# Patient Record
Sex: Male | Born: 1945
Health system: Southern US, Community
[De-identification: ages and names within clinical notes are randomized; demographics above are authoritative.]

## PROBLEM LIST (undated history)

## (undated) DIAGNOSIS — N138 Other obstructive and reflux uropathy: Secondary | ICD-10-CM

## (undated) DIAGNOSIS — J302 Other seasonal allergic rhinitis: Secondary | ICD-10-CM

## (undated) DIAGNOSIS — M1990 Unspecified osteoarthritis, unspecified site: Secondary | ICD-10-CM

## (undated) DIAGNOSIS — I493 Ventricular premature depolarization: Secondary | ICD-10-CM

## (undated) DIAGNOSIS — J449 Chronic obstructive pulmonary disease, unspecified: Secondary | ICD-10-CM

## (undated) DIAGNOSIS — I480 Paroxysmal atrial fibrillation: Secondary | ICD-10-CM

## (undated) DIAGNOSIS — Z974 Presence of external hearing-aid: Secondary | ICD-10-CM

## (undated) DIAGNOSIS — E669 Obesity, unspecified: Secondary | ICD-10-CM

## (undated) DIAGNOSIS — Z86010 Personal history of colonic polyps: Secondary | ICD-10-CM

## (undated) DIAGNOSIS — E538 Deficiency of other specified B group vitamins: Secondary | ICD-10-CM

## (undated) DIAGNOSIS — E785 Hyperlipidemia, unspecified: Secondary | ICD-10-CM

## (undated) DIAGNOSIS — H409 Unspecified glaucoma: Secondary | ICD-10-CM

## (undated) DIAGNOSIS — Z87891 Personal history of nicotine dependence: Secondary | ICD-10-CM

## (undated) DIAGNOSIS — I350 Nonrheumatic aortic (valve) stenosis: Secondary | ICD-10-CM

## (undated) DIAGNOSIS — I509 Heart failure, unspecified: Secondary | ICD-10-CM

## (undated) DIAGNOSIS — R319 Hematuria, unspecified: Secondary | ICD-10-CM

## (undated) DIAGNOSIS — N401 Enlarged prostate with lower urinary tract symptoms: Secondary | ICD-10-CM

## (undated) DIAGNOSIS — R001 Bradycardia, unspecified: Secondary | ICD-10-CM

## (undated) DIAGNOSIS — Z8669 Personal history of other diseases of the nervous system and sense organs: Secondary | ICD-10-CM

## (undated) DIAGNOSIS — I2510 Atherosclerotic heart disease of native coronary artery without angina pectoris: Secondary | ICD-10-CM

## (undated) DIAGNOSIS — E559 Vitamin D deficiency, unspecified: Secondary | ICD-10-CM

## (undated) DIAGNOSIS — G459 Transient cerebral ischemic attack, unspecified: Secondary | ICD-10-CM

## (undated) DIAGNOSIS — C4492 Squamous cell carcinoma of skin, unspecified: Secondary | ICD-10-CM

## (undated) DIAGNOSIS — R9431 Abnormal electrocardiogram [ECG] [EKG]: Secondary | ICD-10-CM

## (undated) DIAGNOSIS — Z85828 Personal history of other malignant neoplasm of skin: Secondary | ICD-10-CM

## (undated) DIAGNOSIS — G4733 Obstructive sleep apnea (adult) (pediatric): Secondary | ICD-10-CM

## (undated) DIAGNOSIS — I10 Essential (primary) hypertension: Secondary | ICD-10-CM

## (undated) DIAGNOSIS — R972 Elevated prostate specific antigen [PSA]: Secondary | ICD-10-CM

## (undated) DIAGNOSIS — Z9989 Dependence on other enabling machines and devices: Secondary | ICD-10-CM

## (undated) DIAGNOSIS — N400 Enlarged prostate without lower urinary tract symptoms: Secondary | ICD-10-CM

## (undated) DIAGNOSIS — I219 Acute myocardial infarction, unspecified: Secondary | ICD-10-CM

## (undated) DIAGNOSIS — J45909 Unspecified asthma, uncomplicated: Secondary | ICD-10-CM

## (undated) HISTORY — DX: Hematuria, unspecified: R31.9

## (undated) HISTORY — DX: Personal history of nicotine dependence: Z87.891

## (undated) HISTORY — DX: Abnormal electrocardiogram (ECG) (EKG): R94.31

## (undated) HISTORY — DX: Hyperlipidemia, unspecified: E78.5

## (undated) HISTORY — DX: Benign prostatic hyperplasia without lower urinary tract symptoms: N40.0

## (undated) HISTORY — DX: Vitamin D deficiency, unspecified: E55.9

## (undated) HISTORY — DX: Elevated prostate specific antigen (PSA): R97.20

## (undated) HISTORY — DX: Dependence on other enabling machines and devices: Z99.89

## (undated) HISTORY — DX: Obstructive sleep apnea (adult) (pediatric): G47.33

## (undated) HISTORY — DX: Obesity, unspecified: E66.9

## (undated) HISTORY — PX: COLONOSCOPY: SHX174

## (undated) HISTORY — DX: Essential (primary) hypertension: I10

## (undated) HISTORY — PX: CARDIAC CATHETERIZATION: SHX172

## (undated) HISTORY — PX: CATARACT EXTRACTION W/ INTRAOCULAR LENS IMPLANT: SHX1309

## (undated) HISTORY — DX: Personal history of colonic polyps: Z86.010

---
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
## 1998-08-30 ENCOUNTER — Inpatient Hospital Stay (HOSPITAL_COMMUNITY): Admission: EM | Admit: 1998-08-30 | Discharge: 1998-08-31 | Payer: Self-pay | Admitting: Emergency Medicine

## 1998-08-30 ENCOUNTER — Encounter: Payer: Self-pay | Admitting: Emergency Medicine

## 1998-08-31 ENCOUNTER — Encounter: Payer: Self-pay | Admitting: Pulmonary Disease

## 1999-08-19 HISTORY — PX: CHOLECYSTECTOMY: SHX55

## 1999-08-31 ENCOUNTER — Encounter: Payer: Self-pay | Admitting: Emergency Medicine

## 1999-08-31 ENCOUNTER — Encounter: Payer: Self-pay | Admitting: Rheumatology

## 1999-08-31 ENCOUNTER — Inpatient Hospital Stay (HOSPITAL_COMMUNITY): Admission: EM | Admit: 1999-08-31 | Discharge: 1999-09-05 | Payer: Self-pay | Admitting: Emergency Medicine

## 1999-12-14 ENCOUNTER — Encounter: Payer: Self-pay | Admitting: Emergency Medicine

## 1999-12-14 ENCOUNTER — Inpatient Hospital Stay (HOSPITAL_COMMUNITY): Admission: EM | Admit: 1999-12-14 | Discharge: 1999-12-16 | Payer: Self-pay | Admitting: Emergency Medicine

## 1999-12-15 ENCOUNTER — Encounter: Payer: Self-pay | Admitting: Rheumatology

## 2000-02-24 ENCOUNTER — Encounter: Payer: Self-pay | Admitting: Surgery

## 2000-02-26 ENCOUNTER — Encounter: Payer: Self-pay | Admitting: Surgery

## 2000-02-26 ENCOUNTER — Ambulatory Visit (HOSPITAL_COMMUNITY): Admission: RE | Admit: 2000-02-26 | Discharge: 2000-02-27 | Payer: Self-pay | Admitting: Surgery

## 2000-02-26 ENCOUNTER — Encounter (INDEPENDENT_AMBULATORY_CARE_PROVIDER_SITE_OTHER): Payer: Self-pay | Admitting: Specialist

## 2000-02-26 HISTORY — PX: LAPAROSCOPIC CHOLECYSTECTOMY: SUR755

## 2000-02-29 ENCOUNTER — Encounter: Payer: Self-pay | Admitting: General Surgery

## 2000-02-29 ENCOUNTER — Inpatient Hospital Stay (HOSPITAL_COMMUNITY): Admission: EM | Admit: 2000-02-29 | Discharge: 2000-03-04 | Payer: Self-pay | Admitting: *Deleted

## 2000-02-29 ENCOUNTER — Encounter: Payer: Self-pay | Admitting: *Deleted

## 2000-03-01 ENCOUNTER — Encounter: Payer: Self-pay | Admitting: General Surgery

## 2000-03-04 ENCOUNTER — Encounter: Payer: Self-pay | Admitting: Surgery

## 2003-03-08 ENCOUNTER — Encounter: Payer: Self-pay | Admitting: Internal Medicine

## 2003-03-08 ENCOUNTER — Encounter: Admission: RE | Admit: 2003-03-08 | Discharge: 2003-03-08 | Payer: Self-pay | Admitting: Internal Medicine

## 2006-07-27 ENCOUNTER — Emergency Department (HOSPITAL_COMMUNITY): Admission: EM | Admit: 2006-07-27 | Discharge: 2006-07-27 | Payer: Self-pay | Admitting: Emergency Medicine

## 2008-12-14 ENCOUNTER — Encounter: Payer: Self-pay | Admitting: Cardiology

## 2008-12-21 ENCOUNTER — Ambulatory Visit: Payer: Self-pay | Admitting: Cardiology

## 2008-12-21 DIAGNOSIS — E669 Obesity, unspecified: Secondary | ICD-10-CM | POA: Insufficient documentation

## 2008-12-21 DIAGNOSIS — E785 Hyperlipidemia, unspecified: Secondary | ICD-10-CM

## 2008-12-21 DIAGNOSIS — R9431 Abnormal electrocardiogram [ECG] [EKG]: Secondary | ICD-10-CM | POA: Insufficient documentation

## 2008-12-21 DIAGNOSIS — I10 Essential (primary) hypertension: Secondary | ICD-10-CM | POA: Insufficient documentation

## 2008-12-21 HISTORY — DX: Essential (primary) hypertension: I10

## 2008-12-21 HISTORY — DX: Hyperlipidemia, unspecified: E78.5

## 2008-12-28 ENCOUNTER — Ambulatory Visit: Payer: Self-pay

## 2008-12-28 ENCOUNTER — Ambulatory Visit: Payer: Self-pay | Admitting: Cardiology

## 2010-02-25 HISTORY — PX: LAPAROSCOPIC CHOLECYSTECTOMY: SUR755

## 2010-09-17 NOTE — Assessment & Plan Note (Signed)
Summary: np6/abnormal ekg/jml   Primary Provider:  Dr. Duanne Guess  CC:  Abnormal EKG.  History of Present Illness: The patient presents for evaluation of an abnormal EKG. He has no prior cardiac history he reports being hospitalized about 10 years ago for some atypical chest pain. He was told he had a large heart but does not describe his heart failure. He says he did not have a heart attack and he doesn't recall exactly but he says he thinks he had a stress test. He has had no problems since that time.  Recently he was on a cruise. He developed some nausea, was slightly overheated, had some abdominal discomfort and apparently had a frank syncopal episode. He said everything went gray and he went down. He did not have any palpitations or chest discomfort. Evaluated by the ship doctor.  He went to see Dr.Dewey recently and described this episode. An EKG did demonstrate rare PVCs and a possible old inferior infarct. There were no acute ST-T wave changes and there were no old EKGs for comparison.  The patient says that he feels relatively well. He can work in his yard and do aggressive activities such as cutting down trees without any chest pressure, neck or arm discomfort. He does not describe palpitations, presyncope or syncope. He has no PND or orthopnea. He has no excessive exercise and diaphoresis or nausea or vomiting.  Current Medications (verified): 1)  None  Allergies (verified): No Known Drug Allergies  Past History:  Past Medical History:    HTN x 2 years    Boderline Cholesterol  Past Surgical History:    Cholecystectomy  Family History:    No early heart disease. His 71 year old mother has hypertension and a 84 year old sister has hyperlipidemia.  Social History:    He is retired. He is married with 2 children. He smoked 2 packs per day for 60 years but quit in the 70s.  Review of Systems       As stated in the HPI and negative for all other systems.   Vital Signs:   Patient profile:   65 year old male Height:      70 inches Weight:      248 pounds BMI:     35.71 Pulse rate:   72 / minute Resp:     18 per minute BP sitting:   149 / 90  (right arm)  Nutrition Counseling: Patient's BMI is greater than 25 and therefore counseled on weight management options.  Physical Exam  General:  Well developed, well nourished, in no acute distress. Head:  normocephalic and atraumatic Eyes:  PERRLA/EOM intact; conjunctiva and lids normal. Mouth:  Teeth, gums and palate normal. Oral mucosa normal. Neck:  Neck supple, no JVD. No masses, thyromegaly or abnormal cervical nodes. Chest Wall:  no deformities or breast masses noted Lungs:  Clear bilaterally to auscultation and percussion. Abdomen:  Bowel sounds positive; abdomen soft and non-tender without masses, organomegaly, or hernias noted. No hepatosplenomegaly, obese Msk:  Back normal, normal gait. Muscle strength and tone normal. Pulses:  pulses normal in all 4 extremities Extremities:  No clubbing or cyanosis. Neurologic:  Alert and oriented x 3. Skin:  Intact without lesions or rashes. Cervical Nodes:  no significant adenopathy Axillary Nodes:  no significant adenopathy Inguinal Nodes:  no significant adenopathy Psych:  Normal affect.   Detailed Cardiovascular Exam  Neck    Carotids: Carotids full and equal bilaterally without bruits.      Neck Veins: Normal, no  JVD.    Heart    Inspection: no deformities or lifts noted.      Palpation: normal PMI with no thrills palpable.      Auscultation: regular rate and rhythm, S1, S2 without murmurs, rubs, gallops, or clicks.    Vascular    Abdominal Aorta: no palpable masses, pulsations, or audible bruits.      Femoral Pulses: normal femoral pulses bilaterally.      Pedal Pulses: normal pedal pulses bilaterally.      Radial Pulses: normal radial pulses bilaterally.      Peripheral Circulation: no clubbing, cyanosis, or edema noted with normal capillary  refill.     Impression & Recommendations:  Problem # 1:  ELECTROCARDIOGRAM, ABNORMAL (ICD-794.31) The patient does have an EKG which is nonspecific but could suggest an old inferior infarct. I think the pretest probability of coronary disease lobe. However, he does not have a particularly healthy lifestyle. He has cardiovascular risk factors. Screening with an exercise treadmill is prudent. This will allow me to exclude obstructive coronary disease, risk stratify and most importantly give him a prescription for exercise or prevented measures. Orders: Treadmill (Treadmill)  Problem # 2:  OBESITY, UNSPECIFIED (ICD-278.00) We discussed this and I will give him specific recommendations at the time of the treadmill.  Problem # 3:  ESSENTIAL HYPERTENSION, BENIGN (ICD-401.1) His blood pressure is slightly elevated. However, he was given a prescription for Micardis and he will start this.  Problem # 4:  HYPERLIPIDEMIA (ICD-272.4) Per Dr. Duanne Guess  Patient Instructions: 1)  Your physician has requested that you have an exercise tolerance test.  For further information please visit [email or web address]  Please also follow instruction sheet, as given. 2)  Your physician recommends that you continue on your current medications as directed. Please refer to the Current Medication list given to you today.

## 2010-09-17 NOTE — Letter (Signed)
Summary: [facility]  [facility]   Imported By: Sherian Rein 01/30/2009 08:06:41  _____________________________________________________________________  External Attachment:    Type:   Image     Comment:   External Document

## 2010-09-17 NOTE — Assessment & Plan Note (Signed)
Summary: [facility] W/Yasheka Fossett   Exercise Tolerance Test Cardiovascular Risk History:      Positive major cardiovascular risk factors include male age over 66 years old, hyperlipidemia, and hypertension.  Negative major cardiovascular risk factors include no history of diabetes, negative family history for ischemic heart disease, and non-tobacco-user status.        Further assessment for target organ damage reveals no history of ASHD, cardiac end-organ damage (CHF/LVH), stroke/TIA, peripheral vascular disease, renal insufficiency, or hypertensive retinopathy.    Baseline EKG:    Rhythm:     l sinus    Rate:       70    PR:       .15    QRS:       .09    QT:       .39    QTc:       .42  Exercise Tolerance Test Results:    Ordering MD:        Dr. Angelina Sheriff    Interpreting MD:     Dr. Angelina Sheriff    Indication for ETT:     abnormal ekg    Contraindication to ETT:   no    Stress Modality:     exercise-treadmill    Cardiac Imaging Performed:   none    Protocol:       Standard Bruce-maximal    Maximum BP:        211 / 83    MPHR (bpm):        157    85% MPHR (bpm):     133    MHR obtained (bpm):        148    Reached 85% MPHR       (min:sec):       5:40    Total Exercise Time       (min:sec):       7:30    Workload in METS:     9.3    Borg Scale:       14    ST Segment analysis:       At Rest:       normal ST segments-no evidence of significant ST depression       With Exercise:     no evidence of significant ST depression       Max. ST segment deviation          (during exercise or rest):   1 mm          Description:       upsloping          Leads:       inferior (II-III & AVF)    Arrhythmia:             yes    Arrhythmia description:   PVCs at peak exercise    Angina during ETT:     absent (0)  Cardiovascular Risk Assessment/Plan:      The patient's hypertensive risk group is category B: At least one risk factor (excluding diabetes) with no target organ damage.  Today's blood  pressure is 145/80.  His blood pressure goal is < 140/90.  Exercise Tolerance Test Assessment:    Quality of ETT:   diagnostic    ETT Interpretation:   normal-no evidence of ischemia by ST analysis    Comments:     The patient had a moderate exercise tolerance.  He had dyspnea but no chest pain.  There  few PVCs and couplets at peak exercise and during early recovery. There were no ischemic ST T wave changes.  He had an accelerated BP response.  He had a normal heart rate recovery.    Recommendations:   Negative adequate ETT.  No further cardiovascular testing is indicated.  He needs aggressive primary risk reduction.  Based on this test I gave him a presrciption for exercise.  He needs continued follow up of his BP per Dr. Duanne Guess.

## 2010-11-20 DIAGNOSIS — J309 Allergic rhinitis, unspecified: Secondary | ICD-10-CM | POA: Insufficient documentation

## 2010-11-20 DIAGNOSIS — E785 Hyperlipidemia, unspecified: Secondary | ICD-10-CM | POA: Insufficient documentation

## 2010-11-20 DIAGNOSIS — H9190 Unspecified hearing loss, unspecified ear: Secondary | ICD-10-CM | POA: Insufficient documentation

## 2010-11-26 DIAGNOSIS — E559 Vitamin D deficiency, unspecified: Secondary | ICD-10-CM

## 2010-11-26 HISTORY — DX: Vitamin D deficiency, unspecified: E55.9

## 2011-01-03 NOTE — H&P (Signed)
[place]. [facility]  Patient:    Eric Stokes                    MRN: [id number] Adm. Date:  [id number] Attending:  Genene Churn CC:         Darius Bump, M.D.                         History and Physical  HISTORY OF PRESENT ILLNESS:  The patient is a 65 year old male with a chief complaint of "severe stomach cramps," who is admitted with acute pancreatitis ?etiology for further evaluation and treatment.  PROBLEM: #1 - ACUTE PANCREATITIS ?ETIOLOGY:  On August 31, 1999, about 10:30 to 11 a.m., onset of abdominal cramps, constant upper mid abdomen, right and left upper abdomen.  Pain ranged between 5-9/10.  There was nausea and he vomited 15-20 times, initially food and Pepto-Bismol that he took and then clear material, no blood. No diarrhea.  Solid bowel movement at 0900 this morning that was brown.  He had belching.  He also took an Advil, no help.  No chest pain, shortness of breath, not dizzy.  Nonsmoker.  The patient called and I directed him to the [facility] ER initially to be seen by ER physician.  In the [facility] ER, amylase was 2142.  History of drinking alcohol socially, -2 beers every other day (had three beers on January 12 in the evening).  No history of gallstone or injury to the abdomen.  History of hyperlipidemia.  Patient was  given Demerol and Compazine in the ER and abdominal pain and nausea are improved with the pain now 5/10.  I elected to admit for further evaluation and treatment. Ultrasound of abdomen is to be done this evening.  No history of DTs.  No previous history of pancreatitis.  PAST MEDICAL HISTORY:  ALLERGIES:  None.  MEDICINES:  None.  OPERATIONS:  Status post tonsillectomy.  INJURIES:  Fractured toes and fingers with sports.  Has bruised left forehead from a crowbar (works [employer]), August 30, 1999.  MEDICAL: 1. History of hyperlipidemia. 2. History of hypertension under stress  and no treatment.  FAMILY/SOCIAL HISTORY:  Married, two children.  Wife is in emergency room.  One  child is an EMT in the ER, another is a Engineer, civil (consulting) in the ER.  Patient does not smoke, drinks beer socially.  Works Program[employer], [employer].  Family history of emphysema.  REVIEW OF SYSTEMS:  History of headache two days ago that resolved.  All other systems negative.  PHYSICAL EXAMINATION:  GENERAL:  Alert, no acute distress.  VITAL SIGNS:  I did O2 saturation, 97 on room air.  Temperature 97.3, initial blood pressure 162/82, pulse 82, respiratory rate 24.  SKIN:  Tattoo, right and left forearm, upper back, and left lower leg.  Skin was not sweaty and there was no rash.  HEENT:  Eyes:  Conjunctivae pink, sclerae white, pupils were very small.  They probably reacted to light, but could not see fundi well.  ENT remarkable for dry mucous membranes.  NECK:  Supple, thyroid not enlarged.  No bruit, no adenopathy.  LUNGS:  Clear to percussion and auscultation.  There was no CVA tenderness.  HEART:  Regular rhythm without murmur, gallop, or rub.  ABDOMEN:  Moderate tenderness, upper mid abdomen, right and left upper abdomen o palpation, but no rebound.  Bowel sounds normal active.  No gross organomegaly, was difficult to  tell because of tenderness in upper abdomen.  GENITOURINARY:  Normal male genitalia.  RECTAL:  Prostate not enlarged.  There was no stool in the vault.  Smudge on glove negative for blood.  EXTREMITIES:  No edema.  Pulses intact.  Joints:  No synovitis.  NEUROLOGICAL:  No focal neurological findings.  DATABASE:  Ultrasound of the abdomen, EKG, lipase, and PTT are pending.  White count 8500, hemoglobin 16.0, platelet count 195,000.  C-MET remarkable for glucose 126, SGOT 191, SGPT 138, total bilirubin 1.2.  Amylase 2142.  PT/INR 1.1.  IMPRESSION:  Acute pancreatitis ?etiology, rule out alcohol, rule out gallstone, rule out hyperlipidemia  ?other.  PLAN: 1. Admit, discuss with patient, wife, and two daughters. 2. N.p.o. and IV fluids. 3. Ultrasound of gallbladder, lipase, EKG, and PTT pending. 4. Demerol for pain and Phenergan for nausea.  Discussed may need NG tube if    has protracted vomiting. 5. On September 01, 1999, will get CBC, C-MET, amylase, lipase, and add magnesium,    cholesterol, and triglyceride.  See order sheet for details.  DD:  08/31/99 TD:  08/31/99 Job: [id number]

## 2011-01-03 NOTE — H&P (Signed)
[place]. [facility]  Patient:    Eric Stokes                    MRN: [id number] Adm. Date:  [id number] Attending:  Genene Churn                         History and Physical  ADDENDUM THREE-WAY ABDOMEN:  Bilateral atelectasis.  Distended small bowel on right, no air-fluid level, ? ileus or early obstruction. DD:  08/31/99 TD:  08/31/99 Job: [id number]

## 2011-01-03 NOTE — Discharge Summary (Signed)
[place]. [facility]  Patient:    Eric Stokes, Eric Stokes                   MRN: [id number] Adm. Date:  [id number] Disc. Date: [id number] Attending:  Glenna Fellows [place]                           Discharge Summary  ACCOUNT NUMBER [id number]  FINAL DIAGNOSES: 1. Bilious post laparoscopic cholecystectomy. 2. Cellulitis of right flank, superficial, questionable etiology. 3. Cellulitis of both upper extremities secondary to IVs.  CLINICAL HISTORY:  [name] is a 65 year old gentleman who had a prior history of pancreatitis thought to be biliary in origin.  He had done well and was admitted electively for laparoscopic cholecystectomy which was accomplished three days prior to this admission.  He did well and was discharged the next day.  He has returned and was admitted with abdominal distention, bloating, mild diffuse abdominal pain and cramping, some nausea and vomiting, and after admission, some diarrhea.  On exam, he had moderately distended abdomen.  His temperature was 100.  he had some diffuse tenderness with some guarding.  There was no evidence of any wound infections.  White count was 12,500.  Amylase and lipase were normal.  The patient was admitted with an initial diagnosis of ileus, rule out bile leak.  He began having frequent stools after he got in and passing gas, feeling better, and his HIDA scan was negative for leak.  The next day, he was feeling somewhat better, denying pain but still having multiple loose stools.  His white count had come down to 9200.  Abdominal films were consistent with an ileus.  Because of the diarrhea, C. difficile toxins were checked but were negative on final reports.  On July 16, he was feeling some better but continued to have a lot of liquid stools.  His abdomen was soft and nontender.  His wounds looked okay.  We elected, because he continued not to be well, to check a CT scan.  We gradually advanced  his diet on July 16 to full liquids.  The next day, his diarrhea was slowing down but still having frequent stools. His C. difficile had been negative x 3.  His potassium was 3.3.  It was felt that he had some form of an ileus resolving.  We tried him on a little cholestyramine which he could not tolerate because of the taste.  On July 17, he noticed some right upper extremity pain and redness and had an area where an IV had been placed a few days earlier.  Heat pack was applied. On July 17, he was feeling better, but at that point on exam, he had an area of erythema and induration on the right flank lateral to his more lateral trocar site.  CBC was negative and at that point got the CT scan which showed some mild inflammatory changes in the abdominal wall, uncertain etiology. There was no evidence of any intra-abdominal abscess or problem.  That evening, he felt okay and wanted to go home.  He was afebrile.  He was also developing an area on the left arm above an IV site that appeared to be developing some cellulitis/phlebitis.  The right flank had not gotten any worse, and it was decided at this point to discharge him on Augmentin 875 mg p.o. b.i.d., solid diet, activities as tolerated, and to follow up in my office in  a couple of days with monitoring at home of his fever and the areas of cellulitis.  His daughter was a Engineer, civil (consulting) and was well able to follow these, and they felt comfortable being discharged for outpatient management. DD:  03/19/00 TD:  03/21/00 Job: [id number]

## 2011-01-03 NOTE — H&P (Signed)
[place]. [facility]  Patient:    Eric Stokes                    MRN: [id number] Adm. Date:  [id number] Disc. Date: [id number] Attending:  Genene Churn CC:         Darius Bump, M.D.                         History and Physical  The patient is a 65 year old right-handed white male patient of Dr. Darius Bump.  CHIEF COMPLAINT:  "My upper stomach/pancreas."  Admitted with acute pancreatitis ?etiology for further evaluation and treatment.  PROBLEM: Acute pancreatitis ?etiology.  January 2001 patient admitted with acute pancreatitis.  Using alcohol socially at that time.  Ultrasound consistent with pancreatitis.  Abnormal liver tests. He has had no alcohol since that admission.  History of hyperlipidemia. Triglyceride 96 during that admission. Patient had mild hyperglycemia that subsequently returned to normal with a hemoglobin A1C later checked at 5.1.  On December 14, 1999 about 11 a.m. after eating breakfast he had a discomfort in the entire abdomen and the abdomen entire was bloated.  Took some Tums and there was less bloating.  Gas pain in his lower mid abdomen.  He had belching. He had a solid bowel movement earlier that morning that was brown.  He then developed pain in the upper mid abdomen.  Initially treated 4/10, then went to 6/10.  He had nausea and vomiting of beige material.  Patient called me after vomiting about four times and suggested coming to the emergency room but he declined.  He wanted to try Tums again which he did and vomited and subsequently came to the emergency room.  He vomited, he stated, about six times, the last about 5:30 p.m. which is about four hours ago.  In the emergency room temperature 97.8.  Blood pressure 107/61.  Pulse 84. Respiratory rate 28.  He was given morphine sulfate 4 mg with Phenergan 25 mg and subsequently 5 mg of morphine sulfate and has less pain now.  Still has some nausea, but feels  sluggish.  Amylase 107.3, lipase 1456, bilirubin 2.1, SGOT2 302, SGPT 211.  Patient was seen by Dr. Colon Branch and I was called and elected to admit for further evaluation of acute pancreatitis.  No injury to the abdomen.  No history of kidney stone.  No chest pain or shortness of breath.  Patient had normal calcium during January 2001 admission.  ALLERGIES:  None.  MEDICATIONS:  Allegra and Flonase.  PAST SURGICAL HISTORY:  Status post tonsillectomy.  INJURIES:  Status post fracture to toes and fingers various times, sports injuries.  Bruised forehead January 2001-crowbar.  PAST MEDICAL HISTORY: 1. Hyperlipidemia-diet. 2. Hypertension under stress, no treatment. 3. Status post acute pancreatitis January 2001.  See HPI. 4. History of allergies on Allegra and Flonase.  SOCIAL HISTORY:  Married.  Two children.  Does not smoke.  Does not drink alcohol.  Works in [employer].  FAMILY HISTORY:  Emphysema.  REVIEW OF SYSTEMS:  All other systems negative.  PHYSICAL EXAMINATION:  GENERAL:  Alert, but tired looking.  Had to receive morphine sulfate.  VITAL SIGNS:  Temperature 97.8.  Blood pressure 107/61.  Pulse 84. Respiratory rate 28.  Subsequent O2 saturation 97 per nurse.  SKIN:  Tattoos right and left arm and left lower extremity.  Not sweating.  No rash.  HEENT:  Eyes:  Conjunctiva:  Pink.  Sclerae:  White.  Pupils:  Small.  Left fundus:  Grossly normal.  I could not see right fundus well.  Ears: Otoscopic:  Normal.  Nose:  Nasal mucosa:  Normal.  Mouth and throat:  Dry mucous membranes.  LUNGS:  Clear to percussion and auscultation.  HEART:  Regular rhythm without murmur, gallop, or rub.  NECK:  Supple.  Thyroid not enlarged.  No bruit.  No CVA tenderness.  ABDOMEN:  Moderate tenderness mid epigastric greater in right and left upper quadrant.  Bowel sounds normoactive.  No gross organomegaly.  GENITOURINARY:  Normal male genitalia.  RECTAL:  Patient declines.   Recognizes I can not test for blood in stool (wanted to do this because of his abdominal pain, although he does not have a history of melena).  Will ask nurse to check for stool for blood is available.  EXTREMITIES:  No edema.  Pulses intact.  NEUROLOGIC:  No gross focal neurological findings.  JOINT:  No synovitis.  DATABASE:  One view abdomen:  Distended small bowel, right lower quadrant similar to prior study-probable ileus.  White count 12,800, hemoglobin 15.3.  PT and PTT normal.  CMET remarkable for glucose 120, SGOT 302, SGPT 211, total bilirubin 2.1, amylase 1073, lipase 1456.  IMPRESSION:  Acute pancreatitis ?etiology.  PLAN: 1. Admit.  Discussed with patient, wife, daughter, nurse. 2. Acute pancreatitis.  IV fluids, Demerol, n.p.o., Phenergan for nausea.    Ultrasound of gallbladder and pancreas.  Rule out gallbladder disease. 3. See order sheet for details. 4. EKG and urinalysis pending.  Ultrasound of abdomen pending. DD:  12/14/99 TD:  12/14/99 Job: [id number]

## 2011-01-03 NOTE — Op Note (Signed)
[place]  Patient:    PAXON, PROPES                   MRN: [id number] Proc. Date: 02/26/00 Adm. Date:  [id number] Attending:  Charlton Haws CC:         Darius Bump, M.D.             Roosvelt Harps, M.D.                           Operative Report  CCS#: [id number]  PREOPERATIVE DIAGNOSIS:  Chronic calculus, cholecystitis with history of recurring episodes of pancreatitis.  POSTOPERATIVE DIAGNOSIS:  Chronic calculus, cholecystitis with history of recurring episodes of pancreatitis.  OPERATION:  Laparoscopic cholecystectomy with operative cholangiogram.  SURGEON:  Currie Paris, M.D.  ASSISTANT:  Gita Kudo, M.D.  ANESTHESIA:  General endotracheal.  CLINICAL HISTORY:  This patient is a 65 year old man, who has had recurring episodes of pancreatitis and who has sludge in his gallbladder on ultrasound.  DESCRIPTION OF PROCEDURE:  Patient brought to the operating room and after satisfactory general endotracheal anesthesia had been obtained, the abdomen was prepped and draped.  Marcaine 0.25% was used at each incision.  An umbilical incision was made and the fascia picked up and the peritoneal cavity entered direct vision.  Hasson was introduced and the abdomen insufflated to 15.  The patient was placed in reverse Trendelenburg and three additional cannulae were placed in the usual positions.  There was a fair amount of fatty tissue over the gallbladder was dissected off until I could identify the cystic duct and the arch junction with the gallbladder.  I followed it down. There was a lot of fatty tissue overlying the common duct and was I was sure I was at the cystic duct/gallbladder junction I did not trace this any further. I opened up the peritoneum on either side to expose the Triangle of Calot a little bit better.  I could see one branch of the cystic artery sitting just behind the cystic duct and could see  that clearly no other structures in this area.  Cystic duct was clipped near the gallbladder and opened.  A 14 angiocath was used to placed a Redic catheter through the abdominal wall and an intraoperative cholangiography done.  There was prompt filling of the duodenum, good filling of the common duct and common hepatic duct and intrahepatic ducts and no evidence of any filling defects.  The pancreatic duct did fill a little bit.  The cystic duct catheter was removed and two clips were placed on the stay side and three on the first branch of the artery that we identified and cystic duct was divided and then the artery divided, leaving two clips on the stay side and one on the go side.  I little further dissection revealed a second artery, which was clipped and divided. Gallbladder was removed from below to above.  It was fairly thin-walled and distended and we did have a little bit of bile leakage out as we were working. Once the gallbladder was just about disconnected, we irrigated and made sure everything was dry along the bed of the gallbladder.  The gallbladder was then disconnected and put in a bag and brought out the umbilical port.  I had to decompress the gallbladder to get it out the umbilicus, but it came out readily.  The abdomen was reinsufflated and as the  umbilicus incision was digitally occluded, we irrigated with about a liter of saline to make sure all of the bile spillage had been diluted out and that there had been no bleeding.  The lateral ports were removed and there was no bleeding from those sites. The umbilical port was closed with a purse-string that had been placed at the beginning of the case.  The abdomen was deflated through the epigastric port. Skin incisions were closed with a 4-0 Monocryl subcuticular, plus Steri-Strips.  The patient tolerated the procedure well.  There were no operative complications.  All counts were correct. DD:  02/26/99 TD:   02/26/00 Job: [id number]

## 2011-01-03 NOTE — Discharge Summary (Signed)
[place]  Patient:    Eric Stokes, Eric Stokes                   MRN: [id number] Adm. Date:  [id number] Disc. Date: 12/16/99 Attending:  Genene Churn                           Discharge Summary  DATE OF BIRTH:  October 12, 1945  ADMITTING DIAGNOSES:  Acute pancreatitis.  DISCHARGE DIAGNOSES: 1. Acute pancreatitis. 2. Seasonal allergies.  HISTORY OF PRESENT ILLNESS AND HOSPITAL COURSE:  Patient is a 65 year old Caucasian male patient of Dr. Madison Hickman who was admitted December 14, 1999, following a history of developing abdominal pain and bloating late in the morning on admission.  Patient had taken some antacids with mild improvement in the bloating but, then followed with nausea and vomiting x 4 and increased abdominal pain for which she eventually presented to the emergency room. Laboratory data in the emergency room showed an amylase of 1073, lipase of 1456, bilirubin 2.1, AST 302, ALT 211.  Alkaline phosphatase was in the normal range.  Pro time was normal.  His white count was 12.8 with 89% neutrophils, 5% lymphs, 4% monos, hemoglobin 15.3, hematocrit 42.9, with a platelet count of 211.  A calcium was in the normal range at 8.9.  With regards to history, the patient had been admitted in January, 2001, with acute pancreatitis, with the concern for alcohol as the etiology.  He had, however, completely discontinued his alcohol use following that hospitalization and had gone on a low cholesterol diet for weight loss.  He states he has lost 20 pounds since his last admission.  He does have a history of hyperlipidemia.  However, triglyceride in January was 96.  PHYSICAL EXAMINATION:  VITAL SIGNS:  Stable.  Patient was afebrile.  ABDOMEN:  Exam showed moderate tenderness in the mid epigastric area, right more so than the left.  Bowel sounds were normoactive.  Abdominal films showed changes consistent with an adynamic ileus.  HOSPITAL  COURSE:  The patient was admitted, placed on IV fluids, received IV pain medication and antiemetics, made n.p.o., and hydrated.  With regards to his pancreatitis, he has improved rapidly over the ensuing two days.  He states, today, he is with minimal discomfort.  He has a significant appetite. He has tolerated clear liquids for the past 24 hours and is ready to go home. He has had no vomiting or nausea in the last 48 hours from what I can gather from his chart.  Amylase and lipase have also improved significantly.  Today, levels are at 417 and 400 respectively.  AST is now down to 40 and ALT has decreased to 115.  In evaluation of his pancreatitis, triglycerides returned at 59.  An ultrasound of the abdomen showed changes consistent with pancreatitis with an enlarged pancreas, also hepatic changes consistent with probable fatty liver disease, and no ductal dilatation.  Gallbladder appeared normal.  The patients diet has been advanced to regular low-cholesterol diet today and he has tolerated breakfast well, and is currently eating a good lunch without increased abdominal discomfort or nausea.  The patient states that he is not taking any other medications at home save for Allegra for his seasonal allergies.  The patient is thus discharged today in stable condition. He is to continue his Allegra 60 mg p.o. b.i.d. and Flonase two sprays each nostril once daily as needed for  allergies.  Otherwise, he is to call Dr. Ray Church office for follow-up in one to two weeks or sooner if he redevelops abdominal discomfort.  Again, it was stressed to him that he should continue to avoid alcohol and continue with his low-cholesterol diet and weight loss so as not to precipitate more frequent episodes.  At this time, his pancreatitis is deemed idiopathic. DD:  12/16/99 TD:  12/16/99 Job: [id number]

## 2011-09-16 DIAGNOSIS — R319 Hematuria, unspecified: Secondary | ICD-10-CM

## 2011-09-16 HISTORY — DX: Hematuria, unspecified: R31.9

## 2012-01-21 ENCOUNTER — Other Ambulatory Visit: Payer: Self-pay | Admitting: Dermatology

## 2012-05-31 ENCOUNTER — Other Ambulatory Visit: Payer: Self-pay | Admitting: Dermatology

## 2012-09-03 ENCOUNTER — Encounter: Payer: Self-pay | Admitting: Internal Medicine

## 2012-09-03 ENCOUNTER — Ambulatory Visit (INDEPENDENT_AMBULATORY_CARE_PROVIDER_SITE_OTHER): Payer: Medicare Other | Admitting: Internal Medicine

## 2012-09-03 VITALS — BP 142/76 | HR 69 | Temp 98.4°F | Resp 18 | Ht 69.0 in | Wt 244.0 lb

## 2012-09-03 DIAGNOSIS — N400 Enlarged prostate without lower urinary tract symptoms: Secondary | ICD-10-CM

## 2012-09-03 DIAGNOSIS — E785 Hyperlipidemia, unspecified: Secondary | ICD-10-CM

## 2012-09-03 DIAGNOSIS — Z860100 Personal history of colon polyps, unspecified: Secondary | ICD-10-CM

## 2012-09-03 DIAGNOSIS — Z0000 Encounter for general adult medical examination without abnormal findings: Secondary | ICD-10-CM

## 2012-09-03 DIAGNOSIS — N401 Enlarged prostate with lower urinary tract symptoms: Secondary | ICD-10-CM | POA: Insufficient documentation

## 2012-09-03 DIAGNOSIS — I10 Essential (primary) hypertension: Secondary | ICD-10-CM

## 2012-09-03 DIAGNOSIS — R972 Elevated prostate specific antigen [PSA]: Secondary | ICD-10-CM

## 2012-09-03 DIAGNOSIS — E669 Obesity, unspecified: Secondary | ICD-10-CM

## 2012-09-03 HISTORY — DX: Personal history of colon polyps, unspecified: Z86.0100

## 2012-09-03 HISTORY — DX: Personal history of colonic polyps: Z86.010

## 2012-09-03 HISTORY — DX: Benign prostatic hyperplasia without lower urinary tract symptoms: N40.0

## 2012-09-03 LAB — LIPID PANEL
Cholesterol: 225 mg/dL — ABNORMAL HIGH (ref 0–200)
HDL: 42.5 mg/dL (ref >39.00)
Total CHOL/HDL Ratio: 5
Triglycerides: 173 mg/dL — ABNORMAL HIGH (ref 0.0–149.0)
VLDL: 34.6 mg/dL (ref 0.0–40.0)

## 2012-09-03 LAB — CBC WITH DIFFERENTIAL/PLATELET
Basophils Absolute: 0.1 10*3/uL (ref 0.0–0.1)
Basophils Relative: 0.9 % (ref 0.0–3.0)
Eosinophils Absolute: 0.1 10*3/uL (ref 0.0–0.7)
Eosinophils Relative: 1.9 % (ref 0.0–5.0)
HCT: 42.8 % (ref 39.0–52.0)
Hemoglobin: 14.5 g/dL (ref 13.0–17.0)
Lymphocytes Relative: 29.4 % (ref 12.0–46.0)
Lymphs Abs: 1.7 10*3/uL (ref 0.7–4.0)
MCHC: 33.8 g/dL (ref 30.0–36.0)
MCV: 87 fl (ref 78.0–100.0)
Monocytes Absolute: 0.5 10*3/uL (ref 0.1–1.0)
Monocytes Relative: 8.3 % (ref 3.0–12.0)
Neutro Abs: 3.4 10*3/uL (ref 1.4–7.7)
Neutrophils Relative %: 59.5 % (ref 43.0–77.0)
Platelets: 205 10*3/uL (ref 150.0–400.0)
RBC: 4.92 Mil/uL (ref 4.22–5.81)
RDW: 13.5 % (ref 11.5–14.6)
WBC: 5.8 10*3/uL (ref 4.5–10.5)

## 2012-09-03 LAB — PSA: PSA: 2.64 ng/mL (ref 0.10–4.00)

## 2012-09-03 LAB — COMPREHENSIVE METABOLIC PANEL
ALT: 23 U/L (ref 0–53)
AST: 22 U/L (ref 0–37)
Albumin: 4.1 g/dL (ref 3.5–5.2)
Alkaline Phosphatase: 47 U/L (ref 39–117)
BUN: 20 mg/dL (ref 6–23)
CO2: 29 mEq/L (ref 19–32)
Calcium: 9.4 mg/dL (ref 8.4–10.5)
Chloride: 103 mEq/L (ref 96–112)
Creatinine, Ser: 0.9 mg/dL (ref 0.4–1.5)
GFR: 88.26 mL/min (ref >60.00)
Glucose, Bld: 98 mg/dL (ref 70–99)
Potassium: 4.3 mEq/L (ref 3.5–5.1)
Sodium: 138 mEq/L (ref 135–145)
Total Bilirubin: 1.2 mg/dL (ref 0.3–1.2)
Total Protein: 7.3 g/dL (ref 6.0–8.3)

## 2012-09-03 LAB — LDL CHOLESTEROL, DIRECT: Direct LDL: 146.7 mg/dL

## 2012-09-03 LAB — TSH: TSH: 1.3 u[IU]/mL (ref 0.35–5.50)

## 2012-09-03 MED ORDER — FLUTICASONE PROPIONATE 50 MCG/ACT NA SUSP: 2.0000 | Freq: Every day | NASAL | Status: DC | PRN

## 2012-09-03 NOTE — Patient Instructions (Signed)
Limit your sodium (Salt) intake    It is important that you exercise regularly, at least 20 minutes 3 to 4 times per week.  If you develop chest pain or shortness of breath seek  medical attention.  You need to lose weight.  Consider a lower calorie diet and regular exercise.  Return in one year for follow-up 

## 2012-09-03 NOTE — Progress Notes (Signed)
Subjective:    Patient ID: Eric Stokes, male    DOB: 1945/09/10, 67 y.o.   MRN: [id number]  HPI  67 year old patient who is seen today to establish with our practice. He has enjoyed excellent health in the past she has been treated briefly with lisinopril and atorvastatin for hypertension and hypercholesterolemia respectively. Presently he is off all medications. He has also been followed by urology due to 2 mild BPH and elevated PSA. This has normalized. He is scheduled for followup urology appointment this spring. He has a history of exogenous obesity He also has a history of an abnormal EKG suspicious for a prior inferior MI. He has had a nuclear medicine stress test in the past revealed no evidence of coronary artery disease and  No past medical history on file.  History   Social History  . Marital Status: Married    Spouse Name: N/A    Number of Children: N/A  . Years of Education: N/A   Occupational History  . Not on file.   Social History Main Topics  . Smoking status: Former Smoker    Types: Cigarettes    Quit date: 08/18/1965  . Smokeless tobacco: Never Used  . Alcohol Use: 1.2 - 3.6 oz/week    2-6 Cans of beer per week  . Drug Use: No  . Sexually Active: Not on file   Other Topics Concern  . Not on file   Social History Narrative  . No narrative on file    No past surgical history on file.  No family history on file.  Allergies  Allergen Reactions  . Lipitor (Atorvastatin) Other (See Comments)    Leg Cramps  . Lisinopril Cough    Current Outpatient Prescriptions on File Prior to Visit  Medication Sig Dispense Refill  . cetirizine (ZYRTEC) 10 MG tablet Take 10 mg by mouth daily as needed.      . fluticasone (FLONASE) 50 MCG/ACT nasal spray Place 2 sprays into the nose daily as needed.  16 g  6    BP 142/76  Pulse 69  Temp 98.4 F (36.9 C) (Oral)  Resp 18  Ht 5\' 9"  (1.753 m)  Wt 244 lb (110.678 kg)  BMI 36.03 kg/m2  SpO2 98%  Family  history father died at age 56 mother died at 49 father died of complications of COPD. One sister is in health Social history married 2 children and 3 grandchildren no history tobacco use since age 74 retired  1. Risk factors, based on past  M,S,F history cardiovascular as factors include a history of mild hypertension and dyslipidemia area now diet controlled.  2.  Physical activities:  Remains quite active with the aspects of daily living. He has been a member of the health club in the past but not since October of last year  3.  Depression/mood: No history depression or mood disorder  4.  Hearing: No deficits  5.  ADL's: Independent in all aspects of daily living  6.  Fall risk: Low  7.  Home safety: No problems identified  8.  Height weight, and visual acuity; height and weight stable no change in visual acuity  9.  Counseling: Modest weight loss and better eating habits encouraged. Low-salt diet recommended  10. Lab orders based on risk factors: Laboratory profile including lipid panel and PSA will be reviewed  11. Referral : Followup urology  12. Care plan: Followup colonoscopy in 2017  13. Cognitive assessment: Alert and oriented with normal affect.  No cognitive dysfunction        Review of Systems  Constitutional: Negative for fever, chills, appetite change and fatigue.  HENT: Negative for hearing loss, ear pain, congestion, sore throat, trouble swallowing, neck stiffness, dental problem, voice change and tinnitus.   Eyes: Negative for pain, discharge and visual disturbance.  Respiratory: Negative for cough, chest tightness, wheezing and stridor.   Cardiovascular: Negative for chest pain, palpitations and leg swelling.  Gastrointestinal: Negative for nausea, vomiting, abdominal pain, diarrhea, constipation, blood in stool and abdominal distention.  Genitourinary: Negative for urgency, hematuria, flank pain, discharge, difficulty urinating and genital sores.    Musculoskeletal: Negative for myalgias, back pain, joint swelling, arthralgias and gait problem.  Skin: Negative for rash.  Neurological: Negative for dizziness, syncope, speech difficulty, weakness, numbness and headaches.  Hematological: Negative for adenopathy. Does not bruise/bleed easily.  Psychiatric/Behavioral: Negative for behavioral problems and dysphoric mood. The patient is not nervous/anxious.        Objective:   Physical Exam  Constitutional: He appears well-developed and well-nourished.  HENT:  Head: Normocephalic and atraumatic.  Right Ear: External ear normal.  Left Ear: External ear normal.  Nose: Nose normal.  Mouth/Throat: Oropharynx is clear and moist.  Eyes: Conjunctivae normal and EOM are normal. Pupils are equal, round, and reactive to light. No scleral icterus.  Neck: Normal range of motion. Neck supple. No JVD present. No thyromegaly present.  Cardiovascular: Regular rhythm, normal heart sounds and intact distal pulses.  Exam reveals no gallop and no friction rub.   No murmur heard. Pulmonary/Chest: Effort normal and breath sounds normal. He exhibits no tenderness.  Abdominal: Soft. Bowel sounds are normal. He exhibits no distension and no mass. There is no tenderness.  Genitourinary: Penis normal.  Musculoskeletal: Normal range of motion. He exhibits no edema and no tenderness.  Lymphadenopathy:    He has no cervical adenopathy.  Neurological: He is alert. He has normal reflexes. No cranial nerve deficit. Coordination normal.  Skin: Skin is warm and dry. No rash noted.  Psychiatric: He has a normal mood and affect. His behavior is normal.          Assessment & Plan:   Preventive health exam History of hypertension. Now monitored off medication. Blood pressure today 140/76 History of mild dyslipidemia. Better diet weight loss exercise all encouraged. Will review a lipid profile   Followup urology Recheck 1 year

## 2012-09-24 ENCOUNTER — Telehealth: Payer: Self-pay | Admitting: *Deleted

## 2012-09-24 NOTE — Telephone Encounter (Signed)
Pt called for lab results from 09/03/2012. Told pt labs were normal except Chol and Triglycerides were elevated. Pt verbalized understanding. Told pt to continue low cholesterol diet, exercise. Pt verbalized understanding.

## 2013-01-24 ENCOUNTER — Other Ambulatory Visit: Payer: Self-pay | Admitting: Dermatology

## 2013-02-14 ENCOUNTER — Ambulatory Visit (INDEPENDENT_AMBULATORY_CARE_PROVIDER_SITE_OTHER): Payer: Medicare Other | Admitting: Internal Medicine

## 2013-02-14 ENCOUNTER — Encounter: Payer: Self-pay | Admitting: Internal Medicine

## 2013-02-14 VITALS — BP 150/80 | HR 76 | Temp 98.1°F | Resp 20 | Wt 238.0 lb

## 2013-02-14 DIAGNOSIS — E785 Hyperlipidemia, unspecified: Secondary | ICD-10-CM

## 2013-02-14 DIAGNOSIS — E669 Obesity, unspecified: Secondary | ICD-10-CM

## 2013-02-14 DIAGNOSIS — I10 Essential (primary) hypertension: Secondary | ICD-10-CM

## 2013-02-14 NOTE — Patient Instructions (Signed)
Limit your sodium (Salt) intake    It is important that you exercise regularly, at least 20 minutes 3 to 4 times per week.  If you develop chest pain or shortness of breath seek  medical attention.  Please check your blood pressure on a regular basis.  If it is consistently greater than 150/90, please make an office appointment.  Return in 6 months for follow-up   

## 2013-02-14 NOTE — Progress Notes (Signed)
Subjective:    Patient ID: Eric Stokes, male    DOB: 1945/11/06, 67 y.o.   MRN: [id number]  HPI 67 year old patient who is seen today for his second visit. He establish here about 6 months ago. He has a history of mild dyslipidemia and hypertension both of which are now controlled off medications. He continues to monitor home blood pressure readings with good results. He is followed by urology. He presents today with  2 pages of type II complaints concerning house related issues musculoskeletal concerns concerns about weight loss poor appetite and loss of muscle mass as well as concerns of ADD.  History reviewed. No pertinent past medical history.  History   Social History  . Marital Status: Married    Spouse Name: N/A    Number of Children: N/A  . Years of Education: N/A   Occupational History  . Not on file.   Social History Main Topics  . Smoking status: Former Smoker    Types: Cigarettes    Quit date: 08/18/1965  . Smokeless tobacco: Never Used  . Alcohol Use: 1.2 - 3.6 oz/week    2-6 Cans of beer per week  . Drug Use: No  . Sexually Active: Not on file   Other Topics Concern  . Not on file   Social History Narrative  . No narrative on file    History reviewed. No pertinent past surgical history.  No family history on file.  Allergies  Allergen Reactions  . Lipitor (Atorvastatin) Other (See Comments)    Leg Cramps  . Lisinopril Cough    Current Outpatient Prescriptions on File Prior to Visit  Medication Sig Dispense Refill  . cetirizine (ZYRTEC) 10 MG tablet Take 10 mg by mouth daily as needed.       No current facility-administered medications on file prior to visit.    BP 150/80  Pulse 76  Temp(Src) 98.1 F (36.7 C) (Oral)  Resp 20  Wt 238 lb (107.956 kg)  BMI 35.13 kg/m2  SpO2 97%       Review of Systems  Constitutional: Positive for appetite change. Negative for fever, chills and fatigue.  HENT: Positive for congestion and  postnasal drip. Negative for hearing loss, ear pain, sore throat, trouble swallowing, neck stiffness, dental problem, voice change and tinnitus.   Eyes: Negative for pain, discharge and visual disturbance.  Respiratory: Negative for cough, chest tightness, wheezing and stridor.   Cardiovascular: Negative for chest pain, palpitations and leg swelling.  Gastrointestinal: Positive for abdominal pain. Negative for nausea, vomiting, diarrhea, constipation, blood in stool and abdominal distention.  Genitourinary: Negative for urgency, hematuria, flank pain, discharge, difficulty urinating and genital sores.  Musculoskeletal: Positive for arthralgias. Negative for myalgias, back pain, joint swelling and gait problem.  Skin: Negative for rash.  Neurological: Negative for dizziness, syncope, speech difficulty, weakness, numbness and headaches.  Hematological: Negative for adenopathy. Does not bruise/bleed easily.  Psychiatric/Behavioral: Positive for decreased concentration. Negative for behavioral problems and dysphoric mood. The patient is not nervous/anxious.        Objective:   Physical Exam  Constitutional: He is oriented to person, place, and time. He appears well-developed.  Obese No distress Blood pressure 148/80  HENT:  Head: Normocephalic.  Right Ear: External ear normal.  Left Ear: External ear normal.  Eyes: Conjunctivae and EOM are normal.  Neck: Normal range of motion.  Cardiovascular: Normal rate and normal heart sounds.   Pulmonary/Chest: Breath sounds normal.  Abdominal: Bowel sounds are normal.  Musculoskeletal: Normal range of motion. He exhibits no edema and no tenderness.  Neurological: He is alert and oriented to person, place, and time.  Psychiatric: He has a normal mood and affect. His behavior is normal.          Assessment & Plan:   History of hypertension. We'll continue close home blood pressure monitoring. Exercise weight loss low salt diet all  recommended Dyslipidemia. We'll continue off statin therapy. Weight loss heart healthy diet encouraged Multiple somatic complaints  Followup urology Continue Zyrtec  CPX 6 months

## 2013-02-14 NOTE — Progress Notes (Signed)
  Subjective:    Patient ID: Eric Stokes, male    DOB: [birth year], 67 y.o.   MRN: [id number]  HPI  Wt Readings from Last 3 Encounters:  02/14/13 238 lb (107.956 kg)  09/03/12 244 lb (110.678 kg)  12/21/08 248 lb (112.492 kg)    Review of Systems     Objective:   Physical Exam        Assessment & Plan:

## 2013-06-27 ENCOUNTER — Emergency Department (HOSPITAL_BASED_OUTPATIENT_CLINIC_OR_DEPARTMENT_OTHER)
Admission: EM | Admit: 2013-06-27 | Discharge: 2013-06-27 | Disposition: A | Discharge status: Home / Self Care | Payer: Medicare Other | Attending: Emergency Medicine | Admitting: Emergency Medicine

## 2013-06-27 ENCOUNTER — Encounter (HOSPITAL_BASED_OUTPATIENT_CLINIC_OR_DEPARTMENT_OTHER): Payer: Self-pay | Admitting: Emergency Medicine

## 2013-06-27 DIAGNOSIS — H40052 Ocular hypertension, left eye: Secondary | ICD-10-CM

## 2013-06-27 DIAGNOSIS — H40819 Glaucoma with increased episcleral venous pressure, unspecified eye: Secondary | ICD-10-CM | POA: Insufficient documentation

## 2013-06-27 DIAGNOSIS — R51 Headache: Secondary | ICD-10-CM | POA: Insufficient documentation

## 2013-06-27 DIAGNOSIS — Z87891 Personal history of nicotine dependence: Secondary | ICD-10-CM | POA: Insufficient documentation

## 2013-06-27 MED ORDER — FLUORESCEIN SODIUM 1 MG OP STRP
1.0000 | ORAL_STRIP | Freq: Once | OPHTHALMIC | Status: AC
  Administered 2013-06-27: 1 via OPHTHALMIC

## 2013-06-27 MED ORDER — TETRACAINE HCL 0.5 % OP SOLN
1.0000 [drp] | Freq: Once | OPHTHALMIC | Status: AC
  Administered 2013-06-27: 2 [drp] via OPHTHALMIC

## 2013-06-27 NOTE — ED Notes (Addendum)
Possible exposure to chemical to left eye - reports fuzzy vision at present time. Pt reports increased headaches over past few weeks. Per pt, flushed left eye with water and visine s/p possible exposure. Neuro grossly intact

## 2013-06-27 NOTE — ED Provider Notes (Signed)
CSN: [id number]     Arrival date & time 06/27/13  9528 History   First MD Initiated Contact with Patient 06/27/13 1033     Chief Complaint  Patient presents with  . Eye Injury    left   (Consider location/radiation/quality/duration/timing/severity/associated sxs/prior Treatment) Patient is a 67 y.o. male presenting with eye injury. The history is provided by the patient.  Eye Injury This is a new problem. The current episode started today (started after working on floor using a cheimcal bonding compound. Does not recal chemical enering eye or rubbing eye.). The problem occurs constantly. The problem has been gradually improving. Associated symptoms include headaches and a visual change. Pertinent negatives include no abdominal pain, anorexia, arthralgias, change in bowel habit, chest pain, chills, congestion, coughing, diaphoresis, fever, nausea, numbness or vomiting. Nothing aggravates the symptoms. Treatments tried: irrigated and visine. The treatment provided mild relief.     History reviewed. No pertinent past medical history. History reviewed. No pertinent past surgical history. History reviewed. No pertinent family history. History  Substance Use Topics  . Smoking status: Former Smoker    Types: Cigarettes    Quit date: 08/18/1965  . Smokeless tobacco: Never Used  . Alcohol Use: 1.2 - 3.6 oz/week    2-6 Cans of beer per week    Review of Systems  Constitutional: Negative for fever, chills and diaphoresis.  HENT: Negative for congestion.   Respiratory: Negative for cough.   Cardiovascular: Negative for chest pain.  Gastrointestinal: Negative for nausea, vomiting, abdominal pain, anorexia and change in bowel habit.  Musculoskeletal: Negative for arthralgias.  Neurological: Positive for headaches. Negative for numbness.    Allergies  Lipitor and Lisinopril  Home Medications   Current Outpatient Rx  Name  Route  Sig  Dispense  Refill  . cetirizine (ZYRTEC) 10 MG  tablet   Oral   Take 10 mg by mouth daily as needed.         Marland Kitchen ibuprofen (ADVIL,MOTRIN) 200 MG tablet   Oral   Take 400 mg by mouth at bedtime.          BP 158/88  Pulse 69  Temp(Src) 98 F (36.7 C) (Oral)  Resp 20  SpO2 98% Physical Exam  Constitutional: He appears well-developed and well-nourished. No distress.  HENT:  Head: Normocephalic.  Mouth/Throat: Oropharynx is clear and moist. No oropharyngeal exudate.  Eyes: EOM and lids are normal. Right eye exhibits no chemosis, no discharge, no exudate and no hordeolum. No foreign body present in the right eye. Left eye exhibits no chemosis, no discharge, no exudate and no hordeolum. No foreign body present in the left eye. Right conjunctiva is not injected. Right conjunctiva has no hemorrhage. Left conjunctiva is injected. Left conjunctiva has no hemorrhage. Right eye exhibits normal extraocular motion and no nystagmus. Left eye exhibits normal extraocular motion and no nystagmus. Right pupil is round and reactive. Left pupil is round and reactive. Pupils are unequal.  Slit lamp exam:      The right eye shows no corneal abrasion, no corneal flare, no corneal ulcer, no foreign body, no hyphema, no hypopyon and no anterior chamber bulge.       The left eye shows corneal abrasion and corneal flare. The left eye shows no corneal ulcer, no foreign body, no hyphema, no hypopyon and no anterior chamber bulge.     Cornea has mild hazy appearance in left eye. Epithelial defect in left eye. IOP 60 OS, 23 OD. Pterygium on medial aspect of  left eye.  Skin: He is not diaphoretic.    ED Course  Procedures (including critical care time) Labs Review Labs Reviewed - No data to display Imaging Review No results found.  EKG Interpretation   None       MDM   1. Elevated IOP, left    The patient has IOP left eye of 60, decreased visual acuity and a mildly dilated left pupil.. This is likely due to chemical exposure given history. However,  other etiologies including angle closure glaucoma are unable to be ruled out at this time. I contacted the on call ophthalmologist's office. They agreed to see the patient immediately. I instructed the patient to go to Dr. Huel Coventry office immediately for further workup and therapy.    Pleas Koch, MD 06/27/13 [phone]

## 2013-06-28 NOTE — ED Provider Notes (Signed)
I saw and evaluated the patient, reviewed the resident's note and I agree with the findings and plan.   .Face to face Exam:  General:  Awake HEENT:  Atraumatic Resp:  Normal effort Abd:  Nondistended Neuro:No focal weakness  Nelia Shi, MD 06/28/13 2313

## 2013-08-29 ENCOUNTER — Ambulatory Visit: Payer: Medicare Other | Admitting: Internal Medicine

## 2013-09-06 ENCOUNTER — Encounter: Payer: Self-pay | Admitting: Internal Medicine

## 2013-09-06 ENCOUNTER — Ambulatory Visit (INDEPENDENT_AMBULATORY_CARE_PROVIDER_SITE_OTHER): Payer: Medicare Other | Admitting: Internal Medicine

## 2013-09-06 VITALS — BP 140/80

## 2013-09-06 DIAGNOSIS — Z23 Encounter for immunization: Secondary | ICD-10-CM

## 2013-09-06 DIAGNOSIS — Z86010 Personal history of colonic polyps: Secondary | ICD-10-CM

## 2013-09-06 DIAGNOSIS — I10 Essential (primary) hypertension: Secondary | ICD-10-CM

## 2013-09-06 DIAGNOSIS — E669 Obesity, unspecified: Secondary | ICD-10-CM

## 2013-09-06 DIAGNOSIS — Z0000 Encounter for general adult medical examination without abnormal findings: Secondary | ICD-10-CM

## 2013-09-06 DIAGNOSIS — E785 Hyperlipidemia, unspecified: Secondary | ICD-10-CM

## 2013-09-06 DIAGNOSIS — E559 Vitamin D deficiency, unspecified: Secondary | ICD-10-CM

## 2013-09-06 LAB — CBC WITH DIFFERENTIAL/PLATELET
Basophils Absolute: 0 10*3/uL (ref 0.0–0.1)
Basophils Relative: 0.5 % (ref 0.0–3.0)
Eosinophils Absolute: 0.1 10*3/uL (ref 0.0–0.7)
Eosinophils Relative: 2 % (ref 0.0–5.0)
HCT: 44.4 % (ref 39.0–52.0)
Hemoglobin: 15.1 g/dL (ref 13.0–17.0)
Lymphocytes Relative: 29.3 % (ref 12.0–46.0)
Lymphs Abs: 1.5 10*3/uL (ref 0.7–4.0)
MCHC: 33.9 g/dL (ref 30.0–36.0)
MCV: 87.9 fl (ref 78.0–100.0)
Monocytes Absolute: 0.4 10*3/uL (ref 0.1–1.0)
Monocytes Relative: 7.6 % (ref 3.0–12.0)
Neutro Abs: 3 10*3/uL (ref 1.4–7.7)
Neutrophils Relative %: 60.6 % (ref 43.0–77.0)
Platelets: 209 10*3/uL (ref 150.0–400.0)
RBC: 5.05 Mil/uL (ref 4.22–5.81)
RDW: 13.6 % (ref 11.5–14.6)
WBC: 5 10*3/uL (ref 4.5–10.5)

## 2013-09-06 LAB — LIPID PANEL
Cholesterol: 225 mg/dL — ABNORMAL HIGH (ref 0–200)
HDL: 44.5 mg/dL (ref >39.00)
Total CHOL/HDL Ratio: 5
Triglycerides: 134 mg/dL (ref 0.0–149.0)
VLDL: 26.8 mg/dL (ref 0.0–40.0)

## 2013-09-06 LAB — COMPREHENSIVE METABOLIC PANEL
ALT: 17 U/L (ref 0–53)
AST: 17 U/L (ref 0–37)
Albumin: 4.2 g/dL (ref 3.5–5.2)
Alkaline Phosphatase: 41 U/L (ref 39–117)
BUN: 23 mg/dL (ref 6–23)
CO2: 27 mEq/L (ref 19–32)
Calcium: 9.1 mg/dL (ref 8.4–10.5)
Chloride: 105 mEq/L (ref 96–112)
Creatinine, Ser: 0.9 mg/dL (ref 0.4–1.5)
GFR: 88 mL/min (ref >60.00)
Glucose, Bld: 94 mg/dL (ref 70–99)
Potassium: 4.4 mEq/L (ref 3.5–5.1)
Sodium: 139 mEq/L (ref 135–145)
Total Bilirubin: 1 mg/dL (ref 0.3–1.2)
Total Protein: 7.5 g/dL (ref 6.0–8.3)

## 2013-09-06 LAB — TSH: TSH: 1.83 u[IU]/mL (ref 0.35–5.50)

## 2013-09-06 LAB — LDL CHOLESTEROL, DIRECT: Direct LDL: 160.8 mg/dL

## 2013-09-06 MED ORDER — FLUTICASONE PROPIONATE 50 MCG/ACT NA SUSP: 2.0000 | Freq: Every day | NASAL | Status: DC | PRN

## 2013-09-06 NOTE — Progress Notes (Signed)
Subjective:    Patient ID: Eric Stokes, male    DOB: Mar 03, 1946, 68 y.o.   MRN: [id number]  HPI 68 year-old patient who is seen today for an annual exam. He has enjoyed excellent health;  in the past he has been treated briefly with lisinopril and atorvastatin for hypertension and hypercholesterolemia respectively. Presently he is off all medications. He has also been followed by urology due to  mild BPH and elevated PSA. This has normalized. He is scheduled for followup urology appointment this spring. He has a history of exogenous obesity He also has a history of an abnormal EKG suspicious for a prior inferior MI. He has had a nuclear medicine stress test in the past revealed no evidence of coronary artery disease. More recently he presented to the emergency department due to visual loss on the left. He is now being treated for glaucoma and has done well. He is followed closely by urology His last colonoscopy was about 2 years ago by Dr. Damaris Schooner Readings from Last 3 Encounters:  02/14/13 238 lb (107.956 kg)  09/03/12 244 lb (110.678 kg)  12/21/08 248 lb (112.492 kg)    History reviewed. No pertinent past medical history.  History   Social History  . Marital Status: Married    Spouse Name: N/A    Number of Children: N/A  . Years of Education: N/A   Occupational History  . Not on file.   Social History Main Topics  . Smoking status: Former Smoker    Types: Cigarettes    Quit date: 08/18/1965  . Smokeless tobacco: Never Used  . Alcohol Use: 1.2 - 3.6 oz/week    2-6 Cans of beer per week  . Drug Use: No  . Sexual Activity: Not on file   Other Topics Concern  . Not on file   Social History Narrative  . No narrative on file    History reviewed. No pertinent past surgical history.  No family history on file.  Allergies  Allergen Reactions  . Lipitor [Atorvastatin] Other (See Comments)    Leg Cramps  . Lisinopril Cough    Current Outpatient Prescriptions on  File Prior to Visit  Medication Sig Dispense Refill  . cetirizine (ZYRTEC) 10 MG tablet Take 10 mg by mouth daily as needed.      Marland Kitchen ibuprofen (ADVIL,MOTRIN) 200 MG tablet Take 400 mg by mouth at bedtime.       No current facility-administered medications on file prior to visit.    BP 140/80  Family history father died at age 47 mother died at 36 father died of complications of COPD. One sister is in health Social history married 2 children and 3 grandchildren no history tobacco use since age 68 retired  58. Risk factors, based on past  M,S,F history cardiovascular as factors include a history of mild hypertension and dyslipidemia area now diet controlled.  2.  Physical activities:  Remains quite active with the aspects of daily living. He has been a member of the health club in the past but not since October of last year  3.  Depression/mood: No history depression or mood disorder  4.  Hearing: Now uses hearing aids  5.  ADL's: Independent in all aspects of daily living  6.  Fall risk: Low  7.  Home safety: No problems identified  8.  Height weight, and visual acuity; height and weight stable no change in visual acuity  9.  Counseling: Modest weight loss and better  eating habits encouraged. Low-salt diet recommended  10. Lab orders based on risk factors: Laboratory profile including lipid panel and PSA will be reviewed  11. Referral : Followup urology  12. Care plan: Followup colonoscopy in 2017  13. Cognitive assessment: Alert and oriented with normal affect. No cognitive dysfunction        Review of Systems  Constitutional: Negative for fever, chills, appetite change and fatigue.  HENT: Negative for congestion, dental problem, ear pain, hearing loss, sore throat, tinnitus, trouble swallowing and voice change.   Eyes: Negative for pain, discharge and visual disturbance.  Respiratory: Negative for cough, chest tightness, wheezing and stridor.   Cardiovascular:  Negative for chest pain, palpitations and leg swelling.  Gastrointestinal: Negative for nausea, vomiting, abdominal pain, diarrhea, constipation, blood in stool and abdominal distention.  Genitourinary: Negative for urgency, hematuria, flank pain, discharge, difficulty urinating and genital sores.  Musculoskeletal: Negative for arthralgias, back pain, gait problem, joint swelling, myalgias and neck stiffness.  Skin: Negative for rash.  Neurological: Negative for dizziness, syncope, speech difficulty, weakness, numbness and headaches.  Hematological: Negative for adenopathy. Does not bruise/bleed easily.  Psychiatric/Behavioral: Negative for behavioral problems and dysphoric mood. The patient is not nervous/anxious.        Objective:   Physical Exam  Constitutional: He appears well-developed and well-nourished.  HENT:  Head: Normocephalic and atraumatic.  Right Ear: External ear normal.  Left Ear: External ear normal.  Nose: Nose normal.  Mouth/Throat: Oropharynx is clear and moist.  Eyes: Conjunctivae and EOM are normal. Pupils are equal, round, and reactive to light. No scleral icterus.  Neck: Normal range of motion. Neck supple. No JVD present. No thyromegaly present.  Cardiovascular: Regular rhythm, normal heart sounds and intact distal pulses.  Exam reveals no gallop and no friction rub.   No murmur heard. Pulmonary/Chest: Effort normal and breath sounds normal. He exhibits no tenderness.  Abdominal: Soft. Bowel sounds are normal. He exhibits no distension and no mass. There is no tenderness.  Genitourinary: Penis normal.  Musculoskeletal: Normal range of motion. He exhibits no edema and no tenderness.  Lymphadenopathy:    He has no cervical adenopathy.  Neurological: He is alert. He has normal reflexes. No cranial nerve deficit. Coordination normal.  Skin: Skin is warm and dry. No rash noted.  Psychiatric: He has a normal mood and affect. His behavior is normal.           Assessment & Plan:   Preventive health exam History of hypertension. Now monitored off medication. Blood pressure today 140/76 History of mild dyslipidemia. Better diet weight loss exercise all encouraged. Will review a lipid profile   Followup urology Recheck 1 year

## 2013-09-06 NOTE — Patient Instructions (Signed)
Limit your sodium (Salt) intake  Please check your blood pressure on a regular basis.  If it is consistently greater than 150/90, please make an office appointment.    It is important that you exercise regularly, at least 20 minutes 3 to 4 times per week.  If you develop chest pain or shortness of breath seek  medical attention.  You need to lose weight.  Consider a lower calorie diet and regular exercise.  Followup urology and ophthalmology  Return in one year for follow-up

## 2013-09-07 LAB — VITAMIN D 25 HYDROXY (VIT D DEFICIENCY, FRACTURES): Vit D, 25-Hydroxy: 33 ng/mL (ref 30–89)

## 2013-09-21 ENCOUNTER — Telehealth: Payer: Self-pay | Admitting: Internal Medicine

## 2013-09-21 NOTE — Telephone Encounter (Signed)
Relevant patient education mailed to patient.  

## 2014-01-04 ENCOUNTER — Telehealth: Payer: Self-pay | Admitting: Internal Medicine

## 2014-01-04 NOTE — Telephone Encounter (Signed)
Patient Information:  Caller Name: [name]  Phone: [phone]  Patient: Eric Stokes, Eric Stokes  Gender: Male  DOB: 12-Oct-1945  Age: 68 Years  PCP: Bluford Kaufmann (Family Practice > 80yrs old)  Office Follow Up:  Does the office need to follow up with this patient?: Yes  Instructions For The Office: Patient is currently on an [place] and will be returning 01/04/14 "evening". Daughter states she would like to arrange for patient to have a carotid doppler screening and then schedule an appt. with Dr. Burnice Logan. Daughter states she feels that patient will be more receptive if testing is scheduled followed by appt. Please return call to Daughter/Cathy at [phone] with recommendation per Dr. Burnice Logan.  RN Note:  Daughter states she is a Engineer, mining. States patient developed exertional Dyspnea, onset X 2 weeks. States exertional dyspnea was accompanied by diaphoresis and nausea. States Dyspnea resolves with rest. No complaints of chest pain. Daughter states patient has been out of the country since 12/21/13 and will be returning from trip 01/04/14 "evening". Daughter states that when she lisented to patient's chest she could hear a loud bounding heartbeat and see a pulsatile right anterior chest wall. Daughter also states she could hear a loud right carotid bruit. Daughter states patient declined to be evaluated prior to leaving on his trip. Denies extremity swelling. Care advice given per guidelines. Call back parameters reviewed. Daughter verbalizes understanding.  Daughter states she would like to arrange for patient to have a carotid doppler screening and then schedule an appt. with Dr. Burnice Logan. Daughter states she feels that patient will be more receptive if testing is scheduled followed by appt. Please return call to Daughter/Cathy at [phone] with recommendation per Dr. Burnice Logan.  Symptoms  Reason For Call & Symptoms: Shortness of Breath  Reviewed Health History  In EMR: Yes  Reviewed Medications In EMR: Yes  Reviewed Allergies In EMR: Yes  Reviewed Surgeries / Procedures: Yes  Date of Onset of Symptoms: 12/21/2013  Guideline(s) Used:  Breathing Difficulty  Disposition Per Guideline:   Go to Office Now  Reason For Disposition Reached:   Mild difficulty breathing (e.g., minimal/no SOB at rest, SOB with walking, pulse < 100) of new onset or worse than normal  Advice Given:  Call Back If:  Severe difficulty breathing occurs  You become worse.  Patient Refused Recommendation:  Patient Refused Appt, Patient Requests Appt At Later Date  Patient is currently on an [place] and will be returning 01/04/14 "evening". Daughter states she would like to arrange for patient to have a carotid doppler screening and then schedule an appt. with Dr. Burnice Logan. Daughter states she feels that patient will be more receptive if testing is scheduled followed by appt. Please return call to Daughter/Cathy at [phone] with recommendation per Dr. Burnice Logan.

## 2014-02-13 ENCOUNTER — Encounter: Payer: Self-pay | Admitting: Internal Medicine

## 2014-02-13 ENCOUNTER — Ambulatory Visit (INDEPENDENT_AMBULATORY_CARE_PROVIDER_SITE_OTHER): Payer: Medicare Other | Admitting: Internal Medicine

## 2014-02-13 VITALS — BP 156/80 | HR 74 | Temp 98.1°F | Resp 20 | Ht 69.0 in | Wt 242.0 lb

## 2014-02-13 DIAGNOSIS — E669 Obesity, unspecified: Secondary | ICD-10-CM

## 2014-02-13 DIAGNOSIS — I10 Essential (primary) hypertension: Secondary | ICD-10-CM

## 2014-02-13 DIAGNOSIS — E785 Hyperlipidemia, unspecified: Secondary | ICD-10-CM

## 2014-02-13 DIAGNOSIS — R0989 Other specified symptoms and signs involving the circulatory and respiratory systems: Secondary | ICD-10-CM

## 2014-02-13 NOTE — Patient Instructions (Signed)
Limit your sodium (Salt) intake    It is important that you exercise regularly, at least 20 minutes 3 to 4 times per week.  If you develop chest pain or shortness of breath seek  medical attention.  You need to lose weight.  Consider a lower calorie diet and regular exercise.    DASH Eating Plan DASH stands for "Dietary Approaches to Stop Hypertension." The DASH eating plan is a healthy eating plan that has been shown to reduce high blood pressure (hypertension). Additional health benefits may include reducing the risk of type 2 diabetes mellitus, heart disease, and stroke. The DASH eating plan may also help with weight loss. WHAT DO I NEED TO KNOW ABOUT THE DASH EATING PLAN? For the DASH eating plan, you will follow these general guidelines:  Choose foods with a percent daily value for sodium of less than 5% (as listed on the food label).  Use salt-free seasonings or herbs instead of table salt or sea salt.  Check with your health care provider or pharmacist before using salt substitutes.  Eat lower-sodium products, often labeled as "lower sodium" or "no salt added."  Eat fresh foods.  Eat more vegetables, fruits, and low-fat dairy products.  Choose whole grains. Look for the word "whole" as the first word in the ingredient list.  Choose fish and skinless chicken or [place] more often than red meat. Limit fish, poultry, and meat to 6 oz (170 g) each day.  Limit sweets, desserts, sugars, and sugary drinks.  Choose heart-healthy fats.  Limit cheese to 1 oz (28 g) per day.  Eat more home-cooked food and less restaurant, buffet, and fast food.  Limit fried foods.  Cook foods using methods other than frying.  Limit canned vegetables. If you do use them, rinse them well to decrease the sodium.  When eating at a restaurant, ask that your food be prepared with less salt, or no salt if possible. WHAT FOODS CAN I EAT? Seek help from a dietitian for individual calorie  needs. Grains Whole grain or whole wheat bread. Brown rice. Whole grain or whole wheat pasta. Quinoa, bulgur, and whole grain cereals. Low-sodium cereals. Corn or whole wheat flour tortillas. Whole grain cornbread. Whole grain crackers. Low-sodium crackers. Vegetables Fresh or frozen vegetables (raw, steamed, roasted, or grilled). Low-sodium or reduced-sodium tomato and vegetable juices. Low-sodium or reduced-sodium tomato sauce and paste. Low-sodium or reduced-sodium canned vegetables.  Fruits All fresh, canned (in natural juice), or frozen fruits. Meat and Other Protein Products Ground beef (85% or leaner), grass-fed beef, or beef trimmed of fat. Skinless chicken or [place]. Ground chicken or [place]. Pork trimmed of fat. All fish and seafood. Eggs. Dried beans, peas, or lentils. Unsalted nuts and seeds. Unsalted canned beans. Dairy Low-fat dairy products, such as skim or 1% milk, 2% or reduced-fat cheeses, low-fat ricotta or cottage cheese, or plain low-fat yogurt. Low-sodium or reduced-sodium cheeses. Fats and Oils Tub margarines without trans fats. Light or reduced-fat mayonnaise and salad dressings (reduced sodium). Avocado. Safflower, olive, or canola oils. Natural peanut or almond butter. Other Unsalted popcorn and pretzels. The items listed above may not be a complete list of recommended foods or beverages. Contact your dietitian for more options. WHAT FOODS ARE NOT RECOMMENDED? Grains White bread. White pasta. White rice. Refined cornbread. Bagels and croissants. Crackers that contain trans fat. Vegetables Creamed or fried vegetables. Vegetables in a cheese sauce. Regular canned vegetables. Regular canned tomato sauce and paste. Regular tomato and vegetable juices. Fruits Dried fruits.  Canned fruit in light or heavy syrup. Fruit juice. Meat and Other Protein Products Fatty cuts of meat. Ribs, chicken wings, bacon, sausage, bologna, salami, chitterlings, fatback, hot dogs, bratwurst,  and packaged luncheon meats. Salted nuts and seeds. Canned beans with salt. Dairy Whole or 2% milk, cream, half-and-half, and cream cheese. Whole-fat or sweetened yogurt. Full-fat cheeses or blue cheese. Nondairy creamers and whipped toppings. Processed cheese, cheese spreads, or cheese curds. Condiments Onion and garlic salt, seasoned salt, table salt, and sea salt. Canned and packaged gravies. Worcestershire sauce. Tartar sauce. Barbecue sauce. Teriyaki sauce. Soy sauce, including reduced sodium. Steak sauce. Fish sauce. Oyster sauce. Cocktail sauce. Horseradish. Ketchup and mustard. Meat flavorings and tenderizers. Bouillon cubes. Hot sauce. Tabasco sauce. Marinades. Taco seasonings. Relishes. Fats and Oils Butter, stick margarine, lard, shortening, ghee, and bacon fat. Coconut, palm kernel, or palm oils. Regular salad dressings. Other Pickles and olives. Salted popcorn and pretzels. The items listed above may not be a complete list of foods and beverages to avoid. Contact your dietitian for more information. WHERE CAN I FIND MORE INFORMATION? National Heart, Lung, and Blood Institute: travelstabloid.com Document Released: 07/24/2011 Document Revised: 08/09/2013 Document Reviewed: 06/08/2013 [facility] Patient Information 2015 [place], [place]. This information is not intended to replace advice given to you by your health care provider. Make sure you discuss any questions you have with your health care provider.

## 2014-02-13 NOTE — Progress Notes (Signed)
Subjective:    Patient ID: Eric Stokes, male    DOB: 07-Dec-1945, 68 y.o.   MRN: [id number]  HPI  68 year old patient who has a prior history of essential hypertension.  He has been to his lisinopril but developed a cough and more recently, losartan.  This was tapered and finally discontinued due to some low blood pressure readings.  He basically has been watched off medications.  He is seen here accompanied by his daughter, who is an Therapist, sports.  She was concerned about a carotid bruit and also symptoms of fatigue.  He does monitor home blood pressure readings closely and they're quite labile and often in the low normal range, but occasionally has a slightly elevated systolic readings.  Diastolic readings are always less than 90. Intermittent complaints include fatigue, and headaches.  Daughter was concerned about these being related to poorly controlled hypertension.  He also has a history of dyslipidemia and has been on atorvastatin in the past.  Apparently, he developed some leg cramps, and this was discontinued.  His present lipid profile and CHD, the risk of 60%.  Discussed.  He wishes to resume statin therapy  History reviewed. No pertinent past medical history.  History   Social History  . Marital Status: Married    Spouse Name: N/A    Number of Children: N/A  . Years of Education: N/A   Occupational History  . Not on file.   Social History Main Topics  . Smoking status: Former Smoker    Types: Cigarettes    Quit date: 08/18/1965  . Smokeless tobacco: Never Used  . Alcohol Use: 1.2 - 3.6 oz/week    2-6 Cans of beer per week  . Drug Use: No  . Sexual Activity: Not on file   Other Topics Concern  . Not on file   Social History Narrative  . No narrative on file    History reviewed. No pertinent past surgical history.  No family history on file.  Allergies  Allergen Reactions  . Lipitor [Atorvastatin] Other (See Comments)    Leg Cramps  . Lisinopril Cough     Current Outpatient Prescriptions on File Prior to Visit  Medication Sig Dispense Refill  . cetirizine (ZYRTEC) 10 MG tablet Take 10 mg by mouth daily as needed.      Marland Kitchen ibuprofen (ADVIL,MOTRIN) 200 MG tablet Take 400 mg by mouth at bedtime.      . fluticasone (FLONASE) 50 MCG/ACT nasal spray Place 2 sprays into both nostrils daily as needed.  16 g  6   No current facility-administered medications on file prior to visit.    BP 156/80  Pulse 74  Temp(Src) 98.1 F (36.7 C) (Oral)  Resp 20  Ht 5\' 9"  (1.753 m)  Wt 242 lb (109.77 kg)  BMI 35.72 kg/m2  SpO2 96%       Review of Systems  Constitutional: Positive for fatigue. Negative for fever, chills and appetite change.  HENT: Negative for congestion, dental problem, ear pain, hearing loss, sore throat, tinnitus, trouble swallowing and voice change.   Eyes: Negative for pain, discharge and visual disturbance.  Respiratory: Negative for cough, chest tightness, wheezing and stridor.   Cardiovascular: Negative for chest pain, palpitations and leg swelling.  Gastrointestinal: Negative for nausea, vomiting, abdominal pain, diarrhea, constipation, blood in stool and abdominal distention.  Genitourinary: Negative for urgency, hematuria, flank pain, discharge, difficulty urinating and genital sores.  Musculoskeletal: Negative for arthralgias, back pain, gait problem, joint swelling, myalgias and  neck stiffness.  Skin: Negative for rash.  Neurological: Positive for headaches. Negative for dizziness, syncope, speech difficulty, weakness and numbness.  Hematological: Negative for adenopathy. Does not bruise/bleed easily.  Psychiatric/Behavioral: Negative for behavioral problems and dysphoric mood. The patient is not nervous/anxious.        Objective:   Physical Exam  Constitutional: He is oriented to person, place, and time. He appears well-developed.  HENT:  Head: Normocephalic.  Right Ear: External ear normal.  Left Ear: External  ear normal.  Eyes: Conjunctivae and EOM are normal.  Neck: Normal range of motion.  No bruit.  Normal carotid upstroke  Cardiovascular: Normal rate, normal heart sounds and intact distal pulses.   Pulmonary/Chest: Breath sounds normal.  Abdominal: Bowel sounds are normal.  Musculoskeletal: Normal range of motion. He exhibits no edema and no tenderness.  Neurological: He is alert and oriented to person, place, and time.  Psychiatric: He has a normal mood and affect. His behavior is normal.          Assessment & Plan:   History of hypertension.  Episodic, mild systolic elevations.  Will observe off therapy and work on lifestyle changes with weight loss, low salt diet and DASH diet Mild dyslipidemia with elevated cardiac heart disease risk profile.  Risk and benefits of therapy discussed.  Patient will resume statin therapy with pravastatin Fatigue Headaches  Recheck 3 months

## 2014-02-13 NOTE — Progress Notes (Signed)
Pre-visit discussion using our clinic review tool. No additional management support is needed unless otherwise documented below in the visit note.  

## 2014-02-14 ENCOUNTER — Telehealth: Payer: Self-pay | Admitting: Internal Medicine

## 2014-02-14 NOTE — Telephone Encounter (Signed)
Relevant patient education assigned to patient using Emmi. ° °

## 2014-02-16 ENCOUNTER — Telehealth: Payer: Self-pay | Admitting: Internal Medicine

## 2014-02-16 ENCOUNTER — Ambulatory Visit (HOSPITAL_COMMUNITY): Payer: Medicare Other | Attending: Cardiology | Admitting: Cardiology

## 2014-02-16 DIAGNOSIS — E785 Hyperlipidemia, unspecified: Secondary | ICD-10-CM | POA: Insufficient documentation

## 2014-02-16 DIAGNOSIS — R0989 Other specified symptoms and signs involving the circulatory and respiratory systems: Secondary | ICD-10-CM

## 2014-02-16 DIAGNOSIS — I6529 Occlusion and stenosis of unspecified carotid artery: Secondary | ICD-10-CM

## 2014-02-16 DIAGNOSIS — I10 Essential (primary) hypertension: Secondary | ICD-10-CM | POA: Insufficient documentation

## 2014-02-16 DIAGNOSIS — Z87891 Personal history of nicotine dependence: Secondary | ICD-10-CM | POA: Insufficient documentation

## 2014-02-16 DIAGNOSIS — R51 Headache: Secondary | ICD-10-CM | POA: Insufficient documentation

## 2014-02-16 MED ORDER — PRAVASTATIN SODIUM 20 MG PO TABS: 20.0000 mg | ORAL_TABLET | Freq: Every day | ORAL | Status: DC

## 2014-02-16 NOTE — Telephone Encounter (Signed)
Spoke to pt told him Dr. Raliegh Ip said okay to resume statin Therapy. Asked pt what he was on? Pt said he has not been on anything in a long time, they discussed Pravastatin. Told him okay will check with Dr. Raliegh Ip what dosage and I will send to pharmacy for him. Pt verbalized understanding.

## 2014-02-16 NOTE — Telephone Encounter (Signed)
Yes, OK to resume statin therapy

## 2014-02-16 NOTE — Telephone Encounter (Signed)
Please advise 

## 2014-02-16 NOTE — Telephone Encounter (Signed)
Pt was seen on 02-13-14. Pt would like to know if md is going to put him back on chole med. [facility] [address] Please call pt

## 2014-02-16 NOTE — Telephone Encounter (Signed)
Discussed with Dr. Raliegh Ip, he said Pravastatin 20 mg one tablet daily. Rx sent.

## 2014-02-16 NOTE — Progress Notes (Signed)
Carotid duplex performed 

## 2014-02-22 ENCOUNTER — Telehealth: Payer: Self-pay | Admitting: Internal Medicine

## 2014-02-22 NOTE — Telephone Encounter (Signed)
Pt would like carotid doppler results

## 2014-02-23 NOTE — Telephone Encounter (Signed)
Please call/notify patient that lab/test/procedure is normal 

## 2014-02-24 NOTE — Telephone Encounter (Signed)
Spoke to pt's wife Eric Stokes, told her Carotid Doppler was normal per Dr.K. Eric Stokes verbalized understanding and will let pt know.

## 2014-05-04 LAB — LIPID PANEL
Cholesterol: 178 mg/dL (ref 0–200)
HDL: 47 mg/dL (ref 35–70)
LDL Cholesterol: 123 mg/dL
Triglycerides: 170 mg/dL — AB (ref 40–160)

## 2014-05-04 LAB — BASIC METABOLIC PANEL
BUN: 22 mg/dL — AB (ref 4–21)
Creatinine: 0.9 mg/dL (ref 0.6–1.3)
Glucose: 128 mg/dL
Potassium: 4.2 mmol/L (ref 3.4–5.3)
Sodium: 140 mmol/L (ref 137–147)

## 2014-05-04 LAB — HEPATIC FUNCTION PANEL
Alkaline Phosphatase: 59 U/L (ref 25–125)
Bilirubin, Total: 0.6 mg/dL

## 2014-05-04 LAB — CBC AND DIFFERENTIAL
HCT: 42 % (ref 41–53)
Hemoglobin: 14.7 g/dL (ref 13.5–17.5)
Neutrophils Absolute: 4 /uL
Platelets: 190 10*3/uL (ref 150–399)

## 2014-08-18 ENCOUNTER — Telehealth: Payer: Self-pay | Admitting: Internal Medicine

## 2014-08-22 MED ORDER — PRAVASTATIN SODIUM 20 MG PO TABS: 20.0000 mg | ORAL_TABLET | Freq: Every day | ORAL | Status: DC

## 2014-08-22 NOTE — Telephone Encounter (Signed)
Rx resent.

## 2014-08-22 NOTE — Telephone Encounter (Signed)
Patient spoke to [facility] and was told they didn't receive the below re-fill sent.  Can you please re-send.

## 2014-08-22 NOTE — Addendum Note (Signed)
Addended by: Marian Sorrow on: 08/22/2014 10:28 AM   Modules accepted: Orders

## 2014-09-11 ENCOUNTER — Ambulatory Visit: Payer: Self-pay | Admitting: Internal Medicine

## 2014-09-20 ENCOUNTER — Encounter: Payer: Self-pay | Admitting: Internal Medicine

## 2014-09-20 ENCOUNTER — Ambulatory Visit (INDEPENDENT_AMBULATORY_CARE_PROVIDER_SITE_OTHER): Payer: Medicare Other | Admitting: Internal Medicine

## 2014-09-20 VITALS — BP 150/84 | HR 78 | Temp 98.0°F | Resp 20 | Ht 69.0 in | Wt 247.0 lb

## 2014-09-20 DIAGNOSIS — E785 Hyperlipidemia, unspecified: Secondary | ICD-10-CM | POA: Diagnosis not present

## 2014-09-20 DIAGNOSIS — I10 Essential (primary) hypertension: Secondary | ICD-10-CM

## 2014-09-20 DIAGNOSIS — Z86010 Personal history of colonic polyps: Secondary | ICD-10-CM

## 2014-09-20 MED ORDER — PRAVASTATIN SODIUM 20 MG PO TABS: 20.0000 mg | ORAL_TABLET | Freq: Every day | ORAL | Status: DC

## 2014-09-20 MED ORDER — LOSARTAN POTASSIUM 50 MG PO TABS: 25.0000 mg | ORAL_TABLET | Freq: Every day | ORAL | Status: DC

## 2014-09-20 NOTE — Progress Notes (Signed)
Pre visit review using our clinic review tool, if applicable. No additional management support is needed unless otherwise documented below in the visit note. 

## 2014-09-20 NOTE — Patient Instructions (Signed)
Limit your sodium (Salt) intake    It is important that you exercise regularly, at least 20 minutes 3 to 4 times per week.  If you develop chest pain or shortness of breath seek  medical attention.  You need to lose weight.  Consider a lower calorie diet and regular exercise.  Please check your blood pressure on a regular basis.  If it is consistently greater than 150/90, please make an office appointment.

## 2014-09-20 NOTE — Progress Notes (Signed)
Subjective:    Patient ID: Eric Stokes, male    DOB: 1945-11-02, 69 y.o.   MRN: [id number]  HPI  69 year old patient who is seen today for follow-up.  He has a history of hypertension.  He is followed at the [facility] and now is on losartan 25 mg daily.  He has dyslipidemia and remains on pravastatin. He is had a recent ETT and screening for abdominal aortic aneurysm.  He has had recent lab.  Doing quite well.  EKG has revealed benign PVCs  No past medical history on file.  History   Social History  . Marital Status: Married    Spouse Name: N/A    Number of Children: N/A  . Years of Education: N/A   Occupational History  . Not on file.   Social History Main Topics  . Smoking status: Former Smoker    Types: Cigarettes    Quit date: 08/18/1965  . Smokeless tobacco: Never Used  . Alcohol Use: 1.2 - 3.6 oz/week    2-6 Cans of beer per week  . Drug Use: No  . Sexual Activity: Not on file   Other Topics Concern  . Not on file   Social History Narrative    No past surgical history on file.  No family history on file.  Allergies  Allergen Reactions  . Lipitor [Atorvastatin] Other (See Comments)    Leg Cramps  . Lisinopril Cough    Current Outpatient Prescriptions on File Prior to Visit  Medication Sig Dispense Refill  . cetirizine (ZYRTEC) 10 MG tablet Take 10 mg by mouth daily as needed.    Marland Kitchen ibuprofen (ADVIL,MOTRIN) 200 MG tablet Take 400 mg by mouth at bedtime.    . pravastatin (PRAVACHOL) 20 MG tablet Take 1 tablet (20 mg total) by mouth daily. 90 tablet 1   No current facility-administered medications on file prior to visit.    BP 150/84 mmHg  Pulse 78  Temp(Src) 98 F (36.7 C) (Oral)  Resp 20  Ht 5\' 9"  (1.753 m)  Wt 247 lb (112.038 kg)  BMI 36.46 kg/m2  SpO2 96%     Review of Systems  Constitutional: Negative for fever, chills, appetite change and fatigue.  HENT: Negative for congestion, dental problem, ear pain, hearing loss, sore  throat, tinnitus, trouble swallowing and voice change.   Eyes: Negative for pain, discharge and visual disturbance.  Respiratory: Negative for cough, chest tightness, wheezing and stridor.   Cardiovascular: Negative for chest pain, palpitations and leg swelling.  Gastrointestinal: Negative for nausea, vomiting, abdominal pain, diarrhea, constipation, blood in stool and abdominal distention.  Genitourinary: Negative for urgency, hematuria, flank pain, discharge, difficulty urinating and genital sores.  Musculoskeletal: Negative for myalgias, back pain, joint swelling, arthralgias, gait problem and neck stiffness.  Skin: Negative for rash.  Neurological: Negative for dizziness, syncope, speech difficulty, weakness, numbness and headaches.  Hematological: Negative for adenopathy. Does not bruise/bleed easily.  Psychiatric/Behavioral: Negative for behavioral problems and dysphoric mood. The patient is not nervous/anxious.        Objective:   Physical Exam  Constitutional: He is oriented to person, place, and time. He appears well-developed.  HENT:  Head: Normocephalic.  Right Ear: External ear normal.  Left Ear: External ear normal.  Wears hearing aids  Eyes: Conjunctivae and EOM are normal.  Neck: Normal range of motion.  Cardiovascular: Normal rate and normal heart sounds.   Pulmonary/Chest: Breath sounds normal.  Abdominal: Bowel sounds are normal.  Musculoskeletal: Normal range  of motion. He exhibits no edema or tenderness.  Neurological: He is alert and oriented to person, place, and time.  Psychiatric: He has a normal mood and affect. His behavior is normal.          Assessment & Plan:   Hypertension, fair control Dyslipidemia BPH.  Scheduled to see urology next month History colonic polyps Exogenous obesity.  Weight loss encouraged  CPX 6 months

## 2014-11-06 DIAGNOSIS — N401 Enlarged prostate with lower urinary tract symptoms: Secondary | ICD-10-CM | POA: Diagnosis not present

## 2014-11-09 DIAGNOSIS — N401 Enlarged prostate with lower urinary tract symptoms: Secondary | ICD-10-CM | POA: Diagnosis not present

## 2014-11-09 DIAGNOSIS — R351 Nocturia: Secondary | ICD-10-CM | POA: Diagnosis not present

## 2014-11-09 DIAGNOSIS — N390 Urinary tract infection, site not specified: Secondary | ICD-10-CM | POA: Diagnosis not present

## 2015-02-05 DIAGNOSIS — D225 Melanocytic nevi of trunk: Secondary | ICD-10-CM | POA: Diagnosis not present

## 2015-02-05 DIAGNOSIS — L570 Actinic keratosis: Secondary | ICD-10-CM | POA: Diagnosis not present

## 2015-02-05 DIAGNOSIS — L820 Inflamed seborrheic keratosis: Secondary | ICD-10-CM | POA: Diagnosis not present

## 2015-02-05 DIAGNOSIS — X32XXXD Exposure to sunlight, subsequent encounter: Secondary | ICD-10-CM | POA: Diagnosis not present

## 2015-02-12 ENCOUNTER — Other Ambulatory Visit: Payer: Self-pay

## 2015-03-19 DIAGNOSIS — H1859 Other hereditary corneal dystrophies: Secondary | ICD-10-CM | POA: Diagnosis not present

## 2015-03-19 DIAGNOSIS — H25013 Cortical age-related cataract, bilateral: Secondary | ICD-10-CM | POA: Diagnosis not present

## 2015-03-19 DIAGNOSIS — H2513 Age-related nuclear cataract, bilateral: Secondary | ICD-10-CM | POA: Diagnosis not present

## 2015-03-19 DIAGNOSIS — H524 Presbyopia: Secondary | ICD-10-CM | POA: Diagnosis not present

## 2015-03-25 DIAGNOSIS — M7542 Impingement syndrome of left shoulder: Secondary | ICD-10-CM | POA: Diagnosis not present

## 2015-04-17 DIAGNOSIS — M7542 Impingement syndrome of left shoulder: Secondary | ICD-10-CM | POA: Diagnosis not present

## 2015-07-30 DIAGNOSIS — L570 Actinic keratosis: Secondary | ICD-10-CM | POA: Diagnosis not present

## 2015-07-30 DIAGNOSIS — D225 Melanocytic nevi of trunk: Secondary | ICD-10-CM | POA: Diagnosis not present

## 2015-07-30 DIAGNOSIS — X32XXXD Exposure to sunlight, subsequent encounter: Secondary | ICD-10-CM | POA: Diagnosis not present

## 2015-09-11 DIAGNOSIS — R194 Change in bowel habit: Secondary | ICD-10-CM | POA: Diagnosis not present

## 2015-09-11 DIAGNOSIS — Z1211 Encounter for screening for malignant neoplasm of colon: Secondary | ICD-10-CM | POA: Diagnosis not present

## 2015-09-11 DIAGNOSIS — K625 Hemorrhage of anus and rectum: Secondary | ICD-10-CM | POA: Diagnosis not present

## 2015-09-11 DIAGNOSIS — K645 Perianal venous thrombosis: Secondary | ICD-10-CM | POA: Diagnosis not present

## 2015-09-17 ENCOUNTER — Telehealth: Payer: Self-pay | Admitting: Internal Medicine

## 2015-09-17 ENCOUNTER — Other Ambulatory Visit: Payer: Self-pay | Admitting: Internal Medicine

## 2015-09-17 NOTE — Telephone Encounter (Signed)
Pt would like to have PSA added to his cpe labs in the morning.  Pt states having some issues. Is that ok?

## 2015-09-17 NOTE — Telephone Encounter (Signed)
PSA should already be included with his physical labs because he is over 50.

## 2015-09-18 ENCOUNTER — Other Ambulatory Visit (INDEPENDENT_AMBULATORY_CARE_PROVIDER_SITE_OTHER): Payer: Medicare Other

## 2015-09-18 DIAGNOSIS — Z0000 Encounter for general adult medical examination without abnormal findings: Secondary | ICD-10-CM

## 2015-09-18 LAB — BASIC METABOLIC PANEL
BUN: 22 mg/dL (ref 6–23)
CO2: 28 mEq/L (ref 19–32)
Calcium: 9.3 mg/dL (ref 8.4–10.5)
Chloride: 103 mEq/L (ref 96–112)
Creatinine, Ser: 0.92 mg/dL (ref 0.40–1.50)
GFR: 86.38 mL/min (ref >60.00)
Glucose, Bld: 107 mg/dL — ABNORMAL HIGH (ref 70–99)
Potassium: 4.6 mEq/L (ref 3.5–5.1)
Sodium: 139 mEq/L (ref 135–145)

## 2015-09-18 LAB — HEPATIC FUNCTION PANEL
ALT: 14 U/L (ref 0–53)
AST: 13 U/L (ref 0–37)
Albumin: 4.3 g/dL (ref 3.5–5.2)
Alkaline Phosphatase: 43 U/L (ref 39–117)
Bilirubin, Direct: 0.1 mg/dL (ref 0.0–0.3)
Total Bilirubin: 0.7 mg/dL (ref 0.2–1.2)
Total Protein: 7.1 g/dL (ref 6.0–8.3)

## 2015-09-18 LAB — POC URINALSYSI DIPSTICK (AUTOMATED)
Bilirubin, UA: NEGATIVE
Glucose, UA: NEGATIVE
Ketones, UA: NEGATIVE
Leukocytes, UA: NEGATIVE
Nitrite, UA: NEGATIVE
Protein, UA: NEGATIVE
Spec Grav, UA: 1.015
Urobilinogen, UA: 0.2
pH, UA: 6.5

## 2015-09-18 LAB — CBC WITH DIFFERENTIAL/PLATELET
Basophils Absolute: 0 10*3/uL (ref 0.0–0.1)
Basophils Relative: 0.4 % (ref 0.0–3.0)
Eosinophils Absolute: 0.1 10*3/uL (ref 0.0–0.7)
Eosinophils Relative: 2.2 % (ref 0.0–5.0)
HCT: 43.8 % (ref 39.0–52.0)
Hemoglobin: 14.6 g/dL (ref 13.0–17.0)
Lymphocytes Relative: 30.1 % (ref 12.0–46.0)
Lymphs Abs: 1.5 10*3/uL (ref 0.7–4.0)
MCHC: 33.2 g/dL (ref 30.0–36.0)
MCV: 87.9 fl (ref 78.0–100.0)
Monocytes Absolute: 0.4 10*3/uL (ref 0.1–1.0)
Monocytes Relative: 7.6 % (ref 3.0–12.0)
Neutro Abs: 3 10*3/uL (ref 1.4–7.7)
Neutrophils Relative %: 59.7 % (ref 43.0–77.0)
Platelets: 208 10*3/uL (ref 150.0–400.0)
RBC: 4.99 Mil/uL (ref 4.22–5.81)
RDW: 13.3 % (ref 11.5–15.5)
WBC: 5.1 10*3/uL (ref 4.0–10.5)

## 2015-09-18 LAB — LIPID PANEL
Cholesterol: 161 mg/dL (ref 0–200)
HDL: 43 mg/dL (ref >39.00)
LDL Cholesterol: 98 mg/dL (ref 0–99)
NonHDL: 117.87
Total CHOL/HDL Ratio: 4
Triglycerides: 97 mg/dL (ref 0.0–149.0)
VLDL: 19.4 mg/dL (ref 0.0–40.0)

## 2015-09-18 LAB — TSH: TSH: 1.79 u[IU]/mL (ref 0.35–4.50)

## 2015-09-18 LAB — PSA: PSA: 3 ng/mL (ref 0.10–4.00)

## 2015-09-25 ENCOUNTER — Ambulatory Visit (INDEPENDENT_AMBULATORY_CARE_PROVIDER_SITE_OTHER): Payer: Medicare Other | Admitting: Internal Medicine

## 2015-09-25 ENCOUNTER — Encounter: Payer: Self-pay | Admitting: Internal Medicine

## 2015-09-25 VITALS — BP 130/60 | HR 67 | Temp 98.1°F | Resp 20 | Ht 69.0 in | Wt 236.0 lb

## 2015-09-25 DIAGNOSIS — R972 Elevated prostate specific antigen [PSA]: Secondary | ICD-10-CM

## 2015-09-25 DIAGNOSIS — Z9989 Dependence on other enabling machines and devices: Secondary | ICD-10-CM

## 2015-09-25 DIAGNOSIS — G4733 Obstructive sleep apnea (adult) (pediatric): Secondary | ICD-10-CM | POA: Insufficient documentation

## 2015-09-25 DIAGNOSIS — Z86010 Personal history of colonic polyps: Secondary | ICD-10-CM

## 2015-09-25 DIAGNOSIS — E785 Hyperlipidemia, unspecified: Secondary | ICD-10-CM

## 2015-09-25 DIAGNOSIS — I10 Essential (primary) hypertension: Secondary | ICD-10-CM | POA: Diagnosis not present

## 2015-09-25 DIAGNOSIS — Z0000 Encounter for general adult medical examination without abnormal findings: Secondary | ICD-10-CM

## 2015-09-25 HISTORY — DX: Obstructive sleep apnea (adult) (pediatric): G47.33

## 2015-09-25 HISTORY — DX: Dependence on other enabling machines and devices: Z99.89

## 2015-09-25 NOTE — Patient Instructions (Signed)
Limit your sodium (Salt) intake    It is important that you exercise regularly, at least 20 minutes 3 to 4 times per week.  If you develop chest pain or shortness of breath seek  medical attention.  You need to lose weight.  Consider a lower calorie diet and regular exercise.  Schedule your colonoscopy to help detect colon cancer.  Return in one year for follow-up

## 2015-09-25 NOTE — Progress Notes (Signed)
Subjective:    Patient ID: Eric Stokes, male    DOB: [birth year], 70 y.o.   MRN: [id number]  HPI 70  year-old patient who is seen today for an annual exam.  He has enjoyed excellent health;  he has been followed the [place] [facility] and last year did have a ETT as well as screening for AAA. He has also been followed by urology due to  mild BPH and elevated PSA. This has normalized.  He has a history of exogenous obesity He also has a history of an abnormal EKG suspicious for a prior inferior MI. He has had a nuclear medicine stress test in the past revealed no evidence of coronary artery disease. He is now being treated for glaucoma and has done well. He is followed closely by urology His last colonoscopy was about 4  years ago by Dr. Damaris Schooner Readings from Last 3 Encounters:  09/25/15 236 lb (107.049 kg)  09/20/14 247 lb (112.038 kg)  02/13/14 242 lb (109.77 kg)      Social History   Social History  . Marital Status: Married    Spouse Name: N/A  . Number of Children: N/A  . Years of Education: N/A   Occupational History  . Not on file.   Social History Main Topics  . Smoking status: Former Smoker    Types: Cigarettes    Quit date: 08/18/1965  . Smokeless tobacco: Never Used  . Alcohol Use: 1.2 - 3.6 oz/week    2-6 Cans of beer per week  . Drug Use: No  . Sexual Activity: Not on file   Other Topics Concern  . Not on file   Social History Narrative    No past surgical history on file.  No family history on file.  Allergies  Allergen Reactions  . Lipitor [Atorvastatin] Other (See Comments)    Leg Cramps  . Lisinopril Cough    Current Outpatient Prescriptions on File Prior to Visit  Medication Sig Dispense Refill  . aspirin 81 MG EC tablet Take 81 mg by mouth daily.     . cetirizine (ZYRTEC) 10 MG tablet Take 10 mg by mouth daily as needed.    . Cholecalciferol (VITAMIN D3) 2000 UNITS TABS Take 2,000 Int'l Units by mouth daily.     Marland Kitchen ibuprofen  (ADVIL,MOTRIN) 200 MG tablet Take 400 mg by mouth at bedtime.    Marland Kitchen losartan (COZAAR) 50 MG tablet Take 0.5 tablets (25 mg total) by mouth daily. 90 tablet 3  . pravastatin (PRAVACHOL) 20 MG tablet TAKE ONE TABLET BY MOUTH ONE TIME DAILY 90 tablet 1   No current facility-administered medications on file prior to visit.    BP 130/60 mmHg  Pulse 67  Temp(Src) 98.1 F (36.7 C) (Oral)  Resp 20  Ht 5\' 9"  (1.753 m)  Wt 236 lb (107.049 kg)  BMI 34.84 kg/m2  SpO2 98%  Family history father died at age 51 mother died at 82 father died of complications of COPD. One sister is in health Social history married 2 children and 3 grandchildren no history tobacco use since age 36 retired  16. Risk factors, based on past  M,S,F history cardiovascular as factors include a history of mild hypertension and dyslipidemia.  History of negative ETT as well as prior nuclear stress test  2.  Physical activities:  Remains quite active with the aspects of daily living. He has been a member of the health club but no formal exercise regimen.  Quite active around the house with gardening and repair  3.  Depression/mood: No history depression or mood disorder  4.  Hearing: Now uses hearing aids  5.  ADL's: Independent in all aspects of daily living  6.  Fall risk: Low  7.  Home safety: No problems identified  8.  Height weight, and visual acuity; height and weight stable no change in visual acuity.  Has been told that he has some early cataracts  9.  Counseling: Modest weight loss and better eating habits encouraged. Low-salt diet recommended  10. Lab orders based on risk factors: Laboratory profile including lipid panel and PSA will be reviewed  11. Referral : Followup urology  12. Care plan: Followup colonoscopy in 2017  13. Cognitive assessment: Alert and oriented with normal affect. No cognitive dysfunction  14.  Preventive services will include annual clinical examinations with screening lab.  He is  on 5 year intervals for screening colonoscopy receives annual eye exam and urology visits  15.  Provider list includes primary care ophthalmology, urology and GI       Review of Systems  Constitutional: Negative for fever, chills, appetite change and fatigue.  HENT: Negative for congestion, dental problem, ear pain, hearing loss, sore throat, tinnitus, trouble swallowing and voice change.   Eyes: Negative for pain, discharge and visual disturbance.  Respiratory: Negative for cough, chest tightness, wheezing and stridor.   Cardiovascular: Negative for chest pain, palpitations and leg swelling.  Gastrointestinal: Negative for nausea, vomiting, abdominal pain, diarrhea, constipation, blood in stool and abdominal distention.  Genitourinary: Negative for urgency, hematuria, flank pain, discharge, difficulty urinating and genital sores.  Musculoskeletal: Negative for myalgias, back pain, joint swelling, arthralgias, gait problem and neck stiffness.  Skin: Negative for rash.  Neurological: Negative for dizziness, syncope, speech difficulty, weakness, numbness and headaches.  Hematological: Negative for adenopathy. Does not bruise/bleed easily.  Psychiatric/Behavioral: Negative for behavioral problems and dysphoric mood. The patient is not nervous/anxious.        Objective:   Physical Exam  Constitutional: He appears well-developed and well-nourished.  HENT:  Head: Normocephalic and atraumatic.  Right Ear: External ear normal.  Left Ear: External ear normal.  Nose: Nose normal.  Mouth/Throat: Oropharynx is clear and moist.  Eyes: Conjunctivae and EOM are normal. Pupils are equal, round, and reactive to light. No scleral icterus.  Neck: Normal range of motion. Neck supple. No JVD present. No thyromegaly present.  Cardiovascular: Normal heart sounds and intact distal pulses.  Exam reveals no gallop and no friction rub.   No murmur heard. Grade [id number] systolic murmur Occasional ectopics   Pulmonary/Chest: Effort normal and breath sounds normal. He exhibits no tenderness.  Abdominal: Soft. Bowel sounds are normal. He exhibits no distension and no mass. There is no tenderness.  Genitourinary: Penis normal.  Musculoskeletal: Normal range of motion. He exhibits no edema or tenderness.  Lymphadenopathy:    He has no cervical adenopathy.  Neurological: He is alert. He has normal reflexes. No cranial nerve deficit. Coordination normal.  Skin: Skin is warm and dry. No rash noted.  Psychiatric: He has a normal mood and affect. His behavior is normal.          Assessment & Plan:   Preventive health exam History of hypertension.  Controlled Blood pressure today 1:30 over 60 History of mild dyslipidemia. Better diet weight loss exercise all encouraged.  LDL cholesterol at goal History of colonic polyps.  Follow-up colonoscopy scheduled June 2017   Followup urology-scheduled March  2017 Recheck 1 year

## 2015-09-25 NOTE — Progress Notes (Signed)
Pre visit review using our clinic review tool, if applicable. No additional management support is needed unless otherwise documented below in the visit note. 

## 2015-10-18 ENCOUNTER — Other Ambulatory Visit: Payer: Self-pay | Admitting: Internal Medicine

## 2015-12-04 DIAGNOSIS — N401 Enlarged prostate with lower urinary tract symptoms: Secondary | ICD-10-CM | POA: Diagnosis not present

## 2015-12-04 DIAGNOSIS — Z0000 Encounter for general adult medical examination without abnormal findings: Secondary | ICD-10-CM | POA: Diagnosis not present

## 2016-01-28 DIAGNOSIS — X32XXXD Exposure to sunlight, subsequent encounter: Secondary | ICD-10-CM | POA: Diagnosis not present

## 2016-01-28 DIAGNOSIS — L570 Actinic keratosis: Secondary | ICD-10-CM | POA: Diagnosis not present

## 2016-01-28 DIAGNOSIS — Z1283 Encounter for screening for malignant neoplasm of skin: Secondary | ICD-10-CM | POA: Diagnosis not present

## 2016-02-11 ENCOUNTER — Other Ambulatory Visit: Payer: Medicare Other

## 2016-03-21 DIAGNOSIS — Z1211 Encounter for screening for malignant neoplasm of colon: Secondary | ICD-10-CM | POA: Diagnosis not present

## 2016-03-21 DIAGNOSIS — Z86010 Personal history of colonic polyps: Secondary | ICD-10-CM | POA: Diagnosis not present

## 2016-03-21 DIAGNOSIS — K625 Hemorrhage of anus and rectum: Secondary | ICD-10-CM | POA: Diagnosis not present

## 2016-03-21 DIAGNOSIS — R194 Change in bowel habit: Secondary | ICD-10-CM | POA: Diagnosis not present

## 2016-04-30 ENCOUNTER — Other Ambulatory Visit: Payer: Self-pay | Admitting: Internal Medicine

## 2016-05-20 DIAGNOSIS — H25013 Cortical age-related cataract, bilateral: Secondary | ICD-10-CM | POA: Diagnosis not present

## 2016-05-20 DIAGNOSIS — H2513 Age-related nuclear cataract, bilateral: Secondary | ICD-10-CM | POA: Diagnosis not present

## 2016-05-20 DIAGNOSIS — H524 Presbyopia: Secondary | ICD-10-CM | POA: Diagnosis not present

## 2016-05-29 ENCOUNTER — Other Ambulatory Visit: Payer: Self-pay | Admitting: Internal Medicine

## 2016-09-19 ENCOUNTER — Other Ambulatory Visit: Payer: Medicare Other

## 2016-09-26 ENCOUNTER — Encounter: Payer: Medicare Other | Admitting: Internal Medicine

## 2016-10-06 DIAGNOSIS — L821 Other seborrheic keratosis: Secondary | ICD-10-CM | POA: Diagnosis not present

## 2016-10-06 DIAGNOSIS — D225 Melanocytic nevi of trunk: Secondary | ICD-10-CM | POA: Diagnosis not present

## 2016-10-06 DIAGNOSIS — X32XXXD Exposure to sunlight, subsequent encounter: Secondary | ICD-10-CM | POA: Diagnosis not present

## 2016-10-06 DIAGNOSIS — D1801 Hemangioma of skin and subcutaneous tissue: Secondary | ICD-10-CM | POA: Diagnosis not present

## 2016-10-06 DIAGNOSIS — L570 Actinic keratosis: Secondary | ICD-10-CM | POA: Diagnosis not present

## 2016-10-21 ENCOUNTER — Other Ambulatory Visit: Payer: Self-pay | Admitting: Internal Medicine

## 2016-12-16 ENCOUNTER — Ambulatory Visit (INDEPENDENT_AMBULATORY_CARE_PROVIDER_SITE_OTHER): Payer: Medicare Other | Admitting: Internal Medicine

## 2016-12-16 ENCOUNTER — Encounter: Payer: Self-pay | Admitting: Internal Medicine

## 2016-12-16 VITALS — BP 154/82 | HR 64 | Temp 98.3°F | Ht 69.0 in | Wt 243.4 lb

## 2016-12-16 DIAGNOSIS — Z86010 Personal history of colonic polyps: Secondary | ICD-10-CM | POA: Diagnosis not present

## 2016-12-16 DIAGNOSIS — Z0000 Encounter for general adult medical examination without abnormal findings: Secondary | ICD-10-CM | POA: Diagnosis not present

## 2016-12-16 DIAGNOSIS — E669 Obesity, unspecified: Secondary | ICD-10-CM

## 2016-12-16 DIAGNOSIS — E785 Hyperlipidemia, unspecified: Secondary | ICD-10-CM

## 2016-12-16 DIAGNOSIS — R972 Elevated prostate specific antigen [PSA]: Secondary | ICD-10-CM | POA: Diagnosis not present

## 2016-12-16 DIAGNOSIS — I10 Essential (primary) hypertension: Secondary | ICD-10-CM | POA: Diagnosis not present

## 2016-12-16 DIAGNOSIS — IMO0001 Reserved for inherently not codable concepts without codable children: Secondary | ICD-10-CM

## 2016-12-16 LAB — LIPID PANEL
Cholesterol: 176 mg/dL (ref 0–200)
HDL: 45.8 mg/dL (ref >39.00)
LDL Cholesterol: 111 mg/dL — ABNORMAL HIGH (ref 0–99)
NonHDL: 129.97
Total CHOL/HDL Ratio: 4
Triglycerides: 94 mg/dL (ref 0.0–149.0)
VLDL: 18.8 mg/dL (ref 0.0–40.0)

## 2016-12-16 LAB — CBC WITH DIFFERENTIAL/PLATELET
Basophils Absolute: 0 10*3/uL (ref 0.0–0.1)
Basophils Relative: 0.7 % (ref 0.0–3.0)
Eosinophils Absolute: 0.1 10*3/uL (ref 0.0–0.7)
Eosinophils Relative: 2.5 % (ref 0.0–5.0)
HCT: 44.8 % (ref 39.0–52.0)
Hemoglobin: 15.1 g/dL (ref 13.0–17.0)
Lymphocytes Relative: 29.5 % (ref 12.0–46.0)
Lymphs Abs: 1.5 10*3/uL (ref 0.7–4.0)
MCHC: 33.7 g/dL (ref 30.0–36.0)
MCV: 87.9 fl (ref 78.0–100.0)
Monocytes Absolute: 0.4 10*3/uL (ref 0.1–1.0)
Monocytes Relative: 7.9 % (ref 3.0–12.0)
Neutro Abs: 3 10*3/uL (ref 1.4–7.7)
Neutrophils Relative %: 59.4 % (ref 43.0–77.0)
Platelets: 207 10*3/uL (ref 150.0–400.0)
RBC: 5.1 Mil/uL (ref 4.22–5.81)
RDW: 14.4 % (ref 11.5–15.5)
WBC: 5.1 10*3/uL (ref 4.0–10.5)

## 2016-12-16 LAB — COMPREHENSIVE METABOLIC PANEL
ALT: 19 U/L (ref 0–53)
AST: 17 U/L (ref 0–37)
Albumin: 4.4 g/dL (ref 3.5–5.2)
Alkaline Phosphatase: 48 U/L (ref 39–117)
BUN: 21 mg/dL (ref 6–23)
CO2: 29 mEq/L (ref 19–32)
Calcium: 9.8 mg/dL (ref 8.4–10.5)
Chloride: 104 mEq/L (ref 96–112)
Creatinine, Ser: 0.88 mg/dL (ref 0.40–1.50)
GFR: 90.6 mL/min (ref >60.00)
Glucose, Bld: 100 mg/dL — ABNORMAL HIGH (ref 70–99)
Potassium: 4.1 mEq/L (ref 3.5–5.1)
Sodium: 139 mEq/L (ref 135–145)
Total Bilirubin: 0.9 mg/dL (ref 0.2–1.2)
Total Protein: 7.3 g/dL (ref 6.0–8.3)

## 2016-12-16 LAB — TSH: TSH: 2.28 u[IU]/mL (ref 0.35–4.50)

## 2016-12-16 MED ORDER — AMLODIPINE BESYLATE 5 MG PO TABS: 5.0000 mg | ORAL_TABLET | Freq: Every day | ORAL | 3 refills | Status: DC

## 2016-12-16 NOTE — Progress Notes (Signed)
Pre visit review using our clinic review tool, if applicable. No additional management support is needed unless otherwise documented below in the visit note. 

## 2016-12-16 NOTE — Progress Notes (Signed)
Subjective:    Patient ID: Eric Stokes, male    DOB: 09-Mar-1946, 71 y.o.   MRN: [id number]  HPI 71 year old patient who is seen today for a preventive health examination and Medicare wellness visit Medical problems include a history of hypertension.  He has a history of allergic rhinitis and mild asthma. He has had a recent eye examination and is also followed by dermatology. He has a history colonic polyps and had follow-up colonoscopy in June 2017. He has a history of BPH and elevated PSA and is scheduled to see urology next month.  He has a history of OSA and remains on CPAP.  No past medical history on file.   Social History   Social History  . Marital status: Married    Spouse name: N/A  . Number of children: N/A  . Years of education: N/A   Occupational History  . Not on file.   Social History Main Topics  . Smoking status: Former Smoker    Types: Cigarettes    Quit date: 08/18/1965  . Smokeless tobacco: Never Used  . Alcohol use 1.2 - 3.6 oz/week    2 - 6 Cans of beer per week  . Drug use: No  . Sexual activity: Not on file   Other Topics Concern  . Not on file   Social History Narrative  . No narrative on file    No past surgical history on file.  No family history on file.  Allergies  Allergen Reactions  . Lipitor [Atorvastatin] Other (See Comments)    Leg Cramps  . Lisinopril Cough    Current Outpatient Prescriptions on File Prior to Visit  Medication Sig Dispense Refill  . albuterol (PROVENTIL HFA;VENTOLIN HFA) 108 (90 Base) MCG/ACT inhaler Inhale 2 puffs into the lungs every 6 (six) hours as needed for wheezing or shortness of breath.    Marland Kitchen amLODipine (NORVASC) 5 MG tablet TAKE 1/2 TABLET DAILY FOR BLOOD PRESSURE.  3  . aspirin 81 MG EC tablet Take 81 mg by mouth daily.     . cetirizine (ZYRTEC) 10 MG tablet Take 10 mg by mouth daily as needed.    . Cholecalciferol (VITAMIN D3) 2000 UNITS TABS Take 2,000 Int'l Units by mouth daily.     .  fluticasone (FLONASE) 50 MCG/ACT nasal spray USE TWO SPRAYS IN EACH NOSTRIL DAILY AS NEEDED. 16 g 5  . ibuprofen (ADVIL,MOTRIN) 200 MG tablet Take 400 mg by mouth at bedtime.    Marland Kitchen losartan (COZAAR) 50 MG tablet TAKE ONE-HALF TABLET BY MOUTH DAILY 90 tablet 0  . pravastatin (PRAVACHOL) 20 MG tablet TAKE ONE TABLET BY MOUTH ONE TIME DAILY 90 tablet 1   No current facility-administered medications on file prior to visit.     BP (!) 154/82 (BP Location: Left Arm, Patient Position: Sitting, Cuff Size: Normal)   Pulse 64   Temp 98.3 F (36.8 C) (Oral)   Ht 5\' 9"  (1.753 m)   Wt 243 lb 6.4 oz (110.4 kg)   SpO2 98%   BMI 35.94 kg/m   Medicare wellness visit  1. Risk factors, based on past  M,S,F history.  Cardiovascular risk factors include a history of hypertension  2.  Physical activities:fairly active.  Does walk 3 times per week, but limited due to knee pain  3.  Depression/mood:no history of major depression or mood disorder  4.  Hearing:no deficits  5.  ADL's:independent  6.  Fall risk:low  7.  Home safety:no problems identified  8.  Height weight, and visual acuity;height and weight stable no change in visual acuity does have cataracts.  Is followed by ophthalmology twice annually.  Does have some borderline elevated intraocular pressure  9.  Counseling:efforts at weight loss and heart healthy diet.  Encouraged  10. Lab orders based on risk factors:laboratory profile be reviewed  11. Referral :follow-up urology  12. Care plan:continue efforts at aggressive risk factor modification.  Patient did have negative ETT in December 2015  13. Cognitive assessment: alert in order with normal affect no cognitive dysfunction  14. Screening: Patient provided with a written and personalized 5-10 year screening schedule in the AVS.    15. Provider List Update: primary care dermatology GI ophthalmology and urology     Review of Systems  Constitutional: Negative for appetite  change, chills, fatigue and fever.  HENT: Negative for congestion, dental problem, ear pain, hearing loss, sore throat, tinnitus, trouble swallowing and voice change.   Eyes: Negative for pain, discharge and visual disturbance.  Respiratory: Negative for cough, chest tightness, wheezing and stridor.   Cardiovascular: Negative for chest pain, palpitations and leg swelling.  Gastrointestinal: Negative for abdominal distention, abdominal pain, blood in stool, constipation, diarrhea, nausea and vomiting.  Genitourinary: Positive for frequency. Negative for difficulty urinating, discharge, flank pain, genital sores, hematuria and urgency.  Musculoskeletal: Positive for arthralgias and gait problem. Negative for back pain, joint swelling, myalgias and neck stiffness.       Right knee pain  Skin: Negative for rash.  Neurological: Negative for dizziness, syncope, speech difficulty, weakness, numbness and headaches.  Hematological: Negative for adenopathy. Does not bruise/bleed easily.  Psychiatric/Behavioral: Negative for behavioral problems and dysphoric mood. The patient is not nervous/anxious.        Objective:   Physical Exam  Constitutional: He is oriented to person, place, and time. He appears well-developed.  Weight 243 Blood pressure 142/80  HENT:  Head: Normocephalic.  Right Ear: External ear normal.  Left Ear: External ear normal.  Pharyngeal crowding  Eyes: Conjunctivae and EOM are normal.  Neck: Normal range of motion.  Cardiovascular: Normal rate and normal heart sounds.   Slightly irregular  Pulmonary/Chest: Breath sounds normal.  Abdominal: Bowel sounds are normal.  Musculoskeletal: Normal range of motion. He exhibits no edema or tenderness.  Mild right effusion and warm to touch  Neurological: He is alert and oriented to person, place, and time.  Psychiatric: He has a normal mood and affect. His behavior is normal.          Assessment & Plan:   Preventive health  examination Subsequent Medicare wellness visit Essential hypertension, stable Obesity.  Weight loss encouraged BPH.  Follow-up urology History colonic polyps.  Continue five-year colonoscopies OSA.  Weight loss encouraged.  Continue CPAP History mild dyslipidemia.  We'll review a lipid profile Allergic rhinitis  Follow-up 6 months  Ledarrius Beauchaine Pilar Plate

## 2016-12-16 NOTE — Patient Instructions (Signed)

## 2016-12-29 ENCOUNTER — Encounter: Payer: Self-pay | Admitting: Family Medicine

## 2016-12-29 ENCOUNTER — Ambulatory Visit: Payer: Medicare Other | Admitting: Internal Medicine

## 2016-12-29 ENCOUNTER — Ambulatory Visit (INDEPENDENT_AMBULATORY_CARE_PROVIDER_SITE_OTHER): Payer: Medicare Other | Admitting: Family Medicine

## 2016-12-29 VITALS — BP 148/82 | HR 66 | Temp 97.8°F | Wt 244.2 lb

## 2016-12-29 DIAGNOSIS — I10 Essential (primary) hypertension: Secondary | ICD-10-CM | POA: Diagnosis not present

## 2016-12-29 DIAGNOSIS — E785 Hyperlipidemia, unspecified: Secondary | ICD-10-CM

## 2016-12-29 DIAGNOSIS — R001 Bradycardia, unspecified: Secondary | ICD-10-CM | POA: Diagnosis not present

## 2016-12-29 NOTE — Progress Notes (Signed)
Subjective:    Patient ID: Eric Stokes, male    DOB: 27-Mar-1946, 71 y.o.   MRN: [id number]  HPI  Eric Stokes is a 71 year old male who presents today for fatigue intermittently that has been present over the past 5 years but has become more noticeable over the past 2 days. He reports fatigue and lower heart rate and his daughter who is a NP stated that after visiting a grocery store he was noticeably more tired which is unusual for him. She reports a mild SOB that did not persist and is not present today.  History of stress test and US carotids 3 years ago that was unremarkable.    History of HTN with amlodipine 2.5 mg day and losartan taken daily. He monitors his BP sporadically with systolic averages of 979 and diastolic averages in the low 80s. He was advised to increase his amlodipine to 5 mg daily by his provider and he states that he has not started this at this time but plans to do so. He denies chest pain, palpitations, SOB, N/V, diaphoresis, jaw pain, arm pain, headaches, numbness, tingling, edema, or weakness. He is maintained on Pravastatin every other day without adverse effects. He provided a copy of his stress test that was completed 07/19/2014 which was unremarkable other than an occasional PVC pre-test but during test no significant rhythm disturbance.  Review of Systems  Constitutional: Positive for fatigue. Negative for chills and fever.  Respiratory: Negative for cough, shortness of breath and wheezing.   Cardiovascular: Negative for chest pain, palpitations and leg swelling.  Gastrointestinal: Negative for abdominal pain, diarrhea, nausea and vomiting.  Musculoskeletal: Negative for arthralgias and myalgias.  Skin: Negative for rash.  Neurological: Negative for dizziness, weakness, light-headedness and headaches.   No past medical history on file.   Social History   Social History  . Marital status: Married    Spouse name: N/A  . Number of children: N/A  .  Years of education: N/A   Occupational History  . Not on file.   Social History Main Topics  . Smoking status: Former Smoker    Types: Cigarettes    Quit date: 08/18/1965  . Smokeless tobacco: Never Used  . Alcohol use 1.2 - 3.6 oz/week    2 - 6 Cans of beer per week  . Drug use: No  . Sexual activity: Not on file   Other Topics Concern  . Not on file   Social History Narrative  . No narrative on file    No past surgical history on file.  No family history on file.  Allergies  Allergen Reactions  . Lipitor [Atorvastatin] Other (See Comments)    Leg Cramps  . Lisinopril Cough    Current Outpatient Prescriptions on File Prior to Visit  Medication Sig Dispense Refill  . albuterol (PROVENTIL HFA;VENTOLIN HFA) 108 (90 Base) MCG/ACT inhaler Inhale 2 puffs into the lungs every 6 (six) hours as needed for wheezing or shortness of breath.    Marland Kitchen amLODipine (NORVASC) 5 MG tablet Take 1 tablet (5 mg total) by mouth daily. 90 tablet 3  . aspirin 81 MG EC tablet Take 81 mg by mouth daily.     . cetirizine (ZYRTEC) 10 MG tablet Take 10 mg by mouth daily as needed.    . Cholecalciferol (VITAMIN D3) 2000 UNITS TABS Take 2,000 Int'l Units by mouth daily.     . fluticasone (FLONASE) 50 MCG/ACT nasal spray USE TWO SPRAYS IN EACH NOSTRIL  DAILY AS NEEDED. 16 g 5  . ibuprofen (ADVIL,MOTRIN) 200 MG tablet Take 400 mg by mouth at bedtime.    Marland Kitchen losartan (COZAAR) 50 MG tablet TAKE ONE-HALF TABLET BY MOUTH DAILY 90 tablet 0  . pravastatin (PRAVACHOL) 20 MG tablet TAKE ONE TABLET BY MOUTH ONE TIME DAILY 90 tablet 1   No current facility-administered medications on file prior to visit.     BP (!) 148/82 (BP Location: Left Arm, Patient Position: Sitting, Cuff Size: Normal)   Pulse 66   Temp 97.8 F (36.6 C) (Oral)   Wt 244 lb 3.2 oz (110.8 kg)   SpO2 98%   BMI 36.06 kg/m        Objective:   Physical Exam  Constitutional: He is oriented to person, place, and time. He appears well-developed  and well-nourished.  Eyes: Pupils are equal, round, and reactive to light. No scleral icterus.  Neck: Neck supple.  Cardiovascular: Normal rate, regular rhythm and intact distal pulses.   Pulmonary/Chest: Effort normal and breath sounds normal. He has no wheezes. He has no rales.  Abdominal: Soft. Bowel sounds are normal. There is no tenderness.  Musculoskeletal: He exhibits no edema.  Lymphadenopathy:    He has no cervical adenopathy.  Neurological: He is alert and oriented to person, place, and time.  Skin: Skin is warm and dry. No rash noted.  Psychiatric: He has a normal mood and affect. His behavior is normal. Judgment and thought content normal.       Assessment & Plan:  1. Bradycardia Exam is reassuring today. He is not taking a medication that may be contributing to symptoms. As patient is reporting fatigue and he and daughter are concerned regarding recent episode of bradycardia; further evaluation with cardiology to determine cause will be initiated. Referral placed and advised seeking immediate medical attention if he develops chest pain, palpitations, SOB, N/V, jaw pain, or arm pain. Daughter will monitor BP and verbalized understanding of plan. EKG Interpretation Date/Time: 12/29/16   Time:  11/24 Ventricular Rate: 54 PR Interval: 196 msec QRS Duration: 104 msec QT Interval: 436 msec Text Interpretation: Sinus Bradycardia  - EKG 12-Lead - Ambulatory referral to Cardiology  2. Essential hypertension, benign Advised regimen that was prescribed by provider. Amlodipine 5 mg and losartan 50 mg daily. We reviewed the importance of monitoring BP, documenting readings, and follow up with provider as recommended. Advised if BP is greater than 150/90, make an appointment for further evaluation and treatment.  3. Dyslipidemia Continue pravastatin as prescribed by PCP.  Delano Metz, FNP-C

## 2016-12-29 NOTE — Patient Instructions (Signed)
You will be contact about your referral.  Please let us know if you have not heard back within 1 week about your referral.   Please seek medical attention if you develop any symptoms of chest pain, palpitations, shortness or breath, nausea, vomiting, or increasing fatigue.   Bradycardia, Adult Bradycardia is a slower-than-normal heartbeat. A normal resting heart rate for an adult ranges from 60 to 100 beats per minute. With bradycardia, the resting heart rate is less than 60 beats per minute. Bradycardia can prevent enough oxygen from reaching certain areas of your body when you are active. It can be serious if it keeps enough oxygen from reaching your brain and other parts of your body. Bradycardia is not a problem for everyone. For some healthy adults, a slow resting heart rate is normal. What are the causes? This condition may be caused by:  A problem with the heart, including:  A problem with the heart's electrical system, such as a heart block.  A problem with the heart's natural pacemaker (sinus node).  Heart disease.  A heart attack.  Heart damage.  A heart infection.  A heart condition that is present at birth (congenital heart defect).  Certain medicines that treat heart conditions.  Certain conditions, such as hypothyroidism and obstructive sleep apnea.  Problems with the balance of chemicals and other substances, like potassium, in the blood. What increases the risk? This condition is more likely to develop in adults who:  Are age 69 or older.  Have high blood pressure (hypertension), high cholesterol (hyperlipidemia), or diabetes.  Drink heavily, use tobacco or nicotine products, or use drugs.  Are stressed. What are the signs or symptoms? Symptoms of this condition include:  Light-headedness.  Feeling faint or fainting.  Fatigue and weakness.  Shortness of breath.  Chest pain (angina).  Drowsiness.  Confusion.  Dizziness. How is this  diagnosed? This condition may be diagnosed based on:  Your symptoms.  Your medical history.  A physical exam. During the exam, your health care provider will listen to your heartbeat and check your pulse. To confirm the diagnosis, your health care provider may order tests, such as:  Blood tests.  An electrocardiogram (ECG). This test records the heart's electrical activity. The test can show how fast your heart is beating and whether the heartbeat is steady.  A test in which you wear a portable device (event recorder or Holter monitor) to record your heart's electrical activity while you go about your day.  Anexercise test. How is this treated? Treatment for this condition depends on the cause of the condition and how severe your symptoms are. Treatment may involve:  Treatment of the underlying condition.  Changing your medicines or how much medicine you take.  Having a small, battery-operated device called a pacemaker implanted under the skin. When bradycardia occurs, this device can be used to increase your heart rate and help your heart to beat in a regular rhythm. Follow these instructions at home: Lifestyle    Manage any health conditions that contribute to bradycardia as told by your health care provider.  Follow a heart-healthy diet. A nutrition specialist (dietitian) can help to educate you about healthy food options and changes.  Follow an exercise program that is approved by your health care provider.  Maintain a healthy weight.  Try to reduce or manage your stress, such as with yoga or meditation. If you need help reducing stress, ask your health care provider.  Do not use use any products that  contain nicotine or tobacco, such as cigarettes and e-cigarettes. If you need help quitting, ask your health care provider.  Do not use illegal drugs.  Limit alcohol intake to no more than 1 drink per day for nonpregnant women and 2 drinks per day for men. One drink  equals 12 oz of beer, 5 oz of wine, or 1 oz of hard liquor. General instructions   Take over-the-counter and prescription medicines only as told by your health care provider.  Keep all follow-up visits as directed by your health care provider. This is important. How is this prevented? In some cases, bradycardia may be prevented by:  Treating underlying medical problems.  Stopping behaviors or medicines that can trigger the condition. Contact a health care provider if:  You feel light-headed or dizzy.  You almost faint.  You feel weak or are easily fatigued during physical activity.  You experience confusion or have memory problems. Get help right away if:  You faint.  You have an irregular heartbeat (palpitations).  You have chest pain.  You have trouble breathing. This information is not intended to replace advice given to you by your health care provider. Make sure you discuss any questions you have with your health care provider. Document Released: 04/26/2002 Document Revised: 04/01/2016 Document Reviewed: 01/24/2016 Elsevier Interactive Patient Education  2017 [employer].

## 2017-01-23 ENCOUNTER — Ambulatory Visit: Payer: Medicare Other | Admitting: Cardiology

## 2017-01-23 ENCOUNTER — Encounter: Payer: Self-pay | Admitting: Cardiology

## 2017-01-23 ENCOUNTER — Ambulatory Visit (INDEPENDENT_AMBULATORY_CARE_PROVIDER_SITE_OTHER): Payer: Medicare Other | Admitting: Cardiology

## 2017-01-23 VITALS — BP 158/84 | HR 62 | Ht 68.5 in | Wt 247.4 lb

## 2017-01-23 DIAGNOSIS — R5383 Other fatigue: Secondary | ICD-10-CM | POA: Diagnosis not present

## 2017-01-23 DIAGNOSIS — R001 Bradycardia, unspecified: Secondary | ICD-10-CM | POA: Diagnosis not present

## 2017-01-23 DIAGNOSIS — I493 Ventricular premature depolarization: Secondary | ICD-10-CM | POA: Diagnosis not present

## 2017-01-23 NOTE — Progress Notes (Signed)
01/23/2017 Eric Stokes   1945-12-10  [id number]  Primary Physician Marletta Lor, MD Primary Cardiologist: New (Dr. Burt Knack, [place])   Reason for Visit/CC: New Patient Evaluation for Bradycardia  Referring Provider: Delano Metz, FNP  HPI:  Eric Stokes is a 71 y.o. male who is being seen today, as a new patient, for the evaluation of bradycardia at the request of Delano Metz, [place].   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 His PMH is notable for treated HTN. He is on amlodipien and losartan. He is not on a BB. He has no documented h/o CAD. He had an ETT at the [place] in [place] in 2015 that was negative for ischemia. He has HLD. Lipid panel 12/2015 showed elevated LDL at 111 mg/dL. He is on Pravastatin 20 mg. 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    He was recently seen by his PCP for evaluation given recent fatigue and slower HRs. It is not uncommon for his HR to drop in the 50s, however he is not on any rate slowing medications. He denies any recent exertional CP but sometimes gets "winded" while doing yard work. He notes occasional dizziness but nothing severe. No syncope/ near syncope. Recent EKG at PCP office showed sinus bradycardia with HR of 54 bpm. Laboratory work was normal. TSH 12/2016 was WNL. K also normal at 4.1. CBC negative for anemia.   Vital signs today reveal a BP of 158/84. Pulse rate is 62. EKG shows sinus bradycardia, 56 bpm with PVCs.      Current Meds  Medication Sig  . albuterol (PROVENTIL HFA;VENTOLIN HFA) 108 (90 Base) MCG/ACT inhaler Inhale 2 puffs into the lungs every 6 (six) hours as needed for wheezing or shortness of breath.  Marland Kitchen amLODipine (NORVASC) 5 MG tablet Take 1 tablet (5 mg total) by mouth daily.  Marland Kitchen aspirin 81 MG EC tablet Take 81 mg by mouth daily.   . cetirizine (ZYRTEC) 10 MG tablet Take 10 mg by mouth daily as needed.  . Cholecalciferol (VITAMIN D3) 2000 UNITS TABS Take 2,000 Int'l Units by mouth daily.    . fluticasone (FLONASE) 50 MCG/ACT nasal spray USE TWO SPRAYS IN EACH NOSTRIL DAILY AS NEEDED.  Marland Kitchen ibuprofen (ADVIL,MOTRIN) 200 MG tablet Take 400 mg by mouth at bedtime.  Marland Kitchen losartan (COZAAR) 50 MG tablet TAKE ONE-HALF TABLET BY MOUTH DAILY  . montelukast (SINGULAIR) 10 MG tablet Take 10 mg by mouth daily.  . pravastatin (PRAVACHOL) 20 MG tablet TAKE ONE TABLET BY MOUTH ONE TIME DAILY   Allergies  Allergen Reactions  . Lipitor [Atorvastatin] Other (See Comments)    Leg Cramps  . Lisinopril Cough   Past Medical History:  Diagnosis Date  . BPH (benign prostatic hyperplasia) 09/03/2012  . Dyslipidemia 12/21/2008   Qualifier: Diagnosis of  By: Percival Spanish, MD, Farrel Gordon    . ELECTROCARDIOGRAM, ABNORMAL 12/21/2008   Qualifier: Diagnosis of  By: Percival Spanish, MD, Farrel Gordon    . Elevated PSA 09/03/2012  . Essential hypertension, benign 12/21/2008   Qualifier: Diagnosis of  By: Percival Spanish, MD, Farrel Gordon    . Hx of colonic polyps 09/03/2012   Last colonoscopy March 2012 by Dr. Collene Mares   . Obesity, unspecified 12/21/2008   Qualifier: Diagnosis of  By: Percival Spanish, MD, Farrel Gordon    . OSA on CPAP 09/25/2015   Diagnosed at the [facility] 2016    No family history on file. No past surgical  history on file. Social History   Social History  . Marital status: Married    Spouse name: N/A  . Number of children: N/A  . Years of education: N/A   Occupational History  . Not on file.   Social History Main Topics  . Smoking status: Former Smoker    Types: Cigarettes    Quit date: 08/18/1965  . Smokeless tobacco: Never Used  . Alcohol use 1.2 - 3.6 oz/week    2 - 6 Cans of beer per week  . Drug use: No  . Sexual activity: Not on file   Other Topics Concern  . Not on file   Social History Narrative  . No narrative on file     Review of Systems: General: negative for chills, fever, night sweats or weight changes.  Cardiovascular: negative for chest pain, dyspnea on exertion, edema, orthopnea,  palpitations, paroxysmal nocturnal dyspnea or shortness of breath Dermatological: negative for rash Respiratory: negative for cough or wheezing Urologic: negative for hematuria Abdominal: negative for nausea, vomiting, diarrhea, bright red blood per rectum, melena, or hematemesis Neurologic: negative for visual changes, syncope, or dizziness All other systems reviewed and are otherwise negative except as noted above.   Physical Exam:  Blood pressure (!) 158/84, pulse 62, height 5' 8.5" (1.74 m), weight 247 lb 6.4 oz (112.2 kg).  General appearance: alert, cooperative, no distress and moderately obese Neck: no carotid bruit and no JVD Lungs: clear to auscultation bilaterally Heart: bradycardia with PVCs no murmurs Extremities: extremities normal, atraumatic, no cyanosis or edema Pulses: 2+ and symmetric Skin: Skin color, texture, turgor normal. No rashes or lesions Neurologic: Grossly normal  EKG sinus bradycardia 56 bpm with PVCs -- personally reviewed   ASSESSMENT AND PLAN:   1. Bradycardia: mild. HR in the 50s. EKG w/o ST abnormalities but he has PVCs. He is not on any rate control agents. Recent TSH, CMP and CBC all unremarkable. No chest pain. No syncope. He reports full nightly compliance with his CPAP. I've discuss case with Dr. Burt Knack, DOD. We will plan for a 24 hr heart monitor to further assess his HR to r/o significant bradycardia and to assess PVC burden. We will also order an ETT to ensure that his HR responds appropriately with exercise.   2. Fatigue: recent TSH was WNL. CBC negative for anemia. He is fully compliant with CPAP for OSA. He has mild bradycardia and PVCs and we  will conduct w/u as outlined above. If negative, I have advised that he f/u with his PCP to check Vitamin D and Testosterone.   Follow-Up with in me in 1-2 weeks after w/u.   Tatiyanna Lashley Ladoris Gene, MHS [facility] 01/23/2017 11:14 AM

## 2017-01-23 NOTE — Patient Instructions (Addendum)
Medication Instructions:  Your physician recommends that you continue on your current medications as directed. Please refer to the Current Medication list given to you today.  Labwork: NONE  Testing/Procedures: Your physician has recommended that you wear a holter monitor. Holter monitors are medical devices that record the heart's electrical activity. Doctors most often use these monitors to diagnose arrhythmias. Arrhythmias are problems with the speed or rhythm of the heartbeat. The monitor is a small, portable device. You can wear one while you do your normal daily activities. This is usually used to diagnose what is causing palpitations/syncope (passing out).  Your physician has requested that you have an exercise tolerance test. For further information please visit [email or web address]. Please also follow instruction sheet, as given.  Follow-Up: Your physician wants you to follow-up in: 2 weeks with Mare Loan PA after test are complete.    If you need a refill on your cardiac medications before your next appointment, please call your pharmacy.

## 2017-01-29 ENCOUNTER — Ambulatory Visit (INDEPENDENT_AMBULATORY_CARE_PROVIDER_SITE_OTHER): Payer: Medicare Other

## 2017-01-29 DIAGNOSIS — R5383 Other fatigue: Secondary | ICD-10-CM

## 2017-01-29 DIAGNOSIS — R001 Bradycardia, unspecified: Secondary | ICD-10-CM

## 2017-01-29 DIAGNOSIS — I493 Ventricular premature depolarization: Secondary | ICD-10-CM | POA: Diagnosis not present

## 2017-01-29 LAB — EXERCISE TOLERANCE TEST
Estimated workload: 7.8 METS
Exercise duration (min): 6 min
Exercise duration (sec): 32 s
MPHR: 149 {beats}/min
Peak HR: 129 {beats}/min
Percent HR: 86 %
Percent of predicted max HR: 86 %
RPE: 17
Rest HR: 66 {beats}/min
Stage 1 DBP: 79 mmHg
Stage 1 Grade: 0 %
Stage 1 HR: 75 {beats}/min
Stage 1 SBP: 149 mmHg
Stage 1 Speed: 0 mph
Stage 2 Grade: 0 %
Stage 2 HR: 75 {beats}/min
Stage 2 Speed: 1 mph
Stage 3 Grade: 0.1 %
Stage 3 HR: 75 {beats}/min
Stage 3 Speed: 1 mph
Stage 4 DBP: 64 mmHg
Stage 4 Grade: 10 %
Stage 4 HR: 99 {beats}/min
Stage 4 SBP: 167 mmHg
Stage 4 Speed: 1.7 mph
Stage 5 DBP: 60 mmHg
Stage 5 Grade: 12 %
Stage 5 HR: 122 {beats}/min
Stage 5 SBP: 203 mmHg
Stage 5 Speed: 2.5 mph
Stage 6 Grade: 14 %
Stage 6 HR: 129 {beats}/min
Stage 6 Speed: 3.4 mph
Stage 7 DBP: 55 mmHg
Stage 7 Grade: 0 %
Stage 7 HR: 113 {beats}/min
Stage 7 SBP: 141 mmHg
Stage 7 Speed: 0 mph
Stage 8 DBP: 72 mmHg
Stage 8 Grade: 0 %
Stage 8 HR: 76 {beats}/min
Stage 8 SBP: 134 mmHg
Stage 8 Speed: 0 mph

## 2017-01-30 ENCOUNTER — Telehealth: Payer: Self-pay | Admitting: Cardiology

## 2017-01-30 NOTE — Telephone Encounter (Signed)
Returned call to wife (ok per DPR)-aware of GXT results.    Verbalized understanding.

## 2017-01-30 NOTE — Telephone Encounter (Signed)
New message ° ° ° ° °Returning a call to the nurse °

## 2017-02-04 ENCOUNTER — Ambulatory Visit (INDEPENDENT_AMBULATORY_CARE_PROVIDER_SITE_OTHER): Payer: Medicare Other | Admitting: Cardiology

## 2017-02-04 ENCOUNTER — Encounter: Payer: Self-pay | Admitting: Cardiology

## 2017-02-04 VITALS — BP 132/72 | HR 59 | Ht 68.0 in | Wt 250.0 lb

## 2017-02-04 DIAGNOSIS — R001 Bradycardia, unspecified: Secondary | ICD-10-CM

## 2017-02-04 NOTE — Patient Instructions (Signed)
Medication Instructions:  None  Labwork: None  Testing/Procedures: None  Follow-Up: Follow up will be based on the outcome of your monitor results.  For now we will say follow up as needed.   Any Other Special Instructions Will Be Listed Below (If Applicable).     If you need a refill on your cardiac medications before your next appointment, please call your pharmacy.

## 2017-02-04 NOTE — Progress Notes (Signed)
02/04/2017 Eric Stokes   Mar 21, 1946  [id number]  Primary Physician Marletta Lor, MD Primary Cardiologist: Dr. Burt Knack, [place]   Reason for Visit/CC: F/u for Fatigue and Bradycardia   HPI:  Eric Stokes is a 71 y.o. male who presents back to clinic today for f/u for fatigue and mild bradycardia. He was initially referred by his PCP.   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 His PMH is notable for treated HTN. He is on amlodipien and losartan. He is not on a BB. He has no documented h/o CAD. He had an ETT at the [place] in [place] in 2015 that was negative for ischemia. He has HLD. Lipid panel 12/2015 showed elevated LDL at 111 mg/dL. He is on Pravastatin 20 mg. 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    He was recently seen by his PCP for evaluation given recent fatigue and slower HRs. It is not uncommon for his HR to drop in the 50s, however he is not on any rate slowing medications. He denies any recent exertional CP but sometimes gets "winded" while doing yard work. He notes occasional dizziness but nothing severe. No syncope/ near syncope. Recent EKG at PCP office showed sinus bradycardia with HR of 54 bpm. Laboratory work was normal. TSH 12/2016 was WNL. K also normal at 4.1. CBC negative for anemia.   I evaluated him on 01/23/17, along with Dr. Burt Knack, who was DOD. At that visit, BP was 158/84. Pulse rate  62. EKG showed mild sinus bradycardia, 56 bpm with PVCs. Dr. Burt Knack recommended an ETT to assess HR response to exercise as well as a 24 hr holter monitor to assess the degree of his bradycardia.   Unfortunately, his monitor results are still in process. His ETT 01/29/17 was negative for ischemia. He had an appropriate HR response to exercise. His resting HR was 66 bpm. His Max HR was 129 bpm. His resting HR today is 59 bpm.    Current Meds  Medication Sig  . albuterol (PROVENTIL HFA;VENTOLIN HFA) 108 (90 Base) MCG/ACT inhaler Inhale 2 puffs into the lungs  every 6 (six) hours as needed for wheezing or shortness of breath.  Marland Kitchen amLODipine (NORVASC) 5 MG tablet Take 1 tablet (5 mg total) by mouth daily.  Marland Kitchen aspirin 81 MG EC tablet Take 81 mg by mouth daily.   . cetirizine (ZYRTEC) 10 MG tablet Take 10 mg by mouth daily.   . Cholecalciferol (VITAMIN D3) 2000 UNITS TABS Take 2,000 Int'l Units by mouth daily.   . fluticasone (FLONASE) 50 MCG/ACT nasal spray USE TWO SPRAYS IN EACH NOSTRIL DAILY AS NEEDED.  Marland Kitchen ibuprofen (ADVIL,MOTRIN) 200 MG tablet Take 400 mg by mouth at bedtime.  Marland Kitchen losartan (COZAAR) 50 MG tablet TAKE ONE-HALF TABLET BY MOUTH DAILY  . montelukast (SINGULAIR) 10 MG tablet Take 10 mg by mouth daily.  . pravastatin (PRAVACHOL) 20 MG tablet TAKE ONE TABLET BY MOUTH ONE TIME DAILY (Patient taking differently: TAKE ONE TABLET BY MOUTH EVERY OTHER DAY)   Allergies  Allergen Reactions  . Lipitor [Atorvastatin] Other (See Comments)    Leg Cramps  . Lisinopril Cough   Past Medical History:  Diagnosis Date  . BPH (benign prostatic hyperplasia) 09/03/2012  . Dyslipidemia 12/21/2008   Qualifier: Diagnosis of  By: Percival Spanish, MD, Farrel Gordon    . ELECTROCARDIOGRAM, ABNORMAL 12/21/2008   Qualifier: Diagnosis of  By: Percival Spanish, MD, Farrel Gordon    . Elevated  PSA 09/03/2012  . Essential hypertension, benign 12/21/2008   Qualifier: Diagnosis of  By: Percival Spanish, MD, Farrel Gordon    . Hx of colonic polyps 09/03/2012   Last colonoscopy March 2012 by Dr. Collene Mares   . Obesity, unspecified 12/21/2008   Qualifier: Diagnosis of  By: Percival Spanish, MD, Farrel Gordon    . OSA on CPAP 09/25/2015   Diagnosed at the [facility] 2016    No family history on file. No past surgical history on file. Social History   Social History  . Marital status: Married    Spouse name: N/A  . Number of children: N/A  . Years of education: N/A   Occupational History  . Not on file.   Social History Main Topics  . Smoking status: Former Smoker    Types: Cigarettes    Quit date:  08/18/1965  . Smokeless tobacco: Never Used  . Alcohol use 1.2 - 3.6 oz/week    2 - 6 Cans of beer per week  . Drug use: No  . Sexual activity: Not on file   Other Topics Concern  . Not on file   Social History Narrative  . No narrative on file     Review of Systems: General: negative for chills, fever, night sweats or weight changes.  Cardiovascular: negative for chest pain, dyspnea on exertion, edema, orthopnea, palpitations, paroxysmal nocturnal dyspnea or shortness of breath Dermatological: negative for rash Respiratory: negative for cough or wheezing Urologic: negative for hematuria Abdominal: negative for nausea, vomiting, diarrhea, bright red blood per rectum, melena, or hematemesis Neurologic: negative for visual changes, syncope, or dizziness All other systems reviewed and are otherwise negative except as noted above.   Physical Exam:  Blood pressure 132/72, pulse (!) 59, height 5\' 8"  (1.727 m), weight 250 lb (113.4 kg).  General appearance: alert, cooperative and no distress Neck: no carotid bruit and no JVD Lungs: clear to auscultation bilaterally Heart: regular rate and rhythm, S1, S2 normal, no murmur, click, rub or gallop Extremities: extremities normal, atraumatic, no cyanosis or edema Pulses: 2+ and symmetric Skin: Skin color, texture, turgor normal. No rashes or lesions Neurologic: Grossly normal  EKG not performed  -- personally reviewed   ASSESSMENT AND PLAN:    1. Bradycardia: mild. HR currently in the upper 50s. EKG during initial new patient appt showed NSR with PVC but no ischemia. He is not on any rate control agents. Recent TSH, CMP and CBC all unremarkable. No chest pain. No syncope. He reports full nightly compliance with his CPAP. Recent ETT showed normal HR response with exercise. His baseline HR was 66 and he was able to get HR up to 129 bpm. There were no ischemic EKG abnormalties noted.  We are still awaiting results of 24 hr holter monitor, to  assess degree of bradcyardia and to r/o significant pauses. Results are in process, awaiting MD interpretation.   2. Fatigue:  recent TSH was WNL. CBC negative for anemia. He is fully compliant with CPAP for OSA. He has mild bradycardia and PVCs. Awaiting monitor results. If unremarkable,  I have advised that he f/u with his PCP to check Vitamin D and Testosterone. We will notify patient of results once returned and will given further recommendations.    Follow-Up: if unremarkable monitor results, then no further cardiac w/u will be conducted and he may f/u as needed.   Modine Oppenheimer Ladoris Gene, MHS [facility] 02/04/2017 10:10 AM

## 2017-02-20 ENCOUNTER — Telehealth: Payer: Self-pay | Admitting: Internal Medicine

## 2017-02-20 NOTE — Telephone Encounter (Signed)
Pt needs new rx singulair 10 mg #90 W/[facility] [address] Pt is having trouble getting med from [place]

## 2017-02-23 MED ORDER — MONTELUKAST SODIUM 10 MG PO TABS: 10.0000 mg | ORAL_TABLET | Freq: Every day | ORAL | 3 refills | Status: DC

## 2017-02-23 NOTE — Telephone Encounter (Signed)
Medication was refilled.

## 2017-03-06 DIAGNOSIS — R972 Elevated prostate specific antigen [PSA]: Secondary | ICD-10-CM | POA: Diagnosis not present

## 2017-03-06 LAB — PSA: PSA: 2.66

## 2017-03-13 DIAGNOSIS — R350 Frequency of micturition: Secondary | ICD-10-CM | POA: Diagnosis not present

## 2017-03-13 DIAGNOSIS — R972 Elevated prostate specific antigen [PSA]: Secondary | ICD-10-CM | POA: Diagnosis not present

## 2017-03-13 DIAGNOSIS — N401 Enlarged prostate with lower urinary tract symptoms: Secondary | ICD-10-CM | POA: Diagnosis not present

## 2017-04-29 ENCOUNTER — Other Ambulatory Visit: Payer: Self-pay | Admitting: Internal Medicine

## 2017-05-04 DIAGNOSIS — L570 Actinic keratosis: Secondary | ICD-10-CM | POA: Diagnosis not present

## 2017-05-04 DIAGNOSIS — X32XXXD Exposure to sunlight, subsequent encounter: Secondary | ICD-10-CM | POA: Diagnosis not present

## 2017-05-04 DIAGNOSIS — D225 Melanocytic nevi of trunk: Secondary | ICD-10-CM | POA: Diagnosis not present

## 2017-05-04 DIAGNOSIS — L820 Inflamed seborrheic keratosis: Secondary | ICD-10-CM | POA: Diagnosis not present

## 2017-05-07 ENCOUNTER — Encounter: Payer: Self-pay | Admitting: Internal Medicine

## 2017-06-01 DIAGNOSIS — C44629 Squamous cell carcinoma of skin of left upper limb, including shoulder: Secondary | ICD-10-CM | POA: Diagnosis not present

## 2017-07-22 ENCOUNTER — Other Ambulatory Visit: Payer: Self-pay | Admitting: Internal Medicine

## 2017-07-27 ENCOUNTER — Other Ambulatory Visit: Payer: Self-pay | Admitting: Internal Medicine

## 2017-08-28 ENCOUNTER — Other Ambulatory Visit: Payer: Self-pay | Admitting: Internal Medicine

## 2017-08-28 MED ORDER — FLUTICASONE PROPIONATE 50 MCG/ACT NA SUSP: NASAL | 5 refills | Status: DC

## 2017-10-19 LAB — BASIC METABOLIC PANEL
Creatinine: 1.1 (ref 0.6–1.3)
Glucose: 110
Potassium: 4.3 (ref 3.4–5.3)
Sodium: 138 (ref 137–147)

## 2017-10-19 LAB — LIPID PANEL
Cholesterol: 178 (ref 0–200)
HDL: 49 (ref 35–70)
LDL Cholesterol: 109
Triglycerides: 101 (ref 40–160)

## 2017-10-19 LAB — HEPATIC FUNCTION PANEL
ALT: 26 (ref 10–40)
AST: 21 (ref 14–40)
Bilirubin, Direct: 0.1 (ref 0.01–0.4)

## 2017-10-19 LAB — CBC AND DIFFERENTIAL
Hemoglobin: 14.9 (ref 13.5–17.5)
Platelets: 213 (ref 150–399)
WBC: 5.3

## 2017-10-28 ENCOUNTER — Other Ambulatory Visit: Payer: Self-pay | Admitting: Internal Medicine

## 2017-10-28 NOTE — Telephone Encounter (Signed)
Called patient and left message to return call to schedule follow-up appt.

## 2018-01-06 DIAGNOSIS — X32XXXD Exposure to sunlight, subsequent encounter: Secondary | ICD-10-CM | POA: Diagnosis not present

## 2018-01-06 DIAGNOSIS — Z85828 Personal history of other malignant neoplasm of skin: Secondary | ICD-10-CM | POA: Diagnosis not present

## 2018-01-06 DIAGNOSIS — D225 Melanocytic nevi of trunk: Secondary | ICD-10-CM | POA: Diagnosis not present

## 2018-01-06 DIAGNOSIS — Z08 Encounter for follow-up examination after completed treatment for malignant neoplasm: Secondary | ICD-10-CM | POA: Diagnosis not present

## 2018-01-06 DIAGNOSIS — L570 Actinic keratosis: Secondary | ICD-10-CM | POA: Diagnosis not present

## 2018-01-14 ENCOUNTER — Other Ambulatory Visit: Payer: Self-pay | Admitting: Internal Medicine

## 2018-01-14 NOTE — Telephone Encounter (Signed)
Patient needs to make an apt for more refills.

## 2018-02-09 ENCOUNTER — Other Ambulatory Visit: Payer: Self-pay | Admitting: Internal Medicine

## 2018-02-10 ENCOUNTER — Other Ambulatory Visit: Payer: Self-pay | Admitting: Internal Medicine

## 2018-02-10 NOTE — Telephone Encounter (Signed)
Sent to the pharmacy by e-scribe for 90 day supply. Has a new pt appt with Briscoe Deutscher on 03/15/18.

## 2018-02-13 ENCOUNTER — Other Ambulatory Visit: Payer: Self-pay | Admitting: Internal Medicine

## 2018-02-15 NOTE — Telephone Encounter (Signed)
Patient need to schedule an ov for more refills. 

## 2018-03-11 DIAGNOSIS — R972 Elevated prostate specific antigen [PSA]: Secondary | ICD-10-CM | POA: Diagnosis not present

## 2018-03-11 LAB — PSA
PSA: 3.23
PSA: 3.23

## 2018-03-14 NOTE — Progress Notes (Signed)
Eric Stokes is a 72 y.o. male is here to [facility].   Patient Care Team: Briscoe Deutscher, DO as PCP - General (Family Medicine) Rod Can, MD as Consulting Physician (Orthopedic Surgery) Georgena Spurling as Physician Assistant (Cardiology) Juanita Craver, MD as Consulting Physician (Gastroenterology) Levy Sjogren, MD as Referring Physician (Dermatology) Festus Aloe, MD as Consulting Physician (Urology)   History of Present Illness:   HPI:   1. Benign essential hypertension.   Review: taking medications as instructed, no TIAs, no chest pain on exertion, no swelling of ankles.   Smoker: No.  Wt Readings from Last 3 Encounters:  03/15/18 246 lb (111.6 kg)  02/04/17 250 lb (113.4 kg)  01/23/17 247 lb 6.4 oz (112.2 kg)   BP Readings from Last 3 Encounters:  03/15/18 140/60  02/04/17 132/72  01/23/17 (!) 158/84   Lab Results  Component Value Date   CREATININE 0.91 03/15/2018    2. Dyslipidemia, on Pravastatin  Is the patient taking medications without problems? [x]   YES  []   NO Does the patient complain of muscle aches?   []   YES  [x]    NO Trying to exercise on a regular basis? [x]   YES  []   NO Diet Compliance: compliant all of the time.  Cardiovascular ROS: no chest pain or dyspnea on exertion.   Lipids:    Component Value Date/Time   CHOL 185 03/15/2018 0926   TRIG 124.0 03/15/2018 0926   HDL 45.50 03/15/2018 0926   LDLDIRECT 160.8 09/06/2013 0958   VLDL 24.8 03/15/2018 0926   CHOLHDL 4 03/15/2018 0926    3. Malaise and fatigue. Followed by Cardiology. Hx of bradycardia. No CP.     Health Maintenance Due  Topic Date Due  . COLONOSCOPY  06/12/1995   Depression screen PHQ 2/9 03/15/2018  Decreased Interest 0  Down, Depressed, Hopeless 0  PHQ - 2 Score 0  Altered sleeping 3  Tired, decreased energy 3  Change in appetite 1  Feeling bad or failure about yourself  0  Trouble concentrating 0  Moving slowly or  fidgety/restless 0  Suicidal thoughts 0  PHQ-9 Score 7  Difficult doing work/chores Not difficult at all    PMHx, SurgHx, SocialHx, Medications, and Allergies were reviewed in the Visit Navigator and updated as appropriate.   Past Medical History:  Diagnosis Date  . Abnormal EKG   . BPH (benign prostatic hyperplasia) 09/03/2012  . Dyslipidemia 12/21/2008   Qualifier: Diagnosis of  By: Percival Spanish, MD, Farrel Gordon    . Elevated PSA 09/03/2012  . Essential hypertension, benign 12/21/2008   Qualifier: Diagnosis of  By: Percival Spanish, MD, Farrel Gordon    . Hx of colonic polyps 09/03/2012   Last colonoscopy March 2012 by Dr. Collene Mares   . Obesity, unspecified 12/21/2008   Qualifier: Diagnosis of  By: Percival Spanish, MD, Farrel Gordon    . OSA on CPAP 09/25/2015   Diagnosed at the [facility] 2016     Past Surgical History:  Procedure Laterality Date  . CHOLECYSTECTOMY  2001  . TONSILLECTOMY AND ADENOIDECTOMY  1952   Family History  Problem Relation Age of Onset  . Diabetes Mother   . COPD Father   . Stroke Paternal Grandfather    Social History   Tobacco Use  . Smoking status: Former Smoker    Types: Cigarettes    Last attempt to quit: 08/18/1965    Years since quitting: 52.6  . Smokeless tobacco: Never Used  Substance  Use Topics  . Alcohol use: Yes    Alcohol/week: 1.2 - 3.6 oz    Types: 2 - 6 Cans of beer per week  . Drug use: No   Current Medications and Allergies:   .  albuterol (PROVENTIL HFA;VENTOLIN HFA) 108 (90 Base) MCG/ACT inhaler, Inhale 2 puffs into the lungs every 6 (six) hours as needed for wheezing or shortness of breath., Disp: , Rfl:  .  amLODipine (NORVASC) 5 MG tablet, TAKE 1 TABLET BY MOUTH EVERY DAY, Disp: 30 tablet, Rfl: 0 .  ARTIFICIAL TEAR OP, Apply to eye., Disp: , Rfl:  .  aspirin 81 MG EC tablet, Take 81 mg by mouth daily. , Disp: , Rfl:  .  cetirizine (ZYRTEC) 10 MG tablet, Take 10 mg by mouth daily. , Disp: , Rfl:  .  Cholecalciferol (VITAMIN D3) 2000 UNITS TABS,  Take 2,000 Int'l Units by mouth daily. , Disp: , Rfl:  .  fluticasone (FLONASE) 50 MCG/ACT nasal spray, USE TWO SPRAYS IN EACH NOSTRIL DAILY AS NEEDED., Disp: 16 g, Rfl: 5 .  ibuprofen (ADVIL,MOTRIN) 200 MG tablet, Take 400 mg by mouth at bedtime., Disp: , Rfl:  .  latanoprost (XALATAN) 0.005 % ophthalmic solution, Place 1 drop into both eyes at bedtime., Disp: , Rfl:  .  losartan (COZAAR) 50 MG tablet, TAKE ONE-HALF TABLET BY MOUTH DAILY, Disp: 45 tablet, Rfl: 0 .  montelukast (SINGULAIR) 10 MG tablet, TAKE 1 TABLET BY MOUTH EVERY DAY, Disp: 30 tablet, Rfl: 0 .  pravastatin (PRAVACHOL) 20 MG tablet, TAKE ONE TABLET BY MOUTH ONE TIME DAILY (Patient taking differently: take one po three time week), Disp: 90 tablet, Rfl: 1   Allergies  Allergen Reactions  . Lipitor [Atorvastatin] Other (See Comments)    Leg Cramps  . Lisinopril Cough   Review of Systems:   Pertinent items are noted in the HPI. Otherwise, ROS is negative.  Vitals:   Vitals:   03/15/18 0845  BP: 140/60  Pulse: 60  Temp: 97.8 F (36.6 C)  TempSrc: Oral  SpO2: 97%  Weight: 246 lb (111.6 kg)  Height: 5\' 8"  (1.727 m)     Body mass index is 37.4 kg/m.  Physical Exam:   Physical Exam  Constitutional: He is oriented to person, place, and time. He appears well-developed and well-nourished. No distress.  HENT:  Head: Normocephalic and atraumatic.  Right Ear: External ear normal.  Left Ear: External ear normal.  Nose: Nose normal.  Mouth/Throat: Oropharynx is clear and moist.  Eyes: Pupils are equal, round, and reactive to light. Conjunctivae and EOM are normal.  Neck: Normal range of motion. Neck supple.  Cardiovascular: Normal rate, regular rhythm, normal heart sounds and intact distal pulses.  Pulmonary/Chest: Effort normal and breath sounds normal.  Abdominal: Soft. Bowel sounds are normal.  Musculoskeletal: Normal range of motion.  Neurological: He is alert and oriented to person, place, and time.  Skin:  Skin is warm and dry.  Psychiatric: He has a normal mood and affect. His behavior is normal. Judgment and thought content normal.  Nursing note and vitals reviewed.   Assessment and Plan:   Osiris was seen today for establish care.  Diagnoses and all orders for this visit:  OSA on CPAP  Benign essential hypertension -     amLODipine (NORVASC) 5 MG tablet; Take 1 tablet (5 mg total) by mouth daily. -     losartan (COZAAR) 50 MG tablet; Take 0.5 tablets (25 mg total) by mouth daily. -  CBC with Differential/Platelet -     Comprehensive metabolic panel  Seasonal allergic rhinitis due to pollen -     fluticasone (FLONASE) 50 MCG/ACT nasal spray; USE TWO SPRAYS IN EACH NOSTRIL DAILY AS NEEDED.  Hearing loss, unspecified hearing loss type, unspecified laterality  Dyslipidemia -     pravastatin (PRAVACHOL) 20 MG tablet; Take 1 tablet (20 mg total) by mouth daily. -     Lipid panel  Vitamin D deficiency  Abnormal EKG  Former smoker, stopped smoking in distant past  Mild intermittent asthma without complication -     albuterol (PROVENTIL HFA;VENTOLIN HFA) 108 (90 Base) MCG/ACT inhaler; Inhale 2 puffs into the lungs every 6 (six) hours as needed for wheezing or shortness of breath.  Malaise and fatigue -     Vitamin B12 -     TSH    . Reviewed expectations re: course of current medical issues. . Discussed self-management of symptoms. . Outlined signs and symptoms indicating need for more acute intervention. . Patient verbalized understanding and all questions were answered. Marland Kitchen Health Maintenance issues including appropriate healthy diet, exercise, and smoking avoidance were discussed with patient. . See orders for this visit as documented in the electronic medical record. . Patient received an After Visit Summary.  Briscoe Deutscher, DO [place] 03/17/2018

## 2018-03-15 ENCOUNTER — Ambulatory Visit (INDEPENDENT_AMBULATORY_CARE_PROVIDER_SITE_OTHER): Payer: Medicare Other | Admitting: Family Medicine

## 2018-03-15 ENCOUNTER — Other Ambulatory Visit: Payer: Self-pay

## 2018-03-15 ENCOUNTER — Encounter: Payer: Self-pay | Admitting: Family Medicine

## 2018-03-15 ENCOUNTER — Telehealth: Payer: Self-pay | Admitting: Family Medicine

## 2018-03-15 VITALS — BP 140/60 | HR 60 | Temp 97.8°F | Ht 68.0 in | Wt 246.0 lb

## 2018-03-15 DIAGNOSIS — R972 Elevated prostate specific antigen [PSA]: Secondary | ICD-10-CM

## 2018-03-15 DIAGNOSIS — J301 Allergic rhinitis due to pollen: Secondary | ICD-10-CM

## 2018-03-15 DIAGNOSIS — R5383 Other fatigue: Secondary | ICD-10-CM | POA: Diagnosis not present

## 2018-03-15 DIAGNOSIS — Z87891 Personal history of nicotine dependence: Secondary | ICD-10-CM | POA: Insufficient documentation

## 2018-03-15 DIAGNOSIS — J4520 Mild intermittent asthma, uncomplicated: Secondary | ICD-10-CM

## 2018-03-15 DIAGNOSIS — R9431 Abnormal electrocardiogram [ECG] [EKG]: Secondary | ICD-10-CM

## 2018-03-15 DIAGNOSIS — H9190 Unspecified hearing loss, unspecified ear: Secondary | ICD-10-CM | POA: Diagnosis not present

## 2018-03-15 DIAGNOSIS — R5381 Other malaise: Secondary | ICD-10-CM

## 2018-03-15 DIAGNOSIS — E785 Hyperlipidemia, unspecified: Secondary | ICD-10-CM | POA: Diagnosis not present

## 2018-03-15 DIAGNOSIS — I10 Essential (primary) hypertension: Secondary | ICD-10-CM

## 2018-03-15 DIAGNOSIS — G4733 Obstructive sleep apnea (adult) (pediatric): Secondary | ICD-10-CM

## 2018-03-15 DIAGNOSIS — Z9989 Dependence on other enabling machines and devices: Secondary | ICD-10-CM

## 2018-03-15 DIAGNOSIS — E559 Vitamin D deficiency, unspecified: Secondary | ICD-10-CM

## 2018-03-15 HISTORY — DX: Personal history of nicotine dependence: Z87.891

## 2018-03-15 LAB — COMPREHENSIVE METABOLIC PANEL
ALT: 13 U/L (ref 0–53)
AST: 12 U/L (ref 0–37)
Albumin: 4.2 g/dL (ref 3.5–5.2)
Alkaline Phosphatase: 44 U/L (ref 39–117)
BUN: 21 mg/dL (ref 6–23)
CO2: 27 mEq/L (ref 19–32)
Calcium: 9.2 mg/dL (ref 8.4–10.5)
Chloride: 106 mEq/L (ref 96–112)
Creatinine, Ser: 0.91 mg/dL (ref 0.40–1.50)
GFR: 86.86 mL/min (ref >60.00)
Glucose, Bld: 140 mg/dL — ABNORMAL HIGH (ref 70–99)
Potassium: 3.8 mEq/L (ref 3.5–5.1)
Sodium: 141 mEq/L (ref 135–145)
Total Bilirubin: 0.8 mg/dL (ref 0.2–1.2)
Total Protein: 7 g/dL (ref 6.0–8.3)

## 2018-03-15 LAB — CBC WITH DIFFERENTIAL/PLATELET
Basophils Absolute: 0 10*3/uL (ref 0.0–0.1)
Basophils Relative: 0.7 % (ref 0.0–3.0)
Eosinophils Absolute: 0.1 10*3/uL (ref 0.0–0.7)
Eosinophils Relative: 2 % (ref 0.0–5.0)
HCT: 41.8 % (ref 39.0–52.0)
Hemoglobin: 14.5 g/dL (ref 13.0–17.0)
Lymphocytes Relative: 30.4 % (ref 12.0–46.0)
Lymphs Abs: 1.5 10*3/uL (ref 0.7–4.0)
MCHC: 34.6 g/dL (ref 30.0–36.0)
MCV: 88.4 fl (ref 78.0–100.0)
Monocytes Absolute: 0.4 10*3/uL (ref 0.1–1.0)
Monocytes Relative: 7.7 % (ref 3.0–12.0)
Neutro Abs: 2.9 10*3/uL (ref 1.4–7.7)
Neutrophils Relative %: 59.2 % (ref 43.0–77.0)
Platelets: 189 10*3/uL (ref 150.0–400.0)
RBC: 4.73 Mil/uL (ref 4.22–5.81)
RDW: 14.1 % (ref 11.5–15.5)
WBC: 4.9 10*3/uL (ref 4.0–10.5)

## 2018-03-15 LAB — LIPID PANEL
Cholesterol: 185 mg/dL (ref 0–200)
HDL: 45.5 mg/dL (ref >39.00)
LDL Cholesterol: 114 mg/dL — ABNORMAL HIGH (ref 0–99)
NonHDL: 139.08
Total CHOL/HDL Ratio: 4
Triglycerides: 124 mg/dL (ref 0.0–149.0)
VLDL: 24.8 mg/dL (ref 0.0–40.0)

## 2018-03-15 LAB — TSH: TSH: 1.97 u[IU]/mL (ref 0.35–4.50)

## 2018-03-15 LAB — VITAMIN B12: Vitamin B-12: 159 pg/mL — ABNORMAL LOW (ref 211–911)

## 2018-03-15 MED ORDER — MONTELUKAST SODIUM 10 MG PO TABS: 10.0000 mg | ORAL_TABLET | Freq: Every day | ORAL | 3 refills | Status: DC

## 2018-03-15 MED ORDER — ALBUTEROL SULFATE HFA 108 (90 BASE) MCG/ACT IN AERS: 2.0000 | INHALATION_SPRAY | Freq: Four times a day (QID) | RESPIRATORY_TRACT | 3 refills | Status: DC | PRN

## 2018-03-15 MED ORDER — AMLODIPINE BESYLATE 5 MG PO TABS: 5.0000 mg | ORAL_TABLET | Freq: Every day | ORAL | 1 refills | Status: DC

## 2018-03-15 MED ORDER — LOSARTAN POTASSIUM 50 MG PO TABS: 25.0000 mg | ORAL_TABLET | Freq: Every day | ORAL | 0 refills | Status: DC

## 2018-03-15 MED ORDER — FLUTICASONE PROPIONATE 50 MCG/ACT NA SUSP: NASAL | 1 refills | Status: DC

## 2018-03-15 MED ORDER — PRAVASTATIN SODIUM 20 MG PO TABS: 20.0000 mg | ORAL_TABLET | Freq: Every day | ORAL | 1 refills | Status: DC

## 2018-03-15 NOTE — Telephone Encounter (Signed)
Singulair refill.  Last Refill:02/17/18 #30. Last filled by a different provider. Last OV: 03/15/18 PCP: Dr. Juleen China Pharmacy: [facility] [address] [place]

## 2018-03-15 NOTE — Telephone Encounter (Signed)
Copied from [place] [id number]. Topic: Quick Communication - Rx Refill/Question >> Mar 15, 2018 10:14 AM Synthia Innocent wrote: Medication: montelukast (SINGULAIR) 10 MG tablet   Has the patient contacted their pharmacy? Yes.   (Agent: If no, request that the patient contact the pharmacy for the refill.) (Agent: If yes, when and what did the pharmacy advise?)  Preferred Pharmacy (with phone number or street name): [facility]  Agent: Please be advised that RX refills may take up to 3 business days. We ask that you follow-up with your pharmacy.

## 2018-03-15 NOTE — Telephone Encounter (Signed)
Refill sent in

## 2018-03-15 NOTE — Telephone Encounter (Signed)
See note

## 2018-03-17 DIAGNOSIS — R350 Frequency of micturition: Secondary | ICD-10-CM | POA: Diagnosis not present

## 2018-03-18 DIAGNOSIS — R350 Frequency of micturition: Secondary | ICD-10-CM | POA: Insufficient documentation

## 2018-03-19 DIAGNOSIS — J45909 Unspecified asthma, uncomplicated: Secondary | ICD-10-CM | POA: Insufficient documentation

## 2018-03-26 ENCOUNTER — Ambulatory Visit (INDEPENDENT_AMBULATORY_CARE_PROVIDER_SITE_OTHER): Payer: Medicare Other

## 2018-03-26 DIAGNOSIS — E538 Deficiency of other specified B group vitamins: Secondary | ICD-10-CM | POA: Diagnosis not present

## 2018-03-26 MED ORDER — CYANOCOBALAMIN 1000 MCG/ML IJ SOLN
1000.0000 ug | Freq: Once | INTRAMUSCULAR | Status: AC
  Administered 2018-03-26: 1000 ug via INTRAMUSCULAR

## 2018-03-26 NOTE — Progress Notes (Signed)
Per orders of Dr. Juleen China, injection of vitamin B12 1000 mcg given in left deltoid by Gertie Exon, CMA. Patient tolerated injection well.  Will schedule another injection for 1 week from now.

## 2018-04-06 ENCOUNTER — Encounter: Payer: Self-pay | Admitting: Surgical

## 2018-04-06 ENCOUNTER — Ambulatory Visit (INDEPENDENT_AMBULATORY_CARE_PROVIDER_SITE_OTHER): Payer: Medicare Other | Admitting: Surgical

## 2018-04-06 DIAGNOSIS — E538 Deficiency of other specified B group vitamins: Secondary | ICD-10-CM | POA: Diagnosis not present

## 2018-04-06 MED ORDER — CYANOCOBALAMIN 1000 MCG/ML IJ SOLN
1000.0000 ug | Freq: Once | INTRAMUSCULAR | Status: AC
  Administered 2018-04-06: 1000 ug via INTRAMUSCULAR

## 2018-04-06 NOTE — Progress Notes (Signed)
Patient came in today for his second B 12 injection. B 12 given in his right deltoid. Patient tolerated well. He is schedule for next injection for one week out.

## 2018-04-12 DIAGNOSIS — C44622 Squamous cell carcinoma of skin of right upper limb, including shoulder: Secondary | ICD-10-CM | POA: Diagnosis not present

## 2018-04-12 DIAGNOSIS — X32XXXD Exposure to sunlight, subsequent encounter: Secondary | ICD-10-CM | POA: Diagnosis not present

## 2018-04-12 DIAGNOSIS — M1711 Unilateral primary osteoarthritis, right knee: Secondary | ICD-10-CM | POA: Insufficient documentation

## 2018-04-12 DIAGNOSIS — L821 Other seborrheic keratosis: Secondary | ICD-10-CM | POA: Diagnosis not present

## 2018-04-12 DIAGNOSIS — L570 Actinic keratosis: Secondary | ICD-10-CM | POA: Diagnosis not present

## 2018-04-14 ENCOUNTER — Ambulatory Visit (INDEPENDENT_AMBULATORY_CARE_PROVIDER_SITE_OTHER): Payer: Medicare Other

## 2018-04-14 DIAGNOSIS — E538 Deficiency of other specified B group vitamins: Secondary | ICD-10-CM

## 2018-04-14 MED ORDER — CYANOCOBALAMIN 1000 MCG/ML IJ SOLN
1000.0000 ug | Freq: Once | INTRAMUSCULAR | Status: AC
  Administered 2018-04-14: 1000 ug via INTRAMUSCULAR

## 2018-04-14 NOTE — Progress Notes (Signed)
Per orders of Dr. Juleen China , injection of b-12 given by Francella Solian. Patient tolerated injection well. Given in left deltoid. Patient will make next appointment the way out.

## 2018-04-22 ENCOUNTER — Ambulatory Visit: Payer: Medicare Other | Admitting: Cardiology

## 2018-04-22 ENCOUNTER — Encounter: Payer: Self-pay | Admitting: Cardiology

## 2018-04-22 VITALS — BP 130/66 | HR 63 | Ht 68.5 in | Wt 249.8 lb

## 2018-04-22 DIAGNOSIS — R9431 Abnormal electrocardiogram [ECG] [EKG]: Secondary | ICD-10-CM | POA: Diagnosis not present

## 2018-04-22 DIAGNOSIS — I493 Ventricular premature depolarization: Secondary | ICD-10-CM

## 2018-04-22 NOTE — Patient Instructions (Signed)
Medication Instructions:  Your physician recommends that you continue on your current medications as directed. Please refer to the Current Medication list given to you today.   Labwork: -None  Testing/Procedures: -None  Follow-Up: As needed  Any Other Special Instructions Will Be Listed Below (If Applicable).     If you need a refill on your cardiac medications before your next appointment, please call your pharmacy.

## 2018-04-22 NOTE — Progress Notes (Signed)
04/22/2018 Eric Stokes   05-Oct-1945  [id number]  Primary Physician Briscoe Deutscher, DO Primary Cardiologist: Dr. Burt Knack   Reason for Visit/CC: f/u given h/o bradycardia and PVCs  HPI:  Eric Stokes is a 72 y.o. male who presents to clinic for f/u given history of bradycardia and irregular heart beat. He was initially referred by his PCP in 2018.   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 His PMH is notable for treated HTN. He is on amlodipien and losartan. He is not on a BB. He has no documented h/o CAD. He had an ETT at the [place] in [place] in 2015 that was negative for ischemia. He has HLD and this is followed by his PCP. He is on Pravastatin 20 mg. His has OSA and compliant w/ CPAP. 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   He established care with our practice for the first time in 2018 after his PCP referred him for evaluation given fatigue, bradycardia and an irregular HR.  He denied any associated chest pain and no syncope/ near syncope.  At the time, patient was having resting heart rates in the low 50s without being on any AV nodal blocking agents. His PCP was concerned that his bradycardia and PVCs were potential causes of his fatigue.  His PCP had obtain basic labs including electrolytes, CBC and TSH, all of which were within normal limits.   I evaluated him on 01/23/17, along with Dr. Burt Knack, who was DOD. At that visit, BP was 158/84. EKG showed mild sinus bradycardia, 56 bpm with PVCs. Dr. Burt Knack recommended an ETT to assess HR response to exercise as well as a 24 hr holter monitor to assess the degree of his bradycardia.   His ETT was normal without any ischemia. He had an appropriate increase in HR with exercise.  Cardiac monitor was also fairly benign showing sinus rhythm with periods of bradycardia but no pathologic pauses.  There were premature ventricular single beats and rare couplets as well as premature supraventricular beats and short runs of 3-4  beats.  There was no evidence of atrial fibrillation or flutter.  No further cardiac work-up was advised at that time.  It was recommended that the patient avoid being treated with AV nodal blocking agents in the future.  He now presents back to clinic for one-year follow-up.  He reports that he has done well. He still has some fatigue, but no chest pain, dyspnea, syncope/ near syncope or palpations.  He is very active around his house and does all of his yard work, which requires a moderate amount of exertion.  He denies any symptoms or limitations with this.  EKG today shows SR with sinus arrhthymias w/ occasional PVCs, 63bpm. BP is controlled at 130/66. He reports full compliance with CPAP.   Cardiac Studies  Exercise tolerance test 07-Mar-2017 Study Highlights     Blood pressure demonstrated a hypertensive response to exercise.  There was no ST segment deviation noted during stress.   HTN response to exercise Only achieved 86% of PMHR No ischemia      Holter Monitor Mar 07, 2017  Study Highlights   The basic rhythm is sinus There are periods of bradycardia but no pathologic pauses Premature ventricular single beats and rare couplets Premature supraventricular beats and short runs of 3-4 beats No evidence of atrial fibrillation or flutter     Current Meds  Medication Sig  . albuterol (PROVENTIL HFA;VENTOLIN HFA)  108 (90 Base) MCG/ACT inhaler Inhale 2 puffs into the lungs every 6 (six) hours as needed for wheezing or shortness of breath.  Marland Kitchen amLODipine (NORVASC) 5 MG tablet Take 1 tablet (5 mg total) by mouth daily.  Marland Kitchen aspirin 81 MG EC tablet Take 81 mg by mouth daily.   . cetirizine (ZYRTEC) 10 MG tablet Take 10 mg by mouth daily.   . Cholecalciferol (VITAMIN D3) 2000 UNITS TABS Take 2,000 Int'l Units by mouth daily.   . fluticasone (FLONASE) 50 MCG/ACT nasal spray USE TWO SPRAYS IN EACH NOSTRIL DAILY AS NEEDED.  Marland Kitchen ibuprofen (ADVIL,MOTRIN) 200 MG tablet Take 400 mg by mouth at  bedtime.  Marland Kitchen latanoprost (XALATAN) 0.005 % ophthalmic solution Place 1 drop into both eyes at bedtime.  Marland Kitchen losartan (COZAAR) 50 MG tablet Take 0.5 tablets (25 mg total) by mouth daily.  . montelukast (SINGULAIR) 10 MG tablet Take 1 tablet (10 mg total) by mouth daily.  . pravastatin (PRAVACHOL) 20 MG tablet Take 1 tablet (20 mg total) by mouth daily.   Allergies  Allergen Reactions  . Lipitor [Atorvastatin] Other (See Comments)    Leg Cramps  . Lisinopril Cough   Past Medical History:  Diagnosis Date  . Abnormal EKG   . Benign essential hypertension, on Norvasc and Amlodipine 12/21/2008   Followed by [facility]   . BPH (benign prostatic hyperplasia) 09/03/2012  . Dyslipidemia 12/21/2008   Qualifier: Diagnosis of  By: Percival Spanish, MD, Farrel Gordon    . Elevated PSA 09/03/2012  . Essential hypertension, benign 12/21/2008   Qualifier: Diagnosis of  By: Percival Spanish, MD, Farrel Gordon    . Former smoker, stopped smoking in distant past 03/15/2018  . Hematuria 09/16/2011  . Hx of colonic polyps 09/03/2012   Last colonoscopy March 2012 by Dr. Collene Mares   . Obesity, unspecified 12/21/2008   Qualifier: Diagnosis of  By: Percival Spanish, MD, Farrel Gordon    . OSA on CPAP 09/25/2015   Diagnosed at the [facility] 2016   . Vitamin D deficiency 11/26/2010   Family History  Problem Relation Age of Onset  . Diabetes Mother   . COPD Father   . Stroke Paternal Grandfather    Past Surgical History:  Procedure Laterality Date  . CHOLECYSTECTOMY  2001  . TONSILLECTOMY AND ADENOIDECTOMY  1952   Social History   Socioeconomic History  . Marital status: Married    Spouse name: Not on file  . Number of children: Not on file  . Years of education: Not on file  . Highest education level: Not on file  Occupational History  . Not on file  Social Needs  . Financial resource strain: Not on file  . Food insecurity:    Worry: Not on file    Inability: Not on file  . Transportation needs:    Medical: Not on file     Non-medical: Not on file  Tobacco Use  . Smoking status: Former Smoker    Types: Cigarettes    Last attempt to quit: 08/18/1965    Years since quitting: 52.7  . Smokeless tobacco: Never Used  Substance and Sexual Activity  . Alcohol use: Yes    Alcohol/week: 2.0 - 6.0 standard drinks    Types: 2 - 6 Cans of beer per week  . Drug use: No  . Sexual activity: Not on file  Lifestyle  . Physical activity:    Days per week: Not on file    Minutes per session: Not on file  .  Stress: Not on file  Relationships  . Social connections:    Talks on phone: Not on file    Gets together: Not on file    Attends religious service: Not on file    Active member of club or organization: Not on file    Attends meetings of clubs or organizations: Not on file    Relationship status: Not on file  . Intimate partner violence:    Fear of current or ex partner: Not on file    Emotionally abused: Not on file    Physically abused: Not on file    Forced sexual activity: Not on file  Other Topics Concern  . Not on file  Social History Narrative  . Not on file     Review of Systems: General: negative for chills, fever, night sweats or weight changes.  Cardiovascular: negative for chest pain, dyspnea on exertion, edema, orthopnea, palpitations, paroxysmal nocturnal dyspnea or shortness of breath Dermatological: negative for rash Respiratory: negative for cough or wheezing Urologic: negative for hematuria Abdominal: negative for nausea, vomiting, diarrhea, bright red blood per rectum, melena, or hematemesis Neurologic: negative for visual changes, syncope, or dizziness All other systems reviewed and are otherwise negative except as noted above.   Physical Exam:  Blood pressure 130/66, pulse 63, height 5' 8.5" (1.74 m), weight 249 lb 12.8 oz (113.3 kg).  General appearance: alert, cooperative, no distress and moderately obese Neck: no carotid bruit and no JVD Lungs: clear to auscultation  bilaterally Heart: regular rate and rhythm, S1, S2 normal, no murmur, click, rub or gallop Extremities: extremities normal, atraumatic, no cyanosis or edema Pulses: 2+ and symmetric Skin: Skin color, texture, turgor normal. No rashes or lesions Neurologic: Grossly normal  EKG SR with sinus arrhthymias w/ occasional PVCs, 63bpm -- personally reviewed   ASSESSMENT AND PLAN:   1. Sinus Bradycardia: Holter monitor in 2018 showed only mild bradycardia with no pauses.  ETT negative for ischemia. EKG today SR with sinus arrhthymias w/ occasional PVCs. HR 63 bpm. Avoid AV nodal blocking agents  2. H/o PVCs: Monitor in 2018 showed only rare PVCs.  Low burden.  We are avoiding AV nodal blocking agents given history of bradycardia. EKG today is benign.   3. HTN: controlled on current regimen. 130/66 today.   4. HLD: lipids followed by PCP. He is on statin therapy with Pravastatin.   5. OSA: compliant w/ CPAP therapy.   No further cardiac work-up indicated at this time.  Continue routine follow-up and preventative care through PCP.  Follow-Up: Follow-up as needed  Brittainy Ladoris Gene, MHS [facility] 04/22/2018 9:26 AM

## 2018-05-18 ENCOUNTER — Ambulatory Visit (INDEPENDENT_AMBULATORY_CARE_PROVIDER_SITE_OTHER): Payer: Medicare Other

## 2018-05-18 DIAGNOSIS — E538 Deficiency of other specified B group vitamins: Secondary | ICD-10-CM

## 2018-05-18 MED ORDER — CYANOCOBALAMIN 1000 MCG/ML IJ SOLN
1000.0000 ug | Freq: Once | INTRAMUSCULAR | Status: AC
  Administered 2018-05-18: 1000 ug via INTRAMUSCULAR

## 2018-05-18 NOTE — Progress Notes (Signed)
Per orders of Dr. Juleen China, injection of B-12 given by Francella Solian. Patient tolerated injection well. Injection given in right deltoid. Will make next appointment at check out.

## 2018-05-19 ENCOUNTER — Ambulatory Visit (INDEPENDENT_AMBULATORY_CARE_PROVIDER_SITE_OTHER): Payer: Medicare Other

## 2018-05-19 ENCOUNTER — Encounter: Payer: Self-pay | Admitting: Family Medicine

## 2018-05-19 DIAGNOSIS — Z23 Encounter for immunization: Secondary | ICD-10-CM | POA: Diagnosis not present

## 2018-06-14 NOTE — Progress Notes (Addendum)
Eric Stokes is a 72 y.o. male is here for follow up.  History of Present Illness:   HPI: See Assessment and Plan section for Problem Based Charting of issues discussed today.   Health Maintenance Due  Topic Date Due  . COLONOSCOPY  06/12/1995   Depression screen [facility] 2/9 03/15/2018 12/16/2016 09/25/2015  Decreased Interest 0 0 0  Down, Depressed, Hopeless 0 0 1  PHQ - 2 Score 0 0 1  Altered sleeping 3 - -  Tired, decreased energy 3 - -  Change in appetite 1 - -  Feeling bad or failure about yourself  0 - -  Trouble concentrating 0 - -  Moving slowly or fidgety/restless 0 - -  Suicidal thoughts 0 - -  PHQ-9 Score 7 - -  Difficult doing work/chores Not difficult at all - -   PMHx, SurgHx, SocialHx, FamHx, Medications, and Allergies were reviewed in the Visit Navigator and updated as appropriate.   Patient Active Problem List   Diagnosis Date Noted  . At risk for bradycardia 06/15/2018  . B12 deficiency, with monthly injections 06/15/2018  . Carotid bruit present 06/15/2018  . Primary osteoarthritis of right knee, followed by Orthopedics 04/12/2018  . Asthma, allergic, prn albuterol, triggered by perfumes 03/19/2018  . Former smoker, stopped smoking in distant past 03/15/2018  . OSA on CPAP 09/25/2015  . Elevated PSA 09/03/2012  . BPH (benign prostatic hyperplasia) 09/03/2012  . Hx of colonic polyps 09/03/2012  . Hematuria 09/16/2011  . Vitamin D deficiency 11/26/2010  . Allergic rhinitis, using Flonase 11/20/2010  . Hearing loss 11/20/2010  . Dyslipidemia, on Pravastatin 12/21/2008  . Benign essential hypertension 12/21/2008   Social History   Tobacco Use  . Smoking status: Former Smoker    Types: Cigarettes    Last attempt to quit: 08/18/1965    Years since quitting: 52.8  . Smokeless tobacco: Never Used  Substance Use Topics  . Alcohol use: Yes    Alcohol/week: 2.0 - 6.0 standard drinks    Types: 2 - 6 Cans of beer per week  . Drug use: No   Current  Medications and Allergies:   .  albuterol (PROVENTIL HFA;VENTOLIN HFA) 108 (90 Base) MCG/ACT inhaler, Inhale 2 puffs into the lungs every 6 (six) hours as needed for wheezing or shortness of breath., Disp: 3 Inhaler, Rfl: 3 TAKES RARELY .  amLODipine (NORVASC) 5 MG tablet, Take 1 tablet (5 mg total) by mouth daily., Disp: 90 tablet, Rfl: 1 .  aspirin 81 MG EC tablet, Take 81 mg by mouth daily. , Disp: , Rfl:  .  cetirizine (ZYRTEC) 10 MG tablet, Take 10 mg by mouth daily. , Disp: , Rfl:  .  Cholecalciferol (VITAMIN D3) 2000 UNITS TABS, Take 2,000 Int'l Units by mouth daily. , Disp: , Rfl:  .  fluticasone (FLONASE) 50 MCG/ACT nasal spray, USE TWO SPRAYS IN EACH NOSTRIL DAILY AS NEEDED., Disp: 48 g, Rfl: 1 .  ibuprofen (ADVIL,MOTRIN) 200 MG tablet, Take 400 mg by mouth at bedtime., Disp: , Rfl:  .  latanoprost (XALATAN) 0.005 % ophthalmic solution, Place 1 drop into both eyes at bedtime., Disp: , Rfl:  .  losartan (COZAAR) 50 MG tablet, Take 0.5 tablets (25 mg total) by mouth daily., Disp: 90 tablet, Rfl: 0 .  montelukast (SINGULAIR) 10 MG tablet, Take 1 tablet (10 mg total) by mouth daily., Disp: 30 tablet, Rfl: 3 .  pravastatin (PRAVACHOL) 20 MG tablet, Take 1 tablet (20 mg total) by  mouth daily., Disp: 90 tablet, Rfl: 1 TAKES THREE TIMES PER WEEK   Allergies  Allergen Reactions  . Lipitor [Atorvastatin] Other (See Comments)    Leg Cramps  . Lisinopril Cough   Review of Systems   Pertinent items are noted in the HPI. Otherwise, ROS is negative.  Vitals:   Vitals:   06/15/18 0721  BP: (!) 142/64  Pulse: (!) 49  Temp: 98.1 F (36.7 C)  TempSrc: Oral  SpO2: 99%  Weight: 248 lb 12.8 oz (112.9 kg)  Height: 5' 8.5" (1.74 m)     Body mass index is 37.28 kg/m.  Physical Exam:   Physical Exam  Constitutional: He is oriented to person, place, and time. He appears well-developed and well-nourished. No distress.  HENT:  Head: Normocephalic and atraumatic.  Right Ear: External ear  normal.  Left Ear: External ear normal.  Nose: Nose normal.  Mouth/Throat: Oropharynx is clear and moist.  Eyes: Pupils are equal, round, and reactive to light. Conjunctivae and EOM are normal.  Neck: Normal range of motion. Neck supple.  Cardiovascular: Regular rhythm, normal heart sounds and intact distal pulses.  Occasional extrasystoles are present. Bradycardia present.  Pulmonary/Chest: Effort normal and breath sounds normal.  Abdominal: Soft. Bowel sounds are normal.  Musculoskeletal: Normal range of motion.  Neurological: He is alert and oriented to person, place, and time.  Skin: Skin is warm and dry.  Psychiatric: He has a normal mood and affect. His behavior is normal. Judgment and thought content normal.  Nursing note and vitals reviewed.  Assessment and Plan:   Benign essential hypertension He continues to have bradycardia, fatigue. Will stop Norvasc. He will monitor BP and HR. Recheck vitals in one month.   Review: taking medications as instructed, no TIAs, no chest pain on exertion, no dyspnea on exertion, no swelling of ankles. Smoker: No.  Wt Readings from Last 3 Encounters:  04/22/18 249 lb 12.8 oz (113.3 kg)  03/15/18 246 lb (111.6 kg)  02/04/17 250 lb (113.4 kg)   BP Readings from Last 3 Encounters:  04/22/18 130/66  03/15/18 140/60  02/04/17 132/72   Lab Results  Component Value Date   CREATININE 0.91 03/15/2018    OSA on CPAP Compliant and without problems.  Primary osteoarthritis of right knee, followed by Orthopedics In the middle of Synvisc injections. Prefers no surgery as he will not be as mobile as he wants. Pain 30% improved with injections.   At risk for bradycardia Note reviewed from [facility]. Studies reassuring. Will stop Norvasc as above.   B12 deficiency, with monthly injections No symptomatic improvement. Will recheck lab today.  Elevated PSA Normalized.  Dyslipidemia, on Pravastatin Takes medication three times per week to  reduce myalgias. Will recheck labs today.   Orders Placed This Encounter  Procedures  . CBC with Differential/Platelet  . Comprehensive metabolic panel  . Vitamin B12   Meds ordered this encounter  Medications  . cyanocobalamin ((VITAMIN B-12)) injection 1,000 mcg   . Reviewed expectations re: course of current medical issues. . Discussed self-management of symptoms. . Outlined signs and symptoms indicating need for more acute intervention. . Patient verbalized understanding and all questions were answered. Marland Kitchen Health Maintenance issues including appropriate healthy diet, exercise, and smoking avoidance were discussed with patient. . See orders for this visit as documented in the electronic medical record. . Patient received an After Visit Summary.  CMA served as Education administrator during this visit. History, Physical, and Plan performed by medical provider. The above documentation  has been reviewed and is accurate and complete. Briscoe Deutscher, D.O.  Briscoe Deutscher, DO [place], [facility] 06/15/2018

## 2018-06-15 ENCOUNTER — Ambulatory Visit (INDEPENDENT_AMBULATORY_CARE_PROVIDER_SITE_OTHER): Payer: Medicare Other | Admitting: Family Medicine

## 2018-06-15 ENCOUNTER — Encounter: Payer: Self-pay | Admitting: Family Medicine

## 2018-06-15 VITALS — BP 142/64 | HR 49 | Temp 98.1°F | Ht 68.5 in | Wt 248.8 lb

## 2018-06-15 DIAGNOSIS — J4520 Mild intermittent asthma, uncomplicated: Secondary | ICD-10-CM

## 2018-06-15 DIAGNOSIS — G4733 Obstructive sleep apnea (adult) (pediatric): Secondary | ICD-10-CM

## 2018-06-15 DIAGNOSIS — E538 Deficiency of other specified B group vitamins: Secondary | ICD-10-CM | POA: Diagnosis not present

## 2018-06-15 DIAGNOSIS — E785 Hyperlipidemia, unspecified: Secondary | ICD-10-CM

## 2018-06-15 DIAGNOSIS — R0989 Other specified symptoms and signs involving the circulatory and respiratory systems: Secondary | ICD-10-CM

## 2018-06-15 DIAGNOSIS — Z9989 Dependence on other enabling machines and devices: Secondary | ICD-10-CM

## 2018-06-15 DIAGNOSIS — Z9189 Other specified personal risk factors, not elsewhere classified: Secondary | ICD-10-CM | POA: Diagnosis not present

## 2018-06-15 DIAGNOSIS — M1711 Unilateral primary osteoarthritis, right knee: Secondary | ICD-10-CM

## 2018-06-15 DIAGNOSIS — Z860100 Personal history of colon polyps, unspecified: Secondary | ICD-10-CM

## 2018-06-15 DIAGNOSIS — I10 Essential (primary) hypertension: Secondary | ICD-10-CM

## 2018-06-15 DIAGNOSIS — E559 Vitamin D deficiency, unspecified: Secondary | ICD-10-CM

## 2018-06-15 DIAGNOSIS — R972 Elevated prostate specific antigen [PSA]: Secondary | ICD-10-CM

## 2018-06-15 HISTORY — DX: Other specified symptoms and signs involving the circulatory and respiratory systems: R09.89

## 2018-06-15 LAB — CBC WITH DIFFERENTIAL/PLATELET
Basophils Absolute: 0 10*3/uL (ref 0.0–0.1)
Basophils Relative: 0.8 % (ref 0.0–3.0)
Eosinophils Absolute: 0.1 10*3/uL (ref 0.0–0.7)
Eosinophils Relative: 2.8 % (ref 0.0–5.0)
HCT: 42.7 % (ref 39.0–52.0)
Hemoglobin: 14.6 g/dL (ref 13.0–17.0)
Lymphocytes Relative: 31.2 % (ref 12.0–46.0)
Lymphs Abs: 1.5 10*3/uL (ref 0.7–4.0)
MCHC: 34.1 g/dL (ref 30.0–36.0)
MCV: 88.6 fl (ref 78.0–100.0)
Monocytes Absolute: 0.3 10*3/uL (ref 0.1–1.0)
Monocytes Relative: 7 % (ref 3.0–12.0)
Neutro Abs: 2.8 10*3/uL (ref 1.4–7.7)
Neutrophils Relative %: 58.2 % (ref 43.0–77.0)
Platelets: 201 10*3/uL (ref 150.0–400.0)
RBC: 4.82 Mil/uL (ref 4.22–5.81)
RDW: 14 % (ref 11.5–15.5)
WBC: 4.7 10*3/uL (ref 4.0–10.5)

## 2018-06-15 LAB — COMPREHENSIVE METABOLIC PANEL
ALT: 17 U/L (ref 0–53)
AST: 15 U/L (ref 0–37)
Albumin: 4.3 g/dL (ref 3.5–5.2)
Alkaline Phosphatase: 44 U/L (ref 39–117)
BUN: 22 mg/dL (ref 6–23)
CO2: 28 mEq/L (ref 19–32)
Calcium: 9.2 mg/dL (ref 8.4–10.5)
Chloride: 105 mEq/L (ref 96–112)
Creatinine, Ser: 0.98 mg/dL (ref 0.40–1.50)
GFR: 79.68 mL/min (ref >60.00)
Glucose, Bld: 125 mg/dL — ABNORMAL HIGH (ref 70–99)
Potassium: 3.8 mEq/L (ref 3.5–5.1)
Sodium: 140 mEq/L (ref 135–145)
Total Bilirubin: 0.6 mg/dL (ref 0.2–1.2)
Total Protein: 7.1 g/dL (ref 6.0–8.3)

## 2018-06-15 LAB — VITAMIN B12: Vitamin B-12: >1525 pg/mL — ABNORMAL HIGH (ref 211–911)

## 2018-06-15 MED ORDER — CYANOCOBALAMIN 1000 MCG/ML IJ SOLN
1000.0000 ug | Freq: Once | INTRAMUSCULAR | Status: AC
  Administered 2018-06-15: 1000 ug via INTRAMUSCULAR

## 2018-06-15 NOTE — Assessment & Plan Note (Addendum)
He continues to have bradycardia, fatigue. Will stop Norvasc. He will monitor BP and HR. Recheck vitals in one month.   Review: taking medications as instructed, no TIAs, no chest pain on exertion, no dyspnea on exertion, no swelling of ankles. Smoker: No.  Wt Readings from Last 3 Encounters:  04/22/18 249 lb 12.8 oz (113.3 kg)  03/15/18 246 lb (111.6 kg)  02/04/17 250 lb (113.4 kg)   BP Readings from Last 3 Encounters:  04/22/18 130/66  03/15/18 140/60  02/04/17 132/72   Lab Results  Component Value Date   CREATININE 0.91 03/15/2018

## 2018-06-15 NOTE — Patient Instructions (Signed)
HOLD THE AMLODIPINE AND RECORDS YOUR BLOOD PRESSURES AND HEART RATE.  OKAY TO TRIAL OFF ZYRTEC.

## 2018-06-15 NOTE — Assessment & Plan Note (Signed)
No symptomatic improvement. Will recheck lab today.

## 2018-06-15 NOTE — Assessment & Plan Note (Signed)
Compliant and without problems.

## 2018-06-15 NOTE — Assessment & Plan Note (Signed)
Takes medication three times per week to reduce myalgias. Will recheck labs today.

## 2018-06-15 NOTE — Assessment & Plan Note (Signed)
In the middle of Synvisc injections. Prefers no surgery as he will not be as mobile as he wants. Pain 30% improved with injections.

## 2018-06-15 NOTE — Assessment & Plan Note (Signed)
Normalized

## 2018-06-15 NOTE — Assessment & Plan Note (Signed)
Note reviewed from [place]. Studies reassuring. Will stop Norvasc as above.

## 2018-06-22 ENCOUNTER — Telehealth: Payer: Self-pay

## 2018-06-22 MED ORDER — LOSARTAN POTASSIUM 25 MG PO TABS: 25.0000 mg | ORAL_TABLET | Freq: Every day | ORAL | 0 refills | Status: DC

## 2018-06-22 NOTE — Telephone Encounter (Signed)
Fax received from pharmacy to change from 50mg  to the 25mg . 50mg  is on backorder and cant get in stock. I have called in new to pharmacy per script directions at 25mg  daily.

## 2018-06-29 ENCOUNTER — Other Ambulatory Visit: Payer: Self-pay | Admitting: Family Medicine

## 2018-07-19 ENCOUNTER — Ambulatory Visit: Payer: Medicare Other | Admitting: Family Medicine

## 2018-07-28 ENCOUNTER — Encounter: Payer: Self-pay | Admitting: Family Medicine

## 2018-07-28 ENCOUNTER — Ambulatory Visit (INDEPENDENT_AMBULATORY_CARE_PROVIDER_SITE_OTHER): Payer: Medicare Other | Admitting: Family Medicine

## 2018-07-28 VITALS — BP 136/68 | HR 69 | Temp 97.9°F | Ht 68.5 in | Wt 251.2 lb

## 2018-07-28 DIAGNOSIS — Z9189 Other specified personal risk factors, not elsewhere classified: Secondary | ICD-10-CM

## 2018-07-28 DIAGNOSIS — J301 Allergic rhinitis due to pollen: Secondary | ICD-10-CM | POA: Diagnosis not present

## 2018-07-28 DIAGNOSIS — E785 Hyperlipidemia, unspecified: Secondary | ICD-10-CM | POA: Diagnosis not present

## 2018-07-28 DIAGNOSIS — I10 Essential (primary) hypertension: Secondary | ICD-10-CM

## 2018-07-28 DIAGNOSIS — E538 Deficiency of other specified B group vitamins: Secondary | ICD-10-CM

## 2018-07-28 DIAGNOSIS — J4520 Mild intermittent asthma, uncomplicated: Secondary | ICD-10-CM | POA: Diagnosis not present

## 2018-07-28 DIAGNOSIS — I493 Ventricular premature depolarization: Secondary | ICD-10-CM

## 2018-07-28 DIAGNOSIS — R739 Hyperglycemia, unspecified: Secondary | ICD-10-CM

## 2018-07-28 LAB — COMPREHENSIVE METABOLIC PANEL
ALT: 11 U/L (ref 0–53)
AST: 13 U/L (ref 0–37)
Albumin: 4.2 g/dL (ref 3.5–5.2)
Alkaline Phosphatase: 43 U/L (ref 39–117)
BUN: 18 mg/dL (ref 6–23)
CO2: 26 mEq/L (ref 19–32)
Calcium: 9.1 mg/dL (ref 8.4–10.5)
Chloride: 106 mEq/L (ref 96–112)
Creatinine, Ser: 0.88 mg/dL (ref 0.40–1.50)
GFR: 90.19 mL/min (ref >60.00)
Glucose, Bld: 110 mg/dL — ABNORMAL HIGH (ref 70–99)
Potassium: 4 mEq/L (ref 3.5–5.1)
Sodium: 139 mEq/L (ref 135–145)
Total Bilirubin: 0.9 mg/dL (ref 0.2–1.2)
Total Protein: 6.8 g/dL (ref 6.0–8.3)

## 2018-07-28 LAB — HEMOGLOBIN A1C: Hgb A1c MFr Bld: 5.8 % (ref 4.6–6.5)

## 2018-07-28 MED ORDER — PRAVASTATIN SODIUM 20 MG PO TABS: 20.0000 mg | ORAL_TABLET | ORAL | 1 refills | Status: DC

## 2018-07-28 MED ORDER — ALBUTEROL SULFATE HFA 108 (90 BASE) MCG/ACT IN AERS: 2.0000 | INHALATION_SPRAY | Freq: Four times a day (QID) | RESPIRATORY_TRACT | 3 refills | Status: DC | PRN

## 2018-07-28 MED ORDER — MONTELUKAST SODIUM 10 MG PO TABS: 10.0000 mg | ORAL_TABLET | Freq: Every day | ORAL | 1 refills | Status: DC

## 2018-07-28 MED ORDER — LOSARTAN POTASSIUM 25 MG PO TABS: 25.0000 mg | ORAL_TABLET | Freq: Every day | ORAL | 0 refills | Status: DC

## 2018-07-28 MED ORDER — FLUTICASONE PROPIONATE 50 MCG/ACT NA SUSP: NASAL | 1 refills | Status: DC

## 2018-07-28 MED ORDER — CYANOCOBALAMIN 1000 MCG/ML IJ SOLN
1000.0000 ug | Freq: Once | INTRAMUSCULAR | Status: AC
  Administered 2018-07-28: 1000 ug via INTRAMUSCULAR

## 2018-07-28 NOTE — Progress Notes (Signed)
KARSON REEDE is a 72 y.o. male is here for follow up.  History of Present Illness:  . [name], CMA acting as scribe for Dr. Briscoe Deutscher.   HPI:   Benign essential hypertension Norvasc discontinued at last visit. He was instructed to monitor BP and HR. Brought log for review in office today Review: taking medications as instructed, no TIAs, no chest pain on exertion, no dyspnea on exertion, no swelling of ankles. Smoker: No.  OSA on CPAP Compliant and without problems.  Primary osteoarthritis of right knee, followed by Orthopedics Prefers no surgery as he will not be as mobile as he wants. Pain 30% improved with injections.   B12 deficiency, with monthly injections Will get injection in office today.   Dyslipidemia, on Pravastatin Takes medication three times per week to reduce myalgias.   Health Maintenance Due  Topic Date Due  . COLONOSCOPY  06/12/1995   Depression screen [facility] 2/9 07/28/2018 03/15/2018 12/16/2016  Decreased Interest 1 0 0  Down, Depressed, Hopeless 0 0 0  PHQ - 2 Score 1 0 0  Altered sleeping 3 3 -  Tired, decreased energy 3 3 -  Change in appetite 1 1 -  Feeling bad or failure about yourself  0 0 -  Trouble concentrating 0 0 -  Moving slowly or fidgety/restless 0 0 -  Suicidal thoughts 0 0 -  PHQ-9 Score 8 7 -  Difficult doing work/chores Not difficult at all Not difficult at all -   PMHx, SurgHx, SocialHx, FamHx, Medications, and Allergies were reviewed in the Visit Navigator and updated as appropriate.   Patient Active Problem List   Diagnosis Date Noted  . At risk for bradycardia 06/15/2018  . B12 deficiency, with monthly injections 06/15/2018  . Carotid bruit present 06/15/2018  . Primary osteoarthritis of right knee, followed by Orthopedics 04/12/2018  . Asthma, allergic, prn albuterol, triggered by perfumes 03/19/2018  . Former smoker, stopped smoking in distant past 03/15/2018  . OSA on CPAP 09/25/2015  . Elevated PSA  09/03/2012  . BPH (benign prostatic hyperplasia) 09/03/2012  . Hx of colonic polyps 09/03/2012  . Hematuria 09/16/2011  . Vitamin D deficiency 11/26/2010  . Allergic rhinitis, using Flonase 11/20/2010  . Hearing loss 11/20/2010  . Dyslipidemia, on Pravastatin 12/21/2008  . Benign essential hypertension 12/21/2008   Social History   Tobacco Use  . Smoking status: Former Smoker    Types: Cigarettes    Last attempt to quit: 08/18/1965    Years since quitting: 53.0  . Smokeless tobacco: Never Used  Substance Use Topics  . Alcohol use: Yes    Alcohol/week: 2.0 - 6.0 standard drinks    Types: 2 - 6 Cans of beer per week  . Drug use: No   Current Medications and Allergies:   .  albuterol (PROVENTIL HFA;VENTOLIN HFA) 108 (90 Base) MCG/ACT inhaler, Inhale 2 puffs into the lungs every 6 (six) hours as needed for wheezing or shortness of breath., Disp: 3 Inhaler, Rfl: 3 .  amLODipine (NORVASC) 5 MG tablet, Take 1 tablet (5 mg total) by mouth daily., Disp: 90 tablet, Rfl: 1 .  aspirin 81 MG EC tablet, Take 81 mg by mouth daily. , Disp: , Rfl:  .  cetirizine (ZYRTEC) 10 MG tablet, Take 10 mg by mouth daily. , Disp: , Rfl:  .  Cholecalciferol (VITAMIN D3) 2000 UNITS TABS, Take 2,000 Int'l Units by mouth daily. , Disp: , Rfl:  .  fluticasone (FLONASE) 50 MCG/ACT nasal  spray, USE TWO SPRAYS IN EACH NOSTRIL DAILY AS NEEDED., Disp: 48 g, Rfl: 1 .  ibuprofen (ADVIL,MOTRIN) 200 MG tablet, Take 400 mg by mouth at bedtime., Disp: , Rfl:  .  latanoprost (XALATAN) 0.005 % ophthalmic solution, Place 1 drop into both eyes at bedtime., Disp: , Rfl:  .  losartan (COZAAR) 25 MG tablet, Take 1 tablet (25 mg total) by mouth daily., Disp: 90 tablet, Rfl: 0 .  montelukast (SINGULAIR) 10 MG tablet, TAKE 1 TABLET BY MOUTH EVERY DAY, Disp: 90 tablet, Rfl: 1 .  pravastatin (PRAVACHOL) 20 MG tablet, Take 1 tablet (20 mg total) by mouth 3 (three) times a week., Disp: 12 tablet, Rfl: 1   Allergies  Allergen Reactions    . Lipitor [Atorvastatin] Other (See Comments)    Leg Cramps  . Lisinopril Cough   Review of Systems   Pertinent items are noted in the HPI. Otherwise, a complete ROS is negative.  Vitals:   Vitals:   07/28/18 0832  BP: 136/68  Pulse: 69  Temp: 97.9 F (36.6 C)  TempSrc: Oral  SpO2: 98%  Weight: 251 lb 3.2 oz (113.9 kg)  Height: 5' 8.5" (1.74 m)     Body mass index is 37.64 kg/m.  Physical Exam:   Physical Exam  Constitutional: He appears well-developed and well-nourished. No distress.  HENT:  Head: Normocephalic and atraumatic.  Right Ear: External ear normal.  Left Ear: External ear normal.  Nose: Nose normal.  Mouth/Throat: Oropharynx is clear and moist.  Eyes: Pupils are equal, round, and reactive to light. Conjunctivae and EOM are normal.  Neck: Neck supple.  Cardiovascular: Normal rate, regular rhythm and intact distal pulses.  Pulmonary/Chest: Effort normal.  Abdominal: Soft. Bowel sounds are normal.  Musculoskeletal: Normal range of motion.  Neurological: He is alert.  Skin: Skin is warm.  Psychiatric: He has a normal mood and affect. His behavior is normal.  Nursing note and vitals reviewed.  Assessment and Plan:   Mychael was seen today for follow-up.  Diagnoses and all orders for this visit:  Dyslipidemia -     Discontinue: pravastatin (PRAVACHOL) 20 MG tablet; Take 1 tablet (20 mg total) by mouth 3 (three) times a week. -     ECHOCARDIOGRAM COMPLETE; Future  Mild intermittent asthma without complication -     Discontinue: albuterol (PROVENTIL HFA;VENTOLIN HFA) 108 (90 Base) MCG/ACT inhaler; Inhale 2 puffs into the lungs every 6 (six) hours as needed for wheezing or shortness of breath.  Seasonal allergic rhinitis due to pollen -     fluticasone (FLONASE) 50 MCG/ACT nasal spray; USE TWO SPRAYS IN EACH NOSTRIL DAILY AS NEEDED. -     montelukast (SINGULAIR) 10 MG tablet; Take 1 tablet (10 mg total) by mouth daily.  At risk for cardiovascular  event -     ECHOCARDIOGRAM COMPLETE; Future  B12 deficiency, with monthly injections -     cyanocobalamin ((VITAMIN B-12)) injection 1,000 mcg  Benign essential hypertension -     losartan (COZAAR) 25 MG tablet; Take 1 tablet (25 mg total) by mouth daily. -     ECHOCARDIOGRAM COMPLETE; Future  Hyperglycemia -     Comprehensive metabolic panel -     Hemoglobin A1c  PVC's (premature ventricular contractions) -     ECHOCARDIOGRAM COMPLETE; Future    . Orders and follow up as documented in [place], reviewed diet, exercise and weight control, cardiovascular risk and specific lipid/LDL goals reviewed, reviewed medications and side effects in detail.  Marland Kitchen  Reviewed expectations re: course of current medical issues. . Outlined signs and symptoms indicating need for more acute intervention. . Patient verbalized understanding and all questions were answered. . Patient received an After Visit Summary.  CMA served as Education administrator during this visit. History, Physical, and Plan performed by medical provider. The above documentation has been reviewed and is accurate and complete. Briscoe Deutscher, D.O.  Briscoe Deutscher, DO [place], [facility] 08/08/2018

## 2018-07-28 NOTE — Patient Instructions (Signed)
When you are ready, okay to take oral B12 1000 mcg daily instead of injections.

## 2018-07-29 ENCOUNTER — Other Ambulatory Visit: Payer: Self-pay

## 2018-07-30 ENCOUNTER — Telehealth: Payer: Self-pay | Admitting: Family Medicine

## 2018-07-30 ENCOUNTER — Other Ambulatory Visit: Payer: Self-pay

## 2018-07-30 MED ORDER — ALBUTEROL SULFATE (2.5 MG/3ML) 0.083% IN NEBU: 2.5000 mg | INHALATION_SOLUTION | Freq: Four times a day (QID) | RESPIRATORY_TRACT | 12 refills | Status: DC | PRN

## 2018-07-30 NOTE — Telephone Encounter (Signed)
Copied from [place] [id number]. Topic: Quick Communication - Rx Refill/Question >> Jul 30, 2018 10:18 AM Reyne Dumas L wrote: Medication:  albuterol (PROVENTIL HFA;VENTOLIN HFA) 108 (90 Base) MCG/ACT inhaler  Gerald Stabs with [facility] states that pt's insurance mandates that he have [place] needs to make sure substitution is OK. Gerald Stabs can be reached at [phone].

## 2018-07-30 NOTE — Telephone Encounter (Signed)
Called in new script

## 2018-07-30 NOTE — Telephone Encounter (Signed)
See note

## 2018-08-03 ENCOUNTER — Other Ambulatory Visit: Payer: Self-pay | Admitting: Family Medicine

## 2018-08-03 DIAGNOSIS — E785 Hyperlipidemia, unspecified: Secondary | ICD-10-CM

## 2018-08-12 ENCOUNTER — Telehealth: Payer: Self-pay

## 2018-08-12 NOTE — Telephone Encounter (Signed)
Fax from pharmacy Losartan 50 mg on back order. Patient Is on 1/2 tab daily. Pharmacy requesting change in dose or medication. I have called pharmacy they have the 25mg  it has been changed to that

## 2018-08-31 ENCOUNTER — Encounter: Payer: Self-pay | Admitting: *Deleted

## 2018-08-31 ENCOUNTER — Ambulatory Visit (INDEPENDENT_AMBULATORY_CARE_PROVIDER_SITE_OTHER): Payer: Medicare Other | Admitting: *Deleted

## 2018-08-31 DIAGNOSIS — E538 Deficiency of other specified B group vitamins: Secondary | ICD-10-CM | POA: Diagnosis not present

## 2018-08-31 MED ORDER — CYANOCOBALAMIN 1000 MCG/ML IJ SOLN
1000.0000 ug | Freq: Once | INTRAMUSCULAR | Status: AC
  Administered 2018-08-31: 1000 ug via INTRAMUSCULAR

## 2018-08-31 NOTE — Progress Notes (Signed)
Per orders of Dr. Juleen China, injection of Cyanocobalamin 1000 mcg given Left Deltoid by Anselmo Pickler, LPN Patient tolerated injection well. Pt knows to return in one month for next injection.

## 2018-10-18 ENCOUNTER — Other Ambulatory Visit: Payer: Self-pay | Admitting: Family Medicine

## 2018-10-18 DIAGNOSIS — J301 Allergic rhinitis due to pollen: Secondary | ICD-10-CM

## 2018-10-18 NOTE — Telephone Encounter (Signed)
Copied from [place] [id number]. Topic: Quick Communication - Rx Refill/Question >> Oct 18, 2018  1:01 PM Colvin Caroli N wrote: Medication: montelukast (SINGULAIR) 10 MG tablet   Patient is requesting refill of this medication   Preferred Pharmacy (with phone number or street name): [facility] [place] [place] [facility] [phone] (Phone) [phone] (Fax)    Agent: Please be advised that RX refills may take up to 3 business days. We ask that you follow-up with your pharmacy.

## 2018-10-19 MED ORDER — MONTELUKAST SODIUM 10 MG PO TABS: 10.0000 mg | ORAL_TABLET | Freq: Every day | ORAL | 1 refills | Status: DC

## 2018-10-20 ENCOUNTER — Ambulatory Visit: Payer: Self-pay | Admitting: *Deleted

## 2018-10-20 NOTE — Telephone Encounter (Signed)
Called patient reviewed all information if any new or worsening symptoms will call to be seen sooner.

## 2018-10-20 NOTE — Telephone Encounter (Signed)
See note

## 2018-10-20 NOTE — Telephone Encounter (Signed)
Pt has app Friday with wife ok to hold off that long?

## 2018-10-20 NOTE — Telephone Encounter (Signed)
Call patients to get more information.

## 2018-10-20 NOTE — Telephone Encounter (Signed)
Pt and his wife called because they went on a [place] for 32 days; the pt says that he and his wife have not been out of the house since they arrived from cruise on 10/16/2018; the pt says that a lot of people were "hacking and coughing" on the cruise but no one on the ship was quarantined due to the corona virus'  he says that he started feeling sick on the cruise ship with a tickle and a cough which has progressed to productive with clear to brown sputum; he also says that today  he feels like a brick is on his when he had chest congestion, but he feels better since he coughed up the pheglm; he has not checked his temperature; he had leg cramps, but no generalized body aches; he occasionally wheezes at night and has to stop his CPAP; he is short of breath with exertion at baseline; the pt says that he feels better today than he did 3 days ago; the pt says that he used fluticasone nasal spray for allergies, and that helped; the pt took 1 tylenol cough tablet twice and is using hall cough drops; recommendations made per nurse triage protocol to include seeing a physician within 24 hours; the pt would like to be seen by Dr Briscoe Deutscher only; explained that this provider has no availability until 10/22/2018; offered to make appointment with another provider but he declined; pt offered and accepted appointment with Dr Briscoe Deutscher, [place], 10/22/2018 at 1020; he verbalized understanding; will route to office for notification; also spoke with [place].    Reason for Disposition . Coughing up rusty-colored (reddish-brown) sputum  Answer Assessment - Initial Assessment Questions 1. ONSET: "When did the cough begin?"      10/13/2018 while on [employer] 2. SEVERITY: "How bad is the cough today?"      moderate 3. RESPIRATORY DISTRESS: "Describe your breathing."      Feels like brick is on his chest; can not take a good deep breath 4. FEVER: "Do  you have a fever?" If so, ask: "What is your temperature, how was it measured, and when did it start?"     Unable to assess; no thermometer 5. SPUTUM: "Describe the color of your sputum" (clear, white, yellow, green)     Clear to brown 6. HEMOPTYSIS: "Are you coughing up any blood?" If so ask: "How much?" (flecks, streaks, tablespoons, etc.)     no 7. CARDIAC HISTORY: "Do you have any history of heart disease?" (e.g., heart attack, congestive heart failure)      High blood  8. LUNG HISTORY: "Do you have any history of lung disease?"  (e.g., pulmonary embolus, asthma, emphysema)     Asthma (uses inhaler 2-3 times per year) 9. PE RISK FACTORS: "Do you have a history of blood clots?" (or: recent major surgery, recent prolonged travel, bedridden)     3 hour flight and 5 hour flight (total 8 hours with 2 hours between flights) 10. OTHER SYMPTOMS: "Do you have any other symptoms?" (e.g., runny nose, wheezing, chest pain)       Wheezing, runny nose (normal due to allergies) 11. PREGNANCY: "Is there any chance you are pregnant?" "When was your last menstrual period?"       n/a 12. TRAVEL: "Have you traveled out of the country in the last month?" (e.g., travel history, exposures)       [place]arrived [place] 10[place]16/98[place]  Protocols used: [place]

## 2018-10-21 NOTE — Progress Notes (Signed)
Eric Stokes is a 73 y.o. male here for an acute visit.  History of Present Illness:   Eric Stokes, CMA acting as scribe for Dr. Briscoe Deutscher.    HPI: Eric Stokes on a [place] ([place], [place], [place], and [place]) for 32 days; the pt says that he and his wife have not been out of the house since they arrived from cruise on 10/16/2018; the pt says that a lot of people were "hacking and coughing" on the cruise but no one on the ship was quarantined due to the corona virus'  he says that he started feeling sick on the cruise ship with a tickle and a cough which has progressed to productive with clear to brown sputum; he also says that today  he feels like a brick is on his when he had chest congestion, but he feels better since he coughed up the pheglm; he has not checked his temperature; he had leg cramps, but no generalized body aches; he occasionally wheezes at night and has to stop his CPAP; he is short of breath with exertion at baseline; the pt says that he feels better today than he did 3 days ago; the pt says that he used fluticasone nasal spray for allergies, and that helped; the pt took 1 tylenol cough tablet twice and is using hall cough drops.  PMHx, SurgHx, SocialHx, Medications, and Allergies were reviewed in the Visit Navigator and updated as appropriate.  Current Medications   .  albuterol (PROVENTIL) (2.5 MG/3ML) 0.083% nebulizer solution, Take 3 mLs (2.5 mg total) by nebulization every 6 (six) hours as needed for wheezing or shortness of breath., Disp: 75 mL, Rfl: 12 .  amLODipine (NORVASC) 5 MG tablet, Take 1 tablet (5 mg total) by mouth daily., Disp: 90 tablet, Rfl: 1 .  aspirin 81 MG EC tablet, Take 81 mg by mouth daily. , Disp: , Rfl:  .  cetirizine (ZYRTEC) 10 MG tablet, Take 10 mg by mouth daily. , Disp: , Rfl:  .  Cholecalciferol (VITAMIN D3) 2000 UNITS TABS, Take 2,000 Int'l Units by mouth daily. , Disp: , Rfl:  .  fluticasone (FLONASE) 50 MCG/ACT nasal  spray, USE TWO SPRAYS IN EACH NOSTRIL DAILY AS NEEDED., Disp: 48 g, Rfl: 1 .  ibuprofen (ADVIL,MOTRIN) 200 MG tablet, Take 400 mg by mouth at bedtime., Disp: , Rfl:  .  latanoprost (XALATAN) 0.005 % ophthalmic solution, Place 1 drop into both eyes at bedtime., Disp: , Rfl:  .  losartan (COZAAR) 25 MG tablet, Take 1 tablet (25 mg total) by mouth daily., Disp: 90 tablet, Rfl: 0 .  montelukast (SINGULAIR) 10 MG tablet, Take 1 tablet (10 mg total) by mouth daily., Disp: 90 tablet, Rfl: 1 .  pravastatin (PRAVACHOL) 20 MG tablet, TAKE 1 TABLET (20 MG TOTAL) BY MOUTH 3 (THREE) TIMES A WEEK., Disp: 36 tablet, Rfl: 1   Allergies  Allergen Reactions  . Lipitor [Atorvastatin] Other (See Comments)    Leg Cramps  . Lisinopril Cough   Review of Systems   Pertinent items are noted in the HPI. Otherwise, ROS is negative.  Vitals   Vitals:   10/22/18 1023  BP: (!) 142/76  Pulse: 98  Temp: 98.6 F (37 C)  TempSrc: Oral  SpO2: 96%     There is no height or weight on file to calculate BMI.  Physical Exam   Physical Exam Vitals signs and nursing note reviewed.  Constitutional:      General: He  is not in acute distress.    Appearance: He is well-developed.  HENT:     Head: Normocephalic and atraumatic.     Right Ear: Tympanic membrane and external ear normal.     Left Ear: Tympanic membrane and external ear normal.     Nose: Congestion and rhinorrhea present.     Mouth/Throat:     Mouth: Mucous membranes are moist.  Eyes:     Conjunctiva/sclera: Conjunctivae normal.     Pupils: Pupils are equal, round, and reactive to light.  Neck:     Musculoskeletal: Neck supple.  Cardiovascular:     Rate and Rhythm: Normal rate and regular rhythm.  Pulmonary:     Effort: Pulmonary effort is normal. No respiratory distress.     Breath sounds: No wheezing, rhonchi or rales.  Abdominal:     General: Bowel sounds are normal.     Palpations: Abdomen is soft.  Musculoskeletal: Normal range of motion.    Skin:    General: Skin is warm.  Neurological:     Mental Status: He is alert.  Psychiatric:        Behavior: Behavior normal.    Assessment and Plan   Eric Stokes was seen today for cough.  Diagnoses and all orders for this visit:  Mild intermittent extrinsic asthma with acute exacerbation -     fluticasone (FLOVENT DISKUS) 50 MCG/BLIST diskus inhaler; Inhale 1 puff into the lungs 2 (two) times daily. -     doxycycline (VIBRA-TABS) 100 MG tablet; Take 1 tablet (100 mg total) by mouth 2 (two) times daily.  OSA on CPAP  Former smoker, stopped smoking in distant past  Viral illness    . Reviewed expectations re: course of current medical issues. . Discussed self-management of symptoms. . Outlined signs and symptoms indicating need for more acute intervention. . Patient verbalized understanding and all questions were answered. Marland Kitchen Health Maintenance issues including appropriate healthy diet, exercise, and smoking avoidance were discussed with patient. . See orders for this visit as documented in the electronic medical record. . Patient received an After Visit Summary.  CMA served as Education administrator during this visit. History, Physical, and Plan performed by medical provider. The above documentation has been reviewed and is accurate and complete. Briscoe Deutscher, D.O.  Briscoe Deutscher, DO [place], [facility] 10/22/2018

## 2018-10-22 ENCOUNTER — Ambulatory Visit (INDEPENDENT_AMBULATORY_CARE_PROVIDER_SITE_OTHER): Payer: Medicare Other | Admitting: Family Medicine

## 2018-10-22 ENCOUNTER — Encounter: Payer: Self-pay | Admitting: Family Medicine

## 2018-10-22 VITALS — BP 142/76 | HR 98 | Temp 98.6°F

## 2018-10-22 DIAGNOSIS — B349 Viral infection, unspecified: Secondary | ICD-10-CM

## 2018-10-22 DIAGNOSIS — Z87891 Personal history of nicotine dependence: Secondary | ICD-10-CM

## 2018-10-22 DIAGNOSIS — G4733 Obstructive sleep apnea (adult) (pediatric): Secondary | ICD-10-CM

## 2018-10-22 DIAGNOSIS — J4521 Mild intermittent asthma with (acute) exacerbation: Secondary | ICD-10-CM

## 2018-10-22 DIAGNOSIS — Z9989 Dependence on other enabling machines and devices: Secondary | ICD-10-CM

## 2018-10-22 MED ORDER — DOXYCYCLINE HYCLATE 100 MG PO TABS: 100.0000 mg | ORAL_TABLET | Freq: Two times a day (BID) | ORAL | 0 refills | Status: DC

## 2018-10-22 MED ORDER — FLUTICASONE PROPIONATE (INHAL) 50 MCG/BLIST IN AEPB: 1.0000 | INHALATION_SPRAY | Freq: Two times a day (BID) | RESPIRATORY_TRACT | 12 refills | Status: DC

## 2018-10-26 ENCOUNTER — Ambulatory Visit: Payer: Medicare Other | Admitting: Family Medicine

## 2018-10-26 ENCOUNTER — Other Ambulatory Visit (HOSPITAL_COMMUNITY): Payer: Medicare Other

## 2018-10-28 ENCOUNTER — Other Ambulatory Visit: Payer: Self-pay | Admitting: Family Medicine

## 2018-10-29 ENCOUNTER — Telehealth: Payer: Self-pay

## 2018-10-29 NOTE — Telephone Encounter (Signed)
In an effort to try to help prevent the spread of Covid-19:  Called pt to see if he would be willing to schedule his follow-up appointment. He reports that he was going to contact the office to cancel because he is feeling fine and doesn't want to risk being around "all those sick people". He will contact the office to reschedule in a couple of weeks when "everything calms down".   Visit canceled. Forwarding to [facility] as Juluis Rainier.

## 2018-11-01 ENCOUNTER — Ambulatory Visit: Payer: Medicare Other | Admitting: Family Medicine

## 2018-11-03 ENCOUNTER — Other Ambulatory Visit (HOSPITAL_COMMUNITY): Payer: Medicare Other

## 2019-02-10 ENCOUNTER — Other Ambulatory Visit: Payer: Self-pay | Admitting: Family Medicine

## 2019-02-10 DIAGNOSIS — I10 Essential (primary) hypertension: Secondary | ICD-10-CM

## 2019-02-21 DIAGNOSIS — R350 Frequency of micturition: Secondary | ICD-10-CM | POA: Diagnosis not present

## 2019-02-24 DIAGNOSIS — R3914 Feeling of incomplete bladder emptying: Secondary | ICD-10-CM | POA: Diagnosis not present

## 2019-02-28 ENCOUNTER — Encounter (HOSPITAL_COMMUNITY): Payer: Self-pay | Admitting: Emergency Medicine

## 2019-02-28 ENCOUNTER — Observation Stay (HOSPITAL_COMMUNITY)
Admission: EM | Admit: 2019-02-28 | Discharge: 2019-03-01 | Disposition: A | Discharge status: Home / Self Care | Payer: Medicare Other | Attending: Family Medicine | Admitting: Family Medicine

## 2019-02-28 ENCOUNTER — Observation Stay (HOSPITAL_COMMUNITY): Payer: Medicare Other

## 2019-02-28 ENCOUNTER — Other Ambulatory Visit: Payer: Self-pay

## 2019-02-28 ENCOUNTER — Emergency Department (HOSPITAL_COMMUNITY): Payer: Medicare Other

## 2019-02-28 DIAGNOSIS — N138 Other obstructive and reflux uropathy: Secondary | ICD-10-CM | POA: Diagnosis not present

## 2019-02-28 DIAGNOSIS — R51 Headache: Secondary | ICD-10-CM | POA: Diagnosis not present

## 2019-02-28 DIAGNOSIS — E785 Hyperlipidemia, unspecified: Secondary | ICD-10-CM | POA: Diagnosis present

## 2019-02-28 DIAGNOSIS — N390 Urinary tract infection, site not specified: Secondary | ICD-10-CM | POA: Insufficient documentation

## 2019-02-28 DIAGNOSIS — Z03818 Encounter for observation for suspected exposure to other biological agents ruled out: Secondary | ICD-10-CM | POA: Diagnosis not present

## 2019-02-28 DIAGNOSIS — N401 Enlarged prostate with lower urinary tract symptoms: Secondary | ICD-10-CM | POA: Diagnosis present

## 2019-02-28 DIAGNOSIS — R410 Disorientation, unspecified: Secondary | ICD-10-CM

## 2019-02-28 DIAGNOSIS — Z79899 Other long term (current) drug therapy: Secondary | ICD-10-CM | POA: Insufficient documentation

## 2019-02-28 DIAGNOSIS — N400 Enlarged prostate without lower urinary tract symptoms: Secondary | ICD-10-CM | POA: Insufficient documentation

## 2019-02-28 DIAGNOSIS — Z87891 Personal history of nicotine dependence: Secondary | ICD-10-CM | POA: Diagnosis not present

## 2019-02-28 DIAGNOSIS — I10 Essential (primary) hypertension: Secondary | ICD-10-CM | POA: Diagnosis not present

## 2019-02-28 DIAGNOSIS — G43909 Migraine, unspecified, not intractable, without status migrainosus: Secondary | ICD-10-CM | POA: Insufficient documentation

## 2019-02-28 DIAGNOSIS — Z8673 Personal history of transient ischemic attack (TIA), and cerebral infarction without residual deficits: Secondary | ICD-10-CM

## 2019-02-28 DIAGNOSIS — G4733 Obstructive sleep apnea (adult) (pediatric): Secondary | ICD-10-CM

## 2019-02-28 DIAGNOSIS — Z7982 Long term (current) use of aspirin: Secondary | ICD-10-CM | POA: Diagnosis not present

## 2019-02-28 DIAGNOSIS — I6523 Occlusion and stenosis of bilateral carotid arteries: Secondary | ICD-10-CM | POA: Diagnosis not present

## 2019-02-28 DIAGNOSIS — G459 Transient cerebral ischemic attack, unspecified: Principal | ICD-10-CM | POA: Diagnosis present

## 2019-02-28 DIAGNOSIS — G43109 Migraine with aura, not intractable, without status migrainosus: Secondary | ICD-10-CM

## 2019-02-28 DIAGNOSIS — R41841 Cognitive communication deficit: Secondary | ICD-10-CM | POA: Insufficient documentation

## 2019-02-28 DIAGNOSIS — R338 Other retention of urine: Secondary | ICD-10-CM | POA: Diagnosis not present

## 2019-02-28 DIAGNOSIS — J449 Chronic obstructive pulmonary disease, unspecified: Secondary | ICD-10-CM | POA: Insufficient documentation

## 2019-02-28 HISTORY — DX: Chronic obstructive pulmonary disease, unspecified: J44.9

## 2019-02-28 HISTORY — DX: Personal history of transient ischemic attack (TIA), and cerebral infarction without residual deficits: Z86.73

## 2019-02-28 LAB — CBC
HCT: 40.2 % (ref 39.0–52.0)
Hemoglobin: 13.9 g/dL (ref 13.0–17.0)
MCH: 30.5 pg (ref 26.0–34.0)
MCHC: 34.6 g/dL (ref 30.0–36.0)
MCV: 88.4 fL (ref 80.0–100.0)
Platelets: 224 10*3/uL (ref 150–400)
RBC: 4.55 MIL/uL (ref 4.22–5.81)
RDW: 13.2 % (ref 11.5–15.5)
WBC: 6 10*3/uL (ref 4.0–10.5)
nRBC: 0 % (ref 0.0–0.2)

## 2019-02-28 LAB — BASIC METABOLIC PANEL
Anion gap: 11 (ref 5–15)
BUN: 13 mg/dL (ref 8–23)
CO2: 22 mmol/L (ref 22–32)
Calcium: 9 mg/dL (ref 8.9–10.3)
Chloride: 103 mmol/L (ref 98–111)
Creatinine, Ser: 0.82 mg/dL (ref 0.61–1.24)
GFR calc Af Amer: >60 mL/min (ref >60)
GFR calc non Af Amer: >60 mL/min (ref >60)
Glucose, Bld: 104 mg/dL — ABNORMAL HIGH (ref 70–99)
Potassium: 3.9 mmol/L (ref 3.5–5.1)
Sodium: 136 mmol/L (ref 135–145)

## 2019-02-28 LAB — CBG MONITORING, ED: Glucose-Capillary: 91 mg/dL (ref 70–99)

## 2019-02-28 LAB — URINALYSIS, ROUTINE W REFLEX MICROSCOPIC
Bilirubin Urine: NEGATIVE
Glucose, UA: NEGATIVE mg/dL
Ketones, ur: NEGATIVE mg/dL
Nitrite: NEGATIVE
Protein, ur: NEGATIVE mg/dL
Specific Gravity, Urine: 1.008 (ref 1.005–1.030)
pH: 5 (ref 5.0–8.0)

## 2019-02-28 LAB — TROPONIN I (HIGH SENSITIVITY)
Troponin I (High Sensitivity): 6 ng/L (ref <18)
Troponin I (High Sensitivity): 6 ng/L (ref <18)

## 2019-02-28 LAB — SARS CORONAVIRUS 2 BY RT PCR (HOSPITAL ORDER, PERFORMED IN ~~LOC~~ HOSPITAL LAB): SARS Coronavirus 2: NEGATIVE

## 2019-02-28 MED ORDER — IOHEXOL 350 MG/ML SOLN
75.0000 mL | Freq: Once | INTRAVENOUS | Status: AC | PRN
  Administered 2019-02-28: 75 mL via INTRAVENOUS

## 2019-02-28 MED ORDER — ACETAMINOPHEN 650 MG RE SUPP: 650.0000 mg | RECTAL | Status: DC | PRN

## 2019-02-28 MED ORDER — MONTELUKAST SODIUM 10 MG PO TABS
10.0000 mg | ORAL_TABLET | Freq: Every day | ORAL | Status: DC
  Administered 2019-02-28: 10 mg via ORAL
  Filled 2019-02-28 (×2): qty 1

## 2019-02-28 MED ORDER — LORATADINE 10 MG PO TABS
10.0000 mg | ORAL_TABLET | Freq: Every day | ORAL | Status: DC
  Filled 2019-02-28 (×2): qty 1

## 2019-02-28 MED ORDER — TAMSULOSIN HCL 0.4 MG PO CAPS
0.4000 mg | ORAL_CAPSULE | Freq: Every day | ORAL | Status: DC
  Administered 2019-02-28: 0.4 mg via ORAL
  Filled 2019-02-28 (×2): qty 1

## 2019-02-28 MED ORDER — ASPIRIN 81 MG PO TBEC
81.0000 mg | DELAYED_RELEASE_TABLET | Freq: Every day | ORAL | Status: DC
  Filled 2019-02-28 (×2): qty 1

## 2019-02-28 MED ORDER — ASPIRIN EC 81 MG PO TBEC
81.0000 mg | DELAYED_RELEASE_TABLET | Freq: Every day | ORAL | Status: DC
  Administered 2019-02-28: 81 mg via ORAL
  Filled 2019-02-28 (×2): qty 1

## 2019-02-28 MED ORDER — FLUTICASONE PROPIONATE (INHAL) 50 MCG/BLIST IN AEPB: 1.0000 | INHALATION_SPRAY | Freq: Two times a day (BID) | RESPIRATORY_TRACT | Status: DC

## 2019-02-28 MED ORDER — PRAVASTATIN SODIUM 10 MG PO TABS
20.0000 mg | ORAL_TABLET | ORAL | Status: DC
  Administered 2019-02-28: 20 mg via ORAL
  Filled 2019-02-28: qty 2

## 2019-02-28 MED ORDER — ACETAMINOPHEN 325 MG PO TABS: 650.0000 mg | ORAL_TABLET | ORAL | Status: DC | PRN

## 2019-02-28 MED ORDER — FLUTICASONE PROPIONATE 50 MCG/ACT NA SUSP
2.0000 | Freq: Every day | NASAL | Status: DC
  Filled 2019-02-28: qty 16

## 2019-02-28 MED ORDER — ACETAMINOPHEN 160 MG/5ML PO SOLN: 650.0000 mg | ORAL | Status: DC | PRN

## 2019-02-28 MED ORDER — LATANOPROST 0.005 % OP SOLN
1.0000 [drp] | Freq: Every day | OPHTHALMIC | Status: DC
  Administered 2019-03-01: 1 [drp] via OPHTHALMIC
  Filled 2019-02-28: qty 2.5

## 2019-02-28 MED ORDER — STROKE: EARLY STAGES OF RECOVERY BOOK
Freq: Once | Status: AC
  Administered 2019-02-28: 21:00:00
  Filled 2019-02-28: qty 1

## 2019-02-28 MED ORDER — DOXYCYCLINE HYCLATE 100 MG PO TABS
100.0000 mg | ORAL_TABLET | Freq: Two times a day (BID) | ORAL | Status: DC
  Administered 2019-02-28 – 2019-03-01 (×2): 100 mg via ORAL
  Filled 2019-02-28 (×2): qty 1

## 2019-02-28 MED ORDER — SODIUM CHLORIDE 0.9% FLUSH: 3.0000 mL | Freq: Once | INTRAVENOUS | Status: DC

## 2019-02-28 MED ORDER — BUDESONIDE 0.25 MG/2ML IN SUSP
0.2500 mg | Freq: Two times a day (BID) | RESPIRATORY_TRACT | Status: DC
  Filled 2019-02-28: qty 2

## 2019-02-28 MED ORDER — SODIUM CHLORIDE 0.9 % IV BOLUS
500.0000 mL | Freq: Once | INTRAVENOUS | Status: AC
  Administered 2019-02-28: 500 mL via INTRAVENOUS

## 2019-02-28 NOTE — H&P (Signed)
History and Physical:    Eric Stokes   RSW:[id number] DOB: Jul 29, 1946 DOA: 02/28/2019  Referring MD/provider: Dr Francia Greaves PCP: Briscoe Deutscher, DO   Patient coming from: Home  Chief Complaint: Transient episode of slow thinking and confusion, now resolved  History of Present Illness:   Eric Stokes is an 73 y.o. male with past medical history significant for hypertension, previous tobacco use and OSA who was in his usual state of relatively good health until 11:00 today when he developed dizziness and confusion.  Patient states that he was aware that he was confused.  He knew he was in the garden but was not sure why he was out there and had to think really hard to remember that he was picking green beans.  He walked back inside and he was unable to remember his children and grandchildren's name.  He did know his wife.  He apparently was speaking very slowly although he did not appear to have dysarthria or a aphasia per his daughter who is a Designer, jewellery.  Patient was noted to be relatively more hypertensive during this episode and patient was brought to the ED.  On route to ED patient symptoms entirely resolved and he was no longer confused.  Past medical history is notable for a UTI with associated urinary retention requiring Foley placement.  Foley was removed earlier today at urology office.  Patient is continued on doxycycline for UTI.  ED Course:  The patient was noted to be somewhat hypertensive which resolved spontaneously.  Patient was seen by neurology who recommended admission for TIA work-up.  ROS:   ROS   Review of Systems: General: No fever, chills, weight changes Skin: No rashes, lesions, wounds Endocrine: no heat/cold intolerance, no polyuria Respiratory: Denies cough,, shortness of breath, hemoptysis Cardiovascular: No palpitations, chest pain GI: No nausea, vomiting, diarrhea, constipation CNS: No numbness, dizziness, headache Musculoskeletal:  No back pain, joint pain Blood/lymphatics: No easy bruising, bleeding Mood/affect: No anxiety/depression    Past Medical History:   Past Medical History:  Diagnosis Date  . Abnormal EKG   . Benign essential hypertension, on Norvasc and Amlodipine 12/21/2008   Followed by [facility]   . BPH (benign prostatic hyperplasia) 09/03/2012  . Dyslipidemia 12/21/2008   Qualifier: Diagnosis of  By: Percival Spanish, MD, Farrel Gordon    . Elevated PSA 09/03/2012  . Essential hypertension, benign 12/21/2008   Qualifier: Diagnosis of  By: Percival Spanish, MD, Farrel Gordon    . Former smoker, stopped smoking in distant past 03/15/2018  . Hematuria 09/16/2011  . Hx of colonic polyps 09/03/2012   Last colonoscopy March 2012 by Dr. Collene Mares   . Obesity, unspecified 12/21/2008   Qualifier: Diagnosis of  By: Percival Spanish, MD, Farrel Gordon    . OSA on CPAP 09/25/2015   Diagnosed at the [facility] 2016   . Vitamin D deficiency 11/26/2010    Past Surgical History:   Past Surgical History:  Procedure Laterality Date  . CHOLECYSTECTOMY  2001  . TONSILLECTOMY AND ADENOIDECTOMY  1952    Social History:   Social History   Socioeconomic History  . Marital status: Married    Spouse name: Not on file  . Number of children: Not on file  . Years of education: Not on file  . Highest education level: Not on file  Occupational History  . Not on file  Social Needs  . Financial resource strain: Not on file  . Food insecurity    Worry: Not  on file    Inability: Not on file  . Transportation needs    Medical: Not on file    Non-medical: Not on file  Tobacco Use  . Smoking status: Former Smoker    Types: Cigarettes    Quit date: 08/18/1965    Years since quitting: 53.5  . Smokeless tobacco: Never Used  Substance and Sexual Activity  . Alcohol use: Yes    Alcohol/week: 2.0 - 6.0 standard drinks    Types: 2 - 6 Cans of beer per week  . Drug use: No  . Sexual activity: Not on file  Lifestyle  . Physical activity     Days per week: Not on file    Minutes per session: Not on file  . Stress: Not on file  Relationships  . Social Herbalist on phone: Not on file    Gets together: Not on file    Attends religious service: Not on file    Active member of club or organization: Not on file    Attends meetings of clubs or organizations: Not on file    Relationship status: Not on file  . Intimate partner violence    Fear of current or ex partner: Not on file    Emotionally abused: Not on file    Physically abused: Not on file    Forced sexual activity: Not on file  Other Topics Concern  . Not on file  Social History Narrative  . Not on file    Allergies   Lipitor [atorvastatin] and Lisinopril  Family history:   Family History  Problem Relation Age of Onset  . Diabetes Mother   . COPD Father   . Stroke Paternal Grandfather     Current Medications:   Prior to Admission medications   Medication Sig Start Date End Date Taking? Authorizing Provider  aspirin 81 MG EC tablet Take 81 mg by mouth daily.    Yes [provider]  cetirizine (ZYRTEC) 10 MG tablet Take 10 mg by mouth daily.    Yes [provider]  Cholecalciferol (VITAMIN D3) 2000 UNITS TABS Take 2,000 Int'l Units by mouth daily.    Yes [provider]  doxycycline (VIBRA-TABS) 100 MG tablet Take 1 tablet (100 mg total) by mouth 2 (two) times daily. 10/22/18  Yes Briscoe Deutscher, DO  fluticasone (FLONASE) 50 MCG/ACT nasal spray USE TWO SPRAYS IN EACH NOSTRIL DAILY AS NEEDED. Patient taking differently: Place 2 sprays into both nostrils daily.  07/28/18  Yes Briscoe Deutscher, DO  fluticasone (FLOVENT DISKUS) 50 MCG/BLIST diskus inhaler Inhale 1 puff into the lungs 2 (two) times daily. 10/22/18  Yes Briscoe Deutscher, DO  ibuprofen (ADVIL,MOTRIN) 200 MG tablet Take 400 mg by mouth at bedtime.   Yes [provider]  latanoprost (XALATAN) 0.005 % ophthalmic solution INSTILL 1 DROP INTO BOTH EYES AT  BEDTIME Patient taking differently: Place 1 drop into both eyes daily.  10/28/18  Yes Briscoe Deutscher, DO  losartan (COZAAR) 25 MG tablet TAKE 1 TABLET BY MOUTH EVERY DAY Patient taking differently: Take 25 mg by mouth at bedtime.  02/10/19  Yes Briscoe Deutscher, DO  montelukast (SINGULAIR) 10 MG tablet Take 1 tablet (10 mg total) by mouth daily. 10/19/18  Yes Briscoe Deutscher, DO  pravastatin (PRAVACHOL) 20 MG tablet TAKE 1 TABLET (20 MG TOTAL) BY MOUTH 3 (THREE) TIMES A WEEK. 08/04/18  Yes Briscoe Deutscher, DO  tamsulosin (FLOMAX) 0.4 MG CAPS capsule Take 0.4 mg by mouth daily.  02/24/19  Yes [provider]  albuterol (PROVENTIL) (2.5 MG/3ML) 0.083% nebulizer solution Take 3 mLs (2.5 mg total) by nebulization every 6 (six) hours as needed for wheezing or shortness of breath. Patient not taking: Reported on 02/28/2019 07/30/18   Briscoe Deutscher, DO  amLODipine (NORVASC) 5 MG tablet Take 1 tablet (5 mg total) by mouth daily. Patient not taking: Reported on 02/28/2019 03/15/18   Briscoe Deutscher, DO    Physical Exam:   Vitals:   02/28/19 1337 02/28/19 1523 02/28/19 1530 02/28/19 1600  BP: (!) 172/92 (!) 160/76 (!) 141/72 130/73  Pulse: 66 (!) 58 (!) 57 (!) 52  Resp: 20 17 17 13   Temp: 97.8 F (36.6 C)     TempSrc: Oral     SpO2: 97% 99% 98% 99%     Physical Exam: Blood pressure 130/73, pulse (!) 52, temperature 97.8 F (36.6 C), temperature source Oral, resp. rate 13, SpO2 99 %. Gen: Friendly obese gentleman lying in bed at 20 degrees in no acute respiratory distress with his daughter at bedside. Eyes: Sclerae anicteric. Conjunctiva mildly injected. Chest: Moderately good air entry bilaterally with no adventitious sounds.  CV: Distant, regular, no audible murmurs. Abdomen: NABS, soft, nondistended, nontender. No tenderness to light or deep palpation. No rebound, no guarding. Extremities: No edema.  Skin: Warm and dry. No rashes, lesions or wounds. Neuro: Alert and oriented times 3; grossly  nonfocal. Psych: Patient is cooperative, logical and coherent with appropriate mood and affect.  Data Review:    Labs: Basic Metabolic Panel: Recent Labs  Lab 02/28/19 1353  NA 136  K 3.9  CL 103  CO2 22  GLUCOSE 104*  BUN 13  CREATININE 0.82  CALCIUM 9.0   Liver Function Tests: No results for input(s): AST, ALT, ALKPHOS, BILITOT, PROT, ALBUMIN in the last 168 hours. No results for input(s): LIPASE, AMYLASE in the last 168 hours. No results for input(s): AMMONIA in the last 168 hours. CBC: Recent Labs  Lab 02/28/19 1353  WBC 6.0  HGB 13.9  HCT 40.2  MCV 88.4  PLT 224   Cardiac Enzymes: No results for input(s): CKTOTAL, CKMB, CKMBINDEX, TROPONINI in the last 168 hours.  BNP (last 3 results) No results for input(s): PROBNP in the last 8760 hours. CBG: Recent Labs  Lab 02/28/19 1516  GLUCAP 91    Urinalysis    Component Value Date/Time   COLORURINE YELLOW 02/28/2019 1527   APPEARANCEUR HAZY (A) 02/28/2019 1527   LABSPEC 1.008 02/28/2019 1527   PHURINE 5.0 02/28/2019 1527   GLUCOSEU NEGATIVE 02/28/2019 1527   HGBUR MODERATE (A) 02/28/2019 1527   BILIRUBINUR NEGATIVE 02/28/2019 1527   BILIRUBINUR n 09/18/2015 0904   KETONESUR NEGATIVE 02/28/2019 1527   PROTEINUR NEGATIVE 02/28/2019 1527   UROBILINOGEN 0.2 09/18/2015 0904   NITRITE NEGATIVE 02/28/2019 1527   LEUKOCYTESUR MODERATE (A) 02/28/2019 1527      Radiographic Studies: Ct Head Wo Contrast  Result Date: 02/28/2019 CLINICAL DATA:  Headache, dizziness EXAM: CT HEAD WITHOUT CONTRAST TECHNIQUE: Contiguous axial images were obtained from the base of the skull through the vertex without intravenous contrast. COMPARISON:  None. FINDINGS: Brain: No evidence of acute infarction, hemorrhage, hydrocephalus, extra-axial collection or mass lesion/mass effect. Vascular: No hyperdense vessel or unexpected calcification. Skull: Normal. Negative for fracture or focal lesion. Sinuses/Orbits: No acute finding. Other:  None. IMPRESSION: No acute intracranial pathology. No non-contrast CT findings to explain headache or dizziness. Electronically Signed   By: Eddie Candle M.D.   On:  02/28/2019 16:47    EKG: Independently reviewed.  Sinus rhythm at 70.  Normal intervals.  Axis at -15.  Q in 3 and F.  No acute ST-T wave changes.   Assessment/Plan:   Principal Problem:   TIA (transient ischemic attack) Active Problems:   Dyslipidemia, on Pravastatin   Benign essential hypertension   BPH (benign prostatic hyperplasia)   OSA on CPAP   74 year old man getting treatment for UTI had a 1 hour episode of transient alteration in mentation.  He is now back to baseline.  TRANSIENT ALTERATION IN MENTATION It is unclear to me if patient was having a TIA versus hypertensive encephalopathy versus something else.  However will admit for TIA work-up per neurology recommendations.  TIA RULE OUT TIA work-up per protocol with CT angiogram, MRI, echocardiogram.   Echocardiogram will be interesting given no known history of MI but Q's in 3 and F.Neurology is continue to follow, they recommend permissive hypertension. Continue patient on aspirin and atorvastatin per home doses. Further antiplatelet management per neurology recommendations.  HTN Will hold losartan for now given neurology recommendation for permissive hypertension.Marland Kitchen  BPH Continue Flomax  UTI To new doxycycline  HL Continue atorvastatin.  COPD No evidence for acute flare, continue Flovent, montelukast and as needed albuterol MDI    Other information:   DVT prophylaxis: Lovenox ordered. Code Status: Full code. Family Communication: Patient's daughter who is a Designer, jewellery was at bedside throughout Disposition Plan: Home Consults called: Neurology Admission status: Observation   Eric Stokes  If 7PM-7AM, please contact night-coverage [email or web address] Password [facility] 02/28/2019, 5:59 PM

## 2019-02-28 NOTE — Progress Notes (Signed)
Eric Stokes is a 73 year old white male admitted from home to [facility] with  Difficulty finding words and confusion  Which has resolved since coming to the hospital. Pt alert awake and oriented x 4  MAE  Pupils  2 mm  PERRL. Pt denied  blurred vision  But slight headache  Rated 1/10  And does not needs any medication . .VS stable Cardiac monitor in use and verified SR to SB.  Pt oriented to [facility] of plan discussed with pt and pt demonstrated good understanding. and use of call light for assist , safety measures in place. Will e to monitor.

## 2019-02-28 NOTE — ED Provider Notes (Signed)
[facility] EMERGENCY DEPARTMENT Provider Note   CSN: [id number] Arrival date & time: 02/28/19  1327     History   Chief Complaint Chief Complaint  Patient presents with  . Near Syncope  . Headache  . Numbness    HPI Eric Stokes is a 73 y.o. male.     73 year old male with prior medical history as detailed below presents for evaluation of reported transient episode of headache and confusion.  Patient reports that earlier this morning he had a catheter removed.  This was secondary to recently diagnosed UTI with urinary retention.  After removal of his catheter, the patient returned home and then went into his garden to pick beans.  While picking beans he had acute onset of headache and generalized weakness.  He cannot recall the entire details.  Per his daughter he was very confused and could not recall her name.  His confusion and headache persisted for approximately an hour from noon to about 1:00.  Subsequently his symptoms improved.  The time of my evaluation is neurologically intact.  He is alert.  He reports that his headache is much improved.  He denies prior history of diagnosed TIA or stroke.  The history is provided by the patient and medical records.    Past Medical History:  Diagnosis Date  . Abnormal EKG   . Benign essential hypertension, on Norvasc and Amlodipine 12/21/2008   Followed by [facility]   . BPH (benign prostatic hyperplasia) 09/03/2012  . Dyslipidemia 12/21/2008   Qualifier: Diagnosis of  By: Percival Spanish, MD, Farrel Gordon    . Elevated PSA 09/03/2012  . Essential hypertension, benign 12/21/2008   Qualifier: Diagnosis of  By: Percival Spanish, MD, Farrel Gordon    . Former smoker, stopped smoking in distant past 03/15/2018  . Hematuria 09/16/2011  . Hx of colonic polyps 09/03/2012   Last colonoscopy March 2012 by Dr. Collene Mares   . Obesity, unspecified 12/21/2008   Qualifier: Diagnosis of  By: Percival Spanish, MD, Farrel Gordon    . OSA on CPAP 09/25/2015   Diagnosed at [facility] 2016   . Vitamin D deficiency 11/26/2010    Patient Active Problem List   Diagnosis Date Noted  . At risk for bradycardia 06/15/2018  . B12 deficiency, with monthly injections 06/15/2018  . Carotid bruit present 06/15/2018  . Primary osteoarthritis of right knee, followed by Orthopedics 04/12/2018  . Asthma, allergic, prn albuterol, triggered by perfumes 03/19/2018  . Former smoker, stopped smoking in distant past 03/15/2018  . OSA on CPAP 09/25/2015  . Elevated PSA 09/03/2012  . BPH (benign prostatic hyperplasia) 09/03/2012  . Hx of colonic polyps 09/03/2012  . Hematuria 09/16/2011  . Vitamin D deficiency 11/26/2010  . Allergic rhinitis, using Flonase 11/20/2010  . Hearing loss 11/20/2010  . Dyslipidemia, on Pravastatin 12/21/2008  . Benign essential hypertension 12/21/2008    Past Surgical History:  Procedure Laterality Date  . CHOLECYSTECTOMY  2001  . TONSILLECTOMY AND ADENOIDECTOMY  1952        Home Medications    Prior to Admission medications   Medication Sig Start Date End Date Taking? Authorizing Provider  albuterol (PROVENTIL) (2.5 MG/3ML) 0.083% nebulizer solution Take 3 mLs (2.5 mg total) by nebulization every 6 (six) hours as needed for wheezing or shortness of breath. 07/30/18   Briscoe Deutscher, DO  amLODipine (NORVASC) 5 MG tablet Take 1 tablet (5 mg total) by mouth daily. 03/15/18   Briscoe Deutscher, DO  aspirin 81 MG  EC tablet Take 81 mg by mouth daily.     [provider]  cetirizine (ZYRTEC) 10 MG tablet Take 10 mg by mouth daily.     [provider]  Cholecalciferol (VITAMIN D3) 2000 UNITS TABS Take 2,000 Int'l Units by mouth daily.     [provider]  doxycycline (VIBRA-TABS) 100 MG tablet Take 1 tablet (100 mg total) by mouth 2 (two) times daily. 10/22/18   Briscoe Deutscher, DO  fluticasone (FLONASE) 50 MCG/ACT nasal spray USE TWO SPRAYS IN EACH NOSTRIL DAILY AS NEEDED. 07/28/18   Briscoe Deutscher, DO   fluticasone (FLOVENT DISKUS) 50 MCG/BLIST diskus inhaler Inhale 1 puff into the lungs 2 (two) times daily. 10/22/18   Briscoe Deutscher, DO  ibuprofen (ADVIL,MOTRIN) 200 MG tablet Take 400 mg by mouth at bedtime.    [provider]  latanoprost (XALATAN) 0.005 % ophthalmic solution INSTILL 1 DROP INTO BOTH EYES AT BEDTIME 10/28/18   Briscoe Deutscher, DO  losartan (COZAAR) 25 MG tablet TAKE 1 TABLET BY MOUTH EVERY DAY 02/10/19   Briscoe Deutscher, DO  montelukast (SINGULAIR) 10 MG tablet Take 1 tablet (10 mg total) by mouth daily. 10/19/18   Briscoe Deutscher, DO  pravastatin (PRAVACHOL) 20 MG tablet TAKE 1 TABLET (20 MG TOTAL) BY MOUTH 3 (THREE) TIMES A WEEK. 08/04/18   Briscoe Deutscher, DO    Family History Family History  Problem Relation Age of Onset  . Diabetes Mother   . COPD Father   . Stroke Paternal Grandfather     Social History Social History   Tobacco Use  . Smoking status: Former Smoker    Types: Cigarettes    Quit date: 08/18/1965    Years since quitting: 53.5  . Smokeless tobacco: Never Used  Substance Use Topics  . Alcohol use: Yes    Alcohol/week: 2.0 - 6.0 standard drinks    Types: 2 - 6 Cans of beer per week  . Drug use: No     Allergies   Lipitor [atorvastatin] and Lisinopril   Review of Systems Review of Systems  Psychiatric/Behavioral: Positive for confusion.  All other systems reviewed and are negative.    Physical Exam Updated Vital Signs BP 130/73   Pulse (!) 52   Temp 97.8 F (36.6 C) (Oral)   Resp 13   SpO2 99%   Physical Exam Vitals signs and nursing note reviewed.  Constitutional:      General: He is not in acute distress.    Appearance: He is well-developed.  HENT:     Head: Normocephalic and atraumatic.  Eyes:     Conjunctiva/sclera: Conjunctivae normal.     Pupils: Pupils are equal, round, and reactive to light.  Neck:     Musculoskeletal: Normal range of motion and neck supple.  Cardiovascular:     Rate and Rhythm: Normal rate  and regular rhythm.     Heart sounds: Normal heart sounds.  Pulmonary:     Effort: Pulmonary effort is normal. No respiratory distress.     Breath sounds: Normal breath sounds.  Abdominal:     General: There is no distension.     Palpations: Abdomen is soft.     Tenderness: There is no abdominal tenderness.  Musculoskeletal: Normal range of motion.        General: No deformity.  Skin:    General: Skin is warm and dry.  Neurological:     Mental Status: He is alert and oriented to person, place, and time.  GCS: GCS eye subscore is 4. GCS verbal subscore is 5. GCS motor subscore is 6.     Cranial Nerves: No cranial nerve deficit or dysarthria.     Sensory: No sensory deficit.     Motor: No weakness.     Gait: Gait normal.      ED Treatments / Results  Labs (all labs ordered are listed, but only abnormal results are displayed) Labs Reviewed  BASIC METABOLIC PANEL - Abnormal; Notable for the following components:      Result Value   Glucose, Bld 104 (*)    All other components within normal limits  URINALYSIS, ROUTINE W REFLEX MICROSCOPIC - Abnormal; Notable for the following components:   APPearance HAZY (*)    Hgb urine dipstick MODERATE (*)    Leukocytes,Ua MODERATE (*)    Bacteria, UA RARE (*)    All other components within normal limits  SARS CORONAVIRUS 2 (HOSPITAL ORDER, [facility])  CBC  CBG MONITORING, ED  TROPONIN I (HIGH SENSITIVITY)  TROPONIN I (HIGH SENSITIVITY)    EKG EKG Interpretation  Date/Time:  Monday February 28 2019 13:44:13 EDT Ventricular Rate:  67 PR Interval:  188 QRS Duration: 96 QT Interval:  414 QTC Calculation: 437 R Axis:   -18 Text Interpretation:  Normal sinus rhythm with sinus arrhythmia Inferior infarct , age undetermined Abnormal ECG Confirmed by Dene Gentry [phone]) on 02/28/2019 2:53:26 PM   Radiology Ct Head Wo Contrast  Result Date: 02/28/2019 CLINICAL DATA:  Headache, dizziness EXAM: CT HEAD  WITHOUT CONTRAST TECHNIQUE: Contiguous axial images were obtained from the base of the skull through the vertex without intravenous contrast. COMPARISON:  None. FINDINGS: Brain: No evidence of acute infarction, hemorrhage, hydrocephalus, extra-axial collection or mass lesion/mass effect. Vascular: No hyperdense vessel or unexpected calcification. Skull: Normal. Negative for fracture or focal lesion. Sinuses/Orbits: No acute finding. Other: None. IMPRESSION: No acute intracranial pathology. No non-contrast CT findings to explain headache or dizziness. Electronically Signed   By: Eddie Candle M.D.   On: 02/28/2019 16:47    Procedures Procedures (including critical care time)  Medications Ordered in ED Medications  sodium chloride flush (NS) 0.9 % injection 3 mL (has no administration in time range)  sodium chloride 0.9 % bolus 500 mL (has no administration in time range)     Initial Impression / Assessment and Plan / ED Course  I have reviewed the triage vital signs and the nursing notes.  Pertinent labs & imaging results that were available during my care of the patient were reviewed by me and considered in my medical decision making (see chart for details).        MDM  Screen complete  Eric Stokes was evaluated in Emergency Department on 02/28/2019 for the symptoms described in the history of present illness. He was evaluated in the context of the global COVID-19 pandemic, which necessitated consideration that the patient might be at risk for infection with the SARS-CoV-2 virus that causes COVID-19. Institutional protocols and algorithms that pertain to the evaluation of patients at risk for COVID-19 are in a state of rapid change based on information released by regulatory bodies including the CDC and federal and state organizations. These policies and algorithms were followed during the patient's care in the ED.  Patient is presenting for evaluation of reported transient episode  of headache and confusion.  Symptoms lasted approximately 1 hour.  Symptoms resolved upon arrival to the ED.  Patient does not meet criteria  for TPA.  Patient's case was discussed with the neuro hospitalist on-call, Dr. Malen Gauze.  Patient would likely benefit from further work-up as an inpatient for possible TIA versus hypertensive urgency.  Medicine service is aware of case and will evaluate for admission.   Final Clinical Impressions(s) / ED Diagnoses   Final diagnoses:  Hypertension, unspecified type  Transient confusion    ED Discharge Orders    None       Valarie Merino, MD 02/28/19 1719

## 2019-02-28 NOTE — Consult Note (Signed)
Neurology Consultation  Reason for Consult: TIA Referring Physician: Dr. Francia Greaves  CC: Confusion, word finding difficulty, right-sided weakness  History is obtained from: Patient, chart, patient's daughter who is a Designer, jewellery nurse at his bedside  HPI: Eric Stokes is a 73 y.o. male who has a past medical history of hypertension with intermittent compliance to medications, former smoker, hyperlipidemia, abnormal EKG with frequent PVCs and PACs, obstructive sleep apnea, patient of the [place] system, presenting to the emergency room for an episode that happened this afternoon consisting of word finding difficulty and confusion. He reports having been working in the yard, trying to pick beings and all of a sudden unable to bring words out to explain what he was doing when asked.  He also had sudden headache and this was followed by a significant.  Of about 30 minutes to an hour of confusion where he did not know what exactly was going on.  His symptoms then resolved with the exception of some right leg weakness that he continues to complain for even after that time. The patient denies any such symptoms in the past.  The daughter reports that he has been having some difficulty with his cognition especially memory over the past few years but nothing that precludes him from doing his ADLs.  He is otherwise a very functional gentleman. He is a retired Automotive engineer of the [employer], follows at the [employer], and is not very keen on coming to the hospital but today's episode was concerning enough to bring him to the hospital because he was worried about his health. He denies any chest pains, preceding sicknesses or illnesses, nausea vomiting.  He currently has some trouble with his walking due to severe arthritis in both his knees, he is awaiting surgery but in the meantime is getting injections for relief.  LKW: Around 12 PM on 02/28/2019 tpa given?: no, complete resolution of  symptoms Premorbid modified Rankin scale (mRS): 0 ROS: ROS was performed and is negative except as noted in the HPI.   Past Medical History:  Diagnosis Date  . Abnormal EKG   . Benign essential hypertension, on Norvasc and Amlodipine 12/21/2008   Followed by [facility]   . BPH (benign prostatic hyperplasia) 09/03/2012  . Dyslipidemia 12/21/2008   Qualifier: Diagnosis of  By: Percival Spanish, MD, Farrel Gordon    . Elevated PSA 09/03/2012  . Essential hypertension, benign 12/21/2008   Qualifier: Diagnosis of  By: Percival Spanish, MD, Farrel Gordon    . Former smoker, stopped smoking in distant past 03/15/2018  . Hematuria 09/16/2011  . Hx of colonic polyps 09/03/2012   Last colonoscopy March 2012 by Dr. Collene Mares   . Obesity, unspecified 12/21/2008   Qualifier: Diagnosis of  By: Percival Spanish, MD, Farrel Gordon    . OSA on CPAP 09/25/2015   Diagnosed at the [facility] 2016   . Vitamin D deficiency 11/26/2010    Family History  Problem Relation Age of Onset  . Diabetes Mother   . COPD Father   . Stroke Paternal Grandfather     Social History:   reports that he quit smoking about 53 years ago. His smoking use included cigarettes. He has never used smokeless tobacco. He reports current alcohol use of about 2.0 - 6.0 standard drinks of alcohol per week. He reports that he does not use drugs.  Medications  Current Facility-Administered Medications:  .  sodium chloride flush (NS) 0.9 % injection 3 mL, 3 mL, Intravenous, Once, Dene Gentry  C, MD  Current Outpatient Medications:  .  aspirin 81 MG EC tablet, Take 81 mg by mouth daily. , Disp: , Rfl:  .  cetirizine (ZYRTEC) 10 MG tablet, Take 10 mg by mouth daily. , Disp: , Rfl:  .  Cholecalciferol (VITAMIN D3) 2000 UNITS TABS, Take 2,000 Int'l Units by mouth daily. , Disp: , Rfl:  .  doxycycline (VIBRA-TABS) 100 MG tablet, Take 1 tablet (100 mg total) by mouth 2 (two) times daily., Disp: 20 tablet, Rfl: 0 .  fluticasone (FLONASE) 50 MCG/ACT nasal spray, USE  TWO SPRAYS IN EACH NOSTRIL DAILY AS NEEDED. (Patient taking differently: Place 2 sprays into both nostrils daily. ), Disp: 48 g, Rfl: 1 .  fluticasone (FLOVENT DISKUS) 50 MCG/BLIST diskus inhaler, Inhale 1 puff into the lungs 2 (two) times daily., Disp: 1 Inhaler, Rfl: 12 .  ibuprofen (ADVIL,MOTRIN) 200 MG tablet, Take 400 mg by mouth at bedtime., Disp: , Rfl:  .  latanoprost (XALATAN) 0.005 % ophthalmic solution, INSTILL 1 DROP INTO BOTH EYES AT BEDTIME (Patient taking differently: Place 1 drop into both eyes daily. ), Disp: 7.5 mL, Rfl: 5 .  losartan (COZAAR) 25 MG tablet, TAKE 1 TABLET BY MOUTH EVERY DAY (Patient taking differently: Take 25 mg by mouth at bedtime. ), Disp: 90 tablet, Rfl: 0 .  montelukast (SINGULAIR) 10 MG tablet, Take 1 tablet (10 mg total) by mouth daily., Disp: 90 tablet, Rfl: 1 .  pravastatin (PRAVACHOL) 20 MG tablet, TAKE 1 TABLET (20 MG TOTAL) BY MOUTH 3 (THREE) TIMES A WEEK., Disp: 36 tablet, Rfl: 1 .  tamsulosin (FLOMAX) 0.4 MG CAPS capsule, Take 0.4 mg by mouth daily., Disp: , Rfl:  .  albuterol (PROVENTIL) (2.5 MG/3ML) 0.083% nebulizer solution, Take 3 mLs (2.5 mg total) by nebulization every 6 (six) hours as needed for wheezing or shortness of breath. (Patient not taking: Reported on 02/28/2019), Disp: 75 mL, Rfl: 12 .  amLODipine (NORVASC) 5 MG tablet, Take 1 tablet (5 mg total) by mouth daily. (Patient not taking: Reported on 02/28/2019), Disp: 90 tablet, Rfl: 1  Exam: Current vital signs: BP 130/73   Pulse (!) 52   Temp 97.8 F (36.6 C) (Oral)   Resp 13   SpO2 99%  Vital signs in last 24 hours: Temp:  [97.8 F (36.6 C)] 97.8 F (36.6 C) (07/13 1337) Pulse Rate:  [52-66] 52 (07/13 1600) Resp:  [13-20] 13 (07/13 1600) BP: (130-172)/(72-92) 130/73 (07/13 1600) SpO2:  [97 %-99 %] 99 % (07/13 1600) GENERAL: Awake, alert in NAD HEENT: - Normocephalic and atraumatic, dry mm, no LN++, no Thyromegally LUNGS - Clear to auscultation bilaterally with no wheezes CV -  S1S2 RRR, no m/r/g, equal pulses bilaterally. ABDOMEN - Soft, nontender, nondistended with normoactive BS Ext: warm, well perfused, intact peripheral pulses  NEURO:  Mental Status: AA&Ox3  Language: speech is non-dysarthric naming, repetition, fluency, and comprehension intact. Cranial Nerves: PERRL EOMI, visual fields full, no facial asymmetry, facial sensation intact, hearing intact, tongue/uvula/soft palate midline, normal sternocleidomastoid and trapezius muscle strength. No evidence of tongue atrophy or fibrillations Motor: 5/5 with no drift Tone: is normal and bulk is normal Sensation- Intact to light touch bilaterally with the exception of mildly decreased sensation on the left inner leg in a nondermatomal distribution. Coordination: FTN intact bilaterally Gait- deferred  NIHSS - 1 for sensory   Labs I have reviewed labs in epic and the results pertinent to this consultation are:  CBC    Component Value Date/Time  WBC 6.0 02/28/2019 1353   RBC 4.55 02/28/2019 1353   HGB 13.9 02/28/2019 1353   HCT 40.2 02/28/2019 1353   PLT 224 02/28/2019 1353   MCV 88.4 02/28/2019 1353   MCH 30.5 02/28/2019 1353   MCHC 34.6 02/28/2019 1353   RDW 13.2 02/28/2019 1353   LYMPHSABS 1.5 06/15/2018 0801   MONOABS 0.3 06/15/2018 0801   EOSABS 0.1 06/15/2018 0801   BASOSABS 0.0 06/15/2018 0801    CMP     Component Value Date/Time   NA 136 02/28/2019 1353   NA 138 10/19/2017   K 3.9 02/28/2019 1353   CL 103 02/28/2019 1353   CO2 22 02/28/2019 1353   GLUCOSE 104 (H) 02/28/2019 1353   BUN 13 02/28/2019 1353   BUN 22 (A) 05/04/2014   CREATININE 0.82 02/28/2019 1353   CALCIUM 9.0 02/28/2019 1353   PROT 6.8 07/28/2018 0859   ALBUMIN 4.2 07/28/2018 0859   AST 13 07/28/2018 0859   ALT 11 07/28/2018 0859   ALKPHOS 43 07/28/2018 0859   BILITOT 0.9 07/28/2018 0859   GFRNONAA >60 02/28/2019 1353   GFRAA >60 02/28/2019 1353   Imaging I have reviewed the images obtained:  CT-scan of  the brain-no acute changes.  Assessment: 73 year old man with above said past medical history presenting for evaluation of 30 minutes to 1 hour episode of word finding difficulty confusion and dizziness. Symptoms resolved spontaneously without any intervention. He has risk factors for stroke/TIA and this episode could have reflected a TIA or a small stroke. He would benefit from further stroke risk factor work-up.   Impression: Evaluate for stroke/TIA  Recommendations: -Admit to medicine -Telemetry monitoring -Allow for permissive hypertension for the first 24-48h - only treat PRN if SBP >220 mmHg. Blood pressures can be gradually normalized to SBP<140 upon discharge. -MRI brain without contrast -CT Angiogram of Head and neck -Echocardiogram -HgbA1c, fasting lipid panel -Frequent neuro checks -Prophylactic therapy-Antiplatelet med: Aspirin - dose 325mg  PO or 300mg  PR -Atorvastatin 80 mg PO daily -Risk factor modification -PT consult, OT consult, Speech consult -If Afib found on telemetry, will need anticoagulation. Decision pending imaging and stroke team rounding.  Please page stroke NP/PA/MD (listed on AMION)  from 8am-4 pm as this patient will be followed by the stroke team at this point.  -- Amie Portland, MD [facility] Pager: [phone] If 7pm to 7am, please call on call as listed on AMION.

## 2019-02-28 NOTE — ED Triage Notes (Signed)
Pt. Stated, I have had a UTI and taking flomax and I read were it can make you dizzy.  Today after I got back from taking a catheter out , I went outside to pick beans and I started getting dizzy and light headedness. My rt. Leg feels numb and at one time I couldn't even remember my granddaughters name.  Oriented to Month, year, DOB

## 2019-02-28 NOTE — ED Notes (Signed)
ED TO INPATIENT HANDOFF REPORT  ED Nurse Name and Phone #: Benjamine Mola [phone]  S Name/Age/Gender Eric Stokes 73 y.o. male Room/Bed: 038C/038C  Code Status   Code Status: Not on file  Home/SNF/Other Home Patient oriented to: self, place, time and situation Is this baseline? Yes   Triage Complete: Triage complete  Chief Complaint stroke sx  Triage Note Pt. Stated, I have had a UTI and taking flomax and I read were it can make you dizzy.  Today after I got back from taking a catheter out , I went outside to pick beans and I started getting dizzy and light headedness. My rt. Leg feels numb and at one time I couldn't even remember my granddaughters name.  Oriented to Month, year, DOB    Allergies Allergies  Allergen Reactions  . Lipitor [Atorvastatin] Other (See Comments)    Leg Cramps  . Lisinopril Cough    Level of Care/Admitting Diagnosis ED Disposition    ED Disposition Condition [facility]: [place] [100100]  Level of Care: Telemetry Medical [104]  I expect the patient will be discharged within 24 hours: Yes  LOW acuity---Tx typically complete <24 hrs---ACUTE conditions typically can be evaluated <24 hours---LABS likely to return to acceptable levels <24 hours---IS near functional baseline---EXPECTED to return to current living arrangement---NOT newly hypoxic: Meets criteria for 5C-Observation unit  Covid Evaluation: Asymptomatic Screening Protocol (No Symptoms)  Diagnosis: TIA (transient ischemic attack) [696789]  Admitting Physician: Vashti Hey [[id number]]  Attending Physician: Vashti Hey [[id number]]  PT Class (Do Not Modify): Observation [104]  PT Acc Code (Do Not Modify): Observation [10022]       B Medical/Surgery History Past Medical History:  Diagnosis Date  . Abnormal EKG   . Benign essential hypertension, on Norvasc and Amlodipine 12/21/2008   Followed by [facility]   .  BPH (benign prostatic hyperplasia) 09/03/2012  . Dyslipidemia 12/21/2008   Qualifier: Diagnosis of  By: Percival Spanish, MD, Farrel Gordon    . Elevated PSA 09/03/2012  . Essential hypertension, benign 12/21/2008   Qualifier: Diagnosis of  By: Percival Spanish, MD, Farrel Gordon    . Former smoker, stopped smoking in distant past 03/15/2018  . Hematuria 09/16/2011  . Hx of colonic polyps 09/03/2012   Last colonoscopy March 2012 by Dr. Collene Mares   . Obesity, unspecified 12/21/2008   Qualifier: Diagnosis of  By: Percival Spanish, MD, Farrel Gordon    . OSA on CPAP 09/25/2015   Diagnosed at the [facility] 2016   . Vitamin D deficiency 11/26/2010   Past Surgical History:  Procedure Laterality Date  . CHOLECYSTECTOMY  2001  . TONSILLECTOMY AND ADENOIDECTOMY  1952     A IV Location/Drains/Wounds Patient Lines/Drains/Airways Status   Active Line/Drains/Airways    Name:   Placement date:   Placement time:   Site:   Days:   Peripheral IV 02/28/19 Right Antecubital   02/28/19    1728    Antecubital   less than 1          Intake/Output Last 24 hours  Intake/Output Summary (Last 24 hours) at 02/28/2019 1829 Last data filed at 02/28/2019 1523 Gross per 24 hour  Intake 0 ml  Output 0 ml  Net 0 ml    Labs/Imaging Results for orders placed or performed during the hospital encounter of 02/28/19 (from the past 48 hour(s))  Basic metabolic panel     Status: Abnormal   Collection Time: 02/28/19  1:53  PM  Result Value Ref Range   Sodium 136 135 - 145 mmol/L   Potassium 3.9 3.5 - 5.1 mmol/L   Chloride 103 98 - 111 mmol/L   CO2 22 22 - 32 mmol/L   Glucose, Bld 104 (H) 70 - 99 mg/dL   BUN 13 8 - 23 mg/dL   Creatinine, Ser 0.82 0.61 - 1.24 mg/dL   Calcium 9.0 8.9 - 10.3 mg/dL   GFR calc non Af Amer >60 >60 mL/min   GFR calc Af Amer >60 >60 mL/min   Anion gap 11 5 - 15    Comment: Performed at [facility] [address] [place], [place] [address]  CBC     Status: None   Collection Time: 02/28/19  1:53 PM  Result  Value Ref Range   WBC 6.0 4.0 - 10.5 K/uL   RBC 4.55 4.22 - 5.81 MIL/uL   Hemoglobin 13.9 13.0 - 17.0 g/dL   HCT 40.2 39.0 - 52.0 %   MCV 88.4 80.0 - 100.0 fL   MCH 30.5 26.0 - 34.0 pg   MCHC 34.6 30.0 - 36.0 g/dL   RDW 13.2 11.5 - 15.5 %   Platelets 224 150 - 400 K/uL   nRBC 0.0 0.0 - 0.2 %    Comment: Performed at [facility], [place] [address] [place], [place] [address]  Troponin I (High Sensitivity)     Status: None   Collection Time: 02/28/19  1:53 PM  Result Value Ref Range   Troponin I (High Sensitivity) 6 <18 ng/L    Comment: (NOTE) Elevated high sensitivity troponin I (hsTnI) values and significant  changes across serial measurements may suggest ACS but many other  chronic and acute conditions are known to elevate hsTnI results.  Refer to the "Links" section for chest pain algorithms and additional  guidance. Performed at [facility], [place] [address] [place], [place] [address]   CBG monitoring, ED     Status: None   Collection Time: 02/28/19  3:16 PM  Result Value Ref Range   Glucose-Capillary 91 70 - 99 mg/dL   Comment 1 Notify RN    Comment 2 Document in Chart   Urinalysis, Routine w reflex microscopic     Status: Abnormal   Collection Time: 02/28/19  3:27 PM  Result Value Ref Range   Color, Urine YELLOW YELLOW   APPearance HAZY (A) CLEAR   Specific Gravity, Urine 1.008 1.005 - 1.030   pH 5.0 5.0 - 8.0   Glucose, UA NEGATIVE NEGATIVE mg/dL   Hgb urine dipstick MODERATE (A) NEGATIVE   Bilirubin Urine NEGATIVE NEGATIVE   Ketones, ur NEGATIVE NEGATIVE mg/dL   Protein, ur NEGATIVE NEGATIVE mg/dL   Nitrite NEGATIVE NEGATIVE   Leukocytes,Ua MODERATE (A) NEGATIVE   RBC / HPF 0-5 0 - 5 RBC/hpf   WBC, UA 6-10 0 - 5 WBC/hpf   Bacteria, UA RARE (A) NONE SEEN   Squamous Epithelial / LPF 0-5 0 - 5   Mucus PRESENT     Comment: Performed at [facility], [address], [place], [place] [address]  Troponin I (High Sensitivity)     Status: None    Collection Time: 02/28/19  5:05 PM  Result Value Ref Range   Troponin I (High Sensitivity) 6 <18 ng/L    Comment: (NOTE) Elevated high sensitivity troponin I (hsTnI) values and significant  changes across serial measurements may suggest ACS but many other  chronic and acute  conditions are known to elevate hsTnI results.  Refer to the "Links" section for chest pain algorithms and additional  guidance. Performed at [facility], [place] [address], [place], [place] [address]    Ct Head Wo Contrast  Result Date: 02/28/2019 CLINICAL DATA:  Headache, dizziness EXAM: CT HEAD WITHOUT CONTRAST TECHNIQUE: Contiguous axial images were obtained from the base of the skull through the vertex without intravenous contrast. COMPARISON:  None. FINDINGS: Brain: No evidence of acute infarction, hemorrhage, hydrocephalus, extra-axial collection or mass lesion/mass effect. Vascular: No hyperdense vessel or unexpected calcification. Skull: Normal. Negative for fracture or focal lesion. Sinuses/Orbits: No acute finding. Other: None. IMPRESSION: No acute intracranial pathology. No non-contrast CT findings to explain headache or dizziness. Electronically Signed   By: Eddie Candle M.D.   On: 02/28/2019 16:47    Pending Labs Unresulted Labs (From admission, onward)    Start     Ordered   02/28/19 1712  SARS Coronavirus 2 (CEPHEID - Performed in [facility] hospital lab), Hosp Order  (Asymptomatic Patients Labs)  Once,   STAT    Question:  Rule Out  Answer:  Yes   02/28/19 1711   Signed and Held  Hemoglobin A1c  Tomorrow morning,   R     Signed and Held   Signed and Held  Lipid panel  Tomorrow morning,   R    Comments: Fasting    Signed and Held          Vitals/Pain Today's Vitals   02/28/19 1530 02/28/19 1600 02/28/19 1759 02/28/19 1800  BP: (!) 141/72 130/73  (!) 161/95  Pulse: (!) 57 (!) 52 69   Resp: 17 13  19   Temp:      TempSrc:      SpO2: 98% 99% 95%     Isolation Precautions No active  isolations  Medications Medications  sodium chloride flush (NS) 0.9 % injection 3 mL (has no administration in time range)  sodium chloride 0.9 % bolus 500 mL (500 mLs Intravenous New Bag/Given 02/28/19 1730)    Mobility walks Moderate fall risk   Focused Assessments     R Recommendations: See Admitting Provider Note  Report given to:   Additional Notes:

## 2019-02-28 NOTE — ED Notes (Signed)
Called lab to ad on trop

## 2019-03-01 ENCOUNTER — Observation Stay (HOSPITAL_COMMUNITY): Payer: Medicare Other

## 2019-03-01 ENCOUNTER — Observation Stay (HOSPITAL_BASED_OUTPATIENT_CLINIC_OR_DEPARTMENT_OTHER): Payer: Medicare Other

## 2019-03-01 ENCOUNTER — Encounter (HOSPITAL_COMMUNITY): Payer: Self-pay | Admitting: *Deleted

## 2019-03-01 DIAGNOSIS — E785 Hyperlipidemia, unspecified: Secondary | ICD-10-CM | POA: Diagnosis not present

## 2019-03-01 DIAGNOSIS — G459 Transient cerebral ischemic attack, unspecified: Secondary | ICD-10-CM

## 2019-03-01 DIAGNOSIS — I10 Essential (primary) hypertension: Secondary | ICD-10-CM | POA: Diagnosis not present

## 2019-03-01 DIAGNOSIS — G4733 Obstructive sleep apnea (adult) (pediatric): Secondary | ICD-10-CM | POA: Diagnosis not present

## 2019-03-01 DIAGNOSIS — R410 Disorientation, unspecified: Secondary | ICD-10-CM | POA: Diagnosis not present

## 2019-03-01 DIAGNOSIS — R51 Headache: Secondary | ICD-10-CM | POA: Diagnosis not present

## 2019-03-01 DIAGNOSIS — Z9989 Dependence on other enabling machines and devices: Secondary | ICD-10-CM | POA: Diagnosis not present

## 2019-03-01 LAB — LIPID PANEL
Cholesterol: 161 mg/dL (ref 0–200)
HDL: 38 mg/dL — ABNORMAL LOW (ref >40)
LDL Cholesterol: 104 mg/dL — ABNORMAL HIGH (ref 0–99)
Total CHOL/HDL Ratio: 4.2 RATIO
Triglycerides: 94 mg/dL (ref <150)
VLDL: 19 mg/dL (ref 0–40)

## 2019-03-01 LAB — ECHOCARDIOGRAM COMPLETE
Height: 70.5 in
Weight: 3816.6 oz

## 2019-03-01 LAB — HEMOGLOBIN A1C
Hgb A1c MFr Bld: 5.6 % (ref 4.8–5.6)
Mean Plasma Glucose: 114.02 mg/dL

## 2019-03-01 NOTE — Discharge Instructions (Signed)
[name],  You had symptoms initially concerning for stroke or TIA, however, the neurologist believes this is secondary to migraine. Please follow-up with the neurologist.

## 2019-03-01 NOTE — Evaluation (Signed)
Speech Language Pathology Evaluation Patient Details Name: PHIL MICHELS MRN: [id number] DOB: 01/19/1946 Today's Date: 03/01/2019 Time: 3382-5053 SLP Time Calculation (min) (ACUTE ONLY): 19 min  Problem List:  Patient Active Problem List   Diagnosis Date Noted  . TIA (transient ischemic attack) 02/28/2019  . At risk for bradycardia 06/15/2018  . B12 deficiency, with monthly injections 06/15/2018  . Carotid bruit present 06/15/2018  . Primary osteoarthritis of right knee, followed by Orthopedics 04/12/2018  . Asthma, allergic, prn albuterol, triggered by perfumes 03/19/2018  . Former smoker, stopped smoking in distant past 03/15/2018  . OSA on CPAP 09/25/2015  . Elevated PSA 09/03/2012  . BPH (benign prostatic hyperplasia) 09/03/2012  . Hx of colonic polyps 09/03/2012  . Hematuria 09/16/2011  . Vitamin D deficiency 11/26/2010  . Allergic rhinitis, using Flonase 11/20/2010  . Hearing loss 11/20/2010  . Dyslipidemia, on Pravastatin 12/21/2008  . Benign essential hypertension 12/21/2008   Past Medical History:  Past Medical History:  Diagnosis Date  . Abnormal EKG   . Arthritis   . Benign essential hypertension, on Norvasc and Amlodipine 12/21/2008   Followed by [facility]   . BPH (benign prostatic hyperplasia) 09/03/2012  . COPD (chronic obstructive pulmonary disease) ([place])   . Dyslipidemia 12/21/2008   Qualifier: Diagnosis of  By: Percival Spanish, MD, Farrel Gordon    . Elevated PSA 09/03/2012  . Essential hypertension, benign 12/21/2008   Qualifier: Diagnosis of  By: Percival Spanish, MD, Farrel Gordon    . Former smoker, stopped smoking in distant past 03/15/2018  . Headache   . Hematuria 09/16/2011  . Hx of colonic polyps 09/03/2012   Last colonoscopy March 2012 by Dr. Collene Mares   . Obesity, unspecified 12/21/2008   Qualifier: Diagnosis of  By: Percival Spanish, MD, Farrel Gordon    . OSA on CPAP 09/25/2015   Diagnosed at the [facility] 2016   . Vitamin D deficiency 11/26/2010   Past Surgical  History:  Past Surgical History:  Procedure Laterality Date  . CHOLECYSTECTOMY  2001  . TONSILLECTOMY AND ADENOIDECTOMY  1952   HPI:  Pt is a 73 y.o. male with past medical history significant for hypertension, previous tobacco use and OSA who was in his usual state of relatively good who presented with dizziness and confusion which he was aware of. MRI was negative for acute changes.    Assessment / Plan / Recommendation Clinical Impression  Pt reported that he was living independently prior to admission and he denied any baseline deficits in speech, language, cognition other than those related to "being an old man". He stated that his noted confusion has resolved and he believes he is currently at baseline. His speech and language skills are currently within normal limits and no overt cognitive deficits were noted. Further skilled SLP services are not clinically indicated at this time. Pt and nursing were educated regarding this and both  parties verbalized understanding as well as agreement with plan of care.    SLP Assessment  SLP Recommendation/Assessment: Patient does not need any further Speech Lanaguage Pathology Services SLP Visit Diagnosis: Cognitive communication deficit (R41.841)    Follow Up Recommendations  None    Frequency and Duration           SLP Evaluation Cognition  Overall Cognitive Status: Within Functional Limits for tasks assessed Arousal/Alertness: Awake/alert Orientation Level: Oriented X4 Attention: Focused;Sustained Focused Attention: Appears intact Sustained Attention: Appears intact Memory: Appears intact(Immediate: 3/3; Dealyed: 3/3) Awareness: Appears intact Problem Solving: Appears intact(5/5) Executive Function:  Reasoning Reasoning: Appears intact       Comprehension  Auditory Comprehension Overall Auditory Comprehension: Appears within functional limits for tasks assessed Yes/No Questions: Within Functional Limits Basic Biographical  Questions: (5/5) Complex Questions: (5/5) Paragraph Comprehension (via yes/no questions): (3/4) Commands: Within Functional Limits Two Step Basic Commands: (4/4) Multistep Basic Commands: (4/4) Conversation: Complex Visual Recognition/Discrimination Discrimination: Within Function Limits Reading Comprehension Reading Status: Within funtional limits    Expression Expression Primary Mode of Expression: Verbal Verbal Expression Overall Verbal Expression: Appears within functional limits for tasks assessed Initiation: No impairment Automatic Speech: Counting;Day of week;Month of year(WNL) Level of Generative/Spontaneous Verbalization: Conversation Repetition: No impairment Naming: No impairment Responsive: (5/5) Confrontation: Within functional limits Convergent: (Sentence completion: 5/5) Pragmatics: No impairment Written Expression Dominant Hand: Right   Oral / Motor  Oral Motor/Sensory Function Overall Oral Motor/Sensory Function: Within functional limits Motor Speech Overall Motor Speech: Appears within functional limits for tasks assessed Respiration: Within functional limits Phonation: Normal Resonance: Within functional limits Articulation: Within functional limitis Intelligibility: Intelligible Motor Planning: Witnin functional limits Motor Speech Errors: Not applicable   Josejulian Tarango I. Hardin Negus, [place], [place] Office number [phone] Pager [place] 03/01/2019, 10:10 AM

## 2019-03-01 NOTE — Progress Notes (Signed)
Pt given discharge summary and discharged home via daughter as transportation.

## 2019-03-01 NOTE — Progress Notes (Addendum)
STROKE TEAM PROGRESS NOTE   INTERVAL HISTORY Pt sitting in chair, symptoms all resolved. Stroke work up unremarkable. Pt recounted HPI with me. Pt stated that he was working in yard yesterday no dehydration or overexertion. Had sudden onset mild dizziness, HA, confusion, sluggish feeling and word finding difficulty. He had similar episode 12 years ago and resolved in one hour. He had experienced visual migraine aura 3-4 times a year with lighting bulb in one or the other side of visual field lasting 10 min with nausea and needs to go dark room lie down. However, no or minimal HA during the episodes. His daughter has similar visual aura.    Vitals:   03/01/19 0300 03/01/19 0500 03/01/19 0830 03/01/19 1207  BP:  133/89 133/77 (!) 145/87  Pulse:  (!) 50 62 (!) 55  Resp:  18 20 20   Temp:  97.6 F (36.4 C) 98 F (36.7 C) 97.9 F (36.6 C)  TempSrc:  Oral Oral Oral  SpO2:  99% 99% 99%  Weight: 108.2 kg     Height: 5' 10.5" (1.791 m)       CBC:  Recent Labs  Lab 02/28/19 1353  WBC 6.0  HGB 13.9  HCT 40.2  MCV 88.4  PLT 242    Basic Metabolic Panel:  Recent Labs  Lab 02/28/19 1353  NA 136  K 3.9  CL 103  CO2 22  GLUCOSE 104*  BUN 13  CREATININE 0.82  CALCIUM 9.0   Lipid Panel:     Component Value Date/Time   CHOL 161 03/01/2019 0449   TRIG 94 03/01/2019 0449   HDL 38 (L) 03/01/2019 0449   CHOLHDL 4.2 03/01/2019 0449   VLDL 19 03/01/2019 0449   LDLCALC 104 (H) 03/01/2019 0449   HgbA1c:  Lab Results  Component Value Date   HGBA1C 5.6 03/01/2019   Urine Drug Screen: No results found for: LABOPIA, COCAINSCRNUR, LABBENZ, AMPHETMU, THCU, LABBARB  Alcohol Level No results found for: ETH  IMAGING Ct Angio Head W Or Wo Contrast  Result Date: 02/28/2019 CLINICAL DATA:  TIA EXAM: CT ANGIOGRAPHY HEAD AND NECK TECHNIQUE: Multidetector CT imaging of the head and neck was performed using the standard protocol during bolus administration of intravenous contrast. Multiplanar CT  image reconstructions and MIPs were obtained to evaluate the vascular anatomy. Carotid stenosis measurements (when applicable) are obtained utilizing NASCET criteria, using the distal internal carotid diameter as the denominator. CONTRAST:  39mL OMNIPAQUE IOHEXOL 350 MG/ML SOLN COMPARISON:  CT head today FINDINGS: CTA NECK FINDINGS Aortic arch: Standard branching. Imaged portion shows no evidence of aneurysm or dissection. No significant stenosis of the major arch vessel origins. Mild atherosclerotic disease in the aortic arch. Right carotid system: Mild atherosclerotic disease right carotid bifurcation without significant stenosis or irregularity Left carotid system: Mild atherosclerotic disease left bifurcation without significant stenosis or irregularity. Vertebral arteries: Left vertebral artery dominant. Mild to moderate stenosis left vertebral artery at the skull base. Moderate stenosis distal right vertebral artery at the skull base. Skeleton: Cervical spondylosis without acute skeletal abnormality. Other neck: Negative for mass or adenopathy. Upper chest: Negative Review of the MIP images confirms the above findings CTA HEAD FINDINGS Anterior circulation: Atherosclerotic calcification in the cavernous carotid bilaterally with mild stenosis bilaterally. Anterior and middle cerebral arteries patent bilaterally without stenosis or branch occlusion. Posterior circulation: Moderate stenosis distal vertebral artery bilaterally at the skull base. Right vertebral artery ends in PICA. Left vertebral artery supplies the basilar. PICA patent bilaterally. Basilar widely patent.  Superior cerebellar and posterior cerebral arteries patent bilaterally without significant stenosis. Venous sinuses: Normal venous enhancement Anatomic variants: None Review of the MIP images confirms the above findings IMPRESSION: Mild atherosclerotic disease in the carotid bifurcation without significant stenosis. Mild stenosis in the cavernous  carotid bilaterally. Moderate stenosis distal vertebral artery bilaterally. Right vertebral artery ends in PICA. Negative for intracranial large vessel occlusion. Electronically Signed   By: Franchot Gallo M.D.   On: 02/28/2019 21:47   Ct Head Wo Contrast  Result Date: 02/28/2019 CLINICAL DATA:  Headache, dizziness EXAM: CT HEAD WITHOUT CONTRAST TECHNIQUE: Contiguous axial images were obtained from the base of the skull through the vertex without intravenous contrast. COMPARISON:  None. FINDINGS: Brain: No evidence of acute infarction, hemorrhage, hydrocephalus, extra-axial collection or mass lesion/mass effect. Vascular: No hyperdense vessel or unexpected calcification. Skull: Normal. Negative for fracture or focal lesion. Sinuses/Orbits: No acute finding. Other: None. IMPRESSION: No acute intracranial pathology. No non-contrast CT findings to explain headache or dizziness. Electronically Signed   By: Eddie Candle M.D.   On: 02/28/2019 16:47   Ct Angio Neck W Or Wo Contrast  Result Date: 02/28/2019 CLINICAL DATA:  TIA EXAM: CT ANGIOGRAPHY HEAD AND NECK TECHNIQUE: Multidetector CT imaging of the head and neck was performed using the standard protocol during bolus administration of intravenous contrast. Multiplanar CT image reconstructions and MIPs were obtained to evaluate the vascular anatomy. Carotid stenosis measurements (when applicable) are obtained utilizing NASCET criteria, using the distal internal carotid diameter as the denominator. CONTRAST:  58mL OMNIPAQUE IOHEXOL 350 MG/ML SOLN COMPARISON:  CT head today FINDINGS: CTA NECK FINDINGS Aortic arch: Standard branching. Imaged portion shows no evidence of aneurysm or dissection. No significant stenosis of the major arch vessel origins. Mild atherosclerotic disease in the aortic arch. Right carotid system: Mild atherosclerotic disease right carotid bifurcation without significant stenosis or irregularity Left carotid system: Mild atherosclerotic  disease left bifurcation without significant stenosis or irregularity. Vertebral arteries: Left vertebral artery dominant. Mild to moderate stenosis left vertebral artery at the skull base. Moderate stenosis distal right vertebral artery at the skull base. Skeleton: Cervical spondylosis without acute skeletal abnormality. Other neck: Negative for mass or adenopathy. Upper chest: Negative Review of the MIP images confirms the above findings CTA HEAD FINDINGS Anterior circulation: Atherosclerotic calcification in the cavernous carotid bilaterally with mild stenosis bilaterally. Anterior and middle cerebral arteries patent bilaterally without stenosis or branch occlusion. Posterior circulation: Moderate stenosis distal vertebral artery bilaterally at the skull base. Right vertebral artery ends in PICA. Left vertebral artery supplies the basilar. PICA patent bilaterally. Basilar widely patent. Superior cerebellar and posterior cerebral arteries patent bilaterally without significant stenosis. Venous sinuses: Normal venous enhancement Anatomic variants: None Review of the MIP images confirms the above findings IMPRESSION: Mild atherosclerotic disease in the carotid bifurcation without significant stenosis. Mild stenosis in the cavernous carotid bilaterally. Moderate stenosis distal vertebral artery bilaterally. Right vertebral artery ends in PICA. Negative for intracranial large vessel occlusion. Electronically Signed   By: Franchot Gallo M.D.   On: 02/28/2019 21:47   Mr Brain Wo Contrast  Result Date: 03/01/2019 CLINICAL DATA:  73 year old male with episode of confusion and word-finding difficulty. Headache. EXAM: MRI HEAD WITHOUT CONTRAST TECHNIQUE: Multiplanar, multiecho pulse sequences of the brain and surrounding structures were obtained without intravenous contrast. COMPARISON:  CTA head and neck 02/28/2019. FINDINGS: Brain: No restricted diffusion to suggest acute infarction. No midline shift, mass effect,  evidence of mass lesion, ventriculomegaly, extra-axial collection or acute intracranial hemorrhage.  Cervicomedullary junction and pituitary are within normal limits. Pearline Cables and white matter signal is within normal limits for age throughout the brain. No cortical encephalomalacia. No chronic cerebral blood products. Vascular: Major intracranial vascular flow voids are preserved. Skull and upper cervical spine: Negative visible cervical spine. Visualized bone marrow signal is within normal limits. Sinuses/Orbits: Negative orbits. Mild maxillary sinus mucosal thickening. Other: Mastoids are clear. Visible internal auditory structures appear normal. Scalp and face soft tissues appear negative. IMPRESSION: Normal for age noncontrast MRI appearance of the brain. Electronically Signed   By: Genevie Ann M.D.   On: 03/01/2019 02:29   EEG This is a normal awake electroencephalogram with activation procedures. There are no focal lateralizing or epileptiform features.   PHYSICAL EXAM  Temp:  [97.6 F (36.4 C)-98 F (36.7 C)] 97.9 F (36.6 C) (07/14 1207) Pulse Rate:  [50-69] 55 (07/14 1207) Resp:  [13-20] 20 (07/14 1207) BP: (120-172)/(70-95) 145/87 (07/14 1207) SpO2:  [95 %-100 %] 99 % (07/14 1207) Weight:  [108.2 kg] 108.2 kg (07/14 0300)  General - Well nourished, well developed, in no apparent distress.  Ophthalmologic - fundi not visualized due to noncooperation.  Cardiovascular - Regular rate and rhythm.  Mental Status -  Level of arousal and orientation to time, place, and person were intact. Language including expression, naming, repetition, comprehension was assessed and found intact. Attention span and concentration were normal. Recent and remote memory were intact. Fund of Knowledge was assessed and was intact.  Cranial Nerves II - XII - II - Visual field intact OU. III, IV, VI - Extraocular movements intact. V - Facial sensation intact bilaterally. VII - Facial movement intact  bilaterally. VIII - Hearing & vestibular intact bilaterally. X - Palate elevates symmetrically. XI - Chin turning & shoulder shrug intact bilaterally. XII - Tongue protrusion intact  Motor Strength - The patient's strength was normal in all extremities and pronator drift was absent.  Bulk was normal and fasciculations were absent.   Motor Tone - Muscle tone was assessed at the neck and appendages and was normal.  Reflexes - The patient's reflexes were symmetrical in all extremities and he had no pathological reflexes.  Sensory - Light touch, temperature/pinprick were assessed and were symmetrical.    Coordination - The patient had normal movements in the hands and feet with no ataxia or dysmetria.  Tremor was absent.  Gait and Station - deferred.   ASSESSMENT/PLAN Mr. NYLE LIMB is a 73 y.o. male with history of HTN with intermittent compliance with medicines, former smoker, HLD, abnormal EKG with frequent PVCs and PACs, OSA presenting with sudden headache after working outside with word finding difficulty and right-sided weakness.   Migraine equivalent - less likely TIA  He had similar episode 12 years ago and resolved in one hour.   He had experienced visual migraine aura 3-4 times a year with lighting bulb in one or the other side of visual field lasting 10 min with nausea and needs to go dark room lie down. However, no or minimal HA during the episodes.   His daughter has similar visual aura.   CT head No acute abnormality.  CTA head & neck no LVO.  Mild atherosclerosis.  Mild PICA stenosis.  Moderate distal B VA stenosis.  MRI unremarkable  2D Echo EF 60 to 65% with no source of embolus.  Mild stenosis aortic valve.  EEG normal, no seizure  LDL 104  HgbA1c 5.6  aspirin 81 mg daily prior to admission, now  on aspirin 81 mg daily. Continue ASA on discharge.   Therapy recommendations: No therapy needs  Disposition: Return home  No neurologic follow-up  indicated  Hypertension  Stable . BP goal normotensive  Hyperlipidemia  Home meds: Pravachol 20, resumed in hospital  LDL 104, goal < 100 for non-stroke patients  Continue statin at discharge  Other Stroke Risk Factors  Advanced age  Former cigarette smoker, quit 31 years ago  ETOH use, advised to drink no more than 2 drink(s) a day  Obesity, Body mass index is 33.74 kg/m., recommend weight loss, diet and exercise as appropriate   Family hx stroke (paternal grandfather)  Obstructive sleep apnea, on CPAP at home  Other Active Problems  Cognitive deficit for past few years per daughter  Severe arthritis of bilateral knees making it difficult to walk, awaiting surgery, injections he is getting helps  Hospital day # 0  Neurology will sign off. Please call with questions. Pt will follow up with stroke clinic NP at [facility] in about 4 weeks. Thanks for the consult.  Rosalin Hawking, MD PhD Stroke Neurology 03/01/2019 12:55 PM   To contact Stroke Continuity provider, please refer to [email or web address] After hours, contact General Neurology

## 2019-03-01 NOTE — Evaluation (Signed)
Occupational Therapy Evaluation and Discharge Patient Details Name: Eric Stokes MRN: [id number] DOB: 14-Oct-1945 Today's Date: 03/01/2019    History of Present Illness Eric Stokes is an 73 y.o. male with past medical history significant for hypertension, previous tobacco use and OSA who was in his usual state of relatively good health until 11:00 today when he developed dizziness and confusion.   MRI negative   Clinical Impression   Admitted with above and presents to OT back to baseline.  Pt reports mild pain in Rt knee impacting ability to complete LB dressing, awaiting TKA.  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  Discussed stroke signs and symptoms "BE FAST".  Pt overall independent with ADLs and mobility, with no further acute OT needs at this time.  OT will sign off.    Follow Up Recommendations  No OT follow up;Supervision - Intermittent    Equipment Recommendations  None recommended by OT       Precautions / Restrictions Precautions Precautions: Fall Precaution Comments: due for a R TKA,awaiting [facility] to schedule      Mobility Bed Mobility Overal bed mobility: Modified Independent             General bed mobility comments: no difficulty, HOB flat  Transfers Overall transfer level: Modified independent Equipment used: None             General transfer comment: no difficulty or instability    Balance Overall balance assessment: Modified Independent;Mild deficits observed, not formally tested                                         ADL either performed or assessed with clinical judgement   ADL Overall ADL's : Independent;At baseline                                       General ADL Comments: Pt reports mild discomfort in Rt knee when completing LB dressing due to "bone on bone" awaiting TKA, overall Independent with dressing and toileting tasks     Vision Baseline Vision/History: Wears  glasses Wears Glasses: Reading only Patient Visual Report: No change from baseline Vision Assessment?: No apparent visual deficits            Pertinent Vitals/Pain Pain Assessment: No/denies pain     Hand Dominance Right   Extremity/Trunk Assessment Upper Extremity Assessment Upper Extremity Assessment: Overall WFL for tasks assessed   Lower Extremity Assessment Lower Extremity Assessment: RLE deficits/detail RLE Deficits / Details: generalized weakness due to severe "bone on bone" in right knee and awaiting TKA   Cervical / Trunk Assessment Cervical / Trunk Assessment: Normal   Communication Communication Communication: HOH   Cognition Arousal/Alertness: Awake/alert Behavior During Therapy: WFL for tasks assessed/performed Overall Cognitive Status: Within Functional Limits for tasks assessed                                 General Comments: recalled 3/3 words from mini mental exam even after 5 min recall   General Comments  pt able to toilet self and wash hands independently            Home Living Family/patient expects to be discharged to:: Private residence Living Arrangements: Spouse/significant other Available  Help at Discharge: Family;Available 24 hours/day Type of Home: House Home Access: Stairs to enter [employer] of Steps: 3 Entrance Stairs-Rails: Left Home Layout: One level     Bathroom Shower/Tub: Occupational psychologist: Handicapped height     Home Equipment: None      Lives With: Spouse    Prior Functioning/Environment Level of Independence: Independent        Comments: drives, works in Ship broker goals can be found in the care plan section) Acute Rehab OT Goals Patient Stated Goal: home today OT Goal Formulation: All assessment and education complete, DC therapy    AM-PAC OT "6 Clicks" Daily Activity     Outcome Measure Help from another person eating meals?:  None Help from another person taking care of personal grooming?: None Help from another person toileting, which includes using toliet, bedpan, or urinal?: None Help from another person bathing (including washing, rinsing, drying)?: None Help from another person to put on and taking off regular upper body clothing?: None Help from another person to put on and taking off regular lower body clothing?: None 6 Click Score: 24   End of Session Nurse Communication: Mobility status  Activity Tolerance: Patient tolerated treatment well Patient left: in bed;with call bell/phone within reach  OT Visit Diagnosis: Unsteadiness on feet (R26.81);Other abnormalities of gait and mobility (R26.89);Muscle weakness (generalized) (M62.81)                Time: 3817-7116 OT Time Calculation (min): 14 min Charges:  OT General Charges $OT Visit: 1 Visit OT Evaluation $OT Eval Low Complexity: [place], [place], [place] 03/01/2019, 12:53 PM

## 2019-03-01 NOTE — Evaluation (Signed)
Physical Therapy Evaluation and DISCHARGE Patient Details Name: Eric Stokes MRN: [id number] DOB: March 18, 1946 Today's Date: 03/01/2019   History of Present Illness  Eric Stokes is an 73 y.o. male with past medical history significant for hypertension, previous tobacco use and OSA who was in his usual state of relatively good health until 11:00 today when he developed dizziness and confusion.   MRI negative  Clinical Impression  Pt functioning at baseline. Pt with mild  RLE limp due to R knee arthritis and pain and is awaiting R TKA. Pt with good home set up and support. Pt educated on BE FAST, pt with good recall. Pt also able to remember 3/3 items on mini mental exam. Pt with no further acute PT needs at this time. PT SIGNING OFF. Please re-consult if needed in future.    Follow Up Recommendations No PT follow up;Supervision - Intermittent    Equipment Recommendations  None recommended by PT    Recommendations for Other Services       Precautions / Restrictions Precautions Precautions: Fall Precaution Comments: due for a R TKA,[facility]aiting [facility] Restrictions Weight Bearing Restrictions: No      Mobility  Bed Mobility Overal bed mobility: Modified Independent             General bed mobility comments: no difficulty, HOB flat  Transfers Overall transfer level: Modified independent Equipment used: None             General transfer comment: no difficulty or instability  Ambulation/Gait Ambulation/Gait assistance: Modified independent (Device/Increase time) Gait Distance (Feet): 200 Feet Assistive device: None   Gait velocity: wfl   General Gait Details: pt with mild R LE limp due to R knee pain, pt due for R TKA, no episodes of LOB. Pt able to step over items  Stairs Stairs: Yes Stairs assistance: Supervision Stair Management: One rail Left;Alternating pattern;Step to pattern Number of Stairs: 14 General stair comments: alternated  ascending, step to descending due to R knee pain with excessive knee bending  Wheelchair Mobility    Modified Rankin (Stroke Patients Only) Modified Rankin (Stroke Patients Only) Pre-Morbid Rankin Score: No significant disability Modified Rankin: No significant disability     Balance Overall balance assessment: Modified Independent;Mild deficits observed, not formally tested                                           Pertinent Vitals/Pain Pain Assessment: No/denies pain    Home Living Family/patient expects to be discharged to:: Private residence Living Arrangements: Spouse/significant other Available Help at Discharge: Family;Available 24 hours/day Type of Home: House Home Access: Stairs to enter Entrance Stairs-Rails: Left Entrance Stairs-Number of Steps: 3 Home Layout: One level Home Equipment: None      Prior Function Level of Independence: Independent         Comments: drives, works in [employer] Services operational officer   Dominant Hand: Right    Extremity/Trunk Assessment   Upper Extremity Assessment Upper Extremity Assessment: Overall WFL for tasks assessed    Lower Extremity Assessment Lower Extremity Assessment: RLE deficits/detail RLE Deficits / Details: generalized weakness due to severe "bone on bone" in right knee and awaiting TKA    Cervical / Trunk Assessment Cervical / Trunk Assessment: Normal  Communication   Communication: HOH(extremely)  Cognition Arousal/Alertness: Awake/alert Behavior During Therapy: WFL for tasks assessed/performed Overall Cognitive  Status: Within Functional Limits for tasks assessed                                        General Comments General comments (skin integrity, edema, etc.): pt able to toliet self and wash hand independently    Exercises     Assessment/Plan    PT Assessment Patent does not need any further PT services  PT Problem List         PT Treatment  Interventions      PT Goals (Current goals can be found in the Care Plan section)  Acute Rehab PT Goals Patient Stated Goal: home today PT Goal Formulation: All assessment and education complete, DC therapy    Frequency     Barriers to discharge        Co-evaluation               AM-PAC PT "6 Clicks" Mobility  Outcome Measure Help needed turning from your back to your side while in a flat bed without using bedrails?: None Help needed moving from lying on your back to sitting on the side of a flat bed without using bedrails?: None Help needed moving to and from a bed to a chair (including a wheelchair)?: None Help needed standing up from a chair using your arms (e.g., wheelchair or bedside chair)?: None Help needed to walk in hospital room?: None Help needed climbing 3-5 steps with a railing? : A Little 6 Click Score: 23    End of Session Equipment Utilized During Treatment: Gait belt Activity Tolerance: Patient tolerated treatment well Patient left: in chair;with call bell/phone within reach Nurse Communication: Mobility status PT Visit Diagnosis: Unsteadiness on feet (R26.81);Muscle weakness (generalized) (M62.81);Difficulty in walking, not elsewhere classified (R26.2)    Time: 7867-5449 PT Time Calculation (min) (ACUTE ONLY): 30 min   Charges:   PT Evaluation $PT Eval Moderate Complexity: 1 Mod PT Treatments $Gait Training: 8-22 mins        Kittie Plater, PT, DPT Acute Rehabilitation Services Pager #: [phone] Office #: [phone]   [place]line Lopes 03/01/2019, 9:06 AM

## 2019-03-01 NOTE — TOC Transition Note (Signed)
Transition of Care [facility]) - CM/SW Discharge Note   Patient Details  Name: Eric Stokes MRN: [id number] Date of Birth: 11-02-1945  [facility] CM/SW Contact:  Pollie Friar, RN Phone Number: 03/01/2019, 3:49 PM   Clinical Narrative:    Pt is discharging home with self care. Pt has PCP, insurance and transportation home.    Final next level of care: Home/Self Care Barriers to Discharge: No Barriers Identified   Patient Goals and CMS Choice        Discharge Placement                       Discharge Plan and Services                                     Social Determinants of Health (SDOH) Interventions     Readmission Risk Interventions No flowsheet data found.

## 2019-03-01 NOTE — Progress Notes (Signed)
  Echocardiogram 2D Echocardiogram has been performed.  Eric Stokes 03/01/2019, 8:36 AM

## 2019-03-01 NOTE — TOC Initial Note (Signed)
Transition of Care [facility]) - Initial/Assessment Note    Patient Details  Name: Eric Stokes MRN: [id number] Date of Birth: 1946/05/12  Transition of [facility]) CM/SW Contact:    Pollie Friar, RN Phone Number: 03/01/2019, 11:52 AM  Clinical Narrative:                 Pt denies any issues with home medications. Pt denies any transportation issues.  TOC following for d/c needs.   Expected Discharge Plan: Home/Self Care Barriers to Discharge: Continued Medical Work up   Patient Goals and CMS Choice        Expected Discharge Plan and Services Expected Discharge Plan: Home/Self Care       Living arrangements for the past 2 months: Single Family Home Expected Discharge Date: 03/01/19                                    Prior Living Arrangements/Services Living arrangements for the past 2 months: Single Family Home Lives with:: Spouse Patient language and need for interpreter reviewed:: Yes(no needs) Do you feel safe going back to the place where you live?: Yes      Need for Family Participation in Patient Care: Yes (Comment)(intermittent supervision) Care giver support system in place?: Yes (comment)(has 24 hour supervision at home)   Criminal Activity/Legal Involvement Pertinent to Current Situation/Hospitalization: No - Comment as needed  Activities of Daily Living Home Assistive Devices/Equipment: CPAP ADL Screening (condition at time of admission) Patient's cognitive ability adequate to safely complete daily activities?: Yes Is the patient deaf or have difficulty hearing?: Yes Does the patient have difficulty seeing, even when wearing glasses/contacts?: No Does the patient have difficulty concentrating, remembering, or making decisions?: No Patient able to express need for assistance with ADLs?: Yes Does the patient have difficulty dressing or bathing?: No Independently performs ADLs?: Yes (appropriate for developmental age) Does the patient have  difficulty walking or climbing stairs?: No Weakness of Legs: Right(due the arthritist need knee placement at a point) Weakness of Arms/Hands: None  Permission Sought/Granted                  Emotional Assessment Appearance:: Appears stated age Attitude/Demeanor/Rapport: Engaged Affect (typically observed): Accepting, Pleasant Orientation: : Oriented to Self, Oriented to Place, Oriented to  Time, Oriented to Situation   Psych Involvement: No (comment)  Admission diagnosis:  Transient confusion [R41.0] Hypertension, unspecified type [I10] Patient Active Problem List   Diagnosis Date Noted  . TIA (transient ischemic attack) 02/28/2019  . At risk for bradycardia 06/15/2018  . B12 deficiency, with monthly injections 06/15/2018  . Carotid bruit present 06/15/2018  . Primary osteoarthritis of right knee, followed by Orthopedics 04/12/2018  . Asthma, allergic, prn albuterol, triggered by perfumes 03/19/2018  . Former smoker, stopped smoking in distant past 03/15/2018  . OSA on CPAP 09/25/2015  . Elevated PSA 09/03/2012  . BPH (benign prostatic hyperplasia) 09/03/2012  . Hx of colonic polyps 09/03/2012  . Hematuria 09/16/2011  . Vitamin D deficiency 11/26/2010  . Allergic rhinitis, using Flonase 11/20/2010  . Hearing loss 11/20/2010  . Dyslipidemia, on Pravastatin 12/21/2008  . Benign essential hypertension 12/21/2008   PCP:  Briscoe Deutscher, DO Pharmacy:   [facility] [place], [place] - [address] [place] [address] Phone: [phone] Fax: [phone]     Social Determinants of Health (SDOH) Interventions    Readmission  Risk Interventions No flowsheet data found.

## 2019-03-01 NOTE — Discharge Summary (Signed)
Physician Discharge Summary  Eric Stokes ZOX:[id number] DOB: [birth year] DOA: 02/28/2019  PCP: Briscoe Deutscher, DO  Admit date: 02/28/2019 Discharge date: 03/01/2019  Admitted From: Home Disposition: Home  Recommendations for Outpatient Follow-up:  1. Follow up with PCP in 1 week 2. Follow up with [facility] in 4 weeks 3. Please obtain BMP/CBC in one week 4. Please follow up on the following pending results: None  Home Health: None Equipment/Devices: None  Discharge Condition: Stable CODE STATUS: Full code Diet recommendation: Heart healthy   Brief/Interim Summary:  Admission HPI written by Vashti Hey, MD   Eric Stokes is an 73 y.o. male with past medical history significant for hypertension, previous tobacco use and OSA who was in his usual state of relatively good health until 11:00 today when he developed dizziness and confusion.  Patient states that he was aware that he was confused.  He knew he was in the garden but was not sure why he was out there and had to think really hard to remember that he was picking green beans.  He walked back inside and he was unable to remember his children and grandchildren's name.  He did know his wife.  He apparently was speaking very slowly although he did not appear to have dysarthria or a aphasia per his daughter who is a Designer, jewellery.  Patient was noted to be relatively more hypertensive during this episode and patient was brought to the ED.  On route to ED patient symptoms entirely resolved and he was no longer confused.  Past medical history is notable for a UTI with associated urinary retention requiring Foley placement.  Foley was removed earlier today at urology office.  Patient is continued on doxycycline for UTI.    Hospital course:  Migraine Initial concern for TIA. MRI unremarkable. CTA head and neck with some mild PICA stenosis and moderate distal bilateral vertebral artery stenosis. EEG without  evidence of seizure activity. Transthoracic Echocardiogram without embolic source. Neurology was consulted and likely migraine equivalent rather than TIA. Still, recommending follow-up at stroke clinic.  Essential hypertension Continue losartan and amlodipine  BPH Continue Flomax  UTI Continue outpatient doxycycline  Hyperlipidemia Continue pravastatin  COPD Stable. Continue home regimen  Discharge Diagnoses:  Principal Problem:   TIA (transient ischemic attack) Active Problems:   Dyslipidemia, on Pravastatin   Benign essential hypertension   BPH (benign prostatic hyperplasia)   OSA on CPAP    Discharge Instructions  Discharge Instructions    Ambulatory referral to Neurology   Complete by: As directed    Follow up with stroke clinic NP (Jessica Vanschaick or Cecille Rubin, if both not available, consider Zachery Dauer, or Ahern) at [facility] in about 4 weeks. Thanks.   Increase activity slowly   Complete by: As directed      Allergies as of 03/01/2019      Reactions   Lipitor [atorvastatin] Other (See Comments)   Leg Cramps   Lisinopril Cough      Medication List    STOP taking these medications   albuterol (2.5 MG/3ML) 0.083% nebulizer solution Commonly known as: PROVENTIL   amLODipine 5 MG tablet Commonly known as: NORVASC   ibuprofen 200 MG tablet Commonly known as: ADVIL     TAKE these medications   aspirin 81 MG EC tablet Take 81 mg by mouth daily.   cetirizine 10 MG tablet Commonly known as: ZYRTEC Take 10 mg by mouth daily.   doxycycline 100 MG tablet Commonly known  as: VIBRA-TABS Take 1 tablet (100 mg total) by mouth 2 (two) times daily.   fluticasone 50 MCG/ACT nasal spray Commonly known as: FLONASE USE TWO SPRAYS IN EACH NOSTRIL DAILY AS NEEDED. What changed:   how much to take  how to take this  when to take this  additional instructions   fluticasone 50 MCG/BLIST diskus inhaler Commonly known as: Flovent Diskus Inhale 1 puff  into the lungs 2 (two) times daily.   latanoprost 0.005 % ophthalmic solution Commonly known as: XALATAN INSTILL 1 DROP INTO BOTH EYES AT BEDTIME What changed: when to take this   losartan 25 MG tablet Commonly known as: COZAAR TAKE 1 TABLET BY MOUTH EVERY DAY What changed: when to take this   montelukast 10 MG tablet Commonly known as: SINGULAIR Take 1 tablet (10 mg total) by mouth daily.   pravastatin 20 MG tablet Commonly known as: PRAVACHOL TAKE 1 TABLET (20 MG TOTAL) BY MOUTH 3 (THREE) TIMES A WEEK.   tamsulosin 0.4 MG Caps capsule Commonly known as: FLOMAX Take 0.4 mg by mouth daily.   Vitamin D3 50 MCG (2000 UT) Tabs Take 2,000 Int'l Units by mouth daily.      Follow-up Information    [facility]. Schedule an appointment as soon as possible for a visit in 4 week(s).   Specialty: Neurology Contact information: [address] [place] [phone]       Briscoe Deutscher, DO. Schedule an appointment as soon as possible for a visit in 1 week(s).   Specialty: Family Medicine Why: Hospital follow-up Contact information: [place] [address] [phone]          Allergies  Allergen Reactions  . Lipitor [Atorvastatin] Other (See Comments)    Leg Cramps  . Lisinopril Cough    Consultations:  Neurology/stroke   Procedures/Studies: Ct Angio Head W Or Wo Contrast  Result Date: 02/28/2019 CLINICAL DATA:  TIA EXAM: CT ANGIOGRAPHY HEAD AND NECK TECHNIQUE: Multidetector CT imaging of the head and neck was performed using the standard protocol during bolus administration of intravenous contrast. Multiplanar CT image reconstructions and MIPs were obtained to evaluate the vascular anatomy. Carotid stenosis measurements (when applicable) are obtained utilizing NASCET criteria, using the distal internal carotid diameter as the denominator. CONTRAST:  42mL OMNIPAQUE IOHEXOL 350 MG/ML SOLN  COMPARISON:  CT head today FINDINGS: CTA NECK FINDINGS Aortic arch: Standard branching. Imaged portion shows no evidence of aneurysm or dissection. No significant stenosis of the major arch vessel origins. Mild atherosclerotic disease in the aortic arch. Right carotid system: Mild atherosclerotic disease right carotid bifurcation without significant stenosis or irregularity Left carotid system: Mild atherosclerotic disease left bifurcation without significant stenosis or irregularity. Vertebral arteries: Left vertebral artery dominant. Mild to moderate stenosis left vertebral artery at the skull base. Moderate stenosis distal right vertebral artery at the skull base. Skeleton: Cervical spondylosis without acute skeletal abnormality. Other neck: Negative for mass or adenopathy. Upper chest: Negative Review of the MIP images confirms the above findings CTA HEAD FINDINGS Anterior circulation: Atherosclerotic calcification in the cavernous carotid bilaterally with mild stenosis bilaterally. Anterior and middle cerebral arteries patent bilaterally without stenosis or branch occlusion. Posterior circulation: Moderate stenosis distal vertebral artery bilaterally at the skull base. Right vertebral artery ends in PICA. Left vertebral artery supplies the basilar. PICA patent bilaterally. Basilar widely patent. Superior cerebellar and posterior cerebral arteries patent bilaterally without significant stenosis. Venous sinuses: Normal venous enhancement Anatomic variants: None Review  of the MIP images confirms the above findings IMPRESSION: Mild atherosclerotic disease in the carotid bifurcation without significant stenosis. Mild stenosis in the cavernous carotid bilaterally. Moderate stenosis distal vertebral artery bilaterally. Right vertebral artery ends in PICA. Negative for intracranial large vessel occlusion. Electronically Signed   By: Franchot Gallo M.D.   On: 02/28/2019 21:47   Ct Head Wo Contrast  Result Date:  02/28/2019 CLINICAL DATA:  Headache, dizziness EXAM: CT HEAD WITHOUT CONTRAST TECHNIQUE: Contiguous axial images were obtained from the base of the skull through the vertex without intravenous contrast. COMPARISON:  None. FINDINGS: Brain: No evidence of acute infarction, hemorrhage, hydrocephalus, extra-axial collection or mass lesion/mass effect. Vascular: No hyperdense vessel or unexpected calcification. Skull: Normal. Negative for fracture or focal lesion. Sinuses/Orbits: No acute finding. Other: None. IMPRESSION: No acute intracranial pathology. No non-contrast CT findings to explain headache or dizziness. Electronically Signed   By: Eddie Candle M.D.   On: 02/28/2019 16:47   Ct Angio Neck W Or Wo Contrast  Result Date: 02/28/2019 CLINICAL DATA:  TIA EXAM: CT ANGIOGRAPHY HEAD AND NECK TECHNIQUE: Multidetector CT imaging of the head and neck was performed using the standard protocol during bolus administration of intravenous contrast. Multiplanar CT image reconstructions and MIPs were obtained to evaluate the vascular anatomy. Carotid stenosis measurements (when applicable) are obtained utilizing NASCET criteria, using the distal internal carotid diameter as the denominator. CONTRAST:  71mL OMNIPAQUE IOHEXOL 350 MG/ML SOLN COMPARISON:  CT head today FINDINGS: CTA NECK FINDINGS Aortic arch: Standard branching. Imaged portion shows no evidence of aneurysm or dissection. No significant stenosis of the major arch vessel origins. Mild atherosclerotic disease in the aortic arch. Right carotid system: Mild atherosclerotic disease right carotid bifurcation without significant stenosis or irregularity Left carotid system: Mild atherosclerotic disease left bifurcation without significant stenosis or irregularity. Vertebral arteries: Left vertebral artery dominant. Mild to moderate stenosis left vertebral artery at the skull base. Moderate stenosis distal right vertebral artery at the skull base. Skeleton: Cervical  spondylosis without acute skeletal abnormality. Other neck: Negative for mass or adenopathy. Upper chest: Negative Review of the MIP images confirms the above findings CTA HEAD FINDINGS Anterior circulation: Atherosclerotic calcification in the cavernous carotid bilaterally with mild stenosis bilaterally. Anterior and middle cerebral arteries patent bilaterally without stenosis or branch occlusion. Posterior circulation: Moderate stenosis distal vertebral artery bilaterally at the skull base. Right vertebral artery ends in PICA. Left vertebral artery supplies the basilar. PICA patent bilaterally. Basilar widely patent. Superior cerebellar and posterior cerebral arteries patent bilaterally without significant stenosis. Venous sinuses: Normal venous enhancement Anatomic variants: None Review of the MIP images confirms the above findings IMPRESSION: Mild atherosclerotic disease in the carotid bifurcation without significant stenosis. Mild stenosis in the cavernous carotid bilaterally. Moderate stenosis distal vertebral artery bilaterally. Right vertebral artery ends in PICA. Negative for intracranial large vessel occlusion. Electronically Signed   By: Franchot Gallo M.D.   On: 02/28/2019 21:47   Mr Brain Wo Contrast  Result Date: 03/01/2019 CLINICAL DATA:  73 year old male with episode of confusion and word-finding difficulty. Headache. EXAM: MRI HEAD WITHOUT CONTRAST TECHNIQUE: Multiplanar, multiecho pulse sequences of the brain and surrounding structures were obtained without intravenous contrast. COMPARISON:  CTA head and neck 02/28/2019. FINDINGS: Brain: No restricted diffusion to suggest acute infarction. No midline shift, mass effect, evidence of mass lesion, ventriculomegaly, extra-axial collection or acute intracranial hemorrhage. Cervicomedullary junction and pituitary are within normal limits. Pearline Cables and white matter signal is within normal limits for age throughout the  brain. No cortical encephalomalacia.  No chronic cerebral blood products. Vascular: Major intracranial vascular flow voids are preserved. Skull and upper cervical spine: Negative visible cervical spine. Visualized bone marrow signal is within normal limits. Sinuses/Orbits: Negative orbits. Mild maxillary sinus mucosal thickening. Other: Mastoids are clear. Visible internal auditory structures appear normal. Scalp and face soft tissues appear negative. IMPRESSION: Normal for age noncontrast MRI appearance of the brain. Electronically Signed   By: Genevie Ann M.D.   On: 03/01/2019 02:29     Transthoracic Echocardiogram (03/01/2019) IMPRESSIONS    1. The left ventricle has normal systolic function with an ejection fraction of 60-65%. The cavity size was normal. There is moderately increased left ventricular wall thickness. Left ventricular diastolic parameters were normal. No evidence of left  ventricular regional wall motion abnormalities.  2. The right ventricle has normal systolic function. The cavity was normal. There is no increase in right ventricular wall thickness.  3. The mitral valve is abnormal. Mild thickening of the mitral valve leaflet. There is mild mitral annular calcification present.  4. The tricuspid valve is grossly normal.  5. The aortic valve is abnormal. Moderate calcification of the aortic valve. Mild stenosis of the aortic valve.   Subjective: No issues overnight  Discharge Exam: Vitals:   03/01/19 0830 03/01/19 1207  BP: 133/77 (!) 145/87  Pulse: 62 (!) 55  Resp: 20 20  Temp: 98 F (36.7 C) 97.9 F (36.6 C)  SpO2: 99% 99%   Vitals:   03/01/19 0300 03/01/19 0500 03/01/19 0830 03/01/19 1207  BP:  133/89 133/77 (!) 145/87  Pulse:  (!) 50 62 (!) 55  Resp:  18 20 20   Temp:  97.6 F (36.4 C) 98 F (36.7 C) 97.9 F (36.6 C)  TempSrc:  Oral Oral Oral  SpO2:  99% 99% 99%  Weight: 108.2 kg     Height: 5' 10.5" (1.791 m)       General: Pt is alert, awake, not in acute distress Cardiovascular: RRR,  S1/S2 +, no rubs, no gallops Respiratory: CTA bilaterally, no wheezing, no rhonchi Abdominal: Soft, NT, ND, bowel sounds + Extremities: no edema, no cyanosis    The results of significant diagnostics from this hospitalization (including imaging, microbiology, ancillary and laboratory) are listed below for reference.     Microbiology: Recent Results (from the past 240 hour(s))  SARS Coronavirus 2 (CEPHEID - Performed in [facility] hospital lab), Hosp Order     Status: None   Collection Time: 02/28/19  5:29 PM   Specimen: Nasopharyngeal Swab  Result Value Ref Range Status   SARS Coronavirus 2 NEGATIVE NEGATIVE Final    Comment: (NOTE) If result is NEGATIVE SARS-CoV-2 target nucleic acids are NOT DETECTED. The SARS-CoV-2 RNA is generally detectable in upper and lower  respiratory specimens during the acute phase of infection. The lowest  concentration of SARS-CoV-2 viral copies this assay can detect is 250  copies / mL. A negative result does not preclude SARS-CoV-2 infection  and should not be used as the sole basis for treatment or other  patient management decisions.  A negative result may occur with  improper specimen collection / handling, submission of specimen other  than nasopharyngeal swab, presence of viral mutation(s) within the  areas targeted by this assay, and inadequate number of viral copies  (<250 copies / mL). A negative result must be combined with clinical  observations, patient history, and epidemiological information. If result is POSITIVE SARS-CoV-2 target nucleic acids are DETECTED. The SARS-CoV-2  RNA is generally detectable in upper and lower  respiratory specimens dur ing the acute phase of infection.  Positive  results are indicative of active infection with SARS-CoV-2.  Clinical  correlation with patient history and other diagnostic information is  necessary to determine patient infection status.  Positive results do  not rule out bacterial infection  or co-infection with other viruses. If result is PRESUMPTIVE POSTIVE SARS-CoV-2 nucleic acids MAY BE PRESENT.   A presumptive positive result was obtained on the submitted specimen  and confirmed on repeat testing.  While 2019 novel coronavirus  (SARS-CoV-2) nucleic acids may be present in the submitted sample  additional confirmatory testing may be necessary for epidemiological  and / or clinical management purposes  to differentiate between  SARS-CoV-2 and other Sarbecovirus currently known to infect humans.  If clinically indicated additional testing with an alternate test  methodology [phone]) is advised. The SARS-CoV-2 RNA is generally  detectable in upper and lower respiratory sp ecimens during the acute  phase of infection. The expected result is Negative. Fact Sheet for Patients:  StrictlyIdeas.no Fact Sheet for Healthcare Providers: BankingDealers.co.za This test is not yet approved or cleared by the [place] FDA and has been authorized for detection and/or diagnosis of SARS-CoV-2 by FDA under an Emergency Use Authorization (EUA).  This EUA will remain in effect (meaning this test can be used) for the duration of the COVID-19 declaration under Section 564(b)(1) of the Act, 21 U.S.C. section 360bbb-3(b)(1), unless the authorization is terminated or revoked sooner. Performed at [facility], [place] [address], [place], [place] [address]      Labs: BNP (last 3 results) No results for input(s): BNP in the last 8760 hours. Basic Metabolic Panel: Recent Labs  Lab 02/28/19 1353  NA 136  K 3.9  CL 103  CO2 22  GLUCOSE 104*  BUN 13  CREATININE 0.82  CALCIUM 9.0   Liver Function Tests: No results for input(s): AST, ALT, ALKPHOS, BILITOT, PROT, ALBUMIN in the last 168 hours. No results for input(s): LIPASE, AMYLASE in the last 168 hours. No results for input(s): AMMONIA in the last 168 hours. CBC: Recent Labs   Lab 02/28/19 1353  WBC 6.0  HGB 13.9  HCT 40.2  MCV 88.4  PLT 224   Cardiac Enzymes: No results for input(s): CKTOTAL, CKMB, CKMBINDEX, TROPONINI in the last 168 hours. BNP: Invalid input(s): POCBNP CBG: Recent Labs  Lab 02/28/19 1516  GLUCAP 91   D-Dimer No results for input(s): DDIMER in the last 72 hours. Hgb A1c Recent Labs    03/01/19 0449  HGBA1C 5.6   Lipid Profile Recent Labs    03/01/19 0449  CHOL 161  HDL 38*  LDLCALC 104*  TRIG 94  CHOLHDL 4.2   Thyroid function studies No results for input(s): TSH, T4TOTAL, T3FREE, THYROIDAB in the last 72 hours.  Invalid input(s): FREET3 Anemia work up No results for input(s): VITAMINB12, FOLATE, FERRITIN, TIBC, IRON, RETICCTPCT in the last 72 hours. Urinalysis    Component Value Date/Time   COLORURINE YELLOW 02/28/2019 1527   APPEARANCEUR HAZY (A) 02/28/2019 1527   LABSPEC 1.008 02/28/2019 1527   PHURINE 5.0 02/28/2019 1527   GLUCOSEU NEGATIVE 02/28/2019 1527   HGBUR MODERATE (A) 02/28/2019 1527   BILIRUBINUR NEGATIVE 02/28/2019 1527   BILIRUBINUR n 09/18/2015 0904   KETONESUR NEGATIVE 02/28/2019 1527   PROTEINUR NEGATIVE 02/28/2019 1527   UROBILINOGEN 0.2 09/18/2015 0904   NITRITE NEGATIVE 02/28/2019 1527   LEUKOCYTESUR MODERATE (A) 02/28/2019  32   Sepsis Labs Invalid input(s): PROCALCITONIN,  WBC,  LACTICIDVEN Microbiology Recent Results (from the past 240 hour(s))  SARS Coronavirus 2 (CEPHEID - Performed in [facility] hospital lab), Hosp Order     Status: None   Collection Time: 02/28/19  5:29 PM   Specimen: Nasopharyngeal Swab  Result Value Ref Range Status   SARS Coronavirus 2 NEGATIVE NEGATIVE Final    Comment: (NOTE) If result is NEGATIVE SARS-CoV-2 target nucleic acids are NOT DETECTED. The SARS-CoV-2 RNA is generally detectable in upper and lower  respiratory specimens during the acute phase of infection. The lowest  concentration of SARS-CoV-2 viral copies this assay can detect is  250  copies / mL. A negative result does not preclude SARS-CoV-2 infection  and should not be used as the sole basis for treatment or other  patient management decisions.  A negative result may occur with  improper specimen collection / handling, submission of specimen other  than nasopharyngeal swab, presence of viral mutation(s) within the  areas targeted by this assay, and inadequate number of viral copies  (<250 copies / mL). A negative result must be combined with clinical  observations, patient history, and epidemiological information. If result is POSITIVE SARS-CoV-2 target nucleic acids are DETECTED. The SARS-CoV-2 RNA is generally detectable in upper and lower  respiratory specimens dur ing the acute phase of infection.  Positive  results are indicative of active infection with SARS-CoV-2.  Clinical  correlation with patient history and other diagnostic information is  necessary to determine patient infection status.  Positive results do  not rule out bacterial infection or co-infection with other viruses. If result is PRESUMPTIVE POSTIVE SARS-CoV-2 nucleic acids MAY BE PRESENT.   A presumptive positive result was obtained on the submitted specimen  and confirmed on repeat testing.  While 2019 novel coronavirus  (SARS-CoV-2) nucleic acids may be present in the submitted sample  additional confirmatory testing may be necessary for epidemiological  and / or clinical management purposes  to differentiate between  SARS-CoV-2 and other Sarbecovirus currently known to infect humans.  If clinically indicated additional testing with an alternate test  methodology [phone]) is advised. The SARS-CoV-2 RNA is generally  detectable in upper and lower respiratory sp ecimens during the acute  phase of infection. The expected result is Negative. Fact Sheet for Patients:  StrictlyIdeas.no Fact Sheet for Healthcare  Providers: BankingDealers.co.za This test is not yet approved or cleared by the [place] FDA and has been authorized for detection and/or diagnosis of SARS-CoV-2 by FDA under an Emergency Use Authorization (EUA).  This EUA will remain in effect (meaning this test can be used) for the duration of the COVID-19 declaration under Section 564(b)(1) of the Act, 21 U.S.C. section 360bbb-3(b)(1), unless the authorization is terminated or revoked sooner. Performed at [facility], [place] [address], [place], [place] [address]     SIGNED:   Cordelia Poche, MD [facility] 03/01/2019, 3:18 PM

## 2019-03-01 NOTE — Plan of Care (Signed)
  Problem: Education: Goal: Knowledge of secondary prevention will improve Outcome: Progressing   Problem: Education: Goal: Knowledge of patient specific risk factors addressed and post discharge goals established will improve Outcome: Progressing   Problem: Education: Goal: Individualized Educational Video(s) Outcome: Progressing   Problem: Education: Goal: Knowledge of secondary prevention will improve Outcome: Progressing

## 2019-03-01 NOTE — Care Management Obs Status (Signed)
[facility] NOTIFICATION   Patient Details  Name: Eric Stokes MRN: [id number] Date of Birth: 1946/06/22   Medicare Observation Status Notification Given:  Yes    Pollie Friar, RN 03/01/2019, 3:46 PM

## 2019-03-01 NOTE — Procedures (Signed)
ELECTROENCEPHALOGRAM REPORT   Patient: Eric Stokes       Room #: 8M57Q EEG No. ID: [id number] Age: 73 y.o.        Sex: male Referring Physician: Lonny Prude Report Date:  03/01/2019        Interpreting Physician: Alexis Goodell  History: [name] is an 73 y.o. male with an episode of transient AMS  Medications:  ASA, Doxycycline, Singulair, Claritin, Pravachol, Flomax  Conditions of Recording:  This is a 21 channel routine scalp EEG performed with bipolar and monopolar montages arranged in accordance to the international 10/20 system of electrode placement. One channel was dedicated to EKG recording.  The patient is in the awake state.  Description:  The waking background activity consists of a low voltage, symmetrical, fairly well organized, 9 Hz alpha activity, seen from the parieto-occipital and posterior temporal regions.  Low voltage fast activity, poorly organized, is seen anteriorly and is at times superimposed on more posterior regions.  A mixture of theta and alpha rhythms are seen from the central and temporal regions. The patient does not drowse or sleep. No epileptiform activity is noted.   Hyperventilation was not performed.  Intermittent photic stimulation was performed and elicits a symmetrical driving response but fails to elicit any abnormalities.  IMPRESSION: This is a normal awake electroencephalogram with activation procedures. There are no focal lateralizing or epileptiform features.   Alexis Goodell, MD Neurology [phone] 03/01/2019, 11:28 AM

## 2019-03-01 NOTE — Progress Notes (Signed)
EEG complete - results pending 

## 2019-03-21 DIAGNOSIS — R3914 Feeling of incomplete bladder emptying: Secondary | ICD-10-CM | POA: Diagnosis not present

## 2019-03-21 DIAGNOSIS — R350 Frequency of micturition: Secondary | ICD-10-CM | POA: Diagnosis not present

## 2019-04-05 ENCOUNTER — Ambulatory Visit (INDEPENDENT_AMBULATORY_CARE_PROVIDER_SITE_OTHER): Payer: Medicare Other | Admitting: Family Medicine

## 2019-04-05 ENCOUNTER — Other Ambulatory Visit: Payer: Self-pay

## 2019-04-05 ENCOUNTER — Encounter: Payer: Self-pay | Admitting: Family Medicine

## 2019-04-05 VITALS — BP 150/70 | HR 78 | Temp 98.7°F | Ht 70.5 in | Wt 244.0 lb

## 2019-04-05 DIAGNOSIS — E669 Obesity, unspecified: Secondary | ICD-10-CM

## 2019-04-05 DIAGNOSIS — R5383 Other fatigue: Secondary | ICD-10-CM

## 2019-04-05 DIAGNOSIS — R972 Elevated prostate specific antigen [PSA]: Secondary | ICD-10-CM

## 2019-04-05 DIAGNOSIS — I10 Essential (primary) hypertension: Secondary | ICD-10-CM | POA: Diagnosis not present

## 2019-04-05 DIAGNOSIS — E785 Hyperlipidemia, unspecified: Secondary | ICD-10-CM | POA: Diagnosis not present

## 2019-04-05 DIAGNOSIS — E538 Deficiency of other specified B group vitamins: Secondary | ICD-10-CM

## 2019-04-05 DIAGNOSIS — G4733 Obstructive sleep apnea (adult) (pediatric): Secondary | ICD-10-CM

## 2019-04-05 DIAGNOSIS — Z9989 Dependence on other enabling machines and devices: Secondary | ICD-10-CM

## 2019-04-05 DIAGNOSIS — Z9189 Other specified personal risk factors, not elsewhere classified: Secondary | ICD-10-CM | POA: Diagnosis not present

## 2019-04-05 NOTE — Progress Notes (Signed)
Eric Stokes is a 73 y.o. male is here for follow up.  History of Present Illness:   HPI: Patient following up today after hospital visit from 02/28/2019 to 03/01/2019.  Past medical history significant for hypertension, OSA, and previous tobacco use developed dizziness confusion.  He had word finding difficulty and slurred speech.  His daughter is a Designer, jewellery.  Blood pressure was found to be up during this time.  En route to the emergency department the patient's symptoms resolved.  Initial concern for TIA with unremarkable MRI, CTA, EEG, TTE.  Patient states that he had UTI that was secondary to urinary retention requiring Foley placement.  He had been awake for about 4 days and was on doxycycline. He had trouble with Flomax causing hypotension and tachycardia.  Known enlarged prostate.  Nocturia x3 while on Flomax.  Followed by urology.  Currently on uroxatrol.  Health Maintenance Due  Topic Date Due  . COLONOSCOPY  06/12/1995  . INFLUENZA VACCINE  03/19/2019   Depression screen [facility] 2/9 07/28/2018 03/15/2018 12/16/2016  Decreased Interest 1 0 0  Down, Depressed, Hopeless 0 0 0  PHQ - 2 Score 1 0 0  Altered sleeping 3 3 -  Tired, decreased energy 3 3 -  Change in appetite 1 1 -  Feeling bad or failure about yourself  0 0 -  Trouble concentrating 0 0 -  Moving slowly or fidgety/restless 0 0 -  Suicidal thoughts 0 0 -  PHQ-9 Score 8 7 -  Difficult doing work/chores Not difficult at all Not difficult at all -   PMHx, SurgHx, SocialHx, FamHx, Medications, and Allergies were reviewed in the Visit Navigator and updated as appropriate.   Patient Active Problem List   Diagnosis Date Noted  . Obesity (BMI 30.0-34.9) 04/08/2019  . TIA (transient ischemic attack) 02/28/2019  . At risk for bradycardia 06/15/2018  . B12 deficiency, with monthly injections 06/15/2018  . Carotid bruit present 06/15/2018  . Primary osteoarthritis of right knee, followed by Orthopedics 04/12/2018  .  Asthma, allergic, prn albuterol, triggered by perfumes 03/19/2018  . Former smoker, stopped smoking in distant past 03/15/2018  . OSA on CPAP 09/25/2015  . Elevated PSA 09/03/2012  . Hyperplasia of prostate with lower urinary tract symptoms (LUTS) 09/03/2012  . Hx of colonic polyps 09/03/2012  . Hematuria 09/16/2011  . Vitamin D deficiency 11/26/2010  . Allergic rhinitis, using Flonase 11/20/2010  . Hearing loss 11/20/2010  . Dyslipidemia, on Pravastatin 12/21/2008  . Benign essential hypertension 12/21/2008   Social History   Tobacco Use  . Smoking status: Former Smoker    Types: Cigarettes    Quit date: 08/18/1965    Years since quitting: 53.6  . Smokeless tobacco: Never Used  . Tobacco comment: pt quit smoking 53 yrs ago  Substance Use Topics  . Alcohol use: Yes    Alcohol/week: 2.0 - 6.0 standard drinks    Types: 2 - 6 Cans of beer per week  . Drug use: No   Current Medications and Allergies   Current Outpatient Medications:  .  alfuzosin (UROXATRAL) 10 MG 24 hr tablet, Take 10 mg by mouth daily with breakfast., Disp: , Rfl:  .  aspirin 81 MG EC tablet, Take 81 mg by mouth daily. , Disp: , Rfl:  .  Cholecalciferol (VITAMIN D3) 2000 UNITS TABS, Take 2,000 Int'l Units by mouth daily. , Disp: , Rfl:  .  fluticasone (FLONASE) 50 MCG/ACT nasal spray, USE TWO SPRAYS IN EACH NOSTRIL  DAILY AS NEEDED. (Patient taking differently: Place 2 sprays into both nostrils daily. ), Disp: 48 g, Rfl: 1 .  latanoprost (XALATAN) 0.005 % ophthalmic solution, INSTILL 1 DROP INTO BOTH EYES AT BEDTIME (Patient taking differently: Place 1 drop into both eyes daily. ), Disp: 7.5 mL, Rfl: 5 .  losartan (COZAAR) 25 MG tablet, TAKE 1 TABLET BY MOUTH EVERY DAY (Patient taking differently: Take 25 mg by mouth at bedtime. ), Disp: 90 tablet, Rfl: 0 .  montelukast (SINGULAIR) 10 MG tablet, Take 1 tablet (10 mg total) by mouth daily., Disp: 90 tablet, Rfl: 1 .  pravastatin (PRAVACHOL) 20 MG tablet, TAKE 1 TABLET  (20 MG TOTAL) BY MOUTH 3 (THREE) TIMES A WEEK., Disp: 36 tablet, Rfl: 1   Allergies  Allergen Reactions  . Lipitor [Atorvastatin] Other (See Comments)    Leg Cramps  . Lisinopril Cough   Review of Systems   Pertinent items are noted in the HPI. Otherwise, a complete ROS is negative.  Vitals   Vitals:   04/05/19 1433  BP: (!) 150/70  Pulse: 78  Temp: 98.7 F (37.1 C)  TempSrc: Temporal  SpO2: 97%  Weight: 244 lb (110.7 kg)  Height: 5' 10.5" (1.791 m)     Body mass index is 34.52 kg/m.  Physical Exam   Physical Exam Vitals signs and nursing note reviewed.  Constitutional:      General: He is not in acute distress.    Appearance: He is well-developed.  HENT:     Head: Normocephalic and atraumatic.     Right Ear: External ear normal.     Left Ear: External ear normal.     Nose: Nose normal.  Eyes:     Conjunctiva/sclera: Conjunctivae normal.     Pupils: Pupils are equal, round, and reactive to light.  Neck:     Musculoskeletal: Neck supple.  Cardiovascular:     Rate and Rhythm: Normal rate and regular rhythm.  Pulmonary:     Effort: Pulmonary effort is normal.  Abdominal:     General: Bowel sounds are normal.     Palpations: Abdomen is soft.  Musculoskeletal: Normal range of motion.  Skin:    General: Skin is warm.  Neurological:     Mental Status: He is alert.  Psychiatric:        Behavior: Behavior normal.    Assessment and Plan   Eric Stokes was seen today for hospitalization follow-up.  Diagnoses and all orders for this visit:  Fatigue, unspecified type -     CBC with Differential/Platelet -     Comprehensive metabolic panel -     Magnesium -     TSH -     T4, free -     Vitamin B12 -     Iron, TIBC and Ferritin Panel  Elevated PSA Comments: Followed by urology.  He does not remember his last PSA so will add to labs today.  Recent urine recheck was clear. Orders: -     PSA  Dyslipidemia, on Pravastatin  Benign essential  hypertension Comments: Patient to monitor blood pressure carefully.  He was given a log.  He will send this in within 2 weeks.  At risk for bradycardia  B12 deficiency, with monthly injections Comments: Previously on monthly injections but stopped them.  Will recheck B12 and get him started again.  OSA on CPAP Comments: Compliant with CPAP.  Obesity (BMI 30.0-34.9)    . Orders and follow up as documented in [place], reviewed diet,  exercise and weight control, cardiovascular risk and specific lipid/LDL goals reviewed, reviewed medications and side effects in detail.  . Reviewed expectations re: course of current medical issues. . Outlined signs and symptoms indicating need for more acute intervention. . Patient verbalized understanding and all questions were answered. . Patient received an After Visit Summary.  Briscoe Deutscher, DO [place], [facility] 04/08/2019

## 2019-04-06 LAB — CBC WITH DIFFERENTIAL/PLATELET
Basophils Absolute: 0.1 10*3/uL (ref 0.0–0.1)
Basophils Relative: 1.2 % (ref 0.0–3.0)
Eosinophils Absolute: 0.1 10*3/uL (ref 0.0–0.7)
Eosinophils Relative: 1.4 % (ref 0.0–5.0)
HCT: 39.2 % (ref 39.0–52.0)
Hemoglobin: 13.3 g/dL (ref 13.0–17.0)
Lymphocytes Relative: 27.2 % (ref 12.0–46.0)
Lymphs Abs: 1.6 10*3/uL (ref 0.7–4.0)
MCHC: 33.8 g/dL (ref 30.0–36.0)
MCV: 90.1 fl (ref 78.0–100.0)
Monocytes Absolute: 0.4 10*3/uL (ref 0.1–1.0)
Monocytes Relative: 7.3 % (ref 3.0–12.0)
Neutro Abs: 3.8 10*3/uL (ref 1.4–7.7)
Neutrophils Relative %: 62.9 % (ref 43.0–77.0)
Platelets: 204 10*3/uL (ref 150.0–400.0)
RBC: 4.35 Mil/uL (ref 4.22–5.81)
RDW: 14 % (ref 11.5–15.5)
WBC: 6 10*3/uL (ref 4.0–10.5)

## 2019-04-06 LAB — COMPREHENSIVE METABOLIC PANEL
ALT: 18 U/L (ref 0–53)
AST: 19 U/L (ref 0–37)
Albumin: 4.3 g/dL (ref 3.5–5.2)
Alkaline Phosphatase: 48 U/L (ref 39–117)
BUN: 15 mg/dL (ref 6–23)
CO2: 26 mEq/L (ref 19–32)
Calcium: 9.3 mg/dL (ref 8.4–10.5)
Chloride: 106 mEq/L (ref 96–112)
Creatinine, Ser: 0.88 mg/dL (ref 0.40–1.50)
GFR: 84.7 mL/min (ref >60.00)
Glucose, Bld: 90 mg/dL (ref 70–99)
Potassium: 3.6 mEq/L (ref 3.5–5.1)
Sodium: 140 mEq/L (ref 135–145)
Total Bilirubin: 0.9 mg/dL (ref 0.2–1.2)
Total Protein: 7 g/dL (ref 6.0–8.3)

## 2019-04-06 LAB — MAGNESIUM: Magnesium: 2.1 mg/dL (ref 1.5–2.5)

## 2019-04-06 LAB — TSH: TSH: 1.71 u[IU]/mL (ref 0.35–4.50)

## 2019-04-06 LAB — VITAMIN B12: Vitamin B-12: 192 pg/mL — ABNORMAL LOW (ref 211–911)

## 2019-04-06 LAB — IRON,TIBC AND FERRITIN PANEL
%SAT: 36 % (calc) (ref 20–48)
Ferritin: 368 ng/mL (ref 24–380)
Iron: 111 ug/dL (ref 50–180)
TIBC: 307 mcg/dL (calc) (ref 250–425)

## 2019-04-06 LAB — PSA: PSA: 6.73 ng/mL — ABNORMAL HIGH (ref 0.10–4.00)

## 2019-04-06 LAB — T4, FREE: Free T4: 0.83 ng/dL (ref 0.60–1.60)

## 2019-04-08 ENCOUNTER — Other Ambulatory Visit: Payer: Self-pay | Admitting: Family Medicine

## 2019-04-08 DIAGNOSIS — E66811 Obesity, class 1: Secondary | ICD-10-CM | POA: Insufficient documentation

## 2019-04-08 DIAGNOSIS — J301 Allergic rhinitis due to pollen: Secondary | ICD-10-CM

## 2019-04-08 DIAGNOSIS — E669 Obesity, unspecified: Secondary | ICD-10-CM | POA: Insufficient documentation

## 2019-04-08 NOTE — Telephone Encounter (Signed)
Last fill 10/19/18  #90/1 Last OV 04/05/19 Next OV 05/06/19

## 2019-04-11 ENCOUNTER — Other Ambulatory Visit: Payer: Self-pay | Admitting: Family Medicine

## 2019-04-11 DIAGNOSIS — E785 Hyperlipidemia, unspecified: Secondary | ICD-10-CM

## 2019-04-12 ENCOUNTER — Encounter: Payer: Self-pay | Admitting: Adult Health

## 2019-04-12 ENCOUNTER — Ambulatory Visit (INDEPENDENT_AMBULATORY_CARE_PROVIDER_SITE_OTHER): Payer: Medicare Other | Admitting: Adult Health

## 2019-04-12 ENCOUNTER — Other Ambulatory Visit: Payer: Self-pay

## 2019-04-12 VITALS — BP 138/72 | HR 80 | Temp 98.0°F | Ht 70.5 in | Wt 248.8 lb

## 2019-04-12 DIAGNOSIS — Z9989 Dependence on other enabling machines and devices: Secondary | ICD-10-CM

## 2019-04-12 DIAGNOSIS — I10 Essential (primary) hypertension: Secondary | ICD-10-CM

## 2019-04-12 DIAGNOSIS — G4733 Obstructive sleep apnea (adult) (pediatric): Secondary | ICD-10-CM | POA: Diagnosis not present

## 2019-04-12 DIAGNOSIS — G43109 Migraine with aura, not intractable, without status migrainosus: Secondary | ICD-10-CM

## 2019-04-12 DIAGNOSIS — E785 Hyperlipidemia, unspecified: Secondary | ICD-10-CM

## 2019-04-12 NOTE — Progress Notes (Signed)
I agree with the above plan 

## 2019-04-12 NOTE — Patient Instructions (Signed)
Continue aspirin 81 mg daily  and pravastatin for secondary stroke prevention  Continue to follow up with PCP regarding cholesterol and blood pressure management   Ongoing use of CPAP for OSA management and ensure follow-up to ensure adequate management  Continue to monitor blood pressure at home  Maintain strict control of hypertension with blood pressure goal below 130/90, diabetes with hemoglobin A1c goal below 6.5% and cholesterol with LDL cholesterol (bad cholesterol) goal below 70 mg/dL. I also advised the patient to eat a healthy diet with plenty of whole grains, cereals, fruits and vegetables, exercise regularly and maintain ideal body weight.         Thank you for coming to see [place] at [facility]. I hope we have been able to provide you high quality care today.  You may receive a patient satisfaction survey over the next few weeks. We would appreciate your feedback and comments so that we may continue to improve ourselves and the health of our patients.

## 2019-04-12 NOTE — Progress Notes (Signed)
[facility] [address] [place]. [place] [address] [phone]       HOSPITAL FOLLOW UP NOTE  [name] Date of Birth:  December 09, 1945 Medical Record Number:  [id number]   Reason for Referral:  hospital follow up    CHIEF COMPLAINT:  Chief Complaint  Patient presents with   Follow-up    TIA f/u. Alone. Rm 5. No new concerns at this time.     HPI: Eric Stokes being seen today for in office hospital follow-up regarding likely migraine equivalent less likely TIA on 02/28/2019.  History obtained from patient and chart review. Reviewed all radiology images and labs personally.  Eric Stokes is a 73 y.o. male with history of HTN with intermittent compliance with medicines, former smoker, HLD, abnormal EKG with frequent PVCs and PACs, OSA  presented to [facility] ED on 02/28/2019 with sudden headache after working outside with word finding difficulty and right-sided weakness.   Neurology consulted with stroke work-up unremarkable.  Per history view, similar episode 12 years prior and resolved in about 1 hour.  He has also experienced visual migraine aura 3-4 times a year lasting for approximately 10 minutes with nausea but would have minimal or no headache following episode.  Felt as though likely symptoms related to migraine equivalent and less likely TIA.  CTA head/neck showed mild arthrosclerosis, mild PICA stenosis and moderate distal bilateral VA stenosis.  2D echo normal EF without cardiac source of embolus mild stenosis aortic valve.  EEG normal without evidence of seizures.  Recommended continue aspirin 81 mg at discharge.  HTN stable.  LDL 104 with LDL goal less than 100 for non-stroke patients and recommended continuation of pravastatin 20 mg daily.  Other stroke risk factors include advanced age, former tobacco use, EtOH use, obesity, family history of stroke, OSA on CPAP at home but no prior history of stroke.  Other active problems cognitive  deficit for past few weeks per daughter and severe arthritis of knees bilaterally.  He was discharged home in stable condition without therapy needs with resolution of symptoms.  He has been doing well since hospital discharge without recurring symptoms or migrainous symptoms.  He questions possible relation of medication use to his symptoms as Flomax was discontinued 3 days after discharge due to episodes of hypotension and tachycardia which resolved after use of medication.  He does monitor blood pressure at home and typically 140s/80s with today's reading 138/72.  Endorses ongoing compliance with CPAP for OSA management but has been having difficulty with daily fatigue.  Recent low B12 level obtained by PCP and is currently being supplemented.  He has not recently been seen to ensure adequate management of OSA with CPAP.  He receives care through [place].  He continues on aspirin 81 mg daily and pravastatin 20 mg daily for stroke prevention without side effects.  No further concerns at this time.     ROS:   14 system review of systems performed and negative with exception of see HPI  PMH:  Past Medical History:  Diagnosis Date   Abnormal EKG    Arthritis    Benign essential hypertension, on Norvasc and Amlodipine 12/21/2008   Followed by [facility]    BPH (benign prostatic hyperplasia) 09/03/2012   COPD (chronic obstructive pulmonary disease) ([place])    Dyslipidemia 12/21/2008   Qualifier: Diagnosis of  By: Percival Spanish MD, Farrel Gordon     Elevated PSA 09/03/2012   Essential hypertension, benign 12/21/2008   Qualifier:  Diagnosis of  By: Percival Spanish, MD, Farrel Gordon     Former smoker, stopped smoking in distant past 03/15/2018   Headache    Hematuria 09/16/2011   Hx of colonic polyps 09/03/2012   Last colonoscopy March 2012 by Dr. Collene Mares    Obesity, unspecified 12/21/2008   Qualifier: Diagnosis of  By: Percival Spanish, MD, Farrel Gordon     OSA on CPAP 09/25/2015   Diagnosed at the [facility]  2016    Vitamin D deficiency 11/26/2010    PSH:  Past Surgical History:  Procedure Laterality Date   CHOLECYSTECTOMY  2001   TONSILLECTOMY AND ADENOIDECTOMY  1952    Social History:  Social History   Socioeconomic History   Marital status: Married    Spouse name: Not on file   Number of children: Not on file   Years of education: Not on file   Highest education level: Not on file  Occupational History   Not on file  Social Needs   Financial resource strain: Not on file   Food insecurity    Worry: Not on file    Inability: Not on file   Transportation needs    Medical: Not on file    Non-medical: Not on file  Tobacco Use   Smoking status: Former Smoker    Types: Cigarettes    Quit date: 08/18/1965    Years since quitting: 53.6   Smokeless tobacco: Never Used   Tobacco comment: pt quit smoking 53 yrs ago  Substance and Sexual Activity   Alcohol use: Yes    Alcohol/week: 2.0 - 6.0 standard drinks    Types: 2 - 6 Cans of beer per week   Drug use: No   Sexual activity: Yes  Lifestyle   Physical activity    Days per week: Not on file    Minutes per session: Not on file   Stress: Not on file  Relationships   Social connections    Talks on phone: Not on file    Gets together: Not on file    Attends religious service: Not on file    Active member of club or organization: Not on file    Attends meetings of clubs or organizations: Not on file    Relationship status: Not on file   Intimate partner violence    Fear of current or ex partner: Not on file    Emotionally abused: Not on file    Physically abused: Not on file    Forced sexual activity: Not on file  Other Topics Concern   Not on file  Social History Narrative   Not on file    Family History:  Family History  Problem Relation Age of Onset   Diabetes Mother    COPD Father    Stroke Paternal Grandfather     Medications:   Current Outpatient Medications on File Prior to Visit   Medication Sig Dispense Refill   alfuzosin (UROXATRAL) 10 MG 24 hr tablet Take 10 mg by mouth daily with breakfast.     aspirin 81 MG EC tablet Take 81 mg by mouth daily.      Cholecalciferol (VITAMIN D3) 2000 UNITS TABS Take 2,000 Int'l Units by mouth daily.      fluticasone (FLONASE) 50 MCG/ACT nasal spray USE TWO SPRAYS IN EACH NOSTRIL DAILY AS NEEDED. (Patient taking differently: Place 2 sprays into both nostrils daily. ) 48 g 1   latanoprost (XALATAN) 0.005 % ophthalmic solution INSTILL 1 DROP INTO BOTH EYES  AT BEDTIME (Patient taking differently: Place 1 drop into both eyes daily. ) 7.5 mL 5   losartan (COZAAR) 25 MG tablet TAKE 1 TABLET BY MOUTH EVERY DAY (Patient taking differently: Take 25 mg by mouth at bedtime. ) 90 tablet 0   montelukast (SINGULAIR) 10 MG tablet TAKE 1 TABLET BY MOUTH EVERY DAY 90 tablet 1   pravastatin (PRAVACHOL) 20 MG tablet TAKE 1 TABLET BY MOUTH EVERY DAY 90 tablet 1   No current facility-administered medications on file prior to visit.     Allergies:   Allergies  Allergen Reactions   Lipitor [Atorvastatin] Other (See Comments)    Leg Cramps   Lisinopril Cough     Physical Exam  Vitals:   04/12/19 0901  BP: 138/72  Pulse: 80  Temp: 98 F (36.7 C)  TempSrc: Oral  Weight: 248 lb 12.8 oz (112.9 kg)  Height: 5' 10.5" (1.791 m)   Body mass index is 35.19 kg/m. No exam data present  Depression screen [facility] 2/9 04/12/2019  Decreased Interest 0  Down, Depressed, Hopeless 0  PHQ - 2 Score 0  Altered sleeping -  Tired, decreased energy -  Change in appetite -  Feeling bad or failure about yourself  -  Trouble concentrating -  Moving slowly or fidgety/restless -  Suicidal thoughts -  PHQ-9 Score -  Difficult doing work/chores -     General: well developed, well nourished,  pleasant elderly Caucasian male, seated, in no evident distress Head: head normocephalic and atraumatic.   Neck: supple with no carotid or supraclavicular  bruits Cardiovascular: irregular rate and rhythm (chronic per patient), no murmurs Musculoskeletal: no deformity; limited right knee ROM due to pain Skin:  no rash/petichiae Vascular:  Normal pulses all extremities   Neurologic Exam Mental Status: Awake and fully alert. Oriented to place and time. Recent and remote memory intact. Attention span, concentration and fund of knowledge appropriate. Mood and affect appropriate.  Cranial Nerves: Fundoscopic exam reveals sharp disc margins. Pupils equal, briskly reactive to light. Extraocular movements full without nystagmus. Visual fields full to confrontation. Hearing intact. Facial sensation intact. Face, tongue, palate moves normally and symmetrically.  Motor: Normal bulk and tone. Normal strength in all tested extremity muscles. Sensory.: intact to touch , pinprick , position and vibratory sensation.  Coordination: Rapid alternating movements normal in all extremities. Finger-to-nose and heel-to-shin performed accurately bilaterally. Gait and Station: Arises from chair without difficulty. Stance is normal. Gait demonstrates normal stride length and balance Reflexes: 1+ and symmetric. Toes downgoing.      Diagnostic Data (Labs, Imaging, Testing)  CT HEAD WO CONTRAST 02/28/2019 IMPRESSION: No acute intracranial pathology. No non-contrast CT findings to explain headache or dizziness.  CT ANGIO HEAD W OR WO CONTRAST CT ANGIO NECK W OR WO CONTRAST 02/28/2019 IMPRESSION: Mild atherosclerotic disease in the carotid bifurcation without significant stenosis. Mild stenosis in the cavernous carotid bilaterally.  Moderate stenosis distal vertebral artery bilaterally. Right vertebral artery ends in PICA.  Negative for intracranial large vessel occlusion.  MR BRAIN WO CONTRAST 03/01/2019 IMPRESSION: Normal for age noncontrast MRI appearance of the brain.  ECHOCARDIOGRAM 03/01/2019 IMPRESSIONS  1. The left ventricle has normal systolic  function with an ejection fraction of 60-65%. The cavity size was normal. There is moderately increased left ventricular wall thickness. Left ventricular diastolic parameters were normal. No evidence of left  ventricular regional wall motion abnormalities.  2. The right ventricle has normal systolic function. The cavity was normal. There is no increase in  right ventricular wall thickness.  3. The mitral valve is abnormal. Mild thickening of the mitral valve leaflet. There is mild mitral annular calcification present.  4. The tricuspid valve is grossly normal.  5. The aortic valve is abnormal. Moderate calcification of the aortic valve. Mild stenosis of the aortic valve.  EEG 03/01/2019 IMPRESSION: This is a normal awake electroencephalogram with activation procedures. There are no focal lateralizing or epileptiform features.      ASSESSMENT: Eric Stokes is a 73 y.o. year old male presented with sudden headache with word finding difficulty and right-sided weakness on 02/28/2019 with stroke work-up remarkable with similar episodes in the past and felt symptoms related to migraine equivalent and less likely TIA. Vascular risk factors include HTN, former tobacco use, HLD, OSA on CPAP, and family history of stroke.  He has been stable since discharge without recurring symptoms or new stroke/TIA symptoms.    PLAN:  1. Migraine aura: Occurs 3-4 times daily without presence of headache.  Advised to continue to monitor 2. Stroke prevention: Continue aspirin 81 mg and pravastatin 20 mg daily for stroke prevention. 3. HTN: Advised to continue current treatment regimen.  Advised to continue to monitor at home along with continued follow-up with PCP for management 4. HLD: Advised to continue current treatment regimen along with continued follow-up with PCP for future prescribing and monitoring of lipid panel 5. OSA on CPAP: Recommend follow-up with PCP/cardiology to ensure adequate management of  apnea use   Per patient request, he [facility] for ongoing needs but advised to call office with any questions or concerns  Greater than 50% of time during this 45 minute visit was spent on counseling, explanation of diagnosis of migraine equivalent symptoms, reviewing risk factor management of HTN, HLD and OSA on CPAP, planning of further management along with potential future management, and discussion with patient and family answering all questions.    Frann Rider [facility]), AGNP-BC  [facility] [address] [place], [place] [address]  Phone [phone] Fax [phone] Note: This document was prepared with digital dictation and possible smart phrase technology. Any transcriptional errors that result from this process are unintentional.

## 2019-04-14 ENCOUNTER — Ambulatory Visit (INDEPENDENT_AMBULATORY_CARE_PROVIDER_SITE_OTHER): Payer: Medicare Other

## 2019-04-14 ENCOUNTER — Other Ambulatory Visit: Payer: Self-pay

## 2019-04-14 DIAGNOSIS — E538 Deficiency of other specified B group vitamins: Secondary | ICD-10-CM

## 2019-04-14 MED ORDER — CYANOCOBALAMIN 1000 MCG/ML IJ SOLN
1000.0000 ug | Freq: Once | INTRAMUSCULAR | Status: AC
  Administered 2019-04-14: 1000 ug via INTRAMUSCULAR

## 2019-04-14 NOTE — Patient Instructions (Addendum)
Health Maintenance Due  Topic Date Due  . COLONOSCOPY  06/12/1995  . INFLUENZA VACCINE  03/19/2019    Depression screen [facility] 2/9 04/12/2019 07/28/2018 03/15/2018  Decreased Interest 0 1 0  Down, Depressed, Hopeless 0 0 0  PHQ - 2 Score 0 1 0  Altered sleeping - 3 3  Tired, decreased energy - 3 3  Change in appetite - 1 1  Feeling bad or failure about yourself  - 0 0  Trouble concentrating - 0 0  Moving slowly or fidgety/restless - 0 0  Suicidal thoughts - 0 0  PHQ-9 Score - 8 7  Difficult doing work/chores - Not difficult at all Not difficult at all   Pernicious Anemia  Anemia is a condition in which the body does not have enough red blood cells or hemoglobin. Hemoglobin is a substance in red blood cells that carries oxygen. Pernicious anemia happens when the body does not have enough of a protein made in the stomach called intrinsic factor (IF). Without enough IF, your digestive tract cannot absorb enough of the vitamin B12 that your body needs to make red blood cells. Normally, you can get enough vitamin B12 from eating foods such as meat, poultry, eggs, and dairy products. If you have pernicious anemia, you do not absorb enough vitamin B12 from your diet, and anemia develops over time. When you have anemia, your organs may not work properly and you may feel very tired. Untreated pernicious anemia can lead to severe symptoms of anemia, including chest pain, heart failure, and permanent nervous system damage. What are the causes? The cause of this condition is not known. Pernicious anemia is believed to be an autoimmune disease. When you have an autoimmune disease, your body's defense system (immune system) mistakenly attacks normal cells in your body. With pernicious anemia, the immune system attacks cells that line the inside of the stomach (parietal cells). These cells secrete stomach acids and IF. What increases the risk? You are more likely to develop this condition if you:  Are older  than age 35.  Have a family history of pernicious anemia.  Are of [place] or [place] descent.  Have another type of autoimmune disease. What are the signs or symptoms? Pernicious anemia symptoms may take many years to develop. This condition usually does not cause symptoms until after a person is older than age 21. Signs and symptoms due to anemia include:  Tiredness.  Light-headedness.  A sore tongue or a burning sensation on the tongue.  A smooth red tongue.  Shortness of breath.  Fast heartbeat.  Chest pain. Stomach symptoms due to gradual loss of parietal cells include:  Nausea or vomiting.  Heartburn.  Weight loss without trying.  Diarrhea. Signs and symptoms due to nervous system damage include:  Dizziness.  Being unsteady while walking.  Tingling or numbness of hands and feet.  Irritability.  Depression.  Hallucinations or delusions.  Loss of smell. How is this diagnosed? In many cases, anemia may be found after you have a routine blood test. This condition may also be diagnosed based on your symptoms, having a family history of the condition, and a physical exam. You may also have tests to confirm the diagnosis. These may include blood tests to:  Count the different types of blood cells (complete blood count or CBC).  Test for different types of anemia.  Check for proteins made by your immune system (antibodies) that attack parietal cells. Almost all people with pernicious anemia have these  antibodies.  Check your B12 level. How is this treated? This condition may be treated with vitamin B12 replacement. This may include:  Injections of vitamin B12. This is the most common treatment. You will also have your blood level of B12 checked regularly. After you have a normal level of vitamin B12, and the anemia has been corrected, the injections can be given about once a month.  Taking a pill by mouth that contains a large dose of vitamin  B12.  Using a spray that you breathe in through your nose (nasal spray). A vitamin B12 nasal spray may be used to treat people who are not able to swallow supplements.  Long-term monitoring and regular visits to a health care provider. If the condition is detected and treatment is started in the early stages, most people do not develop complications. Treatment reverses the condition and prevents future anemia, but it must be continued for life. Having pernicious anemia also puts you at higher risk for stomach cancer. Follow these instructions at home:  Take over-the-counter and prescription medicines only as told by your health care provider.  Return to your normal activities as told by your health care provider. Ask your health care provider what activities are safe for you.  Keep all follow-up visits as told by your health care provider. This is important. Contact a health care provider if:  You develop new symptoms.  Your symptoms return or get worse after treatment.  You have stomach pain.  You feel full after eating a small meal.  You have trouble swallowing. Get help right away if:  You have chest pain.  You have a rapid heartbeat.  You have dizziness or you faint.  You have trouble breathing.  You have a feeling as if your heart is skipping beats or fluttering (palpitations).  You have pain, swelling, or redness in an arm or leg.  You cough up blood. These symptoms may represent a serious problem that is an emergency. Do not wait to see if the symptoms will go away. Get medical help right away. Call your local emergency services (911 in the [place] Do not drive yourself to the hospital. Summary  Pernicious anemia happens when your body cannot make enough red blood cells due to vitamin B12 deficiency.  This condition is usually caused by an autoimmune disease that attacks stomach cells that produce intrinsic factor (IF). You need IF to absorb vitamin  B12.  Pernicious anemia symptoms may take many years to develop.  This condition is sometimes discovered during routine blood testing.  Treatment of this condition includes vitamin B12 replacement for life. This information is not intended to replace advice given to you by your health care provider. Make sure you discuss any questions you have with your health care provider. Document Released: 11/18/2018 Document Revised: 11/18/2018 Document Reviewed: 11/18/2018 Elsevier Patient Education  [employer].  Anemia  Anemia is a condition in which you do not have enough red blood cells or hemoglobin. Hemoglobin is a substance in red blood cells that carries oxygen. When you do not have enough red blood cells or hemoglobin (are anemic), your body cannot get enough oxygen and your organs may not work properly. As a result, you may feel very tired or have other problems. What are the causes? Common causes of anemia include:  Excessive bleeding. Anemia can be caused by excessive bleeding inside or outside the body, including bleeding from the intestine or from periods in women.  Poor nutrition.  Long-lasting (  chronic) kidney, thyroid, and liver disease.  Bone marrow disorders.  Cancer and treatments for cancer.  HIV (human immunodeficiency virus) and AIDS (acquired immunodeficiency syndrome).  Treatments for HIV and AIDS.  Spleen problems.  Blood disorders.  Infections, medicines, and autoimmune disorders that destroy red blood cells. What are the signs or symptoms? Symptoms of this condition include:  Minor weakness.  Dizziness.  Headache.  Feeling heartbeats that are irregular or faster than normal (palpitations).  Shortness of breath, especially with exercise.  Paleness.  Cold sensitivity.  Indigestion.  Nausea.  Difficulty sleeping.  Difficulty concentrating. Symptoms may occur suddenly or develop slowly. If your anemia is mild, you may not have  symptoms. How is this diagnosed? This condition is diagnosed based on:  Blood tests.  Your medical history.  A physical exam.  Bone marrow biopsy. Your health care provider may also check your stool (feces) for blood and may do additional testing to look for the cause of your bleeding. You may also have other tests, including:  Imaging tests, such as a CT scan or MRI.  Endoscopy.  Colonoscopy. How is this treated? Treatment for this condition depends on the cause. If you continue to lose a lot of blood, you may need to be treated at a hospital. Treatment may include:  Taking supplements of iron, vitamin E33, or folic acid.  Taking a hormone medicine (erythropoietin) that can help to stimulate red blood cell growth.  Having a blood transfusion. This may be needed if you lose a lot of blood.  Making changes to your diet.  Having surgery to remove your spleen. Follow these instructions at home:  Take over-the-counter and prescription medicines only as told by your health care provider.  Take supplements only as told by your health care provider.  Follow any diet instructions that you were given.  Keep all follow-up visits as told by your health care provider. This is important. Contact a health care provider if:  You develop new bleeding anywhere in the body. Get help right away if:  You are very weak.  You are short of breath.  You have pain in your abdomen or chest.  You are dizzy or feel faint.  You have trouble concentrating.  You have bloody or black, tarry stools.  You vomit repeatedly or you vomit up blood. Summary  Anemia is a condition in which you do not have enough red blood cells or enough of a substance in your red blood cells that carries oxygen (hemoglobin).  Symptoms may occur suddenly or develop slowly.  If your anemia is mild, you may not have symptoms.  This condition is diagnosed with blood tests as well as a medical history and  physical exam. Other tests may be needed.  Treatment for this condition depends on the cause of the anemia. This information is not intended to replace advice given to you by your health care provider. Make sure you discuss any questions you have with your health care provider. Document Released: 09/11/2004 Document Revised: 07/17/2017 Document Reviewed: 09/05/2016 Elsevier Patient Education  2020 [employer].

## 2019-04-14 NOTE — Progress Notes (Signed)
Per orders of Dr. Juleen China, injection of B12 given in Left deltoid  by Franco Collet. Patient tolerated injection well.  Patient was shown how to self administer B12 as well as Bp log.

## 2019-04-18 ENCOUNTER — Other Ambulatory Visit: Payer: Self-pay

## 2019-04-18 ENCOUNTER — Telehealth: Payer: Self-pay | Admitting: Family Medicine

## 2019-04-18 MED ORDER — CYANOCOBALAMIN 1000 MCG/ML IJ SOLN: INTRAMUSCULAR | 0 refills | Status: DC

## 2019-04-18 MED ORDER — "SYRINGE 25G X 1"" 3 ML MISC": 1.0000 "application " | 0 refills | Status: DC

## 2019-04-18 NOTE — Telephone Encounter (Signed)
Pt requesting rx for B12 injections.  Please send to:  [facility], [address] [phone] (Phone) [phone] (Fax)

## 2019-04-18 NOTE — Telephone Encounter (Signed)
See note

## 2019-04-18 NOTE — Telephone Encounter (Signed)
Called in medications

## 2019-04-19 ENCOUNTER — Ambulatory Visit: Payer: Medicare Other

## 2019-04-21 ENCOUNTER — Other Ambulatory Visit: Payer: Self-pay

## 2019-04-21 ENCOUNTER — Ambulatory Visit (INDEPENDENT_AMBULATORY_CARE_PROVIDER_SITE_OTHER): Payer: Medicare Other

## 2019-04-21 DIAGNOSIS — E538 Deficiency of other specified B group vitamins: Secondary | ICD-10-CM | POA: Diagnosis not present

## 2019-04-21 MED ORDER — CYANOCOBALAMIN 1000 MCG/ML IJ SOLN
1000.0000 ug | Freq: Once | INTRAMUSCULAR | Status: AC
  Administered 2019-04-21: 1000 ug via INTRAMUSCULAR

## 2019-04-21 NOTE — Progress Notes (Signed)
Per orders of Dr. Jerline Pain, injection of B-12 given by Francella Solian in right deltoid. Patient tolerated injection well. Patient will make appointment for No new appointment needed he will start getting at home. Has follow up with Dr. Juleen China this month.

## 2019-05-05 ENCOUNTER — Ambulatory Visit: Payer: Medicare Other

## 2019-05-06 ENCOUNTER — Ambulatory Visit: Payer: Medicare Other | Admitting: Family Medicine

## 2019-05-12 ENCOUNTER — Other Ambulatory Visit: Payer: Self-pay | Admitting: Family Medicine

## 2019-05-12 NOTE — Progress Notes (Signed)
Virtual Visit via Video   Due to the COVID-19 pandemic, this visit was completed with telemedicine (audio/video) technology to reduce patient and provider exposure as well as to preserve personal protective equipment.   I connected with Eric Stokes by a video enabled telemedicine application and verified that I am speaking with the correct person using two identifiers. Location patient: Home Location provider:  [facility], Office Persons participating in the virtual visit: Eric Stokes, Eric Deutscher, DO Eric Stokes, CMA acting as scribe for Dr. Briscoe Stokes.   I discussed the limitations of evaluation and management by telemedicine and the availability of in person appointments. The patient expressed understanding and agreed to proceed.  Care Team   Patient Care Team: Eric Deutscher, DO as PCP - General (Family Medicine) Rod Can, MD as Consulting Physician (Orthopedic Surgery) Georgena Spurling as Physician Assistant (Cardiology) Juanita Craver, MD as Consulting Physician (Gastroenterology) Levy Sjogren, MD as Referring Physician (Dermatology) Festus Aloe, MD as Consulting Physician (Urology) Irine Seal, MD as Attending Physician (Urology)  Subjective:   HPI: See Assessment and Plan section for Problem Based Charting of issues discussed today.   Review of Systems  Constitutional: Negative for chills and fever.  HENT: Negative for hearing loss and tinnitus.   Eyes: Negative for blurred vision and double vision.  Respiratory: Negative for cough and wheezing.   Cardiovascular: Negative for chest pain, palpitations and leg swelling.  Gastrointestinal: Negative for heartburn and nausea.  Genitourinary: Negative for dysuria and urgency.  Musculoskeletal: Negative for myalgias and neck pain.  Neurological: Negative for dizziness and headaches.    Patient Active Problem List   Diagnosis Date Noted  . Obesity (BMI 30.0-34.9)  04/08/2019  . TIA (transient ischemic attack) 02/28/2019  . At risk for bradycardia 06/15/2018  . B12 deficiency, with monthly injections 06/15/2018  . Carotid bruit present 06/15/2018  . Primary osteoarthritis of right knee, followed by Orthopedics 04/12/2018  . Asthma, allergic, prn albuterol, triggered by perfumes 03/19/2018  . Former smoker, stopped smoking in distant past 03/15/2018  . OSA on CPAP 09/25/2015  . Elevated PSA 09/03/2012  . Hyperplasia of prostate with lower urinary tract symptoms (LUTS) 09/03/2012  . Hx of colonic polyps 09/03/2012  . Hematuria 09/16/2011  . Vitamin D deficiency 11/26/2010  . Allergic rhinitis, using Flonase 11/20/2010  . Hearing loss 11/20/2010  . Dyslipidemia, on Pravastatin 12/21/2008  . Benign essential hypertension 12/21/2008    Social History   Tobacco Use  . Smoking status: Former Smoker    Types: Cigarettes    Quit date: 08/18/1965    Years since quitting: 53.7  . Smokeless tobacco: Never Used  . Tobacco comment: pt quit smoking 53 yrs ago  Substance Use Topics  . Alcohol use: Yes    Alcohol/week: 2.0 - 6.0 standard drinks    Types: 2 - 6 Cans of beer per week    Current Outpatient Medications:  .  aspirin 81 MG EC tablet, Take 81 mg by mouth daily. , Disp: , Rfl:  .  Cholecalciferol (VITAMIN D3) 2000 UNITS TABS, Take 2,000 Int'l Units by mouth daily. , Disp: , Rfl:  .  cyanocobalamin (,VITAMIN B-12,) 1000 MCG/ML injection, 1000 MCG (1 MG) INJECTION ONCE PER WEEK FOR FOUR WEEKS, FOLLOWED BY 1000 MCG INJECTION ONCE PER MONTH., Disp: 4 mL, Rfl: 1 .  latanoprost (XALATAN) 0.005 % ophthalmic solution, INSTILL 1 DROP INTO BOTH EYES AT BEDTIME (Patient taking differently: Place 1 drop into both eyes daily. ),  Disp: 7.5 mL, Rfl: 5 .  losartan (COZAAR) 25 MG tablet, TAKE 1 TABLET BY MOUTH EVERY DAY (Patient taking differently: Take 25 mg by mouth at bedtime. ), Disp: 90 tablet, Rfl: 0 .  pravastatin (PRAVACHOL) 20 MG tablet, TAKE 1 TABLET BY  MOUTH EVERY DAY, Disp: 90 tablet, Rfl: 1 .  Syringe/Needle, Disp, (SYRINGE 3CC/25GX1") 25G X 1" 3 ML MISC, 1 application by Does not apply route once a week., Disp: 12 each, Rfl: 0 .  azelastine (ASTELIN) 0.1 % nasal spray, Place 1 spray into both nostrils 2 (two) times daily. Use in each nostril as directed, Disp: 30 mL, Rfl: 12  Allergies  Allergen Reactions  . Lipitor [Atorvastatin] Other (See Comments)    Leg Cramps  . Lisinopril Cough    Objective:   VITALS: Per patient if applicable, see vitals. GENERAL: Alert, appears well and in no acute distress. HEENT: Atraumatic, conjunctiva clear, no obvious abnormalities on inspection of external nose and ears. NECK: Normal movements of the head and neck. CARDIOPULMONARY: No increased WOB. Speaking in clear sentences. I:E ratio WNL.  MS: Moves all visible extremities without noticeable abnormality. PSYCH: Pleasant and cooperative, well-groomed. Speech normal rate and rhythm. Affect is appropriate. Insight and judgement are appropriate. Attention is focused, linear, and appropriate.  NEURO: CN grossly intact. Oriented as arrived to appointment on time with no prompting. Moves both UE equally.  SKIN: No obvious lesions, wounds, erythema, or cyanosis noted on face or hands.  Depression screen [facility] 2/9 04/12/2019 07/28/2018 03/15/2018  Decreased Interest 0 1 0  Down, Depressed, Hopeless 0 0 0  PHQ - 2 Score 0 1 0  Altered sleeping - 3 3  Tired, decreased energy - 3 3  Change in appetite - 1 1  Feeling bad or failure about yourself  - 0 0  Trouble concentrating - 0 0  Moving slowly or fidgety/restless - 0 0  Suicidal thoughts - 0 0  PHQ-9 Score - 8 7  Difficult doing work/chores - Not difficult at all Not difficult at all    Assessment and Plan:   Eric Stokes was seen today for follow-up.  Diagnoses and all orders for this visit:  Bradycardia Comments: Off any rate-reducing medications. No more orthostatic symptoms. Exercise tolerance  good. Feels heart pounding when rate > 70 bpm. SBP range 140-150.   B12 deficiency, with monthly injections Comments: Fatigue a little improved. Taking monthly.   Obesity (BMI 30.0-34.9)  Hyperplasia of prostate with lower urinary tract symptoms (LUTS) Comments: Will be seeing Urology 05/24/19. Not taking Flomax. No retention.   OSA on CPAP Comments: Compliant and feels that it is working.  Dyslipidemia, on Pravastatin Comments: Tolerating without myalgias.   Seasonal allergic rhinitis due to pollen Comments: Stopped Flonase (glaucoma), Singulair (irritability), Zyrtec (anticholinergic). Some flare after working in the yard. Will trial Astelin. Orders: -     azelastine (ASTELIN) 0.1 % nasal spray; Place 1 spray into both nostrils 2 (two) times daily. Use in each nostril as directed    . COVID-19 Education: The signs and symptoms of COVID-19 were discussed with the patient and how to seek care for testing if needed. The importance of social distancing was discussed today. . Reviewed expectations re: course of current medical issues. . Discussed self-management of symptoms. . Outlined signs and symptoms indicating need for more acute intervention. . Patient verbalized understanding and all questions were answered. Marland Kitchen Health Maintenance issues including appropriate healthy diet, exercise, and smoking avoidance were discussed with patient. Marland Kitchen  See orders for this visit as documented in the electronic medical record.  Eric Deutscher, DO  Records requested if needed. Time spent: 25 minutes, of which >50% was spent in obtaining information about his symptoms, reviewing his previous labs, evaluations, and treatments, counseling him about his condition (please see the discussed topics above), and developing a plan to further investigate it; he had a number of questions which I addressed.

## 2019-05-12 NOTE — Telephone Encounter (Signed)
Last fill 04/18/19  #59ml/0 Last OV 04/05/19

## 2019-05-13 ENCOUNTER — Ambulatory Visit (INDEPENDENT_AMBULATORY_CARE_PROVIDER_SITE_OTHER): Payer: Medicare Other | Admitting: Family Medicine

## 2019-05-13 ENCOUNTER — Other Ambulatory Visit: Payer: Self-pay

## 2019-05-13 ENCOUNTER — Encounter: Payer: Self-pay | Admitting: Family Medicine

## 2019-05-13 VITALS — BP 145/75 | HR 55 | Ht 70.5 in | Wt 245.0 lb

## 2019-05-13 DIAGNOSIS — G4733 Obstructive sleep apnea (adult) (pediatric): Secondary | ICD-10-CM

## 2019-05-13 DIAGNOSIS — E538 Deficiency of other specified B group vitamins: Secondary | ICD-10-CM

## 2019-05-13 DIAGNOSIS — E785 Hyperlipidemia, unspecified: Secondary | ICD-10-CM

## 2019-05-13 DIAGNOSIS — R001 Bradycardia, unspecified: Secondary | ICD-10-CM | POA: Diagnosis not present

## 2019-05-13 DIAGNOSIS — E669 Obesity, unspecified: Secondary | ICD-10-CM

## 2019-05-13 DIAGNOSIS — J301 Allergic rhinitis due to pollen: Secondary | ICD-10-CM

## 2019-05-13 DIAGNOSIS — N401 Enlarged prostate with lower urinary tract symptoms: Secondary | ICD-10-CM | POA: Diagnosis not present

## 2019-05-13 DIAGNOSIS — Z9989 Dependence on other enabling machines and devices: Secondary | ICD-10-CM

## 2019-05-13 MED ORDER — AZELASTINE HCL 0.1 % NA SOLN: 1.0000 | Freq: Two times a day (BID) | NASAL | 12 refills | Status: DC

## 2019-05-15 ENCOUNTER — Other Ambulatory Visit: Payer: Self-pay | Admitting: Family Medicine

## 2019-05-15 DIAGNOSIS — I10 Essential (primary) hypertension: Secondary | ICD-10-CM

## 2019-05-16 NOTE — Telephone Encounter (Signed)
Last ov 05/13/19 Last fill 02/10/19 #90/0

## 2019-05-19 ENCOUNTER — Other Ambulatory Visit: Payer: Self-pay | Admitting: Family Medicine

## 2019-05-19 LAB — PSA: PSA: 3.95

## 2019-05-20 NOTE — Telephone Encounter (Signed)
Last fill 05/12/19  #50ml/1 Last OV 05/13/19

## 2019-05-24 DIAGNOSIS — R3914 Feeling of incomplete bladder emptying: Secondary | ICD-10-CM | POA: Diagnosis not present

## 2019-05-27 ENCOUNTER — Encounter: Payer: Self-pay | Admitting: Family Medicine

## 2019-05-27 ENCOUNTER — Other Ambulatory Visit: Payer: Self-pay

## 2019-05-27 ENCOUNTER — Ambulatory Visit: Payer: Medicare Other

## 2019-08-08 ENCOUNTER — Other Ambulatory Visit: Payer: Self-pay

## 2019-08-08 NOTE — Patient Outreach (Signed)
[facility]) Care Management  08/08/2019  [name] [birth year] [id number]   Medication Adherence call to [name] [facility] Identifiers Verify spoke with patient he is past due on Pravastatin 20 mg,patient explain he is ONLY taking it 3 times a week now,patient has enough for a month and half,patient will order at the time. [name] is showing past due under [name].   [facility] Direct Dial [phone]  Fax [phone] [email or web address]

## 2019-08-11 ENCOUNTER — Other Ambulatory Visit: Payer: Self-pay | Admitting: *Deleted

## 2019-08-11 ENCOUNTER — Other Ambulatory Visit (INDEPENDENT_AMBULATORY_CARE_PROVIDER_SITE_OTHER): Payer: Medicare Other

## 2019-08-11 ENCOUNTER — Other Ambulatory Visit: Payer: Self-pay

## 2019-08-11 ENCOUNTER — Ambulatory Visit (INDEPENDENT_AMBULATORY_CARE_PROVIDER_SITE_OTHER): Payer: Medicare Other | Admitting: Family Medicine

## 2019-08-11 DIAGNOSIS — R319 Hematuria, unspecified: Secondary | ICD-10-CM

## 2019-08-11 DIAGNOSIS — R300 Dysuria: Secondary | ICD-10-CM

## 2019-08-11 DIAGNOSIS — IMO0002 Reserved for concepts with insufficient information to code with codable children: Secondary | ICD-10-CM

## 2019-08-11 DIAGNOSIS — N9989 Other postprocedural complications and disorders of genitourinary system: Secondary | ICD-10-CM | POA: Diagnosis not present

## 2019-08-11 LAB — POCT URINALYSIS DIP (CLINITEK)
Bilirubin, UA: NEGATIVE
Glucose, UA: NEGATIVE mg/dL
Nitrite, UA: NEGATIVE
POC PROTEIN,UA: 30 — AB
Spec Grav, UA: >=1.03 — AB (ref 1.010–1.025)
Urobilinogen, UA: 0.2 E.U./dL
pH, UA: 5.5 (ref 5.0–8.0)

## 2019-08-11 NOTE — Addendum Note (Signed)
Addended by: Francis Dowse T on: 08/11/2019 12:22 PM   Modules accepted: Orders

## 2019-08-11 NOTE — Progress Notes (Signed)
poct

## 2019-08-11 NOTE — Patient Instructions (Signed)
Please ontact your urologist or seek inperson care today for further evaluation of your symptoms.

## 2019-08-11 NOTE — Addendum Note (Signed)
Addended by: Francis Dowse T on: 08/11/2019 11:50 AM   Modules accepted: Orders

## 2019-08-11 NOTE — Progress Notes (Addendum)
Virtual Visit via Video Note  I connected with Eric Stokes  on 08/11/19 at 12:20 PM EST by a video enabled telemedicine application and verified that I am speaking with the correct person using two identifiers.  Location patient: home Location provider:work or home office Persons participating in the virtual visit: patient, provider  I discussed the limitations of evaluation and management by telemedicine and the availability of in person appointments. The patient expressed understanding and agreed to proceed.   HPI:  Acute visit for Dysuria: -started 4 days ago -symptoms frequency, urgency, dysuria, dark urine and fatigue -reports history of prostate issues and severe UTI in the past -no nausea, vomiting, fever, hematuria -denies hx of diabetes -PMH sig for elevated PSA, Hyperplasia of prostate with LUTS, frequency, urgency, dribbling and incomplete voiding - sees urologist and is on flomax, most recent PSA doubled from prior per PCP notes -he dropped urine off at his PCP office today and it was cloudy, brown, larg ketones, tr blood, P 30, mall leuks, nitrate neg  ROS: See pertinent positives and negatives per HPI.  Past Medical History:  Diagnosis Date  . Abnormal EKG   . Arthritis   . Benign essential hypertension, on Norvasc and Amlodipine 12/21/2008   Followed by [facility]   . BPH (benign prostatic hyperplasia) 09/03/2012  . COPD (chronic obstructive pulmonary disease) ([place])   . Dyslipidemia 12/21/2008   Qualifier: Diagnosis of  By: Percival Spanish, MD, Farrel Gordon    . Elevated PSA 09/03/2012  . Essential hypertension, benign 12/21/2008   Qualifier: Diagnosis of  By: Percival Spanish, MD, Farrel Gordon    . Former smoker, stopped smoking in distant past 03/15/2018  . Headache   . Hematuria 09/16/2011  . Hx of colonic polyps 09/03/2012   Last colonoscopy March 2012 by Dr. Collene Mares   . Obesity, unspecified 12/21/2008   Qualifier: Diagnosis of  By: Percival Spanish, MD, Farrel Gordon    . OSA on CPAP 09/25/2015    Diagnosed at [facility] 2016   . Vitamin D deficiency 11/26/2010    Past Surgical History:  Procedure Laterality Date  . CHOLECYSTECTOMY  2001  . TONSILLECTOMY AND ADENOIDECTOMY  1952    Family History  Problem Relation Age of Onset  . Diabetes Mother   . COPD Father   . Stroke Paternal Grandfather     SOCIAL HX: see hpi   Current Outpatient Medications:  .  aspirin 81 MG EC tablet, Take 81 mg by mouth daily. , Disp: , Rfl:  .  azelastine (ASTELIN) 0.1 % nasal spray, Place 1 spray into both nostrils 2 (two) times daily. Use in each nostril as directed, Disp: 30 mL, Rfl: 12 .  Cholecalciferol (VITAMIN D3) 2000 UNITS TABS, Take 2,000 Int'l Units by mouth daily. , Disp: , Rfl:  .  cyanocobalamin (,VITAMIN B-12,) 1000 MCG/ML injection, 1000 MCG (1 MG) INJECTION ONCE PER WEEK FOR FOUR WEEKS, FOLLOWED BY 1000 MCG INJECTION ONCE PER MONTH., Disp: 12 mL, Rfl: 1 .  latanoprost (XALATAN) 0.005 % ophthalmic solution, INSTILL 1 DROP INTO BOTH EYES AT BEDTIME (Patient taking differently: Place 1 drop into both eyes daily. ), Disp: 7.5 mL, Rfl: 5 .  losartan (COZAAR) 25 MG tablet, TAKE 1 TABLET BY MOUTH EVERY DAY, Disp: 90 tablet, Rfl: 0 .  pravastatin (PRAVACHOL) 20 MG tablet, TAKE 1 TABLET BY MOUTH EVERY DAY, Disp: 90 tablet, Rfl: 1 .  Syringe/Needle, Disp, (SYRINGE 3CC/25GX1") 25G X 1" 3 ML MISC, 1 application by Does not apply route  once a week., Disp: 12 each, Rfl: 0  EXAM:  VITALS per patient if applicable:denies fever  GENERAL: alert, oriented, appears well and in no acute distress  HEENT: atraumatic, conjunttiva clear, no obvious abnormalities on inspection of external nose and ears  NECK: normal movements of the head and neck  LUNGS: on inspection no signs of respiratory distress, breathing rate appears normal, no obvious gross SOB, gasping or wheezing  CV: no obvious cyanosis  MS: moves all visible extremities without noticeable abnormality  PSYCH/NEURO:  pleasant and cooperative, no obvious depression or anxiety, speech and thought processing grossly intact  ASSESSMENT AND PLAN:  Discussed the following assessment and plan:  Dysuria  -we discussed possible serious and likely etiologies, options for evaluation and workup, limitations of telemedicine visit vs in person visit, treatment, treatment risks and precautions. Pt prefers to treat via telemedicine empirically rather then risking or undertaking an in person visit at this moment. However, advised referral for prompt inperson/or urology eval given UTI in men can be complicated his case is further complicated by his history and urine results. Advised he contact his urologist right away to follow their instructions or seek in person evaluation today and advised of UCCs/ERs available. Patient agrees and plans to call his urologist right away.    I discussed the assessment and treatment plan with the patient. The patient was provided an opportunity to ask questions and all were answered. The patient agreed with the plan and demonstrated an understanding of the instructions.   The patient was advised to call back or seek an in-person evaluation if the symptoms worsen or if the condition fails to improve as anticipated.   Lucretia Kern, DO

## 2019-08-11 NOTE — Addendum Note (Signed)
Addended by: Francis Dowse T on: 08/11/2019 12:01 PM   Modules accepted: Orders

## 2019-08-11 NOTE — Addendum Note (Signed)
Addended by: Francis Dowse T on: 08/11/2019 11:48 AM   Modules accepted: Orders

## 2019-08-14 LAB — URINE CULTURE
MICRO NUMBER:: 1230371
SPECIMEN QUALITY:: ADEQUATE

## 2019-08-16 ENCOUNTER — Other Ambulatory Visit: Payer: Self-pay | Admitting: Family Medicine

## 2019-08-16 DIAGNOSIS — R3121 Asymptomatic microscopic hematuria: Secondary | ICD-10-CM | POA: Diagnosis not present

## 2019-08-16 DIAGNOSIS — R350 Frequency of micturition: Secondary | ICD-10-CM | POA: Diagnosis not present

## 2019-08-16 DIAGNOSIS — I10 Essential (primary) hypertension: Secondary | ICD-10-CM

## 2019-08-16 MED ORDER — LOSARTAN POTASSIUM 25 MG PO TABS: 25.0000 mg | ORAL_TABLET | Freq: Every day | ORAL | 0 refills | Status: DC

## 2019-08-23 DIAGNOSIS — R3129 Other microscopic hematuria: Secondary | ICD-10-CM | POA: Diagnosis not present

## 2019-08-23 DIAGNOSIS — R3121 Asymptomatic microscopic hematuria: Secondary | ICD-10-CM | POA: Diagnosis not present

## 2019-08-25 ENCOUNTER — Encounter: Payer: Self-pay | Admitting: Family Medicine

## 2019-08-29 ENCOUNTER — Other Ambulatory Visit: Payer: Self-pay

## 2019-08-29 MED ORDER — MONTELUKAST SODIUM 10 MG PO TABS: 10.0000 mg | ORAL_TABLET | Freq: Every day | ORAL | 0 refills | Status: DC

## 2019-09-08 LAB — PSA: PSA: 8.64

## 2019-09-21 DIAGNOSIS — R590 Localized enlarged lymph nodes: Secondary | ICD-10-CM | POA: Diagnosis not present

## 2019-09-21 DIAGNOSIS — R3914 Feeling of incomplete bladder emptying: Secondary | ICD-10-CM | POA: Diagnosis not present

## 2019-09-25 ENCOUNTER — Ambulatory Visit: Payer: Medicare Other | Attending: Internal Medicine

## 2019-09-25 DIAGNOSIS — Z23 Encounter for immunization: Secondary | ICD-10-CM | POA: Insufficient documentation

## 2019-09-25 NOTE — Progress Notes (Signed)
   Covid-19 Vaccination Clinic  Name:  LEV CIOLEK    MRN: [id number] DOB: 11-18-1945  09/25/2019  Mr. Gusmano was observed post Covid-19 immunization for 15 minutes without incidence. He was provided with Vaccine Information Sheet and instruction to access the V-Safe system.   Mr. Bowersock was instructed to call 911 with any severe reactions post vaccine: Marland Kitchen Difficulty breathing  . Swelling of your face and throat  . A fast heartbeat  . A bad rash all over your body  . Dizziness and weakness    Immunizations Administered    Name Date Dose VIS Date Route   Pfizer COVID-19 Vaccine 09/25/2019  5:12 PM 0.3 mL 07/29/2019 Intramuscular   Manufacturer: [place]   Lot: [id number]   [place]: [id number]

## 2019-10-05 ENCOUNTER — Encounter: Payer: Self-pay | Admitting: General Practice

## 2019-10-17 DIAGNOSIS — C61 Malignant neoplasm of prostate: Secondary | ICD-10-CM

## 2019-10-17 HISTORY — DX: Malignant neoplasm of prostate: C61

## 2019-10-18 ENCOUNTER — Encounter: Payer: Medicare Other | Admitting: Family Medicine

## 2019-10-20 ENCOUNTER — Ambulatory Visit: Payer: Medicare Other | Attending: Internal Medicine

## 2019-10-20 DIAGNOSIS — Z23 Encounter for immunization: Secondary | ICD-10-CM | POA: Insufficient documentation

## 2019-10-20 NOTE — Progress Notes (Signed)
   Covid-19 Vaccination Clinic  Name:  Eric Stokes    MRN: [id number] DOB: [birth year]  10/20/2019  [name] was observed post Covid-19 immunization for 15 minutes without incident. He was provided with Vaccine Information Sheet and instruction to access the V-Safe system.   [name] was instructed to call 911 with any severe reactions post vaccine: Marland Kitchen Difficulty breathing  . Swelling of face and throat  . A fast heartbeat  . A bad rash all over body  . Dizziness and weakness   Immunizations Administered    Name Date Dose VIS Date Route   Pfizer COVID-19 Vaccine 10/20/2019  1:33 PM 0.3 mL 07/29/2019 Intramuscular   Manufacturer: [place]   Lot: [id number]   [place]: [id number]

## 2019-10-24 DIAGNOSIS — N411 Chronic prostatitis: Secondary | ICD-10-CM | POA: Diagnosis not present

## 2019-11-07 DIAGNOSIS — R350 Frequency of micturition: Secondary | ICD-10-CM | POA: Diagnosis not present

## 2019-11-15 DIAGNOSIS — L570 Actinic keratosis: Secondary | ICD-10-CM | POA: Diagnosis not present

## 2019-11-15 DIAGNOSIS — X32XXXD Exposure to sunlight, subsequent encounter: Secondary | ICD-10-CM | POA: Diagnosis not present

## 2019-11-15 DIAGNOSIS — L820 Inflamed seborrheic keratosis: Secondary | ICD-10-CM | POA: Diagnosis not present

## 2019-11-15 DIAGNOSIS — D225 Melanocytic nevi of trunk: Secondary | ICD-10-CM | POA: Diagnosis not present

## 2019-11-15 DIAGNOSIS — L814 Other melanin hyperpigmentation: Secondary | ICD-10-CM | POA: Diagnosis not present

## 2019-11-15 DIAGNOSIS — L821 Other seborrheic keratosis: Secondary | ICD-10-CM | POA: Diagnosis not present

## 2019-11-21 ENCOUNTER — Other Ambulatory Visit: Payer: Self-pay | Admitting: Family Medicine

## 2019-11-21 DIAGNOSIS — I10 Essential (primary) hypertension: Secondary | ICD-10-CM

## 2019-11-30 DIAGNOSIS — C61 Malignant neoplasm of prostate: Secondary | ICD-10-CM | POA: Insufficient documentation

## 2019-11-30 DIAGNOSIS — R591 Generalized enlarged lymph nodes: Secondary | ICD-10-CM | POA: Insufficient documentation

## 2019-11-30 HISTORY — DX: Malignant neoplasm of prostate: C61

## 2019-12-01 DIAGNOSIS — Z9049 Acquired absence of other specified parts of digestive tract: Secondary | ICD-10-CM | POA: Diagnosis not present

## 2019-12-01 DIAGNOSIS — Z87891 Personal history of nicotine dependence: Secondary | ICD-10-CM | POA: Diagnosis not present

## 2019-12-01 DIAGNOSIS — I10 Essential (primary) hypertension: Secondary | ICD-10-CM | POA: Diagnosis not present

## 2019-12-01 DIAGNOSIS — R319 Hematuria, unspecified: Secondary | ICD-10-CM | POA: Diagnosis not present

## 2019-12-01 DIAGNOSIS — Z9989 Dependence on other enabling machines and devices: Secondary | ICD-10-CM | POA: Diagnosis not present

## 2019-12-01 DIAGNOSIS — R590 Localized enlarged lymph nodes: Secondary | ICD-10-CM | POA: Diagnosis not present

## 2019-12-01 DIAGNOSIS — Z87440 Personal history of urinary (tract) infections: Secondary | ICD-10-CM | POA: Diagnosis not present

## 2019-12-01 DIAGNOSIS — H9190 Unspecified hearing loss, unspecified ear: Secondary | ICD-10-CM | POA: Diagnosis not present

## 2019-12-01 DIAGNOSIS — Z933 Colostomy status: Secondary | ICD-10-CM | POA: Diagnosis not present

## 2019-12-01 DIAGNOSIS — J45909 Unspecified asthma, uncomplicated: Secondary | ICD-10-CM | POA: Diagnosis not present

## 2019-12-01 DIAGNOSIS — I2510 Atherosclerotic heart disease of native coronary artery without angina pectoris: Secondary | ICD-10-CM | POA: Diagnosis not present

## 2019-12-01 DIAGNOSIS — M1990 Unspecified osteoarthritis, unspecified site: Secondary | ICD-10-CM | POA: Diagnosis not present

## 2019-12-01 DIAGNOSIS — G4733 Obstructive sleep apnea (adult) (pediatric): Secondary | ICD-10-CM | POA: Diagnosis not present

## 2019-12-01 DIAGNOSIS — C4492 Squamous cell carcinoma of skin, unspecified: Secondary | ICD-10-CM | POA: Diagnosis not present

## 2019-12-01 DIAGNOSIS — E538 Deficiency of other specified B group vitamins: Secondary | ICD-10-CM | POA: Diagnosis not present

## 2019-12-01 DIAGNOSIS — R591 Generalized enlarged lymph nodes: Secondary | ICD-10-CM | POA: Diagnosis not present

## 2019-12-23 DIAGNOSIS — R300 Dysuria: Secondary | ICD-10-CM | POA: Diagnosis not present

## 2019-12-29 DIAGNOSIS — R350 Frequency of micturition: Secondary | ICD-10-CM | POA: Diagnosis not present

## 2020-01-06 ENCOUNTER — Encounter: Payer: Medicare Other | Admitting: Family Medicine

## 2020-01-09 ENCOUNTER — Encounter: Payer: Self-pay | Admitting: Family Medicine

## 2020-01-09 ENCOUNTER — Ambulatory Visit (INDEPENDENT_AMBULATORY_CARE_PROVIDER_SITE_OTHER): Payer: Medicare Other | Admitting: Family Medicine

## 2020-01-09 ENCOUNTER — Other Ambulatory Visit: Payer: Self-pay

## 2020-01-09 VITALS — BP 130/62 | HR 75 | Temp 98.6°F | Resp 18 | Ht 70.5 in | Wt 248.6 lb

## 2020-01-09 DIAGNOSIS — I10 Essential (primary) hypertension: Secondary | ICD-10-CM

## 2020-01-09 DIAGNOSIS — G4733 Obstructive sleep apnea (adult) (pediatric): Secondary | ICD-10-CM

## 2020-01-09 DIAGNOSIS — Z9989 Dependence on other enabling machines and devices: Secondary | ICD-10-CM

## 2020-01-09 DIAGNOSIS — E559 Vitamin D deficiency, unspecified: Secondary | ICD-10-CM

## 2020-01-09 DIAGNOSIS — E785 Hyperlipidemia, unspecified: Secondary | ICD-10-CM

## 2020-01-09 DIAGNOSIS — E538 Deficiency of other specified B group vitamins: Secondary | ICD-10-CM | POA: Diagnosis not present

## 2020-01-09 DIAGNOSIS — E669 Obesity, unspecified: Secondary | ICD-10-CM

## 2020-01-09 DIAGNOSIS — C61 Malignant neoplasm of prostate: Secondary | ICD-10-CM | POA: Diagnosis not present

## 2020-01-09 DIAGNOSIS — M1711 Unilateral primary osteoarthritis, right knee: Secondary | ICD-10-CM

## 2020-01-09 DIAGNOSIS — K635 Polyp of colon: Secondary | ICD-10-CM

## 2020-01-09 DIAGNOSIS — J301 Allergic rhinitis due to pollen: Secondary | ICD-10-CM

## 2020-01-09 DIAGNOSIS — J4520 Mild intermittent asthma, uncomplicated: Secondary | ICD-10-CM

## 2020-01-09 HISTORY — DX: Polyp of colon: K63.5

## 2020-01-09 LAB — CBC WITH DIFFERENTIAL/PLATELET
Basophils Absolute: 0.1 10*3/uL (ref 0.0–0.1)
Basophils Relative: 0.8 % (ref 0.0–3.0)
Eosinophils Absolute: 0.1 10*3/uL (ref 0.0–0.7)
Eosinophils Relative: 1.5 % (ref 0.0–5.0)
HCT: 41.2 % (ref 39.0–52.0)
Hemoglobin: 14.1 g/dL (ref 13.0–17.0)
Lymphocytes Relative: 25.3 % (ref 12.0–46.0)
Lymphs Abs: 1.6 10*3/uL (ref 0.7–4.0)
MCHC: 34.3 g/dL (ref 30.0–36.0)
MCV: 87.6 fl (ref 78.0–100.0)
Monocytes Absolute: 0.4 10*3/uL (ref 0.1–1.0)
Monocytes Relative: 6.2 % (ref 3.0–12.0)
Neutro Abs: 4.2 10*3/uL (ref 1.4–7.7)
Neutrophils Relative %: 66.2 % (ref 43.0–77.0)
Platelets: 198 10*3/uL (ref 150.0–400.0)
RBC: 4.7 Mil/uL (ref 4.22–5.81)
RDW: 14.6 % (ref 11.5–15.5)
WBC: 6.3 10*3/uL (ref 4.0–10.5)

## 2020-01-09 LAB — COMPREHENSIVE METABOLIC PANEL
ALT: 18 U/L (ref 0–53)
AST: 16 U/L (ref 0–37)
Albumin: 4.4 g/dL (ref 3.5–5.2)
Alkaline Phosphatase: 52 U/L (ref 39–117)
BUN: 19 mg/dL (ref 6–23)
CO2: 24 mEq/L (ref 19–32)
Calcium: 9.4 mg/dL (ref 8.4–10.5)
Chloride: 104 mEq/L (ref 96–112)
Creatinine, Ser: 0.95 mg/dL (ref 0.40–1.50)
GFR: 77.38 mL/min (ref >60.00)
Glucose, Bld: 122 mg/dL — ABNORMAL HIGH (ref 70–99)
Potassium: 3.9 mEq/L (ref 3.5–5.1)
Sodium: 138 mEq/L (ref 135–145)
Total Bilirubin: 0.6 mg/dL (ref 0.2–1.2)
Total Protein: 7.1 g/dL (ref 6.0–8.3)

## 2020-01-09 LAB — LIPID PANEL
Cholesterol: 218 mg/dL — ABNORMAL HIGH (ref 0–200)
HDL: 46.5 mg/dL (ref >39.00)
NonHDL: 171.32
Total CHOL/HDL Ratio: 5
Triglycerides: 214 mg/dL — ABNORMAL HIGH (ref 0.0–149.0)
VLDL: 42.8 mg/dL — ABNORMAL HIGH (ref 0.0–40.0)

## 2020-01-09 LAB — LDL CHOLESTEROL, DIRECT: Direct LDL: 139 mg/dL

## 2020-01-09 LAB — VITAMIN D 25 HYDROXY (VIT D DEFICIENCY, FRACTURES): VITD: 42.62 ng/mL (ref 30.00–100.00)

## 2020-01-09 LAB — TSH: TSH: 2.05 u[IU]/mL (ref 0.35–4.50)

## 2020-01-09 LAB — VITAMIN B12: Vitamin B-12: 216 pg/mL (ref 211–911)

## 2020-01-09 NOTE — Patient Instructions (Addendum)
Please return in 6 months for your annual complete physical; please come fasting.  I will release your lab results to you on your MyChart account with further instructions. Please reply with any questions.  We will get you set up for your B12 injections once I get your lab work back.   Best of luck to you with your prostate care.  If you have any questions or concerns, please don't hesitate to send me a message via MyChart or call the office at [phone]. Thank you for visiting with [place] today! It's our pleasure caring for you.

## 2020-01-09 NOTE — Progress Notes (Signed)
Subjective  CC:  Chief Complaint  Patient presents with  . Transitions Of Care    He would like to discuss his treatment plan for his prostate cancer. Non-fasting labs  . Health Maintenance    Colonoscopy not due til next year    HPI: Eric Stokes is a 74 y.o. male who presents to [place] at [place] today to establish care with me as a new patient.  Reviewed chart He has the following concerns or needs:  Prostate cancer and BPH: managed by uro at [facility] and had consult at [facility]. Has very large prostate and low gleason score small adenocarcinoma of the prostate: will try to shrink prostate with finasteride and then MRI in 6 months prior to any intervention for cancer. He may need f/u labs done here.   b12 deficeincey - had been on IM monthly supplements. Would like to restart again.   HTN and HLD - have been controlled on meds. Due for recheck   Knees: needs right TKR soon. Needs prop clearance. No cp or sob  Allergies intermttent  Assessment  1. Benign essential hypertension   2. Prostate cancer ([place])   3. B12 deficiency, with monthly injections   4. Vitamin D deficiency   5. Dyslipidemia, on Pravastatin   6. Mild intermittent extrinsic asthma without complication   7. OSA on CPAP   8. Primary osteoarthritis of right knee, followed by Orthopedics   9. Obesity (BMI 30.0-34.9)   10. Seasonal allergic rhinitis due to pollen      Plan   Prostate CA with BPH - per urology. Finasteride and monitor.   HTN controlled.  Recheck lipids today. Possible h/o TIA so want LDL < 70.   Check vit d and b12; supplement if indicated again.  OA - medically cleared for surgery pending labs.   Allergic asthma - stable .  Follow up:  Return in about 6 months (around 07/11/2020) for complete physical. Orders Placed This Encounter  Procedures  . CBC with Differential/Platelet  . Comprehensive metabolic panel  . Lipid panel  . TSH  . Vitamin B12  . VITAMIN D  25 Hydroxy (Vit-D Deficiency, Fractures)   No orders of the defined types were placed in this encounter.    Depression screen [facility] 2/9 04/12/2019 07/28/2018 03/15/2018 12/16/2016 09/25/2015  Decreased Interest 0 1 0 0 0  Down, Depressed, Hopeless 0 0 0 0 1  PHQ - 2 Score 0 1 0 0 1  Altered sleeping - 3 3 - -  Tired, decreased energy - 3 3 - -  Change in appetite - 1 1 - -  Feeling bad or failure about yourself  - 0 0 - -  Trouble concentrating - 0 0 - -  Moving slowly or fidgety/restless - 0 0 - -  Suicidal thoughts - 0 0 - -  PHQ-9 Score - 8 7 - -  Difficult doing work/chores - Not difficult at all Not difficult at all - -    We updated and reviewed the patient's past history in detail and it is documented below.  Patient Active Problem List   Diagnosis Date Noted  . Colonic polyp 01/09/2020  . Prostate cancer ([place]) 11/30/2019  . Lymphadenopathy 11/30/2019  . Obesity (BMI 30.0-34.9) 04/08/2019  . TIA (transient ischemic attack) 02/28/2019  . At risk for bradycardia 06/15/2018  . B12 deficiency, with monthly injections 06/15/2018  . Primary osteoarthritis of right knee, followed by Orthopedics 04/12/2018  . Asthma,  allergic, prn albuterol, triggered by perfumes 03/19/2018    2/2 soap, perfume. Uses albuterol prn. Needs q 6 months.    . OSA on CPAP 09/25/2015    Dream Machine, full mask, + humidifier, preset pressure dial.   . Hyperplasia of prostate with lower urinary tract symptoms (LUTS) 09/03/2012    [facility] last appointment 02/28/2019   . Vitamin D deficiency 11/26/2010  . Allergic rhinitis, using Flonase 11/20/2010  . Hearing loss 11/20/2010  . Dyslipidemia, on Pravastatin 12/21/2008  . Benign essential hypertension 12/21/2008   Health Maintenance  Topic Date Due  . COLONOSCOPY  Never done  . INFLUENZA VACCINE  03/18/2020  . TETANUS/TDAP  11/17/2022  . COVID-19 Vaccine  Completed  . Hepatitis C Screening  Completed  . PNA vac Low Risk Adult   Completed   Immunization History  Administered Date(s) Administered  . Influenza, High Dose Seasonal PF 05/19/2018  . Influenza,inj,Quad PF,6+ Mos 09/06/2013  . Influenza-Unspecified 08/03/2014, 06/01/2015  . PFIZER SARS-COV-2 Vaccination 09/25/2019, 10/20/2019  . Pneumococcal Conjugate-13 09/06/2013  . Pneumococcal Polysaccharide-23 06/01/2015  . Pneumococcal-Unspecified 11/20/2010  . Td 08/18/2004, 11/16/2012  . Tdap 08/21/2010  . Zoster 11/20/2010, 06/19/2011   Current Meds  Medication Sig  . albuterol (VENTOLIN HFA) 108 (90 Base) MCG/ACT inhaler Inhale 2 puffs into the lungs as needed for wheezing or shortness of breath.  . alfuzosin (UROXATRAL) 10 MG 24 hr tablet Take 5 mg by mouth in the morning and at bedtime.  Marland Kitchen aspirin 81 MG EC tablet Take 81 mg by mouth daily.   . cetirizine (ZYRTEC) 10 MG tablet Take 10 mg by mouth daily.  . Cholecalciferol (VITAMIN D3) 2000 UNITS TABS Take 2,000 Int'l Units by mouth daily.   . cyanocobalamin (,VITAMIN B-12,) 1000 MCG/ML injection 1000 MCG (1 MG) INJECTION ONCE PER WEEK FOR FOUR WEEKS, FOLLOWED BY 1000 MCG INJECTION ONCE PER MONTH.  . finasteride (PROSCAR) 5 MG tablet Take 5 mg by mouth daily.  . fluticasone (FLONASE) 50 MCG/ACT nasal spray Place 1 spray into both nostrils as needed for allergies or rhinitis.  Marland Kitchen ibuprofen (ADVIL) 400 MG tablet Take 400 mg by mouth as needed.  . latanoprost (XALATAN) 0.005 % ophthalmic solution INSTILL 1 DROP INTO BOTH EYES AT BEDTIME (Patient taking differently: Place 1 drop into both eyes daily. )  . losartan (COZAAR) 25 MG tablet TAKE 1 TABLET BY MOUTH EVERY DAY  . montelukast (SINGULAIR) 10 MG tablet Take 1 tablet (10 mg total) by mouth at bedtime. TAKE 1 TABLET BY MOUTH EVERY DAY  . pravastatin (PRAVACHOL) 20 MG tablet TAKE 1 TABLET BY MOUTH EVERY DAY  . Syringe/Needle, Disp, (SYRINGE 3CC/25GX1") 25G X 1" 3 ML MISC 1 application by Does not apply route once a week.    Allergies: Patient is allergic to  tamsulosin; cipro [ciprofloxacin hcl]; lipitor [atorvastatin]; and lisinopril. Past Medical History Patient  has a past medical history of Arthritis, Benign essential hypertension, on Norvasc and Amlodipine (12/21/2008), BPH (benign prostatic hyperplasia) (09/03/2012), Carotid bruit present (06/15/2018), Colonic polyp (01/09/2020), COPD (chronic obstructive pulmonary disease) ([place]), Dyslipidemia (12/21/2008), Essential hypertension, benign (12/21/2008), Former smoker, stopped smoking in distant past (03/15/2018), Hematuria (09/16/2011), OSA on CPAP (09/25/2015), Prostate cancer ([place]) (11/30/2019), and Vitamin D deficiency (11/26/2010). Past Surgical History Patient  has a past surgical history that includes Cholecystectomy (2001) and Tonsillectomy and adenoidectomy (1952). Family History: Patient family history includes COPD in his father; Diabetes in his mother; Stroke in his paternal grandfather. Social History:  Patient  reports that he quit smoking about 54 years ago. His smoking use included cigarettes. He has never used smokeless tobacco. He reports current alcohol use of about 2.0 - 6.0 standard drinks of alcohol per week. He reports that he does not use drugs.  Review of Systems: Constitutional: negative for fever or malaise Ophthalmic: negative for photophobia, double vision or loss of vision Cardiovascular: negative for chest pain, dyspnea on exertion, or new LE swelling Respiratory: negative for SOB or persistent cough Gastrointestinal: negative for abdominal pain, change in bowel habits or melena Genitourinary: negative for dysuria or gross hematuria Psychiatric: negative for SI or delusions Allergic/Immunologic: negative for hives  Patient Care Team    Relationship Specialty Notifications Start End  Leamon Arnt, MD PCP - General Family Medicine  01/09/20   Rod Can, MD Consulting Physician Orthopedic Surgery  03/15/18   Consuelo Pandy, PA-C Physician Assistant Cardiology   03/15/18   Juanita Craver, MD Consulting Physician Gastroenterology  03/15/18   Levy Sjogren, MD Referring Physician Dermatology  03/15/18   Festus Aloe, MD Consulting Physician Urology  03/15/18   Irine Seal, MD Attending Physician Urology  04/07/19     Objective  Vitals: BP 130/62   Pulse 75   Temp 98.6 F (37 C) (Temporal)   Resp 18   Ht 5' 10.5" (1.791 m)   Wt 248 lb 9.6 oz (112.8 kg)   SpO2 98%   BMI 35.17 kg/m  General:  Well developed, well nourished, no acute distress  Psych:  Alert and oriented,normal mood and affect HEENT:  Normocephalic, atraumatic, non-icteric sclera, supple neck without adenopathy, mass or thyromegaly Cardiovascular:  RRR without gallop, rub or murmur Respiratory:  Good breath sounds bilaterally, CTAB with normal respiratory effort Neurologic:    Mental status is normal. Gross motor and sensory exams are normal. Normal gait   Commons side effects, risks, benefits, and alternatives for medications and treatment plan prescribed today were discussed, and the patient expressed understanding of the given instructions. Patient is instructed to call or message via MyChart if he/she has any questions or concerns regarding our treatment plan. No barriers to understanding were identified. We discussed Red Flag symptoms and signs in detail. Patient expressed understanding regarding what to do in case of urgent or emergency type symptoms.   Medication list was reconciled, printed and provided to the patient in AVS. Patient instructions and summary information was reviewed with the patient as documented in the AVS. This note was prepared with assistance of Dragon voice recognition software. Occasional wrong-word or sound-a-like substitutions may have occurred due to the inherent limitations of voice recognition software  This visit occurred during the SARS-CoV-2 public health emergency.  Safety protocols were in place, including screening questions prior to  the visit, additional usage of staff PPE, and extensive cleaning of exam room while observing appropriate contact time as indicated for disinfecting solutions.

## 2020-01-10 ENCOUNTER — Other Ambulatory Visit: Payer: Self-pay

## 2020-01-10 DIAGNOSIS — E785 Hyperlipidemia, unspecified: Secondary | ICD-10-CM

## 2020-01-10 DIAGNOSIS — E559 Vitamin D deficiency, unspecified: Secondary | ICD-10-CM

## 2020-01-10 MED ORDER — ROSUVASTATIN CALCIUM 20 MG PO TABS
20.0000 mg | ORAL_TABLET | Freq: Every day | ORAL | 3 refills | Status: DC
Start: 2020-01-10 — End: 2020-03-14

## 2020-01-17 ENCOUNTER — Other Ambulatory Visit: Payer: Self-pay

## 2020-01-17 ENCOUNTER — Encounter: Payer: Self-pay | Admitting: Family Medicine

## 2020-01-17 ENCOUNTER — Ambulatory Visit (INDEPENDENT_AMBULATORY_CARE_PROVIDER_SITE_OTHER): Payer: Medicare Other

## 2020-01-17 DIAGNOSIS — E559 Vitamin D deficiency, unspecified: Secondary | ICD-10-CM

## 2020-01-17 DIAGNOSIS — E538 Deficiency of other specified B group vitamins: Secondary | ICD-10-CM

## 2020-01-17 MED ORDER — CYANOCOBALAMIN 1000 MCG/ML IJ SOLN
1000.0000 ug | Freq: Once | INTRAMUSCULAR | Status: AC
  Administered 2020-01-17: 1000 ug via INTRAMUSCULAR

## 2020-01-17 NOTE — Progress Notes (Signed)
Per orders of Dr. Andy , injection of Vitamin B12 given by Niylah Hassan D Chai Verdejo in left deltoid. Patient tolerated injection well. Patient will make appointment for 1 month. °

## 2020-01-19 ENCOUNTER — Encounter: Payer: Self-pay | Admitting: Family Medicine

## 2020-01-19 ENCOUNTER — Other Ambulatory Visit: Payer: Self-pay

## 2020-01-19 ENCOUNTER — Ambulatory Visit: Payer: Medicare Other | Admitting: Physician Assistant

## 2020-01-19 ENCOUNTER — Telehealth: Payer: Self-pay

## 2020-01-19 ENCOUNTER — Encounter: Payer: Self-pay | Admitting: Physician Assistant

## 2020-01-19 VITALS — BP 156/82 | HR 94 | Ht 70.5 in | Wt 249.0 lb

## 2020-01-19 DIAGNOSIS — R9431 Abnormal electrocardiogram [ECG] [EKG]: Secondary | ICD-10-CM

## 2020-01-19 DIAGNOSIS — C61 Malignant neoplasm of prostate: Secondary | ICD-10-CM

## 2020-01-19 DIAGNOSIS — I10 Essential (primary) hypertension: Secondary | ICD-10-CM | POA: Diagnosis not present

## 2020-01-19 DIAGNOSIS — I493 Ventricular premature depolarization: Secondary | ICD-10-CM

## 2020-01-19 DIAGNOSIS — I350 Nonrheumatic aortic (valve) stenosis: Secondary | ICD-10-CM | POA: Diagnosis not present

## 2020-01-19 DIAGNOSIS — Z01818 Encounter for other preprocedural examination: Secondary | ICD-10-CM

## 2020-01-19 NOTE — Progress Notes (Signed)
Cardiology Office Note    Date:  01/19/2020   ID:  Eric Stokes, DOB October 30, 1945, MRN [id number]  PCP:  Leamon Arnt, MD  Cardiologist: Dr. Burt Knack  Chief Complaint: 21  Months follow up  History of Present Illness:   Eric Stokes is a 74 y.o. male with hx of sinus bradycardia, PVC, hypertension, hyperlipidemia, prostate cancer, BPH and obstructive sleep apnea on CPAP therapy presents for long-term follow-up.  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  Exercise tolerance test in 2015 at [facility] was negative for ischemia.  Establish care with [facility] heart cath in 2018 for sinus bradycardia.  Seen by Eric Henri, PA along with Dr. Burt Knack.  Follow-up ETT was normal without ischemia.  Had appropriate increase in heart rate.  Follow-up cardiac monitor showed sinus rhythm with episode of bradycardia without prolonged pauses.  The patient has been followed up by Eric Stokes, last seen 04/2018 >> advised PRN follow up.   Patient is diagnosed with process cancer earlier this year.  Currently taking finasteride to shrink the size.  He may require prostatectomy in future.  He needs right total knee arthroplasty and here for surgical clearance.  He is very active at baseline.  No regular exercise but does heavy yard work and gardening every day.  He uses push mows lawn. He dig 60 feet long strip for waterproof basement. Occasionally he feels PVCs but no sustained palpitations.   Past Medical History:  Diagnosis Date  . Arthritis   . Benign essential hypertension, on Norvasc and Amlodipine 12/21/2008   Followed by [facility]   . BPH (benign prostatic hyperplasia) 09/03/2012  . Carotid bruit present 06/15/2018   2015 Carotid Dopplers:  Essentially normal carotid arteries, with very slight hard plaque in the proximal ICA's and serpentine distal vessels. 1-39% bilateral ICA stenosis. Patent vertebral arteries with antegrade flow. Normal subclavian  arteries, bilaterally.  . Colonic polyp 01/09/2020  . COPD (chronic obstructive pulmonary disease) ([place])   . Dyslipidemia 12/21/2008   Qualifier: Diagnosis of  By: Percival Spanish, MD, Farrel Gordon    . Essential hypertension, benign 12/21/2008   Qualifier: Diagnosis of  By: Percival Spanish, MD, Farrel Gordon    . Former smoker, stopped smoking in distant past 03/15/2018  . Hematuria 09/16/2011  . OSA on CPAP 09/25/2015   Diagnosed at the [facility] 2016   . Prostate cancer ([place]) 11/30/2019  . Vitamin D deficiency 11/26/2010    Past Surgical History:  Procedure Laterality Date  . CHOLECYSTECTOMY  2001  . TONSILLECTOMY AND ADENOIDECTOMY  1952    Current Medications: Prior to Admission medications   Medication Sig Start Date End Date Taking? Authorizing Provider  albuterol (VENTOLIN HFA) 108 (90 Base) MCG/ACT inhaler Inhale 2 puffs into the lungs as needed for wheezing or shortness of breath.    [provider]  alfuzosin (UROXATRAL) 10 MG 24 hr tablet Take 5 mg by mouth in the morning and at bedtime.    [provider]  aspirin 81 MG EC tablet Take 81 mg by mouth daily.     [provider]  cetirizine (ZYRTEC) 10 MG tablet Take 10 mg by mouth daily. 01/16/13   [provider]  Cholecalciferol (VITAMIN D3) 2000 UNITS TABS Take 2,000 Int'l Units by mouth daily.     [provider]  cyanocobalamin (,VITAMIN B-12,) 1000 MCG/ML injection 1000 MCG (1 MG) INJECTION ONCE PER WEEK FOR FOUR WEEKS, FOLLOWED BY 1000 MCG  INJECTION ONCE PER MONTH. 05/20/19   Briscoe Deutscher, DO  finasteride (PROSCAR) 5 MG tablet Take 5 mg by mouth daily. 12/02/19   [provider]  fluticasone (FLONASE) 50 MCG/ACT nasal spray Place 1 spray into both nostrils as needed for allergies or rhinitis.    [provider]  ibuprofen (ADVIL) 400 MG tablet Take 400 mg by mouth as needed.    [provider]  latanoprost (XALATAN) 0.005 % ophthalmic solution INSTILL 1 DROP INTO  BOTH EYES AT BEDTIME Patient taking differently: Place 1 drop into both eyes daily.  10/28/18   Briscoe Deutscher, DO  losartan (COZAAR) 25 MG tablet TAKE 1 TABLET BY MOUTH EVERY DAY 11/21/19   Leamon Arnt, MD  montelukast (SINGULAIR) 10 MG tablet Take 1 tablet (10 mg total) by mouth at bedtime. TAKE 1 TABLET BY MOUTH EVERY DAY 08/29/19   Leamon Arnt, MD  rosuvastatin (CRESTOR) 20 MG tablet Take 1 tablet (20 mg total) by mouth daily. 01/10/20   Leamon Arnt, MD  Syringe/Needle, Disp, (SYRINGE 3CC/25GX1") 25G X 1" 3 ML MISC 1 application by Does not apply route once a week. 04/18/19   Briscoe Deutscher, DO    Allergies:   Tamsulosin, Cipro [ciprofloxacin hcl], Lipitor [atorvastatin], and Lisinopril   Social History   Socioeconomic History  . Marital status: Married    Spouse name: Not on file  . Number of children: Not on file  . Years of education: Not on file  . Highest education level: Not on file  Occupational History  . Not on file  Tobacco Use  . Smoking status: Former Smoker    Types: Cigarettes    Quit date: 08/18/1965    Years since quitting: 54.4  . Smokeless tobacco: Never Used  . Tobacco comment: pt quit smoking 53 yrs ago  Substance and Sexual Activity  . Alcohol use: Yes    Alcohol/week: 2.0 - 6.0 standard drinks    Types: 2 - 6 Cans of beer per week  . Drug use: No  . Sexual activity: Yes  Other Topics Concern  . Not on file  Social History Narrative  . Not on file   Social Determinants of Health   Financial Resource Strain:   . Difficulty of Paying Living Expenses:   Food Insecurity:   . Worried About Charity fundraiser in the Last Year:   . Arboriculturist in the Last Year:   Transportation Needs:   . Film/video editor (Medical):   Marland Kitchen Lack of Transportation (Non-Medical):   Physical Activity:   . Days of Exercise per Week:   . Minutes of Exercise per Session:   Stress:   . Feeling of Stress :   Social Connections:   . Frequency of Communication  with Friends and Family:   . Frequency of Social Gatherings with Friends and Family:   . Attends Religious Services:   . Active Member of Clubs or Organizations:   . Attends Archivist Meetings:   Marland Kitchen Marital Status:      Family History:  The patient's family history includes COPD in his father; Diabetes in his mother; Stroke in his paternal grandfather.   ROS:   Please see the history of present illness.    ROS All other systems reviewed and are negative.   PHYSICAL EXAM:   VS:  BP (!) 156/82   Pulse 94   Ht 5' 10.5" (1.791 m)   Wt 249 lb (112.9 kg)  SpO2 96%   BMI 35.22 kg/m    GEN: Well nourished, well developed, in no acute distress  HEENT: normal  Neck: no JVD, carotid bruits, or masses Cardiac: RRR; 2/6 SE murmurs, rubs, or gallops,no edema  Respiratory:  clear to auscultation bilaterally, normal work of breathing GI: soft, nontender, nondistended, + BS MS: no deformity or atrophy  Skin: warm and dry, no rash Neuro:  Alert and Oriented x 3, Strength and sensation are intact Psych: euthymic mood, full affect  Wt Readings from Last 3 Encounters:  01/19/20 249 lb (112.9 kg)  01/09/20 248 lb 9.6 oz (112.8 kg)  05/13/19 245 lb (111.1 kg)      Studies/Labs Reviewed:   EKG:  EKG is ordered today.  The ekg ordered today demonstrates SR with PVCS  Recent Labs: 04/05/2019: Magnesium 2.1 01/09/2020: ALT 18; BUN 19; Creatinine, Ser 0.95; Hemoglobin 14.1; Platelets 198.0; Potassium 3.9; Sodium 138; TSH 2.05   Lipid Panel    Component Value Date/Time   CHOL 218 (H) 01/09/2020 1332   TRIG 214.0 (H) 01/09/2020 1332   HDL 46.50 01/09/2020 1332   CHOLHDL 5 01/09/2020 1332   VLDL 42.8 (H) 01/09/2020 1332   LDLCALC 104 (H) 03/01/2019 0449   LDLDIRECT 139.0 01/09/2020 1332    Additional studies/ records that were reviewed today include:   Echocardiogram: 02/2019 1. The left ventricle has normal systolic function with an ejection  fraction of 60-65%. The cavity  size was normal. There is moderately  increased left ventricular wall thickness. Left ventricular diastolic  parameters were normal. No evidence of left  ventricular regional wall motion abnormalities.  2. The right ventricle has normal systolic function. The cavity was  normal. There is no increase in right ventricular wall thickness.  3. The mitral valve is abnormal. Mild thickening of the mitral valve  leaflet. There is mild mitral annular calcification present.  4. The tricuspid valve is grossly normal.  5. The aortic valve is abnormal. Moderate calcification of the aortic  valve. Mild stenosis of the aortic valve.   SUMMARY    LVEF 60-65%, moderate LVH, normal wall motion, normal diastolic  function, normal biatrial size, mild MAC, calcified aortic valve  with mild aortic stenosis (AVA around 2.0 cm2), normal IVC     ASSESSMENT & PLAN:    1. Mild aortic stenosis Echocardiogram September 2020 showed mild aortic stenosis.  Asymptomatic.  Recommended echocardiogram every 3 years or when symptomatic.  He we will follow this with his primary care provider.  2.  PVC Longstanding history and stable.  3.  Surgical clearance for right knee surgery -Patient is easily getting greater than 4 METS of activity.  Given past medical history and time since last visit, based on ACC/AHA guidelines, Eric Stokes would be at acceptable risk for the planned procedure without further cardiovascular testing. Can hold ASA for 7 days if needed.   I will route this recommendation to the requesting party via Epic fax function and remove from pre-op pool.  Please call with questions.  IF he required surgical clearance for prostate cancer in the future he should be able to clear after phone call.  4. HTN - BP stable on current medications.   Medication Adjustments/Labs and Tests Ordered: Current medicines are reviewed at length with the patient today.  Concerns regarding medicines are  outlined above.  Medication changes, Labs and Tests ordered today are listed in the Patient Instructions below. Patient Instructions  Medication Instructions:  Your physician recommends that  you continue on your current medications as directed. Please refer to the Current Medication list given to you today.  *If you need a refill on your cardiac medications before your next appointment, please call your pharmacy*   Lab Work: None ordered  If you have labs (blood work) drawn today and your tests are completely normal, you will receive your results only by: Marland Kitchen MyChart Message (if you have MyChart) OR . A paper copy in the mail If you have any lab test that is abnormal or we need to change your treatment, we will call you to review the results.   Testing/Procedures: None ordered   Follow-Up: At [facility], you and your health needs are our priority.  As part of our continuing mission to provide you with exceptional heart care, we have created designated Provider Care Teams.  These Care Teams include your primary Cardiologist (physician) and Advanced Practice Providers (APPs -  Physician Assistants and Nurse Practitioners) who all work together to provide you with the care you need, when you need it.  We recommend signing up for the patient portal called "MyChart".  Sign up information is provided on this After Visit Summary.  MyChart is used to connect with patients for Virtual Visits (Telemedicine).  Patients are able to view lab/test results, encounter notes, upcoming appointments, etc.  Non-urgent messages can be sent to your provider as well.   To learn more about what you can do with MyChart, go to [email or web address].    Your next appointment:   AS NEEDED       Eric Soho, PA  01/19/2020 3:07 PM    [facility] [place], [place], [place]  [address] Phone: [phone]; Fax: [phone]

## 2020-01-19 NOTE — Telephone Encounter (Signed)
   Primary Cardiologist: Dr. Burt Knack  Chart reviewed as part of pre-operative protocol coverage. Given past medical history and time since last visit, based on ACC/AHA guidelines, Eric Stokes would be at acceptable risk for the planned procedure without further cardiovascular testing. He can hold Aspirin for 7 days for surgery if needed.   I will route this recommendation to the requesting party via Epic fax function and remove from pre-op pool.  Please call with questions.  [place], [place] 01/19/2020, 3:11 PM

## 2020-01-19 NOTE — Telephone Encounter (Signed)
   [facility] Pre-operative Risk Assessment    HEARTCARE STAFF: - Please ensure there is not already an duplicate clearance open for this procedure. - Under Visit Info/Reason for Call, type in Other and utilize the format Clearance MM/DD/YY or Clearance TBD. Do not use dashes or single digits. - If request is for dental extraction, please clarify the # of teeth to be extracted.  Request for surgical clearance:  1. What type of surgery is being performed? RIGHT TOTAL KNEE ARTHROPLASTY   2. When is this surgery scheduled? TBD   3. What type of clearance is required (medical clearance vs. Pharmacy clearance to hold med vs. Both)? MEDICAL  4. Are there any medications that need to be held prior to surgery and how long? ASA   5. Practice name and name of physician performing surgery?  [facility]; DR. Lyla Glassing   6. What is the office phone number? [phone]   7.   What is the office fax number? [phone]  8.   Anesthesia type (None, local, MAC, general) ? SPINAL   Jacinta Shoe 01/19/2020, 2:57 PM  _________________________________________________________________   (provider comments below)

## 2020-01-19 NOTE — Patient Instructions (Signed)
Medication Instructions:  °Your physician recommends that you continue on your current medications as directed. Please refer to the Current Medication list given to you today. ° °*If you need a refill on your cardiac medications before your next appointment, please call your pharmacy* ° ° °Lab Work: °None ordered ° °If you have labs (blood work) drawn today and your tests are completely normal, you will receive your results only by: °MyChart Message (if you have MyChart) OR °A paper copy in the mail °If you have any lab test that is abnormal or we need to change your treatment, we will call you to review the results. ° ° °Testing/Procedures: °None ordered ° ° °Follow-Up: °At [facility], you and your health needs are our priority.  As part of our continuing mission to provide you with exceptional heart care, we have created designated Provider Care Teams.  These Care Teams include your primary Cardiologist (physician) and Advanced Practice Providers (APPs -  Physician Assistants and Nurse Practitioners) who all work together to provide you with the care you need, when you need it. ° °We recommend signing up for the patient portal called "MyChart".  Sign up information is provided on this After Visit Summary.  MyChart is used to connect with patients for Virtual Visits (Telemedicine).  Patients are able to view lab/test results, encounter notes, upcoming appointments, etc.  Non-urgent messages can be sent to your provider as well.   °To learn more about what you can do with MyChart, go to [email or web address].   ° °Your next appointment:   °AS NEEDED °

## 2020-01-27 ENCOUNTER — Other Ambulatory Visit: Payer: Self-pay | Admitting: Family Medicine

## 2020-01-31 ENCOUNTER — Ambulatory Visit: Payer: Self-pay | Admitting: Orthopedic Surgery

## 2020-02-13 ENCOUNTER — Encounter: Payer: Medicare Other | Admitting: Family Medicine

## 2020-02-16 ENCOUNTER — Ambulatory Visit (INDEPENDENT_AMBULATORY_CARE_PROVIDER_SITE_OTHER): Payer: Medicare Other

## 2020-02-16 ENCOUNTER — Other Ambulatory Visit: Payer: Self-pay

## 2020-02-16 DIAGNOSIS — E538 Deficiency of other specified B group vitamins: Secondary | ICD-10-CM | POA: Diagnosis not present

## 2020-02-16 MED ORDER — CYANOCOBALAMIN 1000 MCG/ML IJ SOLN
1000.0000 ug | Freq: Once | INTRAMUSCULAR | Status: AC
  Administered 2020-02-16: 1000 ug via INTRAMUSCULAR

## 2020-02-16 NOTE — Progress Notes (Signed)
Per orders of Dr.Andy, injection of Vitamin b12 given by Loralyn Freshwater.Patient tolerated injection well.

## 2020-02-23 ENCOUNTER — Encounter (HOSPITAL_COMMUNITY): Payer: Self-pay

## 2020-02-23 NOTE — Patient Instructions (Addendum)
DUE TO COVID-19 ONLY ONE VISITOR ARE ALLOWED TO COME WITH YOU AND STAY IN THE WAITING ROOM ONLY DURING PRE OP AND PROCEDURE. THEN TWO VISITORS MAY VISIT WITH YOU IN YOUR PRIVATE ROOM DURING VISITING HOURS ONLY!!   COVID SWAB TESTING MUST BE COMPLETED ON:  Saturday, March 10, 2020 at 9:00 Am [address] line (Must self quarantine after testing. Follow instructions on handout.)             Your procedure is scheduled on: Wednesday, March 14, 2020   Report to [facility] Main  Entrance    Report to admitting at 8:25 AM   Call this number if you have problems the morning of surgery [phone]   Do not eat food :After Midnight.   May have liquids until 7:45 AM   day of surgery   CLEAR LIQUID DIET  Foods Allowed                                                                     Foods Excluded  Water, Black Coffee and tea, regular and decaf                             liquids that you cannot  Plain Jell-O in any flavor  (No red)                                           see through such as: Fruit ices (not with fruit pulp)                                     milk, soups, orange juice  Iced Popsicles (No red)                                    All solid food                                   Apple juices Sports drinks like Gatorade (No red) Lightly seasoned clear broth or consume(fat free) Sugar, honey syrup  Sample Menu Breakfast                                Lunch                                     Supper Cranberry juice                    Beef broth                            Chicken broth Jell-O  Grape juice                           Apple juice Coffee or tea                        Jell-O                                      Popsicle                                                Coffee or tea                        Coffee or tea   Complete one Ensure drink the  morning of surgery at  7:45AM the day of surgery.   Oral Hygiene is also important to reduce your risk of infection.                                    Remember - BRUSH YOUR TEETH THE MORNING OF SURGERY WITH YOUR REGULAR TOOTHPASTE   Do NOT smoke after Midnight   Take these medicines the morning of surgery with A SIP OF WATER: Alfuzosin   Use Inhaler and eye drops morning of surgery   May use Flonase if needed day of surgery   Bring CPAP mask and tubing day of surgery                               You may not have any metal on your body including  jewelry, and body piercings             Do not wear lotions, powders, perfumes/cologne, or deodorant                          Men may shave face and neck.   Do not bring valuables to the hospital. [place].   Contacts, dentures or bridgework may not be worn into surgery.   Bring small overnight bag day of surgery.    Patients discharged the day of surgery will not be allowed to drive home.   Special Instructions: Bring a copy of your healthcare power of attorney and living will documents         the day of surgery if you haven't scanned them in before.              Please read over the following fact sheets you were given: IF YOU HAVE QUESTIONS ABOUT YOUR PRE OP INSTRUCTIONS PLEASE CALL [phone]   [place] - Preparing for Surgery Before surgery, you can play an important role.  Because skin is not sterile, your skin needs to be as free of germs as possible.  You can reduce the number of germs on your skin by washing with CHG (chlorahexidine gluconate) soap before surgery.  CHG is an antiseptic cleaner which kills germs and bonds  with the skin to continue killing germs even after washing. Please DO NOT use if you have an allergy to CHG or antibacterial soaps.  If your skin becomes reddened/irritated stop using the CHG and inform your nurse when you arrive at Short Stay. Do not  shave (including legs and underarms) for at least 48 hours prior to the first CHG shower.  You may shave your face/neck.  Please follow these instructions carefully:  1.  Shower with CHG Soap the night before surgery and the  morning of surgery.  2.  If you choose to wash your hair, wash your hair first as usual with your normal  shampoo.  3.  After you shampoo, rinse your hair and body thoroughly to remove the shampoo.                             4.  Use CHG as you would any other liquid soap.  You can apply chg directly to the skin and wash.  Gently with a scrungie or clean washcloth.  5.  Apply the CHG Soap to your body ONLY FROM THE NECK DOWN.   Do   not use on face/ open                           Wound or open sores. Avoid contact with eyes, ears mouth and   genitals (private parts).                       Wash face,  Genitals (private parts) with your normal soap.             6.  Wash thoroughly, paying special attention to the area where your    surgery  will be performed.  7.  Thoroughly rinse your body with warm water from the neck down.  8.  DO NOT shower/wash with your normal soap after using and rinsing off the CHG Soap.                9.  Pat yourself dry with a clean towel.            10.  Wear clean pajamas.            11.  Place clean sheets on your bed the night of your first shower and do not  sleep with pets. Day of Surgery : Do not apply any lotions/deodorants the morning of surgery.  Please wear clean clothes to the hospital/surgery center.  FAILURE TO FOLLOW THESE INSTRUCTIONS MAY RESULT IN THE CANCELLATION OF YOUR SURGERY  PATIENT SIGNATURE_________________________________  NURSE SIGNATURE__________________________________  ________________________________________________________________________   Eric Stokes  An incentive spirometer is a tool that can help keep your lungs clear and active. This tool measures how well you are filling your lungs with each  breath. Taking long deep breaths may help reverse or decrease the chance of developing breathing (pulmonary) problems (especially infection) following:  A long period of time when you are unable to move or be active. BEFORE THE PROCEDURE   If the spirometer includes an indicator to show your best effort, your nurse or respiratory therapist will set it to a desired goal.  If possible, sit up straight or lean slightly forward. Try not to slouch.  Hold the incentive spirometer in an upright position. INSTRUCTIONS FOR USE  1. Sit on the edge of your bed if possible, or  sit up as far as you can in bed or on a chair. 2. Hold the incentive spirometer in an upright position. 3. Breathe out normally. 4. Place the mouthpiece in your mouth and seal your lips tightly around it. 5. Breathe in slowly and as deeply as possible, raising the piston or the ball toward the top of the column. 6. Hold your breath for 3-5 seconds or for as long as possible. Allow the piston or ball to fall to the bottom of the column. 7. Remove the mouthpiece from your mouth and breathe out normally. 8. Rest for a few seconds and repeat Steps 1 through 7 at least 10 times every 1-2 hours when you are awake. Take your time and take a few normal breaths between deep breaths. 9. The spirometer may include an indicator to show your best effort. Use the indicator as a goal to work toward during each repetition. 10. After each set of 10 deep breaths, practice coughing to be sure your lungs are clear. If you have an incision (the cut made at the time of surgery), support your incision when coughing by placing a pillow or rolled up towels firmly against it. Once you are able to get out of bed, walk around indoors and cough well. You may stop using the incentive spirometer when instructed by your caregiver.  RISKS AND COMPLICATIONS  Take your time so you do not get dizzy or light-headed.  If you are in pain, you may need to take or ask  for pain medication before doing incentive spirometry. It is harder to take a deep breath if you are having pain. AFTER USE  Rest and breathe slowly and easily.  It can be helpful to keep track of a log of your progress. Your caregiver can provide you with a simple table to help with this. If you are using the spirometer at home, follow these instructions: [place] IF:   You are having difficultly using the spirometer.  You have trouble using the spirometer as often as instructed.  Your pain medication is not giving enough relief while using the spirometer.  You develop fever of 100.5 F (38.1 C) or higher. SEEK IMMEDIATE MEDICAL CARE IF:   You cough up bloody sputum that had not been present before.  You develop fever of 102 F (38.9 C) or greater.  You develop worsening pain at or near the incision site. MAKE SURE YOU:   Understand these instructions.  Will watch your condition.  Will get help right away if you are not doing well or get worse. Document Released: 12/15/2006 Document Revised: 10/27/2011 Document Reviewed: 02/15/2007 ExitCare Patient Information 2014 ExitCare, [place].   ________________________________________________________________________  WHAT IS A BLOOD TRANSFUSION? Blood Transfusion Information  A transfusion is the replacement of blood or some of its parts. Blood is made up of multiple cells which provide different functions.  Red blood cells carry oxygen and are used for blood loss replacement.  White blood cells fight against infection.  Platelets control bleeding.  Plasma helps clot blood.  Other blood products are available for specialized needs, such as hemophilia or other clotting disorders. BEFORE THE TRANSFUSION  Who gives blood for transfusions?   Healthy volunteers who are fully evaluated to make sure their blood is safe. This is blood bank blood. Transfusion therapy is the safest it has ever been in the practice of  medicine. Before blood is taken from a donor, a complete history is taken to make sure that person  has no history of diseases nor engages in risky social behavior (examples are intravenous drug use or sexual activity with multiple partners). The donor's travel history is screened to minimize risk of transmitting infections, such as malaria. The donated blood is tested for signs of infectious diseases, such as HIV and hepatitis. The blood is then tested to be sure it is compatible with you in order to minimize the chance of a transfusion reaction. If you or a relative donates blood, this is often done in anticipation of surgery and is not appropriate for emergency situations. It takes many days to process the donated blood. RISKS AND COMPLICATIONS Although transfusion therapy is very safe and saves many lives, the main dangers of transfusion include:   Getting an infectious disease.  Developing a transfusion reaction. This is an allergic reaction to something in the blood you were given. Every precaution is taken to prevent this. The decision to have a blood transfusion has been considered carefully by your caregiver before blood is given. Blood is not given unless the benefits outweigh the risks. AFTER THE TRANSFUSION  Right after receiving a blood transfusion, you will usually feel much better and more energetic. This is especially true if your red blood cells have gotten low (anemic). The transfusion raises the level of the red blood cells which carry oxygen, and this usually causes an energy increase.  The nurse administering the transfusion will monitor you carefully for complications. HOME CARE INSTRUCTIONS  No special instructions are needed after a transfusion. You may find your energy is better. Speak with your caregiver about any limitations on activity for underlying diseases you may have. SEEK MEDICAL CARE IF:   Your condition is not improving after your transfusion.  You develop  redness or irritation at the intravenous (IV) site. SEEK IMMEDIATE MEDICAL CARE IF:  Any of the following symptoms occur over the next 12 hours:  Shaking chills.  You have a temperature by mouth above 102 F (38.9 C), not controlled by medicine.  Chest, back, or muscle pain.  People around you feel you are not acting correctly or are confused.  Shortness of breath or difficulty breathing.  Dizziness and fainting.  You get a rash or develop hives.  You have a decrease in urine output.  Your urine turns a dark color or changes to pink, red, or brown. Any of the following symptoms occur over the next 10 days:  You have a temperature by mouth above 102 F (38.9 C), not controlled by medicine.  Shortness of breath.  Weakness after normal activity.  The white part of the eye turns yellow (jaundice).  You have a decrease in the amount of urine or are urinating less often.  Your urine turns a dark color or changes to pink, red, or brown. Document Released: 08/01/2000 Document Revised: 10/27/2011 Document Reviewed: 03/20/2008 [facility] Patient Information 2014 [place], [place].  _______________________________________________________________________

## 2020-02-23 NOTE — Progress Notes (Addendum)
COVID Vaccine Completed: Yes Date COVID Vaccine completed: 10/20/2019 COVID vaccine manufacturer: [employer]      PCP - Dr. Cathlean Marseilles last office visit and pre op exam 01/09/2020 in epic Cardiologist - Dr. Burt Knack., Last office visit and pre op exam 01/19/2020 in epic   Chest x-ray - greater than 1 year EKG - 01/19/2020 in epic Stress Test - greater than 2 years ECHO - 03/01/2019 in epic Cardiac Cath - N/A  Sleep Study - Yes CPAP - Yes  Fasting Blood Sugar - N/A Checks Blood Sugar ___N/A__ times a day  Blood Thinner Instructions: N/A Aspirin Instructions: (7days if needed per note) Last Dose: pt aware of when to stop aspirin for procedure  Anesthesia review: COPD, OSA, HTN  Patient denies shortness of breath, fever, cough and chest pain at PAT appointment   Patient verbalized understanding of instructions that were given to them at the PAT appointment. Patient was also instructed that they will need to review over the PAT instructions again at home before surgery.

## 2020-02-27 ENCOUNTER — Encounter (HOSPITAL_COMMUNITY)
Admission: RE | Admit: 2020-02-27 | Discharge: 2020-02-27 | Disposition: A | Discharge status: Home / Self Care | Payer: No Typology Code available for payment source | Source: Ambulatory Visit | Attending: Orthopedic Surgery | Admitting: Orthopedic Surgery

## 2020-02-27 ENCOUNTER — Other Ambulatory Visit: Payer: Self-pay

## 2020-02-27 ENCOUNTER — Ambulatory Visit: Payer: Self-pay | Admitting: Student

## 2020-02-27 ENCOUNTER — Encounter (HOSPITAL_COMMUNITY): Payer: Self-pay

## 2020-02-27 DIAGNOSIS — Z01812 Encounter for preprocedural laboratory examination: Secondary | ICD-10-CM | POA: Insufficient documentation

## 2020-02-27 HISTORY — DX: Other seasonal allergic rhinitis: J30.2

## 2020-02-27 HISTORY — DX: Bradycardia, unspecified: R00.1

## 2020-02-27 HISTORY — DX: Transient cerebral ischemic attack, unspecified: G45.9

## 2020-02-27 HISTORY — DX: Ventricular premature depolarization: I49.3

## 2020-02-27 HISTORY — DX: Nonrheumatic aortic (valve) stenosis: I35.0

## 2020-02-27 HISTORY — DX: Unspecified osteoarthritis, unspecified site: M19.90

## 2020-02-27 HISTORY — DX: Deficiency of other specified B group vitamins: E53.8

## 2020-02-27 HISTORY — DX: Squamous cell carcinoma of skin, unspecified: C44.92

## 2020-02-27 HISTORY — DX: Unspecified asthma, uncomplicated: J45.909

## 2020-02-27 HISTORY — DX: Obesity, unspecified: E66.9

## 2020-02-27 HISTORY — DX: Personal history of other diseases of the nervous system and sense organs: Z86.69

## 2020-02-27 LAB — URINALYSIS, ROUTINE W REFLEX MICROSCOPIC
Bilirubin Urine: NEGATIVE
Glucose, UA: NEGATIVE mg/dL
Hgb urine dipstick: NEGATIVE
Ketones, ur: NEGATIVE mg/dL
Leukocytes,Ua: NEGATIVE
Nitrite: NEGATIVE
Protein, ur: NEGATIVE mg/dL
Specific Gravity, Urine: 1.006 (ref 1.005–1.030)
pH: 6 (ref 5.0–8.0)

## 2020-02-27 LAB — COMPREHENSIVE METABOLIC PANEL
ALT: 21 U/L (ref 0–44)
AST: 21 U/L (ref 15–41)
Albumin: 4.2 g/dL (ref 3.5–5.0)
Alkaline Phosphatase: 45 U/L (ref 38–126)
Anion gap: 7 (ref 5–15)
BUN: 20 mg/dL (ref 8–23)
CO2: 25 mmol/L (ref 22–32)
Calcium: 8.8 mg/dL — ABNORMAL LOW (ref 8.9–10.3)
Chloride: 106 mmol/L (ref 98–111)
Creatinine, Ser: 0.83 mg/dL (ref 0.61–1.24)
GFR calc Af Amer: >60 mL/min (ref >60)
GFR calc non Af Amer: >60 mL/min (ref >60)
Glucose, Bld: 108 mg/dL — ABNORMAL HIGH (ref 70–99)
Potassium: 4.4 mmol/L (ref 3.5–5.1)
Sodium: 138 mmol/L (ref 135–145)
Total Bilirubin: 1.2 mg/dL (ref 0.3–1.2)
Total Protein: 7.4 g/dL (ref 6.5–8.1)

## 2020-02-27 LAB — SURGICAL PCR SCREEN
MRSA, PCR: NEGATIVE
Staphylococcus aureus: NEGATIVE

## 2020-02-27 LAB — CBC
HCT: 41.9 % (ref 39.0–52.0)
Hemoglobin: 14.2 g/dL (ref 13.0–17.0)
MCH: 30.5 pg (ref 26.0–34.0)
MCHC: 33.9 g/dL (ref 30.0–36.0)
MCV: 89.9 fL (ref 80.0–100.0)
Platelets: 200 10*3/uL (ref 150–400)
RBC: 4.66 MIL/uL (ref 4.22–5.81)
RDW: 14.5 % (ref 11.5–15.5)
WBC: 5.8 10*3/uL (ref 4.0–10.5)
nRBC: 0 % (ref 0.0–0.2)

## 2020-02-27 LAB — PROTIME-INR
INR: 1.1 (ref 0.8–1.2)
Prothrombin Time: 13.3 seconds (ref 11.4–15.2)

## 2020-02-27 NOTE — H&P (Signed)
TOTAL KNEE ADMISSION H&P  Patient is being admitted for right total knee arthroplasty.  Subjective:  Chief Complaint:right knee pain.  HPI: Eric Stokes, 74 y.o. male, has a history of pain and functional disability in the right knee due to arthritis and has failed non-surgical conservative treatments for greater than 12 weeks to includeNSAID's and/or analgesics, corticosteriod injections, viscosupplementation injections and activity modification.  Onset of symptoms was gradual, starting 4 years ago with gradually worsening course since that time. The patient noted no past surgery on the right knee(s).  Patient currently rates pain in the right knee(s) at 8 out of 10 with activity. Patient has worsening of pain with activity and weight bearing, pain that interferes with activities of daily living and joint swelling.  Patient has evidence of joint space narrowing by imaging studies. There is no active infection.  Patient Active Problem List   Diagnosis Date Noted  . Colonic polyp 01/09/2020  . Prostate cancer ([place]) 11/30/2019  . Lymphadenopathy 11/30/2019  . Obesity (BMI 30.0-34.9) 04/08/2019  . TIA (transient ischemic attack) 02/28/2019  . At risk for bradycardia 06/15/2018  . B12 deficiency, with monthly injections 06/15/2018  . Primary osteoarthritis of right knee, followed by Orthopedics 04/12/2018  . Asthma, allergic, prn albuterol, triggered by perfumes 03/19/2018  . OSA on CPAP 09/25/2015  . Hyperplasia of prostate with lower urinary tract symptoms (LUTS) 09/03/2012  . Vitamin D deficiency 11/26/2010  . Allergic rhinitis, using Flonase 11/20/2010  . Hearing loss 11/20/2010  . Dyslipidemia, on Pravastatin 12/21/2008  . Benign essential hypertension 12/21/2008   Past Medical History:  Diagnosis Date  . Aortic stenosis, mild    noted on ECHO  . Asthma   . Benign essential hypertension, on Norvasc and Amlodipine 12/21/2008   Followed by [facility]   . BPH (benign  prostatic hyperplasia) 09/03/2012  . Carotid bruit present 06/15/2018   2015 Carotid Dopplers:  Essentially normal carotid arteries, with very slight hard plaque in the proximal ICA's and serpentine distal vessels. 1-39% bilateral ICA stenosis. Patent vertebral arteries with antegrade flow. Normal subclavian arteries, bilaterally.  . Colonic polyp 01/09/2020  . COPD (chronic obstructive pulmonary disease) ([place])   . Dyslipidemia 12/21/2008   Qualifier: Diagnosis of  By: Percival Spanish, MD, Farrel Gordon    . Essential hypertension, benign 12/21/2008   Qualifier: Diagnosis of  By: Percival Spanish, MD, Farrel Gordon    . Former smoker, stopped smoking in distant past 03/15/2018  . Hematuria 09/16/2011  . History of migraine   . OA (osteoarthritis)   . Obese   . OSA on CPAP 09/25/2015   Diagnosed at the [facility]   . Prostate cancer ([place]) 11/30/2019  . PVC's (premature ventricular contractions)   . Seasonal allergies   . Sinus bradycardia   . Squamous cell carcinoma of skin   . TIA (transient ischemic attack)    questionable  . Vitamin B 12 deficiency   . Vitamin D deficiency 11/26/2010    Past Surgical History:  Procedure Laterality Date  . CHOLECYSTECTOMY  2001  . COLONOSCOPY    . TONSILLECTOMY AND ADENOIDECTOMY  1952    Current Outpatient Medications  Medication Sig Dispense Refill Last Dose  . albuterol (VENTOLIN HFA) 108 (90 Base) MCG/ACT inhaler Inhale 2 puffs into the lungs every 6 (six) hours as needed for wheezing or shortness of breath.      . alfuzosin (UROXATRAL) 10 MG 24 hr tablet Take 5 mg by mouth in the morning and at bedtime.     Marland Kitchen  aspirin 81 MG EC tablet Take 81 mg by mouth daily.      . carboxymethylcellulose (REFRESH PLUS) 0.5 % SOLN Place 1 drop into both eyes 2 (two) times daily as needed (dry eyes).     . cetirizine (ZYRTEC) 10 MG tablet Take 10 mg by mouth daily.     . Cholecalciferol (VITAMIN D3) 2000 UNITS TABS Take 2,000 Int'l Units by mouth daily.      .  cyanocobalamin (,VITAMIN B-12,) 1000 MCG/ML injection 1000 MCG (1 MG) INJECTION ONCE PER WEEK FOR FOUR WEEKS, FOLLOWED BY 1000 MCG INJECTION ONCE PER MONTH. (Patient taking differently: Inject 1,000 mcg into the muscle every 30 (thirty) days. ) 12 mL 1   . finasteride (PROSCAR) 5 MG tablet Take 5 mg by mouth daily.     . fluticasone (FLONASE) 50 MCG/ACT nasal spray Place 1 spray into both nostrils daily as needed for allergies or rhinitis.      Marland Kitchen ibuprofen (ADVIL) 400 MG tablet Take 400 mg by mouth every 6 (six) hours as needed for moderate pain.      Marland Kitchen latanoprost (XALATAN) 0.005 % ophthalmic solution INSTILL 1 DROP INTO BOTH EYES AT BEDTIME (Patient taking differently: Place 1 drop into both eyes in the morning. ) 7.5 mL 5   . losartan (COZAAR) 25 MG tablet TAKE 1 TABLET BY MOUTH EVERY DAY (Patient taking differently: Take 25 mg by mouth daily. ) 90 tablet 0   . montelukast (SINGULAIR) 10 MG tablet Take 1 tablet (10 mg total) by mouth at bedtime. TAKE 1 TABLET BY MOUTH EVERY DAY (Patient taking differently: Take 10 mg by mouth at bedtime. ) 90 tablet 0   . NON FORMULARY Cpap at night     . rosuvastatin (CRESTOR) 20 MG tablet Take 1 tablet (20 mg total) by mouth daily. 90 tablet 3   . Syringe/Needle, Disp, (SYRINGE 3CC/25GX1") 25G X 1" 3 ML MISC 1 application by Does not apply route once a week. 12 each 0    No current facility-administered medications for this visit.   Allergies  Allergen Reactions  . Tamsulosin Other (See Comments)    dizziness  . Cipro [Ciprofloxacin Hcl] Swelling    Knee swelling, joint pain  . Lipitor [Atorvastatin] Other (See Comments)    Leg Cramps  . Lisinopril Cough    Social History   Tobacco Use  . Smoking status: Former Smoker    Types: Cigarettes    Quit date: 08/18/1965    Years since quitting: 54.5  . Smokeless tobacco: Never Used  . Tobacco comment: pt quit smoking 53 yrs ago  Substance Use Topics  . Alcohol use: Yes    Alcohol/week: 2.0 - 6.0  standard drinks    Types: 2 - 6 Cans of beer per week    Family History  Problem Relation Age of Onset  . Diabetes Mother   . COPD Father   . Stroke Paternal Grandfather      Review of Systems  Constitutional: Negative.   HENT: Negative.   Respiratory: Negative.   Cardiovascular: Negative.   Gastrointestinal: Negative.   Endocrine: Negative.   Musculoskeletal: Positive for arthralgias.  Skin: Negative.   Allergic/Immunologic: Negative.   Neurological: Negative.   Psychiatric/Behavioral: Negative.     Objective:  Physical Exam Constitutional:      Appearance: Normal appearance.  HENT:     Head: Normocephalic and atraumatic.  Eyes:     Extraocular Movements: Extraocular movements intact.     Pupils: Pupils are  equal, round, and reactive to light.  Cardiovascular:     Rate and Rhythm: Normal rate. Rhythm irregular.     Heart sounds: No murmur heard.  No friction rub. No gallop.   Pulmonary:     Breath sounds: Normal breath sounds. No wheezing, rhonchi or rales.  Abdominal:     Palpations: Abdomen is soft.     Tenderness: There is no abdominal tenderness.  Genitourinary:    Comments: Deferred Musculoskeletal:     Cervical back: Normal range of motion and neck supple.     Comments: Examination the right knee reveals no skin wounds or lesions. He has a varus deformity. He has some swelling. No erythema or effusion. He has tenderness to palpation medial joint line parapatellar retinacular tissues with a positive grind sign. Good range of motion. No ligament instability. Painless range of motion of the hip.  Skin:    General: Skin is warm and dry.  Neurological:     General: No focal deficit present.     Mental Status: He is alert and oriented to person, place, and time.  Psychiatric:        Mood and Affect: Mood normal.     Vital signs in last 24 hours: @VSRANGES @  Labs:   Estimated body mass index is 36.16 kg/m as calculated from the following:   Height as  of an earlier encounter on 02/27/20: 5\' 10"  (1.778 m).   Weight as of an earlier encounter on 02/27/20: 114.3 kg.   Imaging Review Plain radiographs demonstrate severe degenerative joint disease of the right knee(s). The bone quality appears to be adequate for age and reported activity level.      Assessment/Plan:  End stage arthritis, right knee   The patient history, physical examination, clinical judgment of the provider and imaging studies are consistent with end stage degenerative joint disease of the right knee(s) and total knee arthroplasty is deemed medically necessary. The treatment options including medical management, injection therapy arthroscopy and arthroplasty were discussed at length. The risks and benefits of total knee arthroplasty were presented and reviewed. The risks due to aseptic loosening, infection, stiffness, patella tracking problems, thromboembolic complications and other imponderables were discussed. The patient acknowledged the explanation, agreed to proceed with the plan and consent was signed. Patient is being admitted for inpatient treatment for surgery, pain control, PT, OT, prophylactic antibiotics, VTE prophylaxis, progressive ambulation and ADL's and discharge planning. The patient is planning to be discharged home with his wife. He will complete outpatient physical therapy.     Patient's anticipated LOS is less than 2 midnights, meeting these requirements: - Lives within 1 hour of care - Has a competent adult at home to recover with post-op recover - NO history of  - Chronic pain requiring opiods  - Diabetes  - Coronary Artery Disease  - Heart failure  - Heart attack  - Stroke  - DVT/VTE  - Respiratory Failure/COPD  - Renal failure  - Anemia  - Advanced Liver disease

## 2020-02-27 NOTE — H&P (View-Only) (Signed)
TOTAL KNEE ADMISSION H&P  Patient is being admitted for right total knee arthroplasty.  Subjective:  Chief Complaint:right knee pain.  HPI: Eric Stokes, 74 y.o. male, has a history of pain and functional disability in the right knee due to arthritis and has failed non-surgical conservative treatments for greater than 12 weeks to includeNSAID's and/or analgesics, corticosteriod injections, viscosupplementation injections and activity modification.  Onset of symptoms was gradual, starting 4 years ago with gradually worsening course since that time. The patient noted no past surgery on the right knee(s).  Patient currently rates pain in the right knee(s) at 8 out of 10 with activity. Patient has worsening of pain with activity and weight bearing, pain that interferes with activities of daily living and joint swelling.  Patient has evidence of joint space narrowing by imaging studies. There is no active infection.  Patient Active Problem List   Diagnosis Date Noted  . Colonic polyp 01/09/2020  . Prostate cancer ([place]) 11/30/2019  . Lymphadenopathy 11/30/2019  . Obesity (BMI 30.0-34.9) 04/08/2019  . TIA (transient ischemic attack) 02/28/2019  . At risk for bradycardia 06/15/2018  . B12 deficiency, with monthly injections 06/15/2018  . Primary osteoarthritis of right knee, followed by Orthopedics 04/12/2018  . Asthma, allergic, prn albuterol, triggered by perfumes 03/19/2018  . OSA on CPAP 09/25/2015  . Hyperplasia of prostate with lower urinary tract symptoms (LUTS) 09/03/2012  . Vitamin D deficiency 11/26/2010  . Allergic rhinitis, using Flonase 11/20/2010  . Hearing loss 11/20/2010  . Dyslipidemia, on Pravastatin 12/21/2008  . Benign essential hypertension 12/21/2008   Past Medical History:  Diagnosis Date  . Aortic stenosis, mild    noted on ECHO  . Asthma   . Benign essential hypertension, on Norvasc and Amlodipine 12/21/2008   Followed by [facility]   . BPH (benign  prostatic hyperplasia) 09/03/2012  . Carotid bruit present 06/15/2018   2015 Carotid Dopplers:  Essentially normal carotid arteries, with very slight hard plaque in the proximal ICA's and serpentine distal vessels. 1-39% bilateral ICA stenosis. Patent vertebral arteries with antegrade flow. Normal subclavian arteries, bilaterally.  . Colonic polyp 01/09/2020  . COPD (chronic obstructive pulmonary disease) ([place])   . Dyslipidemia 12/21/2008   Qualifier: Diagnosis of  By: Percival Spanish, MD, Farrel Gordon    . Essential hypertension, benign 12/21/2008   Qualifier: Diagnosis of  By: Percival Spanish, MD, Farrel Gordon    . Former smoker, stopped smoking in distant past 03/15/2018  . Hematuria 09/16/2011  . History of migraine   . OA (osteoarthritis)   . Obese   . OSA on CPAP 09/25/2015   Diagnosed at the [facility] 2016   . Prostate cancer ([place]) 11/30/2019  . PVC's (premature ventricular contractions)   . Seasonal allergies   . Sinus bradycardia   . Squamous cell carcinoma of skin   . TIA (transient ischemic attack)    questionable  . Vitamin B 12 deficiency   . Vitamin D deficiency 11/26/2010    Past Surgical History:  Procedure Laterality Date  . CHOLECYSTECTOMY  2001  . COLONOSCOPY    . TONSILLECTOMY AND ADENOIDECTOMY  1952    Current Outpatient Medications  Medication Sig Dispense Refill Last Dose  . albuterol (VENTOLIN HFA) 108 (90 Base) MCG/ACT inhaler Inhale 2 puffs into the lungs every 6 (six) hours as needed for wheezing or shortness of breath.      . alfuzosin (UROXATRAL) 10 MG 24 hr tablet Take 5 mg by mouth in the morning and at bedtime.     Marland Kitchen  aspirin 81 MG EC tablet Take 81 mg by mouth daily.      . carboxymethylcellulose (REFRESH PLUS) 0.5 % SOLN Place 1 drop into both eyes 2 (two) times daily as needed (dry eyes).     . cetirizine (ZYRTEC) 10 MG tablet Take 10 mg by mouth daily.     . Cholecalciferol (VITAMIN D3) 2000 UNITS TABS Take 2,000 Int'l Units by mouth daily.      .  cyanocobalamin (,VITAMIN B-12,) 1000 MCG/ML injection 1000 MCG (1 MG) INJECTION ONCE PER WEEK FOR FOUR WEEKS, FOLLOWED BY 1000 MCG INJECTION ONCE PER MONTH. (Patient taking differently: Inject 1,000 mcg into the muscle every 30 (thirty) days. ) 12 mL 1   . finasteride (PROSCAR) 5 MG tablet Take 5 mg by mouth daily.     . fluticasone (FLONASE) 50 MCG/ACT nasal spray Place 1 spray into both nostrils daily as needed for allergies or rhinitis.      Marland Kitchen ibuprofen (ADVIL) 400 MG tablet Take 400 mg by mouth every 6 (six) hours as needed for moderate pain.      Marland Kitchen latanoprost (XALATAN) 0.005 % ophthalmic solution INSTILL 1 DROP INTO BOTH EYES AT BEDTIME (Patient taking differently: Place 1 drop into both eyes in the morning. ) 7.5 mL 5   . losartan (COZAAR) 25 MG tablet TAKE 1 TABLET BY MOUTH EVERY DAY (Patient taking differently: Take 25 mg by mouth daily. ) 90 tablet 0   . montelukast (SINGULAIR) 10 MG tablet Take 1 tablet (10 mg total) by mouth at bedtime. TAKE 1 TABLET BY MOUTH EVERY DAY (Patient taking differently: Take 10 mg by mouth at bedtime. ) 90 tablet 0   . NON FORMULARY Cpap at night     . rosuvastatin (CRESTOR) 20 MG tablet Take 1 tablet (20 mg total) by mouth daily. 90 tablet 3   . Syringe/Needle, Disp, (SYRINGE 3CC/25GX1") 25G X 1" 3 ML MISC 1 application by Does not apply route once a week. 12 each 0    No current facility-administered medications for this visit.   Allergies  Allergen Reactions  . Tamsulosin Other (See Comments)    dizziness  . Cipro [Ciprofloxacin Hcl] Swelling    Knee swelling, joint pain  . Lipitor [Atorvastatin] Other (See Comments)    Leg Cramps  . Lisinopril Cough    Social History   Tobacco Use  . Smoking status: Former Smoker    Types: Cigarettes    Quit date: 08/18/1965    Years since quitting: 54.5  . Smokeless tobacco: Never Used  . Tobacco comment: pt quit smoking 53 yrs ago  Substance Use Topics  . Alcohol use: Yes    Alcohol/week: 2.0 - 6.0  standard drinks    Types: 2 - 6 Cans of beer per week    Family History  Problem Relation Age of Onset  . Diabetes Mother   . COPD Father   . Stroke Paternal Grandfather      Review of Systems  Constitutional: Negative.   HENT: Negative.   Respiratory: Negative.   Cardiovascular: Negative.   Gastrointestinal: Negative.   Endocrine: Negative.   Musculoskeletal: Positive for arthralgias.  Skin: Negative.   Allergic/Immunologic: Negative.   Neurological: Negative.   Psychiatric/Behavioral: Negative.     Objective:  Physical Exam Constitutional:      Appearance: Normal appearance.  HENT:     Head: Normocephalic and atraumatic.  Eyes:     Extraocular Movements: Extraocular movements intact.     Pupils: Pupils are  equal, round, and reactive to light.  Cardiovascular:     Rate and Rhythm: Normal rate. Rhythm irregular.     Heart sounds: No murmur heard.  No friction rub. No gallop.   Pulmonary:     Breath sounds: Normal breath sounds. No wheezing, rhonchi or rales.  Abdominal:     Palpations: Abdomen is soft.     Tenderness: There is no abdominal tenderness.  Genitourinary:    Comments: Deferred Musculoskeletal:     Cervical back: Normal range of motion and neck supple.     Comments: Examination the right knee reveals no skin wounds or lesions. He has a varus deformity. He has some swelling. No erythema or effusion. He has tenderness to palpation medial joint line parapatellar retinacular tissues with a positive grind sign. Good range of motion. No ligament instability. Painless range of motion of the hip.  Skin:    General: Skin is warm and dry.  Neurological:     General: No focal deficit present.     Mental Status: He is alert and oriented to person, place, and time.  Psychiatric:        Mood and Affect: Mood normal.     Vital signs in last 24 hours: @VSRANGES @  Labs:   Estimated body mass index is 36.16 kg/m as calculated from the following:   Height as  of an earlier encounter on 02/27/20: 5\' 10"  (1.778 m).   Weight as of an earlier encounter on 02/27/20: 114.3 kg.   Imaging Review Plain radiographs demonstrate severe degenerative joint disease of the right knee(s). The bone quality appears to be adequate for age and reported activity level.      Assessment/Plan:  End stage arthritis, right knee   The patient history, physical examination, clinical judgment of the provider and imaging studies are consistent with end stage degenerative joint disease of the right knee(s) and total knee arthroplasty is deemed medically necessary. The treatment options including medical management, injection therapy arthroscopy and arthroplasty were discussed at length. The risks and benefits of total knee arthroplasty were presented and reviewed. The risks due to aseptic loosening, infection, stiffness, patella tracking problems, thromboembolic complications and other imponderables were discussed. The patient acknowledged the explanation, agreed to proceed with the plan and consent was signed. Patient is being admitted for inpatient treatment for surgery, pain control, PT, OT, prophylactic antibiotics, VTE prophylaxis, progressive ambulation and ADL's and discharge planning. The patient is planning to be discharged home with his wife. He will complete outpatient physical therapy.     Patient's anticipated LOS is less than 2 midnights, meeting these requirements: - Lives within 1 hour of care - Has a competent adult at home to recover with post-op recover - NO history of  - Chronic pain requiring opiods  - Diabetes  - Coronary Artery Disease  - Heart failure  - Heart attack  - Stroke  - DVT/VTE  - Respiratory Failure/COPD  - Renal failure  - Anemia  - Advanced Liver disease

## 2020-02-29 NOTE — Progress Notes (Signed)
Anesthesia Chart Review:   Case: [id number] Date/Time: 03/14/20 1040   Procedure: COMPUTER ASSISTED TOTAL KNEE ARTHROPLASTY (Right Knee)   Anesthesia type: Spinal   Pre-op diagnosis: Degnerative joint disease right knee   Location: [facility] ROOM 07 / [facility]   Surgeons: Rod Can, MD      DISCUSSION: Pt is a 74 year old with hx aortic stenosis (mild by 03/01/19 echo), HTN, OSA, COPD, TIA  VS: BP 135/81   Pulse (!) 58   Temp 36.6 C (Oral)   Resp 14   Ht 5\' 10"  (1.778 m)   Wt 114.3 kg   SpO2 98%   BMI 36.16 kg/m   PROVIDERS: - PCP is Leamon Arnt, MD - Cardiologist is  Sherren Mocha, MD. Pt cleared for surgery 01/19/20 at last office visit by Leanor Kail, PA   LABS: Labs reviewed: Acceptable for surgery. (all labs ordered are listed, but only abnormal results are displayed)  Labs Reviewed  COMPREHENSIVE METABOLIC PANEL - Abnormal; Notable for the following components:      Result Value   Glucose, Bld 108 (*)    Calcium 8.8 (*)    All other components within normal limits  URINALYSIS, ROUTINE W REFLEX MICROSCOPIC - Abnormal; Notable for the following components:   Color, Urine STRAW (*)    All other components within normal limits  SURGICAL PCR SCREEN  CBC  PROTIME-INR  TYPE AND SCREEN     IMAGES: CT angio head and neck 02/28/19:  - Mild atherosclerotic disease in the carotid bifurcation without significant stenosis. Mild stenosis in the cavernous carotid bilaterally. - Moderate stenosis distal vertebral artery bilaterally. Right vertebral artery ends in PICA. - Negative for intracranial large vessel occlusion.   EKG 01/19/20: SR. PVCs   CV: Echocardiogram: 03/01/19 1. The left ventricle has normal systolic function with an ejection fraction of 60-65%. The cavity size was normal. There is moderately increased left ventricular wall thickness. Left ventricular diastolic parameters were normal. No evidence of left ventricular regional wall motion abnormalities.   2. The right ventricle has normal systolic function. The cavity was normal. There is no increase in right ventricular wall thickness.  3. The mitral valve is abnormal. Mild thickening of the mitral valve leaflet. There is mild mitral annular calcification present.  4. The tricuspid valve is grossly normal.  5. The aortic valve is abnormal. Moderate calcification of the aortic valve. Mild stenosis of the aortic valve.   Holter Monitor 01/29/17:  - The basic rhythm is sinus - There are periods of bradycardia but no pathologic pauses - Premature ventricular single beats and rare couplets - Premature supraventricular beats and short runs of 3-4 beats - No evidence of atrial fibrillation or flutter   Exercise tolerance test 01/29/17:  - ETT without ishemia and elevated BP    Past Medical History:  Diagnosis Date  . Aortic stenosis, mild    noted on ECHO  . Asthma   . Benign essential hypertension, on Norvasc and Amlodipine 12/21/2008   Followed by [facility]   . BPH (benign prostatic hyperplasia) 09/03/2012  . Carotid bruit present 06/15/2018   2015 Carotid Dopplers:  Essentially normal carotid arteries, with very slight hard plaque in the proximal ICA's and serpentine distal vessels. 1-39% bilateral ICA stenosis. Patent vertebral arteries with antegrade flow. Normal subclavian arteries, bilaterally.  . Colonic polyp 01/09/2020  . COPD (chronic obstructive pulmonary disease) ([place])   . Dyslipidemia 12/21/2008   Qualifier: Diagnosis of  By: Percival Spanish, MD, Farrel Gordon    .  Essential hypertension, benign 12/21/2008   Qualifier: Diagnosis of  By: Percival Spanish, MD, Farrel Gordon    . Former smoker, stopped smoking in distant past 03/15/2018  . Hematuria 09/16/2011  . History of migraine   . OA (osteoarthritis)   . Obese   . OSA on CPAP 09/25/2015   Diagnosed at the [facility] 2016   . Prostate cancer ([place]) 11/30/2019  . PVC's (premature ventricular contractions)   . Seasonal allergies    . Sinus bradycardia   . Squamous cell carcinoma of skin   . TIA (transient ischemic attack)    questionable  . Vitamin B 12 deficiency   . Vitamin D deficiency 11/26/2010    Past Surgical History:  Procedure Laterality Date  . CHOLECYSTECTOMY  2001  . COLONOSCOPY    . TONSILLECTOMY AND ADENOIDECTOMY  1952    MEDICATIONS: . albuterol (VENTOLIN HFA) 108 (90 Base) MCG/ACT inhaler  . alfuzosin (UROXATRAL) 10 MG 24 hr tablet  . aspirin 81 MG EC tablet  . carboxymethylcellulose (REFRESH PLUS) 0.5 % SOLN  . cetirizine (ZYRTEC) 10 MG tablet  . Cholecalciferol (VITAMIN D3) 2000 UNITS TABS  . cyanocobalamin (,VITAMIN B-12,) 1000 MCG/ML injection  . finasteride (PROSCAR) 5 MG tablet  . fluticasone (FLONASE) 50 MCG/ACT nasal spray  . ibuprofen (ADVIL) 400 MG tablet  . latanoprost (XALATAN) 0.005 % ophthalmic solution  . losartan (COZAAR) 25 MG tablet  . montelukast (SINGULAIR) 10 MG tablet  . NON FORMULARY  . rosuvastatin (CRESTOR) 20 MG tablet  . Syringe/Needle, Disp, (SYRINGE 3CC/25GX1") 25G X 1" 3 ML MISC   No current facility-administered medications for this encounter.    If no changes, I anticipate pt can proceed with surgery as scheduled.   Willeen Cass, PhD, FNP-BC [facility]/Anesthesiology Phone: [phone] 02/29/2020 2:57 PM '

## 2020-02-29 NOTE — Anesthesia Preprocedure Evaluation (Addendum)
Anesthesia Evaluation  Patient identified by MRN, date of birth, ID band Patient awake    Reviewed: Allergy & Precautions, NPO status , Patient's Chart, lab work & pertinent test results  Airway Mallampati: II  TM Distance: >3 FB Neck ROM: Full    Dental no notable dental hx.    Pulmonary sleep apnea and Continuous Positive Airway Pressure Ventilation , COPD, former smoker,    Pulmonary exam normal breath sounds clear to auscultation       Cardiovascular hypertension,  Rhythm:Regular Rate:Normal + Systolic murmurs LVEF 22-97%, moderate LVH, normal wall motion, normal diastolic  function, normal biatrial size, mild MAC, calcified aortic valve  with mild aortic stenosis (AVA around 2.0 cm2), normal IVC  FINDINGS  Left Ventricle: The left ventricle has normal systolic function, with an  ejection fraction of 60-65%. The cavity size was normal. There is  moderately increased left ventricular wall thickness. Left ventricular  diastolic parameters were normal. No  evidence of left ventricular regional wall motion abnormalities..   Right Ventricle: The right ventricle has normal systolic function. The  cavity was normal. There is no increase in right ventricular wall  thickness.   Left Atrium: Left atrial size was normal in size.   Right Atrium: Right atrial size was normal in size.   Interatrial Septum: No atrial level shunt detected by color flow Doppler.   Pericardium: There is no evidence of pericardial effusion.   Mitral Valve: The mitral valve is abnormal. Mild thickening of the mitral  valve leaflet. There is mild mitral annular calcification present. Mitral  valve regurgitation is trivial by color flow Doppler.   Tricuspid Valve: The tricuspid valve is grossly normal. Tricuspid valve  regurgitation was not visualized by color flow Doppler.   Aortic Valve: The aortic valve is abnormal Moderate calcification of the   aortic valve. Aortic valve regurgitation was not visualized by color flow  Doppler. There is Mild stenosis of the aortic valve, with a calculated  valve area of 2.18 cm.    Neuro/Psych TIAnegative psych ROS   GI/Hepatic negative GI ROS, Neg liver ROS,   Endo/Other  negative endocrine ROS  Renal/GU negative Renal ROS  negative genitourinary   Musculoskeletal negative musculoskeletal ROS (+)   Abdominal   Peds negative pediatric ROS (+)  Hematology negative hematology ROS (+)   Anesthesia Other Findings   Reproductive/Obstetrics negative OB ROS                           Anesthesia Physical Anesthesia Plan  ASA: III  Anesthesia Plan: Spinal   Post-op Pain Management:  Regional for Post-op pain   Induction: Intravenous  PONV Risk Score and Plan: 2 and Ondansetron, Dexamethasone and Treatment may vary due to age or medical condition  Airway Management Planned: Simple Face Mask  Additional Equipment:   Intra-op Plan:   Post-operative Plan:   Informed Consent: I have reviewed the patients History and Physical, chart, labs and discussed the procedure including the risks, benefits and alternatives for the proposed anesthesia with the patient or authorized representative who has indicated his/her understanding and acceptance.     Dental advisory given  Plan Discussed with: CRNA and Surgeon  Anesthesia Plan Comments: (See APP note by Durel Salts, FNP)       Anesthesia Quick Evaluation

## 2020-03-01 ENCOUNTER — Other Ambulatory Visit: Payer: Self-pay | Admitting: Family Medicine

## 2020-03-01 DIAGNOSIS — I10 Essential (primary) hypertension: Secondary | ICD-10-CM

## 2020-03-05 ENCOUNTER — Ambulatory Visit (INDEPENDENT_AMBULATORY_CARE_PROVIDER_SITE_OTHER): Payer: Medicare Other

## 2020-03-05 DIAGNOSIS — Z0000 Encounter for general adult medical examination without abnormal findings: Secondary | ICD-10-CM

## 2020-03-05 NOTE — Patient Instructions (Addendum)
Eric Stokes , Thank you for taking time to come for your Medicare Wellness Visit. I appreciate your ongoing commitment to your health goals. Please review the following plan we discussed and let me know if I can assist you in the future.   Screening recommendations/referrals: Colonoscopy: Due, pt stated him and wife will schedule testing Recommended yearly ophthalmology/optometry visit for glaucoma screening and checkup Recommended yearly dental visit for hygiene and checkup  Vaccinations: Influenza vaccine: Up to date Pneumococcal vaccine: Up to date Tdap vaccine: Up to date Shingles vaccine: Shingrix discussed. Please contact your pharmacy for coverage information.  Covid-19: Completed 2/7/ & 10/20/19  Advanced directives: Advance directive discussed with you today. I have provided a copy for you to complete at home and have notarized. Once this is complete please bring a copy in to our office so we can scan it into your chart.  Conditions/risks identified: To lose weight this year  Next appointment: Follow up in one year for your annual wellness visit.   Preventive Care 19 Years and Older, Male Preventive care refers to lifestyle choices and visits with your health care provider that can promote health and wellness. What does preventive care include?  A yearly physical exam. This is also called an annual well check.  Dental exams once or twice a year.  Routine eye exams. Ask your health care provider how often you should have your eyes checked.  Personal lifestyle choices, including:  Daily care of your teeth and gums.  Regular physical activity.  Eating a healthy diet.  Avoiding tobacco and drug use.  Limiting alcohol use.  Practicing safe sex.  Taking low doses of aspirin every day.  Taking vitamin and mineral supplements as recommended by your health care provider. What happens during an annual well check? The services and screenings done by your health care  provider during your annual well check will depend on your age, overall health, lifestyle risk factors, and family history of disease. Counseling  Your health care provider may ask you questions about your:  Alcohol use.  Tobacco use.  Drug use.  Emotional well-being.  Home and relationship well-being.  Sexual activity.  Eating habits.  History of falls.  Memory and ability to understand (cognition).  Work and work Statistician. Screening  You may have the following tests or measurements:  Height, weight, and BMI.  Blood pressure.  Lipid and cholesterol levels. These may be checked every 5 years, or more frequently if you are over 68 years old.  Skin check.  Lung cancer screening. You may have this screening every year starting at age 56 if you have a 30-pack-year history of smoking and currently smoke or have quit within the past 15 years.  Fecal occult blood test (FOBT) of the stool. You may have this test every year starting at age 51.  Flexible sigmoidoscopy or colonoscopy. You may have a sigmoidoscopy every 5 years or a colonoscopy every 10 years starting at age 6.  Prostate cancer screening. Recommendations will vary depending on your family history and other risks.  Hepatitis C blood test.  Hepatitis B blood test.  Sexually transmitted disease (STD) testing.  Diabetes screening. This is done by checking your blood sugar (glucose) after you have not eaten for a while (fasting). You may have this done every 1-3 years.  Abdominal aortic aneurysm (AAA) screening. You may need this if you are a current or former smoker.  Osteoporosis. You may be screened starting at age 8 if you are  at high risk. Talk with your health care provider about your test results, treatment options, and if necessary, the need for more tests. Vaccines  Your health care provider may recommend certain vaccines, such as:  Influenza vaccine. This is recommended every year.  Tetanus,  diphtheria, and acellular pertussis (Tdap, Td) vaccine. You may need a Td booster every 10 years.  Zoster vaccine. You may need this after age 49.  Pneumococcal 13-valent conjugate (PCV13) vaccine. One dose is recommended after age 62.  Pneumococcal polysaccharide (PPSV23) vaccine. One dose is recommended after age 54. Talk to your health care provider about which screenings and vaccines you need and how often you need them. This information is not intended to replace advice given to you by your health care provider. Make sure you discuss any questions you have with your health care provider. Document Released: 08/31/2015 Document Revised: 04/23/2016 Document Reviewed: 06/05/2015 Elsevier Interactive Patient Education  2017 [employer] Prevention in the Home Falls can cause injuries. They can happen to people of all ages. There are many things you can do to make your home safe and to help prevent falls. What can I do on the outside of my home?  Regularly fix the edges of walkways and driveways and fix any cracks.  Remove anything that might make you trip as you walk through a door, such as a raised step or threshold.  Trim any bushes or trees on the path to your home.  Use bright outdoor lighting.  Clear any walking paths of anything that might make someone trip, such as rocks or tools.  Regularly check to see if handrails are loose or broken. Make sure that both sides of any steps have handrails.  Any raised decks and porches should have guardrails on the edges.  Have any leaves, snow, or ice cleared regularly.  Use sand or salt on walking paths during winter.  Clean up any spills in your garage right away. This includes oil or grease spills. What can I do in the bathroom?  Use night lights.  Install grab bars by the toilet and in the tub and shower. Do not use towel bars as grab bars.  Use non-skid mats or decals in the tub or shower.  If you need to sit down in  the shower, use a plastic, non-slip stool.  Keep the floor dry. Clean up any water that spills on the floor as soon as it happens.  Remove soap buildup in the tub or shower regularly.  Attach bath mats securely with double-sided non-slip rug tape.  Do not have throw rugs and other things on the floor that can make you trip. What can I do in the bedroom?  Use night lights.  Make sure that you have a light by your bed that is easy to reach.  Do not use any sheets or blankets that are too big for your bed. They should not hang down onto the floor.  Have a firm chair that has side arms. You can use this for support while you get dressed.  Do not have throw rugs and other things on the floor that can make you trip. What can I do in the kitchen?  Clean up any spills right away.  Avoid walking on wet floors.  Keep items that you use a lot in easy-to-reach places.  If you need to reach something above you, use a strong step stool that has a grab bar.  Keep electrical cords out of  the way.  Do not use floor polish or wax that makes floors slippery. If you must use wax, use non-skid floor wax.  Do not have throw rugs and other things on the floor that can make you trip. What can I do with my stairs?  Do not leave any items on the stairs.  Make sure that there are handrails on both sides of the stairs and use them. Fix handrails that are broken or loose. Make sure that handrails are as long as the stairways.  Check any carpeting to make sure that it is firmly attached to the stairs. Fix any carpet that is loose or worn.  Avoid having throw rugs at the top or bottom of the stairs. If you do have throw rugs, attach them to the floor with carpet tape.  Make sure that you have a light switch at the top of the stairs and the bottom of the stairs. If you do not have them, ask someone to add them for you. What else can I do to help prevent falls?  Wear shoes that:  Do not have high  heels.  Have rubber bottoms.  Are comfortable and fit you well.  Are closed at the toe. Do not wear sandals.  If you use a stepladder:  Make sure that it is fully opened. Do not climb a closed stepladder.  Make sure that both sides of the stepladder are locked into place.  Ask someone to hold it for you, if possible.  Clearly mark and make sure that you can see:  Any grab bars or handrails.  First and last steps.  Where the edge of each step is.  Use tools that help you move around (mobility aids) if they are needed. These include:  Canes.  Walkers.  Scooters.  Crutches.  Turn on the lights when you go into a dark area. Replace any light bulbs as soon as they burn out.  Set up your furniture so you have a clear path. Avoid moving your furniture around.  If any of your floors are uneven, fix them.  If there are any pets around you, be aware of where they are.  Review your medicines with your doctor. Some medicines can make you feel dizzy. This can increase your chance of falling. Ask your doctor what other things that you can do to help prevent falls. This information is not intended to replace advice given to you by your health care provider. Make sure you discuss any questions you have with your health care provider. Document Released: 05/31/2009 Document Revised: 01/10/2016 Document Reviewed: 09/08/2014 Elsevier Interactive Patient Education  2017 [employer].

## 2020-03-05 NOTE — Progress Notes (Signed)
Subjective:   Eric Stokes is a 74 y.o. male who presents for Medicare Annual/Subsequent preventive examination.  Virtual Visit via Telephone Note  I connected with  Eric Stokes on 03/05/20 at 11:00 AM EDT by telephone and verified that I am speaking with the correct person using two identifiers.  Medicare Annual Wellness visit completed telephonically due to Covid-19 pandemic.   Persons participating in this call: This Health Coach and this patient.   Location: Patient: Home Provider: Office   I discussed the limitations, risks, security and privacy concerns of performing an evaluation and management service by telephone and the availability of in person appointments. The patient expressed understanding and agreed to proceed.  Unable to perform video visit due to video visit attempted and failed and/or patient does not have video capability.   Some vital signs may be absent or patient reported.   Eric Brace, LPN      Review of Systems     Cardiac Risk Factors include: hypertension;obesity (BMI >30kg/m2);dyslipidemia;male gender;Other (see comment) (up coming right knee surgery)     Objective:    Today's Vitals   03/05/20 1054  PainSc: 4    There is no height or weight on file to calculate BMI.  Advanced Directives 03/05/2020 02/27/2020 02/28/2019  Does Patient Have a Medical Advance Directive? No Yes No  Type of Advance Directive - [place];Living will -  Does patient want to make changes to medical advance directive? Yes (MAU/Ambulatory/Procedural Areas - Information given) - -  Would patient like information on creating a medical advance directive? - - No - Patient declined    Current Medications (verified) Outpatient Encounter Medications as of 03/05/2020  Medication Sig  . albuterol (VENTOLIN HFA) 108 (90 Base) MCG/ACT inhaler Inhale 2 puffs into the lungs every 6 (six) hours as needed for wheezing or shortness of breath.    . alfuzosin (UROXATRAL) 10 MG 24 hr tablet Take 5 mg by mouth in the morning and at bedtime.  Marland Kitchen aspirin 81 MG EC tablet Take 81 mg by mouth daily.   . carboxymethylcellulose (REFRESH PLUS) 0.5 % SOLN Place 1 drop into both eyes 2 (two) times daily as needed (dry eyes).  . cetirizine (ZYRTEC) 10 MG tablet Take 10 mg by mouth daily.  . Cholecalciferol (VITAMIN D3) 2000 UNITS TABS Take 2,000 Int'l Units by mouth daily.   . cyanocobalamin (,VITAMIN B-12,) 1000 MCG/ML injection 1000 MCG (1 MG) INJECTION ONCE PER WEEK FOR FOUR WEEKS, FOLLOWED BY 1000 MCG INJECTION ONCE PER MONTH. (Patient taking differently: Inject 1,000 mcg into the muscle every 30 (thirty) days. )  . finasteride (PROSCAR) 5 MG tablet Take 5 mg by mouth daily.  . fluticasone (FLONASE) 50 MCG/ACT nasal spray Place 1 spray into both nostrils daily as needed for allergies or rhinitis.   Marland Kitchen ibuprofen (ADVIL) 400 MG tablet Take 400 mg by mouth every 6 (six) hours as needed for moderate pain.   Marland Kitchen latanoprost (XALATAN) 0.005 % ophthalmic solution INSTILL 1 DROP INTO BOTH EYES AT BEDTIME (Patient taking differently: Place 1 drop into both eyes in the morning. )  . losartan (COZAAR) 25 MG tablet TAKE 1 TABLET BY MOUTH EVERY DAY  . montelukast (SINGULAIR) 10 MG tablet TAKE 1 TABLET BY MOUTH EVERY DAY AT BEDTIME  . NON FORMULARY Cpap at night  . Syringe/Needle, Disp, (SYRINGE 3CC/25GX1") 25G X 1" 3 ML MISC 1 application by Does not apply route once a week.  . rosuvastatin (CRESTOR)  20 MG tablet Take 1 tablet (20 mg total) by mouth daily. (Patient not taking: Reported on 03/05/2020)   No facility-administered encounter medications on file as of 03/05/2020.    Allergies (verified) Tamsulosin, Cipro [ciprofloxacin hcl], Lipitor [atorvastatin], and Lisinopril   History: Past Medical History:  Diagnosis Date  . Aortic stenosis, mild    noted on ECHO  . Asthma   . Benign essential hypertension, on Norvasc and Amlodipine 12/21/2008   Followed by  [facility]   . BPH (benign prostatic hyperplasia) 09/03/2012  . Carotid bruit present 06/15/2018   2015 Carotid Dopplers:  Essentially normal carotid arteries, with very slight hard plaque in the proximal ICA's and serpentine distal vessels. 1-39% bilateral ICA stenosis. Patent vertebral arteries with antegrade flow. Normal subclavian arteries, bilaterally.  . Colonic polyp 01/09/2020  . COPD (chronic obstructive pulmonary disease) ([place])   . Dyslipidemia 12/21/2008   Qualifier: Diagnosis of  By: Percival Spanish, MD, Farrel Gordon    . Essential hypertension, benign 12/21/2008   Qualifier: Diagnosis of  By: Percival Spanish, MD, Farrel Gordon    . Former smoker, stopped smoking in distant past 03/15/2018  . Hematuria 09/16/2011  . History of migraine   . OA (osteoarthritis)   . Obese   . OSA on CPAP 09/25/2015   Diagnosed at the [facility] 2016   . Prostate cancer ([place]) 11/30/2019  . PVC's (premature ventricular contractions)   . Seasonal allergies   . Sinus bradycardia   . Squamous cell carcinoma of skin   . TIA (transient ischemic attack)    questionable  . Vitamin B 12 deficiency   . Vitamin D deficiency 11/26/2010   Past Surgical History:  Procedure Laterality Date  . CHOLECYSTECTOMY  2001  . COLONOSCOPY    . TONSILLECTOMY AND ADENOIDECTOMY  1952   Family History  Problem Relation Age of Onset  . Diabetes Mother   . COPD Father   . Stroke Paternal Grandfather    Social History   Socioeconomic History  . Marital status: Married    Spouse name: Not on file  . Number of children: Not on file  . Years of education: Not on file  . Highest education level: Not on file  Occupational History  . Occupation: retired  Tobacco Use  . Smoking status: Former Smoker    Types: Cigarettes    Quit date: 08/18/1965    Years since quitting: 54.5  . Smokeless tobacco: Never Used  . Tobacco comment: pt quit smoking 53 yrs ago  Vaping Use  . Vaping Use: Never used  Substance and Sexual Activity   . Alcohol use: Yes    Alcohol/week: 2.0 - 6.0 standard drinks    Types: 2 - 6 Cans of beer per week  . Drug use: No  . Sexual activity: Yes  Other Topics Concern  . Not on file  Social History Narrative  . Not on file   Social Determinants of Health   Financial Resource Strain: Low Risk   . Difficulty of Paying Living Expenses: Not hard at all  Food Insecurity: No Food Insecurity  . Worried About Charity fundraiser in the Last Year: Never true  . Ran Out of Food in the Last Year: Never true  Transportation Needs: No Transportation Needs  . Lack of Transportation (Medical): No  . Lack of Transportation (Non-Medical): No  Physical Activity: Unknown  . Days of Exercise per Week: 7 days  . Minutes of Exercise per Session: Not on file  Stress: No Stress Concern Present  . Feeling of Stress : Not at all  Social Connections: Moderately Isolated  . Frequency of Communication with Friends and Family: More than three times a week  . Frequency of Social Gatherings with Friends and Family: More than three times a week  . Attends Religious Services: Never  . Active Member of Clubs or Organizations: No  . Attends Archivist Meetings: Never  . Marital Status: Married    Tobacco Counseling Counseling given: Not Answered Comment: pt quit smoking 53 yrs ago   Clinical Intake:  Pre-visit preparation completed: Yes  Pain : 0-10 Pain Score: 4  Pain Type: Chronic pain Pain Location: Knee Pain Orientation: Right Pain Descriptors / Indicators: Aching Pain Onset: More than a month ago Pain Frequency: Constant     BMI - recorded: 36.16 Nutritional Status: BMI > 30  Obese Nutritional Risks: None Diabetes: No  How often do you need to have someone help you when you read instructions, pamphlets, or other written materials from your doctor or pharmacy?: 1 - Never  Diabetic?No Interpreter Needed?: No  Information entered by :: Charlott Rakes, LPN   Activities of Daily  Living In your present state of health, do you have any difficulty performing the following activities: 03/05/2020 02/27/2020  Hearing? Tempie Donning  Comment wears hearing aids -  Vision? N N  Difficulty concentrating or making decisions? N N  Walking or climbing stairs? Y Y  Dressing or bathing? N N  Doing errands, shopping? N N  Preparing Food and eating ? N -  Using the Toilet? N -  In the past six months, have you accidently leaked urine? N -  Do you have problems with loss of bowel control? N -  Managing your Medications? N -  Managing your Finances? N -  Housekeeping or managing your Housekeeping? N -  Some recent data might be hidden    Patient Care Team: Leamon Arnt, MD as PCP - General (Family Medicine) Rod Can, MD as Consulting Physician (Orthopedic Surgery) Georgena Spurling as Physician Assistant (Cardiology) Juanita Craver, MD as Consulting Physician (Gastroenterology) Levy Sjogren, MD as Referring Physician (Dermatology) Festus Aloe, MD as Consulting Physician (Urology) Irine Seal, MD as Attending Physician (Urology)  Indicate any recent Medical Services you may have received from other than [facility] providers in the past year (date may be approximate).     Assessment:   This is a routine wellness examination for [facility].  Hearing/Vision screen  Hearing Screening   125Hz  250Hz  500Hz  1000Hz  2000Hz  3000Hz  4000Hz  6000Hz  8000Hz   Right ear:           Left ear:           Comments: Pt wears hearing aids   Vision Screening Comments: Wears eye glasses and [facility]  Dietary issues and exercise activities discussed: Current Exercise Habits: The patient has a physically strenuous job, but has no regular exercise apart from work. (Pt states he is building a deck and gardening everyday)  Goals    . Patient Stated     Lose weight       Depression Screen PHQ 2/9 Scores 03/05/2020 04/12/2019 07/28/2018 03/15/2018  12/16/2016 09/25/2015  PHQ - 2 Score 0 0 1 0 0 1  PHQ- 9 Score - - 8 7 - -    Fall Risk Fall Risk  03/05/2020 08/11/2019 03/15/2018 12/16/2016 09/25/2015  Falls in the past year? 0  0 Yes No No  Number falls in past yr: 0 0 - - -  Injury with Fall? 0 0 No - -  Risk for fall due to : Impaired balance/gait;Impaired mobility;Impaired vision - - - -  Follow up Falls prevention discussed Falls evaluation completed - - -    Any stairs in or around the home? Yes  If so, are there any without handrails? No Home free of loose throw rugs in walkways, pet beds, electrical cords, etc? Yes  Adequate lighting in your home to reduce risk of falls? Yes   ASSISTIVE DEVICES UTILIZED TO PREVENT FALLS:  Life alert? No  Use of a cane, walker or w/c? No  Grab bars in the bathroom? No  Shower chair or bench in shower? No  Elevated toilet seat or a handicapped toilet? Yes   TIMED UP AND GO:  Was the test performed? No .    Cognitive Function:     6CIT Screen 03/05/2020  What Year? 0 points  What month? 0 points  What time? 0 points  Count back from 20 0 points  Months in reverse 0 points  Repeat phrase 2 points  Total Score 2    Immunizations Immunization History  Administered Date(s) Administered  . Influenza, High Dose Seasonal PF 05/19/2018  . Influenza,inj,Quad PF,6+ Mos 09/06/2013  . Influenza-Unspecified 08/03/2014, 06/01/2015  . PFIZER SARS-COV-2 Vaccination 09/25/2019, 10/20/2019  . Pneumococcal Conjugate-13 09/06/2013  . Pneumococcal Polysaccharide-23 06/01/2015  . Pneumococcal-Unspecified 11/20/2010  . Td 08/18/2004, 11/16/2012  . Tdap 08/21/2010  . Zoster 11/20/2010, 06/19/2011    TDAP status: Up to date Flu Vaccine status: Up to date Pneumococcal vaccine status: Up to date Covid-19 vaccine status: Completed vaccines  Qualifies for Shingles Vaccine? Yes   Zostavax completed Yes   Shingrix Completed?: No.    Education has been provided regarding the importance of this vaccine.  Patient has been advised to call insurance company to determine out of pocket expense if they have not yet received this vaccine. Advised may also receive vaccine at local pharmacy or Health Dept. Verbalized acceptance and understanding.  Screening Tests Health Maintenance  Topic Date Due  . COLONOSCOPY  Never done  . INFLUENZA VACCINE  03/18/2020  . TETANUS/TDAP  11/17/2022  . COVID-19 Vaccine  Completed  . Hepatitis C Screening  Completed  . PNA vac Low Risk Adult  Completed    Health Maintenance  Health Maintenance Due  Topic Date Due  . COLONOSCOPY  Never done    Colorectal Cancer screening:Due  Additional Screening:  Hepatitis C Screening:  Completed 06/01/15  Vision Screening: Recommended annual ophthalmology exams for early detection of glaucoma and other disorders of the eye. Is the patient up to date with their annual eye exam?  Yes  Who is the provider or what is the name of the office in which the patient attends annual eye exams? [facility]  Milas Kocher Screening: Recommended annual dental exams for proper oral hygiene  Community Resource Referral / Chronic Care Management: CRR required this visit?  No  CCM required this visit?  No      Plan:     I have personally reviewed and noted the following in the patient's chart:   . Medical and social history . Use of alcohol, tobacco or illicit drugs  . Current medications and supplements . Functional ability and status . Nutritional status . Physical activity . Advanced directives . List of other physicians . Hospitalizations, surgeries, and ER visits in previous  12 months . Vitals . Screenings to include cognitive, depression, and falls . Referrals and appointments  In addition, I have reviewed and discussed with patient certain preventive protocols, quality metrics, and best practice recommendations. A written personalized care plan for preventive services as well as general preventive health recommendations  were provided to patient.     Eric Brace, LPN   11/18/9793   Nurse Notes: Pt is requesting to have PSA labs drawn with lab visit on 04/11/20.

## 2020-03-10 ENCOUNTER — Other Ambulatory Visit (HOSPITAL_COMMUNITY)
Admission: RE | Admit: 2020-03-10 | Discharge: 2020-03-10 | Disposition: A | Discharge status: Home / Self Care | Payer: No Typology Code available for payment source | Source: Ambulatory Visit | Attending: Orthopedic Surgery | Admitting: Orthopedic Surgery

## 2020-03-10 DIAGNOSIS — Z20822 Contact with and (suspected) exposure to covid-19: Secondary | ICD-10-CM | POA: Insufficient documentation

## 2020-03-10 DIAGNOSIS — Z01812 Encounter for preprocedural laboratory examination: Secondary | ICD-10-CM | POA: Insufficient documentation

## 2020-03-10 LAB — SARS CORONAVIRUS 2 (TAT 6-24 HRS): SARS Coronavirus 2: NEGATIVE

## 2020-03-14 ENCOUNTER — Inpatient Hospital Stay (HOSPITAL_COMMUNITY): Payer: No Typology Code available for payment source | Admitting: Anesthesiology

## 2020-03-14 ENCOUNTER — Ambulatory Visit (HOSPITAL_COMMUNITY)
Admission: RE | Admit: 2020-03-14 | Discharge: 2020-03-15 | Disposition: A | Discharge status: Home / Self Care | Payer: No Typology Code available for payment source | Source: Other Acute Inpatient Hospital | Attending: Orthopedic Surgery | Admitting: Orthopedic Surgery

## 2020-03-14 ENCOUNTER — Other Ambulatory Visit: Payer: Self-pay

## 2020-03-14 ENCOUNTER — Encounter (HOSPITAL_COMMUNITY): Payer: Self-pay | Admitting: Orthopedic Surgery

## 2020-03-14 ENCOUNTER — Inpatient Hospital Stay (HOSPITAL_COMMUNITY): Payer: No Typology Code available for payment source | Admitting: Physician Assistant

## 2020-03-14 ENCOUNTER — Ambulatory Visit (HOSPITAL_COMMUNITY): Payer: No Typology Code available for payment source

## 2020-03-14 ENCOUNTER — Encounter (HOSPITAL_COMMUNITY)
Admission: RE | Disposition: A | Discharge status: Home / Self Care | Payer: Self-pay | Source: Other Acute Inpatient Hospital | Attending: Orthopedic Surgery

## 2020-03-14 DIAGNOSIS — Z8546 Personal history of malignant neoplasm of prostate: Secondary | ICD-10-CM | POA: Diagnosis not present

## 2020-03-14 DIAGNOSIS — Z85828 Personal history of other malignant neoplasm of skin: Secondary | ICD-10-CM | POA: Diagnosis not present

## 2020-03-14 DIAGNOSIS — Z8673 Personal history of transient ischemic attack (TIA), and cerebral infarction without residual deficits: Secondary | ICD-10-CM | POA: Diagnosis not present

## 2020-03-14 DIAGNOSIS — Z7982 Long term (current) use of aspirin: Secondary | ICD-10-CM | POA: Insufficient documentation

## 2020-03-14 DIAGNOSIS — M1711 Unilateral primary osteoarthritis, right knee: Principal | ICD-10-CM | POA: Diagnosis present

## 2020-03-14 DIAGNOSIS — Z87891 Personal history of nicotine dependence: Secondary | ICD-10-CM | POA: Insufficient documentation

## 2020-03-14 DIAGNOSIS — H9190 Unspecified hearing loss, unspecified ear: Secondary | ICD-10-CM | POA: Insufficient documentation

## 2020-03-14 DIAGNOSIS — G4733 Obstructive sleep apnea (adult) (pediatric): Secondary | ICD-10-CM | POA: Insufficient documentation

## 2020-03-14 DIAGNOSIS — E785 Hyperlipidemia, unspecified: Secondary | ICD-10-CM | POA: Insufficient documentation

## 2020-03-14 DIAGNOSIS — E559 Vitamin D deficiency, unspecified: Secondary | ICD-10-CM | POA: Diagnosis not present

## 2020-03-14 DIAGNOSIS — E669 Obesity, unspecified: Secondary | ICD-10-CM | POA: Insufficient documentation

## 2020-03-14 DIAGNOSIS — I10 Essential (primary) hypertension: Secondary | ICD-10-CM | POA: Insufficient documentation

## 2020-03-14 DIAGNOSIS — Z79899 Other long term (current) drug therapy: Secondary | ICD-10-CM | POA: Insufficient documentation

## 2020-03-14 DIAGNOSIS — N401 Enlarged prostate with lower urinary tract symptoms: Secondary | ICD-10-CM | POA: Diagnosis not present

## 2020-03-14 DIAGNOSIS — Z6835 Body mass index (BMI) 35.0-35.9, adult: Secondary | ICD-10-CM | POA: Insufficient documentation

## 2020-03-14 DIAGNOSIS — E538 Deficiency of other specified B group vitamins: Secondary | ICD-10-CM | POA: Diagnosis not present

## 2020-03-14 DIAGNOSIS — J449 Chronic obstructive pulmonary disease, unspecified: Secondary | ICD-10-CM | POA: Insufficient documentation

## 2020-03-14 DIAGNOSIS — G4730 Sleep apnea, unspecified: Secondary | ICD-10-CM | POA: Insufficient documentation

## 2020-03-14 HISTORY — PX: KNEE ARTHROPLASTY: SHX992

## 2020-03-14 LAB — TYPE AND SCREEN
ABO/RH(D): O POS
Antibody Screen: NEGATIVE

## 2020-03-14 LAB — ABO/RH: ABO/RH(D): O POS

## 2020-03-14 SURGERY — ARTHROPLASTY, KNEE, TOTAL, USING IMAGELESS COMPUTER-ASSISTED NAVIGATION
Anesthesia: Spinal | Site: Knee | Laterality: Right

## 2020-03-14 MED ORDER — ALBUTEROL SULFATE (2.5 MG/3ML) 0.083% IN NEBU: 2.5000 mg | INHALATION_SOLUTION | Freq: Four times a day (QID) | RESPIRATORY_TRACT | Status: DC | PRN

## 2020-03-14 MED ORDER — PROPOFOL 1000 MG/100ML IV EMUL
INTRAVENOUS | Status: AC
  Filled 2020-03-14: qty 100

## 2020-03-14 MED ORDER — SODIUM CHLORIDE 0.9% IV SOLUTION
INTRAVENOUS | Status: AC | PRN
  Administered 2020-03-14: 1000 mL via INTRAMUSCULAR

## 2020-03-14 MED ORDER — BUPIVACAINE HCL (PF) 0.5 % IJ SOLN
INTRAMUSCULAR | Status: DC | PRN
  Administered 2020-03-14: 20 mL via PERINEURAL

## 2020-03-14 MED ORDER — PHENYLEPHRINE HCL (PRESSORS) 10 MG/ML IV SOLN
INTRAVENOUS | Status: AC
  Filled 2020-03-14: qty 1

## 2020-03-14 MED ORDER — TRANEXAMIC ACID-NACL 1000-0.7 MG/100ML-% IV SOLN
1000.0000 mg | INTRAVENOUS | Status: AC
  Administered 2020-03-14: 1000 mg via INTRAVENOUS
  Filled 2020-03-14: qty 100

## 2020-03-14 MED ORDER — ALBUMIN HUMAN 5 % IV SOLN
INTRAVENOUS | Status: AC
  Filled 2020-03-14: qty 250

## 2020-03-14 MED ORDER — ONDANSETRON HCL 4 MG/2ML IJ SOLN
INTRAMUSCULAR | Status: AC
  Filled 2020-03-14: qty 2

## 2020-03-14 MED ORDER — ACETAMINOPHEN 325 MG PO TABS: 325.0000 mg | ORAL_TABLET | Freq: Four times a day (QID) | ORAL | Status: DC | PRN

## 2020-03-14 MED ORDER — KETOROLAC TROMETHAMINE 30 MG/ML IJ SOLN
INTRAMUSCULAR | Status: DC | PRN
  Administered 2020-03-14: 30 mg via INTRAVENOUS

## 2020-03-14 MED ORDER — ASPIRIN 81 MG PO CHEW
81.0000 mg | CHEWABLE_TABLET | Freq: Two times a day (BID) | ORAL | Status: DC
  Administered 2020-03-14 – 2020-03-15 (×2): 81 mg via ORAL
  Filled 2020-03-14 (×2): qty 1

## 2020-03-14 MED ORDER — CHLORHEXIDINE GLUCONATE 0.12 % MT SOLN
15.0000 mL | Freq: Once | OROMUCOSAL | Status: AC
  Administered 2020-03-14: 15 mL via OROMUCOSAL

## 2020-03-14 MED ORDER — DOCUSATE SODIUM 100 MG PO CAPS
100.0000 mg | ORAL_CAPSULE | Freq: Two times a day (BID) | ORAL | Status: DC
  Administered 2020-03-14 – 2020-03-15 (×2): 100 mg via ORAL
  Filled 2020-03-14 (×2): qty 1

## 2020-03-14 MED ORDER — SODIUM CHLORIDE 0.9 % IV SOLN: INTRAVENOUS | Status: DC

## 2020-03-14 MED ORDER — MIDAZOLAM HCL 2 MG/2ML IJ SOLN
INTRAMUSCULAR | Status: DC | PRN
  Administered 2020-03-14: 1 mg via INTRAVENOUS

## 2020-03-14 MED ORDER — SODIUM CHLORIDE (PF) 0.9 % IJ SOLN
INTRAMUSCULAR | Status: AC
  Filled 2020-03-14: qty 50

## 2020-03-14 MED ORDER — MORPHINE SULFATE (PF) 2 MG/ML IV SOLN
0.5000 mg | INTRAVENOUS | Status: DC | PRN
  Administered 2020-03-14: 1 mg via INTRAVENOUS
  Filled 2020-03-14: qty 1

## 2020-03-14 MED ORDER — METOCLOPRAMIDE HCL 5 MG/ML IJ SOLN: 5.0000 mg | Freq: Three times a day (TID) | INTRAMUSCULAR | Status: DC | PRN

## 2020-03-14 MED ORDER — PROPOFOL 10 MG/ML IV BOLUS
INTRAVENOUS | Status: AC
  Filled 2020-03-14: qty 20

## 2020-03-14 MED ORDER — CEFAZOLIN SODIUM-DEXTROSE 2-4 GM/100ML-% IV SOLN
2.0000 g | INTRAVENOUS | Status: AC
  Administered 2020-03-14: 2 g via INTRAVENOUS
  Filled 2020-03-14: qty 100

## 2020-03-14 MED ORDER — FENTANYL CITRATE (PF) 100 MCG/2ML IJ SOLN
50.0000 ug | Freq: Once | INTRAMUSCULAR | Status: AC
  Administered 2020-03-14: 50 ug via INTRAVENOUS
  Filled 2020-03-14: qty 2

## 2020-03-14 MED ORDER — MONTELUKAST SODIUM 10 MG PO TABS
10.0000 mg | ORAL_TABLET | Freq: Every day | ORAL | Status: DC
  Administered 2020-03-14: 10 mg via ORAL
  Filled 2020-03-14: qty 1

## 2020-03-14 MED ORDER — LORATADINE 10 MG PO TABS
10.0000 mg | ORAL_TABLET | Freq: Every day | ORAL | Status: DC
  Administered 2020-03-14: 10 mg via ORAL
  Filled 2020-03-14: qty 1

## 2020-03-14 MED ORDER — PROPOFOL 500 MG/50ML IV EMUL
INTRAVENOUS | Status: DC | PRN
  Administered 2020-03-14: 50 ug/kg/min via INTRAVENOUS

## 2020-03-14 MED ORDER — ACETAMINOPHEN 10 MG/ML IV SOLN
1000.0000 mg | INTRAVENOUS | Status: AC
  Administered 2020-03-14: 1000 mg via INTRAVENOUS
  Filled 2020-03-14: qty 100

## 2020-03-14 MED ORDER — ALUM & MAG HYDROXIDE-SIMETH 200-200-20 MG/5ML PO SUSP: 30.0000 mL | ORAL | Status: DC | PRN

## 2020-03-14 MED ORDER — KETOROLAC TROMETHAMINE 15 MG/ML IJ SOLN
7.5000 mg | Freq: Four times a day (QID) | INTRAMUSCULAR | Status: DC
  Administered 2020-03-14 – 2020-03-15 (×3): 7.5 mg via INTRAVENOUS
  Filled 2020-03-14 (×4): qty 1

## 2020-03-14 MED ORDER — LATANOPROST 0.005 % OP SOLN
1.0000 [drp] | Freq: Every morning | OPHTHALMIC | Status: DC
  Administered 2020-03-15: 1 [drp] via OPHTHALMIC
  Filled 2020-03-14: qty 2.5

## 2020-03-14 MED ORDER — MIDAZOLAM HCL 2 MG/2ML IJ SOLN
INTRAMUSCULAR | Status: AC
  Filled 2020-03-14: qty 2

## 2020-03-14 MED ORDER — PHENYLEPHRINE HCL-NACL 10-0.9 MG/250ML-% IV SOLN
INTRAVENOUS | Status: DC | PRN
  Administered 2020-03-14: 20 ug/min via INTRAVENOUS

## 2020-03-14 MED ORDER — METHOCARBAMOL 500 MG IVPB - SIMPLE MED
500.0000 mg | Freq: Four times a day (QID) | INTRAVENOUS | Status: DC | PRN
  Filled 2020-03-14: qty 50

## 2020-03-14 MED ORDER — PRAVASTATIN SODIUM 20 MG PO TABS
20.0000 mg | ORAL_TABLET | Freq: Every day | ORAL | Status: DC
  Administered 2020-03-14: 20 mg via ORAL
  Filled 2020-03-14: qty 1

## 2020-03-14 MED ORDER — FENTANYL CITRATE (PF) 100 MCG/2ML IJ SOLN
INTRAMUSCULAR | Status: AC
  Filled 2020-03-14: qty 2

## 2020-03-14 MED ORDER — PROPOFOL 10 MG/ML IV BOLUS
INTRAVENOUS | Status: DC | PRN
  Administered 2020-03-14: 10 mg via INTRAVENOUS

## 2020-03-14 MED ORDER — METHOCARBAMOL 500 MG PO TABS
500.0000 mg | ORAL_TABLET | Freq: Four times a day (QID) | ORAL | Status: DC | PRN
  Administered 2020-03-14 – 2020-03-15 (×2): 500 mg via ORAL
  Filled 2020-03-14 (×2): qty 1

## 2020-03-14 MED ORDER — ONDANSETRON HCL 4 MG PO TABS: 4.0000 mg | ORAL_TABLET | Freq: Four times a day (QID) | ORAL | Status: DC | PRN

## 2020-03-14 MED ORDER — METOCLOPRAMIDE HCL 5 MG PO TABS: 5.0000 mg | ORAL_TABLET | Freq: Three times a day (TID) | ORAL | Status: DC | PRN

## 2020-03-14 MED ORDER — KETOROLAC TROMETHAMINE 30 MG/ML IJ SOLN
INTRAMUSCULAR | Status: AC
  Filled 2020-03-14: qty 1

## 2020-03-14 MED ORDER — BUPIVACAINE HCL (PF) 0.25 % IJ SOLN
INTRAMUSCULAR | Status: AC
  Filled 2020-03-14: qty 30

## 2020-03-14 MED ORDER — MIDAZOLAM HCL 2 MG/2ML IJ SOLN
1.0000 mg | INTRAMUSCULAR | Status: DC
  Administered 2020-03-14: 1 mg via INTRAVENOUS
  Filled 2020-03-14: qty 2

## 2020-03-14 MED ORDER — DEXAMETHASONE SODIUM PHOSPHATE 10 MG/ML IJ SOLN
INTRAMUSCULAR | Status: AC
  Filled 2020-03-14: qty 1

## 2020-03-14 MED ORDER — ONDANSETRON HCL 4 MG/2ML IJ SOLN
INTRAMUSCULAR | Status: DC | PRN
  Administered 2020-03-14: 4 mg via INTRAVENOUS

## 2020-03-14 MED ORDER — POVIDONE-IODINE 10 % EX SWAB
2.0000 "application " | Freq: Once | CUTANEOUS | Status: AC
  Administered 2020-03-14: 2 via TOPICAL

## 2020-03-14 MED ORDER — ORAL CARE MOUTH RINSE: 15.0000 mL | Freq: Once | OROMUCOSAL | Status: AC

## 2020-03-14 MED ORDER — CEFAZOLIN SODIUM-DEXTROSE 2-4 GM/100ML-% IV SOLN
2.0000 g | Freq: Four times a day (QID) | INTRAVENOUS | Status: AC
  Administered 2020-03-14 – 2020-03-15 (×2): 2 g via INTRAVENOUS
  Filled 2020-03-14 (×2): qty 100

## 2020-03-14 MED ORDER — FENTANYL CITRATE (PF) 100 MCG/2ML IJ SOLN
INTRAMUSCULAR | Status: DC | PRN
  Administered 2020-03-14: 50 ug via INTRAVENOUS

## 2020-03-14 MED ORDER — STERILE WATER FOR IRRIGATION IR SOLN
Status: DC | PRN
  Administered 2020-03-14: 2000 mL

## 2020-03-14 MED ORDER — ALBUMIN HUMAN 5 % IV SOLN: INTRAVENOUS | Status: DC | PRN

## 2020-03-14 MED ORDER — BUPIVACAINE-EPINEPHRINE 0.25% -1:200000 IJ SOLN
INTRAMUSCULAR | Status: DC | PRN
  Administered 2020-03-14: 30 mL

## 2020-03-14 MED ORDER — HYDROCODONE-ACETAMINOPHEN 5-325 MG PO TABS
1.0000 | ORAL_TABLET | ORAL | Status: DC | PRN
  Administered 2020-03-14 – 2020-03-15 (×2): 1 via ORAL
  Administered 2020-03-15: 2 via ORAL
  Filled 2020-03-14 (×2): qty 1
  Filled 2020-03-14: qty 2

## 2020-03-14 MED ORDER — POLYETHYLENE GLYCOL 3350 17 G PO PACK: 17.0000 g | PACK | Freq: Every day | ORAL | Status: DC | PRN

## 2020-03-14 MED ORDER — LIDOCAINE 2% (20 MG/ML) 5 ML SYRINGE
INTRAMUSCULAR | Status: AC
  Filled 2020-03-14: qty 5

## 2020-03-14 MED ORDER — BUPIVACAINE IN DEXTROSE 0.75-8.25 % IT SOLN
INTRATHECAL | Status: DC | PRN
  Administered 2020-03-14: 1.8 mL via INTRATHECAL

## 2020-03-14 MED ORDER — ISOPROPYL ALCOHOL 70 % SOLN
Status: DC | PRN
  Administered 2020-03-14: 1 via TOPICAL

## 2020-03-14 MED ORDER — DEXAMETHASONE SODIUM PHOSPHATE 10 MG/ML IJ SOLN
10.0000 mg | Freq: Once | INTRAMUSCULAR | Status: AC
  Administered 2020-03-15: 10 mg via INTRAVENOUS
  Filled 2020-03-14: qty 1

## 2020-03-14 MED ORDER — SODIUM CHLORIDE (PF) 0.9 % IJ SOLN
INTRAMUSCULAR | Status: DC | PRN
  Administered 2020-03-14: 30 mL via INTRAVENOUS

## 2020-03-14 MED ORDER — 0.9 % SODIUM CHLORIDE (POUR BTL) OPTIME
TOPICAL | Status: DC | PRN
  Administered 2020-03-14: 1000 mL

## 2020-03-14 MED ORDER — DIPHENHYDRAMINE HCL 12.5 MG/5ML PO ELIX
12.5000 mg | ORAL_SOLUTION | ORAL | Status: DC | PRN
  Filled 2020-03-14: qty 10

## 2020-03-14 MED ORDER — CLONIDINE HCL (ANALGESIA) 100 MCG/ML EP SOLN
EPIDURAL | Status: DC | PRN
Start: 2020-03-14 — End: 2020-03-14
  Administered 2020-03-14: 100 ug

## 2020-03-14 MED ORDER — ALFUZOSIN HCL ER 10 MG PO TB24
10.0000 mg | ORAL_TABLET | Freq: Every day | ORAL | Status: DC
  Administered 2020-03-15: 10 mg via ORAL
  Filled 2020-03-14: qty 1

## 2020-03-14 MED ORDER — SODIUM CHLORIDE 0.9 % IR SOLN
Status: DC | PRN
  Administered 2020-03-14: 3000 mL

## 2020-03-14 MED ORDER — MENTHOL 3 MG MT LOZG: 1.0000 | LOZENGE | OROMUCOSAL | Status: DC | PRN

## 2020-03-14 MED ORDER — HYDROCODONE-ACETAMINOPHEN 7.5-325 MG PO TABS
1.0000 | ORAL_TABLET | ORAL | Status: DC | PRN
  Administered 2020-03-15: 2 via ORAL
  Filled 2020-03-14: qty 2

## 2020-03-14 MED ORDER — PHENYLEPHRINE 40 MCG/ML (10ML) SYRINGE FOR IV PUSH (FOR BLOOD PRESSURE SUPPORT)
PREFILLED_SYRINGE | INTRAVENOUS | Status: AC
  Filled 2020-03-14: qty 10

## 2020-03-14 MED ORDER — LACTATED RINGERS IV SOLN: INTRAVENOUS | Status: DC

## 2020-03-14 MED ORDER — DEXAMETHASONE SODIUM PHOSPHATE 10 MG/ML IJ SOLN
INTRAMUSCULAR | Status: DC | PRN
  Administered 2020-03-14: 4 mg via INTRAVENOUS

## 2020-03-14 MED ORDER — ONDANSETRON HCL 4 MG/2ML IJ SOLN: 4.0000 mg | Freq: Four times a day (QID) | INTRAMUSCULAR | Status: DC | PRN

## 2020-03-14 MED ORDER — HYDROMORPHONE HCL 1 MG/ML IJ SOLN: 0.2500 mg | INTRAMUSCULAR | Status: DC | PRN

## 2020-03-14 MED ORDER — FLUTICASONE PROPIONATE 50 MCG/ACT NA SUSP
1.0000 | Freq: Every day | NASAL | Status: DC | PRN
  Filled 2020-03-14: qty 16

## 2020-03-14 MED ORDER — LIDOCAINE 2% (20 MG/ML) 5 ML SYRINGE
INTRAMUSCULAR | Status: DC | PRN
  Administered 2020-03-14: 40 mg via INTRAVENOUS

## 2020-03-14 MED ORDER — PHENOL 1.4 % MT LIQD: 1.0000 | OROMUCOSAL | Status: DC | PRN

## 2020-03-14 MED ORDER — FINASTERIDE 5 MG PO TABS
5.0000 mg | ORAL_TABLET | Freq: Every day | ORAL | Status: DC
  Administered 2020-03-14: 5 mg via ORAL
  Filled 2020-03-14: qty 1

## 2020-03-14 MED ORDER — LOSARTAN POTASSIUM 25 MG PO TABS
25.0000 mg | ORAL_TABLET | Freq: Every day | ORAL | Status: DC
  Administered 2020-03-14: 25 mg via ORAL
  Filled 2020-03-14: qty 1

## 2020-03-14 MED ORDER — PHENYLEPHRINE 40 MCG/ML (10ML) SYRINGE FOR IV PUSH (FOR BLOOD PRESSURE SUPPORT)
PREFILLED_SYRINGE | INTRAVENOUS | Status: DC | PRN
  Administered 2020-03-14: 40 ug via INTRAVENOUS
  Administered 2020-03-14: 60 ug via INTRAVENOUS
  Administered 2020-03-14: 40 ug via INTRAVENOUS

## 2020-03-14 MED ORDER — ROSUVASTATIN CALCIUM 20 MG PO TABS: 20.0000 mg | ORAL_TABLET | Freq: Every day | ORAL | Status: DC

## 2020-03-14 SURGICAL SUPPLY — 72 items
ADH SKN CLS APL DERMABOND .7 (GAUZE/BANDAGES/DRESSINGS) ×1
APL PRP STRL LF DISP 70% ISPRP (MISCELLANEOUS) ×2
BAG SPEC THK2 15X12 ZIP CLS (MISCELLANEOUS)
BAG ZIPLOCK 12X15 (MISCELLANEOUS) IMPLANT
BATTERY INSTRU NAVIGATION (MISCELLANEOUS) ×6 IMPLANT
BLADE SAW RECIPROCATING 77.5 (BLADE) ×2 IMPLANT
BNDG ELASTIC 4X5.8 VLCR STR LF (GAUZE/BANDAGES/DRESSINGS) ×2 IMPLANT
BNDG ELASTIC 6X5.8 VLCR STR LF (GAUZE/BANDAGES/DRESSINGS) ×2 IMPLANT
BSPLAT TIB 7 KN TRITANIUM (Knees) ×1 IMPLANT
BTRY SRG DRVR LF (MISCELLANEOUS) ×3
CHLORAPREP W/TINT 26 (MISCELLANEOUS) ×4 IMPLANT
COMPONENT TRI CR FEM SZ8 RT KN (Orthopedic Implant) IMPLANT
COVER SURGICAL LIGHT HANDLE (MISCELLANEOUS) ×2 IMPLANT
COVER WAND RF STERILE (DRAPES) IMPLANT
CUFF TOURN SGL QUICK 34 (TOURNIQUET CUFF) ×2
CUFF TRNQT CYL 34X4.125X (TOURNIQUET CUFF) ×1 IMPLANT
DECANTER SPIKE VIAL GLASS SM (MISCELLANEOUS) ×4 IMPLANT
DERMABOND ADVANCED (GAUZE/BANDAGES/DRESSINGS) ×1
DERMABOND ADVANCED .7 DNX12 (GAUZE/BANDAGES/DRESSINGS) ×2 IMPLANT
DRAPE SHEET LG 3/4 BI-LAMINATE (DRAPES) ×6 IMPLANT
DRAPE U-SHAPE 47X51 STRL (DRAPES) ×2 IMPLANT
DRSG AQUACEL AG ADV 3.5X10 (GAUZE/BANDAGES/DRESSINGS) ×2 IMPLANT
DRSG TEGADERM 4X4.75 (GAUZE/BANDAGES/DRESSINGS) IMPLANT
ELECT BLADE TIP CTD 4 INCH (ELECTRODE) ×2 IMPLANT
ELECT REM PT RETURN 15FT ADLT (MISCELLANEOUS) ×2 IMPLANT
EVACUATOR 1/8 PVC DRAIN (DRAIN) IMPLANT
GAUZE SPONGE 4X4 12PLY STRL (GAUZE/BANDAGES/DRESSINGS) ×2 IMPLANT
GLOVE BIO SURGEON STRL SZ8.5 (GLOVE) ×4 IMPLANT
GLOVE BIOGEL PI IND STRL 8.5 (GLOVE) ×1 IMPLANT
GLOVE BIOGEL PI INDICATOR 8.5 (GLOVE) ×1
GOWN SPEC L3 XXLG W/TWL (GOWN DISPOSABLE) ×2 IMPLANT
HANDPIECE INTERPULSE COAX TIP (DISPOSABLE) ×2
HOLDER FOLEY CATH W/STRAP (MISCELLANEOUS) ×2 IMPLANT
HOOD PEEL AWAY FLYTE STAYCOOL (MISCELLANEOUS) ×6 IMPLANT
INSERT TIBIAL KNEE  9 SZ 7 (Insert) ×2 IMPLANT
INSERT TIBIAL KNEE 9 SZ 7 (Insert) IMPLANT
JET LAVAGE IRRISEPT WOUND (IRRIGATION / IRRIGATOR) ×2
KIT TURNOVER KIT A (KITS) IMPLANT
KNEE TIBIAL COMPONENT SZ7 (Knees) ×1 IMPLANT
LAVAGE JET IRRISEPT WOUND (IRRIGATION / IRRIGATOR) ×1 IMPLANT
MARKER SKIN DUAL TIP RULER LAB (MISCELLANEOUS) ×2 IMPLANT
NDL SAFETY ECLIPSE 18X1.5 (NEEDLE) ×1 IMPLANT
NDL SPNL 18GX3.5 QUINCKE PK (NEEDLE) ×1 IMPLANT
NEEDLE HYPO 18GX1.5 SHARP (NEEDLE) ×2
NEEDLE SPNL 18GX3.5 QUINCKE PK (NEEDLE) ×2 IMPLANT
NS IRRIG 1000ML POUR BTL (IV SOLUTION) ×2 IMPLANT
PACK TOTAL KNEE CUSTOM (KITS) ×2 IMPLANT
PADDING CAST COTTON 6X4 STRL (CAST SUPPLIES) ×2 IMPLANT
PATELLA ASYMMETRIC 38X11 KNEE (Orthopedic Implant) ×1 IMPLANT
PENCIL SMOKE EVACUATOR (MISCELLANEOUS) IMPLANT
PIN FLUTED HEDLESS FIX 3.5X1/8 (PIN) ×1 IMPLANT
PROTECTOR NERVE ULNAR (MISCELLANEOUS) ×2 IMPLANT
SAW OSC TIP CART 19.5X105X1.3 (SAW) ×2 IMPLANT
SEALER BIPOLAR AQUA 6.0 (INSTRUMENTS) ×2 IMPLANT
SET HNDPC FAN SPRY TIP SCT (DISPOSABLE) ×1 IMPLANT
SET PAD KNEE POSITIONER (MISCELLANEOUS) ×2 IMPLANT
SPONGE DRAIN TRACH 4X4 STRL 2S (GAUZE/BANDAGES/DRESSINGS) IMPLANT
SUT MNCRL AB 3-0 PS2 18 (SUTURE) ×2 IMPLANT
SUT MNCRL AB 4-0 PS2 18 (SUTURE) ×2 IMPLANT
SUT MON AB 2-0 CT1 36 (SUTURE) ×2 IMPLANT
SUT STRATAFIX PDO 1 14 VIOLET (SUTURE) ×2
SUT STRATFX PDO 1 14 VIOLET (SUTURE) ×1
SUT VIC AB 1 CTX 36 (SUTURE) ×4
SUT VIC AB 1 CTX36XBRD ANBCTR (SUTURE) ×2 IMPLANT
SUT VIC AB 2-0 CT1 27 (SUTURE) ×2
SUT VIC AB 2-0 CT1 TAPERPNT 27 (SUTURE) ×1 IMPLANT
SUTURE STRATFX PDO 1 14 VIOLET (SUTURE) ×1 IMPLANT
SYR 3ML LL SCALE MARK (SYRINGE) ×2 IMPLANT
TOWER CARTRIDGE SMART MIX (DISPOSABLE) IMPLANT
TRAY FOLEY MTR SLVR 16FR STAT (SET/KITS/TRAYS/PACK) IMPLANT
TRI CRU RETN FEM SZ 8 RT KNEE (Orthopedic Implant) ×2 IMPLANT
WATER STERILE IRR 1000ML POUR (IV SOLUTION) ×4 IMPLANT

## 2020-03-14 NOTE — Progress Notes (Signed)
Pt refused CPAP qhs.  Pt states the while he wears it at home, he will only be here for one night and will go without it for one night.  Pt encouraged to contact RT should he change his mind.

## 2020-03-14 NOTE — Discharge Instructions (Signed)
° °Dr. Medhansh Brinkmeier °Total Joint Specialist °[facility] °[address] °[place], [place] [address] °[phone] ° °TOTAL KNEE REPLACEMENT POSTOPERATIVE DIRECTIONS ° ° ° °Knee Rehabilitation, Guidelines Following Surgery  °Results after knee surgery are often greatly improved when you follow the exercise, range of motion and muscle strengthening exercises prescribed by your doctor. Safety measures are also important to protect the knee from further injury. Any time any of these exercises cause you to have increased pain or swelling in your knee joint, decrease the amount until you are comfortable again and slowly increase them. If you have problems or questions, call your caregiver or physical therapist for advice.  ° °WEIGHT BEARING °Weight bearing as tolerated with assist device (walker, cane, etc) as directed, use it as long as suggested by your surgeon or therapist, typically at least 4-6 weeks. ° °HOME CARE INSTRUCTIONS  °Remove items at home which could result in a fall. This includes throw rugs or furniture in walking pathways.  °Continue medications as instructed at time of discharge. °You may have some home medications which will be placed on hold until you complete the course of blood thinner medication.  °You may start showering once you are discharged home but do not submerge the incision under water. Just pat the incision dry and apply a dry gauze dressing on daily. °Walk with walker as instructed.  °You may resume a sexual relationship in one month or when given the OK by your doctor.  °· Use walker as long as suggested by your caregivers. °· Avoid periods of inactivity such as sitting longer than an hour when not asleep. This helps prevent blood clots.  °You may put full weight on your legs and walk as much as is comfortable.  °You may return to work once you are cleared by your doctor.  °Do not drive a car for 6 weeks or until released by you surgeon.  °· Do not drive  while taking narcotics.  °Wear the elastic stockings for three weeks following surgery during the day but you may remove then at night. °Make sure you keep all of your appointments after your operation with all of your doctors and caregivers. You should call the office at the above phone number and make an appointment for approximately two weeks after the date of your surgery. °Do not remove your surgical dressing. The dressing is waterproof; you may take showers in 3 days, but do not take tub baths or submerge the dressing. °Please pick up a stool softener and laxative for home use as long as you are requiring pain medications. °· ICE to the affected knee every three hours for 30 minutes at a time and then as needed for pain and swelling.  Continue to use ice on the knee for pain and swelling from surgery. You may notice swelling that will progress down to the foot and ankle.  This is normal after surgery.  Elevate the leg when you are not up walking on it.   °It is important for you to complete the blood thinner medication as prescribed by your doctor. °· Continue to use the breathing machine which will help keep your temperature down.  It is common for your temperature to cycle up and down following surgery, especially at night when you are not up moving around and exerting yourself.  The breathing machine keeps your lungs expanded and your temperature down. ° °RANGE OF MOTION AND STRENGTHENING EXERCISES  °Rehabilitation of the knee is important following   a knee injury or an operation. After just a few days of immobilization, the muscles of the thigh which control the knee become weakened and shrink (atrophy). Knee exercises are designed to build up the tone and strength of the thigh muscles and to improve knee motion. Often times heat used for twenty to thirty minutes before working out will loosen up your tissues and help with improving the range of motion but do not use heat for the first two weeks following  surgery. These exercises can be done on a training (exercise) mat, on the floor, on a table or on a bed. Use what ever works the best and is most comfortable for you Knee exercises include:  °Leg Lifts - While your knee is still immobilized in a splint or cast, you can do straight leg raises. Lift the leg to 60 degrees, hold for 3 sec, and slowly lower the leg. Repeat 10-20 times 2-3 times daily. Perform this exercise against resistance later as your knee gets better.  °Quad and Hamstring Sets - Tighten up the muscle on the front of the thigh (Quad) and hold for 5-10 sec. Repeat this 10-20 times hourly. Hamstring sets are done by pushing the foot backward against an object and holding for 5-10 sec. Repeat as with quad sets.  °A rehabilitation program following serious knee injuries can speed recovery and prevent re-injury in the future due to weakened muscles. Contact your doctor or a physical therapist for more information on knee rehabilitation.  ° °SKILLED REHAB INSTRUCTIONS: °If the patient is transferred to a skilled rehab facility following release from the hospital, a list of the current medications will be sent to the facility for the patient to continue.  When discharged from the skilled rehab facility, please have the facility set up the patient's Home Health Physical Therapy prior to being released. Also, the skilled facility will be responsible for providing the patient with their medications at time of release from the facility to include their pain medication, the muscle relaxants, and their blood thinner medication. If the patient is still at the rehab facility at time of the two week follow up appointment, the skilled rehab facility will also need to assist the patient in arranging follow up appointment in our office and any transportation needs. ° °MAKE SURE YOU:  °Understand these instructions.  °Will watch your condition.  °Will get help right away if you are not doing well or get worse.   ° ° °Pick up stool softner and laxative for home use following surgery while on pain medications. °Do NOT remove your dressing. You may shower.  °Do not take tub baths or submerge incision under water. °May shower starting three days after surgery. °Please use a clean towel to pat the incision dry following showers. °Continue to use ice for pain and swelling after surgery. °Do not use any lotions or creams on the incision until instructed by your surgeon. ° °

## 2020-03-14 NOTE — Evaluation (Signed)
Physical Therapy Evaluation Patient Details Name: Eric Stokes MRN: [id number] DOB: 1945-12-01 Today's Date: 03/14/2020   History of Present Illness  Patient is 74 y.o. male s/p Rt TKA on 03/14/20 with PMH significant for prostate cancer, OA, obesity, TIA, COPD, HTN, Asthma.    Clinical Impression  Eric Stokes is a 74 y.o. male POD 0 s/p Rt TKA. Patient reports independence with mobility at baseline. Patient is now limited by functional impairments (see PT problem list below) and requires min assist for transfers and gait with RW. Patient was able to ambulate ~65 feet with RW and min assist. Patient instructed in exercise to facilitate ROM and circulation. Patient will benefit from continued skilled PT interventions to address impairments and progress towards PLOF. Acute PT will follow to progress mobility and stair training in preparation for safe discharge home.     Follow Up Recommendations Follow surgeon's recommendation for DC plan and follow-up therapies;Outpatient PT    Equipment Recommendations  None recommended by PT (pt getting DME through [place])    Recommendations for Other Services       Precautions / Restrictions Precautions Precautions: Fall Restrictions Weight Bearing Restrictions: No      Mobility  Bed Mobility Overal bed mobility: Needs Assistance Bed Mobility: Supine to Sit     Supine to sit: Min assist        Transfers Overall transfer level: Needs assistance Equipment used: Rolling walker (2 wheeled) Transfers: Sit to/from Stand Sit to Stand: Min assist;From elevated surface         General transfer comment: VC's for technique with RW, assist to steady rising.  Ambulation/Gait Ambulation/Gait assistance: Min assist Gait Distance (Feet): 65 Feet Assistive device: Rolling walker (2 wheeled) Gait Pattern/deviations: Step-to pattern;Decreased stride length;Decreased stance time - right;Decreased weight shift to right Gait velocity:  decr   General Gait Details: VC's for step pattern/proximity to RW. no overt LOB or buckling at Rt knee.   Stairs            Wheelchair Mobility    Modified Rankin (Stroke Patients Only)       Balance Overall balance assessment: Needs assistance Sitting-balance support: Feet supported Sitting balance-Leahy Scale: Good     Standing balance support: During functional activity;Bilateral upper extremity supported Standing balance-Leahy Scale: Fair              Pertinent Vitals/Pain Pain Assessment: 0-10 Pain Score: 3  Pain Location: Rt knee Pain Descriptors / Indicators: Aching;Discomfort;Sore Pain Intervention(s): Monitored during session;Limited activity within patient's tolerance;Repositioned;Ice applied;Premedicated before session    Home Living Family/patient expects to be discharged to:: Private residence Living Arrangements: Spouse/significant other Available Help at Discharge: Family Type of Home: House Home Access: Stairs to enter;Ramped entrance   [place] of Steps: [place]: [place]: Environmental consultant - 2 wheels;Cane - single point ([facility] ordered BSC, ice machine, and SPC (have not arrived))      Prior Function Level of Independence: Independent           Hand Dominance   Dominant Hand: Right    Extremity/Trunk Assessment   Upper Extremity Assessment Upper Extremity Assessment: Overall WFL for tasks assessed    Lower Extremity Assessment Lower Extremity Assessment: RLE deficits/detail RLE Deficits / Details: good quad activation, no extensor lag with SLR. RLE Sensation: WNL RLE Coordination: WNL    Cervical / Trunk Assessment Cervical / Trunk Assessment: Normal  Communication   Communication: HOH  Cognition Arousal/Alertness: Awake/alert Behavior During Therapy: [facility]  for tasks assessed/performed Overall Cognitive Status: Within Functional Limits for tasks assessed         General Comments       Exercises Total Joint Exercises Ankle Circles/Pumps: AROM;Both;20 reps;Seated Heel Slides: AROM;Right;5 reps;Seated   Assessment/Plan    PT Assessment Patient needs continued PT services  PT Problem List Decreased strength;Decreased range of motion;Decreased activity tolerance;Decreased balance;Decreased mobility;Decreased knowledge of use of DME       PT Treatment Interventions DME instruction;Gait training;Stair training;Functional mobility training;Therapeutic activities;Therapeutic exercise;Balance training;Patient/family education    PT Goals (Current goals can be found in the Care Plan section)  Acute Rehab PT Goals Patient Stated Goal: get back to independence PT Goal Formulation: With patient Time For Goal Achievement: 03/21/20 Potential to Achieve Goals: Good    Frequency 7X/week    AM-PAC PT "6 Clicks" Mobility  Outcome Measure Help needed turning from your back to your side while in a flat bed without using bedrails?: A Little Help needed moving from lying on your back to sitting on the side of a flat bed without using bedrails?: A Little Help needed moving to and from a bed to a chair (including a wheelchair)?: A Little Help needed standing up from a chair using your arms (e.g., wheelchair or bedside chair)?: A Little Help needed to walk in hospital room?: A Little Help needed climbing 3-5 steps with a railing? : A Lot 6 Click Score: 17    End of Session Equipment Utilized During Treatment: Gait belt Activity Tolerance: Patient tolerated treatment well Patient left: in chair;with call bell/phone within reach;with chair alarm set Nurse Communication: Mobility status PT Visit Diagnosis: Muscle weakness (generalized) (M62.81);Difficulty in walking, not elsewhere classified (R26.2)    Time: 3536-1443 PT Time Calculation (min) (ACUTE ONLY): 39 min   Charges:   PT Evaluation $PT Eval Low Complexity: 1 Low PT Treatments $Gait Training: 8-22 mins $Therapeutic  Exercise: 8-22 mins        Verner Mould, DPT Acute Rehabilitation Services  Office [phone] Pager [phone]  03/14/2020 7:41 PM

## 2020-03-14 NOTE — Op Note (Signed)
OPERATIVE REPORT  SURGEON: Rod Can, MD   ASSISTANT: Cherlynn June, PA-C Nehemiah Massed, PA-C  PREOPERATIVE DIAGNOSIS: Right knee arthritis.   POSTOPERATIVE DIAGNOSIS: Right knee arthritis.   PROCEDURE: Right total knee arthroplasty.   IMPLANTS: Stryker Triathlon CR femur, size 8. Stryker Tritanium tibia, size 7. X3 polyethelyene insert, size 9 mm, CR. 3 button asymmetric patella, size 38 mm.  ANESTHESIA:  MAC, Regional and Spinal  TOURNIQUET TIME: Not utilized.   ESTIMATED BLOOD LOSS:-100 mL    ANTIBIOTICS: 2 g Ancef.  DRAINS: None.  COMPLICATIONS: None   CONDITION: PACU - hemodynamically stable.   BRIEF CLINICAL NOTE: Eric Stokes is a 74 y.o. male with a long-standing history of Right knee arthritis. After failing conservative management, the patient was indicated for total knee arthroplasty. The risks, benefits, and alternatives to the procedure were explained, and the patient elected to proceed.  PROCEDURE IN DETAIL: Adductor canal block was obtained in the pre-op holding area. Once inside the operative room, spinal anesthesia was obtained, and a foley catheter was inserted. The patient was then positioned, a nonsterile tourniquet was placed, and the lower extremity was prepped and draped in the normal sterile surgical fashion.  A time-out was called verifying side and site of surgery. The patient received IV antibiotics within 60 minutes of beginning the procedure. The tourniquet was not utilized.   An anterior approach to the knee was performed utilizing a midvastus arthrotomy. A medial release was performed and the patellar fat pad was excised. Stryker navigation was used to cut the distal femur perpendicular to the mechanical axis. A freehand patellar resection was performed, and the patella was sized an prepared with 3 lug holes.  Nagivation was used to make  a neutral proximal tibia resection, taking 9 mm of bone from the less affected lateral side with 3 degrees of slope. The menisci were excised. A spacer block was placed, and the alignment and balance in extension were confirmed.   The distal femur was sized using the 3-degree external rotation guide referencing the posterior femoral cortex. The appropriate 4-in-1 cutting block was pinned into place. Rotation was checked using Whiteside's line, the epicondylar axis, and then confirmed with a spacer block in flexion. The remaining femoral cuts were performed, taking care to protect the MCL.  The tibia was sized and the trial tray was pinned into place. The remaining trail components were inserted. The knee was stable to varus and valgus stress through a full range of motion. The patella tracked centrally, and the PCL was well balanced. The trial components were removed, and the proximal tibial surface was prepared. Final components were impacted into place. The knee was tested for a final time and found to be well balanced.   The wound was copiously irrigated with Irrisept solution and normal saline using pule lavage.  Marcaine solution was injected into the periarticular soft tissue.  The wound was closed in layers using #1 Vicryl and Stratafix for the fascia, 2-0 Vicryl for the subcutaneous fat, 2-0 Monocryl for the deep dermal layer, 3-0 running Monocryl subcuticular Stitch, and 4-0 Monocryl stay sutures at both ends of the wound. Dermabond was applied to the skin.  Once the glue was fully dried, an Aquacell Ag and compressive dressing were applied.  Tthe patient was transported to the recovery room in stable condition.  Sponge, needle, and instrument counts were correct at the end of the case x2.  The patient tolerated the procedure well and there were no known complications.  Please note that a surgical assistant was a medical necessity for this procedure in order to perform it in a safe and expeditious  manner. Surgical assistant was necessary to retract the ligaments and vital neurovascular structures to prevent injury to them and also necessary for proper positioning of the limb to allow for anatomic placement of the prosthesis.

## 2020-03-14 NOTE — Anesthesia Postprocedure Evaluation (Signed)
Anesthesia Post Note  Patient: Eric Stokes  Procedure(s) Performed: COMPUTER ASSISTED TOTAL KNEE ARTHROPLASTY (Right Knee)     Patient location during evaluation: PACU Anesthesia Type: Spinal Level of consciousness: oriented and awake and alert Pain management: pain level controlled Vital Signs Assessment: post-procedure vital signs reviewed and stable Respiratory status: spontaneous breathing, respiratory function stable and patient connected to nasal cannula oxygen Cardiovascular status: blood pressure returned to baseline and stable Postop Assessment: no headache, no backache and no apparent nausea or vomiting Anesthetic complications: no   No complications documented.  Last Vitals:  Vitals:   03/14/20 1615 03/14/20 1632  BP: (!) 144/91 (!) 135/83  Pulse: 59 60  Resp: 16 16  Temp: (!) 36.1 C 36.5 C  SpO2: 100% 99%    Last Pain:  Vitals:   03/14/20 1632  TempSrc: Axillary  PainSc: 0-No pain                 Tlaloc Taddei S

## 2020-03-14 NOTE — Progress Notes (Signed)
Assisted Dr. Rose with right, ultrasound guided, adductor canal block. Side rails up, monitors on throughout procedure. See vital signs in flow sheet. Tolerated Procedure well.  

## 2020-03-14 NOTE — Interval H&P Note (Signed)
History and Physical Interval Note:  03/14/2020 9:53 AM  Eric Stokes  has presented today for surgery, with the diagnosis of Degnerative joint disease right knee.  The various methods of treatment have been discussed with the patient and family. After consideration of risks, benefits and other options for treatment, the patient has consented to  Procedure(s): COMPUTER ASSISTED TOTAL KNEE ARTHROPLASTY (Right) as a surgical intervention.  The patient's history has been reviewed, patient examined, no change in status, stable for surgery.  I have reviewed the patient's chart and labs.  Questions were answered to the patient's satisfaction.     Hilton Cork Meara Wiechman

## 2020-03-14 NOTE — Transfer of Care (Signed)
Immediate Anesthesia Transfer of Care Note  Patient: Eric Stokes  Procedure(s) Performed: COMPUTER ASSISTED TOTAL KNEE ARTHROPLASTY (Right Knee)  Patient Location: PACU  Anesthesia Type:Spinal  Level of Consciousness: drowsy, patient cooperative and responds to stimulation  Airway & Oxygen Therapy: Patient Spontanous Breathing and Patient connected to face mask oxygen  Post-op Assessment: Report given to RN and Post -op Vital signs reviewed and stable  Post vital signs: Reviewed and stable  Last Vitals:  Vitals Value Taken Time  BP 120/69 03/14/20 1517  Temp    Pulse 66 03/14/20 1519  Resp 17 03/14/20 1519  SpO2 98 % 03/14/20 1519  Vitals shown include unvalidated device data.  Last Pain:  Vitals:   03/14/20 1005  TempSrc:   PainSc: 2          Complications: No complications documented.

## 2020-03-14 NOTE — Anesthesia Procedure Notes (Signed)
Spinal  Patient location during procedure: OR Start time: 03/14/2020 11:40 AM End time: 03/14/2020 11:45 AM Staffing Anesthesiologist: Myrtie Soman, MD Preanesthetic Checklist Completed: patient identified, IV checked, site marked, risks and benefits discussed, surgical consent, monitors and equipment checked, pre-op evaluation and timeout performed Spinal Block Patient position: sitting Prep: DuraPrep Patient monitoring: heart rate, cardiac monitor, continuous pulse ox and blood pressure Approach: midline Location: L3-4 Injection technique: single-shot Needle Needle type: Sprotte  Needle gauge: 24 G Needle length: 9 cm Assessment Sensory level: T6 Events: paresthesia Additional Notes Transient paresthesia on R

## 2020-03-14 NOTE — Anesthesia Procedure Notes (Signed)
Anesthesia Regional Block: Adductor canal block   Pre-Anesthetic Checklist: ,, timeout performed, Correct Patient, Correct Site, Correct Laterality, Correct Procedure, Correct Position, site marked, Risks and benefits discussed,  Surgical consent,  Pre-op evaluation,  At surgeon's request and post-op pain management  Laterality: Right  Prep: chloraprep       Needles:  Injection technique: Single-shot  Needle Type: Echogenic Needle     Needle Length: 9cm      Additional Needles:   Procedures:,,,, ultrasound used (permanent image in chart),,,,  Narrative:  Start time: 03/14/2020 9:56 AM End time: 03/14/2020 10:02 AM Injection made incrementally with aspirations every 5 mL.  Performed by: Personally  Anesthesiologist: Myrtie Soman, MD  Additional Notes: Patient tolerated the procedure well without complications

## 2020-03-14 NOTE — Anesthesia Procedure Notes (Signed)
Procedure Name: MAC Date/Time: 03/14/2020 11:41 AM Performed by: Eben Burow, CRNA Pre-anesthesia Checklist: Patient identified, Emergency Drugs available, Suction available, Patient being monitored and Timeout performed Oxygen Delivery Method: Simple face mask Placement Confirmation: positive ETCO2

## 2020-03-14 NOTE — Anesthesia Procedure Notes (Signed)
Anesthesia Procedure Image    

## 2020-03-15 ENCOUNTER — Encounter (HOSPITAL_COMMUNITY): Payer: Self-pay | Admitting: Orthopedic Surgery

## 2020-03-15 DIAGNOSIS — M1711 Unilateral primary osteoarthritis, right knee: Secondary | ICD-10-CM | POA: Diagnosis not present

## 2020-03-15 LAB — BASIC METABOLIC PANEL
Anion gap: 9 (ref 5–15)
BUN: 19 mg/dL (ref 8–23)
CO2: 21 mmol/L — ABNORMAL LOW (ref 22–32)
Calcium: 8.3 mg/dL — ABNORMAL LOW (ref 8.9–10.3)
Chloride: 107 mmol/L (ref 98–111)
Creatinine, Ser: 0.8 mg/dL (ref 0.61–1.24)
GFR calc Af Amer: >60 mL/min (ref >60)
GFR calc non Af Amer: >60 mL/min (ref >60)
Glucose, Bld: 124 mg/dL — ABNORMAL HIGH (ref 70–99)
Potassium: 4.4 mmol/L (ref 3.5–5.1)
Sodium: 137 mmol/L (ref 135–145)

## 2020-03-15 LAB — CBC
HCT: 33.9 % — ABNORMAL LOW (ref 39.0–52.0)
Hemoglobin: 11.3 g/dL — ABNORMAL LOW (ref 13.0–17.0)
MCH: 30.6 pg (ref 26.0–34.0)
MCHC: 33.3 g/dL (ref 30.0–36.0)
MCV: 91.9 fL (ref 80.0–100.0)
Platelets: 162 10*3/uL (ref 150–400)
RBC: 3.69 MIL/uL — ABNORMAL LOW (ref 4.22–5.81)
RDW: 14.2 % (ref 11.5–15.5)
WBC: 8.4 10*3/uL (ref 4.0–10.5)
nRBC: 0 % (ref 0.0–0.2)

## 2020-03-15 MED ORDER — FINASTERIDE 5 MG PO TABS: 5.0000 mg | ORAL_TABLET | Freq: Every day | ORAL | Status: DC

## 2020-03-15 MED ORDER — POLYETHYLENE GLYCOL 3350 17 G PO PACK: 17.0000 g | PACK | Freq: Every day | ORAL | 0 refills | Status: AC | PRN

## 2020-03-15 MED ORDER — LORATADINE 10 MG PO TABS: 10.0000 mg | ORAL_TABLET | Freq: Every day | ORAL | Status: DC

## 2020-03-15 MED ORDER — LOSARTAN POTASSIUM 25 MG PO TABS: 25.0000 mg | ORAL_TABLET | Freq: Every day | ORAL | Status: DC

## 2020-03-15 MED ORDER — DOCUSATE SODIUM 100 MG PO CAPS: 100.0000 mg | ORAL_CAPSULE | Freq: Two times a day (BID) | ORAL | 0 refills | Status: AC

## 2020-03-15 MED ORDER — ONDANSETRON HCL 4 MG PO TABS: 4.0000 mg | ORAL_TABLET | Freq: Four times a day (QID) | ORAL | 0 refills | Status: DC | PRN

## 2020-03-15 MED ORDER — APIXABAN 2.5 MG PO TABS
2.5000 mg | ORAL_TABLET | Freq: Two times a day (BID) | ORAL | 0 refills | Status: DC
Start: 2020-03-15 — End: 2020-05-30

## 2020-03-15 MED ORDER — HYDROCODONE-ACETAMINOPHEN 5-325 MG PO TABS: 1.0000 | ORAL_TABLET | ORAL | 0 refills | Status: DC | PRN

## 2020-03-15 NOTE — Plan of Care (Signed)
Patient verbalized understanding of all instructions

## 2020-03-15 NOTE — Progress Notes (Signed)
Physical Therapy Treatment Patient Details Name: Eric Stokes MRN: [id number] DOB: Mar 27, 1946 Today's Date: 03/15/2020    History of Present Illness Patient is 74 y.o. male s/p Rt TKA on 03/14/20 with PMH significant for prostate cancer, OA, obesity, TIA, COPD, HTN, Asthma.    PT Comments    Pt very motivated and progressing well with mobility.  Pt up to ambulate increased distance in hall, up to bathroom and performed HEP with assist - written instruction provided and reviewed.   Follow Up Recommendations  Follow surgeon's recommendation for DC plan and follow-up therapies;Outpatient PT     Equipment Recommendations  None recommended by PT    Recommendations for Other Services       Precautions / Restrictions Precautions Precautions: Fall Restrictions Weight Bearing Restrictions: No    Mobility  Bed Mobility Overal bed mobility: Needs Assistance Bed Mobility: Supine to Sit     Supine to sit: Supervision        Transfers Overall transfer level: Needs assistance Equipment used: Rolling walker (2 wheeled) Transfers: Sit to/from Stand Sit to Stand: Min guard;Supervision         General transfer comment: cues for LE management and use of UEs to self assist  Ambulation/Gait Ambulation/Gait assistance: Min guard Gait Distance (Feet): 125 Feet Assistive device: Rolling walker (2 wheeled) Gait Pattern/deviations: Step-to pattern;Decreased stride length;Decreased stance time - right;Decreased weight shift to right Gait velocity: decr   General Gait Details: cues for sequence, posture and position from [employer]             Wheelchair Mobility    Modified Rankin (Stroke Patients Only)       Balance Overall balance assessment: Needs assistance Sitting-balance support: Feet supported Sitting balance-Leahy Scale: Good     Standing balance support: During functional activity;Bilateral upper extremity supported Standing balance-Leahy Scale:  Fair                              Cognition Arousal/Alertness: Awake/alert Behavior During Therapy: WFL for tasks assessed/performed Overall Cognitive Status: Within Functional Limits for tasks assessed                                        Exercises Total Joint Exercises Ankle Circles/Pumps: AROM;Both;20 reps;Seated Quad Sets: AROM;Both;10 reps;Supine Heel Slides: Right;AAROM;15 reps;Supine Hip ABduction/ADduction: AROM;Right;10 reps;Supine Straight Leg Raises: AAROM;AROM;Right;15 reps;Supine Long Arc Quad: AAROM;Right;5 reps;Seated Knee Flexion: AAROM;Right;5 reps;Seated    General Comments        Pertinent Vitals/Pain Pain Assessment: 0-10 Pain Score: 3  Pain Location: Rt knee Pain Descriptors / Indicators: Aching;Discomfort;Sore Pain Intervention(s): Premedicated before session;Limited activity within patient's tolerance;Monitored during session;Ice applied    Home Living                      Prior Function            PT Goals (current goals can now be found in the care plan section) Acute Rehab PT Goals Patient Stated Goal: get back to independence PT Goal Formulation: With patient Time For Goal Achievement: 03/21/20 Potential to Achieve Goals: Good Progress towards PT goals: Progressing toward goals    Frequency    7X/week      PT Plan Current plan remains appropriate    Co-evaluation  AM-PAC PT "6 Clicks" Mobility   Outcome Measure  Help needed turning from your back to your side while in a flat bed without using bedrails?: A Little Help needed moving from lying on your back to sitting on the side of a flat bed without using bedrails?: A Little Help needed moving to and from a bed to a chair (including a wheelchair)?: A Little Help needed standing up from a chair using your arms (e.g., wheelchair or bedside chair)?: A Little Help needed to walk in hospital room?: A Little Help needed  climbing 3-5 steps with a railing? : A Little 6 Click Score: 18    End of Session Equipment Utilized During Treatment: Gait belt Activity Tolerance: Patient tolerated treatment well Patient left: in chair;with call bell/phone within reach;with chair alarm set Nurse Communication: Mobility status PT Visit Diagnosis: Muscle weakness (generalized) (M62.81);Difficulty in walking, not elsewhere classified (R26.2)     Time: 1124-1209 PT Time Calculation (min) (ACUTE ONLY): 45 min  Charges:  $Gait Training: 8-22 mins $Therapeutic Exercise: 23-37 mins                     [place] Pager [phone] Office [phone]    Eric Stokes 03/15/2020, 12:56 PM

## 2020-03-15 NOTE — TOC Transition Note (Signed)
Transition of Care [facility]) - CM/SW Discharge Note   Patient Details  Name: Eric Stokes MRN: [id number] Date of Birth: 1946/06/19  [facility] CM/SW Contact:  Lia Hopping, LCSW Phone Number: 03/15/2020, 10:04 AM   Clinical Narrative:    Therapy Plan: OPPT Patient confirm he received his RW from the [place].    Final next level of care: OP Rehab Barriers to Discharge: No Barriers Identified   Patient Goals and CMS Choice     Choice offered to / list presented to : NA  Discharge Placement                       Discharge Plan and Services                DME Arranged: N/A DME Agency: NA         HH Agency: NA        Social Determinants of Health (SDOH) Interventions     Readmission Risk Interventions No flowsheet data found.

## 2020-03-15 NOTE — Progress Notes (Signed)
All discharge instructions reviewed with patient and spouse, both verbalized understanding of both dc instructions and medications. Both instructions and hard copy of prescriptions were handed to spouse. Deanna Artis Rn

## 2020-03-15 NOTE — Discharge Summary (Signed)
Physician Discharge Summary  Patient ID: Eric Stokes MRN: [id number] DOB/AGE: May 04, 1946 74 y.o.  Admit date: 03/14/2020 Discharge date: 03/15/2020  Admission Diagnoses:  Osteoarthritis of right knee  Discharge Diagnoses:  Principal Problem:   Osteoarthritis of right knee   Past Medical History:  Diagnosis Date  . Aortic stenosis, mild    noted on ECHO  . Asthma   . Benign essential hypertension, on Norvasc and Amlodipine 12/21/2008   Followed by [facility]   . BPH (benign prostatic hyperplasia) 09/03/2012  . Carotid bruit present 06/15/2018   2015 Carotid Dopplers:  Essentially normal carotid arteries, with very slight hard plaque in the proximal ICA's and serpentine distal vessels. 1-39% bilateral ICA stenosis. Patent vertebral arteries with antegrade flow. Normal subclavian arteries, bilaterally.  . Colonic polyp 01/09/2020  . COPD (chronic obstructive pulmonary disease) ([place])   . Dyslipidemia 12/21/2008   Qualifier: Diagnosis of  By: Percival Spanish, MD, Farrel Gordon    . Essential hypertension, benign 12/21/2008   Qualifier: Diagnosis of  By: Percival Spanish, MD, Farrel Gordon    . Former smoker, stopped smoking in distant past 03/15/2018  . Hematuria 09/16/2011  . History of migraine   . OA (osteoarthritis)   . Obese   . OSA on CPAP 09/25/2015   Diagnosed at the [facility] 2016   . Prostate cancer ([place]) 11/30/2019  . PVC's (premature ventricular contractions)   . Seasonal allergies   . Sinus bradycardia   . Squamous cell carcinoma of skin   . TIA (transient ischemic attack)    questionable  . Vitamin B 12 deficiency   . Vitamin D deficiency 11/26/2010    Surgeries: Procedure(s): COMPUTER ASSISTED TOTAL KNEE ARTHROPLASTY on 03/14/2020   Consultants (if any):   Discharged Condition: Improved  Hospital Course: Eric Stokes is an 74 y.o. male who was admitted 03/14/2020 with a diagnosis of Osteoarthritis of right knee and went to the operating room on 03/14/2020 and  underwent the above named procedures.    He was given perioperative antibiotics:  Anti-infectives (From admission, onward)   Start     Dose/Rate Route Frequency Ordered Stop   03/14/20 1800  ceFAZolin (ANCEF) IVPB 2g/100 mL premix        2 g 200 mL/hr over 30 Minutes Intravenous Every 6 hours 03/14/20 1628 03/15/20 0038   03/14/20 0830  ceFAZolin (ANCEF) IVPB 2g/100 mL premix        2 g 200 mL/hr over 30 Minutes Intravenous On call to O.R. 03/14/20 8841 03/14/20 1146    .  He was given sequential compression devices, early ambulation, and apixaban for DVT prophylaxis.  He benefited maximally from the hospital stay and there were no complications.    Recent vital signs:  Vitals:   03/15/20 0526 03/15/20 0914  BP: (!) 134/84 (!) 143/78  Pulse: 54 57  Resp: 18 18  Temp: 97.8 F (36.6 C) 97.6 F (36.4 C)  SpO2: 99% 100%    Recent laboratory studies:  Lab Results  Component Value Date   HGB 11.3 (L) 03/15/2020   HGB 14.2 02/27/2020   HGB 14.1 01/09/2020   Lab Results  Component Value Date   WBC 8.4 03/15/2020   PLT 162 03/15/2020   Lab Results  Component Value Date   INR 1.1 02/27/2020   Lab Results  Component Value Date   NA 137 03/15/2020   K 4.4 03/15/2020   CL 107 03/15/2020   CO2 21 (L) 03/15/2020   BUN 19 03/15/2020  CREATININE 0.80 03/15/2020   GLUCOSE 124 (H) 03/15/2020    Discharge Medications:   Allergies as of 03/15/2020      Reactions   Tamsulosin Other (See Comments)   dizziness   Cipro [ciprofloxacin Hcl] Swelling   Knee swelling, joint pain   Lipitor [atorvastatin] Other (See Comments)   Leg Cramps   Lisinopril Cough      Medication List    STOP taking these medications   aspirin 81 MG EC tablet   cyanocobalamin 1000 MCG/ML injection Commonly known as: (VITAMIN B-12)   SYRINGE 3CC/25GX1" 25G X 1" 3 ML Misc     TAKE these medications   albuterol 108 (90 Base) MCG/ACT inhaler Commonly known as: VENTOLIN HFA Inhale 2 puffs into  the lungs every 6 (six) hours as needed for wheezing or shortness of breath.   alfuzosin 10 MG 24 hr tablet Commonly known as: UROXATRAL Take 5 mg by mouth in the morning and at bedtime.   apixaban 2.5 MG Tabs tablet Commonly known as: Eliquis Take 1 tablet (2.5 mg total) by mouth 2 (two) times daily.   carboxymethylcellulose 0.5 % Soln Commonly known as: REFRESH PLUS Place 1 drop into both eyes 2 (two) times daily as needed (dry eyes).   cetirizine 10 MG tablet Commonly known as: ZYRTEC Take 10 mg by mouth daily.   docusate sodium 100 MG capsule Commonly known as: COLACE Take 1 capsule (100 mg total) by mouth 2 (two) times daily.   finasteride 5 MG tablet Commonly known as: PROSCAR Take 5 mg by mouth daily.   fluticasone 50 MCG/ACT nasal spray Commonly known as: FLONASE Place 1 spray into both nostrils daily as needed for allergies or rhinitis.   HYDROcodone-acetaminophen 5-325 MG tablet Commonly known as: NORCO/VICODIN Take 1 tablet by mouth every 4 (four) hours as needed for moderate pain (pain score 4-6).   ibuprofen 400 MG tablet Commonly known as: ADVIL Take 400 mg by mouth every 6 (six) hours as needed for moderate pain.   latanoprost 0.005 % ophthalmic solution Commonly known as: XALATAN INSTILL 1 DROP INTO BOTH EYES AT BEDTIME What changed: when to take this   losartan 25 MG tablet Commonly known as: COZAAR TAKE 1 TABLET BY MOUTH EVERY DAY   montelukast 10 MG tablet Commonly known as: SINGULAIR TAKE 1 TABLET BY MOUTH EVERY DAY AT BEDTIME   NON FORMULARY Cpap at night   ondansetron 4 MG tablet Commonly known as: ZOFRAN Take 1 tablet (4 mg total) by mouth every 6 (six) hours as needed for nausea.   polyethylene glycol 17 g packet Commonly known as: MIRALAX / GLYCOLAX Take 17 g by mouth daily as needed for mild constipation.   pravastatin 20 MG tablet Commonly known as: PRAVACHOL Take 20 mg by mouth daily.   Vitamin D3 50 MCG (2000 UT)  Tabs Take 2,000 Int'l Units by mouth daily.            Durable Medical Equipment  (From admission, onward)         Start     Ordered   03/15/20 1225  For home use only DME 3 n 1  Once        03/15/20 1224          Diagnostic Studies: DG Knee Right Port  Result Date: 03/14/2020 CLINICAL DATA:  Follow-up total knee arthroplasty. EXAM: PORTABLE RIGHT KNEE - 1-2 VIEW COMPARISON:  None. FINDINGS: Two-view show total knee arthroplasty. Components appear grossly well positioned. No radiographically  detectable complication. IMPRESSION: Good appearance following total knee arthroplasty. Electronically Signed   By: Nelson Chimes M.D.   On: 03/14/2020 16:12    Disposition: Discharge disposition: 01-Home or Self Care       Discharge Instructions    Call MD / Call 911   Complete by: As directed    If you experience chest pain or shortness of breath, CALL 911 and be transported to the hospital emergency room.  If you develope a fever above 101 F, pus (white drainage) or increased drainage or redness at the wound, or calf pain, call your surgeon's office.   Constipation Prevention   Complete by: As directed    Drink plenty of fluids.  Prune juice may be helpful.  You may use a stool softener, such as Colace (over the counter) 100 mg twice a day.  Use MiraLax (over the counter) for constipation as needed.   Do not put a pillow under the knee. Place it under the heel.   Complete by: As directed    Driving restrictions   Complete by: As directed    No driving for 6 weeks   Increase activity slowly as tolerated   Complete by: As directed    TED hose   Complete by: As directed    Use stockings (TED hose) for 2 weeks on both leg(s).  You may remove them at night for sleeping.       Follow-up Information    Desteni Piscopo, Aaron Edelman, MD. Schedule an appointment as soon as possible for a visit in 2 weeks.   Specialty: Orthopedic Surgery Why: For wound re-check Contact information: [address] [place] [address] [phone]                Signed: Hilton Cork [place] 03/15/2020, 1:26 PM

## 2020-03-15 NOTE — Progress Notes (Signed)
    Subjective:  Patient reports pain as mild to moderate.  Denies N/V/CP/SOB. Endorses flatus.  Objective:   VITALS:   Vitals:   03/14/20 2038 03/15/20 0149 03/15/20 0526 03/15/20 0914  BP: (!) 135/63 124/71 (!) 134/84 (!) 143/78  Pulse: 66 52 54 57  Resp: 16 16 18 18   Temp: 98 F (36.7 C) 97.7 F (36.5 C) 97.8 F (36.6 C) 97.6 F (36.4 C)  TempSrc:    Oral  SpO2: 99% 98% 99% 100%  Weight:      Height:        NAD ABD soft Neurovascular intact Sensation intact distally Intact pulses distally Dorsiflexion/Plantar flexion intact Incision: dressing C/D/I   Lab Results  Component Value Date   WBC 8.4 03/15/2020   HGB 11.3 (L) 03/15/2020   HCT 33.9 (L) 03/15/2020   MCV 91.9 03/15/2020   PLT 162 03/15/2020   BMET    Component Value Date/Time   NA 137 03/15/2020 0249   NA 138 10/19/2017 0000   K 4.4 03/15/2020 0249   CL 107 03/15/2020 0249   CO2 21 (L) 03/15/2020 0249   GLUCOSE 124 (H) 03/15/2020 0249   BUN 19 03/15/2020 0249   BUN 22 (A) 05/04/2014 0000   CREATININE 0.80 03/15/2020 0249   CALCIUM 8.3 (L) 03/15/2020 0249   GFRNONAA >60 03/15/2020 0249   GFRAA >60 03/15/2020 0249     Assessment/Plan: 1 Day Post-Op   Principal Problem:   Osteoarthritis of right knee   WBAT with walker DVT ppx: Apixaban, SCDs, TEDS PO pain control PT/OT Dispo: Home with outpatient PT   Eric Stokes 03/15/2020, 1:17 PM   [facility] is now [employer] [address], [place], [place] [address] Phone: [phone] [email or web address] Facebook  [employer]

## 2020-03-15 NOTE — Plan of Care (Signed)
Patient continuing to progress, will continue to monitor and educate patient on pain management.

## 2020-03-20 ENCOUNTER — Ambulatory Visit: Payer: Medicare Other

## 2020-03-20 ENCOUNTER — Telehealth: Payer: Self-pay | Admitting: Family Medicine

## 2020-03-20 NOTE — Telephone Encounter (Signed)
Because he is managed by urology, I prefer to not check PSA as I cannot interpret the results.  He should get his labs followed by [facility] for his prostate cancer.  IF his urologist would like the psa checked and sent to him/her for interpretation, that is fine.  Thanks.

## 2020-03-20 NOTE — Telephone Encounter (Signed)
Patient has appt 8/25 for lab.  He is requesting PSA to be added to this b/c he has prostate cancer.  Please advise.

## 2020-03-21 NOTE — Telephone Encounter (Signed)
Spoke with the patient and he verbalized understanding the instructions given by Dr. Jonni Sanger in regards to his PSA lab. No other questions or concerns at this time.

## 2020-04-11 ENCOUNTER — Ambulatory Visit: Payer: Medicare Other

## 2020-04-11 ENCOUNTER — Other Ambulatory Visit: Payer: Self-pay

## 2020-04-11 ENCOUNTER — Other Ambulatory Visit: Payer: Medicare Other

## 2020-04-11 ENCOUNTER — Other Ambulatory Visit: Payer: No Typology Code available for payment source

## 2020-04-11 DIAGNOSIS — E785 Hyperlipidemia, unspecified: Secondary | ICD-10-CM

## 2020-04-11 DIAGNOSIS — E559 Vitamin D deficiency, unspecified: Secondary | ICD-10-CM

## 2020-04-11 NOTE — Addendum Note (Signed)
Addended by: Liliane Channel on: 04/11/2020 09:52 AM   Modules accepted: Orders

## 2020-04-12 LAB — LIPID PANEL
Cholesterol: 152 mg/dL (ref <200)
HDL: 39 mg/dL — ABNORMAL LOW (ref >=40)
LDL Cholesterol (Calc): 97 mg/dL (calc)
Non-HDL Cholesterol (Calc): 113 mg/dL (calc) (ref <130)
Total CHOL/HDL Ratio: 3.9 (calc) (ref <5.0)
Triglycerides: 70 mg/dL (ref <150)

## 2020-04-12 LAB — LDL CHOLESTEROL, DIRECT: Direct LDL: 108 mg/dL — ABNORMAL HIGH (ref <100)

## 2020-04-12 LAB — VITAMIN B12: Vitamin B-12: 370 pg/mL (ref 200–1100)

## 2020-04-18 DIAGNOSIS — I2510 Atherosclerotic heart disease of native coronary artery without angina pectoris: Secondary | ICD-10-CM | POA: Diagnosis present

## 2020-04-18 DIAGNOSIS — I480 Paroxysmal atrial fibrillation: Secondary | ICD-10-CM

## 2020-04-18 DIAGNOSIS — I255 Ischemic cardiomyopathy: Secondary | ICD-10-CM

## 2020-04-18 HISTORY — DX: Atherosclerotic heart disease of native coronary artery without angina pectoris: I25.10

## 2020-04-18 HISTORY — DX: Paroxysmal atrial fibrillation: I48.0

## 2020-04-18 HISTORY — DX: Ischemic cardiomyopathy: I25.5

## 2020-04-26 DIAGNOSIS — R350 Frequency of micturition: Secondary | ICD-10-CM | POA: Diagnosis not present

## 2020-05-02 ENCOUNTER — Other Ambulatory Visit: Payer: Self-pay | Admitting: Family Medicine

## 2020-05-04 ENCOUNTER — Other Ambulatory Visit: Payer: Self-pay

## 2020-05-04 MED ORDER — ALBUTEROL SULFATE HFA 108 (90 BASE) MCG/ACT IN AERS: 2.0000 | INHALATION_SPRAY | Freq: Four times a day (QID) | RESPIRATORY_TRACT | 1 refills | Status: DC | PRN

## 2020-05-15 DIAGNOSIS — X32XXXD Exposure to sunlight, subsequent encounter: Secondary | ICD-10-CM | POA: Diagnosis not present

## 2020-05-15 DIAGNOSIS — L308 Other specified dermatitis: Secondary | ICD-10-CM | POA: Diagnosis not present

## 2020-05-15 DIAGNOSIS — L570 Actinic keratosis: Secondary | ICD-10-CM | POA: Diagnosis not present

## 2020-05-16 ENCOUNTER — Inpatient Hospital Stay (HOSPITAL_COMMUNITY)
Admission: EM | Admit: 2020-05-16 | Discharge: 2020-05-30 | DRG: 233 | Disposition: A | Discharge status: Home Health | Payer: No Typology Code available for payment source | Attending: Cardiothoracic Surgery | Admitting: Cardiothoracic Surgery

## 2020-05-16 ENCOUNTER — Emergency Department (HOSPITAL_COMMUNITY): Payer: No Typology Code available for payment source

## 2020-05-16 ENCOUNTER — Inpatient Hospital Stay (HOSPITAL_COMMUNITY): Payer: No Typology Code available for payment source

## 2020-05-16 DIAGNOSIS — I214 Non-ST elevation (NSTEMI) myocardial infarction: Principal | ICD-10-CM | POA: Diagnosis present

## 2020-05-16 DIAGNOSIS — R9431 Abnormal electrocardiogram [ECG] [EKG]: Secondary | ICD-10-CM | POA: Diagnosis present

## 2020-05-16 DIAGNOSIS — R778 Other specified abnormalities of plasma proteins: Secondary | ICD-10-CM | POA: Diagnosis not present

## 2020-05-16 DIAGNOSIS — I708 Atherosclerosis of other arteries: Secondary | ICD-10-CM | POA: Diagnosis not present

## 2020-05-16 DIAGNOSIS — R0902 Hypoxemia: Secondary | ICD-10-CM | POA: Diagnosis not present

## 2020-05-16 DIAGNOSIS — I5023 Acute on chronic systolic (congestive) heart failure: Secondary | ICD-10-CM | POA: Diagnosis present

## 2020-05-16 DIAGNOSIS — E876 Hypokalemia: Secondary | ICD-10-CM | POA: Diagnosis not present

## 2020-05-16 DIAGNOSIS — I2584 Coronary atherosclerosis due to calcified coronary lesion: Secondary | ICD-10-CM | POA: Diagnosis present

## 2020-05-16 DIAGNOSIS — N400 Enlarged prostate without lower urinary tract symptoms: Secondary | ICD-10-CM | POA: Diagnosis present

## 2020-05-16 DIAGNOSIS — I517 Cardiomegaly: Secondary | ICD-10-CM | POA: Diagnosis not present

## 2020-05-16 DIAGNOSIS — I5021 Acute systolic (congestive) heart failure: Secondary | ICD-10-CM | POA: Diagnosis not present

## 2020-05-16 DIAGNOSIS — I2510 Atherosclerotic heart disease of native coronary artery without angina pectoris: Secondary | ICD-10-CM | POA: Diagnosis not present

## 2020-05-16 DIAGNOSIS — I10 Essential (primary) hypertension: Secondary | ICD-10-CM | POA: Diagnosis not present

## 2020-05-16 DIAGNOSIS — J449 Chronic obstructive pulmonary disease, unspecified: Secondary | ICD-10-CM | POA: Diagnosis present

## 2020-05-16 DIAGNOSIS — D62 Acute posthemorrhagic anemia: Secondary | ICD-10-CM | POA: Diagnosis not present

## 2020-05-16 DIAGNOSIS — I081 Rheumatic disorders of both mitral and tricuspid valves: Secondary | ICD-10-CM | POA: Diagnosis not present

## 2020-05-16 DIAGNOSIS — Z96651 Presence of right artificial knee joint: Secondary | ICD-10-CM | POA: Diagnosis present

## 2020-05-16 DIAGNOSIS — Z743 Need for continuous supervision: Secondary | ICD-10-CM | POA: Diagnosis not present

## 2020-05-16 DIAGNOSIS — Z20822 Contact with and (suspected) exposure to covid-19: Secondary | ICD-10-CM | POA: Diagnosis not present

## 2020-05-16 DIAGNOSIS — R079 Chest pain, unspecified: Secondary | ICD-10-CM | POA: Diagnosis not present

## 2020-05-16 DIAGNOSIS — I5043 Acute on chronic combined systolic (congestive) and diastolic (congestive) heart failure: Secondary | ICD-10-CM | POA: Diagnosis not present

## 2020-05-16 DIAGNOSIS — E559 Vitamin D deficiency, unspecified: Secondary | ICD-10-CM | POA: Diagnosis present

## 2020-05-16 DIAGNOSIS — I509 Heart failure, unspecified: Secondary | ICD-10-CM | POA: Diagnosis not present

## 2020-05-16 DIAGNOSIS — Z9889 Other specified postprocedural states: Secondary | ICD-10-CM

## 2020-05-16 DIAGNOSIS — R0789 Other chest pain: Secondary | ICD-10-CM | POA: Diagnosis not present

## 2020-05-16 DIAGNOSIS — I499 Cardiac arrhythmia, unspecified: Secondary | ICD-10-CM | POA: Diagnosis not present

## 2020-05-16 DIAGNOSIS — J811 Chronic pulmonary edema: Secondary | ICD-10-CM | POA: Diagnosis not present

## 2020-05-16 DIAGNOSIS — R570 Cardiogenic shock: Secondary | ICD-10-CM | POA: Diagnosis not present

## 2020-05-16 DIAGNOSIS — E7800 Pure hypercholesterolemia, unspecified: Secondary | ICD-10-CM | POA: Diagnosis not present

## 2020-05-16 DIAGNOSIS — Z951 Presence of aortocoronary bypass graft: Secondary | ICD-10-CM

## 2020-05-16 DIAGNOSIS — C61 Malignant neoplasm of prostate: Secondary | ICD-10-CM | POA: Diagnosis present

## 2020-05-16 DIAGNOSIS — D696 Thrombocytopenia, unspecified: Secondary | ICD-10-CM | POA: Diagnosis not present

## 2020-05-16 DIAGNOSIS — E872 Acidosis: Secondary | ICD-10-CM | POA: Diagnosis not present

## 2020-05-16 DIAGNOSIS — J45998 Other asthma: Secondary | ICD-10-CM | POA: Diagnosis not present

## 2020-05-16 DIAGNOSIS — M19011 Primary osteoarthritis, right shoulder: Secondary | ICD-10-CM | POA: Diagnosis not present

## 2020-05-16 DIAGNOSIS — I4891 Unspecified atrial fibrillation: Secondary | ICD-10-CM | POA: Diagnosis present

## 2020-05-16 DIAGNOSIS — I361 Nonrheumatic tricuspid (valve) insufficiency: Secondary | ICD-10-CM | POA: Diagnosis not present

## 2020-05-16 DIAGNOSIS — I255 Ischemic cardiomyopathy: Secondary | ICD-10-CM | POA: Diagnosis present

## 2020-05-16 DIAGNOSIS — Z833 Family history of diabetes mellitus: Secondary | ICD-10-CM

## 2020-05-16 DIAGNOSIS — E669 Obesity, unspecified: Secondary | ICD-10-CM | POA: Diagnosis present

## 2020-05-16 DIAGNOSIS — I5031 Acute diastolic (congestive) heart failure: Secondary | ICD-10-CM

## 2020-05-16 DIAGNOSIS — E785 Hyperlipidemia, unspecified: Secondary | ICD-10-CM | POA: Diagnosis present

## 2020-05-16 DIAGNOSIS — Z7902 Long term (current) use of antithrombotics/antiplatelets: Secondary | ICD-10-CM

## 2020-05-16 DIAGNOSIS — I25110 Atherosclerotic heart disease of native coronary artery with unstable angina pectoris: Secondary | ICD-10-CM | POA: Diagnosis not present

## 2020-05-16 DIAGNOSIS — I110 Hypertensive heart disease with heart failure: Secondary | ICD-10-CM | POA: Diagnosis not present

## 2020-05-16 DIAGNOSIS — I350 Nonrheumatic aortic (valve) stenosis: Secondary | ICD-10-CM | POA: Diagnosis not present

## 2020-05-16 DIAGNOSIS — Z881 Allergy status to other antibiotic agents status: Secondary | ICD-10-CM

## 2020-05-16 DIAGNOSIS — G4733 Obstructive sleep apnea (adult) (pediatric): Secondary | ICD-10-CM | POA: Diagnosis present

## 2020-05-16 DIAGNOSIS — I351 Nonrheumatic aortic (valve) insufficiency: Secondary | ICD-10-CM | POA: Diagnosis not present

## 2020-05-16 DIAGNOSIS — Z6837 Body mass index (BMI) 37.0-37.9, adult: Secondary | ICD-10-CM | POA: Diagnosis not present

## 2020-05-16 DIAGNOSIS — Z87891 Personal history of nicotine dependence: Secondary | ICD-10-CM

## 2020-05-16 DIAGNOSIS — Z79899 Other long term (current) drug therapy: Secondary | ICD-10-CM

## 2020-05-16 DIAGNOSIS — N179 Acute kidney failure, unspecified: Secondary | ICD-10-CM | POA: Diagnosis not present

## 2020-05-16 DIAGNOSIS — K567 Ileus, unspecified: Secondary | ICD-10-CM | POA: Diagnosis not present

## 2020-05-16 DIAGNOSIS — J9811 Atelectasis: Secondary | ICD-10-CM | POA: Diagnosis not present

## 2020-05-16 DIAGNOSIS — Z452 Encounter for adjustment and management of vascular access device: Secondary | ICD-10-CM | POA: Diagnosis not present

## 2020-05-16 DIAGNOSIS — Z888 Allergy status to other drugs, medicaments and biological substances status: Secondary | ICD-10-CM

## 2020-05-16 DIAGNOSIS — I700 Atherosclerosis of aorta: Secondary | ICD-10-CM | POA: Diagnosis not present

## 2020-05-16 DIAGNOSIS — J90 Pleural effusion, not elsewhere classified: Secondary | ICD-10-CM | POA: Diagnosis not present

## 2020-05-16 DIAGNOSIS — Z8673 Personal history of transient ischemic attack (TIA), and cerebral infarction without residual deficits: Secondary | ICD-10-CM

## 2020-05-16 DIAGNOSIS — I249 Acute ischemic heart disease, unspecified: Secondary | ICD-10-CM

## 2020-05-16 DIAGNOSIS — Z09 Encounter for follow-up examination after completed treatment for conditions other than malignant neoplasm: Secondary | ICD-10-CM

## 2020-05-16 DIAGNOSIS — R9389 Abnormal findings on diagnostic imaging of other specified body structures: Secondary | ICD-10-CM | POA: Diagnosis not present

## 2020-05-16 DIAGNOSIS — J4520 Mild intermittent asthma, uncomplicated: Secondary | ICD-10-CM | POA: Diagnosis not present

## 2020-05-16 DIAGNOSIS — E538 Deficiency of other specified B group vitamins: Secondary | ICD-10-CM | POA: Diagnosis not present

## 2020-05-16 DIAGNOSIS — R0602 Shortness of breath: Secondary | ICD-10-CM | POA: Diagnosis not present

## 2020-05-16 DIAGNOSIS — I340 Nonrheumatic mitral (valve) insufficiency: Secondary | ICD-10-CM | POA: Diagnosis not present

## 2020-05-16 DIAGNOSIS — Z01810 Encounter for preprocedural cardiovascular examination: Secondary | ICD-10-CM | POA: Diagnosis not present

## 2020-05-16 DIAGNOSIS — Z85828 Personal history of other malignant neoplasm of skin: Secondary | ICD-10-CM

## 2020-05-16 DIAGNOSIS — Z823 Family history of stroke: Secondary | ICD-10-CM

## 2020-05-16 DIAGNOSIS — J309 Allergic rhinitis, unspecified: Secondary | ICD-10-CM | POA: Diagnosis present

## 2020-05-16 DIAGNOSIS — Z825 Family history of asthma and other chronic lower respiratory diseases: Secondary | ICD-10-CM

## 2020-05-16 LAB — CBC
HCT: 37.8 % — ABNORMAL LOW (ref 39.0–52.0)
Hemoglobin: 12.3 g/dL — ABNORMAL LOW (ref 13.0–17.0)
MCH: 29.1 pg (ref 26.0–34.0)
MCHC: 32.5 g/dL (ref 30.0–36.0)
MCV: 89.6 fL (ref 80.0–100.0)
Platelets: 170 10*3/uL (ref 150–400)
RBC: 4.22 MIL/uL (ref 4.22–5.81)
RDW: 13.5 % (ref 11.5–15.5)
WBC: 5.4 10*3/uL (ref 4.0–10.5)
nRBC: 0 % (ref 0.0–0.2)

## 2020-05-16 LAB — COMPREHENSIVE METABOLIC PANEL
ALT: 26 U/L (ref 0–44)
AST: 35 U/L (ref 15–41)
Albumin: 3.7 g/dL (ref 3.5–5.0)
Alkaline Phosphatase: 50 U/L (ref 38–126)
Anion gap: 16 — ABNORMAL HIGH (ref 5–15)
BUN: 14 mg/dL (ref 8–23)
CO2: 18 mmol/L — ABNORMAL LOW (ref 22–32)
Calcium: 8.7 mg/dL — ABNORMAL LOW (ref 8.9–10.3)
Chloride: 103 mmol/L (ref 98–111)
Creatinine, Ser: 1.25 mg/dL — ABNORMAL HIGH (ref 0.61–1.24)
GFR calc Af Amer: >60 mL/min (ref >60)
GFR calc non Af Amer: 56 mL/min — ABNORMAL LOW (ref >60)
Glucose, Bld: 115 mg/dL — ABNORMAL HIGH (ref 70–99)
Potassium: 4 mmol/L (ref 3.5–5.1)
Sodium: 137 mmol/L (ref 135–145)
Total Bilirubin: 1.9 mg/dL — ABNORMAL HIGH (ref 0.3–1.2)
Total Protein: 6.9 g/dL (ref 6.5–8.1)

## 2020-05-16 LAB — TROPONIN I (HIGH SENSITIVITY)
Troponin I (High Sensitivity): 1681 ng/L (ref <18)
Troponin I (High Sensitivity): 4034 ng/L (ref <18)
Troponin I (High Sensitivity): 8044 ng/L (ref <18)

## 2020-05-16 LAB — RESPIRATORY PANEL BY RT PCR (FLU A&B, COVID)
Influenza A by PCR: NEGATIVE
Influenza B by PCR: NEGATIVE
SARS Coronavirus 2 by RT PCR: NEGATIVE

## 2020-05-16 LAB — TSH: TSH: 1.825 u[IU]/mL (ref 0.350–4.500)

## 2020-05-16 LAB — BRAIN NATRIURETIC PEPTIDE: B Natriuretic Peptide: 415.7 pg/mL — ABNORMAL HIGH (ref 0.0–100.0)

## 2020-05-16 MED ORDER — MONTELUKAST SODIUM 10 MG PO TABS
10.0000 mg | ORAL_TABLET | Freq: Every day | ORAL | Status: DC
  Administered 2020-05-17: 10 mg via ORAL
  Filled 2020-05-16: qty 1

## 2020-05-16 MED ORDER — FUROSEMIDE 10 MG/ML IJ SOLN: 20.0000 mg | Freq: Once | INTRAMUSCULAR | Status: DC

## 2020-05-16 MED ORDER — FUROSEMIDE 10 MG/ML IJ SOLN
20.0000 mg | Freq: Once | INTRAMUSCULAR | Status: AC
  Administered 2020-05-16: 20 mg via INTRAVENOUS
  Filled 2020-05-16: qty 2

## 2020-05-16 MED ORDER — MORPHINE SULFATE (PF) 4 MG/ML IV SOLN
4.0000 mg | Freq: Once | INTRAVENOUS | Status: AC
  Administered 2020-05-17: 4 mg via INTRAVENOUS
  Filled 2020-05-16: qty 1

## 2020-05-16 MED ORDER — SODIUM CHLORIDE 0.9% FLUSH
3.0000 mL | Freq: Two times a day (BID) | INTRAVENOUS | Status: DC
  Administered 2020-05-16 – 2020-05-17 (×2): 3 mL via INTRAVENOUS

## 2020-05-16 MED ORDER — DILTIAZEM LOAD VIA INFUSION
20.0000 mg | Freq: Once | INTRAVENOUS | Status: AC
  Administered 2020-05-16: 20 mg via INTRAVENOUS
  Filled 2020-05-16: qty 20

## 2020-05-16 MED ORDER — ONDANSETRON HCL 4 MG/2ML IJ SOLN: 4.0000 mg | Freq: Four times a day (QID) | INTRAMUSCULAR | Status: DC | PRN

## 2020-05-16 MED ORDER — NITROGLYCERIN 0.4 MG SL SUBL: 0.4000 mg | SUBLINGUAL_TABLET | SUBLINGUAL | Status: DC | PRN

## 2020-05-16 MED ORDER — IOHEXOL 350 MG/ML SOLN
75.0000 mL | Freq: Once | INTRAVENOUS | Status: AC | PRN
  Administered 2020-05-16: 75 mL via INTRAVENOUS

## 2020-05-16 MED ORDER — DILTIAZEM HCL-DEXTROSE 125-5 MG/125ML-% IV SOLN (PREMIX)
5.0000 mg/h | INTRAVENOUS | Status: DC
  Administered 2020-05-16: 5 mg/h via INTRAVENOUS
  Administered 2020-05-17: 15 mg/h via INTRAVENOUS
  Administered 2020-05-17: 10 mg/h via INTRAVENOUS
  Filled 2020-05-16 (×4): qty 125

## 2020-05-16 MED ORDER — HEPARIN (PORCINE) 25000 UT/250ML-% IV SOLN
1600.0000 [IU]/h | INTRAVENOUS | Status: DC
  Administered 2020-05-16: 1400 [IU]/h via INTRAVENOUS
  Filled 2020-05-16: qty 250

## 2020-05-16 MED ORDER — HEPARIN BOLUS VIA INFUSION
4000.0000 [IU] | Freq: Once | INTRAVENOUS | Status: AC
  Administered 2020-05-16: 4000 [IU] via INTRAVENOUS
  Filled 2020-05-16: qty 4000

## 2020-05-16 MED ORDER — FUROSEMIDE 10 MG/ML IJ SOLN
40.0000 mg | Freq: Once | INTRAMUSCULAR | Status: AC
  Administered 2020-05-17: 40 mg via INTRAVENOUS
  Filled 2020-05-16: qty 4

## 2020-05-16 MED ORDER — FINASTERIDE 5 MG PO TABS
5.0000 mg | ORAL_TABLET | Freq: Every day | ORAL | Status: DC
  Administered 2020-05-16: 5 mg via ORAL
  Filled 2020-05-16: qty 1

## 2020-05-16 MED ORDER — ASPIRIN EC 81 MG PO TBEC: 81.0000 mg | DELAYED_RELEASE_TABLET | Freq: Every day | ORAL | Status: DC

## 2020-05-16 MED ORDER — VITAMIN D 25 MCG (1000 UNIT) PO TABS
2000.0000 [IU] | ORAL_TABLET | Freq: Every day | ORAL | Status: DC
  Administered 2020-05-16: 2000 [IU] via ORAL
  Filled 2020-05-16: qty 2

## 2020-05-16 MED ORDER — LATANOPROST 0.005 % OP SOLN
1.0000 [drp] | Freq: Every day | OPHTHALMIC | Status: DC
  Administered 2020-05-18 – 2020-05-28 (×11): 1 [drp] via OPHTHALMIC
  Filled 2020-05-16 (×3): qty 2.5

## 2020-05-16 MED ORDER — ASPIRIN 81 MG PO CHEW
324.0000 mg | CHEWABLE_TABLET | Freq: Once | ORAL | Status: DC
  Filled 2020-05-16: qty 4

## 2020-05-16 MED ORDER — ACETAMINOPHEN 325 MG PO TABS: 650.0000 mg | ORAL_TABLET | ORAL | Status: DC | PRN

## 2020-05-16 MED ORDER — ALFUZOSIN HCL ER 10 MG PO TB24
10.0000 mg | ORAL_TABLET | Freq: Every day | ORAL | Status: DC
  Filled 2020-05-16: qty 1

## 2020-05-16 NOTE — ED Triage Notes (Signed)
BIB [facility] w/ complaints of SOB and chest pain starting today. Pt on 6 L simple mask put on by EMS w/ sats in the 100 %. Pt sinus tach on monitor per EMS w/ rate of 130s.

## 2020-05-16 NOTE — ED Provider Notes (Addendum)
[facility] EMERGENCY DEPARTMENT Provider Note   CSN: [id number] Arrival date & time: 05/16/20  1105     History Chief Complaint  Patient presents with  . Shortness of Breath  . Chest Pain    Eric Stokes is a 74 y.o. male.  Patient presents via EMS, c/o irregular heartbeat on and off in past few weeks, along with increased sob, chest congestion, and chest tightness in the past few days. Symptoms acute onset, episodic, persistent, worse in past couple days. States feels he has had irregular heartbeat at times for 'long while', but denies prior diagnosis of afib or other dysrhythmia. Denies exertional cp or discomfort. No pleuritic pain. No leg pain or swelling. Occasional non prod cough. No sore throat. No fever or chills. Denies leg pain or swelling.   The history is provided by the patient and the EMS personnel.  Shortness of Breath Associated symptoms: chest pain and cough   Associated symptoms: no abdominal pain, no fever, no headaches, no neck pain, no rash, no sore throat and no vomiting   Chest Pain Associated symptoms: cough, palpitations and shortness of breath   Associated symptoms: no abdominal pain, no back pain, no fever, no headache and no vomiting        Past Medical History:  Diagnosis Date  . Aortic stenosis, mild    noted on ECHO  . Asthma   . Benign essential hypertension, on Norvasc and Amlodipine 12/21/2008   Followed by [facility]   . BPH (benign prostatic hyperplasia) 09/03/2012  . Carotid bruit present 06/15/2018   2015 Carotid Dopplers:  Essentially normal carotid arteries, with very slight hard plaque in the proximal ICA's and serpentine distal vessels. 1-39% bilateral ICA stenosis. Patent vertebral arteries with antegrade flow. Normal subclavian arteries, bilaterally.  . Colonic polyp 01/09/2020  . COPD (chronic obstructive pulmonary disease) ([place])   . Dyslipidemia 12/21/2008   Qualifier: Diagnosis of  By: Percival Spanish, MD,  Farrel Gordon    . Essential hypertension, benign 12/21/2008   Qualifier: Diagnosis of  By: Percival Spanish, MD, Farrel Gordon    . Former smoker, stopped smoking in distant past 03/15/2018  . Hematuria 09/16/2011  . History of migraine   . OA (osteoarthritis)   . Obese   . OSA on CPAP 09/25/2015   Diagnosed at the [facility] 2016   . Prostate cancer ([place]) 11/30/2019  . PVC's (premature ventricular contractions)   . Seasonal allergies   . Sinus bradycardia   . Squamous cell carcinoma of skin   . TIA (transient ischemic attack)    questionable  . Vitamin B 12 deficiency   . Vitamin D deficiency 11/26/2010    Patient Active Problem List   Diagnosis Date Noted  . Osteoarthritis of right knee 03/14/2020  . Colonic polyp 01/09/2020  . Prostate cancer ([place]) 11/30/2019  . Lymphadenopathy 11/30/2019  . Obesity (BMI 30.0-34.9) 04/08/2019  . TIA (transient ischemic attack) 02/28/2019  . At risk for bradycardia 06/15/2018  . B12 deficiency, with monthly injections 06/15/2018  . Primary osteoarthritis of right knee, followed by Orthopedics 04/12/2018  . Asthma, allergic, prn albuterol, triggered by perfumes 03/19/2018  . OSA on CPAP 09/25/2015  . Hyperplasia of prostate with lower urinary tract symptoms (LUTS) 09/03/2012  . Vitamin D deficiency 11/26/2010  . Allergic rhinitis, using Flonase 11/20/2010  . Hearing loss 11/20/2010  . Dyslipidemia, on Pravastatin 12/21/2008  . Benign essential hypertension 12/21/2008    Past Surgical History:  Procedure Laterality Date  .  CHOLECYSTECTOMY  2001  . COLONOSCOPY    . KNEE ARTHROPLASTY Right 03/14/2020   Procedure: COMPUTER ASSISTED TOTAL KNEE ARTHROPLASTY;  Surgeon: Rod Can, MD;  Location: [facility] ORS;  Service: Orthopedics;  Laterality: Right;  . TONSILLECTOMY AND ADENOIDECTOMY  1952       Family History  Problem Relation Age of Onset  . Diabetes Mother   . COPD Father   . Stroke Paternal Grandfather     Social History   Tobacco Use   . Smoking status: Former Smoker    Types: Cigarettes    Quit date: 08/18/1965    Years since quitting: 54.7  . Smokeless tobacco: Never Used  . Tobacco comment: pt quit smoking 53 yrs ago  Vaping Use  . Vaping Use: Never used  Substance Use Topics  . Alcohol use: Yes    Alcohol/week: 2.0 - 6.0 standard drinks    Types: 2 - 6 Cans of beer per week  . Drug use: No    Home Medications Prior to Admission medications   Medication Sig Start Date End Date Taking? Authorizing Provider  albuterol (VENTOLIN HFA) 108 (90 Base) MCG/ACT inhaler Inhale 2 puffs into the lungs every 6 (six) hours as needed for wheezing or shortness of breath. 05/04/20   Leamon Arnt, MD  alfuzosin (UROXATRAL) 10 MG 24 hr tablet Take 5 mg by mouth in the morning and at bedtime.    [provider]  apixaban (ELIQUIS) 2.5 MG TABS tablet Take 1 tablet (2.5 mg total) by mouth 2 (two) times daily. 03/15/20 04/14/20  Cherlynn June B, PA  carboxymethylcellulose (REFRESH PLUS) 0.5 % SOLN Place 1 drop into both eyes 2 (two) times daily as needed (dry eyes).    [provider]  cetirizine (ZYRTEC) 10 MG tablet Take 10 mg by mouth daily. 01/16/13   [provider]  Cholecalciferol (VITAMIN D3) 2000 UNITS TABS Take 2,000 Int'l Units by mouth daily.     [provider]  finasteride (PROSCAR) 5 MG tablet Take 5 mg by mouth daily. 12/02/19   [provider]  fluticasone (FLONASE) 50 MCG/ACT nasal spray USE TWO SPRAYS IN EACH NOSTRIL DAILY AS NEEDED. 05/02/20   Leamon Arnt, MD  HYDROcodone-acetaminophen (NORCO/VICODIN) 5-325 MG tablet Take 1 tablet by mouth every 4 (four) hours as needed for moderate pain (pain score 4-6). 03/15/20   Cherlynn June B, PA  ibuprofen (ADVIL) 400 MG tablet Take 400 mg by mouth every 6 (six) hours as needed for moderate pain.     [provider]  latanoprost (XALATAN) 0.005 % ophthalmic solution INSTILL 1 DROP INTO BOTH EYES AT BEDTIME 05/02/20   Leamon Arnt, MD  losartan (COZAAR) 25 MG tablet TAKE 1 TABLET BY MOUTH EVERY DAY 03/01/20   Leamon Arnt, MD  montelukast (SINGULAIR) 10 MG tablet TAKE 1 TABLET BY MOUTH EVERY DAY AT BEDTIME 03/01/20   Leamon Arnt, MD  NON FORMULARY Cpap at night    [provider]  ondansetron (ZOFRAN) 4 MG tablet Take 1 tablet (4 mg total) by mouth every 6 (six) hours as needed for nausea. 03/15/20   Dorothyann Peng, PA  pravastatin (PRAVACHOL) 20 MG tablet Take 20 mg by mouth daily. 03/05/20   [provider]    Allergies    Tamsulosin, Cipro [ciprofloxacin hcl], Lipitor [atorvastatin], and Lisinopril  Review of Systems   Review of Systems  Constitutional: Negative for fever.  HENT: Negative for sore throat.   Eyes: Negative  for redness.  Respiratory: Positive for cough and shortness of breath.   Cardiovascular: Positive for chest pain and palpitations. Negative for leg swelling.  Gastrointestinal: Negative for abdominal pain, diarrhea and vomiting.  Genitourinary: Negative for flank pain.  Musculoskeletal: Negative for back pain and neck pain.  Skin: Negative for rash.  Neurological: Negative for headaches.  Hematological: Does not bruise/bleed easily.  Psychiatric/Behavioral: Negative for confusion.    Physical Exam Updated Vital Signs BP (!) 128/103 (BP Location: Right Arm)   Pulse (!) 135   Temp (!) 97.4 F (36.3 C) (Oral)   Resp (!) 23   SpO2 100%   Physical Exam Vitals and nursing note reviewed.  Constitutional:      Appearance: Normal appearance. He is well-developed.  HENT:     Head: Atraumatic.     Nose: Nose normal.     Mouth/Throat:     Mouth: Mucous membranes are moist.     Pharynx: Oropharynx is clear.  Eyes:     General: No scleral icterus.    Conjunctiva/sclera: Conjunctivae normal.  Neck:     Trachea: No tracheal deviation.  Cardiovascular:     Rate and Rhythm: Tachycardia present. Rhythm irregular.     Pulses: Normal pulses.     Heart  sounds: Normal heart sounds. No murmur heard.  No friction rub. No gallop.   Pulmonary:     Effort: Pulmonary effort is normal. No accessory muscle usage or respiratory distress.     Breath sounds: Normal breath sounds.  Chest:     Chest wall: No tenderness.  Abdominal:     General: Bowel sounds are normal. There is no distension.     Palpations: Abdomen is soft.     Tenderness: There is no abdominal tenderness. There is no guarding.  Genitourinary:    Comments: No cva tenderness. Musculoskeletal:        General: No tenderness.     Cervical back: Normal range of motion and neck supple. No rigidity.     Comments: Mild symmetric bil foot/ankle/lower leg edema.   Skin:    General: Skin is warm and dry.     Findings: No rash.  Neurological:     Mental Status: He is alert.     Comments: Alert, speech clear.   Psychiatric:        Mood and Affect: Mood normal.     ED Results / Procedures / Treatments   Labs (all labs ordered are listed, but only abnormal results are displayed) Results for orders placed or performed during the hospital encounter of 05/16/20  Respiratory Panel by RT PCR (Flu A&B, Covid) - Nasopharyngeal Swab   Specimen: Nasopharyngeal Swab  Result Value Ref Range   SARS Coronavirus 2 by RT PCR NEGATIVE NEGATIVE   Influenza A by PCR NEGATIVE NEGATIVE   Influenza B by PCR NEGATIVE NEGATIVE  Comprehensive metabolic panel  Result Value Ref Range   Sodium 137 135 - 145 mmol/L   Potassium 4.0 3.5 - 5.1 mmol/L   Chloride 103 98 - 111 mmol/L   CO2 18 (L) 22 - 32 mmol/L   Glucose, Bld 115 (H) 70 - 99 mg/dL   BUN 14 8 - 23 mg/dL   Creatinine, Ser 1.25 (H) 0.61 - 1.24 mg/dL   Calcium 8.7 (L) 8.9 - 10.3 mg/dL   Total Protein 6.9 6.5 - 8.1 g/dL   Albumin 3.7 3.5 - 5.0 g/dL   AST 35 15 - 41 U/L   ALT 26 0 - 44  U/L   Alkaline Phosphatase 50 38 - 126 U/L   Total Bilirubin 1.9 (H) 0.3 - 1.2 mg/dL   GFR calc non Af Amer 56 (L) >60 mL/min   GFR calc Af Amer >60 >60 mL/min    Anion gap 16 (H) 5 - 15  CBC  Result Value Ref Range   WBC 5.4 4.0 - 10.5 K/uL   RBC 4.22 4.22 - 5.81 MIL/uL   Hemoglobin 12.3 (L) 13.0 - 17.0 g/dL   HCT 37.8 (L) 39 - 52 %   MCV 89.6 80.0 - 100.0 fL   MCH 29.1 26.0 - 34.0 pg   MCHC 32.5 30.0 - 36.0 g/dL   RDW 13.5 11.5 - 15.5 %   Platelets 170 150 - 400 K/uL   nRBC 0.0 0.0 - 0.2 %  Brain natriuretic peptide  Result Value Ref Range   B Natriuretic Peptide 415.7 (H) 0.0 - 100.0 pg/mL  Troponin I (High Sensitivity)  Result Value Ref Range   Troponin I (High Sensitivity) 1,681 (HH) <18 ng/L    EKG EKG Interpretation  Date/Time:  Wednesday May 16 2020 11:43:12 EDT Ventricular Rate:  140 PR Interval:    QRS Duration: 117 QT Interval:  317 QTC Calculation: 484 R Axis:   -51 Text Interpretation: Atrial fibrillation RBBB and LAFB `St dep v3-v6, I Confirmed by Lajean Saver [phone]) on 05/16/2020 1:52:59 PM   Radiology DG Chest Port 1 View  Result Date: 05/16/2020 CLINICAL DATA:  Chest pain and shortness of breath today EXAM: PORTABLE CHEST 1 VIEW COMPARISON:  None. FINDINGS: Cardiomegaly and diffuse interstitial prominence with cephalized blood flow. Trace pleural fluid is possible on the right. Negative aortic contours for technique. IMPRESSION: CHF pattern. Electronically Signed   By: Monte Fantasia M.D.   On: 05/16/2020 11:58    Procedures Procedures (including critical care time)  Medications Ordered in ED Medications  diltiazem (CARDIZEM) 1 mg/mL load via infusion 20 mg (has no administration in time range)    And  diltiazem (CARDIZEM) 125 mg in dextrose 5% 125 mL (1 mg/mL) infusion (has no administration in time range)    ED Course  I have reviewed the triage vital signs and the nursing notes.  Pertinent labs & imaging results that were available during my care of the patient were reviewed by me and considered in my medical decision making (see chart for details).    MDM Rules/Calculators/A&P                           Iv ns. Continuous pulse ox and monitor. Stat labs, ecg. Cxr.   Patient in rapid afib, new onset, hr 150s. cardizem bolus/gtt.  Chewable asa po.   MDM Number of Diagnoses or Management Options   Amount and/or Complexity of Data Reviewed Clinical lab tests: ordered and reviewed Tests in the radiology section of CPT: ordered and reviewed Tests in the medicine section of CPT: ordered and reviewed Discussion of test results with the performing providers: yes Decide to obtain previous medical records or to obtain history from someone other than the patient: yes Obtain history from someone other than the patient: yes Review and summarize past medical records: yes Discuss the patient with other providers: yes Independent visualization of images, tracings, or specimens: yes  Risk of Complications, Morbidity, and/or Mortality Presenting problems: high Diagnostic procedures: high Management options: high   Reviewed nursing notes and prior charts for additional history.   Patient in rapid afib -  cardizem bolus and gtt. Given symptoms present for past several weeks, and not currently on anticoag therapy, pt is not candidate for ED cardioversion.   Labs reviewed/interpreted by me - initial trop very high, 1681  CXR reviewed/interpreted by me - vascular congestion/chf. Lasix iv.   On recheck, hr improved on cardizem gtt, hr 80s. Pt notes resolution of earlier palpitations, and resolution of chest pain/pressure. His troponin is markedly elevated, more than typically associated with rate/strain issue - suspect ACS. Repeat ecg. Cp remains resolved. Repeat ecg pending.  Cardiology consulted - discussed pt, recent chest pain/pressure, new rapid afib, abnormal ecg w st dep I, II, V3-v6 and trop 1681 - they will see in ED.    Heparin per pharmacy consult.   CRITICAL CARE RE: new onset rapid afib, acute coronary syndrome/MI Performed by: Mirna Mires Total critical care time: 80  minutes Critical care time was exclusive of separately billable procedures and treating other patients. Critical care was necessary to treat or prevent imminent or life-threatening deterioration. Critical care was time spent personally by me on the following activities: development of treatment plan with patient and/or surrogate as well as nursing, discussions with consultants, evaluation of patient's response to treatment, examination of patient, obtaining history from patient or surrogate, ordering and performing treatments and interventions, ordering and review of laboratory studies, ordering and review of radiographic studies, pulse oximetry and re-evaluation of patient's condition.  CHADSVASC score = 2.   Final Clinical Impression(s) / ED Diagnoses Final diagnoses:  None    Rx / DC Orders ED Discharge Orders    None          Lajean Saver, MD 05/16/20 [phone]

## 2020-05-16 NOTE — ED Notes (Addendum)
Dr. Vanita Panda in to see pt. Dr. Vanita Panda to place orders until cardiology places orders.

## 2020-05-16 NOTE — Progress Notes (Addendum)
ANTICOAGULATION CONSULT NOTE - Initial Consult  Pharmacy Consult for IV heparin Indication: chest pain/ACS and atrial fibrillation  Allergies  Allergen Reactions  . Tamsulosin Other (See Comments)    dizziness  . Cipro [Ciprofloxacin Hcl] Swelling    Knee swelling, joint pain  . Lipitor [Atorvastatin] Other (See Comments)    Leg Cramps  . Lisinopril Cough    Patient Measurements: Height: 5\' 10"  (177.8 cm) Weight: 108.9 kg (240 lb) IBW/kg (Calculated) : 73 Heparin Dosing Weight: 96.5kg  Vital Signs: Temp: 97.4 F (36.3 C) (09/29 1114) Temp Source: Oral (09/29 1114) BP: 108/73 (09/29 1330) Pulse Rate: 107 (09/29 1330)  Labs: Recent Labs    05/16/20 1150  HGB 12.3*  HCT 37.8*  PLT 170  CREATININE 1.25*  TROPONINIHS 1,681*    Estimated Creatinine Clearance: 64.1 mL/min (A) (by C-G formula based on SCr of 1.25 mg/dL (H)).   Medical History: Past Medical History:  Diagnosis Date  . Aortic stenosis, mild    noted on ECHO  . Asthma   . Benign essential hypertension, on Norvasc and Amlodipine 12/21/2008   Followed by [facility]   . BPH (benign prostatic hyperplasia) 09/03/2012  . Carotid bruit present 06/15/2018   2015 Carotid Dopplers:  Essentially normal carotid arteries, with very slight hard plaque in the proximal ICA's and serpentine distal vessels. 1-39% bilateral ICA stenosis. Patent vertebral arteries with antegrade flow. Normal subclavian arteries, bilaterally.  . Colonic polyp 01/09/2020  . COPD (chronic obstructive pulmonary disease) ([place])   . Dyslipidemia 12/21/2008   Qualifier: Diagnosis of  By: Percival Spanish, MD, Farrel Gordon    . Essential hypertension, benign 12/21/2008   Qualifier: Diagnosis of  By: Percival Spanish, MD, Farrel Gordon    . Former smoker, stopped smoking in distant past 03/15/2018  . Hematuria 09/16/2011  . History of migraine   . OA (osteoarthritis)   . Obese   . OSA on CPAP 09/25/2015   Diagnosed at the [facility] 2016   . Prostate cancer  ([place]) 11/30/2019  . PVC's (premature ventricular contractions)   . Seasonal allergies   . Sinus bradycardia   . Squamous cell carcinoma of skin   . TIA (transient ischemic attack)    questionable  . Vitamin B 12 deficiency   . Vitamin D deficiency 11/26/2010    Medications:  Scheduled:  . aspirin  324 mg Oral Once    Assessment: 73yoM presenting with complaints of intermittent irregular heartbeat accompanied by chest pain for the past few weeks. Troponins are elevated ~1700. EKG shows afib with RVR.  CBC WNL. Not on [facility] PTA - record shows apixaban ordered 03/15/20 (VTE ppx per ortho) but confirmed he did not pick up at the pharmacy.    Pharmacy to start IV heparin for ACS/afib.   Goal of Therapy:  Heparin level 0.3-0.7 units/ml Monitor platelets by anticoagulation protocol: Yes   Plan:  Give heparin 4,000 units bolus x 1 Start heparin infusion at 1,400 units/hr Check anti-Xa level in 8 hours and daily while on heparin Continue to monitor H&H and platelets Follow up with cardiology workup, plans for oral [place], PharmD PGY1 Acute Care Pharmacy Resident Please refer to [facility] for unit-specific pharmacist

## 2020-05-16 NOTE — ED Notes (Signed)
Patient returned from CT

## 2020-05-16 NOTE — ED Notes (Signed)
Called CT to discuss stat CTA and drips.

## 2020-05-16 NOTE — ED Triage Notes (Signed)
Pt took 324 mg ASA at home.

## 2020-05-16 NOTE — ED Notes (Signed)
In to speak with pt. Pt c/o increase sob and chest discomfort. Repeat EKG done and given to ED MD. Will compare to previous EKGs. Admit MD paged.

## 2020-05-16 NOTE — H&P (Signed)
Cardiology Admission History and Physical:   Patient ID: DANNA CASELLA MRN: [id number]; DOB: 1945-11-12   Admission date: 05/16/2020  Primary Care Provider: Leamon Arnt, MD [facility] Cardiologist: Dr. Burt Knack  Chief Complaint: Palpitations, shortness of breath and chest discomfort  Patient Profile:   Eric Stokes is a 74 y.o. male with history of sinus bradycardia, PVC, hypertension, prostate cancer, BPH, obstructive sleep apnea on CPAP and hyperlipidemia presented for progressive worsening 1 month history of dyspnea, chest discomfort and palpitations and found to be in atrial fibrillation with rapid ventricular rate.  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.  Exercise tolerance test in 2015 at [facility] was negative for ischemia.  Establish care with [facility] in 2018 for sinus bradycardia.  Seen by Ellen Henri, PA along with Dr. Burt Knack.  Follow-up ETT was normal without ischemia. Had appropriate increase in heart rate.  Follow-up cardiac monitor showed sinus rhythm with episode of bradycardia without prolonged pauses.  The patient has been followed up by Ellen Henri, last seen 04/2018 >> advised PRN follow up.   Seen by me for surgical clearance of right knee surgery June 2021.  He was very active and cleared without additional work-up.  He tolerated surgery well.   Patient is diagnosed with prostate cancer earlier this year.  Currently taking finasteride to shrink the size. Have upcoming evaluation in  November.   History of Present Illness:   Mr. No presented with 1 month history of progressive worsening intermittent chest discomfort, dyspnea and palpitations.  He used to do work that he can not do now because of shortness of breath and some discomfort on his chest.  He has noted exertional limitation.  No dizziness, syncope or melena.  Has right lower extremity edema since his surgery.  Denies orthopnea or PND.  Patient did  heavy exertional activity yesterday and was very tired and needed to stop every 5 minutes.  He was resting 20 minutes at a time in between.  EMS was called due to worsening chest discomfort and shortness of breath this morning and found to be in sinus tachycardic.  Upon arrival to emergency room, he was noted in atrial fibrillation with rapid ventricular rate.  He was started on IV Cardizem 5 mg/h.  Heart rate now improved to 100s.  On heparin for anticoagulation.  Covid and influenza panel negative.  He has been vaccinated.  Chest x-ray suggestive of CHF.  BNP 415.  High-sensitivity troponin 1681.  Creatinine 1.25.  Cardiology is asked for further evaluation.  Past Medical History:  Diagnosis Date  . Aortic stenosis, mild    noted on ECHO  . Asthma   . Benign essential hypertension, on Norvasc and Amlodipine 12/21/2008   Followed by [facility]   . BPH (benign prostatic hyperplasia) 09/03/2012  . Carotid bruit present 06/15/2018   2015 Carotid Dopplers:  Essentially normal carotid arteries, with very slight hard plaque in the proximal ICA's and serpentine distal vessels. 1-39% bilateral ICA stenosis. Patent vertebral arteries with antegrade flow. Normal subclavian arteries, bilaterally.  . Colonic polyp 01/09/2020  . COPD (chronic obstructive pulmonary disease) ([place])   . Dyslipidemia 12/21/2008   Qualifier: Diagnosis of  By: Percival Spanish, MD, Farrel Gordon    . Essential hypertension, benign 12/21/2008   Qualifier: Diagnosis of  By: Percival Spanish, MD, Farrel Gordon    . Former smoker, stopped smoking in distant past 03/15/2018  . Hematuria 09/16/2011  . History of migraine   . OA (  osteoarthritis)   . Obese   . OSA on CPAP 09/25/2015   Diagnosed at the [facility] 2016   . Prostate cancer ([place]) 11/30/2019  . PVC's (premature ventricular contractions)   . Seasonal allergies   . Sinus bradycardia   . Squamous cell carcinoma of skin   . TIA (transient ischemic attack)    questionable  . Vitamin B  12 deficiency   . Vitamin D deficiency 11/26/2010    Past Surgical History:  Procedure Laterality Date  . CHOLECYSTECTOMY  2001  . COLONOSCOPY    . KNEE ARTHROPLASTY Right 03/14/2020   Procedure: COMPUTER ASSISTED TOTAL KNEE ARTHROPLASTY;  Surgeon: Rod Can, MD;  Location: [facility];  Service: Orthopedics;  Laterality: Right;  . TONSILLECTOMY AND ADENOIDECTOMY  1952     Medications Prior to Admission: Prior to Admission medications   Medication Sig Start Date End Date Taking? Authorizing Provider  albuterol (VENTOLIN HFA) 108 (90 Base) MCG/ACT inhaler Inhale 2 puffs into the lungs every 6 (six) hours as needed for wheezing or shortness of breath. 05/04/20   Leamon Arnt, MD  alfuzosin (UROXATRAL) 10 MG 24 hr tablet Take 5 mg by mouth in the morning and at bedtime.    [provider]  apixaban (ELIQUIS) 2.5 MG TABS tablet Take 1 tablet (2.5 mg total) by mouth 2 (two) times daily. 03/15/20 04/14/20  Cherlynn June B, PA  carboxymethylcellulose (REFRESH PLUS) 0.5 % SOLN Place 1 drop into both eyes 2 (two) times daily as needed (dry eyes).    [provider]  cetirizine (ZYRTEC) 10 MG tablet Take 10 mg by mouth daily. 01/16/13   [provider]  Cholecalciferol (VITAMIN D3) 2000 UNITS TABS Take 2,000 Int'l Units by mouth daily.     [provider]  finasteride (PROSCAR) 5 MG tablet Take 5 mg by mouth daily. 12/02/19   [provider]  fluticasone (FLONASE) 50 MCG/ACT nasal spray USE TWO SPRAYS IN EACH NOSTRIL DAILY AS NEEDED. 05/02/20   Leamon Arnt, MD  HYDROcodone-acetaminophen (NORCO/VICODIN) 5-325 MG tablet Take 1 tablet by mouth every 4 (four) hours as needed for moderate pain (pain score 4-6). 03/15/20   Cherlynn June B, PA  ibuprofen (ADVIL) 400 MG tablet Take 400 mg by mouth every 6 (six) hours as needed for moderate pain.     [provider]  latanoprost (XALATAN) 0.005 % ophthalmic solution INSTILL 1 DROP INTO BOTH EYES AT  BEDTIME 05/02/20   Leamon Arnt, MD  losartan (COZAAR) 25 MG tablet TAKE 1 TABLET BY MOUTH EVERY DAY 03/01/20   Leamon Arnt, MD  montelukast (SINGULAIR) 10 MG tablet TAKE 1 TABLET BY MOUTH EVERY DAY AT BEDTIME 03/01/20   Leamon Arnt, MD  NON FORMULARY Cpap at night    [provider]  ondansetron (ZOFRAN) 4 MG tablet Take 1 tablet (4 mg total) by mouth every 6 (six) hours as needed for nausea. 03/15/20   Dorothyann Peng, PA  pravastatin (PRAVACHOL) 20 MG tablet Take 20 mg by mouth daily. 03/05/20   [provider]     Allergies:    Allergies  Allergen Reactions  . Tamsulosin Other (See Comments)    dizziness  . Cipro [Ciprofloxacin Hcl] Swelling    Knee swelling, joint pain  . Lipitor [Atorvastatin] Other (See Comments)    Leg Cramps  . Lisinopril Cough    Social History:   Social History   Socioeconomic History  . Marital status: Married  Spouse name: Not on file  . Number of children: Not on file  . Years of education: Not on file  . Highest education level: Not on file  Occupational History  . Occupation: retired  Tobacco Use  . Smoking status: Former Smoker    Types: Cigarettes    Quit date: 08/18/1965    Years since quitting: 54.7  . Smokeless tobacco: Never Used  . Tobacco comment: pt quit smoking 53 yrs ago  Vaping Use  . Vaping Use: Never used  Substance and Sexual Activity  . Alcohol use: Yes    Alcohol/week: 2.0 - 6.0 standard drinks    Types: 2 - 6 Cans of beer per week  . Drug use: No  . Sexual activity: Yes  Other Topics Concern  . Not on file  Social History Narrative  . Not on file   Social Determinants of Health   Financial Resource Strain: Low Risk   . Difficulty of Paying Living Expenses: Not hard at all  Food Insecurity: No Food Insecurity  . Worried About Charity fundraiser in the Last Year: Never true  . Ran Out of Food in the Last Year: Never true  Transportation Needs: No Transportation Needs  . Lack of  Transportation (Medical): No  . Lack of Transportation (Non-Medical): No  Physical Activity: Unknown  . Days of Exercise per Week: 7 days  . Minutes of Exercise per Session: Not on file  Stress: No Stress Concern Present  . Feeling of Stress : Not at all  Social Connections: Moderately Isolated  . Frequency of Communication with Friends and Family: More than three times a week  . Frequency of Social Gatherings with Friends and Family: More than three times a week  . Attends Religious Services: Never  . Active Member of Clubs or Organizations: No  . Attends Archivist Meetings: Never  . Marital Status: Married  Human resources officer Violence: Not At Risk  . Fear of Current or Ex-Partner: No  . Emotionally Abused: No  . Physically Abused: No  . Sexually Abused: No    Family History:   The patient's family history includes COPD in his father; Diabetes in his mother; Stroke in his paternal grandfather.    ROS:  Please see the history of present illness.  All other ROS reviewed and negative.     Physical Exam/Data:   Vitals:   05/16/20 1215 05/16/20 1300 05/16/20 1315 05/16/20 1330  BP: 109/81 114/77 119/69 108/73  Pulse: (!) 52 (!) 51 92 (!) 107  Resp: (!) 25 (!) 22 (!) 23 (!) 24  Temp:      TempSrc:      SpO2: 97% 96% 97% 95%  Weight:      Height:       No intake or output data in the 24 hours ending 05/16/20 1442 Last 3 Weights 05/16/2020 03/14/2020 02/27/2020  Weight (lbs) 240 lb 248 lb 252 lb  Weight (kg) 108.863 kg 112.492 kg 114.306 kg     Body mass index is 34.44 kg/m.  General:  Well nourished, well developed, in no acute distress HEENT: normal Lymph: no adenopathy Neck: no JVD Endocrine:  No thryomegaly Vascular: No carotid bruits; FA pulses 2+ bilaterally without bruits  Cardiac:  normal S1, S2; irregularly irregular tachycardia; no murmur  Lungs: Diminished breath sound with scattered rails Abd: soft, nontender, no hepatomegaly  Ext: 1+ right lower  extremity edema and trace left lower extremity edema Musculoskeletal:  No deformities, BUE and  BLE strength normal and equal Skin: warm and dry  Neuro:  CNs 2-12 intact, no focal abnormalities noted Psych:  Normal affect    EKG:  The ECG that was done today was personally reviewed and demonstrates atrial fibrillation at rate of 147 bpm, ST depression laterally  Relevant CV Studies: Echo 02/2019 1. The left ventricle has normal systolic function with an ejection  fraction of 60-65%. The cavity size was normal. There is moderately  increased left ventricular wall thickness. Left ventricular diastolic  parameters were normal. No evidence of left  ventricular regional wall motion abnormalities.  2. The right ventricle has normal systolic function. The cavity was  normal. There is no increase in right ventricular wall thickness.  3. The mitral valve is abnormal. Mild thickening of the mitral valve  leaflet. There is mild mitral annular calcification present.  4. The tricuspid valve is grossly normal.  5. The aortic valve is abnormal. Moderate calcification of the aortic  valve. Mild stenosis of the aortic valve.   Laboratory Data:  High Sensitivity Troponin:   Recent Labs  Lab 05/16/20 1150  TROPONINIHS 1,681*      Chemistry Recent Labs  Lab 05/16/20 1150  NA 137  K 4.0  CL 103  CO2 18*  GLUCOSE 115*  BUN 14  CREATININE 1.25*  CALCIUM 8.7*  GFRNONAA 56*  GFRAA >60  ANIONGAP 16*    Recent Labs  Lab 05/16/20 1150  PROT 6.9  ALBUMIN 3.7  AST 35  ALT 26  ALKPHOS 50  BILITOT 1.9*   Hematology Recent Labs  Lab 05/16/20 1150  WBC 5.4  RBC 4.22  HGB 12.3*  HCT 37.8*  MCV 89.6  MCH 29.1  MCHC 32.5  RDW 13.5  PLT 170   BNP Recent Labs  Lab 05/16/20 1155  BNP 415.7*    Radiology/Studies:  DG Chest Port 1 View  Result Date: 05/16/2020 CLINICAL DATA:  Chest pain and shortness of breath today EXAM: PORTABLE CHEST 1 VIEW COMPARISON:  None. FINDINGS:  Cardiomegaly and diffuse interstitial prominence with cephalized blood flow. Trace pleural fluid is possible on the right. Negative aortic contours for technique. IMPRESSION: CHF pattern. Electronically Signed   By: Monte Fantasia M.D.   On: 05/16/2020 11:58    TIMI Risk Score for Unstable Angina or Non-ST Elevation MI:   The patient's TIMI risk score is 4, which indicates a 20% risk of all cause mortality, new or recurrent myocardial infarction or need for urgent revascularization in the next 14 days.   New York Heart Association (NYHA) Functional Class NYHA Class I  Assessment and Plan:   1. New onset atrial fibrillation with rapid ventricular rate -Symptoms symptoms onset started 1 month ago.  Progressively worsened.  He is symptomatic with this.  Heart rate improved on IV Cardizem 5 mg/h.  On heparin currently for anticoagulation. CHA2DS2-VASc Score = 2  The patient's score is based upon: CHF History: 0 HTN History: 1 Age : 1 Diabetes History: 0 Stroke History: 0 Vascular Disease History: 0 Gender: 0  Score of 3 if CHF  2.  Elevated troponin - Patient reported exertional limitation for past 1 month.  He has dyspnea on exertion with chest discomfort.  He may have underlying heart disease.  EKG with ST depression during rapid ventricular rate.  High-sensitivity troponin 1681.  Continue to follow -On heparin for anticoagulation -Get echocardiogram - R and L cath tomorrow  3.  Possible CHF exacerbation -BNP 415.  Chest x-ray consistent with  CHF. -Could be due to pulmonary edema secondary to elevated heart rate -Given IV Lasix 20 mg x 1 in emergency room. -Wait echocardiogram  4.  Hypertension -Blood pressure soft low -Continue IV Cardizem -Hold home losartan  5.  Prostate cancer -Currently on Proscar 5 mg daily to shrink size -Has upcoming evaluation in November for reassessment  6. OSA on CPAP - Compliant   Severity of Illness: The appropriate patient status for  this patient is INPATIENT. Inpatient status is judged to be reasonable and necessary in order to provide the required intensity of service to ensure the patient's safety. The patient's presenting symptoms, physical exam findings, and initial radiographic and laboratory data in the context of their chronic comorbidities is felt to place them at high risk for further clinical deterioration. Furthermore, it is not anticipated that the patient will be medically stable for discharge from the hospital within 2 midnights of admission. The following factors support the patient status of inpatient.   " The patient's presenting symptoms include  SOB, chest discomfort, palpitations. " The worrisome physical exam findings include Elevated HR, LE edema " The initial radiographic and laboratory data are worrisome because of Elevated troponin and BNP " The chronic co-morbidities include HTN, OSA   * I certify that at the point of admission it is my clinical judgment that the patient will require inpatient hospital care spanning beyond 2 midnights from the point of admission due to high intensity of service, high risk for further deterioration and high frequency of surveillance required.*    For questions or updates, please contact [place] Please consult [email or web address] for contact info under     Jarrett Soho, PA  05/16/2020 2:42 PM

## 2020-05-16 NOTE — ED Notes (Signed)
Spoke with MD from Cardiology. Will place orders for lasix and morphine.

## 2020-05-16 NOTE — ED Notes (Signed)
Called pharm for Card drip

## 2020-05-17 ENCOUNTER — Encounter (HOSPITAL_COMMUNITY)
Admission: EM | Disposition: A | Discharge status: Home Health | Payer: Self-pay | Source: Home / Self Care | Attending: Cardiothoracic Surgery

## 2020-05-17 ENCOUNTER — Other Ambulatory Visit: Payer: Self-pay | Admitting: *Deleted

## 2020-05-17 ENCOUNTER — Inpatient Hospital Stay (HOSPITAL_COMMUNITY): Payer: No Typology Code available for payment source

## 2020-05-17 ENCOUNTER — Ambulatory Visit (HOSPITAL_COMMUNITY): Admit: 2020-05-17 | Payer: No Typology Code available for payment source | Admitting: Internal Medicine

## 2020-05-17 DIAGNOSIS — R570 Cardiogenic shock: Secondary | ICD-10-CM

## 2020-05-17 DIAGNOSIS — I340 Nonrheumatic mitral (valve) insufficiency: Secondary | ICD-10-CM

## 2020-05-17 DIAGNOSIS — I252 Old myocardial infarction: Secondary | ICD-10-CM

## 2020-05-17 DIAGNOSIS — I25110 Atherosclerotic heart disease of native coronary artery with unstable angina pectoris: Secondary | ICD-10-CM

## 2020-05-17 DIAGNOSIS — I4891 Unspecified atrial fibrillation: Secondary | ICD-10-CM

## 2020-05-17 DIAGNOSIS — I350 Nonrheumatic aortic (valve) stenosis: Secondary | ICD-10-CM

## 2020-05-17 DIAGNOSIS — Z01810 Encounter for preprocedural cardiovascular examination: Secondary | ICD-10-CM

## 2020-05-17 DIAGNOSIS — I2510 Atherosclerotic heart disease of native coronary artery without angina pectoris: Secondary | ICD-10-CM

## 2020-05-17 DIAGNOSIS — I214 Non-ST elevation (NSTEMI) myocardial infarction: Secondary | ICD-10-CM | POA: Diagnosis present

## 2020-05-17 DIAGNOSIS — I361 Nonrheumatic tricuspid (valve) insufficiency: Secondary | ICD-10-CM

## 2020-05-17 DIAGNOSIS — I5043 Acute on chronic combined systolic (congestive) and diastolic (congestive) heart failure: Secondary | ICD-10-CM

## 2020-05-17 HISTORY — PX: RIGHT HEART CATH: CATH118263

## 2020-05-17 HISTORY — PX: RIGHT/LEFT HEART CATH AND CORONARY ANGIOGRAPHY: CATH118266

## 2020-05-17 HISTORY — DX: Old myocardial infarction: I25.2

## 2020-05-17 HISTORY — PX: IABP INSERTION: CATH118242

## 2020-05-17 LAB — BASIC METABOLIC PANEL
Anion gap: 11 (ref 5–15)
BUN: 15 mg/dL (ref 8–23)
CO2: 26 mmol/L (ref 22–32)
Calcium: 8.3 mg/dL — ABNORMAL LOW (ref 8.9–10.3)
Chloride: 100 mmol/L (ref 98–111)
Creatinine, Ser: 1.07 mg/dL (ref 0.61–1.24)
GFR calc Af Amer: >60 mL/min (ref >60)
GFR calc non Af Amer: >60 mL/min (ref >60)
Glucose, Bld: 131 mg/dL — ABNORMAL HIGH (ref 70–99)
Potassium: 3.3 mmol/L — ABNORMAL LOW (ref 3.5–5.1)
Sodium: 137 mmol/L (ref 135–145)

## 2020-05-17 LAB — POCT I-STAT EG7
Acid-Base Excess: 0 mmol/L (ref 0.0–2.0)
Acid-base deficit: 1 mmol/L (ref 0.0–2.0)
Acid-base deficit: 1 mmol/L (ref 0.0–2.0)
Acid-base deficit: 1 mmol/L (ref 0.0–2.0)
Bicarbonate: 24 mmol/L (ref 20.0–28.0)
Bicarbonate: 24.1 mmol/L (ref 20.0–28.0)
Bicarbonate: 24 mmol/L (ref 20.0–28.0)
Bicarbonate: 25.8 mmol/L (ref 20.0–28.0)
Calcium, Ion: 1.13 mmol/L — ABNORMAL LOW (ref 1.15–1.40)
Calcium, Ion: 1.15 mmol/L (ref 1.15–1.40)
Calcium, Ion: 1.01 mmol/L — ABNORMAL LOW (ref 1.15–1.40)
Calcium, Ion: 1.16 mmol/L (ref 1.15–1.40)
HCT: 36 % — ABNORMAL LOW (ref 39.0–52.0)
HCT: 36 % — ABNORMAL LOW (ref 39.0–52.0)
HCT: 34 % — ABNORMAL LOW (ref 39.0–52.0)
HCT: 36 % — ABNORMAL LOW (ref 39.0–52.0)
Hemoglobin: 12.2 g/dL — ABNORMAL LOW (ref 13.0–17.0)
Hemoglobin: 12.2 g/dL — ABNORMAL LOW (ref 13.0–17.0)
Hemoglobin: 11.6 g/dL — ABNORMAL LOW (ref 13.0–17.0)
Hemoglobin: 12.2 g/dL — ABNORMAL LOW (ref 13.0–17.0)
O2 Saturation: 62 %
O2 Saturation: 62 %
O2 Saturation: 51 %
O2 Saturation: 54 %
Potassium: 3.5 mmol/L (ref 3.5–5.1)
Potassium: 3.5 mmol/L (ref 3.5–5.1)
Potassium: 3.4 mmol/L — ABNORMAL LOW (ref 3.5–5.1)
Potassium: 3.8 mmol/L (ref 3.5–5.1)
Sodium: 139 mmol/L (ref 135–145)
Sodium: 139 mmol/L (ref 135–145)
Sodium: 138 mmol/L (ref 135–145)
Sodium: 142 mmol/L (ref 135–145)
TCO2: 25 mmol/L (ref 22–32)
TCO2: 25 mmol/L (ref 22–32)
TCO2: 25 mmol/L (ref 22–32)
TCO2: 27 mmol/L (ref 22–32)
pCO2, Ven: 41.1 mmHg — ABNORMAL LOW (ref 44.0–60.0)
pCO2, Ven: 41.3 mmHg — ABNORMAL LOW (ref 44.0–60.0)
pCO2, Ven: 40.2 mmHg — ABNORMAL LOW (ref 44.0–60.0)
pCO2, Ven: 43.7 mmHg — ABNORMAL LOW (ref 44.0–60.0)
pH, Ven: 7.371 (ref 7.250–7.430)
pH, Ven: 7.376 (ref 7.250–7.430)
pH, Ven: 7.379 (ref 7.250–7.430)
pH, Ven: 7.384 (ref 7.250–7.430)
pO2, Ven: 33 mmHg (ref 32.0–45.0)
pO2, Ven: 33 mmHg (ref 32.0–45.0)
pO2, Ven: 28 mmHg — CL (ref 32.0–45.0)
pO2, Ven: 29 mmHg — CL (ref 32.0–45.0)

## 2020-05-17 LAB — CBC
HCT: 36.8 % — ABNORMAL LOW (ref 39.0–52.0)
Hemoglobin: 12.2 g/dL — ABNORMAL LOW (ref 13.0–17.0)
MCH: 29.3 pg (ref 26.0–34.0)
MCHC: 33.2 g/dL (ref 30.0–36.0)
MCV: 88.5 fL (ref 80.0–100.0)
Platelets: 183 10*3/uL (ref 150–400)
RBC: 4.16 MIL/uL — ABNORMAL LOW (ref 4.22–5.81)
RDW: 13.5 % (ref 11.5–15.5)
WBC: 6.6 10*3/uL (ref 4.0–10.5)
nRBC: 0 % (ref 0.0–0.2)

## 2020-05-17 LAB — POCT I-STAT 7, (LYTES, BLD GAS, ICA,H+H)
Acid-base deficit: 2 mmol/L (ref 0.0–2.0)
Bicarbonate: 22.4 mmol/L (ref 20.0–28.0)
Calcium, Ion: 1.15 mmol/L (ref 1.15–1.40)
HCT: 35 % — ABNORMAL LOW (ref 39.0–52.0)
Hemoglobin: 11.9 g/dL — ABNORMAL LOW (ref 13.0–17.0)
O2 Saturation: 98 %
Potassium: 3.6 mmol/L (ref 3.5–5.1)
Sodium: 138 mmol/L (ref 135–145)
TCO2: 23 mmol/L (ref 22–32)
pCO2 arterial: 35.2 mmHg (ref 32.0–48.0)
pH, Arterial: 7.412 (ref 7.350–7.450)
pO2, Arterial: 105 mmHg (ref 83.0–108.0)

## 2020-05-17 LAB — HEMOGLOBIN A1C
Hgb A1c MFr Bld: 5.3 % (ref 4.8–5.6)
Mean Plasma Glucose: 105.41 mg/dL

## 2020-05-17 LAB — ECHOCARDIOGRAM COMPLETE
AR max vel: 1.34 cm2
AV Area VTI: 1.35 cm2
AV Area mean vel: 1.14 cm2
AV Mean grad: 5.5 mmHg
AV Peak grad: 10.1 mmHg
Ao pk vel: 1.59 m/s
Area-P 1/2: 4.3 cm2
Calc EF: 28.1 %
Height: 70 in
MV M vel: 3.84 m/s
MV Peak grad: 59 mmHg
Single Plane A2C EF: 35.6 %
Single Plane A4C EF: 21.1 %
Weight: 3840 oz

## 2020-05-17 LAB — COOXEMETRY PANEL
Carboxyhemoglobin: 1.1 % (ref 0.5–1.5)
Methemoglobin: 0.8 % (ref 0.0–1.5)
O2 Saturation: 60 %
Total hemoglobin: 11.3 g/dL — ABNORMAL LOW (ref 12.0–16.0)

## 2020-05-17 LAB — LIPID PANEL
Cholesterol: 123 mg/dL (ref 0–200)
HDL: 32 mg/dL — ABNORMAL LOW (ref >40)
Total CHOL/HDL Ratio: 3.8 RATIO
Triglycerides: 71 mg/dL (ref <150)
VLDL: 14 mg/dL (ref 0–40)

## 2020-05-17 LAB — SURGICAL PCR SCREEN
MRSA, PCR: NEGATIVE
Staphylococcus aureus: NEGATIVE

## 2020-05-17 LAB — PROTIME-INR
INR: 1.3 — ABNORMAL HIGH (ref 0.8–1.2)
Prothrombin Time: 15.4 seconds — ABNORMAL HIGH (ref 11.4–15.2)

## 2020-05-17 LAB — HEPARIN LEVEL (UNFRACTIONATED): Heparin Unfractionated: 0.24 IU/mL — ABNORMAL LOW (ref 0.30–0.70)

## 2020-05-17 LAB — TROPONIN I (HIGH SENSITIVITY): Troponin I (High Sensitivity): 6860 ng/L (ref <18)

## 2020-05-17 LAB — GLUCOSE, CAPILLARY: Glucose-Capillary: 108 mg/dL — ABNORMAL HIGH (ref 70–99)

## 2020-05-17 SURGERY — RIGHT/LEFT HEART CATH AND CORONARY ANGIOGRAPHY
Anesthesia: LOCAL

## 2020-05-17 SURGERY — IABP INSERTION
Anesthesia: LOCAL

## 2020-05-17 MED ORDER — SODIUM CHLORIDE 0.9 % IV SOLN: INTRAVENOUS | Status: AC

## 2020-05-17 MED ORDER — SODIUM CHLORIDE 0.9% FLUSH: 3.0000 mL | INTRAVENOUS | Status: DC | PRN

## 2020-05-17 MED ORDER — TRANEXAMIC ACID 1000 MG/10ML IV SOLN
1.5000 mg/kg/h | INTRAVENOUS | Status: DC
  Filled 2020-05-17: qty 25

## 2020-05-17 MED ORDER — SODIUM CHLORIDE 0.9 % IV SOLN
1.5000 g | INTRAVENOUS | Status: DC
  Filled 2020-05-17: qty 1.5

## 2020-05-17 MED ORDER — HEPARIN (PORCINE) IN NACL 1000-0.9 UT/500ML-% IV SOLN
INTRAVENOUS | Status: DC | PRN
  Administered 2020-05-17: 500 mL

## 2020-05-17 MED ORDER — AMIODARONE HCL IN DEXTROSE 360-4.14 MG/200ML-% IV SOLN
30.0000 mg/h | INTRAVENOUS | Status: DC
  Administered 2020-05-18 – 2020-05-27 (×14): 30 mg/h via INTRAVENOUS
  Filled 2020-05-17 (×18): qty 200

## 2020-05-17 MED ORDER — IOHEXOL 350 MG/ML SOLN
INTRAVENOUS | Status: DC | PRN
  Administered 2020-05-17: 10 mL

## 2020-05-17 MED ORDER — CHLORHEXIDINE GLUCONATE CLOTH 2 % EX PADS: 6.0000 | MEDICATED_PAD | Freq: Once | CUTANEOUS | Status: DC

## 2020-05-17 MED ORDER — CHLORHEXIDINE GLUCONATE 0.12 % MT SOLN: 15.0000 mL | Freq: Once | OROMUCOSAL | Status: DC

## 2020-05-17 MED ORDER — SODIUM CHLORIDE 0.9 % WEIGHT BASED INFUSION
1.0000 mL/kg/h | INTRAVENOUS | Status: DC
  Administered 2020-05-17: 1 mL/kg/h via INTRAVENOUS

## 2020-05-17 MED ORDER — METOPROLOL TARTRATE 12.5 MG HALF TABLET: 12.5000 mg | ORAL_TABLET | Freq: Once | ORAL | Status: DC

## 2020-05-17 MED ORDER — AMIODARONE HCL IN DEXTROSE 360-4.14 MG/200ML-% IV SOLN: 60.0000 mg/h | INTRAVENOUS | Status: DC

## 2020-05-17 MED ORDER — VANCOMYCIN HCL 1500 MG/300ML IV SOLN
1500.0000 mg | INTRAVENOUS | Status: DC
  Filled 2020-05-17: qty 300

## 2020-05-17 MED ORDER — ASPIRIN 81 MG PO CHEW
81.0000 mg | CHEWABLE_TABLET | ORAL | Status: AC
  Administered 2020-05-17: 81 mg via ORAL
  Filled 2020-05-17: qty 1

## 2020-05-17 MED ORDER — NOREPINEPHRINE 4 MG/250ML-% IV SOLN
0.0000 ug/min | INTRAVENOUS | Status: DC
  Filled 2020-05-17: qty 250

## 2020-05-17 MED ORDER — ASPIRIN 81 MG PO CHEW: 81.0000 mg | CHEWABLE_TABLET | Freq: Every day | ORAL | Status: DC

## 2020-05-17 MED ORDER — MILRINONE LACTATE IN DEXTROSE 20-5 MG/100ML-% IV SOLN
0.3000 ug/kg/min | INTRAVENOUS | Status: DC
  Filled 2020-05-17: qty 100

## 2020-05-17 MED ORDER — FENTANYL CITRATE (PF) 100 MCG/2ML IJ SOLN
INTRAMUSCULAR | Status: AC
  Filled 2020-05-17: qty 2

## 2020-05-17 MED ORDER — PERFLUTREN LIPID MICROSPHERE
1.0000 mL | INTRAVENOUS | Status: AC | PRN
  Administered 2020-05-17: 2 mL via INTRAVENOUS
  Filled 2020-05-17: qty 10

## 2020-05-17 MED ORDER — DEXMEDETOMIDINE HCL IN NACL 400 MCG/100ML IV SOLN
0.1000 ug/kg/h | INTRAVENOUS | Status: DC
  Filled 2020-05-17: qty 100

## 2020-05-17 MED ORDER — SODIUM CHLORIDE 0.9 % IV SOLN
750.0000 mg | INTRAVENOUS | Status: DC
  Filled 2020-05-17: qty 750

## 2020-05-17 MED ORDER — HEPARIN (PORCINE) IN NACL 1000-0.9 UT/500ML-% IV SOLN
INTRAVENOUS | Status: AC
  Filled 2020-05-17: qty 1000

## 2020-05-17 MED ORDER — SODIUM CHLORIDE 0.9% FLUSH: 3.0000 mL | Freq: Two times a day (BID) | INTRAVENOUS | Status: DC

## 2020-05-17 MED ORDER — VERAPAMIL HCL 2.5 MG/ML IV SOLN
INTRA_ARTERIAL | Status: DC | PRN
  Administered 2020-05-17: 7 mL via INTRA_ARTERIAL

## 2020-05-17 MED ORDER — TEMAZEPAM 15 MG PO CAPS: 15.0000 mg | ORAL_CAPSULE | Freq: Once | ORAL | Status: DC | PRN

## 2020-05-17 MED ORDER — ASPIRIN 81 MG PO CHEW: 81.0000 mg | CHEWABLE_TABLET | ORAL | Status: DC

## 2020-05-17 MED ORDER — HEPARIN (PORCINE) 25000 UT/250ML-% IV SOLN
1000.0000 [IU]/h | INTRAVENOUS | Status: DC
  Administered 2020-05-17: 1000 [IU]/h via INTRAVENOUS
  Filled 2020-05-17: qty 250

## 2020-05-17 MED ORDER — MILRINONE LACTATE IN DEXTROSE 20-5 MG/100ML-% IV SOLN: 0.2500 ug/kg/min | INTRAVENOUS | Status: DC

## 2020-05-17 MED ORDER — POTASSIUM CHLORIDE CRYS ER 10 MEQ PO TBCR
40.0000 meq | EXTENDED_RELEASE_TABLET | Freq: Every day | ORAL | Status: DC
  Filled 2020-05-17 (×2): qty 4

## 2020-05-17 MED ORDER — FUROSEMIDE 10 MG/ML IJ SOLN
INTRAMUSCULAR | Status: AC
  Filled 2020-05-17: qty 4

## 2020-05-17 MED ORDER — SODIUM CHLORIDE 0.9 % IV SOLN: 250.0000 mL | INTRAVENOUS | Status: DC | PRN

## 2020-05-17 MED ORDER — FENTANYL CITRATE (PF) 100 MCG/2ML IJ SOLN
INTRAMUSCULAR | Status: DC | PRN
Start: 2020-05-17 — End: 2020-05-17
  Administered 2020-05-17 (×4): 25 ug via INTRAVENOUS

## 2020-05-17 MED ORDER — SODIUM CHLORIDE 0.9 % WEIGHT BASED INFUSION: 3.0000 mL/kg/h | INTRAVENOUS | Status: DC

## 2020-05-17 MED ORDER — FUROSEMIDE 10 MG/ML IJ SOLN
80.0000 mg | Freq: Once | INTRAMUSCULAR | Status: AC
  Administered 2020-05-17: 80 mg via INTRAVENOUS
  Filled 2020-05-17: qty 8

## 2020-05-17 MED ORDER — POTASSIUM CHLORIDE 2 MEQ/ML IV SOLN
80.0000 meq | INTRAVENOUS | Status: DC
  Filled 2020-05-17: qty 40

## 2020-05-17 MED ORDER — HYDRALAZINE HCL 20 MG/ML IJ SOLN: 10.0000 mg | INTRAMUSCULAR | Status: AC | PRN

## 2020-05-17 MED ORDER — PLASMA-LYTE 148 IV SOLN
INTRAVENOUS | Status: DC
  Filled 2020-05-17: qty 2.5

## 2020-05-17 MED ORDER — AMIODARONE HCL IN DEXTROSE 360-4.14 MG/200ML-% IV SOLN
60.0000 mg/h | INTRAVENOUS | Status: DC
  Administered 2020-05-17: 60 mg/h via INTRAVENOUS
  Filled 2020-05-17: qty 200

## 2020-05-17 MED ORDER — FUROSEMIDE 10 MG/ML IJ SOLN
40.0000 mg | Freq: Once | INTRAMUSCULAR | Status: AC
  Administered 2020-05-17: 40 mg via INTRAVENOUS

## 2020-05-17 MED ORDER — MIDAZOLAM HCL 2 MG/2ML IJ SOLN
INTRAMUSCULAR | Status: AC
  Filled 2020-05-17: qty 2

## 2020-05-17 MED ORDER — LABETALOL HCL 5 MG/ML IV SOLN: 10.0000 mg | INTRAVENOUS | Status: AC | PRN

## 2020-05-17 MED ORDER — PHENYLEPHRINE HCL-NACL 20-0.9 MG/250ML-% IV SOLN
30.0000 ug/min | INTRAVENOUS | Status: DC
  Filled 2020-05-17: qty 250

## 2020-05-17 MED ORDER — LIDOCAINE HCL (PF) 1 % IJ SOLN
INTRAMUSCULAR | Status: AC
  Filled 2020-05-17: qty 30

## 2020-05-17 MED ORDER — MIDAZOLAM HCL 2 MG/2ML IJ SOLN
INTRAMUSCULAR | Status: DC | PRN
  Administered 2020-05-17: 0.5 mg via INTRAVENOUS

## 2020-05-17 MED ORDER — HEPARIN (PORCINE) IN NACL 1000-0.9 UT/500ML-% IV SOLN
INTRAVENOUS | Status: DC | PRN
  Administered 2020-05-17 (×2): 500 mL

## 2020-05-17 MED ORDER — SODIUM CHLORIDE 0.9 % IV SOLN
INTRAVENOUS | Status: DC
  Filled 2020-05-17: qty 30

## 2020-05-17 MED ORDER — MIDAZOLAM HCL 2 MG/2ML IJ SOLN
INTRAMUSCULAR | Status: DC | PRN
  Administered 2020-05-17: 1 mg via INTRAVENOUS

## 2020-05-17 MED ORDER — TEMAZEPAM 7.5 MG PO CAPS
7.5000 mg | ORAL_CAPSULE | Freq: Every evening | ORAL | Status: DC | PRN
  Administered 2020-05-17: 7.5 mg via ORAL
  Filled 2020-05-17: qty 1

## 2020-05-17 MED ORDER — MORPHINE SULFATE (PF) 2 MG/ML IV SOLN
2.0000 mg | INTRAVENOUS | Status: DC | PRN
  Administered 2020-05-17 (×2): 2 mg via INTRAVENOUS
  Filled 2020-05-17 (×3): qty 1

## 2020-05-17 MED ORDER — MUPIROCIN 2 % EX OINT
1.0000 "application " | TOPICAL_OINTMENT | Freq: Two times a day (BID) | CUTANEOUS | Status: DC
  Administered 2020-05-17 – 2020-05-21 (×6): 1 via NASAL
  Filled 2020-05-17 (×2): qty 22

## 2020-05-17 MED ORDER — MAGNESIUM SULFATE 50 % IJ SOLN
40.0000 meq | INTRAMUSCULAR | Status: DC
  Filled 2020-05-17: qty 9.85

## 2020-05-17 MED ORDER — HEPARIN SODIUM (PORCINE) 1000 UNIT/ML IJ SOLN
INTRAMUSCULAR | Status: AC
  Filled 2020-05-17: qty 1

## 2020-05-17 MED ORDER — IOHEXOL 350 MG/ML SOLN
INTRAVENOUS | Status: DC | PRN
  Administered 2020-05-17: 20 mL

## 2020-05-17 MED ORDER — SODIUM CHLORIDE 0.9 % WEIGHT BASED INFUSION: 1.0000 mL/kg/h | INTRAVENOUS | Status: DC

## 2020-05-17 MED ORDER — INSULIN REGULAR(HUMAN) IN NACL 100-0.9 UT/100ML-% IV SOLN
INTRAVENOUS | Status: DC
  Filled 2020-05-17: qty 100

## 2020-05-17 MED ORDER — BISACODYL 5 MG PO TBEC: 5.0000 mg | DELAYED_RELEASE_TABLET | Freq: Once | ORAL | Status: DC

## 2020-05-17 MED ORDER — SODIUM CHLORIDE 0.9% FLUSH: 10.0000 mL | Freq: Two times a day (BID) | INTRAVENOUS | Status: DC

## 2020-05-17 MED ORDER — TRANEXAMIC ACID (OHS) BOLUS VIA INFUSION
15.0000 mg/kg | INTRAVENOUS | Status: DC
  Filled 2020-05-17: qty 1634

## 2020-05-17 MED ORDER — LIDOCAINE HCL (PF) 1 % IJ SOLN
INTRAMUSCULAR | Status: DC | PRN
  Administered 2020-05-17: 15 mL
  Administered 2020-05-17: 10 mL

## 2020-05-17 MED ORDER — ASPIRIN EC 81 MG PO TBEC: 81.0000 mg | DELAYED_RELEASE_TABLET | Freq: Every day | ORAL | Status: DC

## 2020-05-17 MED ORDER — MILRINONE LACTATE IN DEXTROSE 20-5 MG/100ML-% IV SOLN
0.2500 ug/kg/min | INTRAVENOUS | Status: DC
  Administered 2020-05-17: 0.25 ug/kg/min via INTRAVENOUS
  Administered 2020-05-18: 0.375 ug/kg/min via INTRAVENOUS
  Filled 2020-05-17 (×3): qty 100

## 2020-05-17 MED ORDER — ALFUZOSIN HCL ER 10 MG PO TB24
10.0000 mg | ORAL_TABLET | Freq: Every day | ORAL | Status: DC
  Administered 2020-05-17: 10 mg via ORAL
  Filled 2020-05-17 (×3): qty 1

## 2020-05-17 MED ORDER — HEPARIN SODIUM (PORCINE) 1000 UNIT/ML IJ SOLN
INTRAMUSCULAR | Status: DC | PRN
  Administered 2020-05-17: 5000 [IU] via INTRAVENOUS

## 2020-05-17 MED ORDER — CHLORHEXIDINE GLUCONATE CLOTH 2 % EX PADS
6.0000 | MEDICATED_PAD | Freq: Every day | CUTANEOUS | Status: DC
  Administered 2020-05-17: 6 via TOPICAL

## 2020-05-17 MED ORDER — NITROGLYCERIN IN D5W 200-5 MCG/ML-% IV SOLN
2.0000 ug/min | INTRAVENOUS | Status: DC
  Filled 2020-05-17 (×2): qty 250

## 2020-05-17 MED ORDER — NITROGLYCERIN 1 MG/10 ML FOR IR/CATH LAB
INTRA_ARTERIAL | Status: AC
  Filled 2020-05-17: qty 10

## 2020-05-17 MED ORDER — SODIUM CHLORIDE 0.9% FLUSH: 10.0000 mL | INTRAVENOUS | Status: DC | PRN

## 2020-05-17 MED ORDER — LIDOCAINE HCL (PF) 1 % IJ SOLN
INTRAMUSCULAR | Status: DC | PRN
  Administered 2020-05-17 (×2): 2 mL

## 2020-05-17 MED ORDER — ACETAMINOPHEN 325 MG PO TABS
650.0000 mg | ORAL_TABLET | ORAL | Status: DC | PRN
  Administered 2020-05-17: 650 mg via ORAL
  Filled 2020-05-17: qty 2

## 2020-05-17 MED ORDER — CHLORHEXIDINE GLUCONATE CLOTH 2 % EX PADS: 6.0000 | MEDICATED_PAD | Freq: Every day | CUTANEOUS | Status: DC

## 2020-05-17 MED ORDER — ONDANSETRON HCL 4 MG/2ML IJ SOLN: 4.0000 mg | Freq: Four times a day (QID) | INTRAMUSCULAR | Status: DC | PRN

## 2020-05-17 MED ORDER — VERAPAMIL HCL 2.5 MG/ML IV SOLN
INTRAVENOUS | Status: AC
  Filled 2020-05-17: qty 2

## 2020-05-17 MED ORDER — TRANEXAMIC ACID (OHS) PUMP PRIME SOLUTION
2.0000 mg/kg | INTRAVENOUS | Status: DC
  Filled 2020-05-17: qty 2.18

## 2020-05-17 MED ORDER — AMIODARONE HCL IN DEXTROSE 360-4.14 MG/200ML-% IV SOLN
INTRAVENOUS | Status: AC
  Filled 2020-05-17: qty 200

## 2020-05-17 MED ORDER — EPINEPHRINE HCL 5 MG/250ML IV SOLN IN NS
0.0000 ug/min | INTRAVENOUS | Status: DC
  Filled 2020-05-17: qty 250

## 2020-05-17 MED ORDER — LEVALBUTEROL HCL 0.63 MG/3ML IN NEBU
0.6300 mg | INHALATION_SOLUTION | Freq: Four times a day (QID) | RESPIRATORY_TRACT | Status: DC | PRN
  Administered 2020-05-17: 0.63 mg via RESPIRATORY_TRACT
  Filled 2020-05-17 (×2): qty 3

## 2020-05-17 MED ORDER — FENTANYL CITRATE (PF) 100 MCG/2ML IJ SOLN
INTRAMUSCULAR | Status: DC | PRN
Start: 2020-05-17 — End: 2020-05-17
  Administered 2020-05-17: 25 ug via INTRAVENOUS

## 2020-05-17 MED ORDER — FUROSEMIDE 10 MG/ML IJ SOLN
INTRAMUSCULAR | Status: DC | PRN
  Administered 2020-05-17: 80 mg via INTRAVENOUS

## 2020-05-17 MED ORDER — HEPARIN (PORCINE) 25000 UT/250ML-% IV SOLN
1600.0000 [IU]/h | INTRAVENOUS | Status: DC
  Filled 2020-05-17: qty 250

## 2020-05-17 SURGICAL SUPPLY — 15 items
CATH BALLN WEDGE 5F 110CM (CATHETERS) ×1 IMPLANT
CATH INFINITI 5FR ANG PIGTAIL (CATHETERS) ×1 IMPLANT
CATH OPTITORQUE TIG 4.0 5F (CATHETERS) ×1 IMPLANT
DEVICE RAD COMP TR BAND LRG (VASCULAR PRODUCTS) ×1 IMPLANT
GLIDESHEATH SLEND A-KIT 6F 22G (SHEATH) ×2 IMPLANT
GUIDEWIRE .025 260CM (WIRE) ×1 IMPLANT
GUIDEWIRE INQWIRE 1.5J.035X260 (WIRE) IMPLANT
INQWIRE 1.5J .035X260CM (WIRE) ×2
KIT ESSENTIALS PG (KITS) ×1 IMPLANT
KIT HEART LEFT (KITS) ×2 IMPLANT
PACK CARDIAC CATHETERIZATION (CUSTOM PROCEDURE TRAY) ×2 IMPLANT
SHEATH GLIDE SLENDER 4/5FR (SHEATH) ×1 IMPLANT
TRANSDUCER W/STOPCOCK (MISCELLANEOUS) ×2 IMPLANT
TUBING CIL FLEX 10 FLL-RA (TUBING) ×2 IMPLANT
WIRE HI TORQ VERSACORE-J 145CM (WIRE) ×1 IMPLANT

## 2020-05-17 SURGICAL SUPPLY — 11 items
BALLN IABP SENSA PLUS 8F 50CC (BALLOONS) ×2
BALLOON IABP SENS PLUS 8F 50CC (BALLOONS) IMPLANT
CATH SWAN GANZ VIP 7.5F (CATHETERS) ×1 IMPLANT
PACK CARDIAC CATHETERIZATION (CUSTOM PROCEDURE TRAY) ×2 IMPLANT
SHEATH PINNACLE 6F 10CM (SHEATH) ×1 IMPLANT
SHEATH SET SUPER ARROWFLEX 9FR (SHEATH) ×1 IMPLANT
SLEEVE REPOSITIONING LENGTH 30 (MISCELLANEOUS) ×1 IMPLANT
TRANSDUCER W/STOPCOCK (MISCELLANEOUS) ×2 IMPLANT
TUBING ART PRESS 72  MALE/FEM (TUBING) ×2
TUBING ART PRESS 72 MALE/FEM (TUBING) IMPLANT
WIRE EMERALD 3MM-J .035X150CM (WIRE) ×1 IMPLANT

## 2020-05-17 NOTE — Progress Notes (Signed)
[place] for IV heparin Indication: chest pain/ACS and atrial fibrillation  Allergies  Allergen Reactions  . Tamsulosin Other (See Comments)    dizziness  . Cipro [Ciprofloxacin Hcl] Swelling    Knee swelling, joint pain  . Lipitor [Atorvastatin] Other (See Comments)    Leg Cramps  . Lisinopril Cough    Patient Measurements: Height: 5\' 10"  (177.8 cm) Weight: 108.9 kg (240 lb) IBW/kg (Calculated) : 73 Heparin Dosing Weight: 96.5kg  Vital Signs: BP: 115/85 (09/29 2245) Pulse Rate: 86 (09/29 2245)  Labs: Recent Labs    05/16/20 1150 05/16/20 1530 05/16/20 1830 05/16/20 2300  HGB 12.3*  --   --   --   HCT 37.8*  --   --   --   PLT 170  --   --   --   HEPARINUNFRC  --   --   --  0.24*  CREATININE 1.25*  --   --   --   TROPONINIHS 1,681* 4,034* 8,044*  --     Estimated Creatinine Clearance: 64.1 mL/min (A) (by C-G formula based on SCr of 1.25 mg/dL (H)).   Assessment: 73yoM presenting with complaints of intermittent irregular heartbeat accompanied by chest pain for the past few weeks. Troponins are elevated ~1700. EKG shows afib with RVR.  CBC WNL. Not on [facility] PTA - record shows apixaban ordered 03/15/20 (VTE ppx per ortho) but confirmed he did not pick up at the pharmacy.    Pharmacy dosing IV heparin for ACS/afib. Heparin level subtherapeutic (0.24) on gtt at 1400 units/hr. RN states that line keeps beeping and having to be reset, no bleeding noted.  Goal of Therapy:  Heparin level 0.3-0.7 units/ml Monitor platelets by anticoagulation protocol: Yes   Plan:  Increase heparin infusion to 1600 units/hr Will f/u 8 hr heparin level  Sherlon Handing, PharmD, BCPS Please see amion for complete clinical pharmacist phone list 05/17/2020 12:10 AM

## 2020-05-17 NOTE — Progress Notes (Signed)
Daughter Cathy's phone number placed on clipboard as point of contact.   [phone]

## 2020-05-17 NOTE — Progress Notes (Signed)
[place] for IV heparin Indication: chest pain/ACS and atrial fibrillation + IABP  Allergies  Allergen Reactions  . Tamsulosin Other (See Comments)    dizziness  . Cipro [Ciprofloxacin Hcl] Swelling    Knee swelling, joint pain  . Lipitor [Atorvastatin] Other (See Comments)    Leg Cramps  . Lisinopril Cough    Patient Measurements: Height: 5\' 10"  (177.8 cm) Weight: 108.9 kg (240 lb) IBW/kg (Calculated) : 73 Heparin Dosing Weight: 96.5kg  Vital Signs: BP: 99/49 (09/30 1525) Pulse Rate: 87 (09/30 1525)  Labs: Recent Labs    05/16/20 1150 05/16/20 1150 05/16/20 1530 05/16/20 1830 05/16/20 2300 05/17/20 0500 05/17/20 0500 05/17/20 0537 05/17/20 0824 05/17/20 0824 05/17/20 0825 05/17/20 0829  HGB 12.3*   < >  --   --   --  12.2*   < >  --  12.2*   < > 12.2* 11.9*  HCT 37.8*   < >  --   --   --  36.8*   < >  --  36.0*  --  36.0* 35.0*  PLT 170  --   --   --   --  183  --   --   --   --   --   --   HEPARINUNFRC  --   --   --   --  0.24*  --   --   --   --   --   --   --   CREATININE 1.25*  --   --   --   --   --   --   --   --   --   --   --   TROPONINIHS 1,681*   < > 4,034* 8,044*  --   --   --  6,860*  --   --   --   --    < > = values in this interval not displayed.    Estimated Creatinine Clearance: 64.1 mL/min (A) (by C-G formula based on SCr of 1.25 mg/dL (H)).   Assessment: 73yoM presenting with complaints of intermittent irregular heartbeat accompanied by chest pain for the past few weeks. Troponins are elevated ~1700. EKG shows afib with RVR.  CBC WNL. Not on [facility] PTA - record shows apixaban ordered 03/15/20 (VTE ppx per ortho) but confirmed he did not pick up at the pharmacy.    Pt now s/p cath lab with IABP placed. Pt received several boluses within cath lab. CABG planned for tomorrow for mvCAD. Pharmacy asked to continue heparin overnight for IABP, will use lower dose given recent boluses.  Goal of Therapy:  Heparin  level 0.3-0.5 units/ml Monitor platelets by anticoagulation protocol: Yes   Plan:  Heparin 1000 units/h Daily heparin level CABG in am   Arrie Senate, PharmD, BCPS Clinical Pharmacist [phone] Please check AMION for all [place] numbers 05/17/2020

## 2020-05-17 NOTE — ED Notes (Signed)
Pt reports taking Eliquis 30 days post knee surgery and is unable to recall last date or time taken. Pt denies currently taking blood thinner.

## 2020-05-17 NOTE — Consult Note (Addendum)
Advanced Heart Failure Team Consult Note   Primary Physician: Leamon Arnt, MD Cardiologist:  Dr Burt Knack  Reason for Consultation: Acute Systolic Heart Failure   HPI:    Eric Stokes is seen today for evaluation of acute systolic heart failure at the request of Dr Orvan Seen.   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.  Eric Stokes is a 74 year old with history of   hx of sinus bradycardia, PVCs, HTN, OSA on CPAP, HLD and prostate CA (taking finasteride).  Had ECHO in 2020 that showed EF 60-6% with normal RV.   Presented to [facility] via EMS with chest pain and shortness of breath. Arrived in the ED was in A fib RVR and had ST depression. HS Trop [id number]. Started on Cardizem drip.  On heparin for anticoagulation.  Covid and influenza panel negative.    Cardiology consulted. Today he underwent cath today as noted below with severe multivessel disease.CT surgery consulted. Plan for CABG tomorrow. Prior to CABG, Dr Orvan Seen requested HF team consult for IABP and swan.  ECHO completed and showed severely reduced EF-->25-30%.   RHC/LHC 05/17/20   Ost LM to Mid LM lesion is 99% stenosed.  Ost RCA to Prox RCA lesion is 99% stenosed.  Prox RCA to Mid RCA lesion is 80% stenosed. RA 17 PA 40/11 PCWP 28  CO 5 CI 2.2  Echo 05/17/20  1. There is akinesis of the mid-to-distal anterolateral, all  mid-to-distal septal, mid-to-distal anterior, and all apical segments. The  rest of the LV segments are hypokinetic. Findings are suggestive of  multivessel CAD.Marland Kitchen Left ventricular ejection  fraction, by estimation, is 25 to 30%. The left ventricle has severely  decreased function. The left ventricle demonstrates regional wall motion  abnormalities (see scoring diagram/findings for description). There is  moderate concentric left ventricular  hypertrophy. Left ventricular diastolic parameters are indeterminate.  2. Right ventricular systolic function is normal. The right  ventricular  size is normal. There is mildly elevated pulmonary artery systolic  pressure.  3. Left atrial size was moderately dilated.  4. The mitral valve is degenerative. Mild mitral valve regurgitation. No  evidence of mitral stenosis.  5. The aortic valve is tricuspid. There is moderate calcification of the  aortic valve. There is moderate thickening of the aortic valve. Aortic  valve regurgitation is not visualized. Mild aortic valve stenosis.  6. The inferior vena cava is dilated in size with >50% respiratory  variability, suggesting right atrial pressure of 8 mmHg.  Review of Systems: [y] = yes, [ ]  = no   . General: Weight gain [ ] ; Weight loss [ ] ; Anorexia [ ] ; Fatigue [ Y]; Fever [ ] ; Chills [ ] ; Weakness [ ]   . Cardiac: Chest pain/pressure [Y ]; Resting SOB [ ] ; Exertional SOB [Y ]; Orthopnea [ ] ; Pedal Edema [ ] ; Palpitations [ ] ; Syncope [ ] ; Presyncope [ ] ; Paroxysmal nocturnal dyspnea[ ]   . Pulmonary: Cough [ ] ; Wheezing[ ] ; Hemoptysis[ ] ; Sputum [ ] ; Snoring [ ]   . GI: Vomiting[ ] ; Dysphagia[ ] ; Melena[ ] ; Hematochezia [ ] ; Heartburn[ ] ; Abdominal pain [ ] ; Constipation [ ] ; Diarrhea [ ] ; BRBPR [ ]   . GU: Hematuria[ ] ; Dysuria [ ] ; Nocturia[ ]   . Vascular: Pain in legs with walking [ ] ; Pain in feet with lying flat [ ] ; Non-healing sores [ ] ; Stroke [ ] ; TIA [ ] ; Slurred speech [ ] ;  . Neuro: Headaches[ ] ; Vertigo[ ] ; Seizures[ ] ;  Paresthesias[ ] ;Blurred vision [ ] ; Diplopia [ ] ; Vision changes [ ]   . Ortho/Skin: Arthritis [ ] ; Joint pain [ ] ; Muscle pain [ ] ; Joint swelling [ ] ; Back Pain [Y]; Rash [ ]   . Psych: Depression[ ] ; Anxiety[ ]   . Heme: Bleeding problems [ ] ; Clotting disorders [ ] ; Anemia [ ]   . Endocrine: Diabetes [ ] ; Thyroid dysfunction[ ]   Home Medications Prior to Admission medications   Medication Sig Start Date End Date Taking? Authorizing Provider  acetaminophen (TYLENOL) 500 MG tablet Take 1,000 mg by mouth at bedtime.   Yes [provider]  albuterol (VENTOLIN HFA) 108 (90 Base) MCG/ACT inhaler Inhale 2 puffs into the lungs every 6 (six) hours as needed for wheezing or shortness of breath. 05/04/20  Yes Leamon Arnt, MD  alfuzosin (UROXATRAL) 10 MG 24 hr tablet Take 5 mg by mouth in the morning and at bedtime.   Yes [provider]  carboxymethylcellulose (REFRESH PLUS) 0.5 % SOLN Place 1 drop into both eyes 2 (two) times daily as needed (dry eyes).   Yes [provider]  cetirizine (ZYRTEC) 10 MG tablet Take 10 mg by mouth daily. 01/16/13  Yes [provider]  Cholecalciferol (VITAMIN D3) 2000 UNITS TABS Take 2,000 Int'l Units by mouth daily.    Yes [provider]  finasteride (PROSCAR) 5 MG tablet Take 5 mg by mouth daily. 12/02/19  Yes [provider]  fluticasone (FLONASE) 50 MCG/ACT nasal spray USE TWO SPRAYS IN EACH NOSTRIL DAILY AS NEEDED. Patient taking differently: Place 2 sprays into both nostrils daily as needed for allergies.  05/02/20  Yes Leamon Arnt, MD  ibuprofen (ADVIL) 400 MG tablet Take 400 mg by mouth every 6 (six) hours as needed for moderate pain.    Yes [provider]  latanoprost (XALATAN) 0.005 % ophthalmic solution INSTILL 1 DROP INTO BOTH EYES AT BEDTIME Patient taking differently: Place 1 drop into both eyes daily.  05/02/20  Yes Leamon Arnt, MD  losartan (COZAAR) 25 MG tablet TAKE 1 TABLET BY MOUTH EVERY DAY Patient taking differently: Take 25 mg by mouth daily.  03/01/20  Yes Leamon Arnt, MD  montelukast (SINGULAIR) 10 MG tablet TAKE 1 TABLET BY MOUTH EVERY DAY AT BEDTIME Patient taking differently: Take 10 mg by mouth at bedtime.  03/01/20  Yes Leamon Arnt, MD  NON FORMULARY Cpap at night   Yes [provider]  pravastatin (PRAVACHOL) 20 MG tablet Take 20 mg by mouth at bedtime.  03/05/20  Yes [provider]  apixaban (ELIQUIS) 2.5 MG TABS tablet Take 1 tablet (2.5 mg total) by mouth 2 (two) times daily. 03/15/20  04/14/20  Dorothyann Peng, PA    Past Medical History: Past Medical History:  Diagnosis Date  . Aortic stenosis, mild    noted on ECHO  . Asthma   . Benign essential hypertension, on Norvasc and Amlodipine 12/21/2008   Followed by [facility]   . BPH (benign prostatic hyperplasia) 09/03/2012  . Carotid bruit present 06/15/2018   2015 Carotid Dopplers:  Essentially normal carotid arteries, with very slight hard plaque in the proximal ICA's and serpentine distal vessels. 1-39% bilateral ICA stenosis. Patent vertebral arteries with antegrade flow. Normal subclavian arteries, bilaterally.  . Colonic polyp 01/09/2020  . COPD (chronic obstructive pulmonary disease) ([place])   . Dyslipidemia 12/21/2008   Qualifier: Diagnosis of  By: Percival Spanish, MD, Farrel Gordon    . Essential hypertension, benign 12/21/2008  Qualifier: Diagnosis of  By: Percival Spanish, MD, Farrel Gordon    . Former smoker, stopped smoking in distant past 03/15/2018  . Hematuria 09/16/2011  . History of migraine   . OA (osteoarthritis)   . Obese   . OSA on CPAP 09/25/2015   Diagnosed at the [facility] 2016   . Prostate cancer ([place]) 11/30/2019  . PVC's (premature ventricular contractions)   . Seasonal allergies   . Sinus bradycardia   . Squamous cell carcinoma of skin   . TIA (transient ischemic attack)    questionable  . Vitamin B 12 deficiency   . Vitamin D deficiency 11/26/2010    Past Surgical History: Past Surgical History:  Procedure Laterality Date  . CHOLECYSTECTOMY  2001  . COLONOSCOPY    . KNEE ARTHROPLASTY Right 03/14/2020   Procedure: COMPUTER ASSISTED TOTAL KNEE ARTHROPLASTY;  Surgeon: Rod Can, MD;  Location: [facility]S;  Service: Orthopedics;  Laterality: Right;  . TONSILLECTOMY AND ADENOIDECTOMY  1952    Family History: Family History  Problem Relation Age of Onset  . Diabetes Mother   . COPD Father   . Stroke Paternal Grandfather     Social History: Social History   Socioeconomic History  .  Marital status: Married    Spouse name: Not on file  . Number of children: Not on file  . Years of education: Not on file  . Highest education level: Not on file  Occupational History  . Occupation: retired  Tobacco Use  . Smoking status: Former Smoker    Types: Cigarettes    Quit date: 08/18/1965    Years since quitting: 54.7  . Smokeless tobacco: Never Used  . Tobacco comment: pt quit smoking 53 yrs ago  Vaping Use  . Vaping Use: Never used  Substance and Sexual Activity  . Alcohol use: Yes    Alcohol/week: 2.0 - 6.0 standard drinks    Types: 2 - 6 Cans of beer per week  . Drug use: No  . Sexual activity: Yes  Other Topics Concern  . Not on file  Social History Narrative  . Not on file   Social Determinants of Health   Financial Resource Strain: Low Risk   . Difficulty of Paying Living Expenses: Not hard at all  Food Insecurity: No Food Insecurity  . Worried About Charity fundraiser in the Last Year: Never true  . Ran Out of Food in the Last Year: Never true  Transportation Needs: No Transportation Needs  . Lack of Transportation (Medical): No  . Lack of Transportation (Non-Medical): No  Physical Activity: Unknown  . Days of Exercise per Week: 7 days  . Minutes of Exercise per Session: Not on file  Stress: No Stress Concern Present  . Feeling of Stress : Not at all  Social Connections: Moderately Isolated  . Frequency of Communication with Friends and Family: More than three times a week  . Frequency of Social Gatherings with Friends and Family: More than three times a week  . Attends Religious Services: Never  . Active Member of Clubs or Organizations: No  . Attends Archivist Meetings: Never  . Marital Status: Married    Allergies:  Allergies  Allergen Reactions  . Tamsulosin Other (See Comments)    dizziness  . Cipro [Ciprofloxacin Hcl] Swelling    Knee swelling, joint pain  . Lipitor [Atorvastatin] Other (See Comments)    Leg Cramps  .  Lisinopril Cough    Objective:  Vital Signs:   Pulse Rate:  [25-106] 87 (09/30 1525) Resp:  [15-29] 20 (09/30 1525) BP: (97-146)/(49-130) 99/49 (09/30 1525) SpO2:  [85 %-99 %] 94 % (09/30 1525)    Weight change: Filed Weights   05/16/20 1200  Weight: 108.9 kg    Intake/Output:   Intake/Output Summary (Last 24 hours) at 05/17/2020 1548 Last data filed at 05/17/2020 1300 Gross per 24 hour  Intake --  Output 1425 ml  Net -1425 ml      Physical Exam    General:   No resp difficulty HEENT: normal Neck: supple. JVP to jaw . Carotids 2+ bilat; no bruits. No lymphadenopathy or thyromegaly appreciated. Cor: PMI nondisplaced. Irregular rate & rhythm. No rubs, gallops or murmurs. Lungs: clear Abdomen: soft, nontender, nondistended. No hepatosplenomegaly. No bruits or masses. Good bowel sounds. Extremities: no cyanosis, clubbing, rash, edema Neuro: alert & orientedx3, cranial nerves grossly intact. moves all 4 extremities w/o difficulty. Affect pleasant   Telemetry  A fib    EKG    A Fib RVR 114    Labs   Basic Metabolic Panel: Recent Labs  Lab 05/16/20 1150 05/17/20 0824 05/17/20 0829  NA 137 139 138  K 4.0 3.5 3.6  CL 103  --   --   CO2 18*  --   --   GLUCOSE 115*  --   --   BUN 14  --   --   CREATININE 1.25*  --   --   CALCIUM 8.7*  --   --     Liver Function Tests: Recent Labs  Lab 05/16/20 1150  AST 35  ALT 26  ALKPHOS 50  BILITOT 1.9*  PROT 6.9  ALBUMIN 3.7   No results for input(s): LIPASE, AMYLASE in the last 168 hours. No results for input(s): AMMONIA in the last 168 hours.  CBC: Recent Labs  Lab 05/16/20 1150 05/17/20 0500 05/17/20 0824 05/17/20 0829  WBC 5.4 6.6  --   --   HGB 12.3* 12.2* 12.2* 11.9*  HCT 37.8* 36.8* 36.0* 35.0*  MCV 89.6 88.5  --   --   PLT 170 183  --   --     Cardiac Enzymes: No results for input(s): CKTOTAL, CKMB, CKMBINDEX, TROPONINI in the last 168 hours.  BNP: BNP (last 3 results) Recent Labs     05/16/20 1155  BNP 415.7*    ProBNP (last 3 results) No results for input(s): PROBNP in the last 8760 hours.   CBG: No results for input(s): GLUCAP in the last 168 hours.  Coagulation Studies: No results for input(s): LABPROT, INR in the last 72 hours.   Imaging   CT ANGIO CHEST PE W OR WO CONTRAST  Result Date: 05/16/2020 CLINICAL DATA:  High probability suspicion for pulmonary embolism. Short of breath. EXAM: CT ANGIOGRAPHY CHEST WITH CONTRAST TECHNIQUE: Multidetector CT imaging of the chest was performed using the standard protocol during bolus administration of intravenous contrast. Multiplanar CT image reconstructions and MIPs were obtained to evaluate the vascular anatomy. CONTRAST:  10mL OMNIPAQUE IOHEXOL 350 MG/ML SOLN COMPARISON:  None. FINDINGS: Cardiovascular: No filling defects within the pulmonary arteries to suggest acute pulmonary embolism. Three-vessel coronary artery calcification and aortic atherosclerotic calcification. Mediastinum/Nodes: No axillary or supraclavicular adenopathy. No mediastinal or hilar adenopathy. No pericardial fluid. Esophagus normal. Lungs/Pleura: Moderate bilateral pleural effusions. Interlobular septal thickening consistent central edema. No focal infiltrate. No pulmonary infarction. No pneumothorax. Upper Abdomen: Limited view of the liver, kidneys, pancreas are unremarkable. Normal adrenal  glands. Musculoskeletal: No aggressive osseous lesion. Degenerative osteophytosis of the spine. Review of the MIP images confirms the above findings. IMPRESSION: 1. No acute pulmonary embolism. 2. Pleural effusion interstitial edema suggest congestive heart failure. 3. Coronary artery calcification and Aortic Atherosclerosis (ICD10-I70.0). Electronically Signed   By: Suzy Bouchard M.D.   On: 05/16/2020 19:29   CARDIAC CATHETERIZATION  Result Date: 05/17/2020  Ost LM to Mid LM lesion is 99% stenosed.  Ost RCA to Prox RCA lesion is 99% stenosed.  Prox RCA to  Mid RCA lesion is 80% stenosed.  Eric Stokes is a 74 y.o. male  [id number] LOCATION:  FACILITY: [facility] PHYSICIAN: Quay Burow, M.D. March 27, 1946 DATE OF PROCEDURE:  05/17/2020 DATE OF DISCHARGE: CARDIAC CATHETERIZATION History obtained from chart review.This is a 74yo male with a hx of sinus bradycardia and PVCs, HTN, OSA on CPAP, HLD and prostate CA.  He has a remote hx of ETT in 2015 that was normal and then again in 2018 that was normal.  Evaluated earlier this year for knee surgery which he underwent without complications.  He is quite active at home but for the past month has noticed intermittent chest pain  and DOE.  He has noticed a significant decline in his exercise capacity due to DOE and exertional CP.  He has chronic RLE edema ever since having his knee surgery and was on Eliquis in the periop period.   Today had worsening symptoms of CP and SOB and called EMS.  He was found to be in afib with RVR and started on IV Cardizem gtt with HR improve to the low 100's.  Started on IV heparin gtt.  COVID neg.  BNP elevated at 415 and Cxray c/w CHF.  Initial hsTrop 1681 and increased to 4034.  He has not had any further CP.  Cards now asked to admit for further workup of elevated Trop, CP and CHF.   Eric. Gunnels has critical left main/three-vessel disease with high filling pressures.  His LVEDP was 17 but his V wave was 37 suggesting that he has mitral regurgitation.  A 2D echo will be obtained.  He will need additional diuresis.  I discussed his case with Dr. Murvin Natal, cardiothoracic surgeon, who agrees that he needs CABG which will tentatively be planned for tomorrow morning.  In the interim, we will put him in unit 2H for closer observation.  The radial and antecubital sheath were removed.  TR band was placed on the right wrist to achieve patent hemostasis.  The patient left lab in stable condition.  Heparin will be restarted 4 hours after sheath removal without a bolus. Quay Burow. MD, [facility]  05/17/2020 8:53 AM   ECHOCARDIOGRAM COMPLETE  Result Date: 05/17/2020    ECHOCARDIOGRAM REPORT   Patient Name:   Eric Stokes Date of Exam: 05/17/2020 Medical Rec #:  [id number]          Height:       70.0 in Accession #:    [id number]         Weight:       240.0 lb Date of Birth:  09-15-1945         BSA:          2.255 m Patient Age:    20 years           BP:           132/79 mmHg Patient Gender: M  HR:           88 bpm. Exam Location:  Inpatient Procedure: 2D Echo and Intracardiac Opacification Agent Indications:     Non-STEMI (non-ST elevated myocardial infarction) ([place]) [I21.4                  (ICD-10-CM)]                  Paroxysmal atrial fibrillation (HCC) [I48.0 (ICD-10-CM)]                  Acute on chronic combined systolic and diastolic CHF                  (congestive heart failure) ([place]) [I50.43 (ICD-10-CM)]  History:         Patient has prior history of Echocardiogram examinations, most                  recent 03/01/2019. CAD, COPD and TIA, Aortic Valve Disease,                  Arrythmias:Palpitations, Signs/Symptoms:Dyspnea and Chest                  discomfort; Risk Factors:Hypertension, Former Smoker and                  Dyslipidemia. History of cancer.  Sonographer:     Darlina Sicilian RDCS Referring Phys:  [id number] Leanor Kail Diagnosing Phys: Gwyndolyn Kaufman MD  Sonographer Comments: Image acquisition challenging due to respiratory motion and Post cath unable to turn. IMPRESSIONS  1. There is akinesis of the mid-to-distal anterolateral, all mid-to-distal septal, mid-to-distal anterior, and all apical segments. The rest of the LV segments are hypokinetic. Findings are suggestive of multivessel CAD.Marland Kitchen Left ventricular ejection fraction, by estimation, is 25 to 30%. The left ventricle has severely decreased function. The left ventricle demonstrates regional wall motion abnormalities (see scoring diagram/findings for description). There is moderate concentric left  ventricular hypertrophy. Left ventricular diastolic parameters are indeterminate.  2. Right ventricular systolic function is normal. The right ventricular size is normal. There is mildly elevated pulmonary artery systolic pressure.  3. Left atrial size was moderately dilated.  4. The mitral valve is degenerative. Mild mitral valve regurgitation. No evidence of mitral stenosis.  5. The aortic valve is tricuspid. There is moderate calcification of the aortic valve. There is moderate thickening of the aortic valve. Aortic valve regurgitation is not visualized. Mild aortic valve stenosis.  6. The inferior vena cava is dilated in size with >50% respiratory variability, suggesting right atrial pressure of 8 mmHg. Comparison(s): Compared to prior TTE on 03/11/2019, there is now severe LV dysfunction with regional wall motion abnormalities as described above. FINDINGS  Left Ventricle: There is akinesis of the mid-to-distal anterolateral, all mid-to-distal septal, mid-to-distal anterior, and all apical segments. The rest of the LV segments are hypokinetic. Findings are suggestive of multivessel CAD. Left ventricular ejection fraction, by estimation, is 25 to 30%. Definity contrast agent was given IV to delineate the left ventricular endocardial borders. The left ventricular internal cavity size was normal in size. There is moderate concentric left ventricular hypertrophy. Left ventricular diastolic parameters are indeterminate. Right Ventricle: The right ventricular size is normal. Right ventricular systolic function is normal. There is mildly elevated pulmonary artery systolic pressure. The tricuspid regurgitant velocity is 2.70 m/s, and with an assumed right atrial pressure of 8 mmHg, the estimated right ventricular systolic pressure is 62.9 mmHg. Left Atrium: Left atrial  size was moderately dilated. Right Atrium: Right atrial size was normal in size. Pericardium: There is no evidence of pericardial effusion. Mitral Valve:  The mitral valve is degenerative in appearance. There is mild thickening of the mitral valve leaflet(s). There is mild calcification of the mitral valve leaflet(s). Mild mitral annular calcification. Mild mitral valve regurgitation. No evidence of mitral valve stenosis. Tricuspid Valve: The tricuspid valve is normal in structure. Tricuspid valve regurgitation is mild. Aortic Valve: The aortic valve is tricuspid. There is moderate calcification of the aortic valve. There is moderate thickening of the aortic valve. There is mild to moderate aortic valve annular calcification. Aortic valve regurgitation is not visualized. Mild aortic stenosis is present. Aortic valve mean gradient measures 5.5 mmHg. Aortic valve peak gradient measures 10.1 mmHg. Aortic valve area, by VTI measures 1.35 cm. AVA 1.5 cm2. Pulmonic Valve: The pulmonic valve was not well visualized. Pulmonic valve regurgitation is trivial. Aorta: The aortic root and ascending aorta are structurally normal, with no evidence of dilitation. Venous: The inferior vena cava is dilated in size with greater than 50% respiratory variability, suggesting right atrial pressure of 8 mmHg. IAS/Shunts: No atrial level shunt detected by color flow Doppler.  LEFT VENTRICLE PLAX 2D LVOT diam:     2.20 cm      Diastology LV SV:         36           LV e' medial:    5.74 cm/s LV SV Index:   16           LV E/e' medial:  18.0 LVOT Area:     3.80 cm     LV e' lateral:   10.45 cm/s                             LV E/e' lateral: 9.9  LV Volumes (MOD) LV vol d, MOD A2C: 239.0 ml LV vol d, MOD A4C: 242.0 ml LV vol s, MOD A2C: 154.0 ml LV vol s, MOD A4C: 191.0 ml LV SV MOD A2C:     85.0 ml LV SV MOD A4C:     242.0 ml LV SV MOD BP:      69.3 ml RIGHT VENTRICLE TAPSE (M-mode): 1.2 cm LEFT ATRIUM             Index       RIGHT ATRIUM           Index LA Vol (A2C):   60.6 ml 26.87 ml/m RA Area:     16.10 cm LA Vol (A4C):   87.3 ml 38.71 ml/m RA Volume:   36.50 ml  16.18 ml/m LA Biplane  Vol: 73.0 ml 32.37 ml/m  AORTIC VALVE AV Area (Vmax):    1.34 cm AV Area (Vmean):   1.14 cm AV Area (VTI):     1.35 cm AV Vmax:           158.75 cm/s AV Vmean:          109.125 cm/s AV VTI:            0.267 m AV Peak Grad:      10.1 mmHg AV Mean Grad:      5.5 mmHg LVOT Vmax:         55.90 cm/s LVOT Vmean:        32.600 cm/s LVOT VTI:          0.095 m LVOT/AV VTI ratio:  0.35  AORTA Ao Root diam: 3.30 cm Ao Asc diam:  3.60 cm MITRAL VALVE                TRICUSPID VALVE MV Area (PHT): 4.30 cm     TR Peak grad:   29.2 mmHg MV Decel Time: 176 msec     TR Vmax:        270.00 cm/s Eric Peak grad: 59.0 mmHg Eric Mean grad: 38.0 mmHg     SHUNTS Eric Vmax:      384.00 cm/s   Systemic VTI:  0.09 m Eric Vmean:     295.0 cm/s    Systemic Diam: 2.20 cm MV E velocity: 103.33 cm/s Gwyndolyn Kaufman MD Electronically signed by Gwyndolyn Kaufman MD Signature Date/Time: 05/17/2020/12:57:04 PM    Final (Updated)       Medications:     Current Medications: . [MAR Hold] alfuzosin  10 mg Oral Q breakfast  . [MAR Hold] aspirin  324 mg Oral Once  . [START ON 05/18/2020] aspirin  81 mg Oral Daily  . [MAR Hold] aspirin EC  81 mg Oral Daily  . [MAR Hold] cholecalciferol  2,000 Units Oral Daily  . [START ON 05/18/2020] epinephrine  0-10 mcg/min Intravenous To OR  . [MAR Hold] finasteride  5 mg Oral Daily  . [START ON 05/18/2020] heparin-papaverine-plasmalyte irrigation   Irrigation To OR  . [START ON 05/18/2020] insulin   Intravenous To OR  . [MAR Hold] latanoprost  1 drop Both Eyes QHS  . [START ON 05/18/2020] magnesium sulfate  40 mEq Other To OR  . [MAR Hold] montelukast  10 mg Oral QHS  . [START ON 05/18/2020] phenylephrine  30-200 mcg/min Intravenous To OR  . [START ON 05/18/2020] potassium chloride  80 mEq Other To OR  . [MAR Hold] sodium chloride flush  3 mL Intravenous Q12H  . [START ON 05/18/2020] tranexamic acid  15 mg/kg Intravenous To OR  . [START ON 05/18/2020] tranexamic acid  2 mg/kg Intracatheter To OR      Infusions: . sodium chloride 1 mL/kg/hr (05/17/20 0430)  . [START ON 05/18/2020] cefUROXime (ZINACEF)  IV    . [START ON 05/18/2020] cefUROXime (ZINACEF)  IV    . [START ON 05/18/2020] dexmedetomidine    . diltiazem (CARDIZEM) infusion 15 mg/hr (05/17/20 1251)  . [START ON 05/18/2020] heparin 30,000 units/NS 1000 mL solution for CELLSAVER    . heparin    . [START ON 05/18/2020] milrinone    . [START ON 05/18/2020] nitroGLYCERIN    . [START ON 05/18/2020] norepinephrine    . [START ON 05/18/2020] tranexamic acid (CYKLOKAPRON) infusion ([facility])    . [START ON 05/18/2020] vancomycin          Assessment/Plan   1.NSTEMI  HS  HS Trop [id number]>[id number]. LHC with severe multivessel disease. CT surgery consulted.  Plan for CABG tomorrow.   2. Acute Systolic Heart Failure , ICM  ECHO in 2020 showed EF 60-65%. ECHO today showed EF is down to 25-30 %. RV normal.  Placing swan and IAPB today to optimize prior to surgery. Planning to diurese with IV lasix.   3. OSA Uses CPAP at home.   4. A fib RVR  On heparin drip. - With reduced EF stop diltiazem drip. Start amio drip.   5. Prostate Cancer   6. H/O  R Total Knee Replacement  02/2020    Length of Stay: 1  Amy Clegg, NP  05/17/2020, 3:48 PM  Advanced Heart Failure Team Pager  [phone] (M-F; 7a - 4p)  Please contact [facility] for night-coverage after hours (4p -7a ) and weekends on [email or web address]  Agree with above.  74 y/o male with obesity, HTN, OSA on CPAP, HLD and prostate CA. Admitted with [place] and HF.   Found to have severe 3v CAD with 95% LM. EF 25-30%  Seen by Dr. Orvan Seen and plan for CABG tomorrow but post-cath had more SOB. HF team consulted for assistance with medical management and placement of IABP and Swan,.   He is currently more SOB than earlier today. Denies CP.   General:  elderly obese male  + SOB at rest HEENT: normal Neck: supple. JVP to jaw Carotids 2+ bilat; no bruits. No lymphadenopathy or thryomegaly  appreciated. Cor: PMI nondisplaced. Irregular rate & rhythm. No rubs, gallops or murmurs. Lungs: + crackles  Abdomen:obese soft, nontender, nondistended. No hepatosplenomegaly. No bruits or masses. Good bowel sounds. Extremities: no cyanosis, clubbing, rash, 1+ edema Neuro: alert & orientedx3, cranial nerves grossly intact. moves all 4 extremities w/o difficulty. Affect pleasant  He has critical LM disease.   Swan placed and showed markedly elevated filling pressures and low cardiac output. IABP subsequently placed.  Will diurese overnight and attempt to optimize for OR tomorrow. Will stop diltiazem and switch to amio for AF. Start hepatin for IABP.   CRITICAL CARE Performed by: Glori Bickers  Total critical care time: 45 minutes  Critical care time was exclusive of separately billable procedures and treating other patients.  Critical care was necessary to treat or prevent imminent or life-threatening deterioration.  Critical care was time spent personally by me (independent of midlevel providers or residents) on the following activities: development of treatment plan with patient and/or surrogate as well as nursing, discussions with consultants, evaluation of patient's response to treatment, examination of patient, obtaining history from patient or surrogate, ordering and performing treatments and interventions, ordering and review of laboratory studies, ordering and review of radiographic studies, pulse oximetry and re-evaluation of patient's condition.  Glori Bickers, MD  11:21 PM

## 2020-05-17 NOTE — Progress Notes (Signed)
TR Band removed and R Radial site level 0. Dressing applied to site.

## 2020-05-17 NOTE — Hospital Course (Addendum)
HPI: This is a 74 year old male with a past medical history of hypertension, hyperlipidemia, OSA (on CPAP), prostate cancer who had complaints of increasing shortness of breath with activity and chest tightness. According to the patient, he is fairly active at home (recently dug a trench and rebuilt a deck) but has noticed intermittent chest tightness (denies rest pain) and dyspnea on exertion. He also has complaints of chronic RLE edema since having had knee surgery this past July as well as decreased appetite. Patient states he had diaphoresis on most recent chest tightness and has had some radiation to his right arm. EMS was summonsed on 09/29 due to worsening chest discomfort. On presentation to [facility] ED, patient was in atrial fibrillation with RVR. He was put on a Cardizem drip. Initial Troponin I (high sensitivity) was 1681 and has peaked at 8,044. He ruled in for a NSTEMI. BNP was elevated at 415.7 as well. He underwent a cardiac catheterization earlier this am and was found to have a 99% ostial to mid left main, 99% ostial to proximal RCA, and 80% proximal to mid RCA. Dr. Orvan Seen has been consulted for consideration of coronary artery bypass grafting surgery. Echo is ordered. Of note, patient noted to have mild aortic stenosis on echo done in July 2020. At the time of my exam, patient is chest pain free.  Dr. Orvan Seen discussed the need for coronary artery bypass grafting surgery. Potential risks, benefits, and complications of the surgery were discussed with the patient and he agreed to proceed with surgery.   Hospital Course:  On 05/18/2020 Mr. Doby underwent a modified Maze procedure and coronary artery bypass grafting x4 with Dr. Julien Girt. He tolerated the procedure well and was transferred to the surgical ICU for continued care. Postop day 1 he remained intubated but was moving all 4 extremities. He was on multiple inotropes and his intra-aortic balloon pump was set to 1:1. We ordered an  echocardiogram to assess LV function and to rule out tamponade. We kept his chest tubes in place for now. He remained intubated and sedated postop day 2. We consulted the advanced heart failure team for assistance. He continued to diurese on a Lasix drip. On echocardiogram his estimated left ventricular ejection fraction was 25%. There was no evidence of a pericardial effusion. Postop day 3 he remained intubated. We continue to wean his intra-aortic balloon pump. His balloon pump was removed on 10 /4. He was extubated postop day 5. We discontinued his Swan-Ganz catheter and arterial line. We consider core track placement and consulted SLP due to prolonged ventilation. We began to mobilize patient. He progressed slowly with ambulation due to his deconditioned state. He was transferred to [facility] where the IV milrinone was continued for several more days. He developed a pericardial friction rub and was started on colchicine for suspected pericarditis. Follow up echo showed no significant change in his depressed LV function and no significant pericardial effusion.

## 2020-05-17 NOTE — Interval H&P Note (Signed)
History and Physical Interval Note:  05/17/2020 4:14 PM  Eric Stokes  has presented today for surgery, with the diagnosis of CAD.  The various methods of treatment have been discussed with the patient and family. After consideration of risks, benefits and other options for treatment, the patient has consented to  Procedure(s): IABP INSERTION (N/A) and swan-ganz placement as a surgical intervention.  The patient's history has been reviewed, patient examined, no change in status, stable for surgery.  I have reviewed the patient's chart and labs.  Questions were answered to the patient's satisfaction.     Mohd Clemons

## 2020-05-17 NOTE — ED Notes (Signed)
Spoke with pharmacist regarding heparin rate increase to 16. Also made pharmacy aware pt takes Alfuzosin 5mg  bid and is requesting dose for elimination needs post administration of Lasix. Awaiting medication to from pharmacy at this time, pt made aware.

## 2020-05-17 NOTE — Progress Notes (Signed)
Pre-CABG study completed.   Please see CV Proc for preliminary results.   Eric Stokes, RDMS  

## 2020-05-17 NOTE — Progress Notes (Signed)
[place] for IV heparin Indication: chest pain/ACS and atrial fibrillation  Allergies  Allergen Reactions  . Tamsulosin Other (See Comments)    dizziness  . Cipro [Ciprofloxacin Hcl] Swelling    Knee swelling, joint pain  . Lipitor [Atorvastatin] Other (See Comments)    Leg Cramps  . Lisinopril Cough    Patient Measurements: Height: 5\' 10"  (177.8 cm) Weight: 108.9 kg (240 lb) IBW/kg (Calculated) : 73 Heparin Dosing Weight: 96.5kg  Vital Signs: BP: 132/79 (09/30 0900) Pulse Rate: 81 (09/30 0900)  Labs: Recent Labs    05/16/20 1150 05/16/20 1150 05/16/20 1530 05/16/20 1830 05/16/20 2300 05/17/20 0500 05/17/20 0500 05/17/20 0537 05/17/20 0824 05/17/20 0829  HGB 12.3*   < >  --   --   --  12.2*   < >  --  12.2* 11.9*  HCT 37.8*   < >  --   --   --  36.8*  --   --  36.0* 35.0*  PLT 170  --   --   --   --  183  --   --   --   --   HEPARINUNFRC  --   --   --   --  0.24*  --   --   --   --   --   CREATININE 1.25*  --   --   --   --   --   --   --   --   --   TROPONINIHS 1,681*   < > 4,034* 8,044*  --   --   --  6,860*  --   --    < > = values in this interval not displayed.    Estimated Creatinine Clearance: 64.1 mL/min (A) (by C-G formula based on SCr of 1.25 mg/dL (H)).   Assessment: 73yoM presenting with complaints of intermittent irregular heartbeat accompanied by chest pain for the past few weeks. Troponins are elevated ~1700. EKG shows afib with RVR.  CBC WNL. Not on [facility] PTA - record shows apixaban ordered 03/15/20 (VTE ppx per ortho) but confirmed he did not pick up at the pharmacy.    Pharmacy dosing IV heparin for ACS/afib. Heparin level subtherapeutic this am and rate increased overnight. Cath now showing multivessel disease, CABG planned for tomorrow. Orders to resume heparin post cath.   Goal of Therapy:  Heparin level 0.3-0.7 units/ml Monitor platelets by anticoagulation protocol: Yes   Plan:  Restart heparin  infusion at 1600 units/hr Will f/u 8 hr heparin level  Erin Hearing PharmD., BCPS Clinical Pharmacist 05/17/2020 1:17 PM

## 2020-05-17 NOTE — Progress Notes (Signed)
  Echocardiogram 2D Echocardiogram with definity has been performed.  Darlina Sicilian M 05/17/2020, 11:12 AM

## 2020-05-17 NOTE — Interval H&P Note (Signed)
Cath Lab Visit (complete for each Cath Lab visit)  Clinical Evaluation Leading to the Procedure:   ACS: Yes.    Non-ACS:    Anginal Classification: CCS III  Anti-ischemic medical therapy: No Therapy  Non-Invasive Test Results: No non-invasive testing performed  Prior CABG: No previous CABG      History and Physical Interval Note:  05/17/2020 7:44 AM  Eric Stokes  has presented today for surgery, with the diagnosis of nstemi - afib - avr.  The various methods of treatment have been discussed with the patient and family. After consideration of risks, benefits and other options for treatment, the patient has consented to  Procedure(s): RIGHT/LEFT HEART CATH AND CORONARY ANGIOGRAPHY (N/A) as a surgical intervention.  The patient's history has been reviewed, patient examined, no change in status, stable for surgery.  I have reviewed the patient's chart and labs.  Questions were answered to the patient's satisfaction.     Quay Burow

## 2020-05-17 NOTE — Consult Note (Addendum)
[address]       [place] [address]             [phone]        Eric Stokes  [facility] Record #[id number] Date of Birth: Jul 02, 1946  Referring: Dr. Gwenlyn Found, MD Primary Care: Leamon Arnt, MD Primary Cardiologist: Dr. Burt Knack, MD  Chief Complaint:    Chief Complaint  Patient presents with  . Shortness of Breath  . Chest Tightness  Reason for consultation: Coronary artery disease  History of Present Illness:     This is a 74 year old male with a past medical history of hypertension, hyperlipidemia, OSA (on CPAP), prostate cancer who had complaints of increasing shortness of breath with activity and chest tightness. According to the patient, he is fairly active at home (recently dug a trench and rebuilt a deck) but has noticed intermittent chest tightness (denies rest pain) and dyspnea on exertion. He also has complaints of chronic RLE edema since having had knee surgery this past July as well as decreased appetite. Patient states he had diaphoresis on most recent chest tightness and has had some radiation to his right arm. EMS was summonsed on 09/29 due to worsening chest discomfort. On presentation to [facility] ED, patient was in atrial fibrillation with RVR. He was put on a Cardizem drip. Initial Troponin I (high sensitivity) was 1681 and has peaked at 8,044. He ruled in for a NSTEMI. BNP was elevated at 415.7 as well. He underwent a cardiac catheterization earlier this am and was found to have a 99% ostial to mid left main, 99% ostial to proximal RCA, and 80% proximal to mid RCA. Dr. Orvan Seen has been consulted for consideration of coronary artery bypass grafting surgery. Echo is ordered. Of note, patient noted to have mild aortic stenosis on echo done in July 2020. At the time of my exam, patient is chest pain free.  Current Activity/ Functional Status: Patient is independent with mobility/ambulation, transfers, ADL's, IADL's.   Zubrod  Score: At the time of surgery this patient's most appropriate activity status/level should be described as: []     0    Normal activity, no symptoms [x]     1    Restricted in physical strenuous activity but ambulatory, able to do out light work []     2    Ambulatory and capable of self care, unable to do work activities, up and about more than 50%  Of the time                            []     3    Only limited self care, in bed greater than 50% of waking hours []     4    Completely disabled, no self care, confined to bed or chair []     5    Moribund  Past Medical History:  Diagnosis Date  . Aortic stenosis, mild    noted on ECHO  . Asthma   . Benign essential hypertension, on Norvasc and Amlodipine 12/21/2008   Followed by [facility]   . BPH (benign prostatic hyperplasia) 09/03/2012  . Carotid bruit present 06/15/2018   2015 Carotid Dopplers:  Essentially normal carotid arteries, with very slight hard plaque in the proximal ICA's and serpentine distal vessels. 1-39% bilateral ICA stenosis. Patent vertebral arteries with antegrade flow. Normal subclavian arteries, bilaterally.  . Colonic polyp 01/09/2020  . COPD (  chronic obstructive pulmonary disease) ([place])   . Dyslipidemia 12/21/2008   Qualifier: Diagnosis of  By: Percival Spanish, MD, Farrel Gordon    . Essential hypertension, benign 12/21/2008   Qualifier: Diagnosis of  By: Percival Spanish, MD, Farrel Gordon    . Former smoker, stopped smoking in distant past 03/15/2018  . Hematuria 09/16/2011  . History of migraine   . OA (osteoarthritis)   . Obese   . OSA on CPAP 09/25/2015   Diagnosed at the [facility] 2016   . Prostate cancer ([place]) 11/30/2019  . PVC's (premature ventricular contractions)   . Seasonal allergies   . Sinus bradycardia   . Squamous cell carcinoma of skin   . TIA (transient ischemic attack)    questionable  . Vitamin B 12 deficiency   . Vitamin D deficiency 11/26/2010    Past Surgical History:  Procedure Laterality Date  .  CHOLECYSTECTOMY  2001  . COLONOSCOPY    . RIGHT KNEE ARTHROPLASTY Right 03/14/2020   Procedure: COMPUTER ASSISTED TOTAL KNEE ARTHROPLASTY;  Surgeon: Rod Can, MD;  Location: [facility];  Service: Orthopedics;  Laterality: Right;  . TONSILLECTOMY AND ADENOIDECTOMY  1952    Social History   Tobacco Use  Smoking Status Former Smoker  . Types: Cigarettes. He did smoke 1 1/2 packs daily  . Quit date: 08/18/1965  . Years since quitting: 54.7  Smokeless Tobacco Never Used  Tobacco Comment   pt quit smoking 53 yrs ago    Social History   Substance and Sexual Activity  Alcohol Use Yes  . Alcohol/week: 2.0 - 6.0 standard drinks  . Types: 2 - 6 Cans of beer per week  Patient is married and has 2 daughters who live locally He is retired from [employer]  Allergies: Allergies  Allergen Reactions  . Tamsulosin Other (See Comments)    dizziness  . Cipro [Ciprofloxacin Hcl] Swelling    Knee swelling, joint pain  . Lipitor [Atorvastatin] Other (See Comments)    Leg Cramps  . Lisinopril Cough    Current Facility-Administered Medications  Medication Dose Route Frequency Provider Last Rate Last Admin  . 0.9 %  sodium chloride infusion   Intravenous Continuous Lorretta Harp, MD      . 0.9% sodium chloride infusion  1 mL/kg/hr Intravenous Continuous Lorretta Harp, MD 108.9 mL/hr at 05/17/20 0430 1 mL/kg/hr at 05/17/20 0430  . [MAR Hold] acetaminophen (TYLENOL) tablet 650 mg  650 mg Oral Q4H PRN Bhagat, Bhavinkumar, PA      . [MAR Hold] alfuzosin (UROXATRAL) 24 hr tablet 10 mg  10 mg Oral Q breakfast Bhagat, Bhavinkumar, PA   10 mg at 05/17/20 0111  . [MAR Hold] aspirin chewable tablet 324 mg  324 mg Oral Once [place], [place], PA      . [START ON 05/18/2020] aspirin chewable tablet 81 mg  81 mg Oral Daily Lorretta Harp, MD      . Doug Sou Hold] aspirin EC tablet 81 mg  81 mg Oral Daily Sueanne Margarita, MD      . Doug Sou Hold] cholecalciferol (VITAMIN D3) tablet 2,000 Units  2,000  Units Oral Daily Bhagat, Bhavinkumar, PA   2,000 Units at 05/16/20 1815  . diltiazem (CARDIZEM) 125 mg in dextrose 5% 125 mL (1 mg/mL) infusion  5-15 mg/hr Intravenous Continuous Bhagat, Bhavinkumar, PA 15 mL/hr at 05/17/20 0500 15 mg/hr at 05/17/20 0500  . [MAR Hold] finasteride (PROSCAR) tablet 5 mg  5 mg Oral Daily Bhagat, [place], PA   5  mg at 05/16/20 1814  . heparin ADULT infusion 100 units/mL (25000 units/268mL sodium chloride 0.45%)  1,600 Units/hr Intravenous Continuous Franky Macho, RPH 16 mL/hr at 05/17/20 0021 1,600 Units/hr at 05/17/20 0021  . hydrALAZINE (APRESOLINE) injection 10 mg  10 mg Intravenous Q20 Min PRN Lorretta Harp, MD      . labetalol (NORMODYNE) injection 10 mg  10 mg Intravenous Q10 min PRN Lorretta Harp, MD      . [place] Hold] latanoprost (XALATAN) 0.005 % ophthalmic solution 1 drop  1 drop Both Eyes QHS Bhagat, Bhavinkumar, PA      . levalbuterol (XOPENEX) nebulizer solution 0.63 mg  0.63 mg Nebulization Q6H PRN Lorretta Harp, MD   0.63 mg at 05/17/20 0942  . [MAR Hold] montelukast (SINGULAIR) tablet 10 mg  10 mg Oral QHS Bhagat, Bhavinkumar, PA      . morphine 2 MG/ML injection 2 mg  2 mg Intravenous Q1H PRN Lorretta Harp, MD      . [place] Hold] nitroGLYCERIN (NITROSTAT) SL tablet 0.4 mg  0.4 mg Sublingual Q5 Min x 3 PRN Bhagat, Bhavinkumar, PA      . [MAR Hold] ondansetron (ZOFRAN) injection 4 mg  4 mg Intravenous Q6H PRN Bhagat, Bhavinkumar, PA      . [MAR Hold] sodium chloride flush (NS) 0.9 % injection 3 mL  3 mL Intravenous Q12H Bhagat, Bhavinkumar, PA   3 mL at 05/16/20 2248    Medications Prior to Admission  Medication Sig Dispense Refill Last Dose  . acetaminophen (TYLENOL) 500 MG tablet Take 1,000 mg by mouth at bedtime.   05/13/2020  . albuterol (VENTOLIN HFA) 108 (90 Base) MCG/ACT inhaler Inhale 2 puffs into the lungs every 6 (six) hours as needed for wheezing or shortness of breath. 8 g 1 05/16/2020 at Unknown time  . alfuzosin  (UROXATRAL) 10 MG 24 hr tablet Take 5 mg by mouth in the morning and at bedtime.   05/16/2020 at Unknown time  . carboxymethylcellulose (REFRESH PLUS) 0.5 % SOLN Place 1 drop into both eyes 2 (two) times daily as needed (dry eyes).   Past Week at Unknown time  . cetirizine (ZYRTEC) 10 MG tablet Take 10 mg by mouth daily.   05/15/2020 at Unknown time  . Cholecalciferol (VITAMIN D3) 2000 UNITS TABS Take 2,000 Int'l Units by mouth daily.    05/15/2020 at Unknown time  . finasteride (PROSCAR) 5 MG tablet Take 5 mg by mouth daily.   05/15/2020 at Unknown time  . fluticasone (FLONASE) 50 MCG/ACT nasal spray USE TWO SPRAYS IN EACH NOSTRIL DAILY AS NEEDED. (Patient taking differently: Place 2 sprays into both nostrils daily as needed for allergies. ) 48 mL 0 05/16/2020 at Unknown time  . ibuprofen (ADVIL) 400 MG tablet Take 400 mg by mouth every 6 (six) hours as needed for moderate pain.    05/15/2020 at Unknown time  . latanoprost (XALATAN) 0.005 % ophthalmic solution INSTILL 1 DROP INTO BOTH EYES AT BEDTIME (Patient taking differently: Place 1 drop into both eyes daily. ) 7.5 mL 0 05/16/2020 at Unknown time  . losartan (COZAAR) 25 MG tablet TAKE 1 TABLET BY MOUTH EVERY DAY (Patient taking differently: Take 25 mg by mouth daily. ) 90 tablet 0 05/15/2020 at Unknown time  . montelukast (SINGULAIR) 10 MG tablet TAKE 1 TABLET BY MOUTH EVERY DAY AT BEDTIME (Patient taking differently: Take 10 mg by mouth at bedtime. ) 90 tablet 0 05/15/2020 at Unknown time  . NON FORMULARY  Cpap at night     . pravastatin (PRAVACHOL) 20 MG tablet Take 20 mg by mouth at bedtime.    05/15/2020 at Unknown time  . apixaban (ELIQUIS) 2.5 MG TABS tablet Take 1 tablet (2.5 mg total) by mouth 2 (two) times daily. 60 tablet 0     Family History  Problem Relation Age of Onset  . Diabetes Mother   . COPD Father   . Stroke Paternal Grandfather    Review of Systems:     Cardiac Review of Systems: Y or  [ N   ]= no  Chest Tightness [  Y  ]   Resting SOB [   N] Exertional SOB  [Y  ]  Pedal Edema [ Y-right only  ]    Palpitations [ Y ] Syncope  Aqua.Slicker  ]   Presyncope Aqua.Slicker   ]  General Review of Systems: [Y] = yes [N  ]=no Constitional:  anorexia [Y  ];nausea Aqua.Slicker  ]; night sweats [ N ]; fever Aqua.Slicker  ]; or chills [ N ]                                                               Eye :  Amaurosis fugax[N  ]; Resp: cough [ N ];  wheezing[N  ];  hemoptysis[N  ];  GI:  vomiting[N  ];  melena[ N ];  hematochezia Aqua.Slicker  ];  GU: hematuria[N  ];   Prostate cancer [ Y ];  Musculosketetal:   joint swelling[ y-right knee ];  joint erythema[  ]; joint pain[ Y-right knee ];    Heme/Lymph: bruising[  ];  bleeding[  ];  anemia[  ];  Neuro: Sharlene.Ates  ];  stroke[ N ];  vertigo[  N];  seizures[ N ];       Endocrine: diabetes[N  ];  thyroid dysfunction[N  ];            Patient has been vaccinated against COVID     Physical Exam: BP 132/79   Pulse 81   Temp (!) 97.4 F (36.3 C) (Oral)   Resp (!) 24   Ht 5\' 10"  (1.778 m)   Wt 108.9 kg   SpO2 93%   BMI 34.44 kg/m    General appearance: alert, cooperative and no distress Head: Normocephalic, without obvious abnormality, atraumatic Neck: no carotid bruit, no JVD and supple, symmetrical, trachea midline Resp: clear to auscultation bilaterally Cardio: IRRR IRRR GI: Soft, non tender, bowel sounds present Extremities: No LE edema. Well healed midline right knee scar. Palpable DP/PT bilaterally Neurologic: Grossly normal  Diagnostic Studies & Laboratory data:     Recent Radiology Findings:   CT ANGIO CHEST PE W OR WO CONTRAST  Result Date: 05/16/2020 CLINICAL DATA:  High probability suspicion for pulmonary embolism. Short of breath. EXAM: CT ANGIOGRAPHY CHEST WITH CONTRAST TECHNIQUE: Multidetector CT imaging of the chest was performed using the standard protocol during bolus administration of intravenous contrast. Multiplanar CT image reconstructions and MIPs were obtained to evaluate the vascular anatomy.  CONTRAST:  60mL OMNIPAQUE IOHEXOL 350 MG/ML SOLN COMPARISON:  None. FINDINGS: Cardiovascular: No filling defects within the pulmonary arteries to suggest acute pulmonary embolism. Three-vessel coronary artery calcification and aortic atherosclerotic calcification. Mediastinum/Nodes: No axillary or supraclavicular adenopathy. No mediastinal or  hilar adenopathy. No pericardial fluid. Esophagus normal. Lungs/Pleura: Moderate bilateral pleural effusions. Interlobular septal thickening consistent central edema. No focal infiltrate. No pulmonary infarction. No pneumothorax. Upper Abdomen: Limited view of the liver, kidneys, pancreas are unremarkable. Normal adrenal glands. Musculoskeletal: No aggressive osseous lesion. Degenerative osteophytosis of the spine. Review of the MIP images confirms the above findings. IMPRESSION: 1. No acute pulmonary embolism. 2. Pleural effusion interstitial edema suggest congestive heart failure. 3. Coronary artery calcification and Aortic Atherosclerosis (ICD10-I70.0). Electronically Signed   By: Suzy Bouchard M.D.   On: 05/16/2020 19:29   CARDIAC CATHETERIZATION  Result Date: 05/17/2020  Ost LM to Mid LM lesion is 99% stenosed.  Ost RCA to Prox RCA lesion is 99% stenosed.  Prox RCA to Mid RCA lesion is 80% stenosed.  Eric Stokes is a 75 y.o. male  [id number] LOCATION:  FACILITY: [facility] PHYSICIAN: Quay Burow, M.D. 05-19-1946 DATE OF PROCEDURE:  05/17/2020 DATE OF DISCHARGE: CARDIAC CATHETERIZATION History obtained from chart review.This is a 74yo male with a hx of sinus bradycardia and PVCs, HTN, OSA on CPAP, HLD and prostate CA.  He has a remote hx of ETT in 2015 that was normal and then again in 2018 that was normal.  Evaluated earlier this year for knee surgery which he underwent without complications.  He is quite active at home but for the past month has noticed intermittent chest pain  and DOE.  He has noticed a significant decline in his exercise capacity due to DOE  and exertional CP.  He has chronic RLE edema ever since having his knee surgery and was on Eliquis in the periop period.   Today had worsening symptoms of CP and SOB and called EMS.  He was found to be in afib with RVR and started on IV Cardizem gtt with HR improve to the low 100's.  Started on IV heparin gtt.  COVID neg.  BNP elevated at 415 and Cxray c/w CHF.  Initial hsTrop 1681 and increased to 4034.  He has not had any further CP.  Cards now asked to admit for further workup of elevated Trop, CP and CHF.   Mr. Gentile has critical left main/three-vessel disease with high filling pressures.  His LVEDP was 17 but his V wave was 37 suggesting that he has mitral regurgitation.  A 2D echo will be obtained.  He will need additional diuresis.  I discussed his case with Dr. Murvin Natal, cardiothoracic surgeon, who agrees that he needs CABG which will tentatively be planned for tomorrow morning.  In the interim, we will put him in unit 2H for closer observation.  The radial and antecubital sheath were removed.  TR band was placed on the right wrist to achieve patent hemostasis.  The patient left lab in stable condition.  Heparin will be restarted 4 hours after sheath removal without a bolus. Quay Burow. MD, [facility] 05/17/2020 8:53 AM  Diagnostic Dominance: Right  Intervention  Implants   No implant documentation for this case.     DG Chest Port 1 View  Result Date: 05/16/2020 CLINICAL DATA:  Chest pain and shortness of breath today EXAM: PORTABLE CHEST 1 VIEW COMPARISON:  None. FINDINGS: Cardiomegaly and diffuse interstitial prominence with cephalized blood flow. Trace pleural fluid is possible on the right. Negative aortic contours for technique. IMPRESSION: CHF pattern. Electronically Signed   By: Monte Fantasia M.D.   On: 05/16/2020 11:58     I have independently reviewed the above radiologic studies and discussed with the  patient   Recent Lab Findings: Lab Results  Component Value Date    WBC 6.6 05/17/2020   HGB 12.2 (L) 05/17/2020   HCT 36.8 (L) 05/17/2020   PLT 183 05/17/2020   GLUCOSE 115 (H) 05/16/2020   CHOL 123 05/17/2020   TRIG 71 05/17/2020   HDL 32 (L) 05/17/2020   LDLDIRECT 108 (H) 04/11/2020   LDLCALC NOT CALCULATED 05/17/2020   ALT 26 05/16/2020   AST 35 05/16/2020   NA 137 05/16/2020   K 4.0 05/16/2020   CL 103 05/16/2020   CREATININE 1.25 (H) 05/16/2020   BUN 14 05/16/2020   CO2 18 (L) 05/16/2020   TSH 1.825 05/16/2020   INR 1.1 02/27/2020   HGBA1C 5.6 03/01/2019   Assessment / Plan:   1. S/p NSTEMI, coronary artery disease-on Cardizem and Heparin drips. Would benefit from coronary artery bypass grafting surgery;scheduled for Friday 10/01. Also, with recent a fib, could consider LA clip and +/- MAZE. 2. Atrial fibrillation-on Cardizem drip with better rate control than upon admission 3. Mild aortic stenosis-on echo July 2020 4. History of OSA-on CPAP 5. History of hypertension-on Losartan 25 mg daily prior to admission 6. History of hyperlipidemia-on Pravastain 20 mg at hs prior to admission 7. History of prostate cancer-on Finasteride and Afluzosin and has follow up with urologist in November  I  spent 20 minutes counseling the patient face to face.   Lars Pinks PA-C 05/17/2020 10:05 AM

## 2020-05-18 ENCOUNTER — Inpatient Hospital Stay (HOSPITAL_COMMUNITY): Payer: No Typology Code available for payment source | Admitting: Certified Registered"

## 2020-05-18 ENCOUNTER — Inpatient Hospital Stay (HOSPITAL_COMMUNITY)
Admission: EM | Disposition: A | Discharge status: Home Health | Payer: Self-pay | Source: Home / Self Care | Attending: Cardiothoracic Surgery

## 2020-05-18 ENCOUNTER — Inpatient Hospital Stay (HOSPITAL_COMMUNITY): Payer: No Typology Code available for payment source

## 2020-05-18 ENCOUNTER — Encounter (HOSPITAL_COMMUNITY): Payer: Self-pay | Admitting: Internal Medicine

## 2020-05-18 DIAGNOSIS — Z951 Presence of aortocoronary bypass graft: Secondary | ICD-10-CM

## 2020-05-18 DIAGNOSIS — I4891 Unspecified atrial fibrillation: Secondary | ICD-10-CM

## 2020-05-18 DIAGNOSIS — I5022 Chronic systolic (congestive) heart failure: Secondary | ICD-10-CM

## 2020-05-18 DIAGNOSIS — I214 Non-ST elevation (NSTEMI) myocardial infarction: Secondary | ICD-10-CM

## 2020-05-18 DIAGNOSIS — I25110 Atherosclerotic heart disease of native coronary artery with unstable angina pectoris: Secondary | ICD-10-CM

## 2020-05-18 HISTORY — PX: CLIPPING OF ATRIAL APPENDAGE: SHX5773

## 2020-05-18 HISTORY — DX: Presence of aortocoronary bypass graft: Z95.1

## 2020-05-18 HISTORY — PX: RADIAL ARTERY HARVEST: SHX5067

## 2020-05-18 HISTORY — PX: CORONARY ARTERY BYPASS GRAFT: SHX141

## 2020-05-18 HISTORY — PX: TEE WITHOUT CARDIOVERSION: SHX5443

## 2020-05-18 HISTORY — PX: MAZE: SHX5063

## 2020-05-18 HISTORY — DX: Chronic systolic (congestive) heart failure: I50.22

## 2020-05-18 LAB — POCT I-STAT 7, (LYTES, BLD GAS, ICA,H+H)
Acid-Base Excess: 1 mmol/L (ref 0.0–2.0)
Acid-Base Excess: 2 mmol/L (ref 0.0–2.0)
Acid-Base Excess: 2 mmol/L (ref 0.0–2.0)
Acid-Base Excess: 3 mmol/L — ABNORMAL HIGH (ref 0.0–2.0)
Acid-base deficit: 2 mmol/L (ref 0.0–2.0)
Acid-base deficit: 4 mmol/L — ABNORMAL HIGH (ref 0.0–2.0)
Bicarbonate: 26 mmol/L (ref 20.0–28.0)
Bicarbonate: 26.8 mmol/L (ref 20.0–28.0)
Bicarbonate: 26.4 mmol/L (ref 20.0–28.0)
Bicarbonate: 26.7 mmol/L (ref 20.0–28.0)
Bicarbonate: 22.7 mmol/L (ref 20.0–28.0)
Bicarbonate: 22.1 mmol/L (ref 20.0–28.0)
Calcium, Ion: 0.98 mmol/L — ABNORMAL LOW (ref 1.15–1.40)
Calcium, Ion: 1.05 mmol/L — ABNORMAL LOW (ref 1.15–1.40)
Calcium, Ion: 1.03 mmol/L — ABNORMAL LOW (ref 1.15–1.40)
Calcium, Ion: 1.46 mmol/L — ABNORMAL HIGH (ref 1.15–1.40)
Calcium, Ion: 1.21 mmol/L (ref 1.15–1.40)
Calcium, Ion: 1.15 mmol/L (ref 1.15–1.40)
HCT: 26 % — ABNORMAL LOW (ref 39.0–52.0)
HCT: 26 % — ABNORMAL LOW (ref 39.0–52.0)
HCT: 24 % — ABNORMAL LOW (ref 39.0–52.0)
HCT: 23 % — ABNORMAL LOW (ref 39.0–52.0)
HCT: 25 % — ABNORMAL LOW (ref 39.0–52.0)
HCT: 31 % — ABNORMAL LOW (ref 39.0–52.0)
Hemoglobin: 8.8 g/dL — ABNORMAL LOW (ref 13.0–17.0)
Hemoglobin: 8.8 g/dL — ABNORMAL LOW (ref 13.0–17.0)
Hemoglobin: 8.2 g/dL — ABNORMAL LOW (ref 13.0–17.0)
Hemoglobin: 7.8 g/dL — ABNORMAL LOW (ref 13.0–17.0)
Hemoglobin: 8.5 g/dL — ABNORMAL LOW (ref 13.0–17.0)
Hemoglobin: 10.5 g/dL — ABNORMAL LOW (ref 13.0–17.0)
O2 Saturation: 100 %
O2 Saturation: 86 %
O2 Saturation: 100 %
O2 Saturation: 100 %
O2 Saturation: 97 %
O2 Saturation: 97 %
Patient temperature: 36.7
Potassium: 3.4 mmol/L — ABNORMAL LOW (ref 3.5–5.1)
Potassium: 3.6 mmol/L (ref 3.5–5.1)
Potassium: 4.2 mmol/L (ref 3.5–5.1)
Potassium: 4.2 mmol/L (ref 3.5–5.1)
Potassium: 3.5 mmol/L (ref 3.5–5.1)
Potassium: 3.7 mmol/L (ref 3.5–5.1)
Sodium: 138 mmol/L (ref 135–145)
Sodium: 140 mmol/L (ref 135–145)
Sodium: 139 mmol/L (ref 135–145)
Sodium: 136 mmol/L (ref 135–145)
Sodium: 140 mmol/L (ref 135–145)
Sodium: 140 mmol/L (ref 135–145)
TCO2: 27 mmol/L (ref 22–32)
TCO2: 28 mmol/L (ref 22–32)
TCO2: 27 mmol/L (ref 22–32)
TCO2: 28 mmol/L (ref 22–32)
TCO2: 24 mmol/L (ref 22–32)
TCO2: 23 mmol/L (ref 22–32)
pCO2 arterial: 42.6 mmHg (ref 32.0–48.0)
pCO2 arterial: 43.6 mmHg (ref 32.0–48.0)
pCO2 arterial: 36.7 mmHg (ref 32.0–48.0)
pCO2 arterial: 33.5 mmHg (ref 32.0–48.0)
pCO2 arterial: 39.5 mmHg (ref 32.0–48.0)
pCO2 arterial: 41.5 mmHg (ref 32.0–48.0)
pH, Arterial: 7.393 (ref 7.350–7.450)
pH, Arterial: 7.397 (ref 7.350–7.450)
pH, Arterial: 7.465 — ABNORMAL HIGH (ref 7.350–7.450)
pH, Arterial: 7.509 — ABNORMAL HIGH (ref 7.350–7.450)
pH, Arterial: 7.368 (ref 7.350–7.450)
pH, Arterial: 7.332 — ABNORMAL LOW (ref 7.350–7.450)
pO2, Arterial: 354 mmHg — ABNORMAL HIGH (ref 83.0–108.0)
pO2, Arterial: 52 mmHg — ABNORMAL LOW (ref 83.0–108.0)
pO2, Arterial: 306 mmHg — ABNORMAL HIGH (ref 83.0–108.0)
pO2, Arterial: 388 mmHg — ABNORMAL HIGH (ref 83.0–108.0)
pO2, Arterial: 90 mmHg (ref 83.0–108.0)
pO2, Arterial: 92 mmHg (ref 83.0–108.0)

## 2020-05-18 LAB — COMPREHENSIVE METABOLIC PANEL
ALT: 25 U/L (ref 0–44)
AST: 57 U/L — ABNORMAL HIGH (ref 15–41)
Albumin: 3.3 g/dL — ABNORMAL LOW (ref 3.5–5.0)
Alkaline Phosphatase: 28 U/L — ABNORMAL LOW (ref 38–126)
Anion gap: 12 (ref 5–15)
BUN: 15 mg/dL (ref 8–23)
CO2: 17 mmol/L — ABNORMAL LOW (ref 22–32)
Calcium: 7.7 mg/dL — ABNORMAL LOW (ref 8.9–10.3)
Chloride: 107 mmol/L (ref 98–111)
Creatinine, Ser: 1.15 mg/dL (ref 0.61–1.24)
GFR calc Af Amer: >60 mL/min (ref >60)
GFR calc non Af Amer: >60 mL/min (ref >60)
Glucose, Bld: 169 mg/dL — ABNORMAL HIGH (ref 70–99)
Potassium: 3.1 mmol/L — ABNORMAL LOW (ref 3.5–5.1)
Sodium: 136 mmol/L (ref 135–145)
Total Bilirubin: 1.7 mg/dL — ABNORMAL HIGH (ref 0.3–1.2)
Total Protein: 5.2 g/dL — ABNORMAL LOW (ref 6.5–8.1)

## 2020-05-18 LAB — CBC
HCT: 33.1 % — ABNORMAL LOW (ref 39.0–52.0)
HCT: 32 % — ABNORMAL LOW (ref 39.0–52.0)
Hemoglobin: 11 g/dL — ABNORMAL LOW (ref 13.0–17.0)
Hemoglobin: 10.5 g/dL — ABNORMAL LOW (ref 13.0–17.0)
MCH: 29.6 pg (ref 26.0–34.0)
MCH: 28.8 pg (ref 26.0–34.0)
MCHC: 33.2 g/dL (ref 30.0–36.0)
MCHC: 32.8 g/dL (ref 30.0–36.0)
MCV: 89.2 fL (ref 80.0–100.0)
MCV: 87.9 fL (ref 80.0–100.0)
Platelets: 165 10*3/uL (ref 150–400)
Platelets: 138 10*3/uL — ABNORMAL LOW (ref 150–400)
RBC: 3.71 MIL/uL — ABNORMAL LOW (ref 4.22–5.81)
RBC: 3.64 MIL/uL — ABNORMAL LOW (ref 4.22–5.81)
RDW: 13.5 % (ref 11.5–15.5)
RDW: 13.6 % (ref 11.5–15.5)
WBC: 5.9 10*3/uL (ref 4.0–10.5)
WBC: 11 10*3/uL — ABNORMAL HIGH (ref 4.0–10.5)
nRBC: 0 % (ref 0.0–0.2)
nRBC: 0 % (ref 0.0–0.2)

## 2020-05-18 LAB — CBC WITH DIFFERENTIAL/PLATELET
Abs Immature Granulocytes: 0.04 10*3/uL (ref 0.00–0.07)
Basophils Absolute: 0 10*3/uL (ref 0.0–0.1)
Basophils Relative: 0 %
Eosinophils Absolute: 0 10*3/uL (ref 0.0–0.5)
Eosinophils Relative: 0 %
HCT: 31 % — ABNORMAL LOW (ref 39.0–52.0)
Hemoglobin: 10.1 g/dL — ABNORMAL LOW (ref 13.0–17.0)
Immature Granulocytes: 0 %
Lymphocytes Relative: 5 %
Lymphs Abs: 0.6 10*3/uL — ABNORMAL LOW (ref 0.7–4.0)
MCH: 29.1 pg (ref 26.0–34.0)
MCHC: 32.6 g/dL (ref 30.0–36.0)
MCV: 89.3 fL (ref 80.0–100.0)
Monocytes Absolute: 0.7 10*3/uL (ref 0.1–1.0)
Monocytes Relative: 7 %
Neutro Abs: 9.6 10*3/uL — ABNORMAL HIGH (ref 1.7–7.7)
Neutrophils Relative %: 88 %
Platelets: 142 10*3/uL — ABNORMAL LOW (ref 150–400)
RBC: 3.47 MIL/uL — ABNORMAL LOW (ref 4.22–5.81)
RDW: 13.9 % (ref 11.5–15.5)
WBC: 11 10*3/uL — ABNORMAL HIGH (ref 4.0–10.5)
nRBC: 0 % (ref 0.0–0.2)

## 2020-05-18 LAB — POCT I-STAT, CHEM 8
BUN: 16 mg/dL (ref 8–23)
BUN: 18 mg/dL (ref 8–23)
BUN: 17 mg/dL (ref 8–23)
BUN: 16 mg/dL (ref 8–23)
BUN: 16 mg/dL (ref 8–23)
Calcium, Ion: 1.12 mmol/L — ABNORMAL LOW (ref 1.15–1.40)
Calcium, Ion: 1.15 mmol/L (ref 1.15–1.40)
Calcium, Ion: 1.06 mmol/L — ABNORMAL LOW (ref 1.15–1.40)
Calcium, Ion: 1.01 mmol/L — ABNORMAL LOW (ref 1.15–1.40)
Calcium, Ion: 1.22 mmol/L (ref 1.15–1.40)
Chloride: 100 mmol/L (ref 98–111)
Chloride: 98 mmol/L (ref 98–111)
Chloride: 99 mmol/L (ref 98–111)
Chloride: 98 mmol/L (ref 98–111)
Chloride: 102 mmol/L (ref 98–111)
Creatinine, Ser: 0.9 mg/dL (ref 0.61–1.24)
Creatinine, Ser: 0.9 mg/dL (ref 0.61–1.24)
Creatinine, Ser: 0.8 mg/dL (ref 0.61–1.24)
Creatinine, Ser: 0.7 mg/dL (ref 0.61–1.24)
Creatinine, Ser: 0.8 mg/dL (ref 0.61–1.24)
Glucose, Bld: 123 mg/dL — ABNORMAL HIGH (ref 70–99)
Glucose, Bld: 135 mg/dL — ABNORMAL HIGH (ref 70–99)
Glucose, Bld: 150 mg/dL — ABNORMAL HIGH (ref 70–99)
Glucose, Bld: 150 mg/dL — ABNORMAL HIGH (ref 70–99)
Glucose, Bld: 163 mg/dL — ABNORMAL HIGH (ref 70–99)
HCT: 32 % — ABNORMAL LOW (ref 39.0–52.0)
HCT: 30 % — ABNORMAL LOW (ref 39.0–52.0)
HCT: 26 % — ABNORMAL LOW (ref 39.0–52.0)
HCT: 22 % — ABNORMAL LOW (ref 39.0–52.0)
HCT: 27 % — ABNORMAL LOW (ref 39.0–52.0)
Hemoglobin: 10.9 g/dL — ABNORMAL LOW (ref 13.0–17.0)
Hemoglobin: 10.2 g/dL — ABNORMAL LOW (ref 13.0–17.0)
Hemoglobin: 8.8 g/dL — ABNORMAL LOW (ref 13.0–17.0)
Hemoglobin: 7.5 g/dL — ABNORMAL LOW (ref 13.0–17.0)
Hemoglobin: 9.2 g/dL — ABNORMAL LOW (ref 13.0–17.0)
Potassium: 3.4 mmol/L — ABNORMAL LOW (ref 3.5–5.1)
Potassium: 3.5 mmol/L (ref 3.5–5.1)
Potassium: 3.7 mmol/L (ref 3.5–5.1)
Potassium: 4.2 mmol/L (ref 3.5–5.1)
Potassium: 3.5 mmol/L (ref 3.5–5.1)
Sodium: 138 mmol/L (ref 135–145)
Sodium: 137 mmol/L (ref 135–145)
Sodium: 138 mmol/L (ref 135–145)
Sodium: 136 mmol/L (ref 135–145)
Sodium: 139 mmol/L (ref 135–145)
TCO2: 24 mmol/L (ref 22–32)
TCO2: 24 mmol/L (ref 22–32)
TCO2: 26 mmol/L (ref 22–32)
TCO2: 25 mmol/L (ref 22–32)
TCO2: 21 mmol/L — ABNORMAL LOW (ref 22–32)

## 2020-05-18 LAB — HEMOGLOBIN A1C
Hgb A1c MFr Bld: 5.3 % (ref 4.8–5.6)
Mean Plasma Glucose: 105.41 mg/dL

## 2020-05-18 LAB — COOXEMETRY PANEL
Carboxyhemoglobin: 1 % (ref 0.5–1.5)
Methemoglobin: 0.8 % (ref 0.0–1.5)
O2 Saturation: 55.9 %
Total hemoglobin: 13.4 g/dL (ref 12.0–16.0)

## 2020-05-18 LAB — URINALYSIS, ROUTINE W REFLEX MICROSCOPIC
Bilirubin Urine: NEGATIVE
Glucose, UA: NEGATIVE mg/dL
Ketones, ur: NEGATIVE mg/dL
Leukocytes,Ua: NEGATIVE
Nitrite: NEGATIVE
Protein, ur: NEGATIVE mg/dL
Specific Gravity, Urine: 1.01 (ref 1.005–1.030)
pH: 5 (ref 5.0–8.0)

## 2020-05-18 LAB — PROTIME-INR
INR: 1.9 — ABNORMAL HIGH (ref 0.8–1.2)
Prothrombin Time: 21.1 seconds — ABNORMAL HIGH (ref 11.4–15.2)

## 2020-05-18 LAB — GLUCOSE, CAPILLARY
Glucose-Capillary: 142 mg/dL — ABNORMAL HIGH (ref 70–99)
Glucose-Capillary: 143 mg/dL — ABNORMAL HIGH (ref 70–99)
Glucose-Capillary: 143 mg/dL — ABNORMAL HIGH (ref 70–99)
Glucose-Capillary: 151 mg/dL — ABNORMAL HIGH (ref 70–99)
Glucose-Capillary: 142 mg/dL — ABNORMAL HIGH (ref 70–99)
Glucose-Capillary: 149 mg/dL — ABNORMAL HIGH (ref 70–99)
Glucose-Capillary: 173 mg/dL — ABNORMAL HIGH (ref 70–99)
Glucose-Capillary: 177 mg/dL — ABNORMAL HIGH (ref 70–99)
Glucose-Capillary: 166 mg/dL — ABNORMAL HIGH (ref 70–99)

## 2020-05-18 LAB — BASIC METABOLIC PANEL
Anion gap: 11 (ref 5–15)
BUN: 16 mg/dL (ref 8–23)
CO2: 27 mmol/L (ref 22–32)
Calcium: 8.1 mg/dL — ABNORMAL LOW (ref 8.9–10.3)
Chloride: 99 mmol/L (ref 98–111)
Creatinine, Ser: 1.05 mg/dL (ref 0.61–1.24)
GFR calc Af Amer: >60 mL/min (ref >60)
GFR calc non Af Amer: >60 mL/min (ref >60)
Glucose, Bld: 129 mg/dL — ABNORMAL HIGH (ref 70–99)
Potassium: 3.1 mmol/L — ABNORMAL LOW (ref 3.5–5.1)
Sodium: 137 mmol/L (ref 135–145)

## 2020-05-18 LAB — MAGNESIUM: Magnesium: 2.7 mg/dL — ABNORMAL HIGH (ref 1.7–2.4)

## 2020-05-18 LAB — PLATELET COUNT: Platelets: 95 10*3/uL — ABNORMAL LOW (ref 150–400)

## 2020-05-18 LAB — PREPARE RBC (CROSSMATCH)

## 2020-05-18 LAB — HEPARIN LEVEL (UNFRACTIONATED): Heparin Unfractionated: <0.1 IU/mL — ABNORMAL LOW (ref 0.30–0.70)

## 2020-05-18 LAB — PHOSPHORUS: Phosphorus: 1.3 mg/dL — ABNORMAL LOW (ref 2.5–4.6)

## 2020-05-18 LAB — APTT: aPTT: 34 seconds (ref 24–36)

## 2020-05-18 SURGERY — CORONARY ARTERY BYPASS GRAFTING (CABG)
Anesthesia: General | Site: Chest

## 2020-05-18 MED ORDER — SODIUM CHLORIDE 0.9 % IV SOLN: 250.0000 mL | INTRAVENOUS | Status: DC | PRN

## 2020-05-18 MED ORDER — MORPHINE SULFATE (PF) 2 MG/ML IV SOLN
1.0000 mg | INTRAVENOUS | Status: DC | PRN
  Administered 2020-05-18: 2 mg via INTRAVENOUS
  Administered 2020-05-18: 4 mg via INTRAVENOUS
  Administered 2020-05-18: 2 mg via INTRAVENOUS
  Administered 2020-05-19 (×2): 4 mg via INTRAVENOUS
  Administered 2020-05-19: 2 mg via INTRAVENOUS
  Administered 2020-05-19: 4 mg via INTRAVENOUS
  Administered 2020-05-19: 2 mg via INTRAVENOUS
  Administered 2020-05-19 – 2020-05-20 (×6): 4 mg via INTRAVENOUS
  Administered 2020-05-20: 2 mg via INTRAVENOUS
  Administered 2020-05-20 (×7): 4 mg via INTRAVENOUS
  Administered 2020-05-20 – 2020-05-22 (×7): 2 mg via INTRAVENOUS
  Administered 2020-05-23 – 2020-05-25 (×4): 4 mg via INTRAVENOUS
  Filled 2020-05-18: qty 2
  Filled 2020-05-18: qty 1
  Filled 2020-05-18: qty 2
  Filled 2020-05-18: qty 1
  Filled 2020-05-18 (×3): qty 2
  Filled 2020-05-18 (×3): qty 1
  Filled 2020-05-18: qty 2
  Filled 2020-05-18: qty 1
  Filled 2020-05-18 (×6): qty 2
  Filled 2020-05-18: qty 1
  Filled 2020-05-18 (×4): qty 2
  Filled 2020-05-18: qty 1
  Filled 2020-05-18 (×2): qty 2
  Filled 2020-05-18: qty 1
  Filled 2020-05-18 (×6): qty 2

## 2020-05-18 MED ORDER — CHLORHEXIDINE GLUCONATE 0.12% ORAL RINSE (MEDLINE KIT)
15.0000 mL | Freq: Two times a day (BID) | OROMUCOSAL | Status: DC
  Administered 2020-05-18 – 2020-05-22 (×8): 15 mL via OROMUCOSAL

## 2020-05-18 MED ORDER — STERILE WATER FOR INJECTION IJ SOLN
INTRAMUSCULAR | Status: AC
  Filled 2020-05-18: qty 10

## 2020-05-18 MED ORDER — STERILE WATER FOR INJECTION IJ SOLN
INTRAMUSCULAR | Status: DC | PRN
  Administered 2020-05-18: 10 mL via INTRAVASCULAR

## 2020-05-18 MED ORDER — SODIUM CHLORIDE 0.9% FLUSH
3.0000 mL | Freq: Two times a day (BID) | INTRAVENOUS | Status: DC
  Administered 2020-05-19 – 2020-05-20 (×3): 3 mL via INTRAVENOUS
  Administered 2020-05-21: 10 mL via INTRAVENOUS
  Administered 2020-05-21 – 2020-05-28 (×13): 3 mL via INTRAVENOUS

## 2020-05-18 MED ORDER — LACTATED RINGERS IV SOLN: INTRAVENOUS | Status: DC

## 2020-05-18 MED ORDER — ACETAMINOPHEN 500 MG PO TABS
1000.0000 mg | ORAL_TABLET | Freq: Four times a day (QID) | ORAL | Status: AC
  Administered 2020-05-22: 1000 mg via ORAL
  Filled 2020-05-18: qty 2

## 2020-05-18 MED ORDER — ONDANSETRON HCL 4 MG/2ML IJ SOLN
4.0000 mg | Freq: Four times a day (QID) | INTRAMUSCULAR | Status: DC | PRN
  Administered 2020-05-19 – 2020-05-25 (×7): 4 mg via INTRAVENOUS
  Filled 2020-05-18 (×7): qty 2

## 2020-05-18 MED ORDER — TRANEXAMIC ACID (OHS) BOLUS VIA INFUSION
15.0000 mg/kg | INTRAVENOUS | Status: AC
  Administered 2020-05-18: 1633.5 mg via INTRAVENOUS
  Filled 2020-05-18: qty 1634

## 2020-05-18 MED ORDER — MILRINONE LACTATE IN DEXTROSE 20-5 MG/100ML-% IV SOLN
0.5000 ug/kg/min | INTRAVENOUS | Status: DC
  Administered 2020-05-19 (×2): 0.5 ug/kg/min via INTRAVENOUS
  Administered 2020-05-19: 0.375 ug/kg/min via INTRAVENOUS
  Administered 2020-05-19: 0.5 ug/kg/min via INTRAVENOUS
  Administered 2020-05-20: 0.375 ug/kg/min via INTRAVENOUS
  Administered 2020-05-20 – 2020-05-22 (×8): 0.5 ug/kg/min via INTRAVENOUS
  Filled 2020-05-18 (×6): qty 100
  Filled 2020-05-18: qty 200
  Filled 2020-05-18 (×5): qty 100

## 2020-05-18 MED ORDER — SODIUM CHLORIDE 0.9 % IV SOLN
INTRAVENOUS | Status: DC | PRN
  Administered 2020-05-18: 750 mg via INTRAVENOUS

## 2020-05-18 MED ORDER — VASOPRESSIN 20 UNITS/100 ML INFUSION FOR SHOCK
0.0000 [IU]/min | INTRAVENOUS | Status: DC
  Administered 2020-05-18 – 2020-05-19 (×2): 0.04 [IU]/min via INTRAVENOUS
  Filled 2020-05-18 (×2): qty 100

## 2020-05-18 MED ORDER — LACTATED RINGERS IV SOLN: INTRAVENOUS | Status: DC | PRN

## 2020-05-18 MED ORDER — INSULIN REGULAR(HUMAN) IN NACL 100-0.9 UT/100ML-% IV SOLN
INTRAVENOUS | Status: DC
  Administered 2020-05-19: 1.1 [IU]/h via INTRAVENOUS
  Filled 2020-05-18: qty 100

## 2020-05-18 MED ORDER — PROPOFOL 10 MG/ML IV BOLUS
INTRAVENOUS | Status: AC
  Filled 2020-05-18: qty 20

## 2020-05-18 MED ORDER — NOREPINEPHRINE 4 MG/250ML-% IV SOLN: 0.0000 ug/min | INTRAVENOUS | Status: DC

## 2020-05-18 MED ORDER — ROCURONIUM BROMIDE 10 MG/ML (PF) SYRINGE
PREFILLED_SYRINGE | INTRAVENOUS | Status: AC
  Filled 2020-05-18: qty 10

## 2020-05-18 MED ORDER — EPHEDRINE 5 MG/ML INJ
INTRAVENOUS | Status: AC
  Filled 2020-05-18: qty 30

## 2020-05-18 MED ORDER — SODIUM CHLORIDE 0.9 % IV SOLN
INTRAVENOUS | Status: DC
  Filled 2020-05-18: qty 30

## 2020-05-18 MED ORDER — BUPIVACAINE LIPOSOME 1.3 % IJ SUSP
INTRAMUSCULAR | Status: DC | PRN
  Administered 2020-05-18: 50 mL

## 2020-05-18 MED ORDER — PLASMA-LYTE 148 IV SOLN
INTRAVENOUS | Status: DC
  Filled 2020-05-18: qty 2.5

## 2020-05-18 MED ORDER — DOCUSATE SODIUM 100 MG PO CAPS: 200.0000 mg | ORAL_CAPSULE | Freq: Every day | ORAL | Status: DC

## 2020-05-18 MED ORDER — MAGNESIUM SULFATE 50 % IJ SOLN
40.0000 meq | INTRAMUSCULAR | Status: DC
  Filled 2020-05-18: qty 9.85

## 2020-05-18 MED ORDER — CHLORHEXIDINE GLUCONATE CLOTH 2 % EX PADS: 6.0000 | MEDICATED_PAD | Freq: Every day | CUTANEOUS | Status: DC

## 2020-05-18 MED ORDER — SODIUM CHLORIDE (PF) 0.9 % IJ SOLN
INTRAMUSCULAR | Status: AC
  Filled 2020-05-18: qty 40

## 2020-05-18 MED ORDER — ETOMIDATE 2 MG/ML IV SOLN
INTRAVENOUS | Status: DC | PRN
  Administered 2020-05-18: 100 mg via INTRAVENOUS

## 2020-05-18 MED ORDER — FAMOTIDINE IN NACL 20-0.9 MG/50ML-% IV SOLN
20.0000 mg | Freq: Two times a day (BID) | INTRAVENOUS | Status: AC
  Administered 2020-05-18 – 2020-05-19 (×2): 20 mg via INTRAVENOUS
  Filled 2020-05-18: qty 50

## 2020-05-18 MED ORDER — POTASSIUM CHLORIDE 10 MEQ/50ML IV SOLN
10.0000 meq | INTRAVENOUS | Status: AC
  Administered 2020-05-18 (×3): 10 meq via INTRAVENOUS
  Filled 2020-05-18: qty 50

## 2020-05-18 MED ORDER — SODIUM CHLORIDE 0.45 % IV SOLN: INTRAVENOUS | Status: DC | PRN

## 2020-05-18 MED ORDER — ALBUMIN HUMAN 5 % IV SOLN
250.0000 mL | INTRAVENOUS | Status: AC | PRN
  Administered 2020-05-18 (×4): 12.5 g via INTRAVENOUS
  Filled 2020-05-18 (×3): qty 250

## 2020-05-18 MED ORDER — OXYCODONE HCL 5 MG PO TABS: 5.0000 mg | ORAL_TABLET | ORAL | Status: DC | PRN

## 2020-05-18 MED ORDER — PHENYLEPHRINE HCL-NACL 20-0.9 MG/250ML-% IV SOLN
30.0000 ug/min | INTRAVENOUS | Status: AC
  Administered 2020-05-18: 50 ug/min via INTRAVENOUS

## 2020-05-18 MED ORDER — PROTAMINE SULFATE 10 MG/ML IV SOLN
INTRAVENOUS | Status: AC
  Filled 2020-05-18: qty 25

## 2020-05-18 MED ORDER — MILRINONE LACTATE IN DEXTROSE 20-5 MG/100ML-% IV SOLN: 0.3000 ug/kg/min | INTRAVENOUS | Status: DC

## 2020-05-18 MED ORDER — PANTOPRAZOLE SODIUM 40 MG PO TBEC: 40.0000 mg | DELAYED_RELEASE_TABLET | Freq: Every day | ORAL | Status: DC

## 2020-05-18 MED ORDER — ETOMIDATE 2 MG/ML IV SOLN
INTRAVENOUS | Status: AC
  Filled 2020-05-18: qty 20

## 2020-05-18 MED ORDER — PROTAMINE SULFATE 10 MG/ML IV SOLN
INTRAVENOUS | Status: AC
  Filled 2020-05-18: qty 5

## 2020-05-18 MED ORDER — LACTATED RINGERS IV SOLN
500.0000 mL | Freq: Once | INTRAVENOUS | Status: AC | PRN
  Administered 2020-05-18: 500 mL via INTRAVENOUS

## 2020-05-18 MED ORDER — SODIUM CHLORIDE 0.9% FLUSH: 3.0000 mL | INTRAVENOUS | Status: DC | PRN

## 2020-05-18 MED ORDER — FENTANYL CITRATE (PF) 250 MCG/5ML IJ SOLN
INTRAMUSCULAR | Status: AC
  Filled 2020-05-18: qty 25

## 2020-05-18 MED ORDER — PHENYLEPHRINE HCL-NACL 20-0.9 MG/250ML-% IV SOLN
0.0000 ug/min | INTRAVENOUS | Status: DC
  Filled 2020-05-18: qty 250

## 2020-05-18 MED ORDER — DEXMEDETOMIDINE HCL IN NACL 400 MCG/100ML IV SOLN
0.4000 ug/kg/h | INTRAVENOUS | Status: DC
  Administered 2020-05-18 (×2): 0.7 ug/kg/h via INTRAVENOUS
  Administered 2020-05-19 (×3): 1.2 ug/kg/h via INTRAVENOUS
  Administered 2020-05-19: 0.9 ug/kg/h via INTRAVENOUS
  Administered 2020-05-19: 0.7 ug/kg/h via INTRAVENOUS
  Administered 2020-05-19 – 2020-05-20 (×7): 1.2 ug/kg/h via INTRAVENOUS
  Administered 2020-05-21: 0.7 ug/kg/h via INTRAVENOUS
  Administered 2020-05-21 (×3): 1.2 ug/kg/h via INTRAVENOUS
  Administered 2020-05-22: 0.7 ug/kg/h via INTRAVENOUS
  Filled 2020-05-18 (×4): qty 100
  Filled 2020-05-18 (×2): qty 200
  Filled 2020-05-18 (×2): qty 100
  Filled 2020-05-18: qty 200
  Filled 2020-05-18 (×8): qty 100
  Filled 2020-05-18: qty 200
  Filled 2020-05-18: qty 100

## 2020-05-18 MED ORDER — BUPIVACAINE LIPOSOME 1.3 % IJ SUSP
20.0000 mL | Freq: Once | INTRAMUSCULAR | Status: DC
  Filled 2020-05-18: qty 20

## 2020-05-18 MED ORDER — CHLORHEXIDINE GLUCONATE 0.12 % MT SOLN: 15.0000 mL | Freq: Once | OROMUCOSAL | Status: DC

## 2020-05-18 MED ORDER — TRANEXAMIC ACID (OHS) PUMP PRIME SOLUTION
2.0000 mg/kg | INTRAVENOUS | Status: DC
  Filled 2020-05-18: qty 2.18

## 2020-05-18 MED ORDER — SODIUM CHLORIDE 0.9 % IV SOLN: INTRAVENOUS | Status: DC

## 2020-05-18 MED ORDER — EPINEPHRINE HCL 5 MG/250ML IV SOLN IN NS
INTRAVENOUS | Status: DC | PRN
  Administered 2020-05-18: 5 ug/min via INTRAVENOUS

## 2020-05-18 MED ORDER — METOPROLOL TARTRATE 5 MG/5ML IV SOLN
2.5000 mg | INTRAVENOUS | Status: DC | PRN
  Filled 2020-05-18: qty 5

## 2020-05-18 MED ORDER — SODIUM CHLORIDE 0.9% FLUSH
3.0000 mL | INTRAVENOUS | Status: DC | PRN
  Administered 2020-05-25: 3 mL via INTRAVENOUS

## 2020-05-18 MED ORDER — PLATELET RICH PLASMA OPTIME
Status: DC | PRN
  Administered 2020-05-18: 10 mL

## 2020-05-18 MED ORDER — MIDAZOLAM HCL 5 MG/5ML IJ SOLN
INTRAMUSCULAR | Status: DC | PRN
  Administered 2020-05-18 (×5): 2 mg via INTRAVENOUS
  Administered 2020-05-18: 4 mg via INTRAVENOUS

## 2020-05-18 MED ORDER — PLASMA-LYTE 148 IV SOLN
INTRAVENOUS | Status: DC | PRN
  Administered 2020-05-18: 500 mL via INTRAVASCULAR

## 2020-05-18 MED ORDER — VANCOMYCIN HCL 1000 MG IV SOLR
INTRAVENOUS | Status: DC | PRN
  Administered 2020-05-18: 3000 mg via TOPICAL

## 2020-05-18 MED ORDER — VASOPRESSIN 20 UNIT/ML IV SOLN
INTRAVENOUS | Status: AC
  Filled 2020-05-18: qty 1

## 2020-05-18 MED ORDER — PROTAMINE SULFATE 10 MG/ML IV SOLN
INTRAVENOUS | Status: DC | PRN
  Administered 2020-05-18: 300 mg via INTRAVENOUS

## 2020-05-18 MED ORDER — FENTANYL CITRATE (PF) 250 MCG/5ML IJ SOLN
INTRAMUSCULAR | Status: DC | PRN
Start: 2020-05-18 — End: 2020-05-18
  Administered 2020-05-18: 50 ug via INTRAVENOUS
  Administered 2020-05-18: 400 ug via INTRAVENOUS
  Administered 2020-05-18 (×2): 50 ug via INTRAVENOUS

## 2020-05-18 MED ORDER — SODIUM CHLORIDE 0.9 % IV SOLN
INTRAVENOUS | Status: DC | PRN
  Administered 2020-05-18: 1.5 g via INTRAVENOUS

## 2020-05-18 MED ORDER — EPINEPHRINE PF 1 MG/ML IJ SOLN
INTRAMUSCULAR | Status: DC | PRN
  Administered 2020-05-18: .01 mg via INTRAVENOUS
  Administered 2020-05-18: .005 mg via INTRAVENOUS
  Administered 2020-05-18: .01 mg via INTRAVENOUS

## 2020-05-18 MED ORDER — NITROGLYCERIN IN D5W 200-5 MCG/ML-% IV SOLN
7.0000 ug/min | INTRAVENOUS | Status: DC
  Administered 2020-05-22: 10 ug/min via INTRAVENOUS
  Filled 2020-05-18: qty 250

## 2020-05-18 MED ORDER — CHLORHEXIDINE GLUCONATE 0.12 % MT SOLN
15.0000 mL | OROMUCOSAL | Status: AC
  Administered 2020-05-18: 15 mL via OROMUCOSAL

## 2020-05-18 MED ORDER — HEPARIN SODIUM (PORCINE) 1000 UNIT/ML IJ SOLN
INTRAMUSCULAR | Status: DC | PRN
  Administered 2020-05-18: 3000 [IU] via INTRAVENOUS
  Administered 2020-05-18: 30000 [IU] via INTRAVENOUS

## 2020-05-18 MED ORDER — METOPROLOL TARTRATE 25 MG/10 ML ORAL SUSPENSION: 12.5000 mg | Freq: Two times a day (BID) | ORAL | Status: DC

## 2020-05-18 MED ORDER — CHLORHEXIDINE GLUCONATE CLOTH 2 % EX PADS: 6.0000 | MEDICATED_PAD | Freq: Once | CUTANEOUS | Status: DC

## 2020-05-18 MED ORDER — SODIUM CHLORIDE 0.9 % IV SOLN: INTRAVENOUS | Status: DC | PRN

## 2020-05-18 MED ORDER — DEXTROSE 50 % IV SOLN: 0.0000 mL | INTRAVENOUS | Status: DC | PRN

## 2020-05-18 MED ORDER — SODIUM CHLORIDE 0.9 % IV SOLN
1.5000 g | Freq: Two times a day (BID) | INTRAVENOUS | Status: AC
  Administered 2020-05-18 – 2020-05-20 (×4): 1.5 g via INTRAVENOUS
  Filled 2020-05-18 (×4): qty 1.5

## 2020-05-18 MED ORDER — ACETAMINOPHEN 160 MG/5ML PO SOLN
1000.0000 mg | Freq: Four times a day (QID) | ORAL | Status: AC
  Administered 2020-05-19 – 2020-05-22 (×11): 1000 mg
  Filled 2020-05-18 (×10): qty 40.6

## 2020-05-18 MED ORDER — VASOPRESSIN 20 UNITS/100 ML INFUSION FOR SHOCK
INTRAVENOUS | Status: DC | PRN
  Administered 2020-05-18: .04 [IU]/min via INTRAVENOUS

## 2020-05-18 MED ORDER — NOREPINEPHRINE 4 MG/250ML-% IV SOLN
0.0000 ug/min | INTRAVENOUS | Status: DC
  Administered 2020-05-18 (×2): 18 ug/min via INTRAVENOUS
  Administered 2020-05-19: 10 ug/min via INTRAVENOUS
  Administered 2020-05-19: 9 ug/min via INTRAVENOUS
  Administered 2020-05-19: 15 ug/min via INTRAVENOUS
  Administered 2020-05-20: 10 ug/min via INTRAVENOUS
  Administered 2020-05-20 (×2): 7 ug/min via INTRAVENOUS
  Administered 2020-05-21: 10 ug/min via INTRAVENOUS
  Filled 2020-05-18 (×9): qty 250

## 2020-05-18 MED ORDER — PLATELET POOR PLASMA OPTIME
Status: DC | PRN
  Administered 2020-05-18: 10 mL

## 2020-05-18 MED ORDER — CHLORHEXIDINE GLUCONATE CLOTH 2 % EX PADS
6.0000 | MEDICATED_PAD | Freq: Every day | CUTANEOUS | Status: DC
  Administered 2020-05-19 – 2020-05-29 (×9): 6 via TOPICAL

## 2020-05-18 MED ORDER — EPINEPHRINE HCL 5 MG/250ML IV SOLN IN NS: 0.0000 ug/min | INTRAVENOUS | Status: DC

## 2020-05-18 MED ORDER — PHENYLEPHRINE 40 MCG/ML (10ML) SYRINGE FOR IV PUSH (FOR BLOOD PRESSURE SUPPORT)
PREFILLED_SYRINGE | INTRAVENOUS | Status: AC
  Filled 2020-05-18: qty 10

## 2020-05-18 MED ORDER — ASPIRIN EC 325 MG PO TBEC: 325.0000 mg | DELAYED_RELEASE_TABLET | Freq: Every day | ORAL | Status: DC

## 2020-05-18 MED ORDER — SODIUM CHLORIDE 0.9 % IV SOLN: 250.0000 mL | INTRAVENOUS | Status: DC

## 2020-05-18 MED ORDER — BISACODYL 10 MG RE SUPP
10.0000 mg | Freq: Every day | RECTAL | Status: DC
  Administered 2020-05-19 – 2020-05-21 (×3): 10 mg via RECTAL
  Filled 2020-05-18 (×4): qty 1

## 2020-05-18 MED ORDER — VANCOMYCIN HCL IN DEXTROSE 1-5 GM/200ML-% IV SOLN
1000.0000 mg | Freq: Once | INTRAVENOUS | Status: AC
  Administered 2020-05-18: 1000 mg via INTRAVENOUS
  Filled 2020-05-18: qty 200

## 2020-05-18 MED ORDER — POTASSIUM CHLORIDE 2 MEQ/ML IV SOLN
80.0000 meq | INTRAVENOUS | Status: DC
  Filled 2020-05-18: qty 40

## 2020-05-18 MED ORDER — PHENYLEPHRINE HCL (PRESSORS) 10 MG/ML IV SOLN
INTRAVENOUS | Status: AC
  Filled 2020-05-18: qty 2

## 2020-05-18 MED ORDER — TRANEXAMIC ACID 1000 MG/10ML IV SOLN
1.5000 mg/kg/h | INTRAVENOUS | Status: DC
  Filled 2020-05-18: qty 25

## 2020-05-18 MED ORDER — CHLORHEXIDINE GLUCONATE CLOTH 2 % EX PADS
6.0000 | MEDICATED_PAD | Freq: Once | CUTANEOUS | Status: AC
  Administered 2020-05-17 – 2020-05-18 (×2): 6 via TOPICAL

## 2020-05-18 MED ORDER — MIDAZOLAM HCL 2 MG/2ML IJ SOLN
INTRAMUSCULAR | Status: AC
  Filled 2020-05-18: qty 2

## 2020-05-18 MED ORDER — NITROGLYCERIN IN D5W 200-5 MCG/ML-% IV SOLN: 2.0000 ug/min | INTRAVENOUS | Status: DC

## 2020-05-18 MED ORDER — STERILE WATER FOR INJECTION IV SOLN
INTRAVENOUS | Status: DC | PRN
  Administered 2020-05-18: 30 mL

## 2020-05-18 MED ORDER — SODIUM CHLORIDE 0.9% FLUSH: 10.0000 mL | INTRAVENOUS | Status: DC | PRN

## 2020-05-18 MED ORDER — ORAL CARE MOUTH RINSE
15.0000 mL | OROMUCOSAL | Status: DC
  Administered 2020-05-18 – 2020-05-22 (×37): 15 mL via OROMUCOSAL

## 2020-05-18 MED ORDER — EPINEPHRINE HCL 5 MG/250ML IV SOLN IN NS
0.0000 ug/min | INTRAVENOUS | Status: DC
  Administered 2020-05-18: 7.5 ug/min via INTRAVENOUS
  Administered 2020-05-19: 7 ug/min via INTRAVENOUS
  Administered 2020-05-19: 7.5 ug/min via INTRAVENOUS
  Administered 2020-05-20: 7 ug/min via INTRAVENOUS
  Administered 2020-05-21 – 2020-05-22 (×2): 2 ug/min via INTRAVENOUS
  Filled 2020-05-18 (×6): qty 250

## 2020-05-18 MED ORDER — ALBUMIN HUMAN 5 % IV SOLN
250.0000 mL | INTRAVENOUS | Status: AC | PRN
  Administered 2020-05-18 – 2020-05-19 (×2): 12.5 g via INTRAVENOUS
  Filled 2020-05-18 (×4): qty 250

## 2020-05-18 MED ORDER — ASPIRIN 81 MG PO CHEW
324.0000 mg | CHEWABLE_TABLET | Freq: Every day | ORAL | Status: DC
  Administered 2020-05-19 – 2020-05-22 (×4): 324 mg
  Filled 2020-05-18 (×4): qty 4

## 2020-05-18 MED ORDER — ACETAMINOPHEN 160 MG/5ML PO SOLN: 650.0000 mg | Freq: Once | ORAL | Status: AC

## 2020-05-18 MED ORDER — BISACODYL 5 MG PO TBEC: 5.0000 mg | DELAYED_RELEASE_TABLET | Freq: Once | ORAL | Status: DC

## 2020-05-18 MED ORDER — VANCOMYCIN HCL 1500 MG/300ML IV SOLN
1500.0000 mg | INTRAVENOUS | Status: AC
  Administered 2020-05-18: 1500 mg via INTRAVENOUS
  Filled 2020-05-18: qty 300

## 2020-05-18 MED ORDER — 0.9 % SODIUM CHLORIDE (POUR BTL) OPTIME
TOPICAL | Status: DC | PRN
  Administered 2020-05-18: 6000 mL

## 2020-05-18 MED ORDER — INSULIN REGULAR(HUMAN) IN NACL 100-0.9 UT/100ML-% IV SOLN
INTRAVENOUS | Status: AC
  Administered 2020-05-18: 1.3 [IU]/h via INTRAVENOUS

## 2020-05-18 MED ORDER — BISACODYL 5 MG PO TBEC
10.0000 mg | DELAYED_RELEASE_TABLET | Freq: Every day | ORAL | Status: DC
  Administered 2020-05-22 – 2020-05-27 (×5): 10 mg via ORAL
  Filled 2020-05-18 (×7): qty 2

## 2020-05-18 MED ORDER — DEXMEDETOMIDINE HCL IN NACL 400 MCG/100ML IV SOLN
0.1000 ug/kg/h | INTRAVENOUS | Status: AC
  Administered 2020-05-18: .7 ug/kg/h via INTRAVENOUS

## 2020-05-18 MED ORDER — HEMOSTATIC AGENTS (NO CHARGE) OPTIME
TOPICAL | Status: DC | PRN
  Administered 2020-05-18 (×3): 1 via TOPICAL

## 2020-05-18 MED ORDER — ACETAMINOPHEN 650 MG RE SUPP
650.0000 mg | Freq: Once | RECTAL | Status: AC
  Administered 2020-05-18: 650 mg via RECTAL

## 2020-05-18 MED ORDER — ALBUMIN HUMAN 5 % IV SOLN: INTRAVENOUS | Status: DC | PRN

## 2020-05-18 MED ORDER — MIDAZOLAM HCL (PF) 10 MG/2ML IJ SOLN
INTRAMUSCULAR | Status: AC
  Filled 2020-05-18: qty 2

## 2020-05-18 MED ORDER — METOPROLOL TARTRATE 12.5 MG HALF TABLET: 12.5000 mg | ORAL_TABLET | Freq: Two times a day (BID) | ORAL | Status: DC

## 2020-05-18 MED ORDER — ROCURONIUM BROMIDE 10 MG/ML (PF) SYRINGE
PREFILLED_SYRINGE | INTRAVENOUS | Status: DC | PRN
  Administered 2020-05-18 (×3): 100 mg via INTRAVENOUS

## 2020-05-18 MED ORDER — POTASSIUM CHLORIDE 10 MEQ/50ML IV SOLN
10.0000 meq | INTRAVENOUS | Status: AC
  Administered 2020-05-18 (×4): 10 meq via INTRAVENOUS
  Filled 2020-05-18 (×2): qty 50

## 2020-05-18 MED ORDER — VANCOMYCIN HCL 1000 MG IV SOLR
INTRAVENOUS | Status: AC
  Filled 2020-05-18: qty 3000

## 2020-05-18 MED ORDER — MAGNESIUM SULFATE 4 GM/100ML IV SOLN
4.0000 g | Freq: Once | INTRAVENOUS | Status: AC
  Administered 2020-05-18: 4 g via INTRAVENOUS
  Filled 2020-05-18 (×2): qty 100

## 2020-05-18 MED ORDER — TRANEXAMIC ACID 1000 MG/10ML IV SOLN
1.5000 mg/kg/h | INTRAVENOUS | Status: AC
  Administered 2020-05-18: 1.5 mg/kg/h via INTRAVENOUS
  Filled 2020-05-18: qty 25

## 2020-05-18 MED ORDER — METOPROLOL TARTRATE 12.5 MG HALF TABLET
12.5000 mg | ORAL_TABLET | Freq: Once | ORAL | Status: AC
  Administered 2020-05-18: 12.5 mg via ORAL
  Filled 2020-05-18: qty 1

## 2020-05-18 MED ORDER — TRAMADOL HCL 50 MG PO TABS: 50.0000 mg | ORAL_TABLET | ORAL | Status: DC | PRN

## 2020-05-18 MED ORDER — MIDAZOLAM HCL 2 MG/2ML IJ SOLN
2.0000 mg | INTRAMUSCULAR | Status: DC | PRN
  Administered 2020-05-18 – 2020-05-21 (×36): 2 mg via INTRAVENOUS
  Filled 2020-05-18 (×36): qty 2

## 2020-05-18 MED ORDER — BUPIVACAINE HCL (PF) 0.5 % IJ SOLN
INTRAMUSCULAR | Status: AC
  Filled 2020-05-18: qty 30

## 2020-05-18 MED ORDER — POTASSIUM CHLORIDE 10 MEQ/50ML IV SOLN
10.0000 meq | INTRAVENOUS | Status: AC
  Administered 2020-05-18 – 2020-05-19 (×3): 10 meq via INTRAVENOUS
  Filled 2020-05-18 (×3): qty 50

## 2020-05-18 MED FILL — Potassium Chloride Inj 2 mEq/ML: INTRAVENOUS | Qty: 40 | Status: AC

## 2020-05-18 MED FILL — Heparin Sodium (Porcine) Inj 1000 Unit/ML: INTRAMUSCULAR | Qty: 30 | Status: AC

## 2020-05-18 MED FILL — Magnesium Sulfate Inj 50%: INTRAMUSCULAR | Qty: 20 | Status: AC

## 2020-05-18 SURGICAL SUPPLY — 115 items
ADAPTER CARDIO PERF ANTE/RETRO (ADAPTER) ×5 IMPLANT
ADH SKN CLS APL DERMABOND .7 (GAUZE/BANDAGES/DRESSINGS) ×8
ADPR PRFSN 84XANTGRD RTRGD (ADAPTER) ×4
ADPR TBG 2 MALE LL ART (MISCELLANEOUS) ×4
APPLIER CLIP 9.375 SM OPEN (CLIP) ×5
APR CLP SM 9.3 20 MLT OPN (CLIP) ×4
ATRICLIP EXCLUSION VLAA 45 (Miscellaneous) ×5 IMPLANT
BAG DECANTER FOR FLEXI CONT (MISCELLANEOUS) ×5 IMPLANT
BLADE CLIPPER SURG (BLADE) ×6 IMPLANT
BLADE STERNUM SYSTEM 6 (BLADE) ×5 IMPLANT
BLADE SURG 15 STRL LF DISP TIS (BLADE) ×4 IMPLANT
BLADE SURG 15 STRL SS (BLADE) ×5
BNDG ELASTIC 4X5.8 VLCR STR LF (GAUZE/BANDAGES/DRESSINGS) ×5 IMPLANT
BNDG GAUZE ELAST 4 BULKY (GAUZE/BANDAGES/DRESSINGS) ×5 IMPLANT
CANISTER SUCT 3000ML PPV (MISCELLANEOUS) ×5 IMPLANT
CANNULA NON VENT 22FR 12 (CANNULA) ×1 IMPLANT
CATH CPB KIT HENDRICKSON (MISCELLANEOUS) ×5 IMPLANT
CATH RETROPLEGIA CORONARY 14FR (CATHETERS) ×1 IMPLANT
CATH ROBINSON RED A/P 18FR (CATHETERS) ×12 IMPLANT
CLAMP ISOLATOR SYNERGY LG (MISCELLANEOUS) ×1 IMPLANT
CLIP APPLIE 9.375 SM OPEN (CLIP) ×4 IMPLANT
CLIP RETRACTION 3.0MM CORONARY (MISCELLANEOUS) ×5 IMPLANT
CONN ST 1/4X3/8  BEN (MISCELLANEOUS) ×15
CONN ST 1/4X3/8 BEN (MISCELLANEOUS) IMPLANT
COVER MAYO STAND STRL (DRAPES) ×5 IMPLANT
DERMABOND ADVANCED (GAUZE/BANDAGES/DRESSINGS) ×2
DERMABOND ADVANCED .7 DNX12 (GAUZE/BANDAGES/DRESSINGS) ×4 IMPLANT
DEVICE EXCLUSIN ATRCLP VLAA 45 (Miscellaneous) IMPLANT
DRAIN CHANNEL 28F RND 3/8 FF (WOUND CARE) ×16 IMPLANT
DRAPE CARDIOVASCULAR INCISE (DRAPES) ×5
DRAPE EXTREMITY T 121X128X90 (DISPOSABLE) ×5 IMPLANT
DRAPE HALF SHEET 40X57 (DRAPES) ×6 IMPLANT
DRAPE SLUSH/WARMER DISC (DRAPES) ×5 IMPLANT
DRAPE SRG 135X102X78XABS (DRAPES) ×4 IMPLANT
DRSG AQUACEL AG ADV 3.5X14 (GAUZE/BANDAGES/DRESSINGS) ×5 IMPLANT
ELECT CAUTERY BLADE 6.4 (BLADE) ×5 IMPLANT
ELECT REM PT RETURN 9FT ADLT (ELECTROSURGICAL) ×10
ELECTRODE REM PT RTRN 9FT ADLT (ELECTROSURGICAL) ×8 IMPLANT
FELT TEFLON 1X6 (MISCELLANEOUS) ×9 IMPLANT
GAUZE SPONGE 4X4 12PLY STRL (GAUZE/BANDAGES/DRESSINGS) ×10 IMPLANT
GAUZE SPONGE 4X4 12PLY STRL LF (GAUZE/BANDAGES/DRESSINGS) ×2 IMPLANT
GEL ULTRASOUND 20GR AQUASONIC (MISCELLANEOUS) ×4 IMPLANT
GLOVE BIO SURGEON STRL SZ 6.5 (GLOVE) ×4 IMPLANT
GLOVE BIO SURGEON STRL SZ7.5 (GLOVE) ×1 IMPLANT
GLOVE BIO SURGEON STRL SZ8 (GLOVE) ×1 IMPLANT
GLOVE BIOGEL PI IND STRL 6.5 (GLOVE) IMPLANT
GLOVE BIOGEL PI IND STRL 7.0 (GLOVE) IMPLANT
GLOVE BIOGEL PI IND STRL 8 (GLOVE) IMPLANT
GLOVE BIOGEL PI IND STRL 8.5 (GLOVE) IMPLANT
GLOVE BIOGEL PI INDICATOR 6.5 (GLOVE) ×3
GLOVE BIOGEL PI INDICATOR 7.0 (GLOVE) ×1
GLOVE BIOGEL PI INDICATOR 8 (GLOVE) ×1
GLOVE BIOGEL PI INDICATOR 8.5 (GLOVE) ×1
GLOVE NEODERM STRL 7.5  LF PF (GLOVE) ×12
GLOVE NEODERM STRL 7.5 LF PF (GLOVE) ×12 IMPLANT
GLOVE SURG NEODERM 7.5  LF PF (GLOVE) ×3
GLOVE SURG SYN 7.5  E (GLOVE) ×5
GLOVE SURG SYN 7.5 E (GLOVE) ×4 IMPLANT
GLOVE SURG SYN 7.5 PF PI (GLOVE) IMPLANT
GOWN STRL REUS W/ TWL LRG LVL3 (GOWN DISPOSABLE) ×16 IMPLANT
GOWN STRL REUS W/TWL LRG LVL3 (GOWN DISPOSABLE) ×45
INSERT FOGARTY XLG (MISCELLANEOUS) ×1 IMPLANT
INSERT SUTURE HOLDER (MISCELLANEOUS) ×5 IMPLANT
IV ADAPTER SYR DOUBLE MALE LL (MISCELLANEOUS) ×1 IMPLANT
KIT APPLICATOR RATIO 11:1 (KITS) ×1 IMPLANT
KIT BASIN OR (CUSTOM PROCEDURE TRAY) ×5 IMPLANT
KIT SUCTION CATH 14FR (SUCTIONS) ×5 IMPLANT
KIT TURNOVER KIT B (KITS) ×5 IMPLANT
NDL 18GX1X1/2 (RX/OR ONLY) (NEEDLE) ×4 IMPLANT
NEEDLE 18GX1X1/2 (RX/OR ONLY) (NEEDLE) ×5 IMPLANT
NS IRRIG 1000ML POUR BTL (IV SOLUTION) ×25 IMPLANT
PACK E OPEN HEART (SUTURE) ×5 IMPLANT
PACK OPEN HEART (CUSTOM PROCEDURE TRAY) ×5 IMPLANT
PACK PLATELET PROCEDURE 60 (MISCELLANEOUS) ×1 IMPLANT
PACK SPY-PHI (KITS) ×1 IMPLANT
PAD ARMBOARD 7.5X6 YLW CONV (MISCELLANEOUS) ×10 IMPLANT
PAD ELECT DEFIB RADIOL ZOLL (MISCELLANEOUS) ×5 IMPLANT
PENCIL BUTTON HOLSTER BLD 10FT (ELECTRODE) ×5 IMPLANT
POSITIONER HEAD DONUT 9IN (MISCELLANEOUS) ×5 IMPLANT
POWDER SURGICEL 3.0 GRAM (HEMOSTASIS) ×5 IMPLANT
PUNCH AORTIC ROTATE  4.5MM 8IN (MISCELLANEOUS) ×1 IMPLANT
SEALANT SURG COSEAL 4ML (VASCULAR PRODUCTS) ×5 IMPLANT
SET CARDIOPLEGIA MPS 5001102 (MISCELLANEOUS) ×1 IMPLANT
SHEARS HARMONIC 9CM CVD (BLADE) ×1 IMPLANT
SPONGE LAP 18X18 RF (DISPOSABLE) ×1 IMPLANT
STAPLER VISISTAT 35W (STAPLE) ×1 IMPLANT
SUPPORT HEART JANKE-BARRON (MISCELLANEOUS) ×5 IMPLANT
SUT BONE WAX W31G (SUTURE) ×5 IMPLANT
SUT MNCRL AB 3-0 PS2 18 (SUTURE) ×10 IMPLANT
SUT PDS AB 1 CTX 36 (SUTURE) ×10 IMPLANT
SUT PROLENE 3 0 SH DA (SUTURE) ×6 IMPLANT
SUT PROLENE 6 0 C 1 30 (SUTURE) ×15 IMPLANT
SUT PROLENE 7 0 BV 1 (SUTURE) ×4 IMPLANT
SUT PROLENE 7 0 BV1 MDA (SUTURE) ×1 IMPLANT
SUT PROLENE 8 0 BV175 6 (SUTURE) ×2 IMPLANT
SUT PROLENE BLUE 7 0 (SUTURE) ×5 IMPLANT
SUT SILK  1 MH (SUTURE) ×10
SUT SILK 1 MH (SUTURE) IMPLANT
SUT STEEL 6MS V (SUTURE) ×6 IMPLANT
SUT STEEL SZ 6 DBL 3X14 BALL (SUTURE) ×5 IMPLANT
SUT VIC AB 2-0 CT1 27 (SUTURE) ×5
SUT VIC AB 2-0 CT1 TAPERPNT 27 (SUTURE) IMPLANT
SYR 30ML LL (SYRINGE) ×5 IMPLANT
SYR 3ML LL SCALE MARK (SYRINGE) ×4 IMPLANT
SYSTEM SAHARA CHEST DRAIN ATS (WOUND CARE) ×5 IMPLANT
TIP DUAL SPRAY TOPICAL (TIP) IMPLANT
TOWEL GREEN STERILE (TOWEL DISPOSABLE) ×5 IMPLANT
TOWEL GREEN STERILE FF (TOWEL DISPOSABLE) ×5 IMPLANT
TRAY FOLEY SLVR 16FR TEMP STAT (SET/KITS/TRAYS/PACK) ×5 IMPLANT
TUBE CONNECTING 12X1/4 (SUCTIONS) ×1 IMPLANT
TUBE CONNECTING 20X1/4 (TUBING) ×1 IMPLANT
TUBING ART PRESS 48 MALE/FEM (TUBING) ×2 IMPLANT
TUBING LAP HI FLOW INSUFFLATIO (TUBING) ×4 IMPLANT
UNDERPAD 30X36 HEAVY ABSORB (UNDERPADS AND DIAPERS) ×6 IMPLANT
WATER STERILE IRR 1000ML POUR (IV SOLUTION) ×10 IMPLANT

## 2020-05-18 NOTE — Progress Notes (Addendum)
  Advanced Heart Failure Rounding Note  PCP-Cardiologist: No primary care provider on file.   Subjective:    Taken to OR today for CABG + MAZE and LAA clip.   Intubated and sedated.   On IABP 1:1 + On milrinone 0.375, Epi 7.5, NE 12 + VP 0.04. On amio gtt 30   Swan #s CO 3.23 CI 1.44 PAP 48/25 (31)     Objective:   Weight Range: 108.2 kg Body mass index is 34.23 kg/m.   Vital Signs:   Temp:  [96.8 F (36 C)-98.2 F (36.8 C)] 97.2 F (36.2 C) (10/01 1700) Pulse Rate:  [40-187] 92 (10/01 1700) Resp:  [12-36] 17 (10/01 1700) BP: (65-121)/(41-103) 96/63 (10/01 0700) SpO2:  [91 %-99 %] 97 % (10/01 1700) Arterial Line BP: (75-132)/(48-78) 99/48 (10/01 1700) FiO2 (%):  [50 %] 50 % (10/01 1447) Weight:  [108.2 kg] 108.2 kg (10/01 0500)    Weight change: Filed Weights   05/16/20 1200 05/18/20 0500  Weight: 108.9 kg 108.2 kg    Intake/Output:   Intake/Output Summary (Last 24 hours) at 05/18/2020 1732 Last data filed at 05/18/2020 1700 Gross per 24 hour  Intake 6318.59 ml  Output 6163 ml  Net 155.59 ml      Physical Exam    General:  Intubated and sedated HEENT: Normal + ETT Neck: Supple. + Rt IJ Swan. Elevated JVD . Carotids 2+ bilat; no bruits. No lymphadenopathy or thyromegaly appreciated. Cor: PMI nondisplaced. Regular rate & rhythm. No rubs, gallops or murmurs. + sternal dressing + CT Lungs: intubated, clear  Abdomen: Soft, nontender, nondistended. No hepatosplenomegaly. No bruits or masses. Good bowel sounds. Extremities: No cyanosis, clubbing, rash, 1+ edema, pale extremities + Rt femoral IABP site ok  Neuro: intubated and sedated   Telemetry   A paced 92 bpm   EKG    preop EKG Afib w/ RVT 114 bpm   Labs    CBC Recent Labs    05/18/20 0220 05/18/20 0822 05/18/20 1231 05/18/20 1302 05/18/20 1507 05/18/20 1513  WBC 5.9  --   --   --  11.0*  --   HGB 11.0*   < > 6.7*   < > 10.5* 10.5*  HCT 33.1*   < > 20.5*   < > 32.0* 31.0*  MCV  89.2  --   --   --  87.9  --   PLT 165  --  95*  --  138*  --    < > = values in this interval not displayed.   Basic Metabolic Panel Recent Labs    05/17/20 2043 05/17/20 2043 05/18/20 0220 05/18/20 0822 05/18/20 1224 05/18/20 1302 05/18/20 1334 05/18/20 1513  NA 137   < > 137   < > 136   < > 139 140  K 3.3*   < > 3.1*   < > 4.2   < > 3.5 3.7  CL 100   < > 99   < > 98  --  102  --   CO2 26  --  27  --   --   --   --   --   GLUCOSE 131*   < > 129*   < > 150*  --  163*  --   BUN 15   < > 16   < > 16  --  16  --   CREATININE 1.07   < > 1.05   < > 0.70  --  0.80  --     CALCIUM 8.3*  --  8.1*  --   --   --   --   --    < > = values in this interval not displayed.   Liver Function Tests Recent Labs    05/16/20 1150  AST 35  ALT 26  ALKPHOS 50  BILITOT 1.9*  PROT 6.9  ALBUMIN 3.7   No results for input(s): LIPASE, AMYLASE in the last 72 hours. Cardiac Enzymes No results for input(s): CKTOTAL, CKMB, CKMBINDEX, TROPONINI in the last 72 hours.  BNP: BNP (last 3 results) Recent Labs    05/16/20 1155  BNP 415.7*    ProBNP (last 3 results) No results for input(s): PROBNP in the last 8760 hours.   D-Dimer No results for input(s): DDIMER in the last 72 hours. Hemoglobin A1C Recent Labs    05/18/20 0220  HGBA1C 5.3   Fasting Lipid Panel Recent Labs    05/17/20 0537  CHOL 123  HDL 32*  LDLCALC NOT CALCULATED  TRIG 71  CHOLHDL 3.8   Thyroid Function Tests Recent Labs    05/16/20 1525  TSH 1.825    Other results:   Imaging    DG Chest Port 1 View  Result Date: 05/18/2020 CLINICAL DATA:  Postsurgery EXAM: PORTABLE CHEST 1 VIEW COMPARISON:  Comparison chest x-ray 05/16/2020, CT chest 05/16/2020 FINDINGS: Interval placement of multiple lines and tubes as follows: Endotracheal tube with tip projecting approximately 3.5 cm above the carina. Enteric tube noted coursing below the diaphragm with tip collimated off view and side port likely distal to the  gastroesophageal junction. Right internal jugular venous approach Swan-Ganz catheter with tip terminating in the distal right main pulmonary artery. Inferior approach bilateral chest tubes overlie the hemi-thoraces. Inferior approach mediastinal drain overlies the mediastinum. Cardiac pacing wires overlie the lower mediastinum. Interval placement of sternotomy wires that appear intact. Vascular clips overlie the mediastinum. Cardiomediastinal silhouette is prominent likely due to AP portable view and low lung volumes. Low lung volumes with bibasilar compressive changes. No focal consolidation. No pulmonary edema. No pleural effusion. No significant pneumothorax identified. No acute osseous abnormality. Degenerative changes of the right shoulder. IMPRESSION: 1. Right internal jugular venous approach Swan-Ganz catheter with tip terminating in the distal right main pulmonary artery. Consider retracting by 1-2 cm. The remaining new lines and tubes appear to be in good position. 2. Low lung volumes with no acute cardiopulmonary abnormality. Electronically Signed   By: Morgane  Naveau M.D.   On: 05/18/2020 15:20   VAS US DOPPLER PRE CABG  Result Date: 05/17/2020 PREOPERATIVE VASCULAR EVALUATION  Indications:      Pre-CABG. Limitations:      Bandages, catheters, balloon pump insertion. Arms and legs not                   assessed secondary to balloon pump. Unable to assess right                   carotid due to bandaging/catheter. Comparison Study: No prior studies. Performing Technologist: Rachel Hodge, RDMS  Examination Guidelines: A complete evaluation includes B-mode imaging, spectral Doppler, color Doppler, and power Doppler as needed of all accessible portions of each vessel. Bilateral testing is considered an integral part of a complete examination. Limited examinations for reoccurring indications may be performed as noted.  Left Carotid Findings:  +----------+--------+--------+--------+------------------+------------------+           PSV cm/sEDV cm/sStenosisDescribe          Comments           +----------+--------+--------+--------+------------------+------------------+   CCA Prox                                            intimal thickening +----------+--------+--------+--------+------------------+------------------+ CCA Distal                                          intimal thickening +----------+--------+--------+--------+------------------+------------------+ ICA Prox                          mild, heterogenoustortuous           +----------+--------+--------+--------+------------------+------------------+ ECA                                                 patent             +----------+--------+--------+--------+------------------+------------------+ +----------+--------+--------+--------+------------+ SubclavianPSV cm/sEDV cm/sDescribeArm Pressure +----------+--------+--------+--------+------------+                           WNL                  +----------+--------+--------+--------+------------+ +---------+--------+--------+---------+ VertebralPSV cm/sEDV cm/sAntegrade +---------+--------+--------+---------+    Preliminary       Medications:     Scheduled Medications: . [START ON 05/19/2020] acetaminophen  1,000 mg Oral Q6H   Or  . [START ON 05/19/2020] acetaminophen (TYLENOL) oral liquid 160 mg/5 mL  1,000 mg Per Tube Q6H  . alfuzosin  10 mg Oral Q breakfast  . [START ON 05/19/2020] aspirin EC  325 mg Oral Daily   Or  . [START ON 05/19/2020] aspirin  324 mg Per Tube Daily  . [START ON 05/19/2020] bisacodyl  10 mg Oral Daily   Or  . [START ON 05/19/2020] bisacodyl  10 mg Rectal Daily  . chlorhexidine gluconate (MEDLINE KIT)  15 mL Mouth Rinse BID  . Chlorhexidine Gluconate Cloth  6 each Topical Daily  . [START ON 05/19/2020] docusate sodium  200 mg Oral Daily  . latanoprost  1 drop Both  Eyes QHS  . mouth rinse  15 mL Mouth Rinse 10 times per day  . metoprolol tartrate  12.5 mg Oral BID   Or  . metoprolol tartrate  12.5 mg Per Tube BID  . mupirocin ointment  1 application Nasal BID  . [START ON 05/20/2020] pantoprazole  40 mg Oral Daily  . [START ON 05/19/2020] sodium chloride flush  3 mL Intravenous Q12H     Infusions: . sodium chloride Stopped (05/18/20 1633)  . [START ON 05/19/2020] sodium chloride    . sodium chloride    . amiodarone 30 mg/hr (05/18/20 1700)  . cefUROXime (ZINACEF)  IV    . dexmedetomidine (PRECEDEX) IV infusion 0.7 mcg/kg/hr (05/18/20 1700)  . epinephrine 7.5 mcg/min (05/18/20 1700)  . famotidine (PEPCID) IV Stopped (05/18/20 1529)  . insulin 2.4 mL/hr at 05/18/20 1700  . lactated ringers    . lactated ringers 20 mL/hr at 05/18/20 1700  . magnesium sulfate 20 mL/hr at 05/18/20 1700  . milrinone 0.375 mcg/kg/min (05/18/20 1700)  . nitroGLYCERIN 7 mcg/min (05/18/20 1700)  . norepinephrine (LEVOPHED) Adult infusion 12 mcg/min (05/18/20 1700)  . phenylephrine (NEO-SYNEPHRINE)  Adult infusion Stopped (05/18/20 1604)  . potassium chloride 50 mL/hr at 05/18/20 1700  . vancomycin    . vasopressin 0.04 Units/min (05/18/20 1700)     PRN Medications:  sodium chloride, dextrose, metoprolol tartrate, midazolam, morphine injection, ondansetron (ZOFRAN) IV, oxyCODONE, sodium chloride flush, [START ON 05/19/2020] sodium chloride flush, traMADol    Assessment/Plan   1. CAD/NSTEMI with critcal LM disease - HS Trop 1681>8044>>6860. LHC with severe multivessel disease.  - S/p CABG 10/1 - ASA per tube, add statin   2. Acute Systolic Heart Failure>>Cardiogenic Shock  - ECHO in 2020 showed EF 60-65%.  - ECHO this admit showed EF is down to 25-30 %. RV normal.  - ICM. Cath this admit w/ severe MVCAD w/ critical LM disease - Swan placed and showed markedly elevated filling pressures and low cardiac output. IABP subsequently placed. - s/p CABG 10/21 -  continue IABP at 1:1  - continue milrinone, NE, EPI and VP, wean as tolerated    3. A fib RVR  - s/p MAZE w/ LAA Clip  - continue amio gtt at 30 mg/hr  - monitor on tele.   4. H/o Prostate Cancer - coudet catheter in place due to massive prostate enlargement   Length of Stay: 2  Brittainy Simmons, PA-C  05/18/2020, 5:32 PM  Advanced Heart Failure Team Pager [phone] (M-F; 7a - 4p)  Please contact [facility] for night-coverage after hours (4p -7a ) and weekends on [email or web address]  Agree with above.  He is immediate post-op of CABG and Maze due to critical LM disease and acute systolic HF with EF 25-30%   On multiple pressors and has IABP with persistent low CO/CI. I reshot numbers personally and CI now 1.4 -> 1.6.   Remains intubated/sedated. Making urine.   General:  Intubated/sedated pale HEENT: normal + ETT Neck: supple. RIJ swan  Cor:  Sternal dressing ok Regular rate & rhythm.  +CTs Lungs: coarse Abdomen: soft, nontender, +distended. Quiet bowel sounds. Extremities: no cyanosis, clubbing, rash, 1-2+ edema  + RFA IABP Neuro: intubated/sedated  He remains tenuous immediately post-op CABG and Maze. Will increase NE and follow hemodynamics. Maintaining NSR. Continue amio. Start diuresis tomorrow.   CRITICAL CARE Performed by: ,   Total critical care time: 45 minutes  Critical care time was exclusive of separately billable procedures and treating other patients.  Critical care was necessary to treat or prevent imminent or life-threatening deterioration.  Critical care was time spent personally by me (independent of midlevel providers or residents) on the following activities: development of treatment plan with patient and/or surrogate as well as nursing, discussions with consultants, evaluation of patient's response to treatment, examination of patient, obtaining history from patient or surrogate, ordering and performing treatments and interventions,  ordering and review of laboratory studies, ordering and review of radiographic studies, pulse oximetry and re-evaluation of patient's condition.   , MD  7:13 PM   

## 2020-05-18 NOTE — Anesthesia Procedure Notes (Signed)
Procedure Name: Intubation Date/Time: 05/18/2020 7:56 AM Performed by: Cleda Daub, CRNA Pre-anesthesia Checklist: Patient identified, Emergency Drugs available, Suction available and Patient being monitored Patient Re-evaluated:Patient Re-evaluated prior to induction Oxygen Delivery Method: Circle system utilized Preoxygenation: Pre-oxygenation with 100% oxygen Induction Type: IV induction Ventilation: Mask ventilation without difficulty Laryngoscope Size: Mac and 3 Grade View: Grade I Tube type: Oral Tube size: 7.5 mm Number of attempts: 1 Airway Equipment and Method: Stylet and Oral airway Placement Confirmation: ETT inserted through vocal cords under direct vision,  positive ETCO2 and breath sounds checked- equal and bilateral Secured at: 23 cm Tube secured with: Tape Dental Injury: Teeth and Oropharynx as per pre-operative assessment

## 2020-05-18 NOTE — Transfer of Care (Signed)
Immediate Anesthesia Transfer of Care Note  Patient: Eric Stokes  Procedure(s) Performed: CORONARY ARTERY BYPASS GRAFTING (CABG) TIMES FOUR USING INTERNAL BILATERAL MAMMARY ARTERIES AND HARVESTED LEFT RADIAL ARTERY. (N/A Chest) MAZE USING ATRICURE ISOLATOR CLAMP OLL2 (N/A Chest) RADIAL ARTERY HARVEST (Left Arm Lower) TRANSESOPHAGEAL ECHOCARDIOGRAM (TEE) (N/A ) INDOCYANINE GREEN FLUORESCENCE IMAGING (ICG) (N/A ) CLIPPING OF ATRIAL APPENDAGE USING ATRICURE 38 MM ATRICLIP (Left Chest)  Patient Location: ICU  Anesthesia Type:General  Level of Consciousness: Patient remains intubated per anesthesia plan  Airway & Oxygen Therapy: Patient remains intubated per anesthesia plan and Patient placed on Ventilator (see vital sign flow sheet for setting)  Post-op Assessment: Report given to RN and Post -op Vital signs reviewed and stable  Post vital signs: Reviewed and stable  Last Vitals:  Vitals Value Taken Time  BP    Temp    Pulse 91 05/18/20 1447  Resp 12 05/18/20 1447  SpO2 96 % 05/18/20 1447    Last Pain:  Vitals:   05/18/20 0400  TempSrc: Core  PainSc: Asleep      Patients Stated Pain Goal: 2 (27/61/47 0929)  Complications: No complications documented.

## 2020-05-18 NOTE — Anesthesia Preprocedure Evaluation (Signed)
Anesthesia Evaluation  Patient identified by MRN, date of birth, ID band Patient awake    Reviewed: Allergy & Precautions, H&P , NPO status , Patient's Chart, lab work & pertinent test results  Airway Mallampati: II   Neck ROM: full    Dental   Pulmonary asthma , sleep apnea , COPD, former smoker,    breath sounds clear to auscultation       Cardiovascular hypertension, + CAD, + Past MI and +CHF   Rhythm:regular Rate:Normal  IABP in place. HK in anteroseptal, anterior, and anterolateral wals.  HK in apex. EF 25%   Neuro/Psych    GI/Hepatic   Endo/Other    Renal/GU      Musculoskeletal  (+) Arthritis ,   Abdominal   Peds  Hematology   Anesthesia Other Findings   Reproductive/Obstetrics                             Anesthesia Physical Anesthesia Plan  ASA: III  Anesthesia Plan: General   Post-op Pain Management:    Induction: Intravenous  PONV Risk Score and Plan: 2 and Ondansetron, Dexamethasone, Midazolam and Treatment may vary due to age or medical condition  Airway Management Planned: Oral ETT  Additional Equipment: Arterial line, CVP, PA Cath, TEE and Ultrasound Guidance Line Placement  Intra-op Plan:   Post-operative Plan: Post-operative intubation/ventilation  Informed Consent: I have reviewed the patients History and Physical, chart, labs and discussed the procedure including the risks, benefits and alternatives for the proposed anesthesia with the patient or authorized representative who has indicated his/her understanding and acceptance.       Plan Discussed with: CRNA, Anesthesiologist and Surgeon  Anesthesia Plan Comments:         Anesthesia Quick Evaluation

## 2020-05-18 NOTE — Progress Notes (Signed)
      [address]       [place] [address]             [phone]      S/p CABG, Maze, LAA clip  Intubated, sedated  BP 96/63   Pulse 92   Temp (!) 96.6 F (35.9 C)   Resp 18   Ht 5\' 10"  (1.778 m)   Wt 108.2 kg   SpO2 96%   BMI 34.23 kg/m  47/24 CI= 1.6 On IABP 1:1, epi 7.5, milrinone 0.375, norepi 12, vasopressin 0.04  IMV 12/50%/5   Intake/Output Summary (Last 24 hours) at 05/18/2020 1810 Last data filed at 05/18/2020 1800 Gross per 24 hour  Intake 6963.69 ml  Output 5943 ml  Net 1020.69 ml   Minimal CT output  Continue current Rx  Eric Bethune C. Roxan Hockey, MD [facility] [phone]

## 2020-05-18 NOTE — Discharge Summary (Signed)
Physician Discharge Summary        Eric Stokes       [place] [address]             [phone]       Patient ID: Eric Stokes MRN: [id number] DOB/AGE: 75-Apr-1947 75 y.o.  Admit date: 05/16/2020 Discharge date: 05/30/2020  Admission Diagnoses: Patient Active Problem List   Diagnosis Date Noted  . S/P CABG x 4 05/18/2020  . NSTEMI (non-ST elevated myocardial infarction) ([place]) 05/17/2020  . Atrial fibrillation with RVR ([place]) 05/16/2020  . Osteoarthritis of right knee 03/14/2020  . Colonic polyp 01/09/2020  . Prostate cancer ([place]) 11/30/2019  . Lymphadenopathy 11/30/2019  . Obesity (BMI 30.0-34.9) 04/08/2019  . TIA (transient ischemic attack) 02/28/2019  . At risk for bradycardia 06/15/2018  . B12 deficiency, with monthly injections 06/15/2018  . Primary osteoarthritis of right knee, followed by Orthopedics 04/12/2018  . Asthma, allergic, prn albuterol, triggered by perfumes 03/19/2018  . OSA on CPAP 09/25/2015  . Hyperplasia of prostate with lower urinary tract symptoms (LUTS) 09/03/2012  . Vitamin D deficiency 11/26/2010  . Allergic rhinitis, using Flonase 11/20/2010  . Hearing loss 11/20/2010  . Dyslipidemia, on Pravastatin 12/21/2008  . Benign essential hypertension 12/21/2008    Discharge Diagnoses:  Active Problems:   Atrial fibrillation with RVR ([facility])   NSTEMI (non-ST elevated myocardial infarction) ([facility])   S/P CABG x 4   Discharged Condition: good  HPI:   This is a 74 year old male with a past medical history of hypertension, hyperlipidemia, OSA (on CPAP), prostate cancer who had complaints of increasing shortness of breath with activity and chest tightness. According to the patient, he is fairly active at home (recently dug a trench and rebuilt a deck) but has noticed intermittent chest tightness (denies rest pain) and dyspnea on exertion. He also has complaints of chronic RLE edema since having had knee surgery this past July as  well as decreased appetite. Patient states he had diaphoresis on most recent chest tightness and has had some radiation to his right arm. EMS was summonsed on 09/29 due to worsening chest discomfort. On presentation to [facility] ED, patient was in atrial fibrillation with RVR. He was put on a Cardizem drip. Initial Troponin I (high sensitivity) was 1681 and has peaked at 8,044. He ruled in for a NSTEMI. BNP was elevated at 415.7 as well. He underwent a cardiac catheterization earlier this am and was found to have a 99% ostial to mid left main, 99% ostial to proximal RCA, and 80% proximal to mid RCA. Dr. Orvan Stokes has been consulted for consideration of coronary artery bypass grafting surgery. Echo is ordered. Of note, patient noted to have mild aortic stenosis on echo done in July 2020. At the time of my exam, patient is chest pain free.  Dr. Orvan Stokes discussed the need for coronary artery bypass grafting surgery. Potential risks, benefits, and complications of the surgery were discussed with the patient and he agreed to proceed with surgery.   Hospital Course:   On 10/1 [name] underwent a coronary artery bypass grafting, MAZE, and clipping of atrial appendage with Dr. Orvan Stokes. He tolerated the procedure well and was transferred to the surgical ICU in stable condition. He remained intubated but moving all 4 extremities.  He remained on multiple iontropes and his IABP was 1:1. We obtained an Echo which showed: The anteroseptal wall is akinetic. Anterolateral wall, entire apex, mid to distal anterior walls are hypokinetic. Left  ventricular ejection fraction, by estimation, is 25%. The left ventricle has severely decreased function. No evidence of pericardial effusion. We continued to wean pressors as the patient tolerated. IABP was changed to 1:2. Continued to have good diuresis. We continued to wean the IABP and possible extubate. We continue to wean his intra-aortic balloon pump. His balloon pump was removed  on 10 /4. He was extubated postop day 5. We discontinued his Swan-Ganz catheter and arterial line. We consider core track placement and consulted SLP due to prolonged ventilation. We began to mobilize patient. Rapid response was called on 10/08 due to some abdominal heart sounds. He did have a documented friction rub and some mild chest pressure. Since he had a pericardial rub we checked an echocardiogram which showed: septal hypokinesis, basal to mid hypokineses with an estimated LVEF of 40-45%.  IR was consulted for drainage of a pleural  effusion. Unfortunately there was no window which it was safe to place a chest tube per Dr. Vernard Gambles. POD 10 he was stable to discontinue milrinone and CIR had been consulted. Today, he is tolerating room air, his incisions are healing well, he is ambulating with some assistance, and he is ready for discharge to home with home health resources.   Consults: SLP, heart failure, nutrition   Significant Diagnostic Studies:   CLINICAL DATA:  Chest pain and shortness of breath today  EXAM: PORTABLE CHEST 1 VIEW  COMPARISON:  None.  FINDINGS: Cardiomegaly and diffuse interstitial prominence with cephalized blood flow. Trace pleural fluid is possible on the right. Negative aortic contours for technique.  IMPRESSION: CHF pattern.   Electronically Signed   By: Monte Fantasia M.D.   On: 05/16/2020 11:58  Treatments:  CARDIOTHORACIC SURGERY OPERATIVE NOTE  Date of Procedure:    05/18/2020 Preoperative Diagnosis:      Severe 3-vessel Coronary Artery Disease, including left main coronary artery disease and atrial fibrillation  Postoperative Diagnosis:    Same  Procedure:          Modified maze procedure (bilateral pulmonary vein isolation and right atrial appendage clipping) coronary Artery Bypass Grafting x 4             Left Internal Mammary Artery to Distal Left Anterior Descending Coronary Artery; pedicled right internal mammary artery to  posterior Descending Coronary Artery with coronary endarterectomy; left radial artery graft to first and second obtuse Marginal Branches of Left Circumflex Coronary Artery as a sequenced graft Bilateral internal mammary artery harvesting Left radial artery harvesting, open Completion indocyanine green fluorescence imaging Surgeon:        B.  Murvin Natal, MD  Assistant:       Evonnie Pat, PA-C  Anesthesia:    eneral endotracheal  Operative Findings: ? Severely reduced left ventricular systolic function ? Good quality left internal mammary artery conduit ? Good quality radial artery conduit ? Good quality target vessels for grafting except for PDA which was extensively disease at the area of coronary opening    Discharge Exam: Blood pressure (!) 101/58, pulse 81, temperature 97.6 F (36.4 C), temperature source Oral, resp. rate 20, height 5\' 10"  (1.778 m), weight 107 kg, SpO2 96 %.   General appearance: alert, cooperative and no distress Heart: regular rate and rhythm, S1, S2 normal, no murmur, click, rub or gallop Lungs: clear to auscultation bilaterally Abdomen: soft, non-tender; bowel sounds normal; no masses,  no organomegaly Extremities: extremities normal, atraumatic, no cyanosis or edema Wound: clean and dry  Disposition: Discharge disposition: 01-Home  or Self Care       Discharge Instructions    Amb Referral to Cardiac Rehabilitation   Complete by: As directed    Diagnosis: CABG   CABG X ___: 4   After initial evaluation and assessments completed: Virtual Based Care may be provided alone or in conjunction with Phase 2 Cardiac Rehab based on patient barriers.: Yes     Allergies as of 05/30/2020      Reactions   Tamsulosin Other (See Comments)   dizziness   Cipro [ciprofloxacin Hcl] Swelling   Knee swelling, joint pain   Lipitor [atorvastatin] Other (See Comments)   Leg Cramps   Lisinopril Cough      Medication List    STOP taking these medications    ibuprofen 400 MG tablet Commonly known as: ADVIL   losartan 25 MG tablet Commonly known as: COZAAR   pravastatin 20 MG tablet Commonly known as: PRAVACHOL     TAKE these medications   acetaminophen 500 MG tablet Commonly known as: TYLENOL Take 1,000 mg by mouth at bedtime.   albuterol 108 (90 Base) MCG/ACT inhaler Commonly known as: VENTOLIN HFA Inhale 2 puffs into the lungs every 6 (six) hours as needed for wheezing or shortness of breath.   alfuzosin 10 MG 24 hr tablet Commonly known as: UROXATRAL Take 5 mg by mouth in the morning and at bedtime.   amiodarone 200 MG tablet Commonly known as: PACERONE Take 1 tablet (200 mg total) by mouth 2 (two) times daily.   apixaban 5 MG Tabs tablet Commonly known as: ELIQUIS Take 1 tablet (5 mg total) by mouth 2 (two) times daily. What changed:   medication strength  how much to take   aspirin 81 MG EC tablet Take 1 tablet (81 mg total) by mouth daily. Swallow whole.   carboxymethylcellulose 0.5 % Soln Commonly known as: REFRESH PLUS Place 1 drop into both eyes 2 (two) times daily as needed (dry eyes).   cetirizine 10 MG tablet Commonly known as: ZYRTEC Take 10 mg by mouth daily.   colchicine 0.6 MG tablet Take 1 tablet (0.6 mg total) by mouth 2 (two) times daily.   finasteride 5 MG tablet Commonly known as: PROSCAR Take 5 mg by mouth daily.   fluticasone 50 MCG/ACT nasal spray Commonly known as: FLONASE USE TWO SPRAYS IN EACH NOSTRIL DAILY AS NEEDED. What changed: See the new instructions.   furosemide 80 MG tablet Commonly known as: Lasix Take 1 tablet (80 mg total) by mouth daily.   latanoprost 0.005 % ophthalmic solution Commonly known as: XALATAN INSTILL 1 DROP INTO BOTH EYES AT BEDTIME What changed: when to take this   montelukast 10 MG tablet Commonly known as: SINGULAIR TAKE 1 TABLET BY MOUTH EVERY DAY AT BEDTIME   NON FORMULARY Cpap at night   oxyCODONE 5 MG immediate release tablet Commonly  known as: Oxy IR/ROXICODONE Take 1 tablet (5 mg total) by mouth every 6 (six) hours as needed for severe pain (use tramadol first for severe pain - if ineffective may give oxycodone).   rosuvastatin 20 MG tablet Commonly known as: CRESTOR Take 1 tablet (20 mg total) by mouth daily.   sacubitril-valsartan 24-26 MG Commonly known as: ENTRESTO Take 1 tablet by mouth 2 (two) times daily.   spironolactone 25 MG tablet Commonly known as: ALDACTONE Take 1 tablet (25 mg total) by mouth daily.   Vitamin D3 50 MCG (2000 UT) Tabs Take 2,000 Int'l Units by mouth daily.  Durable Medical Equipment  (From admission, onward)         Start     Ordered   05/29/20 1515  For home use only DME Bedside commode  Once       Question:  Patient needs a bedside commode to treat with the following condition  Answer:  Weakness   05/29/20 1514          Follow-up Information    Lorretta Harp, MD Follow up.   Specialties: Cardiology, Radiology Contact information: [address] [place] [address] [phone]        Wonda Olds, MD Follow up.   Specialty: Cardiothoracic Surgery Why: Your routine follow-up appointment is on 10/25 at 3:00pm. Please arrive at 2:30pm for a chest xray which is located at [facility] imaging on the first floor of our building.  Contact information: [address] [place] [address] [phone]        Leamon Arnt, MD Follow up.   Specialty: Family Medicine Contact information: [place] [address] [phone]        staple removal appointment Follow up.   Why: Your follow-up is on 06/05/2020 at 11:00am Contact information: Dr. Orvan Stokes office       [facility] Follow up on 06/04/2020.   Specialty: Cardiology Why: 10:40 [place] code 3009. Located on the 1st floor at [facility]. Entrance C Contact information: [address] [phone] [place] [facility] Follow up.   Why: [facility] Contact information: [place] [address] [phone]        [facility] Follow up.   Why: hhpt Contact information: [place] [phone]            The patient has been discharged on:   1.Beta Blocker:  Yes [   ]                              No   [ x  ]                              If No, reason: low cardiac output  2.Ace Inhibitor/ARB: Yes [ yes  ]                                     No  [    ]                                     If No, reason:  3.Statin:   Yes [  yes ]                  No  [   ]                  If No, reason:  4.Ecasa:  Yes  [ yes  ]                  No   [   ]  If No, reason:    Signed: Elgie Collard 05/30/2020, 8:00 AM

## 2020-05-18 NOTE — Anesthesia Procedure Notes (Signed)
Arterial Line Insertion Start/End10/08/2019 7:43 AM, 05/18/2020 7:47 AM Performed by: Lance Coon, CRNA  Patient location: Pre-op. Preanesthetic checklist: patient identified, IV checked, site marked, risks and benefits discussed, surgical consent, monitors and equipment checked, pre-op evaluation and anesthesia consent Lidocaine 1% used for infiltration radial was placed Catheter size: 20 Fr Hand hygiene performed , maximum sterile barriers used  and Seldinger technique used Allen's test indicative of satisfactory collateral circulation Attempts: 1 Procedure performed without using ultrasound guided technique. Ultrasound Notes:anatomy identified, needle tip was noted to be adjacent to the nerve/plexus identified and no ultrasound evidence of intravascular and/or intraneural injection Following insertion, dressing applied. Post procedure assessment: normal and unchanged  Patient tolerated the procedure well with no immediate complications.

## 2020-05-18 NOTE — Progress Notes (Signed)
  Echocardiogram Echocardiogram Transesophageal has been performed.  Eric Stokes 05/18/2020, 10:29 AM

## 2020-05-18 NOTE — Brief Op Note (Signed)
05/16/2020 - 05/18/2020  12:47 PM  PATIENT:  Eric Stokes  74 y.o. male  PRE-OPERATIVE DIAGNOSIS:  CAD  POST-OPERATIVE DIAGNOSIS:  CAD  PROCEDURE:  Procedure(s) with comments: CORONARY ARTERY BYPASS GRAFTING (CABG) (N/A) - possible BILATERAL IMA MAZE USING ATRICURE ISOLATOR CLAMP OLL2 (N/A) RADIAL ARTERY HARVEST (Left) TRANSESOPHAGEAL ECHOCARDIOGRAM (TEE) (N/A) INDOCYANINE GREEN FLUORESCENCE IMAGING (ICG) (N/A) CLIPPING OF ATRIAL APPENDAGE USING ATRICURE 45 MM ATRICLIP (Left) LIMA-LAD LEFT RADIAL- OM1-OM2 RIMA-RPL  SURGEON:  Surgeon(s) and Role:    * Wonda Olds, MD - Primary  PHYSICIAN ASSISTANT: WAYNE GOLD PA-C  ASSISTANTS: STAFF   ANESTHESIA:   general  EBL: per anes  BLOOD ADMINISTERED: SEE ANESTH/PERFUSION RECORDS  DRAINS: PLEURAL AMD MEDIASINAL CHEST TUBES   LOCAL MEDICATIONS USED:  OTHER EXPAREL  SPECIMEN:  No Specimen  DISPOSITION OF SPECIMEN:  N/A  COUNTS:  YES  TOURNIQUET:  * No tourniquets in log *  DICTATION: .Dragon Dictation  PLAN OF CARE: Admit to inpatient   PATIENT DISPOSITION:  ICU - intubated and hemodynamically stable.   Delay start of Pharmacological VTE agent (>24hrs) due to surgical blood loss or risk of bleeding: yes  COMPLICATIONS: NO KNOWN  Agree with documentation. Clarrisa Kaylor Z. Orvan Seen, [place]

## 2020-05-18 NOTE — H&P (Signed)
History and Physical Interval Note:  05/18/2020 6:57 AM  [name]  has presented today for surgery, with the diagnosis of CAD.  The various methods of treatment have been discussed with the patient and family. After consideration of risks, benefits and other options for treatment, the patient has consented to PROCEDURE: coronary artery bypass grafting and associated procedures, possible left radial artery harvesting; atrial ablation (Maze) as a surgical intervention.  The patient's history has been reviewed, patient examined, no change in status, stable for surgery.  I have reviewed the patient's chart and labs.  Questions were answered to the patient's satisfaction.     Eric Stokes

## 2020-05-19 ENCOUNTER — Inpatient Hospital Stay (HOSPITAL_COMMUNITY): Payer: No Typology Code available for payment source

## 2020-05-19 ENCOUNTER — Other Ambulatory Visit: Payer: Self-pay

## 2020-05-19 ENCOUNTER — Encounter (HOSPITAL_COMMUNITY): Payer: Self-pay | Admitting: Cardiothoracic Surgery

## 2020-05-19 DIAGNOSIS — I255 Ischemic cardiomyopathy: Secondary | ICD-10-CM

## 2020-05-19 LAB — BPAM PLATELET PHERESIS
Blood Product Expiration Date: 202110022359
ISSUE DATE / TIME: 202110011258
Unit Type and Rh: 9500

## 2020-05-19 LAB — POCT I-STAT 7, (LYTES, BLD GAS, ICA,H+H)
Acid-base deficit: 7 mmol/L — ABNORMAL HIGH (ref 0.0–2.0)
Acid-base deficit: 1 mmol/L (ref 0.0–2.0)
Bicarbonate: 18.7 mmol/L — ABNORMAL LOW (ref 20.0–28.0)
Bicarbonate: 23.8 mmol/L (ref 20.0–28.0)
Calcium, Ion: 1.27 mmol/L (ref 1.15–1.40)
Calcium, Ion: 1.2 mmol/L (ref 1.15–1.40)
HCT: 30 % — ABNORMAL LOW (ref 39.0–52.0)
HCT: 30 % — ABNORMAL LOW (ref 39.0–52.0)
Hemoglobin: 10.2 g/dL — ABNORMAL LOW (ref 13.0–17.0)
Hemoglobin: 10.2 g/dL — ABNORMAL LOW (ref 13.0–17.0)
O2 Saturation: 97 %
O2 Saturation: 98 %
Patient temperature: 36.3
Patient temperature: 36.1
Potassium: 3.8 mmol/L (ref 3.5–5.1)
Potassium: 4 mmol/L (ref 3.5–5.1)
Sodium: 140 mmol/L (ref 135–145)
Sodium: 141 mmol/L (ref 135–145)
TCO2: 20 mmol/L — ABNORMAL LOW (ref 22–32)
TCO2: 25 mmol/L (ref 22–32)
pCO2 arterial: 36.1 mmHg (ref 32.0–48.0)
pCO2 arterial: 39.5 mmHg (ref 32.0–48.0)
pH, Arterial: 7.318 — ABNORMAL LOW (ref 7.350–7.450)
pH, Arterial: 7.383 (ref 7.350–7.450)
pO2, Arterial: 95 mmHg (ref 83.0–108.0)
pO2, Arterial: 95 mmHg (ref 83.0–108.0)

## 2020-05-19 LAB — CBC
HCT: 31.3 % — ABNORMAL LOW (ref 39.0–52.0)
HCT: 30.9 % — ABNORMAL LOW (ref 39.0–52.0)
Hemoglobin: 10.1 g/dL — ABNORMAL LOW (ref 13.0–17.0)
Hemoglobin: 10.3 g/dL — ABNORMAL LOW (ref 13.0–17.0)
MCH: 28.5 pg (ref 26.0–34.0)
MCH: 29.1 pg (ref 26.0–34.0)
MCHC: 32.3 g/dL (ref 30.0–36.0)
MCHC: 33.3 g/dL (ref 30.0–36.0)
MCV: 88.2 fL (ref 80.0–100.0)
MCV: 87.3 fL (ref 80.0–100.0)
Platelets: 134 10*3/uL — ABNORMAL LOW (ref 150–400)
Platelets: 104 10*3/uL — ABNORMAL LOW (ref 150–400)
RBC: 3.55 MIL/uL — ABNORMAL LOW (ref 4.22–5.81)
RBC: 3.54 MIL/uL — ABNORMAL LOW (ref 4.22–5.81)
RDW: 13.7 % (ref 11.5–15.5)
RDW: 14.2 % (ref 11.5–15.5)
WBC: 9.5 10*3/uL (ref 4.0–10.5)
WBC: 10.4 10*3/uL (ref 4.0–10.5)
nRBC: 0 % (ref 0.0–0.2)
nRBC: 0 % (ref 0.0–0.2)

## 2020-05-19 LAB — BASIC METABOLIC PANEL
Anion gap: 11 (ref 5–15)
Anion gap: 7 (ref 5–15)
BUN: 17 mg/dL (ref 8–23)
BUN: 15 mg/dL (ref 8–23)
CO2: 19 mmol/L — ABNORMAL LOW (ref 22–32)
CO2: 22 mmol/L (ref 22–32)
Calcium: 8.5 mg/dL — ABNORMAL LOW (ref 8.9–10.3)
Calcium: 7.8 mg/dL — ABNORMAL LOW (ref 8.9–10.3)
Chloride: 107 mmol/L (ref 98–111)
Chloride: 109 mmol/L (ref 98–111)
Creatinine, Ser: 1.15 mg/dL (ref 0.61–1.24)
Creatinine, Ser: 1.03 mg/dL (ref 0.61–1.24)
GFR calc Af Amer: >60 mL/min (ref >60)
GFR calc Af Amer: >60 mL/min (ref >60)
GFR calc non Af Amer: >60 mL/min (ref >60)
GFR calc non Af Amer: >60 mL/min (ref >60)
Glucose, Bld: 134 mg/dL — ABNORMAL HIGH (ref 70–99)
Glucose, Bld: 109 mg/dL — ABNORMAL HIGH (ref 70–99)
Potassium: 3.8 mmol/L (ref 3.5–5.1)
Potassium: 4.3 mmol/L (ref 3.5–5.1)
Sodium: 137 mmol/L (ref 135–145)
Sodium: 138 mmol/L (ref 135–145)

## 2020-05-19 LAB — MAGNESIUM
Magnesium: 2.3 mg/dL (ref 1.7–2.4)
Magnesium: 2.2 mg/dL (ref 1.7–2.4)

## 2020-05-19 LAB — ECHO INTRAOPERATIVE TEE
AV Mean grad: 9 mmHg
AV Peak grad: 15.8 mmHg
Ao pk vel: 1.99 m/s
Calc EF: 46.7 %
Height: 70 in
Single Plane A2C EF: 41.6 %
Single Plane A4C EF: 49.9 %
Weight: 3816.6 oz

## 2020-05-19 LAB — PREPARE PLATELET PHERESIS: Unit division: 0

## 2020-05-19 LAB — ECHOCARDIOGRAM LIMITED
Height: 70 in
S' Lateral: 3.5 cm
Weight: 4128 [oz_av]

## 2020-05-19 LAB — GLUCOSE, CAPILLARY
Glucose-Capillary: 107 mg/dL — ABNORMAL HIGH (ref 70–99)
Glucose-Capillary: 109 mg/dL — ABNORMAL HIGH (ref 70–99)
Glucose-Capillary: 109 mg/dL — ABNORMAL HIGH (ref 70–99)
Glucose-Capillary: 110 mg/dL — ABNORMAL HIGH (ref 70–99)
Glucose-Capillary: 110 mg/dL — ABNORMAL HIGH (ref 70–99)
Glucose-Capillary: 110 mg/dL — ABNORMAL HIGH (ref 70–99)
Glucose-Capillary: 114 mg/dL — ABNORMAL HIGH (ref 70–99)
Glucose-Capillary: 118 mg/dL — ABNORMAL HIGH (ref 70–99)
Glucose-Capillary: 119 mg/dL — ABNORMAL HIGH (ref 70–99)
Glucose-Capillary: 119 mg/dL — ABNORMAL HIGH (ref 70–99)
Glucose-Capillary: 120 mg/dL — ABNORMAL HIGH (ref 70–99)
Glucose-Capillary: 126 mg/dL — ABNORMAL HIGH (ref 70–99)
Glucose-Capillary: 146 mg/dL — ABNORMAL HIGH (ref 70–99)
Glucose-Capillary: 150 mg/dL — ABNORMAL HIGH (ref 70–99)
Glucose-Capillary: 164 mg/dL — ABNORMAL HIGH (ref 70–99)
Glucose-Capillary: 98 mg/dL (ref 70–99)

## 2020-05-19 LAB — COOXEMETRY PANEL
Carboxyhemoglobin: 1 % (ref 0.5–1.5)
Carboxyhemoglobin: 1 % (ref 0.5–1.5)
Methemoglobin: 1 % (ref 0.0–1.5)
Methemoglobin: 1 % (ref 0.0–1.5)
O2 Saturation: 74.4 %
O2 Saturation: 57.3 %
Total hemoglobin: 10.4 g/dL — ABNORMAL LOW (ref 12.0–16.0)
Total hemoglobin: 10.5 g/dL — ABNORMAL LOW (ref 12.0–16.0)

## 2020-05-19 LAB — LACTIC ACID, PLASMA
Lactic Acid, Venous: 1.8 mmol/L (ref 0.5–1.9)
Lactic Acid, Venous: 1.7 mmol/L (ref 0.5–1.9)

## 2020-05-19 LAB — HEMOGLOBIN AND HEMATOCRIT, BLOOD
HCT: 20.5 % — ABNORMAL LOW (ref 39.0–52.0)
Hemoglobin: 6.7 g/dL — CL (ref 13.0–17.0)

## 2020-05-19 MED ORDER — ALBUMIN HUMAN 5 % IV SOLN
12.5000 g | Freq: Once | INTRAVENOUS | Status: AC
  Administered 2020-05-19: 12.5 g via INTRAVENOUS

## 2020-05-19 MED ORDER — ROSUVASTATIN CALCIUM 20 MG PO TABS: 20.0000 mg | ORAL_TABLET | Freq: Every day | ORAL | Status: DC

## 2020-05-19 MED ORDER — SODIUM BICARBONATE 8.4 % IV SOLN
100.0000 meq | Freq: Once | INTRAVENOUS | Status: AC
  Administered 2020-05-19: 100 meq via INTRAVENOUS
  Filled 2020-05-19: qty 50

## 2020-05-19 MED ORDER — FUROSEMIDE 10 MG/ML IJ SOLN
6.0000 mg/h | INTRAVENOUS | Status: DC
  Administered 2020-05-19: 6 mg/h via INTRAVENOUS
  Administered 2020-05-20: 10 mg/h via INTRAVENOUS
  Administered 2020-05-21 – 2020-05-26 (×5): 6 mg/h via INTRAVENOUS
  Filled 2020-05-19: qty 20
  Filled 2020-05-19 (×5): qty 25

## 2020-05-19 MED ORDER — PANTOPRAZOLE SODIUM 40 MG PO PACK
40.0000 mg | PACK | Freq: Every day | ORAL | Status: DC
  Administered 2020-05-19 – 2020-05-21 (×3): 40 mg
  Filled 2020-05-19 (×3): qty 20

## 2020-05-19 MED ORDER — PERFLUTREN LIPID MICROSPHERE
1.0000 mL | INTRAVENOUS | Status: AC | PRN
  Administered 2020-05-19: 3 mL via INTRAVENOUS
  Filled 2020-05-19: qty 10

## 2020-05-19 MED ORDER — OXYCODONE HCL 5 MG PO TABS
5.0000 mg | ORAL_TABLET | ORAL | Status: DC | PRN
  Administered 2020-05-20 – 2020-05-21 (×3): 10 mg
  Filled 2020-05-19 (×3): qty 2

## 2020-05-19 MED ORDER — CALCIUM GLUCONATE-NACL 1-0.675 GM/50ML-% IV SOLN
1.0000 g | Freq: Once | INTRAVENOUS | Status: AC
  Administered 2020-05-19: 1000 mg via INTRAVENOUS
  Filled 2020-05-19: qty 50

## 2020-05-19 MED ORDER — ROSUVASTATIN CALCIUM 20 MG PO TABS
20.0000 mg | ORAL_TABLET | Freq: Every day | ORAL | Status: DC
  Administered 2020-05-19 – 2020-05-22 (×4): 20 mg
  Filled 2020-05-19 (×4): qty 1

## 2020-05-19 MED ORDER — SODIUM BICARBONATE 8.4 % IV SOLN
INTRAVENOUS | Status: AC
  Filled 2020-05-19: qty 50

## 2020-05-19 MED ORDER — TRAMADOL HCL 50 MG PO TABS: 50.0000 mg | ORAL_TABLET | ORAL | Status: DC | PRN

## 2020-05-19 MED ORDER — POTASSIUM CHLORIDE 10 MEQ/50ML IV SOLN
10.0000 meq | INTRAVENOUS | Status: AC
  Administered 2020-05-19 (×4): 10 meq via INTRAVENOUS
  Filled 2020-05-19 (×4): qty 50

## 2020-05-19 MED ORDER — DOCUSATE SODIUM 50 MG/5ML PO LIQD
200.0000 mg | Freq: Every day | ORAL | Status: DC
  Administered 2020-05-19 – 2020-05-22 (×4): 200 mg
  Filled 2020-05-19 (×4): qty 20

## 2020-05-19 MED ORDER — FUROSEMIDE 10 MG/ML IJ SOLN
40.0000 mg | Freq: Once | INTRAMUSCULAR | Status: AC
  Administered 2020-05-19: 40 mg via INTRAVENOUS
  Filled 2020-05-19: qty 4

## 2020-05-19 NOTE — Progress Notes (Signed)
      [address]ates[address]       [place] [address]             [phone]     POD # 1 CABG  Intubated, sedated  BP (!) 151/41   Pulse 93   Temp (!) 97.3 F (36.3 C)   Resp 15   Ht 5\' 10"  (1.778 m)   Wt 117 kg   SpO2 97%   BMI 37.02 kg/m  26/11 CI = 1.4 On milrinone 0.5, Epi @ 7, norepi @ 9 Lasix drip @ 6 mg/hr UO 75-220 per hour  Intake/Output Summary (Last 24 hours) at 05/19/2020 1807 Last data filed at 05/19/2020 1800 Gross per 24 hour  Intake 6245.75 ml  Output 2850 ml  Net 3395.75 ml    K= 4.3, creatinine 1.03 Hct= 31  Persistent low index with co-ox indicating better perfusion than Swan Filling pressures low at present- will give albumin + Ca  Jalaiyah Throgmorton C. Roxan Hockey, MD [facility] [phone]

## 2020-05-19 NOTE — Anesthesia Postprocedure Evaluation (Signed)
Anesthesia Post Note  Patient: Eric Stokes  Procedure(s) Performed: CORONARY ARTERY BYPASS GRAFTING (CABG) TIMES FOUR USING INTERNAL BILATERAL MAMMARY ARTERIES AND HARVESTED LEFT RADIAL ARTERY. (N/A Chest) MAZE USING ATRICURE ISOLATOR CLAMP OLL2 (N/A Chest) RADIAL ARTERY HARVEST (Left Arm Lower) TRANSESOPHAGEAL ECHOCARDIOGRAM (TEE) (N/A ) INDOCYANINE GREEN FLUORESCENCE IMAGING (ICG) (N/A ) CLIPPING OF ATRIAL APPENDAGE USING ATRICURE 76 MM ATRICLIP (Left Chest)     Patient location during evaluation: SICU Anesthesia Type: General Level of consciousness: sedated Pain management: pain level controlled Vital Signs Assessment: post-procedure vital signs reviewed and stable Respiratory status: patient remains intubated per anesthesia plan Cardiovascular status: stable Postop Assessment: no apparent nausea or vomiting Anesthetic complications: no   No complications documented.  Last Vitals:  Vitals:   05/19/20 0900 05/19/20 0906  BP: (!) 129/49   Pulse: 92 92  Resp: 19 17  Temp: 36.7 C 36.7 C  SpO2: 97% 97%    Last Pain:  Vitals:   05/19/20 0800  TempSrc: Core  PainSc:                  Darica Goren S

## 2020-05-19 NOTE — Progress Notes (Signed)
1 Day Post-Op Procedure(s) (LRB): CORONARY ARTERY BYPASS GRAFTING (CABG) TIMES FOUR USING INTERNAL BILATERAL MAMMARY ARTERIES AND HARVESTED LEFT RADIAL ARTERY. (N/A) MAZE USING ATRICURE ISOLATOR CLAMP OLL2 (N/A) RADIAL ARTERY HARVEST (Left) TRANSESOPHAGEAL ECHOCARDIOGRAM (TEE) (N/A) INDOCYANINE GREEN FLUORESCENCE IMAGING (ICG) (N/A) CLIPPING OF ATRIAL APPENDAGE USING ATRICURE 45 MM ATRICLIP (Left) Subjective: Intubated, moving all 4 ext  Objective: Vital signs in last 24 hours: Temp:  [96.3 F (35.7 C)-98.2 F (36.8 C)] 98.1 F (36.7 C) (10/02 1000) Pulse Rate:  [89-95] 92 (10/02 1000) Cardiac Rhythm: Atrial paced;Atrial fibrillation (10/02 0800) Resp:  [12-28] 23 (10/02 1000) BP: (90-141)/(43-82) 141/53 (10/02 1000) SpO2:  [95 %-98 %] 97 % (10/02 1000) Arterial Line BP: (95-141)/(43-57) 141/53 (10/02 1000) FiO2 (%):  [50 %] 50 % (10/02 0848) Weight:  [694 kg] 117 kg (10/02 0715)  Hemodynamic parameters for last 24 hours: PAP: (34-51)/(16-31) 40/22 CVP:  [17 mmHg] 17 mmHg CO:  [2.6 L/min-4.5 L/min] 2.9 L/min CI:  [1.1 L/min/m2-2 L/min/m2] 1.3 L/min/m2  Intake/Output from previous day: 10/01 0701 - 10/02 0700 In: 9672.1 [I.V.:5568.2; Blood:1557; NG/GT:90; IV Piggyback:2456.9] Out: 4188 [Urine:1185; Blood:2363; Chest Tube:640] Intake/Output this shift: Total I/O In: 659.7 [I.V.:502.5; NG/GT:140; IV Piggyback:17.2] Out: 395 [Urine:255; Chest Tube:140]  General appearance: sedated Neurologic: nonfocal Heart: regular rate and rhythm Lungs: diminished breath sounds bibasilar Abdomen: normal findings: soft, non-tender Extremities: pulses intact  Lab Results: Recent Labs    05/18/20 2047 05/18/20 2047 05/19/20 0309 05/19/20 0318  WBC 11.0*  --  9.5  --   HGB 10.1*   < > 10.1* 10.2*  HCT 31.0*   < > 31.3* 30.0*  PLT 142*  --  134*  --    < > = values in this interval not displayed.   BMET:  Recent Labs    05/18/20 2047 05/18/20 2047 05/19/20 0309 05/19/20 0318   NA 136   < > 137 140  K 3.1*   < > 3.8 3.8  CL 107  --  107  --   CO2 17*  --  19*  --   GLUCOSE 169*  --  134*  --   BUN 15  --  17  --   CREATININE 1.15  --  1.15  --   CALCIUM 7.7*  --  8.5*  --    < > = values in this interval not displayed.    PT/INR:  Recent Labs    05/18/20 1507  LABPROT 21.1*  INR 1.9*   ABG    Component Value Date/Time   PHART 7.318 (L) 05/19/2020 0318   HCO3 18.7 (L) 05/19/2020 0318   TCO2 20 (L) 05/19/2020 0318   ACIDBASEDEF 7.0 (H) 05/19/2020 0318   O2SAT 57.3 05/19/2020 0924   CBG (last 3)  Recent Labs    05/19/20 0621 05/19/20 0750 05/19/20 0853  GLUCAP 98 126* 118*    Assessment/Plan: S/P Procedure(s) (LRB): CORONARY ARTERY BYPASS GRAFTING (CABG) TIMES FOUR USING INTERNAL BILATERAL MAMMARY ARTERIES AND HARVESTED LEFT RADIAL ARTERY. (N/A) MAZE USING ATRICURE ISOLATOR CLAMP OLL2 (N/A) RADIAL ARTERY HARVEST (Left) TRANSESOPHAGEAL ECHOCARDIOGRAM (TEE) (N/A) INDOCYANINE GREEN FLUORESCENCE IMAGING (ICG) (N/A) CLIPPING OF ATRIAL APPENDAGE USING ATRICURE 45 MM ATRICLIP (Left) POD # 1  CV- poor index despite multiple inotropes and IABP at 1:1   On Levophed, Epi, milrinone (increased to 0.5) and vasopressin  Co-ox is better than CI, but repeat down from 74 to 57  Agree with ECHO to assess LV function, r/o tamponade Keep CT in place RESP_ not ready  to extubate due to hemodynamic status RENAL- creatinine OK, metabolic acidosis- give bicarb ENDO- CBG controlled with insulin drip Anemia secondary to ABL- Hct 30 stable   LOS: 3 days    Melrose Nakayama 05/19/2020

## 2020-05-19 NOTE — Progress Notes (Addendum)
Patient ID: Eric Stokes, male   DOB: 06-14-1946, 74 y.o.   MRN: [id number]     Advanced Heart Failure Rounding Note  PCP-Cardiologist: No primary care provider on file.   Subjective:    Taken to OR 10/1 for CABG + MAZE and LAA clip.   Intubated and sedated.   On IABP 1:1 + milrinone 0.375, Epi 7, NE 9, VP 0.04. On amio gtt 63.  He is a-paced.     Swan #s CI 1.3 PAP 37/21 CVP 20 Co-ox 74%     Objective:   Weight Range: 117 kg Body mass index is 37.02 kg/m.   Vital Signs:   Temp:  [96.3 F (35.7 C)-98.2 F (36.8 C)] 98.1 F (36.7 C) (10/02 0906) Pulse Rate:  [89-95] 92 (10/02 0906) Resp:  [12-28] 17 (10/02 0906) BP: (90-130)/(43-82) 129/49 (10/02 0900) SpO2:  [95 %-98 %] 97 % (10/02 0906) Arterial Line BP: (95-135)/(43-57) 126/51 (10/02 0906) FiO2 (%):  [50 %] 50 % (10/02 0848) Weight:  [742 kg] 117 kg (10/02 0715)    Weight change: Filed Weights   05/16/20 1200 05/18/20 0500 05/19/20 0715  Weight: 108.9 kg 108.2 kg 117 kg    Intake/Output:   Intake/Output Summary (Last 24 hours) at 05/19/2020 5956 Last data filed at 05/19/2020 0900 Gross per 24 hour  Intake 9659.34 ml  Output 4413 ml  Net 5246.34 ml      Physical Exam    General: Sedated on vent Neck: Thick, JVP 16+, no thyromegaly or thyroid nodule.  Lungs: Decreased at bases.  CV: Nondisplaced PMI.  Heart regular S1/S2, no S3/S4, no murmur.  1+ edema to knees.  Abdomen: Soft, nontender, no hepatosplenomegaly, no distention.  Skin: Intact without lesions or rashes.  Neurologic: Sedated Extremities: No clubbing or cyanosis.  HEENT: Normal.    Telemetry   A paced 80s (personally reviewed)   Labs    CBC Recent Labs    05/18/20 2047 05/18/20 2047 05/19/20 0309 05/19/20 0318  WBC 11.0*  --  9.5  --   NEUTROABS 9.6*  --   --   --   HGB 10.1*   < > 10.1* 10.2*  HCT 31.0*   < > 31.3* 30.0*  MCV 89.3  --  88.2  --   PLT 142*  --  134*  --    < > = values in this interval not  displayed.   Basic Metabolic Panel Recent Labs    05/18/20 2047 05/18/20 2047 05/19/20 0309 05/19/20 0318  NA 136   < > 137 140  K 3.1*   < > 3.8 3.8  CL 107  --  107  --   CO2 17*  --  19*  --   GLUCOSE 169*  --  134*  --   BUN 15  --  17  --   CREATININE 1.15  --  1.15  --   CALCIUM 7.7*  --  8.5*  --   MG 2.7*  --  2.3  --   PHOS 1.3*  --   --   --    < > = values in this interval not displayed.   Liver Function Tests Recent Labs    05/16/20 1150 05/18/20 2047  AST 35 57*  ALT 26 25  ALKPHOS 50 28*  BILITOT 1.9* 1.7*  PROT 6.9 5.2*  ALBUMIN 3.7 3.3*   No results for input(s): LIPASE, AMYLASE in the last 72 hours. Cardiac Enzymes No results for input(s): CKTOTAL,  CKMB, CKMBINDEX, TROPONINI in the last 72 hours.  BNP: BNP (last 3 results) Recent Labs    05/16/20 1155  BNP 415.7*    ProBNP (last 3 results) No results for input(s): PROBNP in the last 8760 hours.   D-Dimer No results for input(s): DDIMER in the last 72 hours. Hemoglobin A1C Recent Labs    05/18/20 0220  HGBA1C 5.3   Fasting Lipid Panel Recent Labs    05/17/20 0537  CHOL 123  HDL 32*  LDLCALC NOT CALCULATED  TRIG 71  CHOLHDL 3.8   Thyroid Function Tests Recent Labs    05/16/20 1525  TSH 1.825    Other results:   Imaging    DG Chest Port 1 View  Result Date: 05/19/2020 CLINICAL DATA:  Shortness of breath EXAM: PORTABLE CHEST 1 VIEW COMPARISON:  May 18, 2020 FINDINGS: The cardiomediastinal silhouette is unchanged and enlarged in contour.Status post median sternotomy. ETT tip terminates 5.7 Cm above the carina. Bilateral chest tubes. Mediastinal drain. IABP metallic marker terminates 3 cm below the aortic arch in the superior thoracic aorta. Enteric tube side port projects over the stomach. RIGHT IJ PA catheter tip terminates over the RIGHT lower lobar pulmonary artery. Trace RIGHT pleural effusion. Elevation of the RIGHT hemidiaphragm. No significant pneumothorax. Mild  interstitial prominence likely reflecting pulmonary edema. Scattered linear opacities consistent with atelectasis. Visualized abdomen is unremarkable. Multilevel degenerative changes of the thoracic spine. IMPRESSION: 1.  Support apparatus as described above. 2. Mild pulmonary edema with trace RIGHT pleural effusion and scattered atelectasis. Electronically Signed   By: Valentino Saxon MD   On: 05/19/2020 09:06   DG Chest Port 1 View  Result Date: 05/18/2020 CLINICAL DATA:  Postsurgery EXAM: PORTABLE CHEST 1 VIEW COMPARISON:  Comparison chest x-ray 05/16/2020, CT chest 05/16/2020 FINDINGS: Interval placement of multiple lines and tubes as follows: Endotracheal tube with tip projecting approximately 3.5 cm above the carina. Enteric tube noted coursing below the diaphragm with tip collimated off view and side port likely distal to the gastroesophageal junction. Right internal jugular venous approach Swan-Ganz catheter with tip terminating in the distal right main pulmonary artery. Inferior approach bilateral chest tubes overlie the hemi-thoraces. Inferior approach mediastinal drain overlies the mediastinum. Cardiac pacing wires overlie the lower mediastinum. Interval placement of sternotomy wires that appear intact. Vascular clips overlie the mediastinum. Cardiomediastinal silhouette is prominent likely due to AP portable view and low lung volumes. Low lung volumes with bibasilar compressive changes. No focal consolidation. No pulmonary edema. No pleural effusion. No significant pneumothorax identified. No acute osseous abnormality. Degenerative changes of the right shoulder. IMPRESSION: 1. Right internal jugular venous approach Swan-Ganz catheter with tip terminating in the distal right main pulmonary artery. Consider retracting by 1-2 cm. The remaining new lines and tubes appear to be in good position. 2. Low lung volumes with no acute cardiopulmonary abnormality. Electronically Signed   By: Iven Finn  M.D.   On: 05/18/2020 15:20     Medications:     Scheduled Medications: . acetaminophen  1,000 mg Oral Q6H   Or  . acetaminophen (TYLENOL) oral liquid 160 mg/5 mL  1,000 mg Per Tube Q6H  . aspirin EC  325 mg Oral Daily   Or  . aspirin  324 mg Per Tube Daily  . bisacodyl  10 mg Oral Daily   Or  . bisacodyl  10 mg Rectal Daily  . chlorhexidine gluconate (MEDLINE KIT)  15 mL Mouth Rinse BID  . Chlorhexidine Gluconate Cloth  6  each Topical Daily  . docusate  200 mg Per Tube Daily  . furosemide  40 mg Intravenous Once  . latanoprost  1 drop Both Eyes QHS  . mouth rinse  15 mL Mouth Rinse 10 times per day  . metoprolol tartrate  12.5 mg Oral BID   Or  . metoprolol tartrate  12.5 mg Per Tube BID  . mupirocin ointment  1 application Nasal BID  . pantoprazole sodium  40 mg Per Tube QHS  . rosuvastatin  20 mg Per Tube Daily  . sodium chloride flush  3 mL Intravenous Q12H    Infusions: . sodium chloride 20 mL/hr at 05/19/20 0900  . sodium chloride    . sodium chloride    . albumin human Stopped (05/19/20 0525)  . amiodarone 30 mg/hr (05/19/20 0900)  . cefUROXime (ZINACEF)  IV Stopped (05/19/20 0449)  . dexmedetomidine (PRECEDEX) IV infusion 1 mcg/kg/hr (05/19/20 0900)  . epinephrine 7 mcg/min (05/19/20 0900)  . furosemide (LASIX) infusion    . insulin 3 mL/hr at 05/19/20 0900  . lactated ringers    . lactated ringers 20 mL/hr at 05/19/20 0900  . milrinone 0.375 mcg/kg/min (05/19/20 0900)  . nitroGLYCERIN 5 mcg/min (05/19/20 0900)  . norepinephrine (LEVOPHED) Adult infusion 7 mcg/min (05/19/20 0900)  . phenylephrine (NEO-SYNEPHRINE) Adult infusion Stopped (05/18/20 1604)  . potassium chloride    . vasopressin 0.04 Units/min (05/19/20 0900)    PRN Medications: sodium chloride, albumin human, dextrose, metoprolol tartrate, midazolam, morphine injection, ondansetron (ZOFRAN) IV, oxyCODONE, sodium chloride flush, sodium chloride flush, traMADol    Assessment/Plan   1.  CAD/NSTEMI with critcal LM disease - HS Trop [phone]. LHC with severe multivessel disease.  - S/p CABG 10/1 - ASA per tube, add Crestor 20 (myalgias with atorvastatin).   2. Acute Systolic Heart Failure>>Cardiogenic Shock  - ECHO in 2020 showed EF 60-65%.  - ECHO this admit showed EF is down to 25-30%. RV normal.  - Ischemic CMP. Cath this admit w/ severe MVCAD w/ critical LM disease, s/p CABG as above.  - This morning, CVP 20.  Discrepant results between CI which is consistently low (1.3) and co-ox 74%, creatinine is stable. Repeat co-ox now and send lactate.  MAP stable.  - Epinephrine ongoing at 7 with NE 9 and vasopressin 0.04 with SBP 130s.  Can wean vasopressin today but would leave NE and epinephrine for now.  - Increase milrinone to 0.5 and continue IABP 1:1.  - Will need diuresis, will give Lasix 40 mg IV x 1 with Lasix gtt 6 mg/hr.  - Echo to rule out post-op complications (effusion, etc).   3. A fib RVR  - s/p MAZE w/ LAA Clip  - continue amio gtt at 30 mg/hr  - monitor on tele, currently a-paced.   4. H/o Prostate Cancer - coudet catheter in place due to massive prostate enlargement   Length of Stay: 3  CRITICAL CARE Performed by: Loralie Champagne  Total critical care time: 40 minutes  Critical care time was exclusive of separately billable procedures and treating other patients.  Critical care was necessary to treat or prevent imminent or life-threatening deterioration.  Critical care was time spent personally by me (independent of midlevel providers or residents) on the following activities: development of treatment plan with patient and/or surrogate as well as nursing, discussions with consultants, evaluation of patient's response to treatment, examination of patient, obtaining history from patient or surrogate, ordering and performing treatments and interventions, ordering and review of laboratory  studies, ordering and review of radiographic studies, pulse  oximetry and re-evaluation of patient's condition.  Loralie Champagne, MD  9:22 AM

## 2020-05-19 NOTE — Progress Notes (Signed)
  Echocardiogram 2D Echocardiogram has been performed.  Eric Stokes 05/19/2020, 4:26 PM

## 2020-05-20 ENCOUNTER — Inpatient Hospital Stay (HOSPITAL_COMMUNITY): Payer: No Typology Code available for payment source

## 2020-05-20 LAB — CBC
HCT: 30.1 % — ABNORMAL LOW (ref 39.0–52.0)
HCT: 30.7 % — ABNORMAL LOW (ref 39.0–52.0)
Hemoglobin: 9.9 g/dL — ABNORMAL LOW (ref 13.0–17.0)
Hemoglobin: 10.3 g/dL — ABNORMAL LOW (ref 13.0–17.0)
MCH: 28.7 pg (ref 26.0–34.0)
MCH: 29.5 pg (ref 26.0–34.0)
MCHC: 32.9 g/dL (ref 30.0–36.0)
MCHC: 33.6 g/dL (ref 30.0–36.0)
MCV: 87.2 fL (ref 80.0–100.0)
MCV: 88 fL (ref 80.0–100.0)
Platelets: 96 10*3/uL — ABNORMAL LOW (ref 150–400)
Platelets: 87 10*3/uL — ABNORMAL LOW (ref 150–400)
RBC: 3.45 MIL/uL — ABNORMAL LOW (ref 4.22–5.81)
RBC: 3.49 MIL/uL — ABNORMAL LOW (ref 4.22–5.81)
RDW: 14.3 % (ref 11.5–15.5)
RDW: 14.2 % (ref 11.5–15.5)
WBC: 9.8 10*3/uL (ref 4.0–10.5)
WBC: 6.9 10*3/uL (ref 4.0–10.5)
nRBC: 0 % (ref 0.0–0.2)
nRBC: 0 % (ref 0.0–0.2)

## 2020-05-20 LAB — POCT I-STAT 7, (LYTES, BLD GAS, ICA,H+H)
Acid-Base Excess: 0 mmol/L (ref 0.0–2.0)
Bicarbonate: 24.4 mmol/L (ref 20.0–28.0)
Calcium, Ion: 1.18 mmol/L (ref 1.15–1.40)
HCT: 28 % — ABNORMAL LOW (ref 39.0–52.0)
Hemoglobin: 9.5 g/dL — ABNORMAL LOW (ref 13.0–17.0)
O2 Saturation: 97 %
Patient temperature: 36.8
Potassium: 3.6 mmol/L (ref 3.5–5.1)
Sodium: 140 mmol/L (ref 135–145)
TCO2: 26 mmol/L (ref 22–32)
pCO2 arterial: 39.8 mmHg (ref 32.0–48.0)
pH, Arterial: 7.395 (ref 7.350–7.450)
pO2, Arterial: 91 mmHg (ref 83.0–108.0)

## 2020-05-20 LAB — GLUCOSE, CAPILLARY
Glucose-Capillary: 107 mg/dL — ABNORMAL HIGH (ref 70–99)
Glucose-Capillary: 106 mg/dL — ABNORMAL HIGH (ref 70–99)
Glucose-Capillary: 99 mg/dL (ref 70–99)
Glucose-Capillary: 101 mg/dL — ABNORMAL HIGH (ref 70–99)
Glucose-Capillary: 89 mg/dL (ref 70–99)
Glucose-Capillary: 116 mg/dL — ABNORMAL HIGH (ref 70–99)
Glucose-Capillary: 115 mg/dL — ABNORMAL HIGH (ref 70–99)
Glucose-Capillary: 127 mg/dL — ABNORMAL HIGH (ref 70–99)
Glucose-Capillary: 128 mg/dL — ABNORMAL HIGH (ref 70–99)

## 2020-05-20 LAB — COOXEMETRY PANEL
Carboxyhemoglobin: 1.1 % (ref 0.5–1.5)
Carboxyhemoglobin: 1 % (ref 0.5–1.5)
Carboxyhemoglobin: 0.9 % (ref 0.5–1.5)
Methemoglobin: 0.9 % (ref 0.0–1.5)
Methemoglobin: 1 % (ref 0.0–1.5)
Methemoglobin: 0.6 % (ref 0.0–1.5)
O2 Saturation: 67.2 %
O2 Saturation: 51.7 %
O2 Saturation: 61.3 %
Total hemoglobin: 9.2 g/dL — ABNORMAL LOW (ref 12.0–16.0)
Total hemoglobin: 10.7 g/dL — ABNORMAL LOW (ref 12.0–16.0)
Total hemoglobin: 10.6 g/dL — ABNORMAL LOW (ref 12.0–16.0)

## 2020-05-20 LAB — MAGNESIUM: Magnesium: 1.8 mg/dL (ref 1.7–2.4)

## 2020-05-20 LAB — BASIC METABOLIC PANEL
Anion gap: 9 (ref 5–15)
Anion gap: 8 (ref 5–15)
BUN: 13 mg/dL (ref 8–23)
BUN: 12 mg/dL (ref 8–23)
CO2: 24 mmol/L (ref 22–32)
CO2: 28 mmol/L (ref 22–32)
Calcium: 8 mg/dL — ABNORMAL LOW (ref 8.9–10.3)
Calcium: 8 mg/dL — ABNORMAL LOW (ref 8.9–10.3)
Chloride: 105 mmol/L (ref 98–111)
Chloride: 101 mmol/L (ref 98–111)
Creatinine, Ser: 0.97 mg/dL (ref 0.61–1.24)
Creatinine, Ser: 0.9 mg/dL (ref 0.61–1.24)
GFR calc Af Amer: >60 mL/min (ref >60)
GFR calc Af Amer: >60 mL/min (ref >60)
GFR calc non Af Amer: >60 mL/min (ref >60)
GFR calc non Af Amer: >60 mL/min (ref >60)
Glucose, Bld: 96 mg/dL (ref 70–99)
Glucose, Bld: 127 mg/dL — ABNORMAL HIGH (ref 70–99)
Potassium: 3.7 mmol/L (ref 3.5–5.1)
Potassium: 4 mmol/L (ref 3.5–5.1)
Sodium: 138 mmol/L (ref 135–145)
Sodium: 137 mmol/L (ref 135–145)

## 2020-05-20 MED ORDER — POTASSIUM CHLORIDE 10 MEQ/50ML IV SOLN
10.0000 meq | INTRAVENOUS | Status: AC
  Administered 2020-05-20 (×3): 10 meq via INTRAVENOUS
  Filled 2020-05-20 (×3): qty 50

## 2020-05-20 MED ORDER — ALBUMIN HUMAN 5 % IV SOLN: 12.5000 g | Freq: Once | INTRAVENOUS | Status: AC

## 2020-05-20 MED ORDER — INSULIN ASPART 100 UNIT/ML ~~LOC~~ SOLN
0.0000 [IU] | SUBCUTANEOUS | Status: DC
  Administered 2020-05-20 – 2020-05-30 (×29): 2 [IU] via SUBCUTANEOUS

## 2020-05-20 MED ORDER — ALBUMIN HUMAN 5 % IV SOLN
INTRAVENOUS | Status: AC
  Administered 2020-05-20: 12.5 g via INTRAVENOUS
  Filled 2020-05-20: qty 250

## 2020-05-20 NOTE — Progress Notes (Signed)
Patient ID: Eric Stokes, male   DOB: [birth year], 74 y.o.   MRN: [id number] P    Advanced Heart Failure Rounding Note  PCP-Cardiologist: No primary care provider on file.   Subjective:    Taken to OR 10/1 for CABG + MAZE and LAA clip.   Intubated and sedated.   On IABP 1:1 + milrinone 0.5, Epi 6, NE 7.  On amio gtt 5.  He is in NSR.  On Lasix gtt 6 mg/hr, I/Os even.  CVP 13. BP stable. Lactate on 10/2 1.8 => 1.7.  Creatinine stable.   Swan #s CI 1.5 PAP 30/17 CVP 13-14 Co-ox 67%     Objective:   Weight Range: 117 kg Body mass index is 37.02 kg/m.   Vital Signs:   Temp:  [97 F (36.1 C)-99.7 F (37.6 C)] 99.7 F (37.6 C) (10/03 0945) Pulse Rate:  [91-101] 98 (10/03 0945) Resp:  [13-30] 29 (10/03 0945) BP: (106-159)/(41-75) 142/60 (10/03 0900) SpO2:  [92 %-100 %] 99 % (10/03 0945) Arterial Line BP: (114-166)/(40-71) 153/61 (10/03 0945) FiO2 (%):  [50 %] 50 % (10/03 0838)    Weight change: Filed Weights   05/16/20 1200 05/18/20 0500 05/19/20 0715  Weight: 108.9 kg 108.2 kg 117 kg    Intake/Output:   Intake/Output Summary (Last 24 hours) at 05/20/2020 0957 Last data filed at 05/20/2020 0913 Gross per 24 hour  Intake 5032.85 ml  Output 5650 ml  Net -617.15 ml      Physical Exam    General: Intubated/sedated.  Neck: Thick, JVP 14 cm, no thyromegaly or thyroid nodule.  Lungs: Rhonchi bilaterally.  CV: Nondisplaced PMI.  Heart regular S1/S2, no S3/S4, no murmur.  No peripheral edema.   Abdomen: Soft, nontender, no hepatosplenomegaly, no distention.  Skin: Intact without lesions or rashes.  Neurologic: Sedated on vent.  Extremities: No clubbing or cyanosis.  HEENT: Normal.    Telemetry   NSR 80s (personally reviewed)   Labs    CBC Recent Labs    05/18/20 2047 05/19/20 0309 05/19/20 1636 05/19/20 2131 05/20/20 0314 05/20/20 0316  WBC 11.0*   < > 10.4  --   --  9.8  NEUTROABS 9.6*  --   --   --   --   --   HGB 10.1*   < > 10.3*   < >  9.5* 9.9*  HCT 31.0*   < > 30.9*   < > 28.0* 30.1*  MCV 89.3   < > 87.3  --   --  87.2  PLT 142*   < > 104*  --   --  96*   < > = values in this interval not displayed.   Basic Metabolic Panel Recent Labs    05/18/20 2047 05/18/20 2047 05/19/20 0309 05/19/20 0318 05/19/20 1636 05/19/20 2131 05/20/20 0314 05/20/20 0316  NA 136   < > 137   < > 138   < > 140 138  K 3.1*   < > 3.8   < > 4.3   < > 3.6 3.7  CL 107   < > 107   < > 109  --   --  105  CO2 17*   < > 19*   < > 22  --   --  24  GLUCOSE 169*   < > 134*   < > 109*  --   --  96  BUN 15   < > 17   < > 15  --   --  13  CREATININE 1.15   < > 1.15   < > 1.03  --   --  0.97  CALCIUM 7.7*   < > 8.5*   < > 7.8*  --   --  8.0*  MG 2.7*   < > 2.3  --  2.2  --   --   --   PHOS 1.3*  --   --   --   --   --   --   --    < > = values in this interval not displayed.   Liver Function Tests Recent Labs    05/18/20 2047  AST 57*  ALT 25  ALKPHOS 28*  BILITOT 1.7*  PROT 5.2*  ALBUMIN 3.3*   No results for input(s): LIPASE, AMYLASE in the last 72 hours. Cardiac Enzymes No results for input(s): CKTOTAL, CKMB, CKMBINDEX, TROPONINI in the last 72 hours.  BNP: BNP (last 3 results) Recent Labs    05/16/20 1155  BNP 415.7*    ProBNP (last 3 results) No results for input(s): PROBNP in the last 8760 hours.   D-Dimer No results for input(s): DDIMER in the last 72 hours. Hemoglobin A1C Recent Labs    05/18/20 0220  HGBA1C 5.3   Fasting Lipid Panel No results for input(s): CHOL, HDL, LDLCALC, TRIG, CHOLHDL, LDLDIRECT in the last 72 hours. Thyroid Function Tests No results for input(s): TSH, T4TOTAL, T3FREE, THYROIDAB in the last 72 hours.  Invalid input(s): FREET3  Other results:   Imaging    DG Chest Port 1 View  Result Date: 05/20/2020 CLINICAL DATA:  Status post CABG. EXAM: PORTABLE CHEST 1 VIEW COMPARISON:  05/19/2020 FINDINGS: The patient is mildly rotated to the right. Sequelae of CABG are again identified.  Endotracheal tube terminates at the clavicular heads. Enteric tube terminates in the left upper abdomen in the expected region of the stomach. A right jugular Swan-Ganz catheter has been retracted and terminates over the proximal right main pulmonary artery. Bilateral chest tubes and a mediastinal drain remain in place. The cardiomediastinal silhouette is unchanged with cardiomegaly again noted. Lung volumes remain mildly low with similar appearance of mild bilateral interstitial opacities. A small right pleural effusion is unchanged. No pneumothorax is identified. IMPRESSION: 1. Support devices as above. 2. Unchanged small right pleural effusion and suspected mild interstitial edema. Electronically Signed   By: Logan Bores M.D.   On: 05/20/2020 08:21   ECHOCARDIOGRAM LIMITED  Result Date: 05/19/2020    ECHOCARDIOGRAM LIMITED REPORT   Patient Name:   Eric Stokes Date of Exam: 05/19/2020 Medical Rec #:  [id number]          Height:       70.0 in Accession #:    [id number]         Weight:       258.0 lb Date of Birth:  01/22/1946         BSA:          2.326 m Patient Age:    74 years           BP:           137/52 mmHg Patient Gender: M                  HR:           93 bpm. Exam Location:  Inpatient Procedure: Limited Echo, Color Doppler and Intracardiac Opacification Agent Indications:    Cardiomyopathy-Ischemic  History:  Patient has prior history of Echocardiogram examinations, most                 recent 05/17/2020. CHF, CAD, COPD, Aortic Valve Disease,                 Arrythmias:Atrial Fibrillation; Risk Factors:Hypertension,                 Former Smoker and Dyslipidemia. TIA.  Sonographer:    Clayton Lefort RDCS (AE) Referring Phys: [id number] [facility]  Sonographer Comments: Suboptimal apical window and echo performed with patient supine and on artificial respirator. No subcostal images due to bandages in subcostal area. IMPRESSIONS  1. Left ventricular ejection fraction, by estimation, is 25%. The  left ventricle has severely decreased function.  2. Right ventricular systolic function is normal. The right ventricular size is normal.  3. The aortic valve has an indeterminant number of cusps. There is moderate calcification of the aortic valve. There is moderate thickening of the aortic valve.  4. Limited echo to evaluate LV function FINDINGS  Left Ventricle: The anteroseptal wall is akinetic. Anterolateral wall,entire apex, mid to distal anterior walls are hypokinetic. Left ventricular ejection fraction, by estimation, is 25%. The left ventricle has severely decreased function. Definity contrast agent was given IV to delineate the left ventricular endocardial borders. Right Ventricle: The right ventricular size is normal. No increase in right ventricular wall thickness. Right ventricular systolic function is normal. Pericardium: There is no evidence of pericardial effusion. Aortic Valve: The aortic valve has an indeterminant number of cusps. There is moderate calcification of the aortic valve. There is moderate thickening of the aortic valve. There is moderate aortic valve annular calcification. Pulmonic Valve: The pulmonic valve was not well visualized. Pulmonic valve regurgitation is not visualized. No evidence of pulmonic stenosis. Aorta: The aortic root is normal in size and structure. Additional Comments: A venous catheter is visualized. LEFT VENTRICLE PLAX 2D LVIDd:         4.40 cm LVIDs:         3.50 cm LV PW:         1.50 cm LV IVS:        1.60 cm LVOT diam:     2.00 cm LVOT Area:     3.14 cm  LEFT ATRIUM         Index LA diam:    4.60 cm 1.98 cm/m   AORTA Ao Root diam: 3.20 cm  SHUNTS Systemic Diam: 2.00 cm Carlyle Dolly MD Electronically signed by Carlyle Dolly MD Signature Date/Time: 05/19/2020/4:34:36 PM    Final      Medications:     Scheduled Medications: . acetaminophen  1,000 mg Oral Q6H   Or  . acetaminophen (TYLENOL) oral liquid 160 mg/5 mL  1,000 mg Per Tube Q6H  . aspirin EC   325 mg Oral Daily   Or  . aspirin  324 mg Per Tube Daily  . bisacodyl  10 mg Oral Daily   Or  . bisacodyl  10 mg Rectal Daily  . chlorhexidine gluconate (MEDLINE KIT)  15 mL Mouth Rinse BID  . Chlorhexidine Gluconate Cloth  6 each Topical Daily  . docusate  200 mg Per Tube Daily  . insulin aspart  0-15 Units Subcutaneous Q4H  . latanoprost  1 drop Both Eyes QHS  . mouth rinse  15 mL Mouth Rinse 10 times per day  . mupirocin ointment  1 application Nasal BID  . pantoprazole sodium  40  mg Per Tube QHS  . rosuvastatin  20 mg Per Tube Daily  . sodium chloride flush  3 mL Intravenous Q12H    Infusions: . sodium chloride Stopped (05/20/20 0815)  . sodium chloride    . sodium chloride    . amiodarone 30 mg/hr (05/20/20 0900)  . dexmedetomidine (PRECEDEX) IV infusion 1.2 mcg/kg/hr (05/20/20 0918)  . epinephrine 6 mcg/min (05/20/20 0900)  . furosemide (LASIX) infusion 6 mg/hr (05/20/20 0900)  . lactated ringers    . lactated ringers 20 mL/hr at 05/20/20 0900  . milrinone 0.5 mcg/kg/min (05/20/20 0900)  . nitroGLYCERIN 8 mcg/min (05/20/20 0900)  . norepinephrine (LEVOPHED) Adult infusion 7 mcg/min (05/20/20 0900)  . phenylephrine (NEO-SYNEPHRINE) Adult infusion Stopped (05/18/20 1604)  . potassium chloride    . vasopressin Stopped (05/19/20 0941)    PRN Medications: sodium chloride, metoprolol tartrate, midazolam, morphine injection, ondansetron (ZOFRAN) IV, oxyCODONE, sodium chloride flush, sodium chloride flush, traMADol    Assessment/Plan   1. CAD/NSTEMI with critcal LM disease - HS Trop [phone]. LHC with severe multivessel disease.  - S/p CABG 10/1 - ASA per tube, Crestor 20 (myalgias with atorvastatin).   2. Acute Systolic Heart Failure>>Cardiogenic Shock  - ECHO in 2020 showed EF 60-65%.  - ECHO this admit showed EF is down to 25-30%. RV normal.  Repeat echo post-op on 10/2 showed EF 25%, normal RV, no pericardial effusion.  - Ischemic CMP. Cath this admit w/  severe MVCAD w/ critical LM disease, s/p CABG as above.  - This morning, CVP 13-14.  Discrepant results between CI which is consistently low (1.5) and co-ox 67%, creatinine is stable and lactate has been normal.  For now, we will follow the co-ox.  MAP stable.  - Wean epinephrine slowly today and decrease milrinone to 0.375.  If he is tolerating lower support tomorrow (ideally just milrinone and NE), will start IABP wean tomorrow.  - Increase Lasix gtt to 10 mg/hr. Replace K.   3. A fib RVR  - s/p MAZE w/ LAA Clip  - continue amio gtt at 30 mg/hr  - monitor on tele, currently NSR.  - Will need eventual anticoagulation.   4. H/o Prostate Cancer - coudet catheter in place due to massive prostate enlargement   Length of Stay: 4  CRITICAL CARE Performed by: Loralie Champagne  Total critical care time: 40 minutes  Critical care time was exclusive of separately billable procedures and treating other patients.  Critical care was necessary to treat or prevent imminent or life-threatening deterioration.  Critical care was time spent personally by me (independent of midlevel providers or residents) on the following activities: development of treatment plan with patient and/or surrogate as well as nursing, discussions with consultants, evaluation of patient's response to treatment, examination of patient, obtaining history from patient or surrogate, ordering and performing treatments and interventions, ordering and review of laboratory studies, ordering and review of radiographic studies, pulse oximetry and re-evaluation of patient's condition.  Loralie Champagne, MD  9:57 AM

## 2020-05-20 NOTE — Progress Notes (Signed)
Albumin started by day RN completed, ran another CI/CO, worsened from before. Notified Dr. Roxan Hockey and Dr. Aundra Dubin, who were on the unit and came to bedside to assess. Orders placed to increase milrinone back up to 0.5 mcg/kg/min, restart Epi at 2 mcg/min, and draw a coox in 30 minutes. Also ordered to pause lasix gtt for 4 hours, then restart at 6mg /hr.   Will continue to monitor closely.

## 2020-05-20 NOTE — Plan of Care (Signed)
  Problem: Education: Goal: Knowledge of General Education information will improve Description: Including pain rating scale, medication(s)/side effects and non-pharmacologic comfort measures Outcome: Progressing   Problem: Health Behavior/Discharge Planning: Goal: Ability to manage health-related needs will improve Outcome: Progressing   Problem: Clinical Measurements: Goal: Ability to maintain clinical measurements within normal limits will improve Outcome: Progressing Goal: Will remain free from infection Outcome: Progressing Goal: Diagnostic test results will improve Outcome: Progressing Goal: Respiratory complications will improve Outcome: Progressing Goal: Cardiovascular complication will be avoided Outcome: Progressing   Problem: Activity: Goal: Risk for activity intolerance will decrease Outcome: Progressing   Problem: Nutrition: Goal: Adequate nutrition will be maintained Outcome: Progressing   Problem: Coping: Goal: Level of anxiety will decrease Outcome: Progressing   Problem: Elimination: Goal: Will not experience complications related to bowel motility Outcome: Progressing Goal: Will not experience complications related to urinary retention Outcome: Progressing   Problem: Pain Managment: Goal: General experience of comfort will improve Outcome: Progressing   Problem: Safety: Goal: Ability to remain free from injury will improve Outcome: Progressing   Problem: Skin Integrity: Goal: Risk for impaired skin integrity will decrease Outcome: Progressing   Problem: Education: Goal: Will demonstrate proper wound care and an understanding of methods to prevent future damage Outcome: Progressing Goal: Knowledge of disease or condition will improve Outcome: Progressing Goal: Knowledge of the prescribed therapeutic regimen will improve Outcome: Progressing Goal: Individualized Educational Video(s) Outcome: Progressing   Problem: Activity: Goal: Risk for  activity intolerance will decrease Outcome: Progressing   Problem: Cardiac: Goal: Will achieve and/or maintain hemodynamic stability Outcome: Progressing   Problem: Clinical Measurements: Goal: Postoperative complications will be avoided or minimized Outcome: Progressing   Problem: Respiratory: Goal: Respiratory status will improve Outcome: Progressing   Problem: Skin Integrity: Goal: Wound healing without signs and symptoms of infection Outcome: Progressing Goal: Risk for impaired skin integrity will decrease Outcome: Progressing   Problem: Urinary Elimination: Goal: Ability to achieve and maintain adequate renal perfusion and functioning will improve Outcome: Progressing   Problem: Activity: Goal: Ability to tolerate increased activity will improve Outcome: Progressing   Problem: Respiratory: Goal: Ability to maintain a clear airway and adequate ventilation will improve Outcome: Progressing   Problem: Role Relationship: Goal: Method of communication will improve Outcome: Progressing   

## 2020-05-20 NOTE — Progress Notes (Addendum)
2 Days Post-Op Procedure(s) (LRB): CORONARY ARTERY BYPASS GRAFTING (CABG) TIMES FOUR USING INTERNAL BILATERAL MAMMARY ARTERIES AND HARVESTED LEFT RADIAL ARTERY. (N/A) MAZE USING ATRICURE ISOLATOR CLAMP OLL2 (N/A) RADIAL ARTERY HARVEST (Left) TRANSESOPHAGEAL ECHOCARDIOGRAM (TEE) (N/A) INDOCYANINE GREEN FLUORESCENCE IMAGING (ICG) (N/A) CLIPPING OF ATRIAL APPENDAGE USING ATRICURE 45 MM ATRICLIP (Left) Subjective: Intubated, sedated  Objective: Vital signs in last 24 hours: Temp:  [97 F (36.1 C)-99.5 F (37.5 C)] 99.5 F (37.5 C) (10/03 0918) Pulse Rate:  [91-101] 99 (10/03 0918) Cardiac Rhythm: Atrial paced;Atrial fibrillation (10/03 0800) Resp:  [13-30] 17 (10/03 0918) BP: (106-159)/(41-75) 142/60 (10/03 0900) SpO2:  [92 %-100 %] 99 % (10/03 0918) Arterial Line BP: (114-166)/(40-71) 143/60 (10/03 0918) FiO2 (%):  [50 %] 50 % (10/03 0838)  Hemodynamic parameters for last 24 hours: PAP: (24-43)/(9-28) 30/17 CVP:  [7 mmHg-18 mmHg] 10 mmHg CO:  [3 L/min-3.5 L/min] 3.5 L/min CI:  [1.3 L/min/m2-1.5 L/min/m2] 1.5 L/min/m2  Intake/Output from previous day: 10/02 0701 - 10/03 0700 In: 5101.7 [I.V.:3898; NG/GT:370; IV Piggyback:833.8] Out: 5115 [Urine:3705; Emesis/NG output:450; Chest Tube:960] Intake/Output this shift: Total I/O In: 413.4 [I.V.:298.1; NG/GT:80; IV Piggyback:35.3] Out: 810 [Urine:700; Chest Tube:110]  General appearance: intubated, cooperative Heart: regular rate and rhythm Lungs: rhonchi bilaterally Abdomen: nondistended. + BS Extremities: cool, 2+ pulses bilaterally  Lab Results: Recent Labs    05/19/20 1636 05/19/20 2131 05/20/20 0314 05/20/20 0316  WBC 10.4  --   --  9.8  HGB 10.3*   < > 9.5* 9.9*  HCT 30.9*   < > 28.0* 30.1*  PLT 104*  --   --  96*   < > = values in this interval not displayed.   BMET:  Recent Labs    05/19/20 1636 05/19/20 2131 05/20/20 0314 05/20/20 0316  NA 138   < > 140 138  K 4.3   < > 3.6 3.7  CL 109  --   --  105   CO2 22  --   --  24  GLUCOSE 109*  --   --  96  BUN 15  --   --  13  CREATININE 1.03  --   --  0.97  CALCIUM 7.8*  --   --  8.0*   < > = values in this interval not displayed.    PT/INR:  Recent Labs    05/18/20 1507  LABPROT 21.1*  INR 1.9*   ABG    Component Value Date/Time   PHART 7.395 05/20/2020 0314   HCO3 24.4 05/20/2020 0314   TCO2 26 05/20/2020 0314   ACIDBASEDEF 1.0 05/19/2020 2131   O2SAT 67.2 05/20/2020 0316   CBG (last 3)  Recent Labs    05/20/20 0312 05/20/20 0537 05/20/20 0643  GLUCAP 99 101* 89    Assessment/Plan: S/P Procedure(s) (LRB): CORONARY ARTERY BYPASS GRAFTING (CABG) TIMES FOUR USING INTERNAL BILATERAL MAMMARY ARTERIES AND HARVESTED LEFT RADIAL ARTERY. (N/A) MAZE USING ATRICURE ISOLATOR CLAMP OLL2 (N/A) RADIAL ARTERY HARVEST (Left) TRANSESOPHAGEAL ECHOCARDIOGRAM (TEE) (N/A) INDOCYANINE GREEN FLUORESCENCE IMAGING (ICG) (N/A) CLIPPING OF ATRIAL APPENDAGE USING ATRICURE 45 MM ATRICLIP (Left) -POD # 2 CV- Ci by Luiz Blare remains low although trending upward  Co-ox normal at 67  Not acidotic and creatinine stable  Will discuss IABP, inotrope management with AHF once they have rounded RESP- VDRF- lungs sound congested this AM  On IMV 12/50%/% PEEP with ABG 7.39/37/91/26 RENAL- creatinine stable, K= 3.7- supplemented  Diuresing with lasix drip ENDO_ CBG essentially normal Anemia secondary to ABL- stable Thrombocytopenia-  PLT down slightly to 96K, monitor    LOS: 4 days    Melrose Nakayama 05/20/2020 Discussed with Dr. Aundra Dubin We are in agreement that overall picture c/w good perfusion with normal co-ox and creatinine, no acidosis. Not sure why Swan index so low Will try to wean pressors today, continue diuresis Attempt to get IABP out and wean vent tomorrow  Remo Lipps C. Roxan Hockey, MD [facility] [phone]

## 2020-05-20 NOTE — Progress Notes (Signed)
      [address]       ,[place] [address]             [phone]      Intubated, sedated  BP (!) 144/38   Pulse 92   Temp 100.2 F (37.9 C)   Resp 19   Ht 5\' 10"  (1.778 m)   Wt 117 kg   SpO2 99%   BMI 37.02 kg/m  CI= 1.28 down from 1.54 Co-ox= 52 down form 67 Milrinone decreased and epi weaned off earlier Now on milrinone 0.375 and norepi 10 and IABP PA only 26/13 Will give albumin to increase preload May need to increase milrinone again 2L negative so far today with diuresis K= 4.0, creatinine 0.9 Hct 31  Eric Tetrick C. Roxan Hockey, MD [facility] [phone]

## 2020-05-21 ENCOUNTER — Encounter (HOSPITAL_COMMUNITY): Payer: Self-pay | Admitting: Cardiothoracic Surgery

## 2020-05-21 ENCOUNTER — Inpatient Hospital Stay (HOSPITAL_COMMUNITY): Payer: No Typology Code available for payment source

## 2020-05-21 LAB — POCT I-STAT 7, (LYTES, BLD GAS, ICA,H+H)
Acid-Base Excess: 5 mmol/L — ABNORMAL HIGH (ref 0.0–2.0)
Bicarbonate: 28.8 mmol/L — ABNORMAL HIGH (ref 20.0–28.0)
Calcium, Ion: 1.12 mmol/L — ABNORMAL LOW (ref 1.15–1.40)
HCT: 30 % — ABNORMAL LOW (ref 39.0–52.0)
Hemoglobin: 10.2 g/dL — ABNORMAL LOW (ref 13.0–17.0)
O2 Saturation: 99 %
Patient temperature: 37.4
Potassium: 3.5 mmol/L (ref 3.5–5.1)
Sodium: 137 mmol/L (ref 135–145)
TCO2: 30 mmol/L (ref 22–32)
pCO2 arterial: 39.5 mmHg (ref 32.0–48.0)
pH, Arterial: 7.472 — ABNORMAL HIGH (ref 7.350–7.450)
pO2, Arterial: 113 mmHg — ABNORMAL HIGH (ref 83.0–108.0)

## 2020-05-21 LAB — BASIC METABOLIC PANEL
Anion gap: 10 (ref 5–15)
Anion gap: 9 (ref 5–15)
BUN: 12 mg/dL (ref 8–23)
BUN: 11 mg/dL (ref 8–23)
CO2: 27 mmol/L (ref 22–32)
CO2: 27 mmol/L (ref 22–32)
Calcium: 7.8 mg/dL — ABNORMAL LOW (ref 8.9–10.3)
Calcium: 7.7 mg/dL — ABNORMAL LOW (ref 8.9–10.3)
Chloride: 99 mmol/L (ref 98–111)
Chloride: 97 mmol/L — ABNORMAL LOW (ref 98–111)
Creatinine, Ser: 0.81 mg/dL (ref 0.61–1.24)
Creatinine, Ser: 0.83 mg/dL (ref 0.61–1.24)
GFR calc Af Amer: >60 mL/min (ref >60)
GFR calc Af Amer: >60 mL/min (ref >60)
GFR calc non Af Amer: >60 mL/min (ref >60)
GFR calc non Af Amer: >60 mL/min (ref >60)
Glucose, Bld: 126 mg/dL — ABNORMAL HIGH (ref 70–99)
Glucose, Bld: 138 mg/dL — ABNORMAL HIGH (ref 70–99)
Potassium: 3.5 mmol/L (ref 3.5–5.1)
Potassium: 3.6 mmol/L (ref 3.5–5.1)
Sodium: 136 mmol/L (ref 135–145)
Sodium: 133 mmol/L — ABNORMAL LOW (ref 135–145)

## 2020-05-21 LAB — TYPE AND SCREEN
ABO/RH(D): O POS
Antibody Screen: NEGATIVE
Unit division: 0
Unit division: 0

## 2020-05-21 LAB — POCT ACTIVATED CLOTTING TIME: Activated Clotting Time: 142 seconds

## 2020-05-21 LAB — POCT I-STAT, CHEM 8
BUN: 13 mg/dL (ref 8–23)
Calcium, Ion: 1.11 mmol/L — ABNORMAL LOW (ref 1.15–1.40)
Chloride: 96 mmol/L — ABNORMAL LOW (ref 98–111)
Creatinine, Ser: 0.7 mg/dL (ref 0.61–1.24)
Glucose, Bld: 124 mg/dL — ABNORMAL HIGH (ref 70–99)
HCT: 29 % — ABNORMAL LOW (ref 39.0–52.0)
Hemoglobin: 9.9 g/dL — ABNORMAL LOW (ref 13.0–17.0)
Potassium: 3.6 mmol/L (ref 3.5–5.1)
Sodium: 137 mmol/L (ref 135–145)
TCO2: 26 mmol/L (ref 22–32)

## 2020-05-21 LAB — POTASSIUM: Potassium: 3.8 mmol/L (ref 3.5–5.1)

## 2020-05-21 LAB — SURGICAL PATHOLOGY

## 2020-05-21 LAB — BPAM RBC
Blood Product Expiration Date: 202110312359
Blood Product Expiration Date: 202110312359
ISSUE DATE / TIME: 202110011323
ISSUE DATE / TIME: 202110011323
Unit Type and Rh: 5100
Unit Type and Rh: 5100

## 2020-05-21 LAB — CBC
HCT: 30.3 % — ABNORMAL LOW (ref 39.0–52.0)
Hemoglobin: 10.3 g/dL — ABNORMAL LOW (ref 13.0–17.0)
MCH: 29.5 pg (ref 26.0–34.0)
MCHC: 34 g/dL (ref 30.0–36.0)
MCV: 86.8 fL (ref 80.0–100.0)
Platelets: 78 10*3/uL — ABNORMAL LOW (ref 150–400)
RBC: 3.49 MIL/uL — ABNORMAL LOW (ref 4.22–5.81)
RDW: 14.1 % (ref 11.5–15.5)
WBC: 6.7 10*3/uL (ref 4.0–10.5)
nRBC: 0 % (ref 0.0–0.2)

## 2020-05-21 LAB — GLUCOSE, CAPILLARY
Glucose-Capillary: 115 mg/dL — ABNORMAL HIGH (ref 70–99)
Glucose-Capillary: 124 mg/dL — ABNORMAL HIGH (ref 70–99)
Glucose-Capillary: 133 mg/dL — ABNORMAL HIGH (ref 70–99)
Glucose-Capillary: 137 mg/dL — ABNORMAL HIGH (ref 70–99)
Glucose-Capillary: 137 mg/dL — ABNORMAL HIGH (ref 70–99)
Glucose-Capillary: 140 mg/dL — ABNORMAL HIGH (ref 70–99)

## 2020-05-21 LAB — COOXEMETRY PANEL
Carboxyhemoglobin: 0.9 % (ref 0.5–1.5)
Methemoglobin: 0.6 % (ref 0.0–1.5)
O2 Saturation: 61 %
Total hemoglobin: 10.5 g/dL — ABNORMAL LOW (ref 12.0–16.0)

## 2020-05-21 MED ORDER — POTASSIUM CHLORIDE 20 MEQ PO PACK
40.0000 meq | PACK | Freq: Four times a day (QID) | ORAL | Status: AC
  Administered 2020-05-21 (×2): 40 meq via ORAL
  Filled 2020-05-21 (×2): qty 2

## 2020-05-21 MED ORDER — POTASSIUM CHLORIDE 10 MEQ/50ML IV SOLN
10.0000 meq | INTRAVENOUS | Status: AC
  Administered 2020-05-21 (×3): 10 meq via INTRAVENOUS
  Filled 2020-05-21 (×3): qty 50

## 2020-05-21 MED ORDER — METOLAZONE 2.5 MG PO TABS
2.5000 mg | ORAL_TABLET | Freq: Once | ORAL | Status: AC
  Administered 2020-05-21: 2.5 mg
  Filled 2020-05-21: qty 1

## 2020-05-21 MED ORDER — NOREPINEPHRINE 16 MG/250ML-% IV SOLN
0.0000 ug/min | INTRAVENOUS | Status: DC
  Administered 2020-05-21 – 2020-05-22 (×2): 10 ug/min via INTRAVENOUS
  Filled 2020-05-21 (×2): qty 250

## 2020-05-21 MED ORDER — POTASSIUM CHLORIDE 10 MEQ/50ML IV SOLN
INTRAVENOUS | Status: AC
  Administered 2020-05-21: 10 meq via INTRAVENOUS
  Filled 2020-05-21: qty 50

## 2020-05-21 MED ORDER — MAGNESIUM SULFATE 2 GM/50ML IV SOLN
2.0000 g | Freq: Once | INTRAVENOUS | Status: AC
  Administered 2020-05-21: 2 g via INTRAVENOUS
  Filled 2020-05-21: qty 50

## 2020-05-21 MED ORDER — DOPAMINE-DEXTROSE 3.2-5 MG/ML-% IV SOLN
2.5000 ug/kg/min | INTRAVENOUS | Status: DC
  Administered 2020-05-21: 2.5 ug/kg/min via INTRAVENOUS
  Filled 2020-05-21: qty 250

## 2020-05-21 MED ORDER — POTASSIUM CHLORIDE 10 MEQ/50ML IV SOLN
10.0000 meq | INTRAVENOUS | Status: AC
  Administered 2020-05-21 (×2): 10 meq via INTRAVENOUS
  Filled 2020-05-21 (×2): qty 50

## 2020-05-21 MED ORDER — POTASSIUM CHLORIDE 20 MEQ/15ML (10%) PO SOLN
20.0000 meq | ORAL | Status: AC
  Administered 2020-05-21 (×3): 20 meq
  Filled 2020-05-21 (×3): qty 15

## 2020-05-21 NOTE — Op Note (Signed)
CARDIOTHORACIC SURGERY OPERATIVE NOTE  Date of Procedure: 05/18/2020 Preoperative Diagnosis: Severe 3-vessel Coronary Artery Disease, including left main coronary artery disease and atrial fibrillation  Postoperative Diagnosis: Same  Procedure:      Modified maze procedure (bilateral pulmonary vein isolation and right atrial appendage clipping) coronary Artery Bypass Grafting x 4  Left Internal Mammary Artery to Distal Left Anterior Descending Coronary Artery; pedicled right internal mammary artery to posterior Descending Coronary Artery with coronary endarterectomy; left radial artery graft to first and second obtuse Marginal Branches of Left Circumflex Coronary Artery as a sequenced graft Bilateral internal mammary artery harvesting Left radial artery harvesting, open Completion indocyanine green fluorescence imaging Surgeon: B.  Murvin Natal, MD  Assistant: Evonnie Pat, PA-C  Anesthesia: eneral endotracheal  Operative Findings:  Severely reduced left ventricular systolic function  Good quality left internal mammary artery conduit  Good quality radial artery conduit  Good quality target vessels for grafting except for PDA which was extensively disease at the area of coronary opening    BRIEF CLINICAL NOTE AND INDICATIONS FOR SURGERY  74 year old gentleman has been previously active daily presented with heart failure symptoms of shortness of breath and orthopnea.  His evaluation demonstrated elevated BNP.  He underwent left heart catheterization demonstrating multivessel coronary artery disease including severe left main disease and proximal RCA disease.  He had a depressed ejection fraction and was treated with a balloon pump for OR preparation.  He is now taken to the operating room for CABG.   DETAILS OF THE OPERATIVE PROCEDURE  Preparation:  The patient is brought to the operating room on the above mentioned date and central monitoring was established by the anesthesia  team including placement of Swan-Ganz catheter and radial arterial line. The patient is placed in the supine position on the operating table.  Intravenous antibiotics are administered. General endotracheal anesthesia is induced uneventfully. A Foley catheter is placed.  Baseline transesophageal echocardiogram was performed.  Findings were notable for severely reduced LV function and preserved right ventricular function The patient's chest, abdomen, both groins, the left upper extremity, and both lower extremities are prepared and draped in a sterile manner. A time out procedure is performed.   Surgical Approach and Conduit Harvest:  A median sternotomy incision was performed and the left internal mammary artery is dissected from the chest wall and prepared for bypass grafting. The left internal mammary artery is notably good quality conduit. Simultaneously, open harvesting of the left radial artery is performed.  After removal of the arterial graft, the forearm incision is closed in layers and the arm is tucked at the side.  Next attention was turned to the right hemithorax of the right internal mammary artery and its pedicle mobilized in the standard fashion.  Following systemic heparinization, the nternal mammary artery grafts was transected distally noted to have excellent flow.  They were treated with solution of papaverine   Extracorporeal Cardiopulmonary Bypass and Myocardial Protection:  The pericardium is opened. The ascending aorta is nondiseased in appearance. The ascending aorta and the right atrium are cannulated for cardiopulmonary bypass.  Adequate heparinization is verified.    A retrograde cardioplegia cannula is placed through the right atrium into the coronary sinus.  The entire pre-bypass portion of the operation was notable for stable hemodynamics.  Cardiopulmonary bypass was begun and the surface of the heart is inspected. Distal target vessels are selected for coronary artery  bypass grafting. A cardioplegia cannula is placed in the ascending aorta.    The  pulmonary veins on the right side are encircled and treated with a bipolar RFA clamp for right-sided pulmonary vein isolation.  The patient is allowed to cool passively to 34C systemic temperature.  The aortic cross clamp is applied and cold blood cardioplegia is delivered initially in an antegrade fashion through the aortic root.   Supplemental cardioplegia is given retrograde through the coronary sinus catheter.  Iced saline slush is applied for topical hypothermia.  The initial cardioplegic arrest is rapid with early diastolic arrest.  Repeat doses of cardioplegia are administered intermittently throughout the entire cross clamp portion of the operation through the aortic root, through the coronary sinus catheter, and through subsequently placed  grafts in order to maintain completely flat electrocardiogram.  Next the ligament of Ruthann Cancer was divided with electrocautery.  The left-sided pulmonary veins are encircled and treated with bipolar RFA energy.  When this is completed the left atrial appendage is clipped at its base.   Coronary Artery Bypass Grafting:   The  posterior descending branch of the right coronary artery was grafted using the pedicled right internal mammary artery graft in an end-to-side fashion.  The PDA required endarterectomy and was patched due to the length of the endarterectomy with a piece of radial artery which been opened to a single layer.  At the site of distal anastomosis the target vessel was fair quality and measured approximately 1.5 mm in diameter. Anastomotic patency and runoff was confirmed with indocyanine green fluorescence imaging (SPY).  The first and second  obtuse marginal branches of the left circumflex coronary artery were grafted using the left radial artery graft in a sequenced fashion.  At the site of distal anastomosis the target vessel was good quality and measured  approximately 1.5 mm in diameter. Anastomotic patency and runoff was confirmed with indocyanine green fluorescence imaging (SPY).The distal left anterior coronary artery was grafted with the left internal mammary artery in an end-to-side fashion.  At the site of distal anastomosis the target vessel was good quality and measured approximately 1.5 mm in diameter. Anastomotic patency and runoff was confirmed with indocyanine green fluorescence imaging (SPY).  The proximal radial artery graft anastomosis wasplaced directly to the ascending aorta prior to removal of the aortic cross clamp.  A hotshot dose of cardioplegia was given down the aortic root.  De-airing procedures were performed and the aortic cross-clamp was removed.   Procedure Completion:  All proximal and distal coronary anastomoses were inspected for hemostasis and appropriate graft orientation. Epicardial pacing wires are fixed to the right ventricular outflow tract and to the right atrial appendage. The patient is rewarmed to 37C temperature. The patient is weaned and disconnected from cardiopulmonary bypass.  The patient's rhythm at separation from bypass was sinus bradycardia.  The patient was weaned from cardiopulmonary bypass with moderate inotropic support and reinstitution of a balloon counterpulsation of the aorta  Followup transesophageal echocardiogram performed after separation from bypass revealed no changes from the preoperative exam.  The aortic and venous cannula were removed uneventfully. Protamine was administered to reverse the anticoagulation. The mediastinum and pleural space were inspected for hemostasis and irrigated with saline solution. The mediastinum and bilateral pleural space were drained using fluted chest tubes placed through separate stab incisions inferiorly.  The soft tissues anterior to the aorta were reapproximated loosely. The sternum is closed with double strength sternal wire. The soft tissues anterior to  the sternum were closed in multiple layers and the skin is closed with a running subcuticular skin closure.  The post-bypass portion of the operation was notable for stable rhythm and hemodynamics.      Disposition:  The patient tolerated the procedure well and is transported to the surgical intensive care in stable condition. There are no intraoperative complications. All sponge instrument and needle counts are verified correct at completion of the operation.    Jayme Cloud, MD 05/21/2020 1:18 PM

## 2020-05-21 NOTE — Progress Notes (Signed)
3 Days Post-Op Procedure(s) (LRB): CORONARY ARTERY BYPASS GRAFTING (CABG) TIMES FOUR USING INTERNAL BILATERAL MAMMARY ARTERIES AND HARVESTED LEFT RADIAL ARTERY. (N/A) MAZE USING ATRICURE ISOLATOR CLAMP OLL2 (N/A) RADIAL ARTERY HARVEST (Left) TRANSESOPHAGEAL ECHOCARDIOGRAM (TEE) (N/A) INDOCYANINE GREEN FLUORESCENCE IMAGING (ICG) (N/A) CLIPPING OF ATRIAL APPENDAGE USING ATRICURE 45 MM ATRICLIP (Left) Subjective: Intubated/sedated Objective: Vital signs in last 24 hours: Temp:  [98.8 F (37.1 C)-100.8 F (38.2 C)] 98.8 F (37.1 C) (10/04 1215) Pulse Rate:  [32-107] 33 (10/04 1215) Cardiac Rhythm: Atrial paced (10/04 0800) Resp:  [12-30] 13 (10/04 1215) BP: (103-144)/(34-68) 103/68 (10/04 1138) SpO2:  [96 %-100 %] 99 % (10/04 1215) Arterial Line BP: (108-181)/(33-68) 136/49 (10/04 1215) FiO2 (%):  [40 %-50 %] 40 % (10/04 1200) Weight:  [118.1 kg] 118.1 kg (10/04 0500)  Hemodynamic parameters for last 24 hours: PAP: (23-34)/(4-19) 25/12 CVP:  [0 mmHg-13 mmHg] 6 mmHg PCWP:  [17 mmHg] 17 mmHg CO:  [2.5 L/min-4.3 L/min] 4.3 L/min CI:  [1.1 L/min/m2-1.9 L/min/m2] 1.9 L/min/m2  Intake/Output from previous day: 10/03 0701 - 10/04 0700 In: 4485.4 [I.V.:3270.3; NG/GT:685; IV Piggyback:530.1] Out: 6245 [Urine:5105; Emesis/NG output:450; Chest Tube:690] Intake/Output this shift: Total I/O In: 1135.2 [I.V.:738.5; NG/GT:300; IV Piggyback:96.8] Out: 840 [Urine:740; Chest Tube:100]  General appearance: sedated Neurologic: unable to assess fully Heart: regular rate and rhythm, S1, S2 normal, no murmur, click, rub or gallop Lungs: clear to auscultation bilaterally Abdomen: soft, non-tender; bowel sounds normal; no masses,  no organomegaly Extremities: warm Wound: dressed  Lab Results: Recent Labs    05/20/20 1725 05/21/20 0000 05/21/20 0004 05/21/20 0252  WBC 6.9  --   --  6.7  HGB 10.3*   < > 10.2* 10.3*  HCT 30.7*   < > 30.0* 30.3*  PLT 87*  --   --  78*   < > = values in this  interval not displayed.   BMET:  Recent Labs    05/21/20 0252 05/21/20 1104  NA 136 133*  K 3.5 3.6  CL 99 97*  CO2 27 27  GLUCOSE 126* 138*  BUN 12 11  CREATININE 0.81 0.83  CALCIUM 7.8* 7.7*    PT/INR:  Recent Labs    05/18/20 1507  LABPROT 21.1*  INR 1.9*   ABG    Component Value Date/Time   PHART 7.472 (H) 05/21/2020 0004   HCO3 28.8 (H) 05/21/2020 0004   TCO2 30 05/21/2020 0004   ACIDBASEDEF 1.0 05/19/2020 2131   O2SAT 61.0 05/21/2020 0346   CBG (last 3)  Recent Labs    05/21/20 0347 05/21/20 0708 05/21/20 1107  GLUCAP 133* 140* 137*    Assessment/Plan: S/P Procedure(s) (LRB): CORONARY ARTERY BYPASS GRAFTING (CABG) TIMES FOUR USING INTERNAL BILATERAL MAMMARY ARTERIES AND HARVESTED LEFT RADIAL ARTERY. (N/A) MAZE USING ATRICURE ISOLATOR CLAMP OLL2 (N/A) RADIAL ARTERY HARVEST (Left) TRANSESOPHAGEAL ECHOCARDIOGRAM (TEE) (N/A) INDOCYANINE GREEN FLUORESCENCE IMAGING (ICG) (N/A) CLIPPING OF ATRIAL APPENDAGE USING ATRICURE 45 MM ATRICLIP (Left) wean iabp  Try to extubate   LOS: 5 days    Eric Stokes 05/21/2020

## 2020-05-21 NOTE — Addendum Note (Signed)
Addendum  created 05/21/20 0700 by Josephine Igo, CRNA   Order list changed

## 2020-05-21 NOTE — Progress Notes (Signed)
EVENING ROUNDS NOTE :     [address]       [place] [address]             [phone]                 3 Days Post-Op Procedure(s) (LRB): CORONARY ARTERY BYPASS GRAFTING (CABG) TIMES FOUR USING INTERNAL BILATERAL MAMMARY ARTERIES AND HARVESTED LEFT RADIAL ARTERY. (N/A) MAZE USING ATRICURE ISOLATOR CLAMP OLL2 (N/A) RADIAL ARTERY HARVEST (Left) TRANSESOPHAGEAL ECHOCARDIOGRAM (TEE) (N/A) INDOCYANINE GREEN FLUORESCENCE IMAGING (ICG) (N/A) CLIPPING OF ATRIAL APPENDAGE USING ATRICURE 45 MM ATRICLIP (Left)   Total Length of Stay:  LOS: 5 days  Events:   IABP out On SBT    BP 108/65   Pulse 100   Temp 98.6 F (37 C)   Resp 19   Ht 5\' 10"  (1.778 m)   Wt 118.1 kg   SpO2 98%   BMI 37.36 kg/m   PAP: (20-33)/(4-18) 20/12 CVP:  [0 mmHg-11 mmHg] 7 mmHg PCWP:  [17 mmHg] 17 mmHg CO:  [2.5 L/min-4.3 L/min] 4.1 L/min CI:  [1.1 L/min/m2-1.9 L/min/m2] 1.8 L/min/m2  Vent Mode: PSV;CPAP FiO2 (%):  [40 %-50 %] 40 % Set Rate:  [12 bmp] 12 bmp Vt Set:  [580 mL] 580 mL PEEP:  [5 cmH20] 5 cmH20 Pressure Support:  [10 cmH20] 10 cmH20 Plateau Pressure:  [13 cmH20-20 cmH20] 17 cmH20  . sodium chloride Stopped (05/21/20 1506)  . sodium chloride    . sodium chloride 10 mL/hr at 05/20/20 2359  . amiodarone 30 mg/hr (05/21/20 1600)  . dexmedetomidine (PRECEDEX) IV infusion 0.6 mcg/kg/hr (05/21/20 1600)  . epinephrine 2 mcg/min (05/21/20 1600)  . furosemide (LASIX) infusion 6 mg/hr (05/21/20 1600)  . lactated ringers    . lactated ringers 20 mL/hr at 05/21/20 1600  . milrinone 0.5 mcg/kg/min (05/21/20 1600)  . nitroGLYCERIN 7 mcg/min (05/21/20 1600)  . norepinephrine (LEVOPHED) Adult infusion 10 mcg/min (05/21/20 1600)  . vasopressin Stopped (05/19/20 0941)    I/O last 3 completed shifts: In: 6823.7 [I.V.:5221.3; NG/GT:745; IV Piggyback:857.4] Out: 9300 [Urine:7380; Emesis/NG output:700; Chest Tube:1220]   CBC Latest Ref Rng & Units 05/21/2020 05/21/2020 05/21/2020   WBC 4.0 - 10.5 K/uL 6.7 - -  Hemoglobin 13.0 - 17.0 g/dL 10.3(L) 10.2(L) 9.9(L)  Hematocrit 39 - 52 % 30.3(L) 30.0(L) 29.0(L)  Platelets 150 - 400 K/uL 78(L) - -    BMP Latest Ref Rng & Units 05/21/2020 05/21/2020 05/21/2020  Glucose 70 - 99 mg/dL 138(H) 126(H) -  BUN 8 - 23 mg/dL 11 12 -  Creatinine 0.61 - 1.24 mg/dL 0.83 0.81 -  Sodium 135 - 145 mmol/L 133(L) 136 137  Potassium 3.5 - 5.1 mmol/L 3.6 3.5 3.5  Chloride 98 - 111 mmol/L 97(L) 99 -  CO2 22 - 32 mmol/L 27 27 -  Calcium 8.9 - 10.3 mg/dL 7.7(L) 7.8(L) -    ABG    Component Value Date/Time   PHART 7.472 (H) 05/21/2020 0004   PCO2ART 39.5 05/21/2020 0004   PO2ART 113 (H) 05/21/2020 0004   HCO3 28.8 (H) 05/21/2020 0004   TCO2 30 05/21/2020 0004   ACIDBASEDEF 1.0 05/19/2020 2131   O2SAT 61.0 05/21/2020 0346       Melodie Bouillon, MD 05/21/2020 5:48 PM

## 2020-05-21 NOTE — Progress Notes (Signed)
Removal of IABP without complication. Manual compression applied for 35 minutes. Right groin is stable with no oozing, bruising or hematoma present. Distal pulses present. BP 143/78, HR: 96, SP02: 97%. Sterile dressing applied to right insertion sote. Upon departure the site was assessed by nurse. Groin soft and palpable. Dressing dry and intact.

## 2020-05-21 NOTE — Progress Notes (Addendum)
Patient ID: Eric Stokes, male   DOB: 1946-07-16, 74 y.o.   MRN: [id number] P    Advanced Heart Failure Rounding Note  PCP-Cardiologist: No primary care provider on file.   Subjective:    Taken to OR 10/1 for CABG + MAZE and LAA clip.   Intubated and sedated.   Did not tolerate milrinone wean yesterday. Co-ox dropped from 67>>51%. Milrinone increased back up from 0.375>>0.5.   Continues on IABP, now 1:2 + milrinone 0.5, NE 10, EPi 2 + Dopamine 2.5 + Nitro gtt 7 (radial harvest) + Lasix gtt at 6/hr. Co-ox 61% today. CI 1.24 (discrepant results between CI which is consistently low). Good diuresis yesterday, -5L in UOP. CVP 10 today.   SCr 0.81 K 3.5    Swan #s CI 1.24 CO 2.79 PAP 33/17 (25) CVP 10  Co-ox 61%   Objective:   Weight Range: 118.1 kg Body mass index is 37.36 kg/m.   Vital Signs:   Temp:  [99 F (37.2 C)-100.8 F (38.2 C)] 99.1 F (37.3 C) (10/04 0815) Pulse Rate:  [47-107] 47 (10/04 0815) Resp:  [12-30] 18 (10/04 0815) BP: (106-147)/(34-64) 106/64 (10/04 0729) SpO2:  [97 %-100 %] 99 % (10/04 0815) Arterial Line BP: (108-181)/(33-64) 142/55 (10/04 0815) FiO2 (%):  [40 %-50 %] 40 % (10/04 0800) Weight:  [118.1 kg] 118.1 kg (10/04 0500) Last BM Date:  (prior to surgery)  Weight change: Filed Weights   05/18/20 0500 05/19/20 0715 05/21/20 0500  Weight: 108.2 kg 117 kg 118.1 kg    Intake/Output:   Intake/Output Summary (Last 24 hours) at 05/21/2020 0839 Last data filed at 05/21/2020 0800 Gross per 24 hour  Intake 4480.69 ml  Output 6090 ml  Net -1609.31 ml      Physical Exam    CVP 10  General: intubated and sedated Neck: + Rt IJ Swan ganz cath  Lungs: intubated, course BS bilaterally  CV: Nondisplaced PMI.  Heart regular S1/S2, no S3/S4, no murmur. + mechanical pump sounds.   Abdomen: obese, soft, nontender, no hepatosplenomegaly, no distention.  Skin: Intact without lesions or rashes.  Neurologic: intubated and sedated  Extremities:  No clubbing or cyanosis. + Rt femoral IABP  HEENT: + ETT   Telemetry   Sinus tach low 100s  (personally reviewed)   Labs    CBC Recent Labs    05/18/20 2047 05/19/20 0309 05/20/20 1725 05/21/20 0000 05/21/20 0004 05/21/20 0252  WBC 11.0*   < > 6.9  --   --  6.7  NEUTROABS 9.6*  --   --   --   --   --   HGB 10.1*   < > 10.3*   < > 10.2* 10.3*  HCT 31.0*   < > 30.7*   < > 30.0* 30.3*  MCV 89.3   < > 88.0  --   --  86.8  PLT 142*   < > 87*  --   --  78*   < > = values in this interval not displayed.   Basic Metabolic Panel Recent Labs    05/18/20 2047 05/19/20 0309 05/19/20 1636 05/19/20 2131 05/20/20 1725 05/20/20 1725 05/21/20 0000 05/21/20 0000 05/21/20 0004 05/21/20 0252  NA 136   < > 138   < > 137   < > 137   < > 137 136  K 3.1*   < > 4.3   < > 4.0   < > 3.6   < > 3.5 3.5  CL 107   < >  109   < > 101   < > 96*  --   --  99  CO2 17*   < > 22   < > 28  --   --   --   --  27  GLUCOSE 169*   < > 109*   < > 127*   < > 124*  --   --  126*  BUN 15   < > 15   < > 12   < > 13  --   --  12  CREATININE 1.15   < > 1.03   < > 0.90   < > 0.70  --   --  0.81  CALCIUM 7.7*   < > 7.8*   < > 8.0*  --   --   --   --  7.8*  MG 2.7*   < > 2.2  --  1.8  --   --   --   --   --   PHOS 1.3*  --   --   --   --   --   --   --   --   --    < > = values in this interval not displayed.   Liver Function Tests Recent Labs    05/18/20 2047  AST 57*  ALT 25  ALKPHOS 28*  BILITOT 1.7*  PROT 5.2*  ALBUMIN 3.3*   No results for input(s): LIPASE, AMYLASE in the last 72 hours. Cardiac Enzymes No results for input(s): CKTOTAL, CKMB, CKMBINDEX, TROPONINI in the last 72 hours.  BNP: BNP (last 3 results) Recent Labs    05/16/20 1155  BNP 415.7*    ProBNP (last 3 results) No results for input(s): PROBNP in the last 8760 hours.   D-Dimer No results for input(s): DDIMER in the last 72 hours. Hemoglobin A1C No results for input(s): HGBA1C in the last 72 hours. Fasting Lipid  Panel No results for input(s): CHOL, HDL, LDLCALC, TRIG, CHOLHDL, LDLDIRECT in the last 72 hours. Thyroid Function Tests No results for input(s): TSH, T4TOTAL, T3FREE, THYROIDAB in the last 72 hours.  Invalid input(s): FREET3  Other results:   Imaging    DG Chest Port 1 View  Result Date: 05/21/2020 CLINICAL DATA:  Hypoxia EXAM: PORTABLE CHEST 1 VIEW COMPARISON:  May 21, 2019 FINDINGS: Endotracheal tube tip is 8.5 cm above the carina. Nasogastric tube tip and side port are below the diaphragm. Swan-Ganz catheter tip is at the origin of the right inter lobar pulmonary artery. Bilateral chest tubes and mediastinal drain remain. No pneumothorax. Persistent right pleural effusion with bibasilar atelectasis. No new opacity. Patient status post coronary artery bypass grafting and left atrial appendage clamp placement. Stable cardiomegaly. No bone lesions. IMPRESSION: Tube and catheter positions as described without pneumothorax. Right pleural effusion with bibasilar atelectasis. No new opacity. Stable cardiac prominence. Electronically Signed   By: Lowella Grip III M.D.   On: 05/21/2020 08:06     Medications:     Scheduled Medications: . acetaminophen  1,000 mg Oral Q6H   Or  . acetaminophen (TYLENOL) oral liquid 160 mg/5 mL  1,000 mg Per Tube Q6H  . aspirin EC  325 mg Oral Daily   Or  . aspirin  324 mg Per Tube Daily  . bisacodyl  10 mg Oral Daily   Or  . bisacodyl  10 mg Rectal Daily  . chlorhexidine gluconate (MEDLINE KIT)  15 mL Mouth Rinse BID  . Chlorhexidine Gluconate Cloth  6 each Topical Daily  . docusate  200 mg Per Tube Daily  . insulin aspart  0-15 Units Subcutaneous Q4H  . latanoprost  1 drop Both Eyes QHS  . mouth rinse  15 mL Mouth Rinse 10 times per day  . mupirocin ointment  1 application Nasal BID  . pantoprazole sodium  40 mg Per Tube QHS  . rosuvastatin  20 mg Per Tube Daily  . sodium chloride flush  3 mL Intravenous Q12H    Infusions: . sodium  chloride Stopped (05/21/20 0706)  . sodium chloride    . sodium chloride 10 mL/hr at 05/20/20 2359  . amiodarone 30 mg/hr (05/21/20 0800)  . dexmedetomidine (PRECEDEX) IV infusion 1.2 mcg/kg/hr (05/21/20 0831)  . DOPamine 2.5 mcg/kg/min (05/21/20 0836)  . epinephrine 2 mcg/min (05/21/20 0800)  . furosemide (LASIX) infusion 6 mg/hr (05/21/20 0800)  . lactated ringers    . lactated ringers 20 mL/hr at 05/21/20 0800  . milrinone 0.5 mcg/kg/min (05/21/20 0800)  . nitroGLYCERIN 7 mcg/min (05/21/20 0800)  . norepinephrine (LEVOPHED) Adult infusion 10 mcg/min (05/21/20 0800)  . phenylephrine (NEO-SYNEPHRINE) Adult infusion Stopped (05/18/20 1604)  . vasopressin Stopped (05/19/20 0941)    PRN Medications: sodium chloride, metoprolol tartrate, midazolam, morphine injection, ondansetron (ZOFRAN) IV, oxyCODONE, sodium chloride flush, sodium chloride flush, traMADol    Assessment/Plan   1. CAD/NSTEMI with critcal LM disease - HS Trop [phone]. LHC with severe multivessel disease.  - S/p CABG 10/1 - ASA per tube, Crestor 20 (myalgias with atorvastatin).   2. Acute Systolic Heart Failure>>Cardiogenic Shock  - ECHO in 2020 showed EF 60-65%.  - ECHO this admit showed EF is down to 25-30%. RV normal.  Repeat echo post-op on 10/2 showed EF 25%, normal RV, no pericardial effusion.  - Ischemic CMP. Cath this admit w/ severe MVCAD w/ critical LM disease, s/p CABG as above.  - This morning, CVP 10.  Discrepant results between CI which is consistently low (1.24) and co-ox 61%, creatinine is stable and lactate has been normal.  For now, we will follow the co-ox.  MAPs stable.  - Continue IABP wean, now at 1:2  - Continue milrinone, EPi, NE and dopamine, wean inotrope/ pressors as tolerated  - Continue lasix gtt at 28m/hr. Supp K    3. A fib RVR  - s/p MAZE w/ LAA Clip  - continue amio gtt at 30 mg/hr  - monitor on tele, currently NSR.  - Will need eventual anticoagulation.   4. H/o  Prostate Cancer - coudet catheter in place due to massive prostate enlargement   Length of Stay: 5  Brittainy Simmons, PA-C  8:39 AM   Agree with above.   Her remains intubated/sedated. On multiple pressors. IABP weaned to 1:2.   Swan numbers done personally and swan recalibrated (for VIP swan)   CVP 8 PA 28/14  PCWP 17 SVR 1466 Thermo 4.3/1.9 Fick  CI 2.3 Co-ox 61%  Currently A-paced at 9  General:  Intubated/sedated HEENT: normal + ETT Neck: supple. RIJ swan  Carotids 2+ bilat; no bruits. No lymphadenopathy or thryomegaly appreciated. Cor: PMI nondisplaced. Regular rate & rhythm. + rub  Sternal dressing and CTs ok Lungs: coarse Abdomen: soft, nontender, nondistended. No hepatosplenomegaly. No bruits or masses. hypoactive bowel sounds. Extremities: no cyanosis, clubbing, rash, 2+ edema RFA IABP Neuro: sedated  Improving slowly. Swan numbers not too bad. SVR still high. Wean NE and IABP as tolerated. Continue milrinone. Continue lasix gtt. Will give one dose metolazone.  Hopefully  we can extubated after some more fluid off.   CRITICAL CARE Performed by: Glori Bickers  Total critical care time: 45 minutes  Critical care time was exclusive of separately billable procedures and treating other patients.  Critical care was necessary to treat or prevent imminent or life-threatening deterioration.  Critical care was time spent personally by me (independent of midlevel providers or residents) on the following activities: development of treatment plan with patient and/or surrogate as well as nursing, discussions with consultants, evaluation of patient's response to treatment, examination of patient, obtaining history from patient or surrogate, ordering and performing treatments and interventions, ordering and review of laboratory studies, ordering and review of radiographic studies, pulse oximetry and re-evaluation of patient's condition.  Glori Bickers, MD  10:46  AM

## 2020-05-22 ENCOUNTER — Inpatient Hospital Stay (HOSPITAL_COMMUNITY): Payer: No Typology Code available for payment source

## 2020-05-22 LAB — GLUCOSE, CAPILLARY
Glucose-Capillary: 139 mg/dL — ABNORMAL HIGH (ref 70–99)
Glucose-Capillary: 113 mg/dL — ABNORMAL HIGH (ref 70–99)
Glucose-Capillary: 147 mg/dL — ABNORMAL HIGH (ref 70–99)
Glucose-Capillary: 114 mg/dL — ABNORMAL HIGH (ref 70–99)
Glucose-Capillary: 123 mg/dL — ABNORMAL HIGH (ref 70–99)

## 2020-05-22 LAB — BASIC METABOLIC PANEL
Anion gap: 10 (ref 5–15)
BUN: 11 mg/dL (ref 8–23)
CO2: 28 mmol/L (ref 22–32)
Calcium: 7.8 mg/dL — ABNORMAL LOW (ref 8.9–10.3)
Chloride: 96 mmol/L — ABNORMAL LOW (ref 98–111)
Creatinine, Ser: 0.85 mg/dL (ref 0.61–1.24)
GFR calc Af Amer: >60 mL/min (ref >60)
GFR calc non Af Amer: >60 mL/min (ref >60)
Glucose, Bld: 145 mg/dL — ABNORMAL HIGH (ref 70–99)
Potassium: 3.7 mmol/L (ref 3.5–5.1)
Sodium: 134 mmol/L — ABNORMAL LOW (ref 135–145)

## 2020-05-22 LAB — POCT I-STAT 7, (LYTES, BLD GAS, ICA,H+H)
Acid-Base Excess: 8 mmol/L — ABNORMAL HIGH (ref 0.0–2.0)
Bicarbonate: 32.1 mmol/L — ABNORMAL HIGH (ref 20.0–28.0)
Calcium, Ion: 1.06 mmol/L — ABNORMAL LOW (ref 1.15–1.40)
HCT: 31 % — ABNORMAL LOW (ref 39.0–52.0)
Hemoglobin: 10.5 g/dL — ABNORMAL LOW (ref 13.0–17.0)
O2 Saturation: 98 %
Patient temperature: 37.3
Potassium: 3.6 mmol/L (ref 3.5–5.1)
Sodium: 132 mmol/L — ABNORMAL LOW (ref 135–145)
TCO2: 33 mmol/L — ABNORMAL HIGH (ref 22–32)
pCO2 arterial: 40.1 mmHg (ref 32.0–48.0)
pH, Arterial: 7.513 — ABNORMAL HIGH (ref 7.350–7.450)
pO2, Arterial: 104 mmHg (ref 83.0–108.0)

## 2020-05-22 LAB — COOXEMETRY PANEL
Carboxyhemoglobin: 1.1 % (ref 0.5–1.5)
Methemoglobin: 0.7 % (ref 0.0–1.5)
O2 Saturation: 62.1 %
Total hemoglobin: 10.9 g/dL — ABNORMAL LOW (ref 12.0–16.0)

## 2020-05-22 LAB — CBC
HCT: 31.2 % — ABNORMAL LOW (ref 39.0–52.0)
Hemoglobin: 10.5 g/dL — ABNORMAL LOW (ref 13.0–17.0)
MCH: 28.7 pg (ref 26.0–34.0)
MCHC: 33.7 g/dL (ref 30.0–36.0)
MCV: 85.2 fL (ref 80.0–100.0)
Platelets: 103 10*3/uL — ABNORMAL LOW (ref 150–400)
RBC: 3.66 MIL/uL — ABNORMAL LOW (ref 4.22–5.81)
RDW: 13.5 % (ref 11.5–15.5)
WBC: 5.4 10*3/uL (ref 4.0–10.5)
nRBC: 0 % (ref 0.0–0.2)

## 2020-05-22 LAB — MAGNESIUM: Magnesium: 2 mg/dL (ref 1.7–2.4)

## 2020-05-22 MED ORDER — TRAMADOL HCL 50 MG PO TABS: 50.0000 mg | ORAL_TABLET | ORAL | Status: DC | PRN

## 2020-05-22 MED ORDER — PANTOPRAZOLE SODIUM 40 MG PO TBEC
40.0000 mg | DELAYED_RELEASE_TABLET | Freq: Every day | ORAL | Status: DC
  Administered 2020-05-22: 40 mg via ORAL
  Filled 2020-05-22: qty 1

## 2020-05-22 MED ORDER — POTASSIUM CHLORIDE 20 MEQ/15ML (10%) PO SOLN
40.0000 meq | ORAL | Status: DC
  Administered 2020-05-22: 40 meq
  Filled 2020-05-22: qty 30

## 2020-05-22 MED ORDER — COLCHICINE 0.3 MG HALF TABLET
0.3000 mg | ORAL_TABLET | Freq: Two times a day (BID) | ORAL | Status: DC
  Administered 2020-05-22 – 2020-05-25 (×6): 0.3 mg via ORAL
  Filled 2020-05-22 (×9): qty 1

## 2020-05-22 MED ORDER — LEVALBUTEROL TARTRATE 45 MCG/ACT IN AERO
2.0000 | INHALATION_SPRAY | Freq: Three times a day (TID) | RESPIRATORY_TRACT | Status: DC | PRN
  Filled 2020-05-22: qty 15

## 2020-05-22 MED ORDER — POTASSIUM CHLORIDE 20 MEQ/15ML (10%) PO SOLN: 20.0000 meq | ORAL | Status: DC

## 2020-05-22 MED ORDER — POTASSIUM CHLORIDE 10 MEQ/50ML IV SOLN
10.0000 meq | INTRAVENOUS | Status: AC
  Administered 2020-05-22 (×4): 10 meq via INTRAVENOUS
  Filled 2020-05-22 (×4): qty 50

## 2020-05-22 MED ORDER — ROSUVASTATIN CALCIUM 20 MG PO TABS
20.0000 mg | ORAL_TABLET | Freq: Every day | ORAL | Status: DC
  Administered 2020-05-24 – 2020-05-30 (×7): 20 mg via ORAL
  Filled 2020-05-22 (×7): qty 1

## 2020-05-22 MED ORDER — ORAL CARE MOUTH RINSE
15.0000 mL | Freq: Two times a day (BID) | OROMUCOSAL | Status: DC
  Administered 2020-05-24 – 2020-05-29 (×10): 15 mL via OROMUCOSAL

## 2020-05-22 MED ORDER — POTASSIUM CHLORIDE CRYS ER 20 MEQ PO TBCR
40.0000 meq | EXTENDED_RELEASE_TABLET | ORAL | Status: DC
  Administered 2020-05-22: 40 meq via ORAL
  Filled 2020-05-22: qty 2

## 2020-05-22 MED ORDER — OXYCODONE HCL 5 MG PO TABS
5.0000 mg | ORAL_TABLET | ORAL | Status: DC | PRN
  Administered 2020-05-22: 5 mg via ORAL
  Administered 2020-05-23 – 2020-05-25 (×3): 10 mg via ORAL
  Filled 2020-05-22 (×4): qty 2
  Filled 2020-05-22: qty 1

## 2020-05-22 MED ORDER — MAGNESIUM SULFATE 2 GM/50ML IV SOLN
2.0000 g | Freq: Once | INTRAVENOUS | Status: AC
  Administered 2020-05-22: 2 g via INTRAVENOUS
  Filled 2020-05-22: qty 50

## 2020-05-22 MED ORDER — DOCUSATE SODIUM 100 MG PO CAPS
200.0000 mg | ORAL_CAPSULE | Freq: Every day | ORAL | Status: DC
  Administered 2020-05-24 – 2020-05-27 (×4): 200 mg via ORAL
  Filled 2020-05-22 (×6): qty 2

## 2020-05-22 MED ORDER — MILRINONE LACTATE IN DEXTROSE 20-5 MG/100ML-% IV SOLN
0.2500 ug/kg/min | INTRAVENOUS | Status: DC
  Administered 2020-05-22 – 2020-05-23 (×2): 0.25 ug/kg/min via INTRAVENOUS
  Filled 2020-05-22 (×2): qty 100

## 2020-05-22 MED ORDER — COLCHICINE 0.3 MG HALF TABLET
0.3000 mg | ORAL_TABLET | Freq: Two times a day (BID) | ORAL | Status: DC
  Filled 2020-05-22: qty 1

## 2020-05-22 MED ORDER — LEVALBUTEROL TARTRATE 45 MCG/ACT IN AERO
2.0000 | INHALATION_SPRAY | Freq: Three times a day (TID) | RESPIRATORY_TRACT | Status: DC
  Administered 2020-05-22 (×2): 2 via RESPIRATORY_TRACT
  Filled 2020-05-22: qty 15

## 2020-05-22 MED ORDER — METOCLOPRAMIDE HCL 5 MG/ML IJ SOLN
10.0000 mg | Freq: Three times a day (TID) | INTRAMUSCULAR | Status: AC
  Administered 2020-05-22 – 2020-05-23 (×3): 10 mg via INTRAVENOUS
  Filled 2020-05-22 (×3): qty 2

## 2020-05-22 NOTE — Plan of Care (Signed)
  Problem: Education: Goal: Knowledge of General Education information will improve Description: Including pain rating scale, medication(s)/side effects and non-pharmacologic comfort measures Outcome: Progressing   Problem: Health Behavior/Discharge Planning: Goal: Ability to manage health-related needs will improve Outcome: Progressing   Problem: Clinical Measurements: Goal: Ability to maintain clinical measurements within normal limits will improve Outcome: Progressing Goal: Will remain free from infection Outcome: Progressing Goal: Diagnostic test results will improve Outcome: Progressing Goal: Respiratory complications will improve Outcome: Progressing Goal: Cardiovascular complication will be avoided Outcome: Progressing   Problem: Activity: Goal: Risk for activity intolerance will decrease Outcome: Progressing   Problem: Nutrition: Goal: Adequate nutrition will be maintained Outcome: Progressing   Problem: Coping: Goal: Level of anxiety will decrease Outcome: Progressing   Problem: Elimination: Goal: Will not experience complications related to bowel motility Outcome: Progressing Goal: Will not experience complications related to urinary retention Outcome: Progressing   Problem: Pain Managment: Goal: General experience of comfort will improve Outcome: Progressing   Problem: Safety: Goal: Ability to remain free from injury will improve Outcome: Progressing   Problem: Skin Integrity: Goal: Risk for impaired skin integrity will decrease Outcome: Progressing   Problem: Education: Goal: Will demonstrate proper wound care and an understanding of methods to prevent future damage Outcome: Progressing Goal: Knowledge of disease or condition will improve Outcome: Progressing Goal: Knowledge of the prescribed therapeutic regimen will improve Outcome: Progressing Goal: Individualized Educational Video(s) Outcome: Progressing   Problem: Activity: Goal: Risk for  activity intolerance will decrease Outcome: Progressing   Problem: Cardiac: Goal: Will achieve and/or maintain hemodynamic stability Outcome: Progressing   Problem: Clinical Measurements: Goal: Postoperative complications will be avoided or minimized Outcome: Progressing   Problem: Respiratory: Goal: Respiratory status will improve Outcome: Progressing   Problem: Skin Integrity: Goal: Wound healing without signs and symptoms of infection Outcome: Progressing Goal: Risk for impaired skin integrity will decrease Outcome: Progressing   Problem: Urinary Elimination: Goal: Ability to achieve and maintain adequate renal perfusion and functioning will improve Outcome: Progressing   Problem: Activity: Goal: Ability to tolerate increased activity will improve Outcome: Progressing   Problem: Respiratory: Goal: Ability to maintain a clear airway and adequate ventilation will improve Outcome: Progressing   Problem: Role Relationship: Goal: Method of communication will improve Outcome: Progressing   

## 2020-05-22 NOTE — Progress Notes (Signed)
Patient ID: Eric Stokes, male   DOB: [birth year], 74 y.o.   MRN: [id number] P    Advanced Heart Failure Rounding Note  PCP-Cardiologist: No primary care provider on file.   Subjective:    Taken to OR 10/1 for CABG + MAZE and LAA clip.   IABP out yesterday.   Just extubated this am. On milrinone 0.5, EPI 2, NE 7  Feels SOB. Starting to [employer] #s CVP 5 PA 24/7 Thermo 6.9/3.1   Objective:   Weight Range: 115.9 kg Body mass index is 36.66 kg/m.   Vital Signs:   Temp:  [98.6 F (37 C)-100.2 F (37.9 C)] 98.8 F (37.1 C) (10/05 1230) Pulse Rate:  [67-101] 90 (10/05 1230) Resp:  [13-33] 20 (10/05 1230) BP: (100-116)/(60-77) 104/74 (10/05 1200) SpO2:  [95 %-100 %] 97 % (10/05 1230) Arterial Line BP: (85-151)/(37-79) 136/38 (10/05 1230) FiO2 (%):  [40 %] 40 % (10/05 0825) Weight:  [115.9 kg] 115.9 kg (10/05 0409) Last BM Date:  (prior to surgery)  Weight change: Filed Weights   05/19/20 0715 05/21/20 0500 05/22/20 0409  Weight: 117 kg 118.1 kg 115.9 kg    Intake/Output:   Intake/Output Summary (Last 24 hours) at 05/22/2020 1432 Last data filed at 05/22/2020 1400 Gross per 24 hour  Intake 2477.31 ml  Output 4110 ml  Net -1632.69 ml      Physical Exam    General:  In bed just extubated.  HEENT: normal Neck: supple. nRIJ swan. Carotids 2+ bilat; no bruits. No lymphadenopathy or thryomegaly appreciated. Cor: Sternal dressing PMI nondisplaced. Regular rate & rhythm.un Lungs: decreased at baese Abdomen: soft, nontender, hypoactive bowel sounds. Extremities: no cyanosis, clubbing, rash, 1-2+ edema Neuro: alert & orientedx3, cranial nerves grossly intact. moves all 4 extremities w/o difficulty. Affect pleasan   Telemetry   Sinus 90s Personally reviewed   Labs    CBC Recent Labs    05/21/20 0252 05/21/20 0252 05/22/20 0500 05/22/20 0853  WBC 6.7  --  5.4  --   HGB 10.3*   < > 10.5* 10.5*  HCT 30.3*   < > 31.2* 31.0*  MCV 86.8  --  85.2   --   PLT 78*  --  103*  --    < > = values in this interval not displayed.   Basic Metabolic Panel Recent Labs    05/20/20 1725 05/21/20 0000 05/21/20 1104 05/21/20 2022 05/22/20 0500 05/22/20 0853  NA 137   < > 133*  --  134* 132*  K 4.0   < > 3.6   < > 3.7 3.6  CL 101   < > 97*  --  96*  --   CO2 28   < > 27  --  28  --   GLUCOSE 127*   < > 138*  --  145*  --   BUN 12   < > 11  --  11  --   CREATININE 0.90   < > 0.83  --  0.85  --   CALCIUM 8.0*   < > 7.7*  --  7.8*  --   MG 1.8  --   --   --  2.0  --    < > = values in this interval not displayed.   Liver Function Tests No results for input(s): AST, ALT, ALKPHOS, BILITOT, PROT, ALBUMIN in the last 72 hours. No results for input(s): LIPASE, AMYLASE in the last 72 hours. Cardiac Enzymes No results for  input(s): CKTOTAL, CKMB, CKMBINDEX, TROPONINI in the last 72 hours.  BNP: BNP (last 3 results) Recent Labs    05/16/20 1155  BNP 415.7*    ProBNP (last 3 results) No results for input(s): PROBNP in the last 8760 hours.   D-Dimer No results for input(s): DDIMER in the last 72 hours. Hemoglobin A1C No results for input(s): HGBA1C in the last 72 hours. Fasting Lipid Panel No results for input(s): CHOL, HDL, LDLCALC, TRIG, CHOLHDL, LDLDIRECT in the last 72 hours. Thyroid Function Tests No results for input(s): TSH, T4TOTAL, T3FREE, THYROIDAB in the last 72 hours.  Invalid input(s): FREET3  Other results:   Imaging    DG Chest 1 View  Result Date: 05/22/2020 CLINICAL DATA:  Hypoxia EXAM: CHEST  1 VIEW COMPARISON:  May 21, 2020 FINDINGS: Endotracheal tube tip is 5.6 cm above the carina. Nasogastric tube tip and side port are in stomach. Swan-Ganz catheter tip is in the right main pulmonary artery. There are chest tubes bilaterally and a mediastinal drain. There are temporary pacemaker wires attached to the right heart. No pneumothorax. There is a small right pleural effusion with bibasilar atelectasis. No  consolidation. Heart is mildly enlarged, stable. Postoperative changes noted, stable. No adenopathy. No bone lesions. IMPRESSION: Tube and catheter positions as described without pneumothorax. Fairly small right pleural effusion with right and left base atelectasis. Stable cardiac prominence. Appearance similar to 1 day prior. Electronically Signed   By: Lowella Grip III M.D.   On: 05/22/2020 08:30     Medications:     Scheduled Medications: . acetaminophen  1,000 mg Oral Q6H   Or  . acetaminophen (TYLENOL) oral liquid 160 mg/5 mL  1,000 mg Per Tube Q6H  . aspirin EC  325 mg Oral Daily   Or  . aspirin  324 mg Per Tube Daily  . bisacodyl  10 mg Oral Daily   Or  . bisacodyl  10 mg Rectal Daily  . chlorhexidine gluconate (MEDLINE KIT)  15 mL Mouth Rinse BID  . Chlorhexidine Gluconate Cloth  6 each Topical Daily  . colchicine  0.3 mg Oral BID  . [START ON 05/23/2020] docusate sodium  200 mg Oral Daily  . insulin aspart  0-15 Units Subcutaneous Q4H  . latanoprost  1 drop Both Eyes QHS  . levalbuterol  2 puff Inhalation Q8H  . mouth rinse  15 mL Mouth Rinse 10 times per day  . pantoprazole  40 mg Oral QHS  . potassium chloride  40 mEq Oral Q4H  . [START ON 05/23/2020] rosuvastatin  20 mg Oral Daily  . sodium chloride flush  3 mL Intravenous Q12H    Infusions: . sodium chloride Stopped (05/22/20 0833)  . sodium chloride    . sodium chloride 10 mL/hr at 05/20/20 2359  . amiodarone 30 mg/hr (05/22/20 1400)  . dexmedetomidine (PRECEDEX) IV infusion Stopped (05/22/20 0856)  . epinephrine 2 mcg/min (05/22/20 1400)  . furosemide (LASIX) infusion 6 mg/hr (05/22/20 1400)  . lactated ringers    . lactated ringers Stopped (05/22/20 0355)  . milrinone    . nitroGLYCERIN 7 mcg/min (05/22/20 1400)  . norepinephrine (LEVOPHED) Adult infusion 7 mcg/min (05/22/20 1400)  . vasopressin Stopped (05/19/20 0941)    PRN Medications: sodium chloride, metoprolol tartrate, midazolam, morphine  injection, ondansetron (ZOFRAN) IV, oxyCODONE, sodium chloride flush, sodium chloride flush, traMADol    Assessment/Plan   1. CAD/NSTEMI with critcal LM disease - HS Trop [phone]. LHC with severe multivessel disease.  - S/p CABG  10/1 - ASA/Crestor 20 (myalgias with atorvastatin).   2. Acute Systolic Heart Failure>>Cardiogenic Shock  - ECHO in 2020 showed EF 60-65%.  - ECHO this admit showed EF is down to 25-30%. RV normal.  Repeat echo post-op on 10/2 showed EF 25%, normal RV, no pericardial effusion.  - Ischemic CMP. Cath this admit w/ severe MVCAD w/ critical LM disease, s/p CABG as above.  - IABP out.  - Hemodynamics improved - On milrinone, EPi, NE. Co-ox 62% Wean milrinone to 0.375  - Continue lasix gtt at 28m/hr. Supp K    3. A fib RVR  - s/p MAZE w/ LAA Clip  - continue amio gtt at 30 mg/hr  - monitor on tele, currently NSR - Will eventually need anticoagulation.   4. H/o Prostate Cancer - coudet catheter in place due to massive prostate enlargement   CRITICAL CARE Performed by: BGlori Bickers Total critical care time: 35 minutes  Critical care time was exclusive of separately billable procedures and treating other patients.  Critical care was necessary to treat or prevent imminent or life-threatening deterioration.  Critical care was time spent personally by me (independent of midlevel providers or residents) on the following activities: development of treatment plan with patient and/or surrogate as well as nursing, discussions with consultants, evaluation of patient's response to treatment, examination of patient, obtaining history from patient or surrogate, ordering and performing treatments and interventions, ordering and review of laboratory studies, ordering and review of radiographic studies, pulse oximetry and re-evaluation of patient's condition.    Length of Stay: 6  DGlori Bickers MD  2:32 PM

## 2020-05-22 NOTE — Progress Notes (Signed)
4 Days Post-Op Procedure(s) (LRB): CORONARY ARTERY BYPASS GRAFTING (CABG) TIMES FOUR USING INTERNAL BILATERAL MAMMARY ARTERIES AND HARVESTED LEFT RADIAL ARTERY. (N/A) MAZE USING ATRICURE ISOLATOR CLAMP OLL2 (N/A) RADIAL ARTERY HARVEST (Left) TRANSESOPHAGEAL ECHOCARDIOGRAM (TEE) (N/A) INDOCYANINE GREEN FLUORESCENCE IMAGING (ICG) (N/A) CLIPPING OF ATRIAL APPENDAGE USING ATRICURE 45 MM ATRICLIP (Left) Subjective: Intubated but no complaints  Objective: Vital signs in last 24 hours: Temp:  [98.6 F (37 C)-100.2 F (37.9 C)] 98.8 F (37.1 C) (10/05 0704) Pulse Rate:  [32-101] 93 (10/05 0704) Cardiac Rhythm: Atrial paced (10/04 2000) Resp:  [12-33] 21 (10/05 0704) BP: (103-116)/(60-77) 111/74 (10/05 0704) SpO2:  [96 %-100 %] 99 % (10/05 0704) Arterial Line BP: (85-266)/(37-182) 138/47 (10/05 0704) FiO2 (%):  [40 %] 40 % (10/05 0311) Weight:  [115.9 kg] 115.9 kg (10/05 0409)  Hemodynamic parameters for last 24 hours: PAP: (18-32)/(4-18) 26/15 CVP:  [2 mmHg-8 mmHg] 2 mmHg PCWP:  [17 mmHg] 17 mmHg CO:  [2.6 L/min-6.6 L/min] 5.6 L/min CI:  [1.2 L/min/m2-2.9 L/min/m2] 2.5 L/min/m2  Intake/Output from previous day: 10/04 0701 - 10/05 0700 In: 3421.5 [I.V.:2754.9; NG/GT:420; IV Piggyback:246.7] Out: 7106 [Urine:3895; Emesis/NG output:400; Chest Tube:500] Intake/Output this shift: No intake/output data recorded.  General appearance: alert and cooperative Neurologic: intact Heart: regular rate and rhythm, S1, S2 normal, no murmur, click, rub or gallop Lungs: clear to auscultation bilaterally Abdomen: soft, non-tender; bowel sounds normal; no masses,  no organomegaly Extremities: edema 1+ Wound: dressed, dry  Lab Results: Recent Labs    05/21/20 0252 05/22/20 0500  WBC 6.7 5.4  HGB 10.3* 10.5*  HCT 30.3* 31.2*  PLT 78* 103*   BMET:  Recent Labs    05/21/20 1104 05/21/20 1104 05/21/20 2022 05/22/20 0500  NA 133*  --   --  134*  K 3.6   < > 3.8 3.7  CL 97*  --   --   96*  CO2 27  --   --  28  GLUCOSE 138*  --   --  145*  BUN 11  --   --  11  CREATININE 0.83  --   --  0.85  CALCIUM 7.7*  --   --  7.8*   < > = values in this interval not displayed.    PT/INR: No results for input(s): LABPROT, INR in the last 72 hours. ABG    Component Value Date/Time   PHART 7.472 (H) 05/21/2020 0004   HCO3 28.8 (H) 05/21/2020 0004   TCO2 30 05/21/2020 0004   ACIDBASEDEF 1.0 05/19/2020 2131   O2SAT 62.1 05/22/2020 0510   CBG (last 3)  Recent Labs    05/21/20 2342 05/22/20 0339 05/22/20 0753  GLUCAP 115* 139* 113*    Assessment/Plan: S/P Procedure(s) (LRB): CORONARY ARTERY BYPASS GRAFTING (CABG) TIMES FOUR USING INTERNAL BILATERAL MAMMARY ARTERIES AND HARVESTED LEFT RADIAL ARTERY. (N/A) MAZE USING ATRICURE ISOLATOR CLAMP OLL2 (N/A) RADIAL ARTERY HARVEST (Left) TRANSESOPHAGEAL ECHOCARDIOGRAM (TEE) (N/A) INDOCYANINE GREEN FLUORESCENCE IMAGING (ICG) (N/A) CLIPPING OF ATRIAL APPENDAGE USING ATRICURE 45 MM ATRICLIP (Left) extubate  Consider removing chest tubes later today if he can stand and dump   LOS: 6 days    Wonda Olds 05/22/2020

## 2020-05-22 NOTE — Progress Notes (Signed)
This note also relates to the following rows which could not be included: SpO2 - Cannot attach notes to unvalidated device data  Rapid weaning protocol  

## 2020-05-22 NOTE — Progress Notes (Addendum)
Patient ID: Eric Stokes, male   DOB: 1946/06/13, 74 y.o.   MRN: [id number] EVENING ROUNDS NOTE :     [address]       [place] [address]             [phone]                 4 Days Post-Op Procedure(s) (LRB): CORONARY ARTERY BYPASS GRAFTING (CABG) TIMES FOUR USING INTERNAL BILATERAL MAMMARY ARTERIES AND HARVESTED LEFT RADIAL ARTERY. (N/A) MAZE USING ATRICURE ISOLATOR CLAMP OLL2 (N/A) RADIAL ARTERY HARVEST (Left) TRANSESOPHAGEAL ECHOCARDIOGRAM (TEE) (N/A) INDOCYANINE GREEN FLUORESCENCE IMAGING (ICG) (N/A) CLIPPING OF ATRIAL APPENDAGE USING ATRICURE 45 MM ATRICLIP (Left)  Total Length of Stay:  LOS: 6 days  BP 128/74   Pulse 97   Temp 99.1 F (37.3 C)   Resp (!) 23   Ht 5\' 10"  (1.778 m)   Wt 115.9 kg   SpO2 97%   BMI 36.66 kg/m   .Intake/Output      10/04 0701 - 10/05 0700 10/05 0701 - 10/06 0700   I.V. (mL/kg) 2754.9 (23.8) 513 (4.4)   NG/GT 420    IV Piggyback 246.7 71.9   Total Intake(mL/kg) 3421.5 (29.5) 585 (5)   Urine (mL/kg/hr) 3895 (1.4) 800 (0.6)   Emesis/NG output 400    Chest Tube 500 110   Total Output 4795 910   Net -1373.5 -325.1          . sodium chloride Stopped (05/22/20 0833)  . sodium chloride    . sodium chloride 10 mL/hr at 05/20/20 2359  . amiodarone 30 mg/hr (05/22/20 1600)  . dexmedetomidine (PRECEDEX) IV infusion Stopped (05/22/20 0856)  . epinephrine Stopped (05/22/20 1502)  . furosemide (LASIX) infusion 6 mg/hr (05/22/20 1600)  . lactated ringers    . lactated ringers Stopped (05/22/20 0355)  . milrinone 0.25 mcg/kg/min (05/22/20 1600)  . nitroGLYCERIN 7 mcg/min (05/22/20 1600)  . norepinephrine (LEVOPHED) Adult infusion 7 mcg/min (05/22/20 1600)  . potassium chloride 10 mEq (05/22/20 1723)  . vasopressin Stopped (05/19/20 0941)     Lab Results  Component Value Date   WBC 5.4 05/22/2020   HGB 10.5 (L) 05/22/2020   HCT 31.0 (L) 05/22/2020   PLT 103 (L) 05/22/2020   GLUCOSE 145 (H) 05/22/2020   CHOL  123 05/17/2020   TRIG 71 05/17/2020   HDL 32 (L) 05/17/2020   LDLDIRECT 108 (H) 04/11/2020   LDLCALC NOT CALCULATED 05/17/2020   ALT 25 05/18/2020   AST 57 (H) 05/18/2020   NA 132 (L) 05/22/2020   K 3.6 05/22/2020   CL 96 (L) 05/22/2020   CREATININE 0.85 05/22/2020   BUN 11 05/22/2020   CO2 28 05/22/2020   TSH 1.825 05/16/2020   PSA 8.64 09/08/2019   INR 1.9 (H) 05/18/2020   HGBA1C 5.3 05/18/2020   Weaning drips Epi off Mil  5 [place] Progression Recent Vital Signs   BP 128/74   Pulse 97   Temp 99.1 F (37.3 C)   Resp (!) 23   Ht 5\' 10"  (1.778 m)   Wt 115.9 kg   SpO2 97%   BMI 36.66 kg/m    Past Medical History:  Diagnosis Date  . Aortic stenosis, mild    noted on ECHO  . Asthma   . Benign essential hypertension, on Norvasc and Amlodipine 12/21/2008   Followed by [facility]   . BPH (benign prostatic hyperplasia) 09/03/2012  . Carotid bruit present 06/15/2018  2015 Carotid Dopplers:  Essentially normal carotid arteries, with very slight hard plaque in the proximal ICA's and serpentine distal vessels. 1-39% bilateral ICA stenosis. Patent vertebral arteries with antegrade flow. Normal subclavian arteries, bilaterally.  . Colonic polyp 01/09/2020  . COPD (chronic obstructive pulmonary disease) ([place])   . Dyslipidemia 12/21/2008   Qualifier: Diagnosis of  By: Percival Spanish, MD, Farrel Gordon    . Essential hypertension, benign 12/21/2008   Qualifier: Diagnosis of  By: Percival Spanish, MD, Farrel Gordon    . Former smoker, stopped smoking in distant past 03/15/2018  . Hematuria 09/16/2011  . History of migraine   . OA (osteoarthritis)   . Obese   . OSA on CPAP 09/25/2015   Diagnosed at the [facility] 2016   . Prostate cancer ([place]) 11/30/2019  . PVC's (premature ventricular contractions)   . Seasonal allergies   . Sinus bradycardia   . Squamous cell carcinoma of skin   . TIA (transient ischemic attack)    questionable  . Vitamin B 12 deficiency   . Vitamin D deficiency  11/26/2010     Expected Discharge Date     Diet Order    None       VTE Documentation  Sequential compression devices, below knee   Work Intensity Score/Level of Care  5  @LEVELOFCARE @   Mobility  Head of Bed Elevated : HOB 30 Activity: Dangled on edge of bed Range of Motion/Exercises: Active, All extremities Level of Assistance: Total care Assistive Device: None RLE Weight Bearing: Non weight bearing Mobility Response: Tolerated well Mobility performed by: Nurse Bed Position: Semi-fowlers Transport method: Bed     Significant Events    DC Barriers   Abnormal Labs:  Grace Isaac 05/22/2020, 6:09 PM down to .25 decreasing levophed  Nausea and some vomiting, abdomen not distended- not responding to zofran- add 3 doses of Reglan   Grace Isaac MD  Beeper [phone] Office [phone] 05/22/2020 6:08 PM

## 2020-05-22 NOTE — Procedures (Signed)
Extubation Procedure Note  Patient Details:   Name: Eric Stokes DOB: [birth year] MRN: [id number]   Airway Documentation:    Vent end date: 05/22/20 Vent end time: 0900   Evaluation  O2 sats: stable throughout Complications: No apparent complications Patient did tolerate procedure well. Bilateral Breath Sounds: Diminished, Clear   Yes   Pt was extubated per rapid wean protocol and placed on 3 L [place]. Vc was 950 and NIF was -20 prior to extubation. Cuff leak was present and no stridor was noted. Pt is stable on 3 L [place]. RT will monitor.   [name] 05/22/2020, 9:02 AM

## 2020-05-23 ENCOUNTER — Inpatient Hospital Stay (HOSPITAL_COMMUNITY): Payer: No Typology Code available for payment source

## 2020-05-23 DIAGNOSIS — I214 Non-ST elevation (NSTEMI) myocardial infarction: Secondary | ICD-10-CM | POA: Diagnosis not present

## 2020-05-23 DIAGNOSIS — R570 Cardiogenic shock: Secondary | ICD-10-CM | POA: Diagnosis not present

## 2020-05-23 DIAGNOSIS — Z951 Presence of aortocoronary bypass graft: Secondary | ICD-10-CM | POA: Diagnosis not present

## 2020-05-23 LAB — GLUCOSE, CAPILLARY
Glucose-Capillary: 107 mg/dL — ABNORMAL HIGH (ref 70–99)
Glucose-Capillary: 110 mg/dL — ABNORMAL HIGH (ref 70–99)
Glucose-Capillary: 112 mg/dL — ABNORMAL HIGH (ref 70–99)
Glucose-Capillary: 115 mg/dL — ABNORMAL HIGH (ref 70–99)
Glucose-Capillary: 120 mg/dL — ABNORMAL HIGH (ref 70–99)
Glucose-Capillary: 120 mg/dL — ABNORMAL HIGH (ref 70–99)
Glucose-Capillary: 125 mg/dL — ABNORMAL HIGH (ref 70–99)

## 2020-05-23 LAB — CBC
HCT: 31.5 % — ABNORMAL LOW (ref 39.0–52.0)
Hemoglobin: 10.7 g/dL — ABNORMAL LOW (ref 13.0–17.0)
MCH: 29.3 pg (ref 26.0–34.0)
MCHC: 34 g/dL (ref 30.0–36.0)
MCV: 86.3 fL (ref 80.0–100.0)
Platelets: 110 10*3/uL — ABNORMAL LOW (ref 150–400)
RBC: 3.65 MIL/uL — ABNORMAL LOW (ref 4.22–5.81)
RDW: 13.9 % (ref 11.5–15.5)
WBC: 4.7 10*3/uL (ref 4.0–10.5)
nRBC: 0 % (ref 0.0–0.2)

## 2020-05-23 LAB — COOXEMETRY PANEL
Carboxyhemoglobin: 1.2 % (ref 0.5–1.5)
Carboxyhemoglobin: 1 % (ref 0.5–1.5)
Methemoglobin: 0.8 % (ref 0.0–1.5)
Methemoglobin: 0.7 % (ref 0.0–1.5)
O2 Saturation: 63.8 %
O2 Saturation: 55.6 %
Total hemoglobin: 10.8 g/dL — ABNORMAL LOW (ref 12.0–16.0)
Total hemoglobin: 11.8 g/dL — ABNORMAL LOW (ref 12.0–16.0)

## 2020-05-23 LAB — BASIC METABOLIC PANEL
Anion gap: 12 (ref 5–15)
BUN: 15 mg/dL (ref 8–23)
CO2: 28 mmol/L (ref 22–32)
Calcium: 7.7 mg/dL — ABNORMAL LOW (ref 8.9–10.3)
Chloride: 93 mmol/L — ABNORMAL LOW (ref 98–111)
Creatinine, Ser: 0.97 mg/dL (ref 0.61–1.24)
GFR calc non Af Amer: >60 mL/min (ref >60)
Glucose, Bld: 126 mg/dL — ABNORMAL HIGH (ref 70–99)
Potassium: 3.6 mmol/L (ref 3.5–5.1)
Sodium: 133 mmol/L — ABNORMAL LOW (ref 135–145)

## 2020-05-23 MED ORDER — POTASSIUM CHLORIDE 10 MEQ/50ML IV SOLN
10.0000 meq | INTRAVENOUS | Status: AC
  Administered 2020-05-23 (×6): 10 meq via INTRAVENOUS
  Filled 2020-05-23 (×6): qty 50

## 2020-05-23 MED ORDER — HYDRALAZINE HCL 25 MG PO TABS
25.0000 mg | ORAL_TABLET | Freq: Four times a day (QID) | ORAL | Status: DC
  Administered 2020-05-24: 25 mg via ORAL
  Filled 2020-05-23: qty 1

## 2020-05-23 MED ORDER — PANTOPRAZOLE SODIUM 40 MG IV SOLR
40.0000 mg | Freq: Every day | INTRAVENOUS | Status: DC
  Administered 2020-05-23: 40 mg via INTRAVENOUS
  Filled 2020-05-23: qty 40

## 2020-05-23 MED ORDER — QUETIAPINE FUMARATE 50 MG PO TABS
25.0000 mg | ORAL_TABLET | Freq: Every day | ORAL | Status: AC
  Administered 2020-05-23 – 2020-05-25 (×3): 25 mg via ORAL
  Filled 2020-05-23 (×3): qty 1

## 2020-05-23 MED ORDER — INFLUENZA VAC A&B SA ADJ QUAD 0.5 ML IM PRSY
0.5000 mL | PREFILLED_SYRINGE | INTRAMUSCULAR | Status: DC
  Filled 2020-05-23: qty 0.5

## 2020-05-23 MED ORDER — MILRINONE LACTATE IN DEXTROSE 20-5 MG/100ML-% IV SOLN
0.1250 ug/kg/min | INTRAVENOUS | Status: DC
  Administered 2020-05-23 – 2020-05-26 (×4): 0.125 ug/kg/min via INTRAVENOUS
  Filled 2020-05-23 (×5): qty 100

## 2020-05-23 MED ORDER — BOOST / RESOURCE BREEZE PO LIQD CUSTOM
1.0000 | Freq: Three times a day (TID) | ORAL | Status: DC
  Administered 2020-05-23 – 2020-05-24 (×2): 1 via ORAL

## 2020-05-23 MED FILL — Electrolyte-R (PH 7.4) Solution: INTRAVENOUS | Qty: 5000 | Status: AC

## 2020-05-23 MED FILL — Mannitol IV Soln 20%: INTRAVENOUS | Qty: 500 | Status: AC

## 2020-05-23 MED FILL — Sodium Chloride IV Soln 0.9%: INTRAVENOUS | Qty: 3000 | Status: AC

## 2020-05-23 MED FILL — Sodium Bicarbonate IV Soln 8.4%: INTRAVENOUS | Qty: 50 | Status: AC

## 2020-05-23 MED FILL — Thrombin For Soln 5000 Unit: CUTANEOUS | Qty: 5000 | Status: AC

## 2020-05-23 MED FILL — Calcium Chloride Inj 10%: INTRAVENOUS | Qty: 20 | Status: AC

## 2020-05-23 MED FILL — Lidocaine HCl Local Soln Prefilled Syringe 100 MG/5ML (2%): INTRAMUSCULAR | Qty: 5 | Status: AC

## 2020-05-23 NOTE — Progress Notes (Signed)
TCTS BRIEF SICU PROGRESS NOTE  5 Days Post-Op  S/P Procedure(s) (LRB): CORONARY ARTERY BYPASS GRAFTING (CABG) TIMES FOUR USING INTERNAL BILATERAL MAMMARY ARTERIES AND HARVESTED LEFT RADIAL ARTERY. (N/A) MAZE USING ATRICURE ISOLATOR CLAMP OLL2 (N/A) RADIAL ARTERY HARVEST (Left) TRANSESOPHAGEAL ECHOCARDIOGRAM (TEE) (N/A) INDOCYANINE GREEN FLUORESCENCE IMAGING (ICG) (N/A) CLIPPING OF ATRIAL APPENDAGE USING ATRICURE 45 MM ATRICLIP (Left)   Stable day NSR w/ stable BP Breathing comfortably w/ O2 sats 98-99% on 3 L/min  Plan: Continue current plan  Rexene Alberts, MD 05/23/2020 5:02 PM

## 2020-05-23 NOTE — Progress Notes (Signed)
5 Days Post-Op Procedure(s) (LRB): CORONARY ARTERY BYPASS GRAFTING (CABG) TIMES FOUR USING INTERNAL BILATERAL MAMMARY ARTERIES AND HARVESTED LEFT RADIAL ARTERY. (N/A) MAZE USING ATRICURE ISOLATOR CLAMP OLL2 (N/A) RADIAL ARTERY HARVEST (Left) TRANSESOPHAGEAL ECHOCARDIOGRAM (TEE) (N/A) INDOCYANINE GREEN FLUORESCENCE IMAGING (ICG) (N/A) CLIPPING OF ATRIAL APPENDAGE USING ATRICURE 45 MM ATRICLIP (Left) Subjective: Not feeling well  Objective: Vital signs in last 24 hours: Temp:  [98.2 F (36.8 C)-99.5 F (37.5 C)] 98.2 F (36.8 C) (10/06 0700) Pulse Rate:  [64-105] 93 (10/06 0700) Cardiac Rhythm: Normal sinus rhythm (10/05 2000) Resp:  [15-27] 21 (10/06 0700) BP: (100-145)/(60-83) 133/83 (10/06 0500) SpO2:  [92 %-100 %] 98 % (10/06 0700) Arterial Line BP: (78-154)/(36-98) 101/98 (10/06 0700) FiO2 (%):  [32 %-40 %] 32 % (10/05 1609) Weight:  [114.9 kg] 114.9 kg (10/06 0500)  Hemodynamic parameters for last 24 hours: PAP: (16-33)/(4-15) 32/12 CVP:  [5 mmHg] 5 mmHg CO:  [4.7 L/min-6.2 L/min] 6.2 L/min CI:  [2.1 L/min/m2-2.8 L/min/m2] 2.8 L/min/m2  Intake/Output from previous day: 10/05 0701 - 10/06 0700 In: 1413.4 [P.O.:30; I.V.:1133.4; IV Piggyback:250] Out: 1865 [FKCLE:7517; Chest Tube:380] Intake/Output this shift: No intake/output data recorded.  General appearance: alert and cooperative Neurologic: intact Heart: regular rate and rhythm, S1, S2 normal, no murmur, click, rub or gallop Lungs: clear to auscultation bilaterally Abdomen: soft, non-tender; bowel sounds normal; no masses,  no organomegaly Extremities: extremities normal, atraumatic, no cyanosis or edema Wound: dressed  Lab Results: Recent Labs    05/22/20 0500 05/22/20 0500 05/22/20 0853 05/23/20 0441  WBC 5.4  --   --  4.7  HGB 10.5*   < > 10.5* 10.7*  HCT 31.2*   < > 31.0* 31.5*  PLT 103*  --   --  110*   < > = values in this interval not displayed.   BMET:  Recent Labs    05/22/20 0500  05/22/20 0500 05/22/20 0853 05/23/20 0441  NA 134*   < > 132* 133*  K 3.7   < > 3.6 3.6  CL 96*  --   --  93*  CO2 28  --   --  28  GLUCOSE 145*  --   --  126*  BUN 11  --   --  15  CREATININE 0.85  --   --  0.97  CALCIUM 7.8*  --   --  7.7*   < > = values in this interval not displayed.    PT/INR: No results for input(s): LABPROT, INR in the last 72 hours. ABG    Component Value Date/Time   PHART 7.513 (H) 05/22/2020 0853   HCO3 32.1 (H) 05/22/2020 0853   TCO2 33 (H) 05/22/2020 0853   ACIDBASEDEF 1.0 05/19/2020 2131   O2SAT 63.8 05/23/2020 0441   CBG (last 3)  Recent Labs    05/22/20 1531 05/22/20 2011 05/23/20 0003  GLUCAP 114* 123* 112*    Assessment/Plan: S/P Procedure(s) (LRB): CORONARY ARTERY BYPASS GRAFTING (CABG) TIMES FOUR USING INTERNAL BILATERAL MAMMARY ARTERIES AND HARVESTED LEFT RADIAL ARTERY. (N/A) MAZE USING ATRICURE ISOLATOR CLAMP OLL2 (N/A) RADIAL ARTERY HARVEST (Left) TRANSESOPHAGEAL ECHOCARDIOGRAM (TEE) (N/A) INDOCYANINE GREEN FLUORESCENCE IMAGING (ICG) (N/A) CLIPPING OF ATRIAL APPENDAGE USING ATRICURE 45 MM ATRICLIP (Left) Mobilize Diuresis bp control  D/c swan, arterial line Pt/ot Consider cortrak placement; consider SLP consult due to prolonged ventilation   LOS: 7 days    Wonda Olds 05/23/2020

## 2020-05-23 NOTE — Progress Notes (Signed)
Initial Nutrition Assessment  DOCUMENTATION CODES:   Not applicable  INTERVENTION:    Boost Breeze po TID, each supplement provides 250 kcal and 9 grams of protein  If intake does not improve, consider Cortrak placement on Friday. Jevity 1.2 at 70 ml/h with Prosource TF 45 ml TID would provide 2136 kcal, 126 gm protein, and 1361 ml free water dailly.   NUTRITION DIAGNOSIS:   Increased nutrient needs related to post-op healing as evidenced by estimated needs.  GOAL:   Patient will meet greater than or equal to 90% of their needs  MONITOR:   Diet advancement, PO intake, Supplement acceptance, Labs  REASON FOR ASSESSMENT:   Rounds    ASSESSMENT:   74 yo male admitted with 3-vessel CAD with A fib. S/P CABG x 4 and MAZE procedure on 10/1. PMH includes HTN, OSA, HLD, BPH, former smoker, COPD.   Patient was intubated 10/1 through 10/5 S/P surgery. He was on a regular diet prior to surgery and intake was good. Patient has not had nutrition since prior to surgery.  He has been nauseous and unable to take meds and meals PO. Cortrak feeding tube was ordered, but patient has refused.  He will re-consider Cortrak on Friday if he is unable to take POs at that time. Diet has been advanced to clear liquids today. Will add PO supplements.  Labs reviewed. Na 132 CBG: 112-120-107-120  Medications reviewed and include colace, novolog, reglan, IV Lasix, IV KCl.  Weights reviewed. No recent significant weight changes noted. Since admission weight has increased due to fluid status. Patient has mild pitting edema to BUE and deep/very deep pitting edema to BLE.  NUTRITION - FOCUSED PHYSICAL EXAM:  uanble to complete  Diet Order:   Diet Order            Diet clear liquid Room service appropriate? Yes; Fluid consistency: Thin  Diet effective now                 EDUCATION NEEDS:   Not appropriate for education at this time  Skin:  Skin Assessment: Reviewed RN  Assessment  Last BM:  prior to surgery  Height:   Ht Readings from Last 1 Encounters:  05/21/20 5\' 10"  (1.778 m)    Weight:   Wt Readings from Last 1 Encounters:  05/23/20 114.9 kg   Admit weight 108.9 kg  Ideal Body Weight:  75.5 kg  BMI:  Body mass index is 36.35 kg/m.  Estimated Nutritional Needs:   Kcal:  2000-2200  Protein:  115-130 gm  Fluid:  >/= 2 L    Lucas Mallow, RD, LDN, CNSC Please refer to Amion for contact information.

## 2020-05-23 NOTE — Progress Notes (Addendum)
Patient ID: Eric Stokes, male   DOB: 20-Jan-1946, 74 y.o.   MRN: [id number] P    Advanced Heart Failure Rounding Note  PCP-Cardiologist: No primary care provider on file.   Subjective:   Weight on 10/1 238 pounds.   Taken to OR 10/1 for CABG + MAZE and LAA clip.   Remains on milrinone 0.25 mcg + nitro 7 mcg + lasix drip 6 mg per hour.   CO-OX 64%.  PAP: (16-33)/(4-16) 32/12 CVP:  [5 mmHg] 5 mmHg CO:  [4.7 L/min-6.2 L/min] 6.2 L/min CI:  [2.1 L/min/m2-2.8 L/min/m2] 2.8 L/min/m2   Complaining of nausea and pain.    Objective:   Weight Range: 115.9 kg Body mass index is 36.66 kg/m.   Vital Signs:   Temp:  [98.2 F (36.8 C)-99.5 F (37.5 C)] 98.2 F (36.8 C) (10/06 0700) Pulse Rate:  [64-105] 93 (10/06 0700) Resp:  [15-27] 21 (10/06 0700) BP: (100-145)/(60-83) 133/83 (10/06 0500) SpO2:  [92 %-100 %] 98 % (10/06 0700) Arterial Line BP: (78-154)/(36-98) 101/98 (10/06 0700) FiO2 (%):  [32 %-40 %] 32 % (10/05 1609) Last BM Date:  (prior to surgery)  Weight change: Filed Weights   05/19/20 0715 05/21/20 0500 05/22/20 0409  Weight: 117 kg 118.1 kg 115.9 kg    Intake/Output:   Intake/Output Summary (Last 24 hours) at 05/23/2020 0740 Last data filed at 05/23/2020 0700 Gross per 24 hour  Intake 1151.8 ml  Output 1865 ml  Net -713.2 ml      Physical Exam   CVP 7  General:  Appears weak. No resp difficulty HEENT: normal Neck: supple. JVP difficult to assess due to body habitus.  Carotids 2+ bilat; no bruits. No lymphadenopathy or thryomegaly appreciated. RIJ  Cor: PMI nondisplaced. Regular rate & rhythm. No rubs, gallops or murmurs. MT  Lungs: clear Abdomen: soft, nontender, nondistended. No hepatosplenomegaly. No bruits or masses. Good bowel sounds. Extremities: no cyanosis, clubbing, rash, trace edema. LUE dressing.  Neuro: alert & orientedx3, cranial nerves grossly intact. moves all 4 extremities w/o difficulty. Affect flat     Telemetry   SR 90s with  PVCs    Labs    CBC Recent Labs    05/22/20 0500 05/22/20 0500 05/22/20 0853 05/23/20 0441  WBC 5.4  --   --  4.7  HGB 10.5*   < > 10.5* 10.7*  HCT 31.2*   < > 31.0* 31.5*  MCV 85.2  --   --  86.3  PLT 103*  --   --  110*   < > = values in this interval not displayed.   Basic Metabolic Panel Recent Labs    05/20/20 1725 05/21/20 0000 05/22/20 0500 05/22/20 0500 05/22/20 0853 05/23/20 0441  NA 137   < > 134*   < > 132* 133*  K 4.0   < > 3.7   < > 3.6 3.6  CL 101   < > 96*  --   --  93*  CO2 28   < > 28  --   --  28  GLUCOSE 127*   < > 145*  --   --  126*  BUN 12   < > 11  --   --  15  CREATININE 0.90   < > 0.85  --   --  0.97  CALCIUM 8.0*   < > 7.8*  --   --  7.7*  MG 1.8  --  2.0  --   --   --    < > =  values in this interval not displayed.   Liver Function Tests No results for input(s): AST, ALT, ALKPHOS, BILITOT, PROT, ALBUMIN in the last 72 hours. No results for input(s): LIPASE, AMYLASE in the last 72 hours. Cardiac Enzymes No results for input(s): CKTOTAL, CKMB, CKMBINDEX, TROPONINI in the last 72 hours.  BNP: BNP (last 3 results) Recent Labs    05/16/20 1155  BNP 415.7*    ProBNP (last 3 results) No results for input(s): PROBNP in the last 8760 hours.   D-Dimer No results for input(s): DDIMER in the last 72 hours. Hemoglobin A1C No results for input(s): HGBA1C in the last 72 hours. Fasting Lipid Panel No results for input(s): CHOL, HDL, LDLCALC, TRIG, CHOLHDL, LDLDIRECT in the last 72 hours. Thyroid Function Tests No results for input(s): TSH, T4TOTAL, T3FREE, THYROIDAB in the last 72 hours.  Invalid input(s): FREET3  Other results:   Imaging    DG Chest 1 View  Result Date: 05/22/2020 CLINICAL DATA:  Hypoxia EXAM: CHEST  1 VIEW COMPARISON:  May 21, 2020 FINDINGS: Endotracheal tube tip is 5.6 cm above the carina. Nasogastric tube tip and side port are in stomach. Swan-Ganz catheter tip is in the right main pulmonary artery. There  are chest tubes bilaterally and a mediastinal drain. There are temporary pacemaker wires attached to the right heart. No pneumothorax. There is a small right pleural effusion with bibasilar atelectasis. No consolidation. Heart is mildly enlarged, stable. Postoperative changes noted, stable. No adenopathy. No bone lesions. IMPRESSION: Tube and catheter positions as described without pneumothorax. Fairly small right pleural effusion with right and left base atelectasis. Stable cardiac prominence. Appearance similar to 1 day prior. Electronically Signed   By: Lowella Grip III M.D.   On: 05/22/2020 08:30     Medications:     Scheduled Medications: . acetaminophen  1,000 mg Oral Q6H   Or  . acetaminophen (TYLENOL) oral liquid 160 mg/5 mL  1,000 mg Per Tube Q6H  . aspirin EC  325 mg Oral Daily   Or  . aspirin  324 mg Per Tube Daily  . bisacodyl  10 mg Oral Daily   Or  . bisacodyl  10 mg Rectal Daily  . Chlorhexidine Gluconate Cloth  6 each Topical Daily  . colchicine  0.3 mg Oral BID  . docusate sodium  200 mg Oral Daily  . insulin aspart  0-15 Units Subcutaneous Q4H  . latanoprost  1 drop Both Eyes QHS  . mouth rinse  15 mL Mouth Rinse BID  . metoCLOPramide (REGLAN) injection  10 mg Intravenous Q8H  . pantoprazole  40 mg Oral QHS  . rosuvastatin  20 mg Oral Daily  . sodium chloride flush  3 mL Intravenous Q12H    Infusions: . sodium chloride 20 mL/hr at 05/23/20 0205  . sodium chloride    . sodium chloride 10 mL/hr at 05/20/20 2359  . amiodarone 30 mg/hr (05/23/20 0210)  . dexmedetomidine (PRECEDEX) IV infusion Stopped (05/22/20 0856)  . epinephrine Stopped (05/22/20 1502)  . furosemide (LASIX) infusion 6 mg/hr (05/23/20 0200)  . lactated ringers    . lactated ringers Stopped (05/22/20 0355)  . milrinone 0.25 mcg/kg/min (05/23/20 0617)  . nitroGLYCERIN 7 mcg/min (05/23/20 0200)  . norepinephrine (LEVOPHED) Adult infusion Stopped (05/22/20 2301)  . vasopressin Stopped  (05/19/20 0941)    PRN Medications: sodium chloride, levalbuterol, metoprolol tartrate, midazolam, morphine injection, ondansetron (ZOFRAN) IV, oxyCODONE, sodium chloride flush, sodium chloride flush, traMADol    Assessment/Plan   1. CAD/NSTEMI  with critcal LM disease - HS Trop [phone]. LHC with severe multivessel disease.  - S/p CABG 10/1 - ASA/Crestor 20 (myalgias with atorvastatin).   2. Acute Systolic Heart Failure>>Cardiogenic Shock  - ECHO in 2020 showed EF 60-65%.  - ECHO this admit showed EF is down to 25-30%. RV normal.  Repeat echo post-op on 10/2 showed EF 25%, normal RV, no pericardial effusion.  - Ischemic CMP. Cath this admit w/ severe MVCAD w/ critical LM disease, s/p CABG as above.  - IABP out. Off epi and Norepi.  - Cut back milrinone to 0.125 mcg. Continue nitro drip at 7 mcg. Maybe able to start imdur today.   . CO-OX 64%.  -CVP 7.  Continue lasix gtt at 6mg /hr. Supp K   - Renal function stable.   3. A fib RVR  - s/p MAZE w/ LAA Clip  - continue amio gtt at 30 mg/hr  - Maintaining NSR  - Will eventually need anticoagulation.   4. H/o Prostate Cancer - coudet catheter in place due to massive prostate enlargement   Consult PT. OOB.   Length of Stay: 7  Darrick Grinder, NP  7:40 AM   Agree with above.   Feels sore Having some nausea. No BM yet. Swan numbers improved on milrinone 0.25. Still volume overloaded but diuresing. About 13 pounds up from baseline. Rhythm stable.   General:  Sitting up in bed mildly uncomfortable HEENT: normal Neck: supple.RIJ swan  Carotids 2+ bilat; no bruits. No lymphadenopathy or thryomegaly appreciated. Cor: Sternal dressing ok PMI nondisplaced. Regular rate & rhythm. + prominent rub (vs chest tube squeak) Lungs: decreased BS Abdomen: soft, nontender,+ distended. No hepatosplenomegaly. No bruits or masses. Hypoactive bowel sounds. Extremities: no cyanosis, clubbing, rash, 1+ edema Neuro: alert & orientedx3, cranial  nerves grossly intact. moves all 4 extremities w/o difficulty. Affect pleasant  Progressing steadily after high-risk CABG. Hemodynamics much improved. Can wean milrinone down. Plan to pull swan. Volume status remains elevated. Continue IV lasix. CTs coming out today. Continue to mobilize.   CRITICAL CARE Performed by: Glori Bickers  Total critical care time: 35 minutes  Critical care time was exclusive of separately billable procedures and treating other patients.  Critical care was necessary to treat or prevent imminent or life-threatening deterioration.  Critical care was time spent personally by me (independent of midlevel providers or residents) on the following activities: development of treatment plan with patient and/or surrogate as well as nursing, discussions with consultants, evaluation of patient's response to treatment, examination of patient, obtaining history from patient or surrogate, ordering and performing treatments and interventions, ordering and review of laboratory studies, ordering and review of radiographic studies, pulse oximetry and re-evaluation of patient's condition.  Glori Bickers, MD  8:41 PM

## 2020-05-23 NOTE — Evaluation (Signed)
Physical Therapy Evaluation Patient Details Name: Eric Stokes MRN: [id number] DOB: 1946/06/19 Today's Date: 05/23/2020   History of Present Illness  pt is a 74 y/o mal with h/o sinus brady, PVC's, HTN OSA, prostate CA, recent R TKA admitted with CP and SOB, found to be in afib with RVR.  Workup for elevated trop, CP and CHF.  Pt s/p CABGx4 10/1.  Clinical Impression  Pt admitted with/for CABG x4.  Pt needing mod to max assist of 1-2 persons due to limited use of arms and recent R TKA.Marland Kitchen  Pt currently limited functionally due to the problems listed below.  (see problems list.)  Pt will benefit from PT to maximize function and safety to be able to get home safely with available assist.     Follow Up Recommendations Home health PT;Supervision/Assistance - 24 hour;Other (comment) (pt wants to work toward going home, cir if slow to improve)    Equipment Recommendations  Other (comment) (TBA)    Recommendations for Other Services       Precautions / Restrictions Precautions Precautions: Sternal;Fall Restrictions Weight Bearing Restrictions: Yes      Mobility  Bed Mobility Overal bed mobility: Needs Assistance Bed Mobility: Sit to Supine       Sit to supine: Mod assist;+2 for safety/equipment   General bed mobility comments: building momentum to get LE's in bed, sternal pre.  Transfers Overall transfer level: Needs assistance   Transfers: Sit to/from Stand;Stand Pivot Transfers Sit to Stand: Mod assist;+2 physical assistance Stand pivot transfers: Min assist       General transfer comment: cues for UE/sternal prec, assist to come forward and boost.  Steady assist during pivot.  Ambulation/Gait           Gait velocity interpretation: <1.31 ft/sec, indicative of household ambulator General Gait Details: pt able to step in place to warm up for transfer.  low amplitude steps.  Stairs            Wheelchair Mobility    Modified Rankin (Stroke Patients  Only)       Balance Overall balance assessment: Needs assistance   Sitting balance-Leahy Scale: Fair       Standing balance-Leahy Scale: Poor Standing balance comment: reliant on the AD and minor stability assist                             Pertinent Vitals/Pain Pain Assessment: 0-10 Pain Score: 6  Pain Location: sternal Pain Descriptors / Indicators: Grimacing;Guarding;Discomfort;Sharp;Sore Pain Intervention(s): Monitored during session;Premedicated before session;Limited activity within patient's tolerance    Home Living Family/patient expects to be discharged to:: Private residence Living Arrangements: Spouse/significant other Available Help at Discharge: Family;Available 24 hours/day Type of Home: House Home Access: Stairs to enter;Ramped entrance Entrance Stairs-Rails: Right;Left Entrance Stairs-Number of Steps: 5 Home Layout: One level Home Equipment: Walker - 2 wheels;Cane - single point      Prior Function Level of Independence: Independent               Hand Dominance   Dominant Hand: Right    Extremity/Trunk Assessment   Upper Extremity Assessment Upper Extremity Assessment: Defer to OT evaluation    Lower Extremity Assessment Lower Extremity Assessment: Generalized weakness       Communication   Communication: HOH  Cognition Arousal/Alertness: Awake/alert Behavior During Therapy: WFL for tasks assessed/performed Overall Cognitive Status: Within Functional Limits for tasks assessed  General Comments General comments (skin integrity, edema, etc.): sats 3LNC in the low 90's,  otherwise VSS    Exercises     Assessment/Plan    PT Assessment Patient needs continued PT services  PT Problem List Decreased strength;Decreased activity tolerance;Decreased mobility;Decreased balance;Decreased knowledge of use of DME;Cardiopulmonary status limiting activity;Pain       PT  Treatment Interventions DME instruction;Gait training;Functional mobility training;Therapeutic activities;Balance training;Patient/family education    PT Goals (Current goals can be found in the Care Plan section)  Acute Rehab PT Goals Patient Stated Goal: home as soon as able PT Goal Formulation: With patient Time For Goal Achievement: 06/06/20 Potential to Achieve Goals: Good    Frequency Min 3X/week   Barriers to discharge        Co-evaluation               AM-PAC PT "6 Clicks" Mobility  Outcome Measure Help needed turning from your back to your side while in a flat bed without using bedrails?: A Lot Help needed moving from lying on your back to sitting on the side of a flat bed without using bedrails?: Total Help needed moving to and from a bed to a chair (including a wheelchair)?: A Lot Help needed standing up from a chair using your arms (e.g., wheelchair or bedside chair)?: A Lot Help needed to walk in hospital room?: A Lot Help needed climbing 3-5 steps with a railing? : A Lot 6 Click Score: 11    End of Session   Activity Tolerance: Patient tolerated treatment well;Patient limited by pain Patient left: in bed;with call bell/phone within reach;with nursing/sitter in room Nurse Communication: Mobility status PT Visit Diagnosis: Other abnormalities of gait and mobility (R26.89);Pain;Muscle weakness (generalized) (M62.81) Pain - part of body:  (sternal)    Time: 4431-5400 PT Time Calculation (min) (ACUTE ONLY): 26 min   Charges:   PT Evaluation $PT Eval Moderate Complexity: 1 Mod PT Treatments $Therapeutic Activity: 8-22 mins        05/23/2020  Ginger Carne., PT Acute Rehabilitation Services [phone]  (pager) [phone]  (office)  Tessie Fass Yohanna Tow 05/23/2020, 1:25 PM

## 2020-05-23 NOTE — Progress Notes (Signed)
Cortrak Tube Team Note:  Consult received to place a Cortrak feeding tube.   Pt declines Cortrak placement. RN and RD both spoke with patient regarding Cortrak placement and pt does NOT want Cortrak placed at this time. Pt reports he may reconsider Cortrak on Friday (next available Cortrak day) if unable to advance diet/take adequate po.   If the tube becomes dislodged please keep the tube and contact the Cortrak team at [email or web address] (password [email or web address]) for replacement.  If after hours and replacement cannot be delayed, place a NG tube and confirm placement with an abdominal x-ray.    Kerman Passey MS, RDN, LDN, CNSC Registered Dietitian III Clinical Nutrition RD Pager and On-Call Pager Number Located in [place]

## 2020-05-24 ENCOUNTER — Inpatient Hospital Stay (HOSPITAL_COMMUNITY): Payer: No Typology Code available for payment source

## 2020-05-24 ENCOUNTER — Inpatient Hospital Stay: Payer: Self-pay

## 2020-05-24 DIAGNOSIS — I4891 Unspecified atrial fibrillation: Secondary | ICD-10-CM | POA: Diagnosis not present

## 2020-05-24 DIAGNOSIS — I214 Non-ST elevation (NSTEMI) myocardial infarction: Secondary | ICD-10-CM | POA: Diagnosis not present

## 2020-05-24 DIAGNOSIS — Z951 Presence of aortocoronary bypass graft: Secondary | ICD-10-CM | POA: Diagnosis not present

## 2020-05-24 DIAGNOSIS — I5021 Acute systolic (congestive) heart failure: Secondary | ICD-10-CM | POA: Diagnosis not present

## 2020-05-24 LAB — BASIC METABOLIC PANEL
Anion gap: 10 (ref 5–15)
BUN: 17 mg/dL (ref 8–23)
CO2: 32 mmol/L (ref 22–32)
Calcium: 7.8 mg/dL — ABNORMAL LOW (ref 8.9–10.3)
Chloride: 92 mmol/L — ABNORMAL LOW (ref 98–111)
Creatinine, Ser: 0.95 mg/dL (ref 0.61–1.24)
GFR calc non Af Amer: >60 mL/min (ref >60)
Glucose, Bld: 133 mg/dL — ABNORMAL HIGH (ref 70–99)
Potassium: 3.4 mmol/L — ABNORMAL LOW (ref 3.5–5.1)
Sodium: 134 mmol/L — ABNORMAL LOW (ref 135–145)

## 2020-05-24 LAB — CBC
HCT: 33.2 % — ABNORMAL LOW (ref 39.0–52.0)
Hemoglobin: 10.9 g/dL — ABNORMAL LOW (ref 13.0–17.0)
MCH: 28.3 pg (ref 26.0–34.0)
MCHC: 32.8 g/dL (ref 30.0–36.0)
MCV: 86.2 fL (ref 80.0–100.0)
Platelets: 138 10*3/uL — ABNORMAL LOW (ref 150–400)
RBC: 3.85 MIL/uL — ABNORMAL LOW (ref 4.22–5.81)
RDW: 13.9 % (ref 11.5–15.5)
WBC: 4.9 10*3/uL (ref 4.0–10.5)
nRBC: 0 % (ref 0.0–0.2)

## 2020-05-24 LAB — GLUCOSE, CAPILLARY
Glucose-Capillary: 118 mg/dL — ABNORMAL HIGH (ref 70–99)
Glucose-Capillary: 131 mg/dL — ABNORMAL HIGH (ref 70–99)
Glucose-Capillary: 120 mg/dL — ABNORMAL HIGH (ref 70–99)
Glucose-Capillary: 142 mg/dL — ABNORMAL HIGH (ref 70–99)
Glucose-Capillary: 119 mg/dL — ABNORMAL HIGH (ref 70–99)

## 2020-05-24 LAB — COOXEMETRY PANEL
Carboxyhemoglobin: 1.3 % (ref 0.5–1.5)
Methemoglobin: 0.9 % (ref 0.0–1.5)
O2 Saturation: 59.8 %
Total hemoglobin: 11.6 g/dL — ABNORMAL LOW (ref 12.0–16.0)

## 2020-05-24 MED ORDER — POTASSIUM CHLORIDE CRYS ER 20 MEQ PO TBCR
40.0000 meq | EXTENDED_RELEASE_TABLET | Freq: Once | ORAL | Status: AC
  Administered 2020-05-24: 40 meq via ORAL
  Filled 2020-05-24: qty 2

## 2020-05-24 MED ORDER — BISACODYL 10 MG RE SUPP
10.0000 mg | Freq: Once | RECTAL | Status: DC
  Filled 2020-05-24: qty 1

## 2020-05-24 MED ORDER — SORBITOL 70 % SOLN
15.0000 mL | Freq: Every day | Status: DC | PRN
  Administered 2020-05-24: 15 mL via ORAL
  Filled 2020-05-24: qty 30

## 2020-05-24 MED ORDER — POTASSIUM CHLORIDE 10 MEQ/50ML IV SOLN
10.0000 meq | INTRAVENOUS | Status: AC
  Administered 2020-05-24 (×3): 10 meq via INTRAVENOUS
  Filled 2020-05-24 (×3): qty 50

## 2020-05-24 MED ORDER — SPIRONOLACTONE 12.5 MG HALF TABLET
12.5000 mg | ORAL_TABLET | Freq: Every day | ORAL | Status: DC
  Administered 2020-05-24: 12.5 mg via ORAL
  Filled 2020-05-24 (×2): qty 1

## 2020-05-24 MED ORDER — ASPIRIN EC 81 MG PO TBEC
81.0000 mg | DELAYED_RELEASE_TABLET | Freq: Every day | ORAL | Status: DC
  Administered 2020-05-24 – 2020-05-30 (×7): 81 mg via ORAL
  Filled 2020-05-24 (×7): qty 1

## 2020-05-24 MED ORDER — APIXABAN 5 MG PO TABS
5.0000 mg | ORAL_TABLET | Freq: Two times a day (BID) | ORAL | Status: DC
  Administered 2020-05-24 – 2020-05-30 (×12): 5 mg via ORAL
  Filled 2020-05-24 (×13): qty 1

## 2020-05-24 MED ORDER — PANTOPRAZOLE SODIUM 40 MG PO TBEC
40.0000 mg | DELAYED_RELEASE_TABLET | Freq: Every day | ORAL | Status: DC
  Administered 2020-05-24 – 2020-05-30 (×7): 40 mg via ORAL
  Filled 2020-05-24 (×7): qty 1

## 2020-05-24 MED ORDER — SODIUM CHLORIDE 0.9% FLUSH
10.0000 mL | Freq: Two times a day (BID) | INTRAVENOUS | Status: DC
  Administered 2020-05-24 – 2020-05-28 (×6): 10 mL
  Administered 2020-05-29: 20 mL
  Administered 2020-05-29 – 2020-05-30 (×2): 10 mL

## 2020-05-24 MED ORDER — SODIUM CHLORIDE 0.9% FLUSH: 10.0000 mL | INTRAVENOUS | Status: DC | PRN

## 2020-05-24 MED ORDER — LOSARTAN POTASSIUM 25 MG PO TABS
12.5000 mg | ORAL_TABLET | Freq: Every day | ORAL | Status: DC
  Administered 2020-05-24: 12.5 mg via ORAL
  Filled 2020-05-24 (×3): qty 1

## 2020-05-24 NOTE — Evaluation (Signed)
Clinical/Bedside Swallow Evaluation Patient Details  Name: Eric Stokes MRN: [id number] Date of Birth: [birth year]  Today's Date: 05/24/2020 Time: SLP Start Time (ACUTE ONLY): 2025 SLP Stop Time (ACUTE ONLY): 1641 SLP Time Calculation (min) (ACUTE ONLY): 10 min  Past Medical History:  Past Medical History:  Diagnosis Date  . Aortic stenosis, mild    noted on ECHO  . Asthma   . Benign essential hypertension, on Norvasc and Amlodipine 12/21/2008   Followed by [facility]   . BPH (benign prostatic hyperplasia) 09/03/2012  . Carotid bruit present 06/15/2018   2015 Carotid Dopplers:  Essentially normal carotid arteries, with very slight hard plaque in the proximal ICA's and serpentine distal vessels. 1-39% bilateral ICA stenosis. Patent vertebral arteries with antegrade flow. Normal subclavian arteries, bilaterally.  . Colonic polyp 01/09/2020  . COPD (chronic obstructive pulmonary disease) ([place])   . Dyslipidemia 12/21/2008   Qualifier: Diagnosis of  By: Percival Spanish, MD, Farrel Gordon    . Essential hypertension, benign 12/21/2008   Qualifier: Diagnosis of  By: Percival Spanish, MD, Farrel Gordon    . Former smoker, stopped smoking in distant past 03/15/2018  . Hematuria 09/16/2011  . History of migraine   . OA (osteoarthritis)   . Obese   . OSA on CPAP 09/25/2015   Diagn[facility] at the [facility]   . Prostate cancer ([place]) 11/30/2019  . PVC's (premature ventricular contractions)   . Seasonal allergies   . Sinus bradycardia   . Squamous cell carcinoma of skin   . TIA (transient ischemic attack)    questionable  . Vitamin B 12 deficiency   . Vitamin D deficiency 11/26/2010   Past Surgical History:  Past Surgical History:  Procedure Laterality Date  . CHOLECYSTECTOMY  2001  . CLIPPING OF ATRIAL APPENDAGE Left 05/18/2020   Procedure: CLIPPING OF ATRIAL APPENDAGE USING ATRICURE 8 MM ATRICLIP;  Surgeon: Wonda Olds, MD;  Location: [place];  Service: Open Heart Surgery;  Laterality:  Left;  . COLONOSCOPY    . CORONARY ARTERY BYPASS GRAFT N/A 05/18/2020   Procedure: CORONARY ARTERY BYPASS GRAFTING (CABG) TIMES FOUR USING INTERNAL BILATERAL MAMMARY ARTERIES AND HARVESTED LEFT RADIAL ARTERY.;  Surgeon: Wonda Olds, MD;  Location: [place];  Service: Open Heart Surgery;  Laterality: N/A;  CORONARY ENDARTERECTOMY PERFORMED DURING SURGERY   . IABP INSERTION N/A 05/17/2020   Procedure: IABP INSERTION;  Surgeon: Jolaine Artist, MD;  Location: [place] [facility];  Service: Cardiovascular;  Laterality: N/A;  . KNEE ARTHROPLASTY Right 03/14/2020   Procedure: COMPUTER ASSISTED TOTAL KNEE ARTHROPLASTY;  Surgeon: Rod Can, MD;  Location: [facility];  Service: Orthopedics;  Laterality: Right;  Marland Kitchen MAZE N/A 05/18/2020   Procedure: MAZE USING ATRICURE ISOLATOR CLAMP OLL2;  Surgeon: Wonda Olds, MD;  Location: [place];  Service: Open Heart Surgery;  Laterality: N/A;  . RADIAL ARTERY HARVEST Left 05/18/2020   Procedure: RADIAL ARTERY HARVEST;  Surgeon: Wonda Olds, MD;  Location: [place];  Service: Open Heart Surgery;  Laterality: Left;  . RIGHT HEART CATH N/A 05/17/2020   Procedure: RIGHT HEART CATH;  Surgeon: Jolaine Artist, MD;  Location: [place] [facility];  Service: Cardiovascular;  Laterality: N/A;  . RIGHT/LEFT HEART CATH AND CORONARY ANGIOGRAPHY N/A 05/17/2020   Procedure: RIGHT/LEFT HEART CATH AND CORONARY ANGIOGRAPHY;  Surgeon: Lorretta Harp, MD;  Location: [place] [facility];  Service: Cardiovascular;  Laterality: N/A;  . TEE WITHOUT CARDIOVERSION N/A 05/18/2020   Procedure: TRANSESOPHAGEAL ECHOCARDIOGRAM (TEE);  Surgeon: Wonda Olds, MD;  Location: [place];  Service: Open Heart Surgery;  Laterality: N/A;  . TONSILLECTOMY AND ADENOIDECTOMY  1952   HPI:  Pt is a 74 y/o male with h/o COPD, sinus brady, PVC's, HTN, OSA, prostate CA, recent R TKA admitted with CP and SOB, found to be in afib with RVR.  Workup for elevated trop, CP and CHF.  Pt s/p CABGx4 10/1. ETT  10/1-10/5.   Assessment / Plan / Recommendation Clinical Impression  Pt's voice is mildly hoarse, but he believes that it has been improving each day since extubation. He denies any dysphagia on clear liquid diet, and no overt signs of aspiration are observed. He wants very little to eat or drink because of feeling like his stomach is unsettled (more so than nauseous), or like he has air in his esophagus that doesn't quite come out. No evidence of oropharyngeal dysphagia is observed today. Will try to f/u briefly when advanced to more solid textures.   SLP Visit Diagnosis: Dysphagia, unspecified (R13.10)    Aspiration Risk  Mild aspiration risk    Diet Recommendation Thin liquid (advancing to more solids as tolerated)   Liquid Administration via: Cup;Straw Medication Administration: Whole meds with liquid Supervision: Patient able to self feed Postural Changes: Seated upright at 90 degrees    Other  Recommendations Oral Care Recommendations: Oral care BID   Follow up Recommendations None      Frequency and Duration min 2x/week  1 week       Prognosis Prognosis for Safe Diet Advancement: Good      Swallow Study   General HPI: Pt is a 74 y/o male with h/o COPD, sinus brady, PVC's, HTN, OSA, prostate CA, recent R TKA admitted with CP and SOB, found to be in afib with RVR.  Workup for elevated trop, CP and CHF.  Pt s/p CABGx4 10/1. ETT 10/1-10/5. Type of Study: Bedside Swallow Evaluation Previous Swallow Assessment: none in chart Diet Prior to this Study: Thin liquids (CLD) Temperature Spikes Noted: No Respiratory Status: Nasal cannula History of Recent Intubation: Yes Length of Intubations (days): 4 days Date extubated: 05/22/20 Behavior/Cognition: Alert;Cooperative;Pleasant mood Oral Cavity Assessment: Within Functional Limits Oral Care Completed by SLP: No Oral Cavity - Dentition: Adequate natural dentition Vision: Functional for self-feeding Self-Feeding Abilities: Able  to feed self;Needs set up Patient Positioning: Upright in chair Baseline Vocal Quality: Hoarse (maybe mildly so) Volitional Swallow: Able to elicit    Oral/Motor/Sensory Function Overall Oral Motor/Sensory Function: Within functional limits   Ice Chips Ice chips: Not tested   Thin Liquid Thin Liquid: Within functional limits Presentation: Self Fed;Straw    Nectar Thick Nectar Thick Liquid: Not tested   Honey Thick Honey Thick Liquid: Not tested   Puree Puree: Within functional limits (jello) Presentation: Self Fed;Spoon   Solid     Solid: Not tested      Osie Bond., M.A. [place] Pager [phone] Office [phone]  05/24/2020,4:56 PM

## 2020-05-24 NOTE — Progress Notes (Signed)
   05/24/20 1400  Clinical Encounter Type  Visited With Family  Visit Type Initial  Referral From Nurse  Consult/Referral To Chaplain  The chaplain met with patient's wife Margaretha Sheffield). The chaplain gave the wife POA paperwork and explained the components. The chaplain will follow up as needed

## 2020-05-24 NOTE — Progress Notes (Addendum)
Patient ID: Eric Stokes, male   DOB: December 02, 1945, 74 y.o.   MRN: [id number] P    Advanced Heart Failure Rounding Note  PCP-Cardiologist: No primary care provider on file.   Subjective:    Taken to OR 10/1 for CABG + MAZE and LAA clip.   Remains on milrinone 0.125 mcg + lasix drip 6 mg per hour. Co-ox marginal at 60%.   Wt down an additional 12 lb. Remains ~3 lb above preop wt. No CVP setup.   Remains in NSR.   OOB sitting up in chair. No CP or dyspnea. Appetite is poor.    Objective:   Weight Range: 109.5 kg Body mass index is 34.64 kg/m.   Vital Signs:   Temp:  [97.7 F (36.5 C)-99.1 F (37.3 C)] 99 F (37.2 C) (10/07 0700) Pulse Rate:  [80-96] 84 (10/07 0700) Resp:  [15-29] 20 (10/07 0700) BP: (107-136)/(66-91) 118/69 (10/07 0700) SpO2:  [91 %-99 %] 98 % (10/07 0700) Arterial Line BP: (146-151)/(56-67) 146/67 (10/06 0805) Weight:  [109.5 kg] 109.5 kg (10/07 0500) Last BM Date:  (prior to surgery)  Weight change: Filed Weights   05/22/20 0409 05/23/20 0500 05/24/20 0500  Weight: 115.9 kg 114.9 kg 109.5 kg    Intake/Output:   Intake/Output Summary (Last 24 hours) at 05/24/2020 0751 Last data filed at 05/24/2020 0700 Gross per 24 hour  Intake 1111.75 ml  Output 1090 ml  Net 21.75 ml      Physical Exam   General:  Obese elderly male, sitting up in chair. No resp difficulty HEENT: normal Neck: supple. JVP difficult to assess. + Rt IJ swan transducer. Carotids 2+ bilat; no bruits. No lymphadenopathy or thryomegaly appreciated. RIJ  Cor: PMI nondisplaced. Regular rate & rhythm. No rubs, gallops or murmurs. Sternal site ok  Lungs: decreased BS on the rt, basilar crackles on the left  Abdomen: obese, soft, nontender, nondistended. No hepatosplenomegaly. No bruits or masses. Good bowel sounds. Extremities: no cyanosis, clubbing, rash, trac= 1+ bilateral pretibial edema.   Neuro: alert & orientedx3, cranial nerves grossly intact. moves all 4 extremities w/o  difficulty. Affect flat     Telemetry   SR 90s with PVCs   Labs    CBC Recent Labs    05/23/20 0441 05/24/20 0347  WBC 4.7 4.9  HGB 10.7* 10.9*  HCT 31.5* 33.2*  MCV 86.3 86.2  PLT 110* 836*   Basic Metabolic Panel Recent Labs    05/22/20 0500 05/22/20 0853 05/23/20 0441 05/24/20 0347  NA 134*   < > 133* 134*  K 3.7   < > 3.6 3.4*  CL 96*   < > 93* 92*  CO2 28   < > 28 32  GLUCOSE 145*   < > 126* 133*  BUN 11   < > 15 17  CREATININE 0.85   < > 0.97 0.95  CALCIUM 7.8*   < > 7.7* 7.8*  MG 2.0  --   --   --    < > = values in this interval not displayed.   Liver Function Tests No results for input(s): AST, ALT, ALKPHOS, BILITOT, PROT, ALBUMIN in the last 72 hours. No results for input(s): LIPASE, AMYLASE in the last 72 hours. Cardiac Enzymes No results for input(s): CKTOTAL, CKMB, CKMBINDEX, TROPONINI in the last 72 hours.  BNP: BNP (last 3 results) Recent Labs    05/16/20 1155  BNP 415.7*    ProBNP (last 3 results) No results for input(s): PROBNP in the  last 8760 hours.   D-Dimer No results for input(s): DDIMER in the last 72 hours. Hemoglobin A1C No results for input(s): HGBA1C in the last 72 hours. Fasting Lipid Panel No results for input(s): CHOL, HDL, LDLCALC, TRIG, CHOLHDL, LDLDIRECT in the last 72 hours. Thyroid Function Tests No results for input(s): TSH, T4TOTAL, T3FREE, THYROIDAB in the last 72 hours.  Invalid input(s): FREET3  Other results:   Imaging    No results found.   Medications:     Scheduled Medications: . aspirin EC  325 mg Oral Daily   Or  . aspirin  324 mg Per Tube Daily  . bisacodyl  10 mg Oral Daily   Or  . bisacodyl  10 mg Rectal Daily  . Chlorhexidine Gluconate Cloth  6 each Topical Daily  . colchicine  0.3 mg Oral BID  . docusate sodium  200 mg Oral Daily  . feeding supplement  1 Container Oral TID BM  . hydrALAZINE  25 mg Oral Q6H  . influenza vaccine adjuvanted  0.5 mL Intramuscular Tomorrow-1000   . insulin aspart  0-15 Units Subcutaneous Q4H  . latanoprost  1 drop Both Eyes QHS  . mouth rinse  15 mL Mouth Rinse BID  . pantoprazole (PROTONIX) IV  40 mg Intravenous QHS  . QUEtiapine  25 mg Oral QHS  . rosuvastatin  20 mg Oral Daily  . sodium chloride flush  3 mL Intravenous Q12H    Infusions: . sodium chloride    . amiodarone 30 mg/hr (05/24/20 0700)  . furosemide (LASIX) infusion 6 mg/hr (05/24/20 0700)  . milrinone 0.125 mcg/kg/min (05/24/20 0700)  . potassium chloride 10 mEq (05/24/20 0737)    PRN Medications: levalbuterol, metoprolol tartrate, morphine injection, ondansetron (ZOFRAN) IV, oxyCODONE, sodium chloride flush, sodium chloride flush, traMADol    Assessment/Plan   1. CAD/NSTEMI with critcal LM disease - HS Trop [phone]. LHC with severe multivessel disease.  - S/p CABG 10/1 - ASA/Crestor 20 (myalgias with atorvastatin).   2. Acute Systolic Heart Failure>>Cardiogenic Shock  - ECHO in 2020 showed EF 60-65%.  - ECHO this admit showed EF is down to 25-30%. RV normal.  Repeat echo post-op on 10/2 showed EF 25%, normal RV, no pericardial effusion.  - Ischemic CMP. Cath this admit w/ severe MVCAD w/ critical LM disease, s/p CABG as above.  - IABP out. Off epi and Norepi.  - Remains on milrinone 0.125 mcg. Co-ox marginal 60%. Continue at current rate today, try to wean off tomorrow.  - nearing preop wt. Continue lasix gtt at 6 ml/hr today and plan to transition to PO tomorrow  - Start spiro 12.5 mg today  - Renal function stable.   3. A fib RVR  - s/p MAZE w/ LAA Clip  - maintaining NSR.  - on amio gtt at 30 mg/hr while on milrinone  - per CT surgery, ok to resume a/c today, start Eliquis  5 bid   4. H/o Prostate Cancer - coudet catheter in place due to massive prostate enlargement   5. Hypokalemia - K 3.4 - give 40 mEq KCl - start spiro 12.5    Ambulate today.   Length of Stay: 8  Brittainy Simmons, PA-C  7:51 AM   Patient seen and  examined with the above-signed Advanced Practice Provider and/or Housestaff. I personally reviewed laboratory data, imaging studies and relevant notes. I independently examined the patient and formulated the important aspects of the plan. I have edited the note to reflect any of my  changes or salient points. I have personally discussed the plan with the patient and/or family.  Sitting up in chair. Feels weak. Poor appetite. No BM yet. Mild dyspnea. No CP.Co-ox 60% on milrinone.   General:  Sitting up in chair. No resp difficulty HEENT: normal Neck: supple. RIJ introducer Carotids 2+ bilat; no bruits. No lymphadenopathy or thryomegaly appreciated. Cor: Sternal dressing ok PMI nondisplaced. Regular rate & rhythm. No rubs, gallops or murmurs. Lungs: decreased at bases Abdomen: soft, nontender, + distended. No hepatosplenomegaly. No bruits or masses. Hypoactive bowel sounds. Extremities: no cyanosis, clubbing, rash, 2+ edema Neuro: alert & orientedx3, cranial nerves grossly intact. moves all 4 extremities w/o difficulty. Affect pleasant  Progressing slowly but steadily post-CABG. Continue milrinone and IV lasix one more day. Supp K. Start spiro. Change hydral to losartan. Place PICC. Pull introducer. Place UNNA boots.   Give sorbitol. Mobilize. Educated on IS personally.   Glori Bickers, MD  8:55 AM

## 2020-05-24 NOTE — Evaluation (Signed)
Occupational Therapy Evaluation Patient Details Name: Eric Stokes MRN: [id number] DOB: 1946-02-17 Today's Date: 05/24/2020    History of Present Illness pt is a 74 y/o mal with h/o sinus brady, PVC's, HTN OSA, prostate CA, recent R TKA admitted with CP and SOB, found to be in afib with RVR.  Workup for elevated trop, CP and CHF.  Pt s/p CABGx4 10/1.   Clinical Impression   Pt had returned to independence in ambulation and self care since his recent R TKA. He presents with pain, generalized weakness and decreased activity tolerance. He requires min to total assist for ADL. Pt will rely on his wife to assist him with LB ADL. Began educating in energy conservation and in sternal precautions. Pt receptive. Will follow acutely. Anticipate pt will be able to return home when medically ready.    Follow Up Recommendations  Home health OT;Supervision/Assistance - 24 hour    Equipment Recommendations  3 in 1 bedside commode    Recommendations for Other Services       Precautions / Restrictions Precautions Precautions: Sternal;Fall Precaution Comments: educated in "move in the tube" and to avoid push/pull/lifting with UEs      Mobility Bed Mobility               General bed mobility comments: pt up in recliner  Transfers Overall transfer level: Needs assistance Equipment used: 2 person hand held assist Transfers: Sit to/from Stand Sit to Stand: Mod assist;+2 physical assistance         General transfer comment: cues for hands on knees, use of momentum, assist to rise and steady    Balance Overall balance assessment: Needs assistance   Sitting balance-Leahy Scale: Fair       Standing balance-Leahy Scale: Poor                             ADL either performed or assessed with clinical judgement   ADL Overall ADL's : Needs assistance/impaired Eating/Feeding: Sitting;Minimal assistance   Grooming: Minimal assistance;Sitting   Upper Body Bathing:  Moderate assistance;Sitting   Lower Body Bathing: Sit to/from stand;Total assistance;+2 for physical assistance   Upper Body Dressing : Moderate assistance;Sitting   Lower Body Dressing: Sit to/from stand;Total assistance;+2 for physical assistance       Toileting- Clothing Manipulation and Hygiene: +2 for physical assistance;Total assistance;Sit to/from stand         General ADL Comments: Began educating pt in energy conservation.      Vision Patient Visual Report: No change from baseline       Perception     Praxis      Pertinent Vitals/Pain Pain Assessment: Faces Faces Pain Scale: Hurts little more Pain Location: throat Pain Descriptors / Indicators: Sore Pain Intervention(s): Monitored during session;Repositioned     Hand Dominance Right   Extremity/Trunk Assessment Upper Extremity Assessment Upper Extremity Assessment: Generalized weakness   Lower Extremity Assessment Lower Extremity Assessment: Defer to PT evaluation       Communication Communication Communication: HOH   Cognition Arousal/Alertness: Awake/alert Behavior During Therapy: WFL for tasks assessed/performed Overall Cognitive Status: Within Functional Limits for tasks assessed                                 General Comments: pt speaking minimally, reports discomfort in his throat   General Comments       Exercises  Shoulder Instructions      Home Living Family/patient expects to be discharged to:: Private residence Living Arrangements: Spouse/significant other Available Help at Discharge: Family;Available 24 hours/day Type of Home: House Home Access: Stairs to enter;Ramped entrance Entrance Stairs-Number of Steps: 5 Entrance Stairs-Rails: Right;Left Home Layout: One level     Bathroom Shower/Tub: Occupational psychologist: Handicapped height     Home Equipment: Environmental consultant - 2 wheels;Cane - single point;Shower seat          Prior  Functioning/Environment Level of Independence: Independent                 OT Problem List: Decreased strength;Decreased activity tolerance;Impaired balance (sitting and/or standing);Decreased knowledge of precautions;Pain;Cardiopulmonary status limiting activity;Obesity      OT Treatment/Interventions: Self-care/ADL training;Energy conservation;DME and/or AE instruction;Patient/family education;Balance training;Therapeutic activities    OT Goals(Current goals can be found in the care plan section) Acute Rehab OT Goals Patient Stated Goal: home as soon as able OT Goal Formulation: With patient Time For Goal Achievement: 06/07/20 Potential to Achieve Goals: Good ADL Goals Pt Will Perform Grooming: with supervision;standing Pt Will Perform Upper Body Bathing: with min assist;sitting Pt Will Perform Upper Body Dressing: with set-up;sitting Pt Will Transfer to Toilet: with supervision;ambulating;bedside commode (over toilet) Pt Will Perform Toileting - Clothing Manipulation and hygiene: with min assist;sit to/from stand Additional ADL Goal #1: Pt will perform bed mobility with min assist in preparation for ADL. Additional ADL Goal #2: Pt will adhere to sternal precautions during ADL and mobility. Additional ADL Goal #3: Pt will state at least 3 energy conservation strategies during ADL and mobility as instructed.  OT Frequency: Min 2X/week   Barriers to D/C:            Co-evaluation              AM-PAC OT "6 Clicks" Daily Activity     Outcome Measure Help from another person eating meals?: A Little Help from another person taking care of personal grooming?: A Little Help from another person toileting, which includes using toliet, bedpan, or urinal?: Total Help from another person bathing (including washing, rinsing, drying)?: A Lot Help from another person to put on and taking off regular upper body clothing?: A Lot Help from another person to put on and taking off  regular lower body clothing?: Total 6 Click Score: 12   End of Session Equipment Utilized During Treatment: Oxygen  Activity Tolerance: Patient tolerated treatment well Patient left: in chair;with call bell/phone within reach;with family/visitor present (ortho techs in room)  OT Visit Diagnosis: Unsteadiness on feet (R26.81);Other abnormalities of gait and mobility (R26.89);Pain;Muscle weakness (generalized) (M62.81)                Time: 5009-3818 OT Time Calculation (min): 20 min Charges:  OT General Charges $OT Visit: 1 Visit OT Evaluation $OT Eval Moderate Complexity: 1 Mod  {Ainhoa Rallo, Haze Boyden 05/24/2020, 2:10 PM  Nestor Lewandowsky, OTR/L Acute Rehabilitation Services Pager: [phone] Office: [phone]

## 2020-05-24 NOTE — Progress Notes (Signed)
ANTICOAGULATION CONSULT NOTE - Initial Consult  Pharmacy Consult for apixaban Indication: atrial fibrillation  Allergies  Allergen Reactions  . Tamsulosin Other (See Comments)    dizziness  . Cipro [Ciprofloxacin Hcl] Swelling    Knee swelling, joint pain  . Lipitor [Atorvastatin] Other (See Comments)    Leg Cramps  . Lisinopril Cough    Patient Measurements: Height: 5\' 10"  (177.8 cm) Weight: 109.5 kg (241 lb 6.5 oz) IBW/kg (Calculated) : 73  Vital Signs: Temp: 99 F (37.2 C) (10/07 0700) Temp Source: Bladder (10/07 0400) BP: 118/69 (10/07 0700) Pulse Rate: 84 (10/07 0700)  Labs: Recent Labs    05/22/20 0500 05/22/20 0500 05/22/20 0853 05/22/20 0853 05/23/20 0441 05/24/20 0347  HGB 10.5*   < > 10.5*   < > 10.7* 10.9*  HCT 31.2*   < > 31.0*  --  31.5* 33.2*  PLT 103*  --   --   --  110* 138*  CREATININE 0.85  --   --   --  0.97 0.95   < > = values in this interval not displayed.    Estimated Creatinine Clearance: 84.5 mL/min (by C-G formula based on SCr of 0.95 mg/dL).   Medical History: Past Medical History:  Diagnosis Date  . Aortic stenosis, mild    noted on ECHO  . Asthma   . Benign essential hypertension, on Norvasc and Amlodipine 12/21/2008   Followed by [facility]   . BPH (benign prostatic hyperplasia) 09/03/2012  . Carotid bruit present 06/15/2018   2015 Carotid Dopplers:  Essentially normal carotid arteries, with very slight hard plaque in the proximal ICA's and serpentine distal vessels. 1-39% bilateral ICA stenosis. Patent vertebral arteries with antegrade flow. Normal subclavian arteries, bilaterally.  . Colonic polyp 01/09/2020  . COPD (chronic obstructive pulmonary disease) ([place])   . Dyslipidemia 12/21/2008   Qualifier: Diagnosis of  By: Percival Spanish, MD, Farrel Gordon    . Essential hypertension, benign 12/21/2008   Qualifier: Diagnosis of  By: Percival Spanish, MD, Farrel Gordon    . Former smoker, stopped smoking in distant past 03/15/2018  . Hematuria  09/16/2011  . History of migraine   . OA (osteoarthritis)   . Obese   . OSA on CPAP 09/25/2015   Diagnosed at the [facility] 2016   . Prostate cancer ([place]) 11/30/2019  . PVC's (premature ventricular contractions)   . Seasonal allergies   . Sinus bradycardia   . Squamous cell carcinoma of skin   . TIA (transient ischemic attack)    questionable  . Vitamin B 12 deficiency   . Vitamin D deficiency 11/26/2010     Assessment: 63 yoM admitted with AFib RVR and ACS s/p CABG and MAZE 10/1. Pt previously on apixaban PTA for VTE ppx after orthopedic surgery. Pharmacy consulted to begin apixaban for AFib. Age, wt, Cr meet 5mg  BID dosing.  Goal of Therapy:  Monitor platelets by anticoagulation protocol: Yes   Plan:  Apixaban 5mg  BID Pharmacy will sign off, reconsult if needed  Arrie Senate, PharmD, BCPS Clinical Pharmacist [phone] Please check AMION for all [facility] Pharmacy numbers 05/24/2020

## 2020-05-24 NOTE — Progress Notes (Signed)
Orthopedic Tech Progress Note Patient Details:  Eric Stokes 15-Jan-1946 [id number]  Ortho Devices Type of Ortho Device: Louretta Parma boot Ortho Device/Splint Location: BLE Ortho Device/Splint Interventions: Ordered, Application   Post Interventions Patient Tolerated: Well Instructions Provided: Care of device, Poper ambulation with device   [name] 05/24/2020, 2:16 PM

## 2020-05-24 NOTE — Progress Notes (Signed)
CT surgery p.m. Rounds  Status post CABG with Maze procedure PICC line being placed this evening Pericardial friction rub on exam Neuro intact, appropriate breathing comfortably  Blood pressure 125/67, pulse 80, temperature 99 F (37.2 C), temperature source Bladder, resp. rate 19, height 5\' 10"  (1.778 m), weight 109.5 kg, SpO2 98 %.

## 2020-05-24 NOTE — Progress Notes (Signed)
Peripherally Inserted Central Catheter Placement  The IV Nurse has discussed with the patient and/or persons authorized to consent for the patient, the purpose of this procedure and the potential benefits and risks involved with this procedure.  The benefits include less needle sticks, lab draws from the catheter, and the patient may be discharged home with the catheter. Risks include, but not limited to, infection, bleeding, blood clot (thrombus formation), and puncture of an artery; nerve damage and irregular heartbeat and possibility to perform a PICC exchange if needed/ordered by physician.  Alternatives to this procedure were also discussed.  Bard Power PICC patient education guide, fact sheet on infection prevention and patient information card has been provided to patient /or left at bedside.    PICC Placement Documentation  PICC Triple Lumen 05/24/20 PICC Right Brachial 46 cm 1 cm (Active)  Indication for Insertion or Continuance of Line Vasoactive infusions 05/24/20 2150  Exposed Catheter (cm) 1 cm 05/24/20 2150  Site Assessment Clean;Dry;Intact 05/24/20 2150  Lumen #1 Status Flushed;Saline locked;Blood return noted 05/24/20 2150  Lumen #2 Status Flushed;Saline locked;Blood return noted 05/24/20 2150  Lumen #3 Status Flushed;Saline locked;Blood return noted 05/24/20 2150  Dressing Type Transparent 05/24/20 2150  Dressing Status Clean;Dry;Intact 05/24/20 2150  Antimicrobial disc in place? Not Applicable 56/38/75 6433  Dressing Intervention New dressing 05/24/20 2150  Dressing Change Due 05/31/20 05/24/20 2150       Gordan Payment 05/24/2020, 9:51 PM

## 2020-05-25 ENCOUNTER — Inpatient Hospital Stay (HOSPITAL_COMMUNITY): Payer: No Typology Code available for payment source

## 2020-05-25 DIAGNOSIS — Z951 Presence of aortocoronary bypass graft: Secondary | ICD-10-CM | POA: Diagnosis not present

## 2020-05-25 DIAGNOSIS — I214 Non-ST elevation (NSTEMI) myocardial infarction: Secondary | ICD-10-CM | POA: Diagnosis not present

## 2020-05-25 DIAGNOSIS — I4891 Unspecified atrial fibrillation: Secondary | ICD-10-CM | POA: Diagnosis not present

## 2020-05-25 DIAGNOSIS — I5021 Acute systolic (congestive) heart failure: Secondary | ICD-10-CM | POA: Diagnosis not present

## 2020-05-25 LAB — CBC
HCT: 33 % — ABNORMAL LOW (ref 39.0–52.0)
Hemoglobin: 10.6 g/dL — ABNORMAL LOW (ref 13.0–17.0)
MCH: 28.1 pg (ref 26.0–34.0)
MCHC: 32.1 g/dL (ref 30.0–36.0)
MCV: 87.5 fL (ref 80.0–100.0)
Platelets: 178 10*3/uL (ref 150–400)
RBC: 3.77 MIL/uL — ABNORMAL LOW (ref 4.22–5.81)
RDW: 14.1 % (ref 11.5–15.5)
WBC: 5.7 10*3/uL (ref 4.0–10.5)
nRBC: 0 % (ref 0.0–0.2)

## 2020-05-25 LAB — COOXEMETRY PANEL
Carboxyhemoglobin: 1.4 % (ref 0.5–1.5)
Methemoglobin: 0.8 % (ref 0.0–1.5)
O2 Saturation: 61.2 %
Total hemoglobin: 10.4 g/dL — ABNORMAL LOW (ref 12.0–16.0)

## 2020-05-25 LAB — BASIC METABOLIC PANEL
Anion gap: 10 (ref 5–15)
BUN: 16 mg/dL (ref 8–23)
CO2: 32 mmol/L (ref 22–32)
Calcium: 8 mg/dL — ABNORMAL LOW (ref 8.9–10.3)
Chloride: 91 mmol/L — ABNORMAL LOW (ref 98–111)
Creatinine, Ser: 0.86 mg/dL (ref 0.61–1.24)
GFR calc non Af Amer: >60 mL/min (ref >60)
Glucose, Bld: 121 mg/dL — ABNORMAL HIGH (ref 70–99)
Potassium: 3.4 mmol/L — ABNORMAL LOW (ref 3.5–5.1)
Sodium: 133 mmol/L — ABNORMAL LOW (ref 135–145)

## 2020-05-25 LAB — GLUCOSE, CAPILLARY
Glucose-Capillary: 106 mg/dL — ABNORMAL HIGH (ref 70–99)
Glucose-Capillary: 110 mg/dL — ABNORMAL HIGH (ref 70–99)
Glucose-Capillary: 114 mg/dL — ABNORMAL HIGH (ref 70–99)
Glucose-Capillary: 121 mg/dL — ABNORMAL HIGH (ref 70–99)
Glucose-Capillary: 121 mg/dL — ABNORMAL HIGH (ref 70–99)
Glucose-Capillary: 125 mg/dL — ABNORMAL HIGH (ref 70–99)
Glucose-Capillary: 130 mg/dL — ABNORMAL HIGH (ref 70–99)

## 2020-05-25 MED ORDER — SPIRONOLACTONE 25 MG PO TABS
25.0000 mg | ORAL_TABLET | Freq: Every day | ORAL | Status: DC
  Administered 2020-05-25 – 2020-05-30 (×6): 25 mg via ORAL
  Filled 2020-05-25 (×6): qty 1

## 2020-05-25 MED ORDER — SACUBITRIL-VALSARTAN 24-26 MG PO TABS
1.0000 | ORAL_TABLET | Freq: Two times a day (BID) | ORAL | Status: DC
  Administered 2020-05-25 – 2020-05-30 (×11): 1 via ORAL
  Filled 2020-05-25 (×12): qty 1

## 2020-05-25 MED ORDER — ENSURE ENLIVE PO LIQD
237.0000 mL | Freq: Two times a day (BID) | ORAL | Status: DC
  Administered 2020-05-25 – 2020-05-30 (×3): 237 mL via ORAL

## 2020-05-25 MED ORDER — METOLAZONE 2.5 MG PO TABS
2.5000 mg | ORAL_TABLET | Freq: Once | ORAL | Status: AC
  Administered 2020-05-25: 2.5 mg via ORAL
  Filled 2020-05-25: qty 1

## 2020-05-25 MED ORDER — PROMETHAZINE HCL 25 MG/ML IJ SOLN
12.5000 mg | Freq: Once | INTRAMUSCULAR | Status: AC
  Administered 2020-05-25: 12.5 mg via INTRAVENOUS
  Filled 2020-05-25: qty 1

## 2020-05-25 MED ORDER — POTASSIUM CHLORIDE CRYS ER 20 MEQ PO TBCR
40.0000 meq | EXTENDED_RELEASE_TABLET | ORAL | Status: AC
  Administered 2020-05-25 (×2): 40 meq via ORAL
  Filled 2020-05-25 (×2): qty 2

## 2020-05-25 MED ORDER — SORBITOL 70 % SOLN
30.0000 mL | Freq: Once | Status: AC
  Administered 2020-05-25: 30 mL via ORAL
  Filled 2020-05-25: qty 30

## 2020-05-25 MED ORDER — POTASSIUM CHLORIDE CRYS ER 20 MEQ PO TBCR
20.0000 meq | EXTENDED_RELEASE_TABLET | ORAL | Status: DC
  Administered 2020-05-25: 20 meq via ORAL
  Filled 2020-05-25 (×2): qty 1

## 2020-05-25 NOTE — Progress Notes (Signed)
  Speech Language Pathology Treatment: Dysphagia  Patient Details Name: Eric Stokes MRN: [id number] DOB: [birth year] Today's Date: 05/25/2020 Time: 5366-4403 SLP Time Calculation (min) (ACUTE ONLY): 12 min  Assessment / Plan / Recommendation Clinical Impression  F/u after yesterday's initial assessment.  Diet has been upgraded from clears to regular.  Today, pt willing to consume 4 oz chopped peaches, drank successive quantities of thin liquids.  There was adequate oral attention, swift swallow trigger, and no s/s of aspiration. Voice clear quality phonation.  Respiratory/swallowing sequencing intact.  No dysphagia. No further SLP f/u; discussed with pt/wife.  Will sign off.   HPI HPI: Pt is a 74 y/o male with h/o COPD, sinus brady, PVC's, HTN, OSA, prostate CA, recent R TKA admitted with CP and SOB, found to be in afib with RVR.  Workup for elevated trop, CP and CHF.  Pt s/p CABGx4 10/1. ETT 10/1-10/5.      SLP Plan  All goals met       Recommendations  Diet recommendations: Regular;Thin liquid Liquids provided via: Cup;Straw Medication Administration: Whole meds with liquid Supervision: Patient able to self feed                Oral Care Recommendations: Oral care BID SLP Visit Diagnosis: Dysphagia, unspecified (R13.10) Plan: All goals met       GO              Eric Stokes L. Tivis Ringer, [place] CCC/SLP Acute Rehabilitation Services Office number [phone] Pager [phone]   Juan Quam Eric Stokes 05/25/2020, 9:59 AM

## 2020-05-25 NOTE — Progress Notes (Signed)
7 Days Post-Op Procedure(s) (LRB): CORONARY ARTERY BYPASS GRAFTING (CABG) TIMES FOUR USING INTERNAL BILATERAL MAMMARY ARTERIES AND HARVESTED LEFT RADIAL ARTERY. (N/A) MAZE USING ATRICURE ISOLATOR CLAMP OLL2 (N/A) RADIAL ARTERY HARVEST (Left) TRANSESOPHAGEAL ECHOCARDIOGRAM (TEE) (N/A) INDOCYANINE GREEN FLUORESCENCE IMAGING (ICG) (N/A) CLIPPING OF ATRIAL APPENDAGE USING ATRICURE 45 MM ATRICLIP (Left) Subjective: Feeling much better  Objective: Vital signs in last 24 hours: Temp:  [97.9 F (36.6 C)-99.7 F (37.6 C)] 98.6 F (37 C) (10/08 1100) Pulse Rate:  [63-94] 82 (10/08 1400) Cardiac Rhythm: Normal sinus rhythm (10/08 0800) Resp:  [11-28] 19 (10/08 1400) BP: (110-176)/(38-124) 130/87 (10/08 1400) SpO2:  [92 %-98 %] 93 % (10/08 1400) Weight:  [115.7 kg] 115.7 kg (10/08 0500)  Hemodynamic parameters for last 24 hours: CVP:  [15 mmHg-20 mmHg] 20 mmHg  Intake/Output from previous day: 10/07 0701 - 10/08 0700 In: 1527.6 [P.O.:840; I.V.:624; IV Piggyback:63.6] Out: 1355 [Urine:1355] Intake/Output this shift: Total I/O In: 694.8 [P.O.:480; I.V.:214.8] Out: 325 [Urine:325]  General appearance: alert and cooperative Neurologic: intact Heart: regular rate and rhythm, S1, S2 normal, no murmur, click, rub or gallop Lungs: diminished breath sounds bibasilar Abdomen: soft, non-tender; bowel sounds normal; no masses,  no organomegaly Extremities: edema 2+ Wound: dressed, dry  Lab Results: Recent Labs    05/24/20 0347 05/25/20 0338  WBC 4.9 5.7  HGB 10.9* 10.6*  HCT 33.2* 33.0*  PLT 138* 178   BMET:  Recent Labs    05/24/20 0347 05/25/20 0338  NA 134* 133*  K 3.4* 3.4*  CL 92* 91*  CO2 32 32  GLUCOSE 133* 121*  BUN 17 16  CREATININE 0.95 0.86  CALCIUM 7.8* 8.0*    PT/INR: No results for input(s): LABPROT, INR in the last 72 hours. ABG    Component Value Date/Time   PHART 7.513 (H) 05/22/2020 0853   HCO3 32.1 (H) 05/22/2020 0853   TCO2 33 (H) 05/22/2020 0853    ACIDBASEDEF 1.0 05/19/2020 2131   O2SAT 61.2 05/25/2020 0338   CBG (last 3)  Recent Labs    05/25/20 0333 05/25/20 0624 05/25/20 1124  GLUCAP 114* 130* 121*    Assessment/Plan: S/P Procedure(s) (LRB): CORONARY ARTERY BYPASS GRAFTING (CABG) TIMES FOUR USING INTERNAL BILATERAL MAMMARY ARTERIES AND HARVESTED LEFT RADIAL ARTERY. (N/A) MAZE USING ATRICURE ISOLATOR CLAMP OLL2 (N/A) RADIAL ARTERY HARVEST (Left) TRANSESOPHAGEAL ECHOCARDIOGRAM (TEE) (N/A) INDOCYANINE GREEN FLUORESCENCE IMAGING (ICG) (N/A) CLIPPING OF ATRIAL APPENDAGE USING ATRICURE 45 MM ATRICLIP (Left) Mobilize Diuresis chest PA and lateral cxr, as the portable films suggests R>L bilateral effusions, but difficult to fully assess   LOS: 9 days    Wonda Olds 05/25/2020

## 2020-05-25 NOTE — Significant Event (Signed)
Rapid Response Event Note   Reason for Call :  Asked to see pt during rounds d/t abnormal heart sound.  Initial Focused Assessment:  Pt laying in bed, alert and oriented. Pt c/o N/V and some mild mid sternal chest pressure 3/10, and a sound he is hearing from his heart. He denies SOB.  Lungs clear with some mild wheezing. Abdomen soft, obese, non-tender. Skin warm and dry. A pericardial friction rub is present. T-98.4, HR-71, BP--138/78, RR-20, SpO2-93% on RA.   Interventions:  No RRT interventions needed at this time.  Plan of Care:  Pericardial friction rub is documented in multiple MD notes. Pt appears comfortable aside from some N/V and mild chest pressure. His vital signs are stable. Continue to monitor pt. Please call RRT if pt declines or if further assistance needed.    Event Summary:   MD Notified:  Call [phone] Arrival Time:2230 End HFGB:0211  Dillard Essex, RN

## 2020-05-25 NOTE — Progress Notes (Signed)
Physical Therapy Treatment Patient Details Name: Eric Stokes MRN: [id number] DOB: 1946/05/19 Today's Date: 05/25/2020    History of Present Illness pt is a 74 y/o mal with h/o sinus brady, PVC's, HTN OSA, prostate CA, recent R TKA admitted with CP and SOB, found to be in afib with RVR.  Workup for elevated trop, CP and CHF.  Pt s/p CABGx4 10/1.    PT Comments    Pt sitting in chair on arrival after having walked early am. Pt with improved transfers and gait this session with decreased assist needed for standing. Pt with limited ability to rise given sternal precautions and lack of right knee flexion from recent TKA. Pt with mod assist to scoot to edge of chair and rise. Pt educated for precautions and safety. Will continue to follow.    HR 94-97 SpO2 92-97% on RA 155/81 (98) pre gait   Follow Up Recommendations  Home health PT;Supervision/Assistance - 24 hour     Equipment Recommendations  None recommended by PT    Recommendations for Other Services       Precautions / Restrictions Precautions Precautions: Sternal;Fall Precaution Booklet Issued: No Precaution Comments: pt able to recall not to use arms and educated for all precautions    Mobility  Bed Mobility               General bed mobility comments: pt in recliner on arrival and at toilet end of session  Transfers Overall transfer level: Needs assistance   Transfers: Sit to/from Stand Sit to Stand: Mod assist;Min assist         General transfer comment: initial stand from chair with mod assist with left knee blocked, cues for sequence with assist to rise. 2nd trial with min +2 assist cues for hand placement  Ambulation/Gait   Gait Distance (Feet): 70 Feet Assistive device: Rolling walker (2 wheeled) Gait Pattern/deviations: Step-through pattern;Decreased stride length;Trunk flexed   Gait velocity interpretation: 1.31 - 2.62 ft/sec, indicative of limited community ambulator General Gait  Details: cues for posture, safety and sequence. After 40' pt reported fatigue and need for BM with seated rest in chair for return to room. additional 8' from recliner to toilet in room   Stairs             Wheelchair Mobility    Modified Rankin (Stroke Patients Only)       Balance Overall balance assessment: Needs assistance   Sitting balance-Leahy Scale: Fair       Standing balance-Leahy Scale: Poor Standing balance comment: reliant on RW                            Cognition Arousal/Alertness: Awake/alert Behavior During Therapy: WFL for tasks assessed/performed Overall Cognitive Status: Within Functional Limits for tasks assessed                                        Exercises      General Comments        Pertinent Vitals/Pain Pain Score: 4  Pain Location: chest Pain Descriptors / Indicators: Aching;Sore Pain Intervention(s): Limited activity within patient's tolerance;Monitored during session;Repositioned    Home Living                      Prior Function  PT Goals (current goals can now be found in the care plan section) Progress towards PT goals: Progressing toward goals    Frequency    Min 3X/week      PT Plan Current plan remains appropriate    Co-evaluation              AM-PAC PT "6 Clicks" Mobility   Outcome Measure  Help needed turning from your back to your side while in a flat bed without using bedrails?: A Lot Help needed moving from lying on your back to sitting on the side of a flat bed without using bedrails?: A Lot Help needed moving to and from a bed to a chair (including a wheelchair)?: A Lot Help needed standing up from a chair using your arms (e.g., wheelchair or bedside chair)?: A Lot Help needed to walk in hospital room?: A Little Help needed climbing 3-5 steps with a railing? : A Lot 6 Click Score: 13    End of Session Equipment Utilized During Treatment:  Gait belt Activity Tolerance: Patient tolerated treatment well Patient left: with call bell/phone within reach;Other (comment) (on commode with RN at room window and aware) Nurse Communication: Mobility status;Precautions PT Visit Diagnosis: Other abnormalities of gait and mobility (R26.89);Pain;Muscle weakness (generalized) (M62.81)     Time: 2035-5974 PT Time Calculation (min) (ACUTE ONLY): 23 min  Charges:  $Gait Training: 8-22 mins $Therapeutic Activity: 8-22 mins                     Jasleen Riepe P, PT Acute Rehabilitation Services Pager: [phone] Office: [place] 05/25/2020, 1:15 PM

## 2020-05-25 NOTE — Plan of Care (Signed)
  Problem: Education: Goal: Knowledge of General Education information will improve Description: Including pain rating scale, medication(s)/side effects and non-pharmacologic comfort measures Outcome: Progressing   Problem: Health Behavior/Discharge Planning: Goal: Ability to manage health-related needs will improve Outcome: Progressing   Problem: Clinical Measurements: Goal: Ability to maintain clinical measurements within normal limits will improve Outcome: Progressing Goal: Will remain free from infection Outcome: Progressing Goal: Diagnostic test results will improve Outcome: Progressing Goal: Respiratory complications will improve Outcome: Progressing Goal: Cardiovascular complication will be avoided Outcome: Progressing   Problem: Activity: Goal: Risk for activity intolerance will decrease Outcome: Progressing   Problem: Nutrition: Goal: Adequate nutrition will be maintained Outcome: Progressing   Problem: Coping: Goal: Level of anxiety will decrease Outcome: Progressing   Problem: Elimination: Goal: Will not experience complications related to bowel motility Outcome: Progressing Goal: Will not experience complications related to urinary retention Outcome: Progressing   Problem: Pain Managment: Goal: General experience of comfort will improve Outcome: Progressing   Problem: Safety: Goal: Ability to remain free from injury will improve Outcome: Progressing   Problem: Skin Integrity: Goal: Risk for impaired skin integrity will decrease Outcome: Progressing   Problem: Education: Goal: Will demonstrate proper wound care and an understanding of methods to prevent future damage Outcome: Progressing Goal: Knowledge of disease or condition will improve Outcome: Progressing Goal: Knowledge of the prescribed therapeutic regimen will improve Outcome: Progressing Goal: Individualized Educational Video(s) Outcome: Progressing   Problem: Activity: Goal: Risk for  activity intolerance will decrease Outcome: Progressing   Problem: Cardiac: Goal: Will achieve and/or maintain hemodynamic stability Outcome: Progressing   Problem: Clinical Measurements: Goal: Postoperative complications will be avoided or minimized Outcome: Progressing   Problem: Respiratory: Goal: Respiratory status will improve Outcome: Progressing   Problem: Skin Integrity: Goal: Wound healing without signs and symptoms of infection Outcome: Progressing Goal: Risk for impaired skin integrity will decrease Outcome: Progressing   Problem: Urinary Elimination: Goal: Ability to achieve and maintain adequate renal perfusion and functioning will improve Outcome: Progressing   Problem: Activity: Goal: Ability to tolerate increased activity will improve Outcome: Progressing   Problem: Respiratory: Goal: Ability to maintain a clear airway and adequate ventilation will improve Outcome: Progressing   Problem: Role Relationship: Goal: Method of communication will improve Outcome: Progressing   

## 2020-05-25 NOTE — Progress Notes (Addendum)
Pt complaining of noise coming from chest and wants it to be assessed. This RN as well as the charge nurse assessed pt and heard what has previously been charted as a pericardial friction rub by multiple RNs as well as MDs. Pt notified that this sound is not new and his doctors are aware. Rapid response to come and assess pt during rounds to give pt more reassurance. Will continue to monitor.

## 2020-05-25 NOTE — Progress Notes (Addendum)
Patient ID: Eric Stokes, male   DOB: 1946-04-19, 74 y.o.   MRN: [id number] P    Advanced Heart Failure Rounding Note  PCP-Cardiologist: No primary care provider on file.   Subjective:    Taken to OR 10/1 for CABG + MAZE and LAA clip.   Remains on milrinone 0.125 mcg + lasix drip 6 mg per hour. Co-ox 61%.    -1.4L in UOP yesterday. Wt up 4 lb. Remains fluid overloaded.   K 3.4  Sitting up in bed. No complaints. Had BM yesterday after sorbitol.   Objective:   Weight Range: 115.7 kg Body mass index is 36.6 kg/m.   Vital Signs:   Temp:  [97.9 F (36.6 C)-99.7 F (37.6 C)] 97.9 F (36.6 C) (10/08 0745) Pulse Rate:  [63-94] 84 (10/08 0800) Resp:  [14-28] 17 (10/08 0800) BP: (109-155)/(43-83) 155/81 (10/08 0800) SpO2:  [92 %-99 %] 97 % (10/08 0800) Weight:  [115.7 kg] 115.7 kg (10/08 0500) Last BM Date: 05/24/20  Weight change: Filed Weights   05/23/20 0500 05/24/20 0500 05/25/20 0500  Weight: 114.9 kg 109.5 kg 115.7 kg    Intake/Output:   Intake/Output Summary (Last 24 hours) at 05/25/2020 0920 Last data filed at 05/25/2020 0800 Gross per 24 hour  Intake 1462.75 ml  Output 1260 ml  Net 202.75 ml      Physical Exam   General:  Well appearing, sitting up in bed. No resp difficulty HEENT: normal Neck: supple. Thick neck, JVP difficult to assess. swan transducer removed and bandaged. Carotids 2+ bilat; no bruits. No lymphadenopathy or thryomegaly appreciated. RIJ  Cor: PMI nondisplaced. Regular rate & rhythm. + friction rub LLSB Sternal site ok  Lungs: decreased BS bilaterally  Abdomen: obese, soft, nontender, nondistended. No hepatosplenomegaly. No bruits or masses. + Bowel sounds  Extremities: no cyanosis, clubbing, rash, 1+ bilateral pretibial edema.   Neuro: alert & orientedx3, cranial nerves grossly intact. moves all 4 extremities w/o difficulty. Affect flat    Telemetry   SR 90s   Labs    CBC Recent Labs    05/24/20 0347 05/25/20 0338  WBC  4.9 5.7  HGB 10.9* 10.6*  HCT 33.2* 33.0*  MCV 86.2 87.5  PLT 138* 242   Basic Metabolic Panel Recent Labs    05/24/20 0347 05/25/20 0338  NA 134* 133*  K 3.4* 3.4*  CL 92* 91*  CO2 32 32  GLUCOSE 133* 121*  BUN 17 16  CREATININE 0.95 0.86  CALCIUM 7.8* 8.0*   Liver Function Tests No results for input(s): AST, ALT, ALKPHOS, BILITOT, PROT, ALBUMIN in the last 72 hours. No results for input(s): LIPASE, AMYLASE in the last 72 hours. Cardiac Enzymes No results for input(s): CKTOTAL, CKMB, CKMBINDEX, TROPONINI in the last 72 hours.  BNP: BNP (last 3 results) Recent Labs    05/16/20 1155  BNP 415.7*    ProBNP (last 3 results) No results for input(s): PROBNP in the last 8760 hours.   D-Dimer No results for input(s): DDIMER in the last 72 hours. Hemoglobin A1C No results for input(s): HGBA1C in the last 72 hours. Fasting Lipid Panel No results for input(s): CHOL, HDL, LDLCALC, TRIG, CHOLHDL, LDLDIRECT in the last 72 hours. Thyroid Function Tests No results for input(s): TSH, T4TOTAL, T3FREE, THYROIDAB in the last 72 hours.  Invalid input(s): FREET3  Other results:   Imaging    DG Chest 1 View  Result Date: 05/25/2020 CLINICAL DATA:  Shortness of breath EXAM: CHEST  1 VIEW COMPARISON:  May 24, 2020 FINDINGS: Right jugular catheter tip is in the superior vena cava. There is a peripherally inserted central catheter from the right side with tip at cavoatrial junction. No pneumothorax. There are pleural effusions bilaterally with ill-defined airspace opacity in each mid and lower lung region. There is cardiomegaly with pulmonary vascularity normal. No adenopathy. Patient is status post coronary artery bypass grafting. There is a left atrial appendage clamp. No bone lesions. IMPRESSION: Catheter positions as described without pneumothorax. Small pleural effusions bilaterally. Ill-defined opacity in each mid and lower lung zone, likely primarily due to atelectatic change,  stable. Stable cardiomegaly. Postoperative changes noted. Electronically Signed   By: Lowella Grip III M.D.   On: 05/25/2020 07:56   [place] EKG SITE RITE  Result Date: 05/24/2020 If Site Rite image not attached, placement could not be confirmed due to current cardiac rhythm.    Medications:     Scheduled Medications: . apixaban  5 mg Oral BID  . aspirin EC  81 mg Oral Daily  . bisacodyl  10 mg Oral Daily   Or  . bisacodyl  10 mg Rectal Daily  . bisacodyl  10 mg Rectal Once  . Chlorhexidine Gluconate Cloth  6 each Topical Daily  . colchicine  0.3 mg Oral BID  . docusate sodium  200 mg Oral Daily  . feeding supplement  1 Container Oral TID BM  . influenza vaccine adjuvanted  0.5 mL Intramuscular Tomorrow-1000  . insulin aspart  0-15 Units Subcutaneous Q4H  . latanoprost  1 drop Both Eyes QHS  . losartan  12.5 mg Oral Daily  . mouth rinse  15 mL Mouth Rinse BID  . pantoprazole  40 mg Oral Daily  . potassium chloride  20 mEq Oral Q4H  . QUEtiapine  25 mg Oral QHS  . rosuvastatin  20 mg Oral Daily  . sodium chloride flush  10-40 mL Intracatheter Q12H  . sodium chloride flush  3 mL Intravenous Q12H  . spironolactone  12.5 mg Oral Daily    Infusions: . sodium chloride    . amiodarone 30 mg/hr (05/25/20 0800)  . furosemide (LASIX) infusion 6 mg/hr (05/25/20 0800)  . milrinone 0.125 mcg/kg/min (05/25/20 0800)    PRN Medications: levalbuterol, metoprolol tartrate, morphine injection, ondansetron (ZOFRAN) IV, oxyCODONE, sodium chloride flush, sodium chloride flush, sodium chloride flush, sorbitol, traMADol    Assessment/Plan   1. CAD/NSTEMI with critcal LM disease - HS Trop [phone]. LHC with severe multivessel disease.  - S/p CABG 10/1 - ASA/Crestor 20 (myalgias with atorvastatin).   2. Acute Systolic Heart Failure>>Cardiogenic Shock  - ECHO in 2020 showed EF 60-65%.  - ECHO this admit showed EF is down to 25-30%. RV normal.  Repeat echo post-op on 10/2 showed  EF 25%, normal RV, no pericardial effusion.  - Ischemic CMP. Cath this admit w/ severe MVCAD w/ critical LM disease, s/p CABG as above.  - IABP out. Off epi and Norepi.  - Remains on milrinone 0.125 mcg. Co-ox marginal 61%. Continue at current rate today, try to wean off tomorrow.  - remains fluid overloaded. Continue lasix gtt at 6 ml/hr - Increase Arlyce Harman to 25 daily  - Stop Losartan - start Entresto 24-26 bid - follow CVPs  - Renal function stable.  - ambulate today   3. A fib RVR  - s/p MAZE w/ LAA Clip  - maintaining NSR.  - continue on amio gtt at 30 mg/hr while on milrinone  - continue Eliquis  5 bid  4. H/o Prostate Cancer - coudet catheter in place due to massive prostate enlargement   - continue w/ coudet cath until fully diuresed   5. Hypokalemia - K 3.4 - give supp kcl - increase spiro to 25 - start Entresto   Transfer to SDU today. Ambulate w/ CR. Encourage to continue IS    Length of Stay: [place], PA-C  9:20 AM   Patient seen and examined with the above-signed Advanced Practice Provider and/or Housestaff. I personally reviewed laboratory data, imaging studies and relevant notes. I independently examined the patient and formulated the important aspects of the plan. I have edited the note to reflect any of my changes or salient points. I have personally discussed the plan with the patient and/or family.  Continues with slow improvement. Hemodynamically stable off pressors. Stil mildly volume overloaded. Able to walk halls with walker (slowly). Remains in NSR  General:  Elderly obese male No resp difficulty HEENT: normal Neck: supple. JVP 7-8 Carotids 2+ bilat; no bruits. No lymphadenopathy or thryomegaly appreciated. Cor: Sternal wound ok  PMI nondisplaced. Regular rate & rhythm. No rubs, gallops or murmurs. Lungs: decreased throughout  Abdomen: obese soft, nontender, nondistended. No hepatosplenomegaly. No bruits or masses. Good bowel  sounds. Extremities: no cyanosis, clubbing, rash, 1-2+ edema Neuro: alert & orientedx3, cranial nerves grossly intact. moves all 4 extremities w/o difficulty. Affect pleasant  Continues to progress slowly. I watched him walk the halls today and it is clear that he is very debilitated at baseline. Will need aggressive PT. Switch losartan to Entresto. Increase spiro. Continue lasix gtt one more day. Give one dose metolazone. Encourage IS.   To 2C/4E when bed available.   Glori Bickers, MD  3:45 PM

## 2020-05-25 NOTE — Progress Notes (Signed)
Brief Nutrition Note  Spoke with pt and wife at bedside. RN at bedside providing nursing care.  Cortrak ordered for Wednesday but pt refused. RD followed up with pt to see if he would consent to Cortrak today. Pt does NOT want a Cortrak placed at this time. He feels like he is doing better and nausea is improved. Diet was advanced from clear liquids to Heart Healthy this morning. Pt believes he will be able to eat solid foods better than the clear liquid meal tray items.  Pt does not like Boost Breeze supplements. RD will d/c these and order Ensure Enlive to aid pt in meeting kcal and protein needs.  Cortrak order has been discontinued. Please re-consult if Cortrak is needed.   Gaynell Face, MS, RD, LDN Inpatient Clinical Dietitian Please see AMiON for contact information.

## 2020-05-26 ENCOUNTER — Inpatient Hospital Stay (HOSPITAL_COMMUNITY): Payer: No Typology Code available for payment source

## 2020-05-26 DIAGNOSIS — Z951 Presence of aortocoronary bypass graft: Secondary | ICD-10-CM | POA: Diagnosis not present

## 2020-05-26 DIAGNOSIS — I5021 Acute systolic (congestive) heart failure: Secondary | ICD-10-CM | POA: Diagnosis not present

## 2020-05-26 DIAGNOSIS — I214 Non-ST elevation (NSTEMI) myocardial infarction: Secondary | ICD-10-CM | POA: Diagnosis not present

## 2020-05-26 DIAGNOSIS — I4891 Unspecified atrial fibrillation: Secondary | ICD-10-CM | POA: Diagnosis not present

## 2020-05-26 LAB — BASIC METABOLIC PANEL
Anion gap: 12 (ref 5–15)
BUN: 16 mg/dL (ref 8–23)
CO2: 31 mmol/L (ref 22–32)
Calcium: 8.2 mg/dL — ABNORMAL LOW (ref 8.9–10.3)
Chloride: 87 mmol/L — ABNORMAL LOW (ref 98–111)
Creatinine, Ser: 0.99 mg/dL (ref 0.61–1.24)
GFR, Estimated: >60 mL/min (ref >60)
Glucose, Bld: 134 mg/dL — ABNORMAL HIGH (ref 70–99)
Potassium: 3.3 mmol/L — ABNORMAL LOW (ref 3.5–5.1)
Sodium: 130 mmol/L — ABNORMAL LOW (ref 135–145)

## 2020-05-26 LAB — CBC
HCT: 37.1 % — ABNORMAL LOW (ref 39.0–52.0)
Hemoglobin: 12.2 g/dL — ABNORMAL LOW (ref 13.0–17.0)
MCH: 28 pg (ref 26.0–34.0)
MCHC: 32.9 g/dL (ref 30.0–36.0)
MCV: 85.3 fL (ref 80.0–100.0)
Platelets: 278 10*3/uL (ref 150–400)
RBC: 4.35 MIL/uL (ref 4.22–5.81)
RDW: 13.9 % (ref 11.5–15.5)
WBC: 9.8 10*3/uL (ref 4.0–10.5)
nRBC: 0 % (ref 0.0–0.2)

## 2020-05-26 LAB — GLUCOSE, CAPILLARY
Glucose-Capillary: 138 mg/dL — ABNORMAL HIGH (ref 70–99)
Glucose-Capillary: 126 mg/dL — ABNORMAL HIGH (ref 70–99)
Glucose-Capillary: 132 mg/dL — ABNORMAL HIGH (ref 70–99)
Glucose-Capillary: 118 mg/dL — ABNORMAL HIGH (ref 70–99)
Glucose-Capillary: 111 mg/dL — ABNORMAL HIGH (ref 70–99)

## 2020-05-26 LAB — COOXEMETRY PANEL
Carboxyhemoglobin: 1.3 % (ref 0.5–1.5)
Carboxyhemoglobin: 1.4 % (ref 0.5–1.5)
Methemoglobin: 0.8 % (ref 0.0–1.5)
Methemoglobin: 0.9 % (ref 0.0–1.5)
O2 Saturation: 53 %
O2 Saturation: 59.6 %
Total hemoglobin: 12.5 g/dL (ref 12.0–16.0)
Total hemoglobin: 11.3 g/dL — ABNORMAL LOW (ref 12.0–16.0)

## 2020-05-26 LAB — MAGNESIUM: Magnesium: 1.9 mg/dL (ref 1.7–2.4)

## 2020-05-26 MED ORDER — TRAZODONE HCL 50 MG PO TABS
50.0000 mg | ORAL_TABLET | Freq: Once | ORAL | Status: AC
  Administered 2020-05-26: 50 mg via ORAL
  Filled 2020-05-26: qty 1

## 2020-05-26 MED ORDER — METOCLOPRAMIDE HCL 5 MG/ML IJ SOLN
10.0000 mg | Freq: Three times a day (TID) | INTRAMUSCULAR | Status: AC
  Administered 2020-05-26 – 2020-05-27 (×6): 10 mg via INTRAVENOUS
  Filled 2020-05-26 (×6): qty 2

## 2020-05-26 MED ORDER — LIDOCAINE HCL 1 % IJ SOLN
INTRAMUSCULAR | Status: AC
  Filled 2020-05-26: qty 20

## 2020-05-26 MED ORDER — MAGNESIUM SULFATE 2 GM/50ML IV SOLN
2.0000 g | Freq: Once | INTRAVENOUS | Status: AC
  Administered 2020-05-26: 2 g via INTRAVENOUS
  Filled 2020-05-26: qty 50

## 2020-05-26 MED ORDER — TRAMADOL HCL 50 MG PO TABS: 50.0000 mg | ORAL_TABLET | ORAL | Status: DC | PRN

## 2020-05-26 MED ORDER — OXYCODONE HCL 5 MG PO TABS: 5.0000 mg | ORAL_TABLET | ORAL | Status: DC | PRN

## 2020-05-26 MED ORDER — POTASSIUM CHLORIDE CRYS ER 20 MEQ PO TBCR
40.0000 meq | EXTENDED_RELEASE_TABLET | Freq: Once | ORAL | Status: DC
  Filled 2020-05-26: qty 2

## 2020-05-26 MED ORDER — POTASSIUM CHLORIDE CRYS ER 20 MEQ PO TBCR
40.0000 meq | EXTENDED_RELEASE_TABLET | Freq: Three times a day (TID) | ORAL | Status: DC
  Administered 2020-05-26 – 2020-05-27 (×6): 40 meq via ORAL
  Filled 2020-05-26 (×6): qty 2

## 2020-05-26 MED ORDER — DIGOXIN 125 MCG PO TABS
0.1250 mg | ORAL_TABLET | Freq: Every day | ORAL | Status: DC
  Administered 2020-05-26 – 2020-05-27 (×2): 0.125 mg via ORAL
  Filled 2020-05-26 (×2): qty 1

## 2020-05-26 MED ORDER — TRAMADOL HCL 50 MG PO TABS: 100.0000 mg | ORAL_TABLET | Freq: Four times a day (QID) | ORAL | Status: DC | PRN

## 2020-05-26 MED ORDER — METOCLOPRAMIDE HCL 5 MG/ML IJ SOLN: 5.0000 mg | Freq: Three times a day (TID) | INTRAMUSCULAR | Status: DC

## 2020-05-26 MED ORDER — COLCHICINE 0.6 MG PO TABS
0.6000 mg | ORAL_TABLET | Freq: Two times a day (BID) | ORAL | Status: DC
  Administered 2020-05-26 – 2020-05-30 (×9): 0.6 mg via ORAL
  Filled 2020-05-26 (×9): qty 1

## 2020-05-26 MED ORDER — MIDAZOLAM HCL 2 MG/2ML IJ SOLN
INTRAMUSCULAR | Status: AC
  Filled 2020-05-26: qty 6

## 2020-05-26 MED ORDER — FENTANYL CITRATE (PF) 100 MCG/2ML IJ SOLN
INTRAMUSCULAR | Status: AC
  Filled 2020-05-26: qty 4

## 2020-05-26 MED ORDER — METOLAZONE 2.5 MG PO TABS
5.0000 mg | ORAL_TABLET | Freq: Once | ORAL | Status: AC
  Administered 2020-05-26: 5 mg via ORAL
  Filled 2020-05-26: qty 2

## 2020-05-26 MED ORDER — POTASSIUM CHLORIDE CRYS ER 20 MEQ PO TBCR
20.0000 meq | EXTENDED_RELEASE_TABLET | ORAL | Status: DC
  Administered 2020-05-26: 20 meq via ORAL
  Filled 2020-05-26: qty 1

## 2020-05-26 NOTE — Progress Notes (Addendum)
      [address]       [place] [address]             [phone]      8 Days Post-Op Procedure(s) (LRB): CORONARY ARTERY BYPASS GRAFTING (CABG) TIMES FOUR USING INTERNAL BILATERAL MAMMARY ARTERIES AND HARVESTED LEFT RADIAL ARTERY. (N/A) MAZE USING ATRICURE ISOLATOR CLAMP OLL2 (N/A) RADIAL ARTERY HARVEST (Left) TRANSESOPHAGEAL ECHOCARDIOGRAM (TEE) (N/A) INDOCYANINE GREEN FLUORESCENCE IMAGING (ICG) (N/A) CLIPPING OF ATRIAL APPENDAGE USING ATRICURE 45 MM ATRICLIP (Left) Subjective: Alert and oriented. Complains of poor appetite today and also concerned about noise coming from his chest.   Passing gas, small liquid BM.   On milrinone, lasix drip, and amiodarone IV.   Objective: Vital signs in last 24 hours: Temp:  [97.7 F (36.5 C)-98.6 F (37 C)] 98.1 F (36.7 C) (10/09 0715) Pulse Rate:  [66-91] 91 (10/09 0940) Cardiac Rhythm: Normal sinus rhythm (10/09 0700) Resp:  [11-24] 20 (10/09 0715) BP: (117-176)/(50-125) 145/64 (10/09 0715) SpO2:  [89 %-95 %] 89 % (10/09 0355) Weight:  [114.3 kg] 114.3 kg (10/09 0600)  Hemodynamic parameters for last 24 hours: CVP:  [9 mmHg-21 mmHg] 9 mmHg  Intake/Output from previous day: 10/08 0701 - 10/09 0700 In: 1505.1 [P.O.:840; I.V.:665.1] Out: 2085 [Urine:2085] Intake/Output this shift: No intake/output data recorded.  General appearance: alert, cooperative and mild distress Neurologic: intact Heart: RRR, has loud multi-component pericardial rub.  Lungs: breath sounds clear, shallow.  Abdomen: soft, mild distension, NT, hyperactive bowel sounds. Extremities: warm and well perfused. Left arm incision well approximated with staples and is dry.  Wound: the sternotomy incision is intact and dry.   Lab Results: Recent Labs    05/25/20 0338 05/26/20 0355  WBC 5.7 9.8  HGB 10.6* 12.2*  HCT 33.0* 37.1*  PLT 178 278   BMET:  Recent Labs    05/25/20 0338 05/26/20 0355  NA 133* 130*  K 3.4* 3.3*  CL 91* 87*   CO2 32 31  GLUCOSE 121* 134*  BUN 16 16  CREATININE 0.86 0.99  CALCIUM 8.0* 8.2*    PT/INR: No results for input(s): LABPROT, INR in the last 72 hours. ABG    Component Value Date/Time   PHART 7.513 (H) 05/22/2020 0853   HCO3 32.1 (H) 05/22/2020 0853   TCO2 33 (H) 05/22/2020 0853   ACIDBASEDEF 1.0 05/19/2020 2131   O2SAT 53.0 05/26/2020 0355   CBG (last 3)  Recent Labs    05/25/20 2320 05/26/20 0338 05/26/20 0809  GLUCAP 110* 138* 126*    Assessment/Plan: S/P Procedure(s) (LRB): CORONARY ARTERY BYPASS GRAFTING (CABG) TIMES FOUR USING INTERNAL BILATERAL MAMMARY ARTERIES AND HARVESTED LEFT RADIAL ARTERY. (N/A) MAZE USING ATRICURE ISOLATOR CLAMP OLL2 (N/A) RADIAL ARTERY HARVEST (Left) TRANSESOPHAGEAL ECHOCARDIOGRAM (TEE) (N/A) INDOCYANINE GREEN FLUORESCENCE IMAGING (ICG) (N/A) CLIPPING OF ATRIAL APPENDAGE USING ATRICURE 45 MM ATRICLIP (Left)  -POD-8 CABG x 4, MAZE, LAA clip. Acute systolic HF, mgt per HF team.   -Pericardial rub- more prominent than most, agree with checking echo.   -H/O atrial fibrillation- remains in SR, on amiodarone.   -hypokalemia- supplement ordered.  -Poor appetiite- abd exam benign, adding Reglan today. Encourage activity.   -Pleural effusion- IR consult for drainage discussed.    LOS: 10 days    Antony Odea, [place] [phone] 05/26/2020 Pt seen and examined; agree with documentation. Plan right pigtail. Ileigh Mettler Z. Orvan Seen, [place]

## 2020-05-26 NOTE — CV Procedure (Signed)
Echocardiogram not completed, [name] will be going to CT shortly. Echo will be re-attempted at a later time.   Darlina Sicilian RDCS

## 2020-05-26 NOTE — Sedation Documentation (Signed)
SBAR called to [place], Therapist, sports.

## 2020-05-26 NOTE — Sedation Documentation (Addendum)
No window to safely place chest tube per Dr. Vernard Gambles. Pt taken back to 2C05.

## 2020-05-26 NOTE — Progress Notes (Addendum)
Patient ID: Eric Stokes, male   DOB: 1945/11/20, 74 y.o.   MRN: [id number] P    Advanced Heart Failure Rounding Note  PCP-Cardiologist: No primary care provider on file.   Subjective:    Taken to OR 10/1 for CABG + MAZE and LAA clip.   Remains on milrinone 0.125 mcg + lasix drip 6 mg per hour. Co-ox 53%.  Weight down another 4 pounds.   Remains anxious. Says he had a terrible night. RN called RRT to assess abnormal chest sounds. Says he is unable to keep food down. Thinks it may be related to morphine. Chest still sore.   Objective:   Weight Range: 114.3 kg Body mass index is 36.16 kg/m.   Vital Signs:   Temp:  [97.7 F (36.5 C)-98.6 F (37 C)] 98.1 F (36.7 C) (10/09 0715) Pulse Rate:  [66-87] 66 (10/09 0715) Resp:  [11-24] 20 (10/09 0715) BP: (117-176)/(38-125) 145/64 (10/09 0715) SpO2:  [89 %-95 %] 89 % (10/09 0355) Weight:  [114.3 kg] 114.3 kg (10/09 0600) Last BM Date: 05/25/20  Weight change: Filed Weights   05/24/20 0500 05/25/20 0500 05/26/20 0600  Weight: 109.5 kg 115.7 kg 114.3 kg    Intake/Output:   Intake/Output Summary (Last 24 hours) at 05/26/2020 0811 Last data filed at 05/26/2020 0700 Gross per 24 hour  Intake 1332.39 ml  Output 2050 ml  Net -717.61 ml      Physical Exam   General:  Sitting up in chair No resp difficulty HEENT: normal Neck: supple. JVP 8-9 . Carotids 2+ bilat; no bruits. No lymphadenopathy or thryomegaly appreciated. Cor: PMI nondisplaced.  Sternal wound ok. Regular rate & rhythm. Loud friction rub  Lungs: deceased R>L Abdomen: obese soft, nontender, + distended. No hepatosplenomegaly. No bruits or masses. Good bowel sounds. Extremities: no cyanosis, clubbing, rash, 1+ edema Neuro: alert & orientedx3, cranial nerves grossly intact. moves all 4 extremities w/o difficulty. Affect pleasant   Telemetry   SR 60-70s Personally reviewed  Labs    CBC Recent Labs    05/25/20 0338 05/26/20 0355  WBC 5.7 9.8  HGB  10.6* 12.2*  HCT 33.0* 37.1*  MCV 87.5 85.3  PLT 178 387   Basic Metabolic Panel Recent Labs    05/25/20 0338 05/26/20 0355  NA 133* 130*  K 3.4* 3.3*  CL 91* 87*  CO2 32 31  GLUCOSE 121* 134*  BUN 16 16  CREATININE 0.86 0.99  CALCIUM 8.0* 8.2*  MG  --  1.9   Liver Function Tests No results for input(s): AST, ALT, ALKPHOS, BILITOT, PROT, ALBUMIN in the last 72 hours. No results for input(s): LIPASE, AMYLASE in the last 72 hours. Cardiac Enzymes No results for input(s): CKTOTAL, CKMB, CKMBINDEX, TROPONINI in the last 72 hours.  BNP: BNP (last 3 results) Recent Labs    05/16/20 1155  BNP 415.7*    ProBNP (last 3 results) No results for input(s): PROBNP in the last 8760 hours.   D-Dimer No results for input(s): DDIMER in the last 72 hours. Hemoglobin A1C No results for input(s): HGBA1C in the last 72 hours. Fasting Lipid Panel No results for input(s): CHOL, HDL, LDLCALC, TRIG, CHOLHDL, LDLDIRECT in the last 72 hours. Thyroid Function Tests No results for input(s): TSH, T4TOTAL, T3FREE, THYROIDAB in the last 72 hours.  Invalid input(s): FREET3  Other results:   Imaging    DG Chest 2 View  Result Date: 05/25/2020 CLINICAL DATA:  74 year old male with shortness of breath and chest pain. EXAM:  CHEST - 2 VIEW COMPARISON:  Chest radiograph dated 05/25/2020. FINDINGS: There is shallow inspiration with bibasilar atelectasis similar to prior radiograph. Pneumonia is not excluded clinical correlation is recommended. Trace pleural effusions may be present. No pneumothorax. Stable cardiomediastinal silhouette. Median sternotomy wires and cardiac surgical device. Stable positioning of the right-sided PICC. No acute osseous pathology. Dilated air-filled loops of small bowel in the visualized abdomen measure up to 4 cm may represent obstruction or ileus. Clinical correlation is recommended. IMPRESSION: Shallow inspiration with bibasilar atelectasis similar to prior radiograph.  Pneumonia is not excluded. Electronically Signed   By: Anner Crete M.D.   On: 05/25/2020 18:43     Medications:     Scheduled Medications: . apixaban  5 mg Oral BID  . aspirin EC  81 mg Oral Daily  . bisacodyl  10 mg Oral Daily   Or  . bisacodyl  10 mg Rectal Daily  . bisacodyl  10 mg Rectal Once  . Chlorhexidine Gluconate Cloth  6 each Topical Daily  . colchicine  0.3 mg Oral BID  . docusate sodium  200 mg Oral Daily  . feeding supplement (ENSURE ENLIVE)  237 mL Oral BID BM  . insulin aspart  0-15 Units Subcutaneous Q4H  . latanoprost  1 drop Both Eyes QHS  . mouth rinse  15 mL Mouth Rinse BID  . pantoprazole  40 mg Oral Daily  . potassium chloride  20 mEq Oral Q4H  . rosuvastatin  20 mg Oral Daily  . sacubitril-valsartan  1 tablet Oral BID  . sodium chloride flush  10-40 mL Intracatheter Q12H  . sodium chloride flush  3 mL Intravenous Q12H  . spironolactone  25 mg Oral Daily    Infusions: . sodium chloride    . amiodarone 30 mg/hr (05/26/20 0700)  . furosemide (LASIX) infusion 6 mg/hr (05/26/20 0700)  . milrinone 0.125 mcg/kg/min (05/26/20 0700)    PRN Medications: levalbuterol, metoprolol tartrate, morphine injection, ondansetron (ZOFRAN) IV, oxyCODONE, sodium chloride flush, sodium chloride flush, sodium chloride flush, sorbitol, traMADol    Assessment/Plan   1. CAD/NSTEMI with critcal LM disease - HS Trop [phone]. LHC with severe multivessel disease.  - S/p CABG 10/1 - ASA/Crestor 20 (myalgias with atorvastatin).  - no s/s ischemia - has apparent loud friction rub on exam. Will check echo. Will ask TCTS to evaluate. - restart colchcine  2. Acute Systolic Heart Failure>>Cardiogenic Shock  - ECHO in 2020 showed EF 60-65%.  - ECHO this admit showed EF is down to 25-30%. RV normal.  Repeat echo post-op on 10/2 showed EF 25%, normal RV, no pericardial effusion.  - Ischemic CMP. Cath this admit w/ severe MVCAD w/ critical LM disease, s/p CABG as  above.  - Remains on milrinone 0.125 mcg. Co-ox marginal 53%. Continue at current rate today, try to wean off tomorrow. Repeat co-ox - remains fluid overloaded. Continue lasix gtt at 6 ml/hr. Has 10 pounds to go. Will give metolazone x 1  - Continue Spiro to 25 daily  - Continue Entresto 24-26 bid - Add digoxin 0.125 daily - Renal function stable.    3. A fib RVR  - s/p MAZE w/ LAA Clip  - maintaining NSR.  - continue on amio gtt at 30 mg/hr while on milrinone  - continue Eliquis  5 bid  - no bleeding  4. H/o Prostate Cancer - coudet catheter in place due to massive prostate enlargement   - continue w/ coudet cath until fully diuresed   5.  Hypokalemia - K 3.3 - will supp   6. Post-op ileus - restart reglan - stop narcotics - ambulate  7. R pleural effusion - d/w Dr. Orvan Seen.  - will place IR consult for pigtail    Length of Stay: 10  Glori Bickers, MD  8:11 AM

## 2020-05-26 NOTE — Discharge Instructions (Signed)
Discharge Instructions:  1. You may shower, please wash incisions daily with soap and water and keep dry.  If you wish to cover wounds with dressing you may do so but please keep clean and change daily.  No tub baths or swimming until incisions have completely healed.  If your incisions become red or develop any drainage please call our office at [phone]  2. No Driving until cleared by Dr. Orvan Seen office and you are no longer using narcotic pain medications  3. Monitor your weight daily.. Please use the same scale and weigh at same time... If you gain 3-5 lbs in 48 hours with associated lower extremity swelling, please contact our office at [phone]  4. Fever of 101.5 for at least 24 hours with no source, please contact our office at [phone]  5. Activity- up as tolerated, please walk at least 3 times per day.  Avoid strenuous activity, no lifting, pushing, or pulling with your arms over 8-10 lbs for a minimum of 6 weeks  6. If any questions or concerns arise, please do not hesitate to contact our office at [phone]    Information on my medicine - ELIQUIS (apixaban)  Why was Eliquis prescribed for you? Eliquis was prescribed for you to reduce the risk of a blood clot forming that can cause a stroke if you have a medical condition called atrial fibrillation (a type of irregular heartbeat).  What do You need to know about Eliquis ? Take your Eliquis TWICE DAILY - one tablet in the morning and one tablet in the evening with or without food. If you have difficulty swallowing the tablet whole please discuss with your pharmacist how to take the medication safely.  Take Eliquis exactly as prescribed by your doctor and DO NOT stop taking Eliquis without talking to the doctor who prescribed the medication.  Stopping may increase your risk of developing a stroke.  Refill your prescription before you run out.  After discharge, you should have regular check-up appointments with  your healthcare provider that is prescribing your Eliquis.  In the future your dose may need to be changed if your kidney function or weight changes by a significant amount or as you get older.  What do you do if you miss a dose? If you miss a dose, take it as soon as you remember on the same day and resume taking twice daily.  Do not take more than one dose of ELIQUIS at the same time to make up a missed dose.  Important Safety Information A possible side effect of Eliquis is bleeding. You should call your healthcare provider right away if you experience any of the following: ? Bleeding from an injury or your nose that does not stop. ? Unusual colored urine (red or dark brown) or unusual colored stools (red or black). ? Unusual bruising for unknown reasons. ? A serious fall or if you hit your head (even if there is no bleeding).  Some medicines may interact with Eliquis and might increase your risk of bleeding or clotting while on Eliquis. To help avoid this, consult your healthcare provider or pharmacist prior to using any new prescription or non-prescription medications, including herbals, vitamins, non-steroidal anti-inflammatory drugs (NSAIDs) and supplements.  This website has more information on Eliquis (apixaban): http://www.eliquis.com/eliquis/home

## 2020-05-26 NOTE — Progress Notes (Signed)
CARDIAC REHAB PHASE I   PRE:  Rate/Rhythm: 96 afib  BP:  Supine: 145/64  Sitting:   Standing:    SaO2: 92% 2L  MODE:  Ambulation: 115 ft   POST:  Rate/Rhythm: 113 atrial fib,  95 NSR in bed  BP:  Supine:   Sitting: 129/95  Standing:    SaO2: 97%2L 0830-0910 Pt encouraged to walk. Pt walked 115 ft on 2L with gait belt use, EVA, asst x 2 and followed with recliner. Did not need to sit. Encouraged pt to rock to stand. Encouraged pt to stand upright and not put pressure on arms. Right knee sore and limits distance. Pt also c/o SOB with sats 97% on 2L. Pt in and out of atrial fib but NSR when resting in bed. Pt used IS for [place] and could demonstrate 1000 ml correctly. Encouraged use of IS and more walks. Pt still with some abdominal discomfort also. To bed with call bell. Pt still very weak.   Graylon Good, RN BSN  05/26/2020 9:06 AM

## 2020-05-26 NOTE — Plan of Care (Signed)
  Problem: Education: Goal: Knowledge of General Education information will improve Description: Including pain rating scale, medication(s)/side effects and non-pharmacologic comfort measures Outcome: Progressing   Problem: Health Behavior/Discharge Planning: Goal: Ability to manage health-related needs will improve Outcome: Progressing   Problem: Clinical Measurements: Goal: Ability to maintain clinical measurements within normal limits will improve Outcome: Progressing Goal: Will remain free from infection Outcome: Progressing Goal: Diagnostic test results will improve Outcome: Progressing Goal: Respiratory complications will improve Outcome: Progressing Goal: Cardiovascular complication will be avoided Outcome: Progressing   Problem: Activity: Goal: Risk for activity intolerance will decrease Outcome: Progressing   Problem: Nutrition: Goal: Adequate nutrition will be maintained Outcome: Progressing   Problem: Coping: Goal: Level of anxiety will decrease Outcome: Progressing   Problem: Elimination: Goal: Will not experience complications related to bowel motility Outcome: Progressing Goal: Will not experience complications related to urinary retention Outcome: Progressing   Problem: Pain Managment: Goal: General experience of comfort will improve Outcome: Progressing   Problem: Safety: Goal: Ability to remain free from injury will improve Outcome: Progressing   Problem: Skin Integrity: Goal: Risk for impaired skin integrity will decrease Outcome: Progressing   Problem: Education: Goal: Will demonstrate proper wound care and an understanding of methods to prevent future damage Outcome: Progressing Goal: Knowledge of disease or condition will improve Outcome: Progressing Goal: Knowledge of the prescribed therapeutic regimen will improve Outcome: Progressing Goal: Individualized Educational Video(s) Outcome: Progressing   Problem: Activity: Goal: Risk for  activity intolerance will decrease Outcome: Progressing   Problem: Cardiac: Goal: Will achieve and/or maintain hemodynamic stability Outcome: Progressing   Problem: Clinical Measurements: Goal: Postoperative complications will be avoided or minimized Outcome: Progressing   Problem: Respiratory: Goal: Respiratory status will improve Outcome: Progressing   Problem: Skin Integrity: Goal: Wound healing without signs and symptoms of infection Outcome: Progressing Goal: Risk for impaired skin integrity will decrease Outcome: Progressing   Problem: Urinary Elimination: Goal: Ability to achieve and maintain adequate renal perfusion and functioning will improve Outcome: Progressing   Problem: Activity: Goal: Ability to tolerate increased activity will improve Outcome: Progressing   Problem: Respiratory: Goal: Ability to maintain a clear airway and adequate ventilation will improve Outcome: Progressing   Problem: Role Relationship: Goal: Method of communication will improve Outcome: Progressing   

## 2020-05-26 NOTE — Consult Note (Signed)
Chief Complaint: Patient was seen in consultation today for  Chief Complaint  Patient presents with  . Shortness of Breath  . Chest Pain    Referring Physician(s): Dr. Haroldine Laws  Supervising Physician: Arne Cleveland  Patient Status: [facility] - In-pt  History of Present Illness: Eric Stokes is a 74 y.o. male with a medical history significant for HTN, BPH, prostate cancer and COPD. He presented to the ED 05/16/2020 with shortness of breath, palpitations and chest pain. He was found to be in atrial fibrillation with rapid ventricular rate and elevated troponins. He was taken to the cath lab and found to have multivessel disease requiring CABG. He was taken to the OR with Dr. Orvan Seen 05/18/2020 and underwent CABG + MAZE and LAA clip.   The patient is currently on milrinone, amiodarone and lasix infusions. Current imaging is suggestive of right > left pleural effusions.   Interventional Radiology has been asked to evaluate this patient for the placement of a right-sided chest tube. This case has been reviewed and procedure approved by Dr. Vernard Gambles.    Past Medical History:  Diagnosis Date  . Aortic stenosis, mild    noted on ECHO  . Asthma   . Benign essential hypertension, on Norvasc and Amlodipine 12/21/2008   Followed by [facility]   . BPH (benign prostatic hyperplasia) 09/03/2012  . Carotid bruit present 06/15/2018   2015 Carotid Dopplers:  Essentially normal carotid arteries, with very slight hard plaque in the proximal ICA's and serpentine distal vessels. 1-39% bilateral ICA stenosis. Patent vertebral arteries with antegrade flow. Normal subclavian arteries, bilaterally.  . Colonic polyp 01/09/2020  . COPD (chronic obstructive pulmonary disease) ([place])   . Dyslipidemia 12/21/2008   Qualifier: Diagnosis of  By: Percival Spanish, MD, Farrel Gordon    . Essential hypertension, benign 12/21/2008   Qualifier: Diagnosis of  By: Percival Spanish, MD, Farrel Gordon    . Former smoker, stopped smoking  in distant past 03/15/2018  . Hematuria 09/16/2011  . History of migraine   . OA (osteoarthritis)   . Obese   . OSA on CPAP 09/25/2015   Diagnosed at the [facility] 2016   . Prostate cancer ([place]) 11/30/2019  . PVC's (premature ventricular contractions)   . Seasonal allergies   . Sinus bradycardia   . Squamous cell carcinoma of skin   . TIA (transient ischemic attack)    questionable  . Vitamin B 12 deficiency   . Vitamin D deficiency 11/26/2010    Past Surgical History:  Procedure Laterality Date  . CHOLECYSTECTOMY  2001  . CLIPPING OF ATRIAL APPENDAGE Left 05/18/2020   Procedure: CLIPPING OF ATRIAL APPENDAGE USING ATRICURE 25 MM ATRICLIP;  Surgeon: Wonda Olds, MD;  Location: [place];  Service: Open Heart Surgery;  Laterality: Left;  . COLONOSCOPY    . CORONARY ARTERY BYPASS GRAFT N/A 05/18/2020   Procedure: CORONARY ARTERY BYPASS GRAFTING (CABG) TIMES FOUR USING INTERNAL BILATERAL MAMMARY ARTERIES AND HARVESTED LEFT RADIAL ARTERY.;  Surgeon: Wonda Olds, MD;  Location: [place];  Service: Open Heart Surgery;  Laterality: N/A;  CORONARY ENDARTERECTOMY PERFORMED DURING SURGERY   . IABP INSERTION N/A 05/17/2020   Procedure: IABP INSERTION;  Surgeon: Jolaine Artist, MD;  Location: [place] [facility];  Service: Cardiovascular;  Laterality: N/A;  . KNEE ARTHROPLASTY Right 03/14/2020   Procedure: COMPUTER ASSISTED TOTAL KNEE ARTHROPLASTY;  Surgeon: Rod Can, MD;  Location: [facility];  Service: Orthopedics;  Laterality: Right;  Marland Kitchen MAZE N/A 05/18/2020  Procedure: MAZE USING ATRICURE ISOLATOR CLAMP [place];  Surgeon: Wonda Olds, MD;  Location: [place];  Service: Open Heart Surgery;  Laterality: N/A;  . RADIAL ARTERY HARVEST Left 05/18/2020   Procedure: RADIAL ARTERY HARVEST;  Surgeon: Wonda Olds, MD;  Location: [place];  Service: Open Heart Surgery;  Laterality: Left;  . RIGHT HEART CATH N/A 05/17/2020   Procedure: RIGHT HEART CATH;  Surgeon: Jolaine Artist, MD;   Location: [place] [facility];  Service: Cardiovascular;  Laterality: N/A;  . RIGHT/LEFT HEART CATH AND CORONARY ANGIOGRAPHY N/A 05/17/2020   Procedure: RIGHT/LEFT HEART CATH AND CORONARY ANGIOGRAPHY;  Surgeon: Lorretta Harp, MD;  Location: [place] [facility];  Service: Cardiovascular;  Laterality: N/A;  . TEE WITHOUT CARDIOVERSION N/A 05/18/2020   Procedure: TRANSESOPHAGEAL ECHOCARDIOGRAM (TEE);  Surgeon: Wonda Olds, MD;  Location: [place];  Service: Open Heart Surgery;  Laterality: N/A;  . TONSILLECTOMY AND ADENOIDECTOMY  1952    Allergies: Tamsulosin, Cipro [ciprofloxacin hcl], Lipitor [atorvastatin], and Lisinopril  Medications: Prior to Admission medications   Medication Sig Start Date End Date Taking? Authorizing Provider  acetaminophen (TYLENOL) 500 MG tablet Take 1,000 mg by mouth at bedtime.   Yes [provider]  albuterol (VENTOLIN HFA) 108 (90 Base) MCG/ACT inhaler Inhale 2 puffs into the lungs every 6 (six) hours as needed for wheezing or shortness of breath. 05/04/20  Yes Leamon Arnt, MD  alfuzosin (UROXATRAL) 10 MG 24 hr tablet Take 5 mg by mouth in the morning and at bedtime.   Yes [provider]  carboxymethylcellulose (REFRESH PLUS) 0.5 % SOLN Place 1 drop into both eyes 2 (two) times daily as needed (dry eyes).   Yes [provider]  cetirizine (ZYRTEC) 10 MG tablet Take 10 mg by mouth daily. 01/16/13  Yes [provider]  Cholecalciferol (VITAMIN D3) 2000 UNITS TABS Take 2,000 Int'l Units by mouth daily.    Yes [provider]  finasteride (PROSCAR) 5 MG tablet Take 5 mg by mouth daily. 12/02/19  Yes [provider]  fluticasone (FLONASE) 50 MCG/ACT nasal spray USE TWO SPRAYS IN EACH NOSTRIL DAILY AS NEEDED. Patient taking differently: Place 2 sprays into both nostrils daily as needed for allergies.  05/02/20  Yes Leamon Arnt, MD  ibuprofen (ADVIL) 400 MG tablet Take 400 mg by mouth every 6 (six) hours as  needed for moderate pain.    Yes [provider]  latanoprost (XALATAN) 0.005 % ophthalmic solution INSTILL 1 DROP INTO BOTH EYES AT BEDTIME Patient taking differently: Place 1 drop into both eyes daily.  05/02/20  Yes Leamon Arnt, MD  losartan (COZAAR) 25 MG tablet TAKE 1 TABLET BY MOUTH EVERY DAY Patient taking differently: Take 25 mg by mouth daily.  03/01/20  Yes Leamon Arnt, MD  montelukast (SINGULAIR) 10 MG tablet TAKE 1 TABLET BY MOUTH EVERY DAY AT BEDTIME Patient taking differently: Take 10 mg by mouth at bedtime.  03/01/20  Yes Leamon Arnt, MD  NON FORMULARY Cpap at night   Yes [provider]  pravastatin (PRAVACHOL) 20 MG tablet Take 20 mg by mouth at bedtime.  03/05/20  Yes [provider]  apixaban (ELIQUIS) 2.5 MG TABS tablet Take 1 tablet (2.5 mg total) by mouth 2 (two) times daily. 03/15/20 04/14/20  Dorothyann Peng, PA     Family History  Problem Relation Age of Onset  . Diabetes Mother   . COPD Father   . Stroke Paternal  Grandfather     Social History   Socioeconomic History  . Marital status: Married    Spouse name: Not on file  . Number of children: Not on file  . Years of education: Not on file  . Highest education level: Not on file  Occupational History  . Occupation: retired  Tobacco Use  . Smoking status: Former Smoker    Types: Cigarettes    Quit date: 08/18/1965    Years since quitting: 54.8  . Smokeless tobacco: Never Used  . Tobacco comment: pt quit smoking 53 yrs ago  Vaping Use  . Vaping Use: Never used  Substance and Sexual Activity  . Alcohol use: Yes    Alcohol/week: 2.0 - 6.0 standard drinks    Types: 2 - 6 Cans of beer per week  . Drug use: No  . Sexual activity: Yes  Other Topics Concern  . Not on file  Social History Narrative   Is a [place] veteran, has trouble sleeping most nights. Often experiences nightmares w/anesthesia meds.   Social Determinants of Health   Financial Resource Strain: Low  Risk   . Difficulty of Paying Living Expenses: Not hard at all  Food Insecurity: No Food Insecurity  . Worried About Charity fundraiser in the Last Year: Never true  . Ran Out of Food in the Last Year: Never true  Transportation Needs: No Transportation Needs  . Lack of Transportation (Medical): No  . Lack of Transportation (Non-Medical): No  Physical Activity: Unknown  . Days of Exercise per Week: 7 days  . Minutes of Exercise per Session: Not on file  Stress: No Stress Concern Present  . Feeling of Stress : Not at all  Social Connections: Moderately Isolated  . Frequency of Communication with Friends and Family: More than three times a week  . Frequency of Social Gatherings with Friends and Family: More than three times a week  . Attends Religious Services: Never  . Active Member of Clubs or Organizations: No  . Attends Archivist Meetings: Never  . Marital Status: Married    Review of Systems: A 12 point ROS discussed and pertinent positives are indicated in the HPI above.  All other systems are negative.  Review of Systems  Constitutional: Positive for appetite change and fatigue.  Respiratory: Positive for shortness of breath. Negative for cough.   Cardiovascular: Positive for leg swelling. Negative for chest pain.  Gastrointestinal: Positive for nausea. Negative for abdominal pain, diarrhea and vomiting.  Genitourinary: Positive for difficulty urinating.  Musculoskeletal: Negative for back pain.  Neurological: Negative for headaches.    Vital Signs: BP (!) 145/64 (BP Location: Left Leg)   Pulse 66   Temp 98.1 F (36.7 C) (Oral)   Resp 20   Ht 5\' 10"  (1.778 m)   Wt 251 lb 15.8 oz (114.3 kg)   SpO2 (!) 89%   BMI 36.16 kg/m   Physical Exam Constitutional:      General: He is not in acute distress. HENT:     Mouth/Throat:     Mouth: Mucous membranes are dry.     Pharynx: Oropharynx is clear.  Cardiovascular:     Rate and Rhythm: Normal rate. Rhythm  irregular.     Pulses: Normal pulses.     Heart sounds: Normal heart sounds.     Comments: Sternal incision is clean and dry, painted with betadine. Pacing wires present, taped to chest.  Pulmonary:     Breath sounds: Rhonchi present.  Abdominal:     General: Abdomen is protuberant. Bowel sounds are normal.  Genitourinary:    Comments: Foley catheter for urinary retention; enlarged prostate Musculoskeletal:     Right lower leg: Edema present.     Left lower leg: Edema present.     Comments: + 4 bilateral lower extremity edema; Unna boots   Skin:    General: Skin is warm and dry.  Neurological:     Mental Status: He is alert and oriented to person, place, and time.    Imaging: DG Chest 1 View  Result Date: 05/25/2020 CLINICAL DATA:  Shortness of breath EXAM: CHEST  1 VIEW COMPARISON:  May 24, 2020 FINDINGS: Right jugular catheter tip is in the superior vena cava. There is a peripherally inserted central catheter from the right side with tip at cavoatrial junction. No pneumothorax. There are pleural effusions bilaterally with ill-defined airspace opacity in each mid and lower lung region. There is cardiomegaly with pulmonary vascularity normal. No adenopathy. Patient is status post coronary artery bypass grafting. There is a left atrial appendage clamp. No bone lesions. IMPRESSION: Catheter positions as described without pneumothorax. Small pleural effusions bilaterally. Ill-defined opacity in each mid and lower lung zone, likely primarily due to atelectatic change, stable. Stable cardiomegaly. Postoperative changes noted. Electronically Signed   By: Lowella Grip III M.D.   On: 05/25/2020 07:56   DG Chest 1 View  Result Date: 05/22/2020 CLINICAL DATA:  Hypoxia EXAM: CHEST  1 VIEW COMPARISON:  May 21, 2020 FINDINGS: Endotracheal tube tip is 5.6 cm above the carina. Nasogastric tube tip and side port are in stomach. Swan-Ganz catheter tip is in the right main pulmonary artery. There  are chest tubes bilaterally and a mediastinal drain. There are temporary pacemaker wires attached to the right heart. No pneumothorax. There is a small right pleural effusion with bibasilar atelectasis. No consolidation. Heart is mildly enlarged, stable. Postoperative changes noted, stable. No adenopathy. No bone lesions. IMPRESSION: Tube and catheter positions as described without pneumothorax. Fairly small right pleural effusion with right and left base atelectasis. Stable cardiac prominence. Appearance similar to 1 day prior. Electronically Signed   By: Lowella Grip III M.D.   On: 05/22/2020 08:30   DG Chest 2 View  Result Date: 05/25/2020 CLINICAL DATA:  74 year old male with shortness of breath and chest pain. EXAM: CHEST - 2 VIEW COMPARISON:  Chest radiograph dated 05/25/2020. FINDINGS: There is shallow inspiration with bibasilar atelectasis similar to prior radiograph. Pneumonia is not excluded clinical correlation is recommended. Trace pleural effusions may be present. No pneumothorax. Stable cardiomediastinal silhouette. Median sternotomy wires and cardiac surgical device. Stable positioning of the right-sided PICC. No acute osseous pathology. Dilated air-filled loops of small bowel in the visualized abdomen measure up to 4 cm may represent obstruction or ileus. Clinical correlation is recommended. IMPRESSION: Shallow inspiration with bibasilar atelectasis similar to prior radiograph. Pneumonia is not excluded. Electronically Signed   By: Anner Crete M.D.   On: 05/25/2020 18:43   CT ANGIO CHEST PE W OR WO CONTRAST  Result Date: 05/16/2020 CLINICAL DATA:  High probability suspicion for pulmonary embolism. Short of breath. EXAM: CT ANGIOGRAPHY CHEST WITH CONTRAST TECHNIQUE: Multidetector CT imaging of the chest was performed using the standard protocol during bolus administration of intravenous contrast. Multiplanar CT image reconstructions and MIPs were obtained to evaluate the vascular  anatomy. CONTRAST:  22mL OMNIPAQUE IOHEXOL 350 MG/ML SOLN COMPARISON:  None. FINDINGS: Cardiovascular: No filling defects within the pulmonary arteries to suggest  acute pulmonary embolism. Three-vessel coronary artery calcification and aortic atherosclerotic calcification. Mediastinum/Nodes: No axillary or supraclavicular adenopathy. No mediastinal or hilar adenopathy. No pericardial fluid. Esophagus normal. Lungs/Pleura: Moderate bilateral pleural effusions. Interlobular septal thickening consistent central edema. No focal infiltrate. No pulmonary infarction. No pneumothorax. Upper Abdomen: Limited view of the liver, kidneys, pancreas are unremarkable. Normal adrenal glands. Musculoskeletal: No aggressive osseous lesion. Degenerative osteophytosis of the spine. Review of the MIP images confirms the above findings. IMPRESSION: 1. No acute pulmonary embolism. 2. Pleural effusion interstitial edema suggest congestive heart failure. 3. Coronary artery calcification and Aortic Atherosclerosis (ICD10-I70.0). Electronically Signed   By: Suzy Bouchard M.D.   On: 05/16/2020 19:29   CARDIAC CATHETERIZATION  Result Date: 05/17/2020 Findings: RA = 17 RV = 50/18 PA = 47/24 (36) PCW = 31 Fick cardiac output/index = 4.3/1.9 PVR = FA sat = 95% PA sat = 51%, 54% Assessment: 1. Ischemic CM with critical LM disease and low output HF Plan/Discussion: Swan and IABP in place. CABG in am. Keep in [place], MD 5:14 PM   CARDIAC CATHETERIZATION  Result Date: 05/17/2020  Ost LM to Mid LM lesion is 99% stenosed.  Ost RCA to Prox RCA lesion is 99% stenosed.  Prox RCA to Mid RCA lesion is 80% stenosed.  Eric Stokes is a 74 y.o. male  [id number] LOCATION:  FACILITY: [facility] PHYSICIAN: Quay Burow, M.D. 12-08-1945 DATE OF PROCEDURE:  05/17/2020 DATE OF DISCHARGE: CARDIAC CATHETERIZATION History obtained from chart review.This is a 73yo male with a hx of sinus bradycardia and PVCs, HTN, OSA on CPAP, HLD and prostate  CA.  He has a remote hx of ETT in 2015 that was normal and then again in 2018 that was normal.  Evaluated earlier this year for knee surgery which he underwent without complications.  He is quite active at home but for the past month has noticed intermittent chest pain  and DOE.  He has noticed a significant decline in his exercise capacity due to DOE and exertional CP.  He has chronic RLE edema ever since having his knee surgery and was on Eliquis in the periop period.   Today had worsening symptoms of CP and SOB and called EMS.  He was found to be in afib with RVR and started on IV Cardizem gtt with HR improve to the low 100's.  Started on IV heparin gtt.  COVID neg.  BNP elevated at 415 and Cxray c/w CHF.  Initial hsTrop 1681 and increased to 4034.  He has not had any further CP.  Cards now asked to admit for further workup of elevated Trop, CP and CHF.   Mr. Blackerby has critical left main/three-vessel disease with high filling pressures.  His LVEDP was 17 but his V wave was 37 suggesting that he has mitral regurgitation.  A 2D echo will be obtained.  He will need additional diuresis.  I discussed his case with Dr. Murvin Natal, cardiothoracic surgeon, who agrees that he needs CABG which will tentatively be planned for tomorrow morning.  In the interim, we will put him in unit 2H for closer observation.  The radial and antecubital sheath were removed.  TR band was placed on the right wrist to achieve patent hemostasis.  The patient left lab in stable condition.  Heparin will be restarted 4 hours after sheath removal without a bolus. Quay Burow. MD, [facility] 05/17/2020 8:53 AM   DG Chest Port 1 View  Result Date: 05/24/2020 CLINICAL DATA:  74 year old male  postoperative day 6 status post CABG. EXAM: PORTABLE CHEST 1 VIEW COMPARISON:  Portable chest 05/23/2020 and earlier. FINDINGS: Portable AP semi upright view at 0551 hours. Swan-Ganz catheter removed. Right IJ introducer sheath remains. Bilateral chest  tubes removed. No pneumothorax. Stable to mildly increased veiling opacity at on the right. Increased veiling opacity at the left lung base. Stable vascularity, no overt edema. Stable cardiac size and mediastinal contours. Paucity bowel gas in the upper abdomen. Stable visualized osseous structures. IMPRESSION: 1. Swan-Ganz catheter and bilateral chest tubes removed. 2. No pneumothorax. Increased veiling opacity greater on the right, favor combination of pleural effusion and atelectasis. Electronically Signed   By: Genevie Ann M.D.   On: 05/24/2020 07:57   DG Chest Port 1 View  Result Date: 05/23/2020 CLINICAL DATA:  Status post extubation EXAM: PORTABLE CHEST 1 VIEW COMPARISON:  May 22, 2020 FINDINGS: Endotracheal tube and nasogastric tube have been removed. Swan-Ganz catheter tip is in the right inter lobar pulmonary artery. Chest tubes and mediastinal drain unchanged in position. No evident pneumothorax. Persistent right pleural effusion with bibasilar atelectasis. No new opacity evident. Stable cardiac prominence. Pulmonary vascularity normal. No adenopathy. No bone lesions. IMPRESSION: Tube and catheter positions as described without pneumothorax. Stable cardiac prominence. Persistent fairly small right pleural effusion with bibasilar atelectasis. No new opacity evident. Electronically Signed   By: Lowella Grip III M.D.   On: 05/23/2020 08:20   DG Chest Port 1 View  Result Date: 05/21/2020 CLINICAL DATA:  Hypoxia EXAM: PORTABLE CHEST 1 VIEW COMPARISON:  May 21, 2019 FINDINGS: Endotracheal tube tip is 8.5 cm above the carina. Nasogastric tube tip and side port are below the diaphragm. Swan-Ganz catheter tip is at the origin of the right inter lobar pulmonary artery. Bilateral chest tubes and mediastinal drain remain. No pneumothorax. Persistent right pleural effusion with bibasilar atelectasis. No new opacity. Patient status post coronary artery bypass grafting and left atrial appendage clamp  placement. Stable cardiomegaly. No bone lesions. IMPRESSION: Tube and catheter positions as described without pneumothorax. Right pleural effusion with bibasilar atelectasis. No new opacity. Stable cardiac prominence. Electronically Signed   By: Lowella Grip III M.D.   On: 05/21/2020 08:06   DG Chest Port 1 View  Result Date: 05/20/2020 CLINICAL DATA:  Status post CABG. EXAM: PORTABLE CHEST 1 VIEW COMPARISON:  05/19/2020 FINDINGS: The patient is mildly rotated to the right. Sequelae of CABG are again identified. Endotracheal tube terminates at the clavicular heads. Enteric tube terminates in the left upper abdomen in the expected region of the stomach. A right jugular Swan-Ganz catheter has been retracted and terminates over the proximal right main pulmonary artery. Bilateral chest tubes and a mediastinal drain remain in place. The cardiomediastinal silhouette is unchanged with cardiomegaly again noted. Lung volumes remain mildly low with similar appearance of mild bilateral interstitial opacities. A small right pleural effusion is unchanged. No pneumothorax is identified. IMPRESSION: 1. Support devices as above. 2. Unchanged small right pleural effusion and suspected mild interstitial edema. Electronically Signed   By: Logan Bores M.D.   On: 05/20/2020 08:21   DG Chest Port 1 View  Result Date: 05/19/2020 CLINICAL DATA:  Shortness of breath EXAM: PORTABLE CHEST 1 VIEW COMPARISON:  May 18, 2020 FINDINGS: The cardiomediastinal silhouette is unchanged and enlarged in contour.Status post median sternotomy. ETT tip terminates 5.7 Cm above the carina. Bilateral chest tubes. Mediastinal drain. IABP metallic marker terminates 3 cm below the aortic arch in the superior thoracic aorta. Enteric tube side port  projects over the stomach. RIGHT IJ PA catheter tip terminates over the RIGHT lower lobar pulmonary artery. Trace RIGHT pleural effusion. Elevation of the RIGHT hemidiaphragm. No significant  pneumothorax. Mild interstitial prominence likely reflecting pulmonary edema. Scattered linear opacities consistent with atelectasis. Visualized abdomen is unremarkable. Multilevel degenerative changes of the thoracic spine. IMPRESSION: 1.  Support apparatus as described above. 2. Mild pulmonary edema with trace RIGHT pleural effusion and scattered atelectasis. Electronically Signed   By: Valentino Saxon MD   On: 05/19/2020 09:06   DG Chest Port 1 View  Result Date: 05/18/2020 CLINICAL DATA:  Postsurgery EXAM: PORTABLE CHEST 1 VIEW COMPARISON:  Comparison chest x-ray 05/16/2020, CT chest 05/16/2020 FINDINGS: Interval placement of multiple lines and tubes as follows: Endotracheal tube with tip projecting approximately 3.5 cm above the carina. Enteric tube noted coursing below the diaphragm with tip collimated off view and side port likely distal to the gastroesophageal junction. Right internal jugular venous approach Swan-Ganz catheter with tip terminating in the distal right main pulmonary artery. Inferior approach bilateral chest tubes overlie the hemi-thoraces. Inferior approach mediastinal drain overlies the mediastinum. Cardiac pacing wires overlie the lower mediastinum. Interval placement of sternotomy wires that appear intact. Vascular clips overlie the mediastinum. Cardiomediastinal silhouette is prominent likely due to AP portable view and low lung volumes. Low lung volumes with bibasilar compressive changes. No focal consolidation. No pulmonary edema. No pleural effusion. No significant pneumothorax identified. No acute osseous abnormality. Degenerative changes of the right shoulder. IMPRESSION: 1. Right internal jugular venous approach Swan-Ganz catheter with tip terminating in the distal right main pulmonary artery. Consider retracting by 1-2 cm. The remaining new lines and tubes appear to be in good position. 2. Low lung volumes with no acute cardiopulmonary abnormality. Electronically Signed    By: Iven Finn M.D.   On: 05/18/2020 15:20   DG Chest Port 1 View  Result Date: 05/16/2020 CLINICAL DATA:  Chest pain and shortness of breath today EXAM: PORTABLE CHEST 1 VIEW COMPARISON:  None. FINDINGS: Cardiomegaly and diffuse interstitial prominence with cephalized blood flow. Trace pleural fluid is possible on the right. Negative aortic contours for technique. IMPRESSION: CHF pattern. Electronically Signed   By: Monte Fantasia M.D.   On: 05/16/2020 11:58   ECHOCARDIOGRAM COMPLETE  Result Date: 05/17/2020    ECHOCARDIOGRAM REPORT   Patient Name:   TAVIAN CALLANDER Date of Exam: 05/17/2020 Medical Rec #:  [id number]          Height:       70.0 in Accession #:    [id number]         Weight:       240.0 lb Date of Birth:  [birth year]         BSA:          2.255 m Patient Age:    2 years           BP:           132/79 mmHg Patient Gender: M                  HR:           88 bpm. Exam Location:  Inpatient Procedure: 2D Echo and Intracardiac Opacification Agent Indications:     Non-STEMI (non-ST elevated myocardial infarction) ([place]) [I21.4                  (ICD-10-CM)]  Paroxysmal atrial fibrillation (H[facility]) [I48.0 (ICD-10-CM)]                  Acute on chronic combined systolic and diastolic CHF                  (congestive heart failure) ([place]) [I50.43 (ICD-10-CM)]  History:         Patient has prior history of Echocardiogram examinations, most                  recent 03/01/2019. CAD, COPD and TIA, Aortic Valve Disease,                  Arrythmias:Palpitations, Signs/Symptoms:Dyspnea and Chest                  discomfort; Risk Factors:Hypertension, Former Smoker and                  Dyslipidemia. History of cancer.  Sonographer:     Darlina Sicilian RDCS Referring Phys:  [id number] Leanor Kail Diagnosing Phys: Gwyndolyn Kaufman MD  Sonographer Comments: Image acquisition challenging due to respiratory motion and Post cath unable to turn. IMPRESSIONS  1. There is akinesis of the  mid-to-distal anterolateral, all mid-to-distal septal, mid-to-distal anterior, and all apical segments. The rest of the LV segments are hypokinetic. Findings are suggestive of multivessel CAD.Marland Kitchen Left ventricular ejection fraction, by estimation, is 25 to 30%. The left ventricle has severely decreased function. The left ventricle demonstrates regional wall motion abnormalities (see scoring diagram/findings for description). There is moderate concentric left ventricular hypertrophy. Left ventricular diastolic parameters are indeterminate.  2. Right ventricular systolic function is normal. The right ventricular size is normal. There is mildly elevated pulmonary artery systolic pressure.  3. Left atrial size was moderately dilated.  4. The mitral valve is degenerative. Mild mitral valve regurgitation. No evidence of mitral stenosis.  5. The aortic valve is tricuspid. There is moderate calcification of the aortic valve. There is moderate thickening of the aortic valve. Aortic valve regurgitation is not visualized. Mild aortic valve stenosis.  6. The inferior vena cava is dilated in size with >50% respiratory variability, suggesting right atrial pressure of 8 mmHg. Comparison(s): Compared to prior TTE on 03/11/2019, there is now severe LV dysfunction with regional wall motion abnormalities as described above. FINDINGS  Left Ventricle: There is akinesis of the mid-to-distal anterolateral, all mid-to-distal septal, mid-to-distal anterior, and all apical segments. The rest of the LV segments are hypokinetic. Findings are suggestive of multivessel CAD. Left ventricular ejection fraction, by estimation, is 25 to 30%. Definity contrast agent was given IV to delineate the left ventricular endocardial borders. The left ventricular internal cavity size was normal in size. There is moderate concentric left ventricular hypertrophy. Left ventricular diastolic parameters are indeterminate. Right Ventricle: The right ventricular size is  normal. Right ventricular systolic function is normal. There is mildly elevated pulmonary artery systolic pressure. The tricuspid regurgitant velocity is 2.70 m/s, and with an assumed right atrial pressure of 8 mmHg, the estimated right ventricular systolic pressure is 03.4 mmHg. Left Atrium: Left atrial size was moderately dilated. Right Atrium: Right atrial size was normal in size. Pericardium: There is no evidence of pericardial effusion. Mitral Valve: The mitral valve is degenerative in appearance. There is mild thickening of the mitral valve leaflet(s). There is mild calcification of the mitral valve leaflet(s). Mild mitral annular calcification. Mild mitral valve regurgitation. No evidence of mitral valve stenosis. Tricuspid Valve: The tricuspid valve is normal in  structure. Tricuspid valve regurgitation is mild. Aortic Valve: The aortic valve is tricuspid. There is moderate calcification of the aortic valve. There is moderate thickening of the aortic valve. There is mild to moderate aortic valve annular calcification. Aortic valve regurgitation is not visualized. Mild aortic stenosis is present. Aortic valve mean gradient measures 5.5 mmHg. Aortic valve peak gradient measures 10.1 mmHg. Aortic valve area, by VTI measures 1.35 cm. AVA 1.5 cm2. Pulmonic Valve: The pulmonic valve was not well visualized. Pulmonic valve regurgitation is trivial. Aorta: The aortic root and ascending aorta are structurally normal, with no evidence of dilitation. Venous: The inferior vena cava is dilated in size with greater than 50% respiratory variability, suggesting right atrial pressure of 8 mmHg. IAS/Shunts: No atrial level shunt detected by color flow Doppler.  LEFT VENTRICLE PLAX 2D LVOT diam:     2.20 cm      Diastology LV SV:         36           LV e' medial:    5.74 cm/s LV SV Index:   16           LV E/e' medial:  18.0 LVOT Area:     3.80 cm     LV e' lateral:   10.45 cm/s                             LV E/e' lateral:  9.9  LV Volumes (MOD) LV vol d, MOD A2C: 239.0 ml LV vol d, MOD A4C: 242.0 ml LV vol s, MOD A2C: 154.0 ml LV vol s, MOD A4C: 191.0 ml LV SV MOD A2C:     85.0 ml LV SV MOD A4C:     242.0 ml LV SV MOD BP:      69.3 ml RIGHT VENTRICLE TAPSE (M-mode): 1.2 cm LEFT ATRIUM             Index       RIGHT ATRIUM           Index LA Vol (A2C):   60.6 ml 26.87 ml/m RA Area:     16.10 cm LA Vol (A4C):   87.3 ml 38.71 ml/m RA Volume:   36.50 ml  16.18 ml/m LA Biplane Vol: 73.0 ml 32.37 ml/m  AORTIC VALVE AV Area (Vmax):    1.34 cm AV Area (Vmean):   1.14 cm AV Area (VTI):     1.35 cm AV Vmax:           158.75 cm/s AV Vmean:          109.125 cm/s AV VTI:            0.267 m AV Peak Grad:      10.1 mmHg AV Mean Grad:      5.5 mmHg LVOT Vmax:         55.90 cm/s LVOT Vmean:        32.600 cm/s LVOT VTI:          0.095 m LVOT/AV VTI ratio: 0.35  AORTA Ao Root diam: 3.30 cm Ao Asc diam:  3.60 cm MITRAL VALVE                TRICUSPID VALVE MV Area (PHT): 4.30 cm     TR Peak grad:   29.2 mmHg MV Decel Time: 176 msec     TR Vmax:        270.00 cm/s MR Peak  grad: 59.0 mmHg MR Mean grad: 38.0 mmHg     SHUNTS MR Vmax:      384.00 cm/s   Systemic VTI:  0.09 m MR Vmean:     295.0 cm/s    Systemic Diam: 2.20 cm MV E velocity: 103.33 cm/s Gwyndolyn Kaufman MD Electronically signed by Gwyndolyn Kaufman MD Signature Date/Time: 05/17/2020/12:57:04 PM    Final (Updated)    ECHO INTRAOPERATIVE TEE  Result Date: 05/19/2020  *INTRAOPERATIVE TRANSESOPHAGEAL REPORT *  Patient Name:   Eric Stokes Date of Exam: 05/18/2020 Medical Rec #:  [id number]          Height:       70.0 in Accession #:    [id number]         Weight:       238.5 lb Date of Birth:  11/20/1945         BSA:          2.25 m Patient Age:    65 years           BP:           96/63 mmHg Patient Gender: M                  HR:           109 bpm. Exam Location:  Inpatient Transesophogeal exam was perform intraoperatively during surgical procedure. Patient was closely monitored  under general anesthesia during the entirety of examination. Indications:     CABG Sonographer:     Clayton Lefort RDCS (AE) Performing Phys: [id number] Sharalyn Ink ZIMMERMAN Complications: No known complications during this procedure. POST-OP IMPRESSIONS - Left Ventricle: The left ventricle is unchanged from pre-bypass. - Aorta: The aorta appears unchanged from pre-bypass. - Left Atrial Appendage: Has been surgically closed and the repair appears to be oversewn. - Aortic Valve: The aortic valve appears unchanged from pre-bypass. - Mitral Valve: The mitral valve appears unchanged from pre-bypass. - Tricuspid Valve: The tricuspid valve appears unchanged from pre-bypass. - Interatrial Septum: The interatrial septum appears unchanged from pre-bypass. - Pericardium: The pericardium appears unchanged from pre-bypass. PRE-OP FINDINGS  Left Ventricle: The left ventricle has moderately reduced systolic function, with an ejection fraction of 35-40%. The cavity size was moderately dilated. There is no increase in left ventricular wall thickness. Right Ventricle: The right ventricle has normal systolic function. The cavity was normal. There is no increase in right ventricular wall thickness. Left Atrium: Left atrial size was normal in size. The left atrial appendage is well visualized and there is no evidence of thrombus present. Left atrial appendage velocity is reduced at less than 40 cm/s. Right Atrium: Right atrial size was normal in size. Interatrial Septum: No atrial level shunt detected by color flow Doppler. Pericardium: There is no evidence of pericardial effusion. There is a moderate pleural effusion in both left and right lateral regions. Mitral Valve: The mitral valve is normal in structure. Mitral valve regurgitation is mild by color flow Doppler. Absent A-wave on MV spectral Doppler tracing, consistent with atrial fibrillation. There is No evidence of mitral stenosis. Tricuspid Valve: The tricuspid valve was normal in  structure. Tricuspid valve regurgitation is mild-moderate by color flow Doppler. The jet is directed toward the atrial septum. Gordy Councilman catheter is seen to traverse the valve at the level of the regurgitation. Aortic Valve: The aortic valve is tricuspid. There is moderate thickening of the aortic valve and There is moderate calcification of the aortic valve. Aortic valve  regurgitation was not visualized by color flow Doppler. The right and left coronary cusps have severely restricted mobility. Pulmonic Valve: The pulmonic valve was normal in structure. Pulmonic valve regurgitation is not visualized by color flow Doppler. Aorta: The aortic root, ascending aorta and aortic arch are normal in size and structure. Intra-aortic balloon pump visualized in the descending thoracic aorta.  +------------------+---------++ LV Volumes (MOD)            +------------------+---------++ LV area d, A2C:   36.80 cm +------------------+---------++ LV area d, A4C:   37.50 cm +------------------+---------++ LV area s, A2C:   27.50 cm +------------------+---------++ LV area s, A4C:   26.00 cm +------------------+---------++ LV major d, A2C:  8.58 cm   +------------------+---------++ LV major d, A4C:  8.71 cm   +------------------+---------++ LV major s, A2C:  8.25 cm   +------------------+---------++ LV major s, A4C:  8.30 cm   +------------------+---------++ LV vol d, MOD A2C:133.0 ml  +------------------+---------++ LV vol d, MOD A4C:132.0 ml  +------------------+---------++ LV vol s, MOD A2C:77.7 ml   +------------------+---------++ LV vol s, MOD A4C:66.1 ml   +------------------+---------++ LV SV MOD A2C:    55.3 ml   +------------------+---------++ LV SV MOD A4C:    132.0 ml  +------------------+---------++ LV SV MOD BP:     62.0 ml   +------------------+---------++ +---------------+---------++ RIGHT VENTRICLE          +---------------+---------++ RVSP:           35.4 mmHg +---------------+---------++ +------------+----------+++ RIGHT ATRIUM           +------------+----------+++ RA Pressure:10.00 mmHg +------------+----------+++  +-------------+------------++ AORTIC VALVE              +-------------+------------++ AV Vmax:     199.00 cm/s  +-------------+------------++ AV Vmean:    141.000 cm/s +-------------+------------++ AV VTI:      0.360 m      +-------------+------------++ AV Peak Grad:15.8 mmHg    +-------------+------------++ AV Mean Grad:9.0 mmHg     +-------------+------------++ +---------------+-----------++ TRICUSPID VALVE            +---------------+-----------++ TR Peak grad:  25.4 mmHg   +---------------+-----------++ TR Vmax:       252.00 cm/s +---------------+-----------++ Estimated RAP: 10.00 mmHg  +---------------+-----------++ RVSP:          35.4 mmHg   +---------------+-----------++  Albertha Ghee MD Electronically signed by Albertha Ghee MD Signature Date/Time: 05/19/2020/7:51:58 AM    Final    VAS US DOPPLER PRE CABG  Result Date: 05/19/2020 PREOPERATIVE VASCULAR EVALUATION  Indications:      Pre-CABG. Limitations:      Bandages, catheters, balloon pump insertion. Arms and legs not                   assessed secondary to balloon pump. Unable to assess right                   carotid due to bandaging/catheter. Comparison Study: No prior studies. Performing Technologist: Darlin Coco, RDMS  Examination Guidelines: A complete evaluation includes B-mode imaging, spectral Doppler, color Doppler, and power Doppler as needed of all accessible portions of each vessel. Bilateral testing is considered an integral part of a complete examination. Limited examinations for reoccurring indications may be performed as noted.  Left Carotid Findings: +----------+--------+--------+--------+------------------+------------------+           PSV cm/sEDV cm/sStenosisDescribe          Comments            +----------+--------+--------+--------+------------------+------------------+ CCA Prox  intimal thickening +----------+--------+--------+--------+------------------+------------------+ CCA Distal                                          intimal thickening +----------+--------+--------+--------+------------------+------------------+ ICA Prox                          mild, heterogenoustortuous           +----------+--------+--------+--------+------------------+------------------+ ECA                                                 patent             +----------+--------+--------+--------+------------------+------------------+ +----------+--------+--------+--------+------------+ SubclavianPSV cm/sEDV cm/sDescribeArm Pressure +----------+--------+--------+--------+------------+                           WNL                  +----------+--------+--------+--------+------------+ +---------+--------+--------+---------+ VertebralPSV cm/sEDV cm/sAntegrade +---------+--------+--------+---------+  Electronically signed by Harold Barban MD on 05/19/2020 at 1:10:20 PM.   Final    ECHOCARDIOGRAM LIMITED  Result Date: 05/19/2020    ECHOCARDIOGRAM LIMITED REPORT   Patient Name:   Eric Stokes Date of Exam: 05/19/2020 Medical Rec #:  [id number]          Height:       70.0 in Accession #:    [id number]         Weight:       258.0 lb Date of Birth:  May 11, 1946         BSA:          2.326 m Patient Age:    64 years           BP:           137/52 mmHg Patient Gender: M                  HR:           93 bpm. Exam Location:  Inpatient Procedure: Limited Echo, Color Doppler and Intracardiac Opacification Agent Indications:    Cardiomyopathy-Ischemic  History:        Patient has prior history of Echocardiogram examinations, most                 recent 05/17/2020. CHF, CAD, COPD, Aortic Valve Disease,                 Arrythmias:Atrial Fibrillation;  Risk Factors:Hypertension,                 Former Smoker and Dyslipidemia. TIA.  Sonographer:    Clayton Lefort RDCS (AE) Referring Phys: [id number] [facility]  Sonographer Comments: Suboptimal apical window and echo performed with patient supine and on artificial respirator. No subcostal images due to bandages in subcostal area. IMPRESSIONS  1. Left ventricular ejection fraction, by estimation, is 25%. The left ventricle has severely decreased function.  2. Right ventricular systolic function is normal. The right ventricular size is normal.  3. The aortic valve has an indeterminant number of cusps. There is moderate calcification of the aortic valve. There is moderate thickening of the aortic valve.  4. Limited echo to evaluate LV function FINDINGS  Left Ventricle: The anteroseptal wall  is akinetic. Anterolateral wall,entire apex, mid to distal anterior walls are hypokinetic. Left ventricular ejection fraction, by estimation, is 25%. The left ventricle has severely decreased function. Definity contrast agent was given IV to delineate the left ventricular endocardial borders. Right Ventricle: The right ventricular size is normal. No increase in right ventricular wall thickness. Right ventricular systolic function is normal. Pericardium: There is no evidence of pericardial effusion. Aortic Valve: The aortic valve has an indeterminant number of cusps. There is moderate calcification of the aortic valve. There is moderate thickening of the aortic valve. There is moderate aortic valve annular calcification. Pulmonic Valve: The pulmonic valve was not well visualized. Pulmonic valve regurgitation is not visualized. No evidence of pulmonic stenosis. Aorta: The aortic root is normal in size and structure. Additional Comments: A venous catheter is visualized. LEFT VENTRICLE PLAX 2D LVIDd:         4.40 cm LVIDs:         3.50 cm LV PW:         1.50 cm LV IVS:        1.60 cm LVOT diam:     2.00 cm LVOT Area:     3.14 cm  LEFT  ATRIUM         Index LA diam:    4.60 cm 1.98 cm/m   AORTA Ao Root diam: 3.20 cm  SHUNTS Systemic Diam: 2.00 cm Carlyle Dolly MD Electronically signed by Carlyle Dolly MD Signature Date/Time: 05/19/2020/4:34:36 PM    Final    [place] EKG SITE RITE  Result Date: 05/24/2020 If Site Rite image not attached, placement could not be confirmed due to current cardiac rhythm.   Labs:  CBC: Recent Labs    05/23/20 0441 05/24/20 0347 05/25/20 0338 05/26/20 0355  WBC 4.7 4.9 5.7 9.8  HGB 10.7* 10.9* 10.6* 12.2*  HCT 31.5* 33.2* 33.0* 37.1*  PLT 110* 138* 178 278    COAGS: Recent Labs    02/27/20 1026 05/17/20 1851 05/18/20 1507  INR 1.1 1.3* 1.9*  APTT  --   --  34    BMP: Recent Labs    05/20/20 1725 05/21/20 0000 05/21/20 0252 05/21/20 0252 05/21/20 1104 05/21/20 2022 05/22/20 0500 05/22/20 0853 05/23/20 0441 05/24/20 0347 05/25/20 0338 05/26/20 0355  NA 137   < > 136   < > 133*  --  134*   < > 133* 134* 133* 130*  K 4.0   < > 3.5   < > 3.6   < > 3.7   < > 3.6 3.4* 3.4* 3.3*  CL 101   < > 99   < > 97*  --  96*   < > 93* 92* 91* 87*  CO2 28  --  27   < > 27  --  28   < > 28 32 32 31  GLUCOSE 127*   < > 126*   < > 138*  --  145*   < > 126* 133* 121* 134*  BUN 12   < > 12   < > 11  --  11   < > 15 17 16 16   CALCIUM 8.0*  --  7.8*   < > 7.7*  --  7.8*   < > 7.7* 7.8* 8.0* 8.2*  CREATININE 0.90   < > 0.81   < > 0.83  --  0.85   < > 0.97 0.95 0.86 0.99  GFRNONAA >60  --  >60   < > >60  --  >  60   < > >60 >60 >60 >60  GFRAA >60  --  >60  --  >60  --  >60  --   --   --   --   --    < > = values in this interval not displayed.    LIVER FUNCTION TESTS: Recent Labs    01/09/20 1332 02/27/20 1026 05/16/20 1150 05/18/20 2047  BILITOT 0.6 1.2 1.9* 1.7*  AST 16 21 35 57*  ALT 18 21 26 25   ALKPHOS 52 45 50 28*  PROT 7.1 7.4 6.9 5.2*  ALBUMIN 4.4 4.2 3.7 3.3*    TUMOR MARKERS: No results for input(s): AFPTM, CEA, CA199, CHROMGRNA in the last 8760 hours.  Assessment  and Plan:  Pleural effusions s/p CABG: Dexter L. Maday, 74 year old male, is tentatively scheduled to be seen 05/26/20 at the [facility] department for an image-guided right chest tube placement.   Risks and benefits discussed with the patient including bleeding, infection, damage to adjacent structures and sepsis.  All of the patient's questions were answered, patient is agreeable to proceed.  The patient has been NPO. Labs and vitals have been reviewed. He takes Eliquis daily, last dose 0942 today.   Consent signed and in chart.  Thank you for this interesting consult.  I greatly enjoyed meeting CHANEL MCKESSON and look forward to participating in their care.  A copy of this report was sent to the requesting provider on this date.  Electronically Signed: Soyla Dryer, AGACNP-BC [phone] 05/26/2020, 9:27 AM   I spent a total of 40 Minutes    in face to face in clinical consultation, greater than 50% of which was counseling/coordinating care for right chest tube placement.

## 2020-05-27 ENCOUNTER — Other Ambulatory Visit: Payer: Self-pay | Admitting: Family Medicine

## 2020-05-27 ENCOUNTER — Inpatient Hospital Stay (HOSPITAL_COMMUNITY): Payer: No Typology Code available for payment source

## 2020-05-27 DIAGNOSIS — I10 Essential (primary) hypertension: Secondary | ICD-10-CM

## 2020-05-27 DIAGNOSIS — I351 Nonrheumatic aortic (valve) insufficiency: Secondary | ICD-10-CM

## 2020-05-27 DIAGNOSIS — I214 Non-ST elevation (NSTEMI) myocardial infarction: Secondary | ICD-10-CM | POA: Diagnosis not present

## 2020-05-27 DIAGNOSIS — R570 Cardiogenic shock: Secondary | ICD-10-CM | POA: Diagnosis not present

## 2020-05-27 DIAGNOSIS — I4891 Unspecified atrial fibrillation: Secondary | ICD-10-CM | POA: Diagnosis not present

## 2020-05-27 DIAGNOSIS — I361 Nonrheumatic tricuspid (valve) insufficiency: Secondary | ICD-10-CM | POA: Diagnosis not present

## 2020-05-27 DIAGNOSIS — I350 Nonrheumatic aortic (valve) stenosis: Secondary | ICD-10-CM

## 2020-05-27 LAB — COMPREHENSIVE METABOLIC PANEL
ALT: 30 U/L (ref 0–44)
AST: 28 U/L (ref 15–41)
Albumin: 2.7 g/dL — ABNORMAL LOW (ref 3.5–5.0)
Alkaline Phosphatase: 50 U/L (ref 38–126)
Anion gap: 10 (ref 5–15)
BUN: 26 mg/dL — ABNORMAL HIGH (ref 8–23)
CO2: 30 mmol/L (ref 22–32)
Calcium: 8 mg/dL — ABNORMAL LOW (ref 8.9–10.3)
Chloride: 90 mmol/L — ABNORMAL LOW (ref 98–111)
Creatinine, Ser: 1.38 mg/dL — ABNORMAL HIGH (ref 0.61–1.24)
GFR, Estimated: 50 mL/min — ABNORMAL LOW (ref >60)
Glucose, Bld: 150 mg/dL — ABNORMAL HIGH (ref 70–99)
Potassium: 3.5 mmol/L (ref 3.5–5.1)
Sodium: 130 mmol/L — ABNORMAL LOW (ref 135–145)
Total Bilirubin: 0.8 mg/dL (ref 0.3–1.2)
Total Protein: 5.5 g/dL — ABNORMAL LOW (ref 6.5–8.1)

## 2020-05-27 LAB — GLUCOSE, CAPILLARY
Glucose-Capillary: 115 mg/dL — ABNORMAL HIGH (ref 70–99)
Glucose-Capillary: 115 mg/dL — ABNORMAL HIGH (ref 70–99)
Glucose-Capillary: 116 mg/dL — ABNORMAL HIGH (ref 70–99)
Glucose-Capillary: 123 mg/dL — ABNORMAL HIGH (ref 70–99)
Glucose-Capillary: 123 mg/dL — ABNORMAL HIGH (ref 70–99)
Glucose-Capillary: 129 mg/dL — ABNORMAL HIGH (ref 70–99)
Glucose-Capillary: 147 mg/dL — ABNORMAL HIGH (ref 70–99)

## 2020-05-27 LAB — BASIC METABOLIC PANEL
Anion gap: 12 (ref 5–15)
BUN: 31 mg/dL — ABNORMAL HIGH (ref 8–23)
CO2: 29 mmol/L (ref 22–32)
Calcium: 7.7 mg/dL — ABNORMAL LOW (ref 8.9–10.3)
Chloride: 90 mmol/L — ABNORMAL LOW (ref 98–111)
Creatinine, Ser: 1.82 mg/dL — ABNORMAL HIGH (ref 0.61–1.24)
GFR, Estimated: 36 mL/min — ABNORMAL LOW (ref >60)
Glucose, Bld: 132 mg/dL — ABNORMAL HIGH (ref 70–99)
Potassium: 3.8 mmol/L (ref 3.5–5.1)
Sodium: 131 mmol/L — ABNORMAL LOW (ref 135–145)

## 2020-05-27 LAB — CBC
HCT: 30.3 % — ABNORMAL LOW (ref 39.0–52.0)
Hemoglobin: 10.3 g/dL — ABNORMAL LOW (ref 13.0–17.0)
MCH: 29.1 pg (ref 26.0–34.0)
MCHC: 34 g/dL (ref 30.0–36.0)
MCV: 85.6 fL (ref 80.0–100.0)
Platelets: 276 10*3/uL (ref 150–400)
RBC: 3.54 MIL/uL — ABNORMAL LOW (ref 4.22–5.81)
RDW: 14.2 % (ref 11.5–15.5)
WBC: 7.8 10*3/uL (ref 4.0–10.5)
nRBC: 0 % (ref 0.0–0.2)

## 2020-05-27 LAB — COOXEMETRY PANEL
Carboxyhemoglobin: 1.3 % (ref 0.5–1.5)
Methemoglobin: 0.9 % (ref 0.0–1.5)
O2 Saturation: 71.1 %
Total hemoglobin: 10.3 g/dL — ABNORMAL LOW (ref 12.0–16.0)

## 2020-05-27 LAB — ECHOCARDIOGRAM LIMITED
AR max vel: 2.46 cm2
AV Mean grad: 11.4 mmHg
AV Peak grad: 18.4 mmHg
Ao pk vel: 2.15 m/s
Area-P 1/2: 4.06 cm2
Calc EF: 44.6 %
Height: 70 in
S' Lateral: 4.2 cm
Single Plane A2C EF: 48.9 %
Single Plane A4C EF: 40.8 %
Weight: 4063.52 oz

## 2020-05-27 MED ORDER — AMIODARONE IV BOLUS ONLY 150 MG/100ML: 150.0000 mg | Freq: Once | INTRAVENOUS | Status: DC

## 2020-05-27 MED ORDER — AMIODARONE LOAD VIA INFUSION
150.0000 mg | Freq: Once | INTRAVENOUS | Status: AC
  Administered 2020-05-27: 150 mg via INTRAVENOUS
  Filled 2020-05-27: qty 83.34

## 2020-05-27 MED ORDER — MILRINONE LACTATE IN DEXTROSE 20-5 MG/100ML-% IV SOLN
0.1250 ug/kg/min | INTRAVENOUS | Status: DC
  Administered 2020-05-27 – 2020-05-28 (×2): 0.125 ug/kg/min via INTRAVENOUS
  Filled 2020-05-27: qty 100

## 2020-05-27 MED ORDER — TRAZODONE HCL 50 MG PO TABS
50.0000 mg | ORAL_TABLET | Freq: Once | ORAL | Status: AC
  Administered 2020-05-28: 50 mg via ORAL
  Filled 2020-05-27: qty 1

## 2020-05-27 MED ORDER — ALBUMIN HUMAN 5 % IV SOLN
25.0000 g | Freq: Once | INTRAVENOUS | Status: AC
  Administered 2020-05-27: 12.5 g via INTRAVENOUS
  Filled 2020-05-27: qty 500

## 2020-05-27 MED ORDER — AMIODARONE HCL 200 MG PO TABS
400.0000 mg | ORAL_TABLET | Freq: Two times a day (BID) | ORAL | Status: DC
  Administered 2020-05-27 (×2): 400 mg via ORAL
  Filled 2020-05-27 (×2): qty 2

## 2020-05-27 MED ORDER — PERFLUTREN LIPID MICROSPHERE
1.0000 mL | INTRAVENOUS | Status: AC | PRN
  Administered 2020-05-27: 2 mL via INTRAVENOUS
  Filled 2020-05-27: qty 10

## 2020-05-27 NOTE — Progress Notes (Addendum)
Patient ID: Eric Stokes, male   DOB: [birth year], 74 y.o.   MRN: [id number] P    Advanced Heart Failure Rounding Note  PCP-Cardiologist: No primary care provider on file.   Subjective:    Taken to OR 10/1 for CABG + MAZE and LAA clip.   Was not feeling well yesterday. Chest CT and echo done.   Echo EF ~25% with moderate RV dysfunction. No effusion CT - consolidation/atx RLL. Small effusion. (too small for pigtail)  Remains on milrinone 0.125 mcg + lasix drip 6 mg per hour. Got one does of metolazone.   Feels much better today. Less SOB. Co-ox 71% Only modest diuresis. Weight recorded as up 2 pounds.  Creatinine 1.0 -> 1.8. CVP 5-6   Objective:   Weight Range: 115.2 kg Body mass index is 36.44 kg/m.   Vital Signs:   Temp:  [97.9 F (36.6 C)-98.4 F (36.9 C)] 97.9 F (36.6 C) (10/10 0800) Pulse Rate:  [73-121] 73 (10/10 0800) Resp:  [14-19] 19 (10/10 0800) BP: (60-157)/(42-99) 111/52 (10/10 0800) SpO2:  [94 %-97 %] 94 % (10/10 0300) Weight:  [115.2 kg] 115.2 kg (10/10 0537) Last BM Date: 05/26/20  Weight change: Filed Weights   05/25/20 0500 05/26/20 0600 05/27/20 0537  Weight: 115.7 kg 114.3 kg 115.2 kg    Intake/Output:   Intake/Output Summary (Last 24 hours) at 05/27/2020 1011 Last data filed at 05/27/2020 0530 Gross per 24 hour  Intake 452.73 ml  Output 975 ml  Net -522.27 ml      Physical Exam   General:  Sitting up in chair No resp difficulty HEENT: normal Neck: supple. no JVD. Carotids 2+ bilat; no bruits. No lymphadenopathy or thryomegaly appreciated. Cor: PMI nondisplaced. Regular rate & rhythm. +rub (les prominent) Lungs: clear Abdomen: soft, nontender, nondistended. No hepatosplenomegaly. No bruits or masses. Good bowel sounds. Extremities: no cyanosis, clubbing, rash, trace edema + UNNA Neuro: alert & orientedx3, cranial nerves grossly intact. moves all 4 extremities w/o difficulty. Affect pleasant   Telemetry   SR 70s Personally  reviewed  Labs    CBC Recent Labs    05/26/20 0355 05/27/20 0415  WBC 9.8 7.8  HGB 12.2* 10.3*  HCT 37.1* 30.3*  MCV 85.3 85.6  PLT 278 956   Basic Metabolic Panel Recent Labs    05/26/20 0355 05/27/20 0415  NA 130* 131*  K 3.3* 3.8  CL 87* 90*  CO2 31 29  GLUCOSE 134* 132*  BUN 16 31*  CREATININE 0.99 1.82*  CALCIUM 8.2* 7.7*  MG 1.9  --    Liver Function Tests No results for input(s): AST, ALT, ALKPHOS, BILITOT, PROT, ALBUMIN in the last 72 hours. No results for input(s): LIPASE, AMYLASE in the last 72 hours. Cardiac Enzymes No results for input(s): CKTOTAL, CKMB, CKMBINDEX, TROPONINI in the last 72 hours.  BNP: BNP (last 3 results) Recent Labs    05/16/20 1155  BNP 415.7*    ProBNP (last 3 results) No results for input(s): PROBNP in the last 8760 hours.   D-Dimer No results for input(s): DDIMER in the last 72 hours. Hemoglobin A1C No results for input(s): HGBA1C in the last 72 hours. Fasting Lipid Panel No results for input(s): CHOL, HDL, LDLCALC, TRIG, CHOLHDL, LDLDIRECT in the last 72 hours. Thyroid Function Tests No results for input(s): TSH, T4TOTAL, T3FREE, THYROIDAB in the last 72 hours.  Invalid input(s): FREET3  Other results:   Imaging    CT CHEST WO CONTRAST  Result Date: 05/26/2020 CLINICAL  DATA:  Recent CABG in [place] procedure. Clinical concern of significant right pleural effusion. Chest drain placement requested. EXAM: CT CHEST WITHOUT CONTRAST TECHNIQUE: Multidetector CT imaging of the chest was performed following the standard protocol without IV contrast. COMPARISON:  05/16/2020 FINDINGS: Cardiovascular: Right arm PICC line to the proximal RA. Heart size upper limits normal. No pericardial effusion. Central pulmonary arteries normal in caliber. Interval left atrial clip placement. Coronary calcifications and changes of CABG. Aortic Atherosclerosis (ICD10-170.0). Mediastinum/Nodes: Gas in the anterior mediastinum presumably  postoperative. No mass or adenopathy. Lungs/Pleura: Interval decrease in pleural effusions with trace left and small right residual. Some worsening consolidation/atelectasis throughout much of the right lower lobe. No pneumothorax. Upper Abdomen: Trace perihepatic and perisplenic ascites, new since previous. Cholecystectomy clips. Musculoskeletal: Interval median sternotomy. Anterior vertebral endplate spurring at multiple levels in the mid and lower thoracic spine. IMPRESSION: 1. Interval CABG and left atrial clip placement. 2. Interval decrease in pleural effusions with trace left and small right residual, too small for chest tube placement. 3. Some worsening consolidation/atelectasis throughout much of the right lower lobe, likely accounting for the opacity seen on recent radiography. 4. Trace perihepatic and perisplenic ascites, new since previous. Aortic Atherosclerosis (ICD10-I70.0). Electronically Signed   By: Lucrezia Europe M.D.   On: 05/26/2020 13:47     Medications:     Scheduled Medications: . amiodarone  400 mg Oral BID  . apixaban  5 mg Oral BID  . aspirin EC  81 mg Oral Daily  . bisacodyl  10 mg Oral Daily   Or  . bisacodyl  10 mg Rectal Daily  . bisacodyl  10 mg Rectal Once  . Chlorhexidine Gluconate Cloth  6 each Topical Daily  . colchicine  0.6 mg Oral BID  . digoxin  0.125 mg Oral Daily  . docusate sodium  200 mg Oral Daily  . feeding supplement (ENSURE ENLIVE)  237 mL Oral BID BM  . insulin aspart  0-15 Units Subcutaneous Q4H  . latanoprost  1 drop Both Eyes QHS  . mouth rinse  15 mL Mouth Rinse BID  . metoCLOPramide (REGLAN) injection  10 mg Intravenous Q8H  . pantoprazole  40 mg Oral Daily  . potassium chloride  40 mEq Oral TID  . potassium chloride  40 mEq Oral Once  . rosuvastatin  20 mg Oral Daily  . sacubitril-valsartan  1 tablet Oral BID  . sodium chloride flush  10-40 mL Intracatheter Q12H  . sodium chloride flush  3 mL Intravenous Q12H  . spironolactone  25 mg  Oral Daily    Infusions: . sodium chloride      PRN Medications: levalbuterol, metoprolol tartrate, ondansetron (ZOFRAN) IV, oxyCODONE, perflutren lipid microspheres (DEFINITY) IV suspension, sodium chloride flush, sodium chloride flush, sodium chloride flush, sorbitol, traMADol    Assessment/Plan   1. CAD/NSTEMI with critcal LM disease - HS Trop [phone]. LHC with severe multivessel disease.  - S/p CABG 10/1 - ASA/Crestor 20 (myalgias with atorvastatin).  - no s/s ischemia  - friction rub improved with colchicine. No concerning findings on echo or chest CT.  - continue colchcine - d/w TCTS  2. Acute Systolic Heart Failure>>Cardiogenic Shock  - ECHO in 2020 showed EF 60-65%.  - ECHO this admit showed EF is down to 25-30%. RV normal.  Repeat echo post-op on 10/2 showed EF 25%, normal RV, no pericardial effusion.  - Ischemic CMP. Cath this admit w/ severe MVCAD w/ critical LM disease, s/p CABG as above.  - Remains  on milrinone 0.125 mcg. Co-ox improved to 71%%. However with AKI will continue at current rate today, try to wean off tomorrow.  - Creatinine up. Agree with stopping lasix.  - Continue Spiro to 25 daily  - Continue Entresto 24-26 bid - Continue digoxin 0.125 daily - Follow daily BMET   3. A fib RVR  - s/p MAZE w/ LAA Clip  - maintaining NSR.  - continue on amio gtt at 30 mg/hr while on milrinone  - continue Eliquis  5 bid  - no bleeding  4. H/o Prostate Cancer - coudet catheter in place due to massive prostate enlargement   - continue w/ coudet cath until fully diuresed   5. Hypokalemia - K 3.8 - watch closely with AKI  6. Post-op ileus - restart reglan - stop narcotics - ambulate  7. R pleural effusion - d/w Dr. Orvan Seen.  - CT shows small effusion. IR not able to place pigtail  8. AKI - hold diuretics. - follow    Length of Stay: 11  Glori Bickers, MD  10:11 AM

## 2020-05-27 NOTE — Plan of Care (Signed)
  Problem: Education: Goal: Knowledge of General Education information will improve Description: Including pain rating scale, medication(s)/side effects and non-pharmacologic comfort measures Outcome: Progressing   Problem: Health Behavior/Discharge Planning: Goal: Ability to manage health-related needs will improve Outcome: Progressing   Problem: Clinical Measurements: Goal: Ability to maintain clinical measurements within normal limits will improve Outcome: Progressing Goal: Will remain free from infection Outcome: Progressing Goal: Diagnostic test results will improve Outcome: Progressing Goal: Respiratory complications will improve Outcome: Progressing Goal: Cardiovascular complication will be avoided Outcome: Progressing   Problem: Activity: Goal: Risk for activity intolerance will decrease Outcome: Progressing   Problem: Nutrition: Goal: Adequate nutrition will be maintained Outcome: Progressing   Problem: Coping: Goal: Level of anxiety will decrease Outcome: Progressing   Problem: Elimination: Goal: Will not experience complications related to bowel motility Outcome: Progressing Goal: Will not experience complications related to urinary retention Outcome: Progressing   Problem: Pain Managment: Goal: General experience of comfort will improve Outcome: Progressing   Problem: Safety: Goal: Ability to remain free from injury will improve Outcome: Progressing   Problem: Skin Integrity: Goal: Risk for impaired skin integrity will decrease Outcome: Progressing   Problem: Education: Goal: Will demonstrate proper wound care and an understanding of methods to prevent future damage Outcome: Progressing Goal: Knowledge of disease or condition will improve Outcome: Progressing Goal: Knowledge of the prescribed therapeutic regimen will improve Outcome: Progressing Goal: Individualized Educational Video(s) Outcome: Progressing   Problem: Activity: Goal: Risk for  activity intolerance will decrease Outcome: Progressing   Problem: Cardiac: Goal: Will achieve and/or maintain hemodynamic stability Outcome: Progressing   Problem: Clinical Measurements: Goal: Postoperative complications will be avoided or minimized Outcome: Progressing   Problem: Respiratory: Goal: Respiratory status will improve Outcome: Progressing   Problem: Skin Integrity: Goal: Wound healing without signs and symptoms of infection Outcome: Progressing Goal: Risk for impaired skin integrity will decrease Outcome: Progressing   Problem: Urinary Elimination: Goal: Ability to achieve and maintain adequate renal perfusion and functioning will improve Outcome: Progressing   Problem: Activity: Goal: Ability to tolerate increased activity will improve Outcome: Progressing   Problem: Respiratory: Goal: Ability to maintain a clear airway and adequate ventilation will improve Outcome: Progressing   Problem: Role Relationship: Goal: Method of communication will improve Outcome: Progressing   

## 2020-05-27 NOTE — Progress Notes (Signed)
Pt flipping in and out of afib, so he was assessed. Pt milrinone drip stopped due to pt having low bps. Atkins, MD paged and an order for albumin was received as well as an amio bolus. Will continue to monitor.

## 2020-05-27 NOTE — Progress Notes (Signed)
9 Days Post-Op Procedure(s) (LRB): CORONARY ARTERY BYPASS GRAFTING (CABG) TIMES FOUR USING INTERNAL BILATERAL MAMMARY ARTERIES AND HARVESTED LEFT RADIAL ARTERY. (N/A) MAZE USING ATRICURE ISOLATOR CLAMP OLL2 (N/A) RADIAL ARTERY HARVEST (Left) TRANSESOPHAGEAL ECHOCARDIOGRAM (TEE) (N/A) INDOCYANINE GREEN FLUORESCENCE IMAGING (ICG) (N/A) CLIPPING OF ATRIAL APPENDAGE USING ATRICURE 45 MM ATRICLIP (Left) Subjective: Feeling much better  Objective: Vital signs in last 24 hours: Temp:  [97.9 F (36.6 C)-98.4 F (36.9 C)] 97.9 F (36.6 C) (10/10 0800) Pulse Rate:  [73-121] 73 (10/10 0800) Cardiac Rhythm: Normal sinus rhythm (10/10 0700) Resp:  [14-19] 19 (10/10 0800) BP: (60-157)/(42-99) 111/52 (10/10 0800) SpO2:  [94 %-97 %] 94 % (10/10 0300) Weight:  [115.2 kg] 115.2 kg (10/10 0537)  Hemodynamic parameters for last 24 hours: CVP:  [8 mmHg-9 mmHg] 8 mmHg  Intake/Output from previous day: 10/09 0701 - 10/10 0700 In: 452.7 [I.V.:452.7] Out: 975 [Urine:975] Intake/Output this shift: No intake/output data recorded.  General appearance: alert and cooperative Neurologic: intact Heart: regular rate and rhythm Lungs: diminished breath sounds RLL Abdomen: soft, non-tender; bowel sounds normal; no masses,  no organomegaly Extremities: edema mild Wound: c/d/i  Lab Results: Recent Labs    05/26/20 0355 05/27/20 0415  WBC 9.8 7.8  HGB 12.2* 10.3*  HCT 37.1* 30.3*  PLT 278 276   BMET:  Recent Labs    05/26/20 0355 05/27/20 0415  NA 130* 131*  K 3.3* 3.8  CL 87* 90*  CO2 31 29  GLUCOSE 134* 132*  BUN 16 31*  CREATININE 0.99 1.82*  CALCIUM 8.2* 7.7*    PT/INR: No results for input(s): LABPROT, INR in the last 72 hours. ABG    Component Value Date/Time   PHART 7.513 (H) 05/22/2020 0853   HCO3 32.1 (H) 05/22/2020 0853   TCO2 33 (H) 05/22/2020 0853   ACIDBASEDEF 1.0 05/19/2020 2131   O2SAT 71.1 05/27/2020 0415   CBG (last 3)  Recent Labs    05/27/20 0000  05/27/20 0340 05/27/20 0859  GLUCAP 123* 123* 147*    Assessment/Plan: S/P Procedure(s) (LRB): CORONARY ARTERY BYPASS GRAFTING (CABG) TIMES FOUR USING INTERNAL BILATERAL MAMMARY ARTERIES AND HARVESTED LEFT RADIAL ARTERY. (N/A) MAZE USING ATRICURE ISOLATOR CLAMP OLL2 (N/A) RADIAL ARTERY HARVEST (Left) TRANSESOPHAGEAL ECHOCARDIOGRAM (TEE) (N/A) INDOCYANINE GREEN FLUORESCENCE IMAGING (ICG) (N/A) CLIPPING OF ATRIAL APPENDAGE USING ATRICURE 45 MM ATRICLIP (Left) Mobilize hold diuresis as sCr doubled   LOS: 11 days    Wonda Olds 05/27/2020

## 2020-05-27 NOTE — Progress Notes (Signed)
  Echocardiogram 2D Echocardiogram limited with definity has been performed.  Darlina Sicilian M 05/27/2020, 10:03 AM

## 2020-05-28 DIAGNOSIS — I5021 Acute systolic (congestive) heart failure: Secondary | ICD-10-CM | POA: Diagnosis not present

## 2020-05-28 DIAGNOSIS — I214 Non-ST elevation (NSTEMI) myocardial infarction: Secondary | ICD-10-CM | POA: Diagnosis not present

## 2020-05-28 DIAGNOSIS — I4891 Unspecified atrial fibrillation: Secondary | ICD-10-CM | POA: Diagnosis not present

## 2020-05-28 DIAGNOSIS — J4520 Mild intermittent asthma, uncomplicated: Secondary | ICD-10-CM | POA: Diagnosis not present

## 2020-05-28 DIAGNOSIS — R9389 Abnormal findings on diagnostic imaging of other specified body structures: Secondary | ICD-10-CM

## 2020-05-28 DIAGNOSIS — Z951 Presence of aortocoronary bypass graft: Secondary | ICD-10-CM | POA: Diagnosis not present

## 2020-05-28 LAB — BASIC METABOLIC PANEL
Anion gap: 10 (ref 5–15)
BUN: 22 mg/dL (ref 8–23)
CO2: 28 mmol/L (ref 22–32)
Calcium: 8 mg/dL — ABNORMAL LOW (ref 8.9–10.3)
Chloride: 91 mmol/L — ABNORMAL LOW (ref 98–111)
Creatinine, Ser: 1.1 mg/dL (ref 0.61–1.24)
GFR, Estimated: >60 mL/min (ref >60)
Glucose, Bld: 128 mg/dL — ABNORMAL HIGH (ref 70–99)
Potassium: 4.3 mmol/L (ref 3.5–5.1)
Sodium: 129 mmol/L — ABNORMAL LOW (ref 135–145)

## 2020-05-28 LAB — GLUCOSE, CAPILLARY
Glucose-Capillary: 126 mg/dL — ABNORMAL HIGH (ref 70–99)
Glucose-Capillary: 142 mg/dL — ABNORMAL HIGH (ref 70–99)
Glucose-Capillary: 128 mg/dL — ABNORMAL HIGH (ref 70–99)
Glucose-Capillary: 124 mg/dL — ABNORMAL HIGH (ref 70–99)
Glucose-Capillary: 107 mg/dL — ABNORMAL HIGH (ref 70–99)
Glucose-Capillary: 115 mg/dL — ABNORMAL HIGH (ref 70–99)

## 2020-05-28 LAB — COOXEMETRY PANEL
Carboxyhemoglobin: 1.3 % (ref 0.5–1.5)
Methemoglobin: 0.8 % (ref 0.0–1.5)
O2 Saturation: 62.8 %
Total hemoglobin: 12.5 g/dL (ref 12.0–16.0)

## 2020-05-28 MED ORDER — FUROSEMIDE 40 MG PO TABS
40.0000 mg | ORAL_TABLET | Freq: Two times a day (BID) | ORAL | Status: DC
  Administered 2020-05-28 – 2020-05-30 (×5): 40 mg via ORAL
  Filled 2020-05-28 (×5): qty 1

## 2020-05-28 MED ORDER — MELATONIN 3 MG PO TABS
3.0000 mg | ORAL_TABLET | Freq: Every day | ORAL | Status: DC
  Administered 2020-05-28: 3 mg via ORAL
  Filled 2020-05-28: qty 1

## 2020-05-28 MED ORDER — ALFUZOSIN HCL ER 10 MG PO TB24
10.0000 mg | ORAL_TABLET | Freq: Every day | ORAL | Status: DC
  Administered 2020-05-28 – 2020-05-30 (×3): 10 mg via ORAL
  Filled 2020-05-28 (×3): qty 1

## 2020-05-28 MED ORDER — AMIODARONE HCL 200 MG PO TABS
200.0000 mg | ORAL_TABLET | Freq: Two times a day (BID) | ORAL | Status: DC
  Administered 2020-05-28 – 2020-05-30 (×5): 200 mg via ORAL
  Filled 2020-05-28 (×5): qty 1

## 2020-05-28 MED ORDER — FINASTERIDE 5 MG PO TABS
5.0000 mg | ORAL_TABLET | Freq: Every day | ORAL | Status: DC
  Administered 2020-05-28 – 2020-05-30 (×3): 5 mg via ORAL
  Filled 2020-05-28 (×3): qty 1

## 2020-05-28 MED ORDER — SODIUM CHLORIDE 0.9 % IV SOLN: 250.0000 mL | INTRAVENOUS | Status: DC

## 2020-05-28 MED ORDER — NOREPINEPHRINE 4 MG/250ML-% IV SOLN: 2.0000 ug/min | INTRAVENOUS | Status: DC

## 2020-05-28 MED ORDER — MONTELUKAST SODIUM 10 MG PO TABS
10.0000 mg | ORAL_TABLET | Freq: Every day | ORAL | Status: DC
  Administered 2020-05-28 – 2020-05-29 (×2): 10 mg via ORAL
  Filled 2020-05-28 (×2): qty 1

## 2020-05-28 NOTE — Progress Notes (Signed)
CARDIAC REHAB PHASE I   PRE:  Rate/Rhythm: 86 SR  BP:  Supine:   Sitting: 113/57     SaO2: 93% RA  MODE:  Ambulation: 290 ft   POST:  Rate/Rhythm: 94 SR  BP:  Supine:   Sitting: 121/68    SaO2: 94% RA  Pt was lying bed upon arrival. Pt needed cues on sternal precautions getting out of bed. Pt stated he had not walked since Saturday with Cardiac Rehab. Pt ambulated with front wheel walker x2 assist. Pt tolerated ambulation well and walked 218ft.Pt returned to bed. Needs reminders for sternal precautions and moving in the tube. Had long discussion with pt about how to be independent with moving and getting around at home. Suggest that pt gets home health PT. Pt would also benefit from [facility], pt has RW at home. Pt has wife and daughter at home to help.  [place], [place], CEP 05/28/2020 9:35 AM

## 2020-05-28 NOTE — Progress Notes (Signed)
   05/28/20 1400  Clinical Encounter Type  Visited With Patient and family together  Visit Type Follow-up  Referral From Nurse  Consult/Referral To [place]  The chaplain met with patient and his daughter [name]) to complete an AD. The chaplain reviewed the document and explained the portions of the document. The patient completed the form correctly and the chaplain contacted volunteer services and notary to have the document completed. Upon the completion of the AD the chaplain had the unit secretary Jearld Pies) to make serval copies and have a copy scanned into the patient's file.

## 2020-05-28 NOTE — Progress Notes (Addendum)
      [address]       [place] [address]             [phone]      10 Days Post-Op Procedure(s) (LRB): CORONARY ARTERY BYPASS GRAFTING (CABG) TIMES FOUR USING INTERNAL BILATERAL MAMMARY ARTERIES AND HARVESTED LEFT RADIAL ARTERY. (N/A) MAZE USING ATRICURE ISOLATOR CLAMP OLL2 (N/A) RADIAL ARTERY HARVEST (Left) TRANSESOPHAGEAL ECHOCARDIOGRAM (TEE) (N/A) INDOCYANINE GREEN FLUORESCENCE IMAGING (ICG) (N/A) CLIPPING OF ATRIAL APPENDAGE USING ATRICURE 45 MM ATRICLIP (Left) Subjective: Feels good this morning. He does not like the food here but otherwise doing okay  Objective: Vital signs in last 24 hours: Temp:  [97.5 F (36.4 C)-98.2 F (36.8 C)] 97.5 F (36.4 C) (10/11 0308) Pulse Rate:  [73-85] 84 (10/11 0308) Cardiac Rhythm: Normal sinus rhythm (10/11 0707) Resp:  [17-22] 19 (10/11 0308) BP: (87-135)/(50-106) 120/51 (10/11 0308) SpO2:  [91 %-95 %] 92 % (10/11 0308) Weight:  [111.6 kg] 111.6 kg (10/11 0436)  Hemodynamic parameters for last 24 hours: CVP:  [2 mmHg-5 mmHg] 3 mmHg  Intake/Output from previous day: 10/10 0701 - 10/11 0700 In: -  Out: 3400 [Urine:3400] Intake/Output this shift: No intake/output data recorded.  General appearance: alert, cooperative and no distress Heart: regular rate and rhythm, S1, S2 normal, no murmur, click, rub or gallop Lungs: clear to auscultation bilaterally Abdomen: soft, non-tender; bowel sounds normal; no masses,  no organomegaly Extremities: no edema, legs wrapped in ACE bandages Wound: clean and dry  Lab Results: Recent Labs    05/26/20 0355 05/27/20 0415  WBC 9.8 7.8  HGB 12.2* 10.3*  HCT 37.1* 30.3*  PLT 278 276   BMET:  Recent Labs    05/27/20 1635 05/28/20 0312  NA 130* 129*  K 3.5 4.3  CL 90* 91*  CO2 30 28  GLUCOSE 150* 128*  BUN 26* 22  CREATININE 1.38* 1.10  CALCIUM 8.0* 8.0*    PT/INR: No results for input(s): LABPROT, INR in the last 72 hours. ABG    Component Value  Date/Time   PHART 7.513 (H) 05/22/2020 0853   HCO3 32.1 (H) 05/22/2020 0853   TCO2 33 (H) 05/22/2020 0853   ACIDBASEDEF 1.0 05/19/2020 2131   O2SAT 62.8 05/28/2020 0312   CBG (last 3)  Recent Labs    05/27/20 2016 05/27/20 2344 05/28/20 0314  GLUCAP 116* 129* 126*    Assessment/Plan: S/P Procedure(s) (LRB): CORONARY ARTERY BYPASS GRAFTING (CABG) TIMES FOUR USING INTERNAL BILATERAL MAMMARY ARTERIES AND HARVESTED LEFT RADIAL ARTERY. (N/A) MAZE USING ATRICURE ISOLATOR CLAMP OLL2 (N/A) RADIAL ARTERY HARVEST (Left) TRANSESOPHAGEAL ECHOCARDIOGRAM (TEE) (N/A) INDOCYANINE GREEN FLUORESCENCE IMAGING (ICG) (N/A) CLIPPING OF ATRIAL APPENDAGE USING ATRICURE 45 MM ATRICLIP (Left)  1. NSR in the 80s, BP well controlled. Continue Eliquis, Amio, ASA, Digoxin, Entresto, and spironolactone. He remains on milrinone with a coox of 62.8% 2. Pulm- tolerating room air with good oxygen saturation. 3. Renal-creatinine 1.10, electrolytes. 4. H and H has been stable 5. Endo- blood glucose well controlled 6. BPH- will restart proscar and attempt foley removal  Plan: Turning off milrinone and hopefully home tomorrow. Appreciate HF help. Encouraged to ambulate, remove foley catheter.     LOS: 12 days    Elgie Collard 05/28/2020

## 2020-05-28 NOTE — Progress Notes (Signed)
Focus of session on educating pt in sternal precautions related to ADL and iADL and in energy conservation strategies. Supportive daughter at bedside. Pt declined ADL training with OT, had completed bathing and dressing early this morning with nursing staff.    05/28/20 1000  OT Visit Information  Last OT Received On 05/28/20  Assistance Needed +1  History of Present Illness pt is a 74 y/o male with h/o sinus brady, PVC's, HTN OSA, prostate CA, recent R TKA admitted with CP and SOB, found to be in afib with RVR.  Workup for elevated trop, CP and CHF.  Pt s/p CABGx4 10/1.  Precautions  Precautions Sternal;Fall  Precaution Comments reinforced sternal precautions during ADL and IADL  Pain Assessment  Pain Assessment No/denies pain  Cognition  Arousal/Alertness Awake/alert  Behavior During Therapy WFL for tasks assessed/performed  Overall Cognitive Status Within Functional Limits for tasks assessed  General Comments needs cues to generalize sternal precautions  ADL  General ADL Comments Educated at length in energy conservation strategies and provided written handout. Instructed in importance of ambulation throughout the day to bathroom, refridgerator, table to eat.  OT - End of Session  Activity Tolerance Patient limited by fatigue  Patient left in bed;with call bell/phone within reach;with family/visitor present  OT Assessment/Plan  OT Plan Discharge plan needs to be updated  OT Visit Diagnosis Unsteadiness on feet (R26.81);Other abnormalities of gait and mobility (R26.89);Pain;Muscle weakness (generalized) (M62.81)  OT Frequency (ACUTE ONLY) Min 2X/week  Follow Up Recommendations Supervision/Assistance - 24 hour;No OT follow up  OT Equipment 3 in 1 bedside commode  AM-PAC OT "6 Clicks" Daily Activity Outcome Measure (Version 2)  Help from another person eating meals? 4  Help from another person taking care of personal grooming? 3  Help from another person toileting, which includes using  toliet, bedpan, or urinal? 2  Help from another person bathing (including washing, rinsing, drying)? 2  Help from another person to put on and taking off regular upper body clothing? 3  Help from another person to put on and taking off regular lower body clothing? 2  6 Click Score 16  OT Goal Progression  Progress towards OT goals Progressing toward goals  Acute Rehab OT Goals  Patient Stated Goal home as soon as able  OT Goal Formulation With patient  Time For Goal Achievement 06/07/20  Potential to Achieve Goals Good  OT Time Calculation  OT Start Time (ACUTE ONLY) 1034  OT Stop Time (ACUTE ONLY) 1056  OT Time Calculation (min) 22 min  OT General Charges  $OT Visit 1 Visit  OT Treatments  $Self Care/Home Management  8-22 mins  Nestor Lewandowsky, OTR/L Acute Rehabilitation Services Pager: [phone] Office: [phone]

## 2020-05-28 NOTE — Consult Note (Signed)
NAME:  Eric Stokes, MRN:  [id number], DOB:  [birth year], LOS: 95 ADMISSION DATE:  05/16/2020, CONSULTATION DATE: 10/11 REFERRING MD:  Orvan Seen, CHIEF COMPLAINT:  Abnormal CXR   Brief History   74 year old male status post CABG/maze/LAA clipping on 10/1.  Supported in the intensive care initially requiring titration of preload/afterload/inotropic support.  Pulmonary asked to evaluate on 10/11 for abnormal chest x-ray and concern about possible right pleural effusion  History of present illness   This is a 74 year old male patient who was initially admitted on 9/29 with chief complaint of shortness of breath, new atrial fibrillation with RVR, and ST depression ultimately underwent cardiac catheterization where he was found to have multivessel coronary artery disease And subsequently underwent coronary artery bypass grafting with maze and LAA clip which was completed on 10/1.  His postoperative course: Required IABP in addition to inotropic support postoperatively.  IABP discontinued on 10/4, successfully extubated on 10/5.  From 10/5 through 10/7 supported in the intensive care with primary focus on advanced heart failure interventions including preload reduction, titration of inotropic support, and weaning of oxygen.  On 10/9 interventional radiology was consulted for possible chest tube placement he underwent CT evaluation with plan to assist with placement and this demonstrated only minimal trace bilateral effusions and therefore chest tube was not placed.  As of 10/11 pulmonary was consulted for abnormal chest x-ray, and to reevaluate chest via ultrasound to ensure no significant pleural effusion  Past Medical History  Hypertension, obstructive sleep apnea on CPAP, hyperlipidemia, history of prostate cancer.  Significant Hospital Events   CABG 10/1 Extubated 10/4 Interventional radiology consulted 10/9 for possible right sided chest tube to be placed under CT guidance.  CT was negative for  significant effusion 10/11 pulmonary asked to evaluate for possible chest tube placement  Consults:  Cardiology CVTS Pulmonary Procedures:    Significant Diagnostic Tests:  Bedside ultrasound of right chest: This was completed on 10/11 there is no significant pleural effusion Micro Data:    Antimicrobials:     Interim history/subjective:  Feeling better, appetite is poor  Objective   Blood pressure 102/75, pulse 85, temperature 98 F (36.7 C), temperature source Oral, resp. rate 15, height 5\' 10"  (1.778 m), weight 111.6 kg, SpO2 93 %. CVP:  [2 mmHg-3 mmHg] 3 mmHg      Intake/Output Summary (Last 24 hours) at 05/28/2020 1010 Last data filed at 05/28/2020 0837 Gross per 24 hour  Intake no documentation  Output 3401 ml  Net -3401 ml   Filed Weights   05/26/20 0600 05/27/20 0537 05/28/20 0436  Weight: 114.3 kg 115.2 kg 111.6 kg    Examination: General: 75 year old white male sitting in bed he is in no acute distress this morning HENT: Normocephalic atraumatic no jugular venous distention Lungs: Diminished on the right, no accessory use, currently 2 L/min.  Ultrasound as mentioned above with no significant pleural effusion on the right Cardiovascular: Regular rate and rhythm sternal staples intact Abdomen: Soft nontender Extremities: Warm dry trace lower extremity Neuro: Edema awake oriented no focal deficits GU: voids  Resolved Hospital Problem list     Assessment & Plan:  Multivessel coronary artery disease now status post CABG/Maze procedure and LAA clipping Systolic heart failure with postoperative cardiogenic shock: EF initially 25 to 30%, postsurgical EF on 10/10 now showing improved EF of 40 to 45% with normal RV function Atrial fibrillation with RVR Postoperative ileus Postoperative atelectasis Small bilateral pleural effusions   pulmonary problem list: Postoperative atelectasis Small Pleural  effusions.   History of obstructive sleep  apnea  Discussion Bedside ultrasound was performed.  This verified what was found on CT imaging on 10/9 showing only minimal right-sided pleural effusion, with volume too small for sampling.  Would favor ongoing postoperative atelectasis as primary etiology of abnormal chest imaging  Plan/recommendations Continue to mobilize Push incentive spirometry Wean oxygen Resume Singulair Continue as needed nebs  Best practice:  Per primary  Labs   CBC: Recent Labs  Lab 05/23/20 0441 05/24/20 0347 05/25/20 0338 05/26/20 0355 05/27/20 0415  WBC 4.7 4.9 5.7 9.8 7.8  HGB 10.7* 10.9* 10.6* 12.2* 10.3*  HCT 31.5* 33.2* 33.0* 37.1* 30.3*  MCV 86.3 86.2 87.5 85.3 85.6  PLT 110* 138* 178 278 268    Basic Metabolic Panel: Recent Labs  Lab 05/22/20 0500 05/22/20 0853 05/25/20 0338 05/26/20 0355 05/27/20 0415 05/27/20 1635 05/28/20 0312  NA 134*   < > 133* 130* 131* 130* 129*  K 3.7   < > 3.4* 3.3* 3.8 3.5 4.3  CL 96*   < > 91* 87* 90* 90* 91*  CO2 28   < > 32 31 29 30 28   GLUCOSE 145*   < > 121* 134* 132* 150* 128*  BUN 11   < > 16 16 31* 26* 22  CREATININE 0.85   < > 0.86 0.99 1.82* 1.38* 1.10  CALCIUM 7.8*   < > 8.0* 8.2* 7.7* 8.0* 8.0*  MG 2.0  --   --  1.9  --   --   --    < > = values in this interval not displayed.   GFR: Estimated Creatinine Clearance: 73.7 mL/min (by C-G formula based on SCr of 1.1 mg/dL). Recent Labs  Lab 05/24/20 0347 05/25/20 0338 05/26/20 0355 05/27/20 0415  WBC 4.9 5.7 9.8 7.8    Liver Function Tests: Recent Labs  Lab 05/27/20 1635  AST 28  ALT 30  ALKPHOS 50  BILITOT 0.8  PROT 5.5*  ALBUMIN 2.7*   No results for input(s): LIPASE, AMYLASE in the last 168 hours. No results for input(s): AMMONIA in the last 168 hours.  ABG    Component Value Date/Time   PHART 7.513 (H) 05/22/2020 0853   PCO2ART 40.1 05/22/2020 0853   PO2ART 104 05/22/2020 0853   HCO3 32.1 (H) 05/22/2020 0853   TCO2 33 (H) 05/22/2020 0853   ACIDBASEDEF 1.0  05/19/2020 2131   O2SAT 62.8 05/28/2020 0312     Coagulation Profile: No results for input(s): INR, PROTIME in the last 168 hours.  Cardiac Enzymes: No results for input(s): CKTOTAL, CKMB, CKMBINDEX, TROPONINI in the last 168 hours.  HbA1C: Hgb A1c MFr Bld  Date/Time Value Ref Range Status  05/18/2020 02:20 AM 5.3 4.8 - 5.6 % Final    Comment:    (NOTE) Pre diabetes:          5.7%-6.4%  Diabetes:              >6.4%  Glycemic control for   <7.0% adults with diabetes   05/17/2020 08:00 PM 5.3 4.8 - 5.6 % Final    Comment:    (NOTE) Pre diabetes:          5.7%-6.4%  Diabetes:              >6.4%  Glycemic control for   <7.0% adults with diabetes     CBG: Recent Labs  Lab 05/27/20 1612 05/27/20 2016 05/27/20 2344 05/28/20 0314 05/28/20 0833  GLUCAP 115* 116*  129* 126* 142*    Review of Systems:   Review of Systems  Constitutional: Negative.   HENT: Negative.   Eyes: Negative.   Respiratory: Positive for shortness of breath.   Cardiovascular: Negative.   Gastrointestinal: Negative.   Genitourinary: Negative.   Skin: Negative.   Neurological: Negative.   Endo/Heme/Allergies: Negative.   Psychiatric/Behavioral: Negative.      Past Medical History  He,  has a past medical history of Aortic stenosis, mild, Asthma, Benign essential hypertension, on Norvasc and Amlodipine (12/21/2008), BPH (benign prostatic hyperplasia) (09/03/2012), Carotid bruit present (06/15/2018), Colonic polyp (01/09/2020), COPD (chronic obstructive pulmonary disease) ([place]), Dyslipidemia (12/21/2008), Essential hypertension, benign (12/21/2008), Former smoker, stopped smoking in distant past (03/15/2018), Hematuria (09/16/2011), History of migraine, OA (osteoarthritis), Obese, OSA on CPAP (09/25/2015), Prostate cancer ([place]) (11/30/2019), PVC's (premature ventricular contractions), Seasonal allergies, Sinus bradycardia, Squamous cell carcinoma of skin, TIA (transient ischemic attack), Vitamin B 12  deficiency, and Vitamin D deficiency (11/26/2010).   Surgical History    Past Surgical History:  Procedure Laterality Date  . CHOLECYSTECTOMY  2001  . CLIPPING OF ATRIAL APPENDAGE Left 05/18/2020   Procedure: CLIPPING OF ATRIAL APPENDAGE USING ATRICURE 28 MM ATRICLIP;  Surgeon: Wonda Olds, MD;  Location: [place];  Service: Open Heart Surgery;  Laterality: Left;  . COLONOSCOPY    . CORONARY ARTERY BYPASS GRAFT N/A 05/18/2020   Procedure: CORONARY ARTERY BYPASS GRAFTING (CABG) TIMES FOUR USING INTERNAL BILATERAL MAMMARY ARTERIES AND HARVESTED LEFT RADIAL ARTERY.;  Surgeon: Wonda Olds, MD;  Location: [place];  Service: Open Heart Surgery;  Laterality: N/A;  CORONARY ENDARTERECTOMY PERFORMED DURING SURGERY   . IABP INSERTION N/A 05/17/2020   Procedure: IABP INSERTION;  Surgeon: Jolaine Artist, MD;  Location: [place] [facility];  Service: Cardiovascular;  Laterality: N/A;  . KNEE ARTHROPLASTY Right 03/14/2020   Procedure: COMPUTER ASSISTED TOTAL KNEE ARTHROPLASTY;  Surgeon: Rod Can, MD;  Location: [facility];  Service: Orthopedics;  Laterality: Right;  Marland Kitchen MAZE N/A 05/18/2020   Procedure: MAZE USING ATRICURE ISOLATOR CLAMP OLL2;  Surgeon: Wonda Olds, MD;  Location: [place];  Service: Open Heart Surgery;  Laterality: N/A;  . RADIAL ARTERY HARVEST Left 05/18/2020   Procedure: RADIAL ARTERY HARVEST;  Surgeon: Wonda Olds, MD;  Location: [place];  Service: Open Heart Surgery;  Laterality: Left;  . RIGHT HEART CATH N/A 05/17/2020   Procedure: RIGHT HEART CATH;  Surgeon: Jolaine Artist, MD;  Location: [place] [facility];  Service: Cardiovascular;  Laterality: N/A;  . RIGHT/LEFT HEART CATH AND CORONARY ANGIOGRAPHY N/A 05/17/2020   Procedure: RIGHT/LEFT HEART CATH AND CORONARY ANGIOGRAPHY;  Surgeon: Lorretta Harp, MD;  Location: [place] [facility];  Service: Cardiovascular;  Laterality: N/A;  . TEE WITHOUT CARDIOVERSION N/A 05/18/2020   Procedure: TRANSESOPHAGEAL ECHOCARDIOGRAM  (TEE);  Surgeon: Wonda Olds, MD;  Location: [place];  Service: Open Heart Surgery;  Laterality: N/A;  . [place]     Social History   reports that he quit smoking about 54 years ago. His smoking use included cigarettes. He has never used smokeless tobacco. He reports current alcohol use of about 2.0 - 6.0 standard drinks of alcohol per week. He reports that he does not use drugs.   Family History   His family history includes COPD in his father; Diabetes in his mother; Stroke in his paternal grandfather.   Allergies Allergies  Allergen Reactions  . Tamsulosin Other (See Comments)    dizziness  .  Cipro [Ciprofloxacin Hcl] Swelling    Knee swelling, joint pain  . Lipitor [Atorvastatin] Other (See Comments)    Leg Cramps  . Lisinopril Cough     Home Medications  Prior to Admission medications   Medication Sig Start Date End Date Taking? Authorizing Provider  acetaminophen (TYLENOL) 500 MG tablet Take 1,000 mg by mouth at bedtime.   Yes [provider]  albuterol (VENTOLIN HFA) 108 (90 Base) MCG/ACT inhaler Inhale 2 puffs into the lungs every 6 (six) hours as needed for wheezing or shortness of breath. 05/04/20  Yes Leamon Arnt, MD  alfuzosin (UROXATRAL) 10 MG 24 hr tablet Take 5 mg by mouth in the morning and at bedtime.   Yes [provider]  carboxymethylcellulose (REFRESH PLUS) 0.5 % SOLN Place 1 drop into both eyes 2 (two) times daily as needed (dry eyes).   Yes [provider]  cetirizine (ZYRTEC) 10 MG tablet Take 10 mg by mouth daily. 01/16/13  Yes [provider]  Cholecalciferol (VITAMIN D3) 2000 UNITS TABS Take 2,000 Int'l Units by mouth daily.    Yes [provider]  finasteride (PROSCAR) 5 MG tablet Take 5 mg by mouth daily. 12/02/19  Yes [provider]  fluticasone (FLONASE) 50 MCG/ACT nasal spray USE TWO SPRAYS IN EACH NOSTRIL DAILY AS NEEDED. Patient taking differently: Place 2 sprays  into both nostrils daily as needed for allergies.  05/02/20  Yes Leamon Arnt, MD  ibuprofen (ADVIL) 400 MG tablet Take 400 mg by mouth every 6 (six) hours as needed for moderate pain.    Yes [provider]  latanoprost (XALATAN) 0.005 % ophthalmic solution INSTILL 1 DROP INTO BOTH EYES AT BEDTIME Patient taking differently: Place 1 drop into both eyes daily.  05/02/20  Yes Leamon Arnt, MD  NON FORMULARY Cpap at night   Yes [provider]  pravastatin (PRAVACHOL) 20 MG tablet Take 20 mg by mouth at bedtime.  03/05/20  Yes [provider]  apixaban (ELIQUIS) 2.5 MG TABS tablet Take 1 tablet (2.5 mg total) by mouth 2 (two) times daily. 03/15/20 04/14/20  Cherlynn June B, PA  losartan (COZAAR) 25 MG tablet TAKE 1 TABLET BY MOUTH EVERY DAY 05/27/20   Leamon Arnt, MD  montelukast (SINGULAIR) 10 MG tablet TAKE 1 TABLET BY MOUTH EVERY DAY AT BEDTIME 05/27/20   Leamon Arnt, MD     Critical care time: NA    Erick Colace ACNP-BC [place] Pager # [phone] OR # [phone] if no answer

## 2020-05-28 NOTE — Progress Notes (Addendum)
Patient ID: Eric Stokes, male   DOB: Aug 11, 1946, 74 y.o.   MRN: [id number] P    Advanced Heart Failure Rounding Note  PCP-Cardiologist: No primary care provider on file.   Subjective:    Taken to OR 10/1 for CABG + MAZE and LAA clip.   Remains on milrinone 0.125 mcg + lasix drip 6 mg per hour. Co-ox 63%.  Yesterday given dose of metolazone. Brisk diuresis noted. Weight down another 7 pounds.   05/27/20 Limited ECHO showed EF 40-45% normal RV. Grade II DD  Feeling much better today. Denies SOB.    Objective:   Weight Range: 111.6 kg Body mass index is 35.3 kg/m.   Vital Signs:   Temp:  [97.5 F (36.4 C)-98.2 F (36.8 C)] 97.5 F (36.4 C) (10/11 0308) Pulse Rate:  [73-85] 84 (10/11 0308) Resp:  [17-22] 19 (10/11 0308) BP: (87-135)/(50-106) 120/51 (10/11 0308) SpO2:  [91 %-95 %] 92 % (10/11 0308) Weight:  [111.6 kg] 111.6 kg (10/11 0436) Last BM Date: 05/27/20  Weight change: Filed Weights   05/26/20 0600 05/27/20 0537 05/28/20 0436  Weight: 114.3 kg 115.2 kg 111.6 kg    Intake/Output:   Intake/Output Summary (Last 24 hours) at 05/28/2020 0754 Last data filed at 05/28/2020 0647 Gross per 24 hour  Intake --  Output 3400 ml  Net -3400 ml      Physical Exam  CVP 3  General:  No resp difficulty HEENT: normal Neck: supple. no JVD. Carotids 2+ bilat; no bruits. No lymphadenopathy or thryomegaly appreciated. Cor: PMI nondisplaced. Regular rate & rhythm. No rubs, gallops or murmurs. Lungs: clear Abdomen: soft, nontender, nondistended. No hepatosplenomegaly. No bruits or masses. Good bowel sounds. Extremities: no cyanosis, clubbing, rash, edema + unna boots Neuro: alert & orientedx3, cranial nerves grossly intact. moves all 4 extremities w/o difficulty. Affect pleasant   Telemetry   SR 60-70s   Labs    CBC Recent Labs    05/26/20 0355 05/27/20 0415  WBC 9.8 7.8  HGB 12.2* 10.3*  HCT 37.1* 30.3*  MCV 85.3 85.6  PLT 278 546   Basic Metabolic  Panel Recent Labs    05/26/20 0355 05/27/20 0415 05/27/20 1635 05/28/20 0312  NA 130*   < > 130* 129*  K 3.3*   < > 3.5 4.3  CL 87*   < > 90* 91*  CO2 31   < > 30 28  GLUCOSE 134*   < > 150* 128*  BUN 16   < > 26* 22  CREATININE 0.99   < > 1.38* 1.10  CALCIUM 8.2*   < > 8.0* 8.0*  MG 1.9  --   --   --    < > = values in this interval not displayed.   Liver Function Tests Recent Labs    05/27/20 1635  AST 28  ALT 30  ALKPHOS 50  BILITOT 0.8  PROT 5.5*  ALBUMIN 2.7*   No results for input(s): LIPASE, AMYLASE in the last 72 hours. Cardiac Enzymes No results for input(s): CKTOTAL, CKMB, CKMBINDEX, TROPONINI in the last 72 hours.  BNP: BNP (last 3 results) Recent Labs    05/16/20 1155  BNP 415.7*    ProBNP (last 3 results) No results for input(s): PROBNP in the last 8760 hours.   D-Dimer No results for input(s): DDIMER in the last 72 hours. Hemoglobin A1C No results for input(s): HGBA1C in the last 72 hours. Fasting Lipid Panel No results for input(s): CHOL, HDL, LDLCALC, TRIG,  CHOLHDL, LDLDIRECT in the last 72 hours. Thyroid Function Tests No results for input(s): TSH, T4TOTAL, T3FREE, THYROIDAB in the last 72 hours.  Invalid input(s): FREET3  Other results:   Imaging    ECHOCARDIOGRAM LIMITED  Result Date: 05/27/2020    ECHOCARDIOGRAM LIMITED REPORT   Patient Name:   Eric Stokes Date of Exam: 05/27/2020 Medical Rec #:  [id number]          Height:       70.0 in Accession #:    [id number]         Weight:       254.0 lb Date of Birth:  Jul 24, 1946         BSA:          2.310 m Patient Age:    59 years           BP:           111/52 mmHg Patient Gender: M                  HR:           73 bpm. Exam Location:  Inpatient Procedure: Limited Echo, Intracardiac Opacification Agent, Limited Color Doppler            and Cardiac Doppler Indications:    CAD Native Vessel 414.01 / I25.10  History:        Patient has prior history of Echocardiogram examinations,  most                 recent 05/19/2020. CHF, CAD and Previous Myocardial Infarction,                 Prior CABG, COPD and TIA, Aortic Valve Disease, Arrythmias:PVC;                 Risk Factors:Former Smoker, Hypertension and Dyslipidemia.                 History of cancer and Afib.  Sonographer:    Darlina Sicilian RDCS Referring Phys: [id number] Amore Grater R Debbi Strandberg IMPRESSIONS  1. Septal hypokinesis and dyskinesis. Basal to mid inferior hypokinesis.. Left ventricular ejection fraction, by estimation, is 40 to 45%. The left ventricle has mildly decreased function. The left ventricle demonstrates regional wall motion abnormalities (see scoring diagram/findings for description). There is moderate concentric left ventricular hypertrophy. Left ventricular diastolic parameters are consistent with Grade II diastolic dysfunction (pseudonormalization).  2. Trivial mitral valve regurgitation.  3. The aortic valve is calcified. There is moderate calcification of the aortic valve. There is moderate thickening of the aortic valve. Aortic valve regurgitation is mild. Mild aortic valve stenosis.  4. Aortic dilatation noted. There is mild dilatation of the ascending aorta, measuring 40 mm.  5. There is normal pulmonary artery systolic pressure. Comparison(s): Changes from prior study are noted. Compared with echo 69/4/85, systolic function has improved. FINDINGS  Left Ventricle: Septal hypokinesis and dyskinesis. Basal to mid inferior hypokinesis. Left ventricular ejection fraction, by estimation, is 40 to 45%. The left ventricle has mildly decreased function. The left ventricle demonstrates regional wall motion  abnormalities. Definity contrast agent was given IV to delineate the left ventricular endocardial borders. There is moderate concentric left ventricular hypertrophy. Left ventricular diastolic parameters are consistent with Grade II diastolic dysfunction (pseudonormalization). Indeterminate filling pressures. Right Ventricle:  There is normal pulmonary artery systolic pressure. The tricuspid regurgitant velocity is 2.47 m/s, and with an assumed right atrial pressure of 3 mmHg, the estimated right ventricular  systolic pressure is 13.0 mmHg. Mitral Valve: Trivial mitral valve regurgitation. Tricuspid Valve: Tricuspid valve regurgitation is mild. Aortic Valve: The aortic valve is calcified. There is moderate calcification of the aortic valve. There is moderate thickening of the aortic valve. Aortic valve regurgitation is mild. Mild aortic stenosis is present. Aortic valve mean gradient measures 11.4 mmHg. Aortic valve peak gradient measures 18.4 mmHg. Pulmonic Valve: Pulmonic valve regurgitation is mild to moderate. Aorta: Aortic dilatation noted. There is mild dilatation of the ascending aorta, measuring 40 mm. LEFT VENTRICLE PLAX 2D LVIDd:         5.50 cm      Diastology LVIDs:         4.20 cm      LV e' medial:    5.98 cm/s LV PW:         1.50 cm      LV E/e' medial:  12.1 LV IVS:        1.60 cm      LV e' lateral:   10.90 cm/s LVOT diam:     2.60 cm      LV E/e' lateral: 6.6 LV SV:         85 LV SV Index:   37 LVOT Area:     5.31 cm  LV Volumes (MOD) LV vol d, MOD A2C: 191.0 ml LV vol d, MOD A4C: 196.0 ml LV vol s, MOD A2C: 97.6 ml LV vol s, MOD A4C: 116.0 ml LV SV MOD A2C:     93.4 ml LV SV MOD A4C:     196.0 ml LV SV MOD BP:      87.3 ml LEFT ATRIUM         Index LA diam:    4.30 cm 1.86 cm/m  AORTIC VALVE AV Area (Vmax): 2.46 cm AV Vmax:        214.50 cm/s AV Peak Grad:   18.4 mmHg AV Mean Grad:   11.4 mmHg LVOT Vmax:      99.20 cm/s LVOT Vmean:     68.100 cm/s LVOT VTI:       0.161 m  AORTA Ao Root diam: 3.30 cm Ao Asc diam:  4.00 cm MITRAL VALVE               TRICUSPID VALVE MV Area (PHT): 4.06 cm    TR Peak grad:   24.4 mmHg MV Decel Time: 187 msec    TR Vmax:        247.00 cm/s MV E velocity: 72.40 cm/s MV A velocity: 60.00 cm/s  SHUNTS MV E/A ratio:  1.21        Systemic VTI:  0.16 m                            Systemic Diam:  2.60 cm Skeet Latch MD Electronically signed by Skeet Latch MD Signature Date/Time: 05/27/2020/1:35:11 PM    Final      Medications:     Scheduled Medications: . amiodarone  400 mg Oral BID  . apixaban  5 mg Oral BID  . aspirin EC  81 mg Oral Daily  . bisacodyl  10 mg Oral Daily   Or  . bisacodyl  10 mg Rectal Daily  . bisacodyl  10 mg Rectal Once  . Chlorhexidine Gluconate Cloth  6 each Topical Daily  . colchicine  0.6 mg Oral BID  . digoxin  0.125 mg Oral Daily  . docusate sodium  200 mg Oral Daily  . feeding supplement (ENSURE ENLIVE)  237 mL Oral BID BM  . insulin aspart  0-15 Units Subcutaneous Q4H  . latanoprost  1 drop Both Eyes QHS  . mouth rinse  15 mL Mouth Rinse BID  . pantoprazole  40 mg Oral Daily  . potassium chloride  40 mEq Oral TID  . potassium chloride  40 mEq Oral Once  . rosuvastatin  20 mg Oral Daily  . sacubitril-valsartan  1 tablet Oral BID  . sodium chloride flush  10-40 mL Intracatheter Q12H  . sodium chloride flush  3 mL Intravenous Q12H  . spironolactone  25 mg Oral Daily    Infusions: . sodium chloride    . milrinone 0.125 mcg/kg/min (05/28/20 0727)    PRN Medications: levalbuterol, metoprolol tartrate, ondansetron (ZOFRAN) IV, oxyCODONE, sodium chloride flush, sodium chloride flush, sodium chloride flush, sorbitol, traMADol    Assessment/Plan   1. CAD/NSTEMI with critcal LM disease - HS Trop [phone]. LHC with severe multivessel disease.  - S/p CABG 10/1 - ASA/Crestor 20 (myalgias with atorvastatin).  - Placed back on colchicine 10/10.   2. Acute Systolic Heart Failure>>Cardiogenic Shock  - ECHO in 2020 showed EF 60-65%.  - ECHO this admit showed EF is down to 25-30%. RV normal.  Repeat echo post-op on 10/2 showed EF 25%, normal RV, no pericardial effusion.  -Limited ECHO repeated 10/10 and showed EF improved 40-45% normal RV.  - Ischemic CMP.  - Remains on milrinone 0.125 mcg. Co-ox stable at 63%. Stop  milrinone. - Volume status improving. CVP < 5. Stop lasix drip. Start po lasix 40 mg twice a day.  - Continue Spiro to 25 daily  - Continue Entresto 24-26 bid - Stop dig with EF 40-45%.  -  Renal function improved  3. A fib RVR  - s/p MAZE w/ LAA Clip  - maintaining NSR.  - Stopping milrinone. Stop amio. Start amio 200 mg po twice a day.  - continue Eliquis  5 bid  - no bleeding issues  4. H/o Prostate Cancer - coudet catheter in place due to massive prostate enlargement   - continue w/ coudet cath until fully diuresed   5. Hypokalemia - Stable.   6. Post-op ileus - Continue reglan - stop narcotics - ambulate  7. R pleural effusion -CT chest showed small effusion to small for chest tube.    Length of Stay: [place], NP  7:54 AM   Patient seen and examined with the above-signed Advanced Practice Provider and/or Housestaff. I personally reviewed laboratory data, imaging studies and relevant notes. I independently examined the patient and formulated the important aspects of the plan. I have edited the note to reflect any of my changes or salient points. I have personally discussed the plan with the patient and/or family.  Feels better today except he wasn't able to sleep much. On milrinone 0.125. Co-ox ok. CVP down. Weight 5 pounds from baseline. Remains in NSR.  Creatinine improved after hold diuretics.   General:  Sitting up in bed No resp difficulty HEENT: normal Neck: supple. no JVD. Carotids 2+ bilat; no bruits. No lymphadenopathy or thryomegaly appreciated. Cor: PMI nondisplaced. Sternal wound ok.  Regular rate & rhythm.soft rub Lungs: coarse Abdomen: obese soft, nontender, nondistended. No hepatosplenomegaly. No bruits or masses. Good bowel sounds. Extremities: no cyanosis, clubbing, rash, tr edema + UNNA Neuro: alert & orientedx3, cranial nerves grossly intact. moves all 4 extremities w/o difficulty. Affect pleasant  Agree with  stopping milrinone and IV  amio. Switch to po amio. Continue Eliquis. Mobilize. Continue colchicine for friction rub. D/w TCTS.   Glori Bickers, MD  8:43 AM

## 2020-05-29 DIAGNOSIS — I214 Non-ST elevation (NSTEMI) myocardial infarction: Secondary | ICD-10-CM | POA: Diagnosis not present

## 2020-05-29 DIAGNOSIS — I4891 Unspecified atrial fibrillation: Secondary | ICD-10-CM | POA: Diagnosis not present

## 2020-05-29 LAB — COOXEMETRY PANEL
Carboxyhemoglobin: 1.3 % (ref 0.5–1.5)
Methemoglobin: 0.7 % (ref 0.0–1.5)
O2 Saturation: 73.8 %
Total hemoglobin: 13.3 g/dL (ref 12.0–16.0)

## 2020-05-29 LAB — GLUCOSE, CAPILLARY
Glucose-Capillary: 116 mg/dL — ABNORMAL HIGH (ref 70–99)
Glucose-Capillary: 115 mg/dL — ABNORMAL HIGH (ref 70–99)
Glucose-Capillary: 121 mg/dL — ABNORMAL HIGH (ref 70–99)

## 2020-05-29 MED ORDER — ZOLPIDEM TARTRATE 5 MG PO TABS
5.0000 mg | ORAL_TABLET | Freq: Every evening | ORAL | Status: DC | PRN
  Administered 2020-05-29: 5 mg via ORAL
  Filled 2020-05-29: qty 1

## 2020-05-29 MED ORDER — GERHARDT'S BUTT CREAM
1.0000 "application " | TOPICAL_CREAM | CUTANEOUS | Status: DC | PRN
  Filled 2020-05-29: qty 1

## 2020-05-29 NOTE — Research (Signed)
TRAC-AF Registry Informed Consent   Subject Name: Eric Stokes  Subject met inclusion and exclusion criteria.  The informed consent form, registry requirements and expectations were reviewed with the subject and questions and concerns were addressed prior to the signing of the consent form.  The subject verbalized understanding of the registry requirements.  The subject agreed to participate in the [place] registry and signed the informed consent.  The informed consent was obtained prior to performance of any protocol-specific procedures for the subject.  A copy of the signed informed consent was given to the subject and a copy was placed in the subject's medical record.  [name] 05/29/2020, 10:23 AM

## 2020-05-29 NOTE — Plan of Care (Signed)
  Problem: Education: Goal: Knowledge of General Education information will improve Description: Including pain rating scale, medication(s)/side effects and non-pharmacologic comfort measures Outcome: Progressing   Problem: Health Behavior/Discharge Planning: Goal: Ability to manage health-related needs will improve Outcome: Progressing   Problem: Clinical Measurements: Goal: Ability to maintain clinical measurements within normal limits will improve Outcome: Progressing Goal: Respiratory complications will improve Outcome: Progressing   Problem: Activity: Goal: Risk for activity intolerance will decrease Outcome: Progressing   Problem: Nutrition: Goal: Adequate nutrition will be maintained Outcome: Progressing   Problem: Coping: Goal: Level of anxiety will decrease Outcome: Progressing   Problem: Pain Managment: Goal: General experience of comfort will improve Outcome: Progressing

## 2020-05-29 NOTE — Consult Note (Signed)
WOC Nurse Consult Note: Reason for Consult: Left lateral heel with concern for PI, blister (Stage 2) has deflated and is near-resolution with floatation of heels overnight. Wound type: Pressure Pressure Injury POA: No Measurement: 0.4cm x 0.5cm area without depth, deflated blister in process of reabsorption. Wound bed: N/A Drainage (amount, consistency, odor) N/A Periwound: intact, mild erythema Dressing procedure/placement/frequency: I removed the bilateral Unna's Boots applied yesterday and assessed bilateral heels. (Consulted for right heel, patient described discomfort at left lateral heel.) Left lateral heel will be treated with a silicone foam dressing prior to Unna's boot applications twice weekly on Mondays and Thursdays.  Heels are to be placed into Prevalon Boots for PI prevention. Additionally a silicone foam prophylactic dressing is to be placed to the sacrum for PI prevention.    [place] nursing team will not follow, but will remain available to this patient, the nursing and medical teams.  Please re-consult if needed. Thanks, Maudie Flakes, MSN, RN, [place], Arther Abbott  Pager# [phone]

## 2020-05-29 NOTE — Care Management Important Message (Signed)
Important Message  Patient Details  Name: Eric Stokes MRN: [id number] Date of Birth: 1946-08-06   Medicare Important Message Given:  Yes     Orbie Pyo 05/29/2020, 2:32 PM

## 2020-05-29 NOTE — Progress Notes (Signed)
Orthopedic Tech Progress Note Patient Details:  KENTREL CLEVENGER 21-Jan-1946 [id number] I forgot to chart on yesterday for UNNA BOOTS. Patient daughter stated " dad has a tender spot on LLE/ HEEL" told RN.. this morning I was called back to reapply a new pair of UNNA BOOTS after "WOC" took off and made sure heel was good.  Ortho Devices Type of Ortho Device: Haematologist Ortho Device/Splint Location: BLE Ortho Device/Splint Interventions: Ordered, Application   Post Interventions Patient Tolerated: Well Instructions Provided: Care of device, Poper ambulation with device   Janit Pagan 05/29/2020, 1:05 PM

## 2020-05-29 NOTE — Progress Notes (Signed)
Physical Therapy Treatment Patient Details Name: Eric Stokes MRN: [id number] DOB: [birth year] Today's Date: 05/29/2020    History of Present Illness pt is a 74 y/o male with h/o sinus brady, PVC's, HTN OSA, prostate CA, recent R TKA admitted with CP and SOB, found to be in afib with RVR.  Workup for elevated trop, CP and CHF.  Pt s/p CABGx4 10/1.    PT Comments    Pt supine and eager to mobilize and return home. Pt concerned that he has not been as active with his knee as he would be at home. Pt educated for HEP, performed and educated for transfers and gait following precautions. Pt appropriate for return home.   Hr 84-100   Follow Up Recommendations  Home health PT;Supervision/Assistance - 24 hour     Equipment Recommendations  None recommended by PT    Recommendations for Other Services       Precautions / Restrictions Precautions Precautions: Sternal;Fall    Mobility  Bed Mobility Overal bed mobility: Needs Assistance Bed Mobility: Supine to Sit     Supine to sit: Min guard     General bed mobility comments: guarding with pt able to transition legs off of bed then rotate trunk to rise with tactile cues  Transfers Overall transfer level: Needs assistance   Transfers: Sit to/from Stand Sit to Stand: Min guard         General transfer comment: cues for hand placement with tactile cues to rise from slightly elevated bed  Ambulation/Gait Ambulation/Gait assistance: Supervision Gait Distance (Feet): 200 Feet Assistive device: Rolling walker (2 wheeled) Gait Pattern/deviations: Step-through pattern;Decreased stride length   Gait velocity interpretation: 1.31 - 2.62 ft/sec, indicative of limited community ambulator General Gait Details: 4 standing rest breaks during gait with pt controlling gait and activity appropriately, pt with improved posture and tolerance   Stairs             Wheelchair Mobility    Modified Rankin (Stroke Patients  Only)       Balance Overall balance assessment: Needs assistance   Sitting balance-Leahy Scale: Good Sitting balance - Comments: sitting EOB without UE support       Standing balance comment: reliant on RW                            Cognition Arousal/Alertness: Awake/alert Behavior During Therapy: WFL for tasks assessed/performed Overall Cognitive Status: Within Functional Limits for tasks assessed                                        Exercises General Exercises - Lower Extremity Heel Slides: AROM;Right;Supine;10 reps Hip Flexion/Marching: AROM;Both;Seated;10 reps    General Comments        Pertinent Vitals/Pain Pain Assessment: No/denies pain    Home Living                      Prior Function            PT Goals (current goals can now be found in the care plan section) Progress towards PT goals: Progressing toward goals    Frequency    Min 3X/week      PT Plan Current plan remains appropriate    Co-evaluation              AM-PAC PT "6 Clicks" Mobility  Outcome Measure  Help needed turning from your back to your side while in a flat bed without using bedrails?: A Little Help needed moving from lying on your back to sitting on the side of a flat bed without using bedrails?: A Little Help needed moving to and from a bed to a chair (including a wheelchair)?: A Little Help needed standing up from a chair using your arms (e.g., wheelchair or bedside chair)?: A Little Help needed to walk in hospital room?: A Little Help needed climbing 3-5 steps with a railing? : A Lot 6 Click Score: 17    End of Session   Activity Tolerance: Patient tolerated treatment well Patient left: with call bell/phone within reach;in bed Nurse Communication: Mobility status;Precautions PT Visit Diagnosis: Other abnormalities of gait and mobility (R26.89);Pain;Muscle weakness (generalized) (M62.81)     Time: 5916-3846 PT Time  Calculation (min) (ACUTE ONLY): 25 min  Charges:  $Gait Training: 8-22 mins $Therapeutic Exercise: 8-22 mins                     [place], PT Acute Rehabilitation Services Pager: [phone] Office: [place] 05/29/2020, 11:07 AM

## 2020-05-29 NOTE — Progress Notes (Signed)
CARDIAC REHAB PHASE I   Discussed sternal precautions, IS, exercise, diet, and CRPII with pt and wife. Receptive, many questions answered. Will refer to [place]. Pt is very eager to d/c and hoping for today. [place], ACSM 05/29/2020 1:45 PM

## 2020-05-29 NOTE — Plan of Care (Signed)

## 2020-05-29 NOTE — Progress Notes (Addendum)
      [address]       [place] [address]             [phone]      11 Days Post-Op Procedure(s) (LRB): CORONARY ARTERY BYPASS GRAFTING (CABG) TIMES FOUR USING INTERNAL BILATERAL MAMMARY ARTERIES AND HARVESTED LEFT RADIAL ARTERY. (N/A) MAZE USING ATRICURE ISOLATOR CLAMP OLL2 (N/A) RADIAL ARTERY HARVEST (Left) TRANSESOPHAGEAL ECHOCARDIOGRAM (TEE) (N/A) INDOCYANINE GREEN FLUORESCENCE IMAGING (ICG) (N/A) CLIPPING OF ATRIAL APPENDAGE USING ATRICURE 45 MM ATRICLIP (Left) Subjective: Feels good this morning and wants to go home.   Objective: Vital signs in last 24 hours: Temp:  [97.7 F (36.5 C)-98.4 F (36.9 C)] 97.8 F (36.6 C) (10/12 0335) Pulse Rate:  [81-85] 84 (10/11 1633) Cardiac Rhythm: Normal sinus rhythm (10/12 0706) Resp:  [15-20] 20 (10/11 2300) BP: (100-140)/(45-75) 115/48 (10/11 2300) SpO2:  [93 %-95 %] 93 % (10/11 1633) Weight:  [109.4 kg] 109.4 kg (10/12 0500)  Hemodynamic parameters for last 24 hours: CVP:  [4 mmHg] 4 mmHg  Intake/Output from previous day: 10/11 0701 - 10/12 0700 In: 305 [P.O.:240; I.V.:65] Out: 1178 [Urine:1175; Stool:3] Intake/Output this shift: No intake/output data recorded.  General appearance: alert, cooperative and no distress Heart: regular rate and rhythm, S1, S2 normal, no murmur, click, rub or gallop Lungs: clear to auscultation bilaterally Abdomen: soft, non-tender; bowel sounds normal; no masses,  no organomegaly Extremities: wrapped with ACE Wound: staples on the left arm in tact, sternal incision healing well.   Lab Results: Recent Labs    05/27/20 0415  WBC 7.8  HGB 10.3*  HCT 30.3*  PLT 276   BMET:  Recent Labs    05/27/20 1635 05/28/20 0312  NA 130* 129*  K 3.5 4.3  CL 90* 91*  CO2 30 28  GLUCOSE 150* 128*  BUN 26* 22  CREATININE 1.38* 1.10  CALCIUM 8.0* 8.0*    PT/INR: No results for input(s): LABPROT, INR in the last 72 hours. ABG    Component Value Date/Time   PHART 7.513  (H) 05/22/2020 0853   HCO3 32.1 (H) 05/22/2020 0853   TCO2 33 (H) 05/22/2020 0853   ACIDBASEDEF 1.0 05/19/2020 2131   O2SAT 73.8 05/29/2020 0445   CBG (last 3)  Recent Labs    05/28/20 1630 05/28/20 2002 05/29/20 0620  GLUCAP 107* 115* 116*    Assessment/Plan: S/P Procedure(s) (LRB): CORONARY ARTERY BYPASS GRAFTING (CABG) TIMES FOUR USING INTERNAL BILATERAL MAMMARY ARTERIES AND HARVESTED LEFT RADIAL ARTERY. (N/A) MAZE USING ATRICURE ISOLATOR CLAMP OLL2 (N/A) RADIAL ARTERY HARVEST (Left) TRANSESOPHAGEAL ECHOCARDIOGRAM (TEE) (N/A) INDOCYANINE GREEN FLUORESCENCE IMAGING (ICG) (N/A) CLIPPING OF ATRIAL APPENDAGE USING ATRICURE 45 MM ATRICLIP (Left)  1. NSR in the 80s, BP well controlled. Continue Eliquis, Amio, ASA, Digoxin, Entresto, and spironolactone. Off milrinone.  2. Pulm- tolerating room air with good oxygen saturation. 3. Renal-creatinine 1.10, electrolytes. 4. H and H has been stable 5. Endo- blood glucose well controlled 6. BPH- will restart proscar and attempted to restart alfuzosin however pharmacy could not split the pills.   Plan: Remove EPW today. +/- will hold a dose of Eliquis, discussing with  Dr. Orvan Stokes. HH orders have been placed. Not appropriate for CIR per PT.    LOS: 13 days    Eric Stokes 05/29/2020 Discharge home with PT/HH tomorrow Eric Stokes, [place]

## 2020-05-29 NOTE — Progress Notes (Signed)
   05/29/20 0930  Vitals  BP (!) 145/76  MAP (mmHg) 90  Pulse Rate 79  ECG Heart Rate 78  Resp 20  Oxygen Therapy  SpO2 95 %  Invasive Hemodynamic Monitoring  CVP (mmHg) 11 mmHg  MEWS Score  MEWS Temp 0  MEWS Systolic 0  MEWS Pulse 0  MEWS RR 0  MEWS LOC 0  MEWS Score 0  MEWS Score Color Green  pacerwires d/c'd

## 2020-05-29 NOTE — TOC Transition Note (Addendum)
Transition of Care [facility]) - CM/SW Discharge Note   Patient Details  Name: Eric Stokes MRN: [id number] Date of Birth: [birth year]  [facility] CM/SW Contact:  Zenon Mayo, RN Phone Number: 05/29/2020, 3:36 PM   Clinical Narrative:    Patient is for discharge today, NCM spoke with POA Diana Eves who is NP here at [facility], states her father needs a BSC and HHPT,  He will start working with Cardiac rehab 6 weeks later after HHPT.  NCM offered choice to patient first, he states to call daughter.  She states [facility] is ok.  NCM made referral to Amy with [facility].  Soc will begin 24 to 48 hrs post dc.  NCM informed her that patient would like to go thru the [place] for the [place].  Daughter states we will go thru [facility] the [facility].  Amy with [facility] will [facility] up with the [facility] to get the authorization for HHPT.  Patient's PCP is Sheela Stack fax is [phone].  CSW is Kellie Simmering  [phone].    Final next level of care: [place] Barriers to Discharge: No Barriers Identified   Patient Goals and CMS Choice Patient states their goals for this hospitalization and ongoing recovery are:: get better CMS Medicare.gov Compare Post Acute Care list provided to:: Patient Choice offered to / list presented to : Patient  Discharge Placement                       Discharge Plan and Services                DME Arranged: Bedside commode DME Agency: [employer] Date DME Agency Contacted: 05/29/20 Time DME Agency Contacted: 2831 Representative spoke with at DME Agency: [place]: PT [place] Date [place]: 05/29/20 Time [place]: [place] Representative spoke with at [place]: Amy  Social Determinants of Health ([place]) Interventions     Readmission Risk Interventions No flowsheet data found.

## 2020-05-29 NOTE — Progress Notes (Addendum)
Patient ID: Eric Stokes, male   DOB: June 21, 1946, 74 y.o.   MRN: [id number] P    Advanced Heart Failure Rounding Note  PCP-Cardiologist: No primary care provider on file.   Subjective:    Taken to OR 10/1 for CABG + MAZE and LAA clip.   Milrinone stopped yesterday. Co-ox 74%  Feels good. No CP or SOB. Rhythm stable   Foley out. No retention   Objective:   Weight Range: 109.4 kg Body mass index is 34.61 kg/m.   Vital Signs:   Temp:  [97.7 F (36.5 C)-98.4 F (36.9 C)] 97.8 F (36.6 C) (10/12 0335) Pulse Rate:  [81-85] 84 (10/11 1633) Resp:  [15-20] 20 (10/11 2300) BP: (100-140)/(45-75) 115/48 (10/11 2300) SpO2:  [93 %-95 %] 93 % (10/11 1633) Weight:  [109.4 kg] 109.4 kg (10/12 0500) Last BM Date: 05/29/20  Weight change: Filed Weights   05/27/20 0537 05/28/20 0436 05/29/20 0500  Weight: 115.2 kg 111.6 kg 109.4 kg    Intake/Output:   Intake/Output Summary (Last 24 hours) at 05/29/2020 0540 Last data filed at 05/29/2020 9798 Gross per 24 hour  Intake 305.04 ml  Output 1828 ml  Net -1522.96 ml      Physical Exam   General:  Sitting up in bed No resp difficulty HEENT: normal Neck: supple. no JVD. Carotids 2+ bilat; no bruits. No lymphadenopathy or thryomegaly appreciated. Cor: PMI nondisplaced. Chest wound ok Regular rate & rhythm. Soft rub Lungs: clear decreased throguhout Abdomen: obese soft, nontender, nondistended. No hepatosplenomegaly. No bruits or masses. Good bowel sounds. Extremities: no cyanosis, clubbing, rash, edema Neuro: alert & orientedx3, cranial nerves grossly intact. moves all 4 extremities w/o difficulty. Affect pleasant   Telemetry   Sinus 70-80s Personally reviewed   Labs    CBC Recent Labs    05/27/20 0415  WBC 7.8  HGB 10.3*  HCT 30.3*  MCV 85.6  PLT 921   Basic Metabolic Panel Recent Labs    05/27/20 1635 05/28/20 0312  NA 130* 129*  K 3.5 4.3  CL 90* 91*  CO2 30 28  GLUCOSE 150* 128*  BUN 26* 22   CREATININE 1.38* 1.10  CALCIUM 8.0* 8.0*   Liver Function Tests Recent Labs    05/27/20 1635  AST 28  ALT 30  ALKPHOS 50  BILITOT 0.8  PROT 5.5*  ALBUMIN 2.7*   No results for input(s): LIPASE, AMYLASE in the last 72 hours. Cardiac Enzymes No results for input(s): CKTOTAL, CKMB, CKMBINDEX, TROPONINI in the last 72 hours.  BNP: BNP (last 3 results) Recent Labs    05/16/20 1155  BNP 415.7*    ProBNP (last 3 results) No results for input(s): PROBNP in the last 8760 hours.   D-Dimer No results for input(s): DDIMER in the last 72 hours. Hemoglobin A1C No results for input(s): HGBA1C in the last 72 hours. Fasting Lipid Panel No results for input(s): CHOL, HDL, LDLCALC, TRIG, CHOLHDL, LDLDIRECT in the last 72 hours. Thyroid Function Tests No results for input(s): TSH, T4TOTAL, T3FREE, THYROIDAB in the last 72 hours.  Invalid input(s): FREET3  Other results:   Imaging    No results found.   Medications:     Scheduled Medications: . alfuzosin  10 mg Oral Daily  . amiodarone  200 mg Oral BID  . apixaban  5 mg Oral BID  . aspirin EC  81 mg Oral Daily  . bisacodyl  10 mg Oral Daily   Or  . bisacodyl  10 mg Rectal  Daily  . bisacodyl  10 mg Rectal Once  . Chlorhexidine Gluconate Cloth  6 each Topical Daily  . colchicine  0.6 mg Oral BID  . docusate sodium  200 mg Oral Daily  . feeding supplement (ENSURE ENLIVE)  237 mL Oral BID BM  . finasteride  5 mg Oral Daily  . furosemide  40 mg Oral BID  . insulin aspart  0-15 Units Subcutaneous Q4H  . latanoprost  1 drop Both Eyes QHS  . mouth rinse  15 mL Mouth Rinse BID  . melatonin  3 mg Oral QHS  . montelukast  10 mg Oral QHS  . pantoprazole  40 mg Oral Daily  . rosuvastatin  20 mg Oral Daily  . sacubitril-valsartan  1 tablet Oral BID  . sodium chloride flush  10-40 mL Intracatheter Q12H  . sodium chloride flush  3 mL Intravenous Q12H  . spironolactone  25 mg Oral Daily    Infusions: . sodium chloride     . sodium chloride      PRN Medications: levalbuterol, metoprolol tartrate, ondansetron (ZOFRAN) IV, oxyCODONE, sodium chloride flush, sodium chloride flush, sodium chloride flush, sorbitol, traMADol    Assessment/Plan   1. CAD/NSTEMI with critcal LM disease - HS Trop [phone]. LHC with severe multivessel disease.  - S/p CABG 10/1 - ASA/Crestor 20 (myalgias with atorvastatin).  - Placed back on colchicine 10/10. Will continue x 3 months - Add Plavix if ok with TCTS - needs outpatient PT/OT and CR  2. Acute Systolic Heart Failure>>Cardiogenic Shock  - ECHO in 2020 showed EF 60-65%.  - ECHO this admit showed EF is down to 25-30%. RV normal.  Repeat echo post-op on 10/2 showed EF 25%, normal RV, no pericardial effusion.  -Limited ECHO repeated 10/10 and showed EF improved 40-45% normal RV.  - Ischemic CMP.  - Off milrinone co-ox 74%  - Volume status improved - Switch lasix to 80 daily - Continue Spiro to 25 daily  - Continue Entresto 24-26 bid - no b-blocker yet due to low output. We wil start in clinic -  Renal function improved  3. A fib RVR  - s/p MAZE w/ LAA Clip  - maintaining NSR.  - continue amio 200 bid - continue Eliquis  5 bid  - no bleeding issues  4. H/o Prostate Cancer - foley out. No retention  - continue alfuzosin  5. Hypokalemia - Stable.   6. Post-op ileus - resolved  7. R pleural effusion -CT chest showed small effusion to small for chest tube.   Ok for d/c from our standpoint on above meds.   Length of Stay: 13  Glori Bickers, MD  5:40 AM

## 2020-05-30 ENCOUNTER — Telehealth (HOSPITAL_COMMUNITY): Payer: Self-pay | Admitting: *Deleted

## 2020-05-30 LAB — CBC
HCT: 34.2 % — ABNORMAL LOW (ref 39.0–52.0)
Hemoglobin: 11.2 g/dL — ABNORMAL LOW (ref 13.0–17.0)
MCH: 27.7 pg (ref 26.0–34.0)
MCHC: 32.7 g/dL (ref 30.0–36.0)
MCV: 84.7 fL (ref 80.0–100.0)
Platelets: 400 10*3/uL (ref 150–400)
RBC: 4.04 MIL/uL — ABNORMAL LOW (ref 4.22–5.81)
RDW: 13.9 % (ref 11.5–15.5)
WBC: 8.4 10*3/uL (ref 4.0–10.5)
nRBC: 0 % (ref 0.0–0.2)

## 2020-05-30 LAB — GLUCOSE, CAPILLARY
Glucose-Capillary: 106 mg/dL — ABNORMAL HIGH (ref 70–99)
Glucose-Capillary: 122 mg/dL — ABNORMAL HIGH (ref 70–99)
Glucose-Capillary: 131 mg/dL — ABNORMAL HIGH (ref 70–99)
Glucose-Capillary: 93 mg/dL (ref 70–99)

## 2020-05-30 LAB — COMPREHENSIVE METABOLIC PANEL
ALT: 36 U/L (ref 0–44)
AST: 35 U/L (ref 15–41)
Albumin: 2.5 g/dL — ABNORMAL LOW (ref 3.5–5.0)
Alkaline Phosphatase: 50 U/L (ref 38–126)
Anion gap: 9 (ref 5–15)
BUN: 18 mg/dL (ref 8–23)
CO2: 27 mmol/L (ref 22–32)
Calcium: 8.1 mg/dL — ABNORMAL LOW (ref 8.9–10.3)
Chloride: 94 mmol/L — ABNORMAL LOW (ref 98–111)
Creatinine, Ser: 0.9 mg/dL (ref 0.61–1.24)
GFR, Estimated: >60 mL/min (ref >60)
Glucose, Bld: 122 mg/dL — ABNORMAL HIGH (ref 70–99)
Potassium: 3.6 mmol/L (ref 3.5–5.1)
Sodium: 130 mmol/L — ABNORMAL LOW (ref 135–145)
Total Bilirubin: 1 mg/dL (ref 0.3–1.2)
Total Protein: 5.1 g/dL — ABNORMAL LOW (ref 6.5–8.1)

## 2020-05-30 LAB — COOXEMETRY PANEL
Carboxyhemoglobin: 1.5 % (ref 0.5–1.5)
Methemoglobin: 0.8 % (ref 0.0–1.5)
O2 Saturation: 72.4 %
Total hemoglobin: 9.8 g/dL — ABNORMAL LOW (ref 12.0–16.0)

## 2020-05-30 MED ORDER — SACUBITRIL-VALSARTAN 24-26 MG PO TABS: 1.0000 | ORAL_TABLET | Freq: Two times a day (BID) | ORAL | 1 refills | Status: DC

## 2020-05-30 MED ORDER — FUROSEMIDE 80 MG PO TABS: 80.0000 mg | ORAL_TABLET | Freq: Every day | ORAL | 1 refills | Status: DC

## 2020-05-30 MED ORDER — OXYCODONE HCL 5 MG PO TABS
5.0000 mg | ORAL_TABLET | Freq: Four times a day (QID) | ORAL | 0 refills | Status: DC | PRN
Start: 2020-05-30 — End: 2020-06-04

## 2020-05-30 MED ORDER — ASPIRIN 81 MG PO TBEC
81.0000 mg | DELAYED_RELEASE_TABLET | Freq: Every day | ORAL | 11 refills | Status: DC
Start: 2020-05-30 — End: 2020-07-31

## 2020-05-30 MED ORDER — APIXABAN 5 MG PO TABS
5.0000 mg | ORAL_TABLET | Freq: Two times a day (BID) | ORAL | 1 refills | Status: DC
Start: 2020-05-30 — End: 2020-07-27

## 2020-05-30 MED ORDER — ROSUVASTATIN CALCIUM 20 MG PO TABS
20.0000 mg | ORAL_TABLET | Freq: Every day | ORAL | 1 refills | Status: DC
Start: 2020-05-30 — End: 2020-07-31

## 2020-05-30 MED ORDER — SPIRONOLACTONE 25 MG PO TABS
25.0000 mg | ORAL_TABLET | Freq: Every day | ORAL | 1 refills | Status: DC
Start: 2020-05-30 — End: 2020-07-25

## 2020-05-30 MED ORDER — AMIODARONE HCL 200 MG PO TABS
200.0000 mg | ORAL_TABLET | Freq: Two times a day (BID) | ORAL | 1 refills | Status: DC
Start: 2020-05-30 — End: 2020-07-03

## 2020-05-30 MED ORDER — COLCHICINE 0.6 MG PO TABS
0.6000 mg | ORAL_TABLET | Freq: Two times a day (BID) | ORAL | 0 refills | Status: DC
Start: 2020-05-30 — End: 2020-06-04

## 2020-05-30 NOTE — Progress Notes (Signed)
      [address]       [place] [address]             Estelle 08-19-1945 [phone]    Eric Stokes will require follow up care for his CHF at [place].  Also to promote full recovery from his surgery, he will require cardiac rehabilitation classes provided by [facility] to be started once patient's home physical therapy is completed.  Ellwood Handler, PA-C 05/29/2020

## 2020-05-30 NOTE — Telephone Encounter (Signed)
pts daughter Barnetta Chapel left VM asking if pt needed to continue colchicine. He was discharged from the hospital today and has bad diarrhea.   Routed to [place]  for advice

## 2020-05-30 NOTE — Progress Notes (Signed)
Patient had a bariatric BSC, PICC was out by IV team. Pt's daughter received discharge instructions. She understood it well and took his all belongings. HS [employer]

## 2020-05-30 NOTE — Progress Notes (Signed)
RUA PICC line d/c'ed per order.  Sight cleaned with CHG, covered with vaseline guaze and dry 2x2.  No signs of infection noted or bleeding.  Pt  Verbalizes understanding of site care, signs of infection and bleeding and interventions for each via teachback method.

## 2020-05-30 NOTE — Progress Notes (Signed)
      [address]       [place] [address]             [phone]      12 Days Post-Op Procedure(s) (LRB): CORONARY ARTERY BYPASS GRAFTING (CABG) TIMES FOUR USING INTERNAL BILATERAL MAMMARY ARTERIES AND HARVESTED LEFT RADIAL ARTERY. (N/A) MAZE USING ATRICURE ISOLATOR CLAMP OLL2 (N/A) RADIAL ARTERY HARVEST (Left) TRANSESOPHAGEAL ECHOCARDIOGRAM (TEE) (N/A) INDOCYANINE GREEN FLUORESCENCE IMAGING (ICG) (N/A) CLIPPING OF ATRIAL APPENDAGE USING ATRICURE 45 MM ATRICLIP (Left) Subjective: Really wants to go home today.   Objective: Vital signs in last 24 hours: Temp:  [97.6 F (36.4 C)-97.9 F (36.6 C)] 97.6 F (36.4 C) (10/13 0436) Pulse Rate:  [74-83] 81 (10/13 0436) Cardiac Rhythm: Normal sinus rhythm (10/13 0436) Resp:  [17-20] 20 (10/13 0436) BP: (101-163)/(36-78) 101/58 (10/13 0436) SpO2:  [94 %-97 %] 96 % (10/13 0436) Weight:  [107 kg] 107 kg (10/13 0500)  Hemodynamic parameters for last 24 hours: CVP:  [4 mmHg-11 mmHg] 4 mmHg  Intake/Output from previous day: 10/12 0701 - 10/13 0700 In: 850 [P.O.:840; I.V.:10] Out: 676 [Urine:675; Stool:1] Intake/Output this shift: No intake/output data recorded.  General appearance: alert, cooperative and no distress Heart: regular rate and rhythm, S1, S2 normal, no murmur, click, rub or gallop Lungs: clear to auscultation bilaterally Abdomen: soft, non-tender; bowel sounds normal; no masses,  no organomegaly Extremities: extremities normal, atraumatic, no cyanosis or edema Wound: clean and dry  Lab Results: No results for input(s): WBC, HGB, HCT, PLT in the last 72 hours. BMET: Recent Labs    05/27/20 1635 05/28/20 0312  NA 130* 129*  K 3.5 4.3  CL 90* 91*  CO2 30 28  GLUCOSE 150* 128*  BUN 26* 22  CREATININE 1.38* 1.10  CALCIUM 8.0* 8.0*    PT/INR: No results for input(s): LABPROT, INR in the last 72 hours. ABG    Component Value Date/Time   PHART 7.513 (H) 05/22/2020 0853   HCO3 32.1 (H)  05/22/2020 0853   TCO2 33 (H) 05/22/2020 0853   ACIDBASEDEF 1.0 05/19/2020 2131   O2SAT 72.4 05/30/2020 0435   CBG (last 3)  Recent Labs    05/29/20 2141 05/30/20 0012 05/30/20 0437  GLUCAP 121* 93 106*    Assessment/Plan: S/P Procedure(s) (LRB): CORONARY ARTERY BYPASS GRAFTING (CABG) TIMES FOUR USING INTERNAL BILATERAL MAMMARY ARTERIES AND HARVESTED LEFT RADIAL ARTERY. (N/A) MAZE USING ATRICURE ISOLATOR CLAMP OLL2 (N/A) RADIAL ARTERY HARVEST (Left) TRANSESOPHAGEAL ECHOCARDIOGRAM (TEE) (N/A) INDOCYANINE GREEN FLUORESCENCE IMAGING (ICG) (N/A) CLIPPING OF ATRIAL APPENDAGE USING ATRICURE 45 MM ATRICLIP (Left)  1. NSR in the 80s, BP well controlled. Continue Eliquis, Amio, ASA, Digoxin, Entresto, and spironolactone. No plavix per Dr. Orvan Seen.  2. Pulm- tolerating room air with good oxygen saturation. 3. Renal-creatinine 1.10, electrolytes. 4. H and H has been stable 5. Endo- blood glucose well controlled 6. BPH- on proscar and attempted to restart alfuzosin however pharmacy could not split the pills.  Plan: Home with home health nursing and PT.  Orders have been placed. No plavix.    LOS: 14 days    Eric Stokes 05/30/2020

## 2020-05-30 NOTE — Progress Notes (Signed)
Patient needs bariatric BSC due to body habitus.

## 2020-05-30 NOTE — Progress Notes (Signed)
Pt just back to bed, declined ambulation. Had pt practice IS. Set up d/c video for him to view while resting. No d/c questions, sts he will ambulate this afternoon at home. [place] CES, ACSM 9:32 AM 05/30/2020

## 2020-05-31 ENCOUNTER — Other Ambulatory Visit: Payer: Self-pay | Admitting: Family Medicine

## 2020-05-31 DIAGNOSIS — E785 Hyperlipidemia, unspecified: Secondary | ICD-10-CM

## 2020-05-31 NOTE — Telephone Encounter (Signed)
Ok to stop

## 2020-05-31 NOTE — Telephone Encounter (Signed)
Pt's daughter aware.

## 2020-06-01 ENCOUNTER — Telehealth: Payer: Self-pay

## 2020-06-01 NOTE — Telephone Encounter (Cosign Needed)
Transition Care Management Unsuccessful Follow-up Telephone Call  Date of discharge and from where:  05/30/20 From [facility]  Attempts:  3rd Attempt  Reason for unsuccessful TCM follow-up call:  Left voice message

## 2020-06-03 NOTE — Progress Notes (Signed)
Advanced Heart Failure Clinic Note   Referring Physician: PCP: Leamon Arnt, MD PCP-Cardiologist: No primary care provider on file.   HPI  [name] is a 74 year old with obesity, HTN, OSA on CPAP, HL, prostate CA, PAF, CAD s/p CABG 35/0/09 and systolic HF.   He presents today for post-CABG f/u.    Had ECHO in 2020 that showed EF 60-65% with normal RV.   Presented to [facility] via EMS with chest pain and shortness of breath. Arrived in the ED was in A fib RVR and had ST depression. HS Trop [id number]. Cath showed severe multivessel disease with elevated filling pressures. Echo EF 25-30% with mild AS. IABP placed  Underwent CABG 05/18/20 with Maze and LAA clipping. LIMA -> LAD, RIMA  -> PDA. Left radial OM1-> OM2  Post CABG course was slow but relatively uneventful. Discharged home on 10/13. - ECHO repeated 10/10 and showed EF improved 40-45% normal RV.   Here for f/u. When he got home had frequent diarrhea and was orthostatic. Colchicine and stool softeners stopped. Now taking Pepto to slow his stool down and feeling better. No longer orthostatic. Now can walk all over the house. No CP or SOB. Has some LE edema. Legs wrapped     Studies:    RHC/LHC 05/17/20   Ost LM to Mid LM lesion is 99% stenosed.  Ost RCA to Prox RCA lesion is 99% stenosed.  Prox RCA to Mid RCA lesion is 80% stenosed. RA 17 PA 40/11 PCWP 28  CO 5 CI 2.2  Echo 05/17/20  1. There is akinesis of the mid-to-distal anterolateral, all  mid-to-distal septal, mid-to-distal anterior, and all apical segments. The  rest of the LV segments are hypokinetic. Findings are suggestive of  multivessel CAD.Marland Kitchen Left ventricular ejection  fraction, by estimation, is 25 to 30%. The left ventricle has severely  decreased function. The left ventricle demonstrates regional wall motion  abnormalities (see scoring diagram/findings for description). There is  moderate concentric left ventricular  hypertrophy. Left  ventricular diastolic parameters are indeterminate.  2. Right ventricular systolic function is normal. The right ventricular  size is normal. There is mildly elevated pulmonary artery systolic  pressure.  3. Left atrial size was moderately dilated.  4. The mitral valve is degenerative. Mild mitral valve regurgitation. No  evidence of mitral stenosis.  5. The aortic valve is tricuspid. There is moderate calcification of the  aortic valve. There is moderate thickening of the aortic valve. Aortic  valve regurgitation is not visualized. Mild aortic valve stenosis.  6. The inferior vena cava is dilated in size with >50% respiratory  variability, suggesting right atrial pressure of 8 mmHg.      Past Medical History:  Diagnosis Date  . Aortic stenosis, mild    noted on ECHO  . Asthma   . Benign essential hypertension, on Norvasc and Amlodipine 12/21/2008   Followed by [facility]   . BPH (benign prostatic hyperplasia) 09/03/2012  . Carotid bruit present 06/15/2018   2015 Carotid Dopplers:  Essentially normal carotid arteries, with very slight hard plaque in the proximal ICA's and serpentine distal vessels. 1-39% bilateral ICA stenosis. Patent vertebral arteries with antegrade flow. Normal subclavian arteries, bilaterally.  . Colonic polyp 01/09/2020  . COPD (chronic obstructive pulmonary disease) ([place])   . Dyslipidemia 12/21/2008   Qualifier: Diagnosis of  By: Percival Spanish, MD, Farrel Gordon    . Essential hypertension, benign 12/21/2008   Qualifier: Diagnosis of  By: Percival Spanish, MD, Farrel Gordon    .  Former smoker, stopped smoking in distant past 03/15/2018  . Hematuria 09/16/2011  . History of migraine   . OA (osteoarthritis)   . Obese   . OSA on CPAP 09/25/2015   Diagnosed at the [facility] 2016   . Prostate cancer ([place]) 11/30/2019  . PVC's (premature ventricular contractions)   . Seasonal allergies   . Sinus bradycardia   . Squamous cell carcinoma of skin   . TIA (transient  ischemic attack)    questionable  . Vitamin B 12 deficiency   . Vitamin D deficiency 11/26/2010    Current Outpatient Medications  Medication Sig Dispense Refill  . alfuzosin (UROXATRAL) 10 MG 24 hr tablet Take 5 mg by mouth in the morning and at bedtime.    Marland Kitchen amiodarone (PACERONE) 200 MG tablet Take 1 tablet (200 mg total) by mouth 2 (two) times daily. 60 tablet 1  . apixaban (ELIQUIS) 5 MG TABS tablet Take 1 tablet (5 mg total) by mouth 2 (two) times daily. 60 tablet 1  . aspirin EC 81 MG EC tablet Take 1 tablet (81 mg total) by mouth daily. Swallow whole. 30 tablet 11  . carboxymethylcellulose (REFRESH PLUS) 0.5 % SOLN Place 1 drop into both eyes 2 (two) times daily as needed (dry eyes).    . cetirizine (ZYRTEC) 10 MG tablet Take 10 mg by mouth daily.    . Cholecalciferol (VITAMIN D3) 2000 UNITS TABS Take 2,000 Int'l Units by mouth daily.     . finasteride (PROSCAR) 5 MG tablet Take 5 mg by mouth daily.    . fluticasone (FLONASE) 50 MCG/ACT nasal spray USE TWO SPRAYS IN EACH NOSTRIL DAILY AS NEEDED. (Patient taking differently: Place 2 sprays into both nostrils daily as needed for allergies. ) 48 mL 0  . furosemide (LASIX) 80 MG tablet Take 1 tablet (80 mg total) by mouth daily. 30 tablet 1  . latanoprost (XALATAN) 0.005 % ophthalmic solution INSTILL 1 DROP INTO BOTH EYES AT BEDTIME (Patient taking differently: Place 1 drop into both eyes daily. ) 7.5 mL 0  . montelukast (SINGULAIR) 10 MG tablet TAKE 1 TABLET BY MOUTH EVERY DAY AT BEDTIME 90 tablet 3  . rosuvastatin (CRESTOR) 20 MG tablet Take 1 tablet (20 mg total) by mouth daily. 30 tablet 1  . sacubitril-valsartan (ENTRESTO) 24-26 MG Take 1 tablet by mouth 2 (two) times daily. 60 tablet 1  . spironolactone (ALDACTONE) 25 MG tablet Take 1 tablet (25 mg total) by mouth daily. 30 tablet 1  . NON FORMULARY Cpap at night     No current facility-administered medications for this encounter.    Allergies  Allergen Reactions  . Tamsulosin  Other (See Comments)    dizziness  . Cipro [Ciprofloxacin Hcl] Swelling    Knee swelling, joint pain  . Lipitor [Atorvastatin] Other (See Comments)    Leg Cramps  . Lisinopril Cough      Social History   Socioeconomic History  . Marital status: Married    Spouse name: Not on file  . Number of children: Not on file  . Years of education: Not on file  . Highest education level: Not on file  Occupational History  . Occupation: retired  Tobacco Use  . Smoking status: Former Smoker    Types: Cigarettes    Quit date: 08/18/1965    Years since quitting: 54.8  . Smokeless tobacco: Never Used  . Tobacco comment: pt quit smoking 53 yrs ago  Vaping Use  . Vaping Use: Never  used  Substance and Sexual Activity  . Alcohol use: Yes    Alcohol/week: 2.0 - 6.0 standard drinks    Types: 2 - 6 Cans of beer per week  . Drug use: No  . Sexual activity: Yes  Other Topics Concern  . Not on file  Social History Narrative   Is a [place] veteran, has trouble sleeping most nights. Often experiences nightmares w/anesthesia meds.   Social Determinants of Health   Financial Resource Strain: Low Risk   . Difficulty of Paying Living Expenses: Not hard at all  Food Insecurity: No Food Insecurity  . Worried About Charity fundraiser in the Last Year: Never true  . Ran Out of Food in the Last Year: Never true  Transportation Needs: No Transportation Needs  . Lack of Transportation (Medical): No  . Lack of Transportation (Non-Medical): No  Physical Activity: Unknown  . Days of Exercise per Week: 7 days  . Minutes of Exercise per Session: Not on file  Stress: No Stress Concern Present  . Feeling of Stress : Not at all  Social Connections: Moderately Isolated  . Frequency of Communication with Friends and Family: More than three times a week  . Frequency of Social Gatherings with Friends and Family: More than three times a week  . Attends Religious Services: Never  . Active Member of Clubs or  Organizations: No  . Attends Archivist Meetings: Never  . Marital Status: Married  Human resources officer Violence: Not At Risk  . Fear of Current or Ex-Partner: No  . Emotionally Abused: No  . Physically Abused: No  . Sexually Abused: No      Family History  Problem Relation Age of Onset  . Diabetes Mother   . COPD Father   . Stroke Paternal Grandfather     Vitals:   06/04/20 1052  BP: (!) 94/58  Pulse: 89  SpO2: 97%  Weight: 105.1 kg  Height: 5\' 10"  (1.778 m)   Seated BP 94/58 standing 106/58  PHYSICAL EXAM: General:  Well appearing. No respiratory difficulty HEENT: normal Neck: supple. no JVD. Carotids 2+ bilat; no bruits. No lymphadenopathy or thyromegaly appreciated. Cor: Sternal wound PMI nondisplaced. Regular rate & rhythm. No rubs, gallops or murmurs. Lungs: clear Abdomen: obese soft, nontender, nondistended. No hepatosplenomegaly. No bruits or masses. Good bowel sounds. Extremities: no cyanosis, clubbing, rash, edema +skin tenting + left radial artery staples Neuro: alert & oriented x 3, cranial nerves grossly intact. moves all 4 extremities w/o difficulty. Affect pleasant.  ECG: NSR 95 +LVH No ST-T wave abnormalities.    ASSESSMENT & PLAN:  1. CAD/NSTEMIwith critcal LM disease - LHC 9/21  with severe multivessel disease.  - s/p CABG 05/18/20 with Maze and LAA clipping. LIMA -> LAD, RIMA  -> PDA. Left radial OM1-> OM2 - continue ASA/Crestor 20 (myalgias with atorvastatin).  - needs outpatient CR  2. Acute Systolic Heart Failure due to ischemic CM - ECHO in 2020 showed EF 60-65%. - ECHO 04/2124-30%. RV normal. Mild AS   - ECHO repeated 10/10 and showed EF improved 40-45% normal RV.  - NYHA II-III - Weight way down in setting of severe diarrhea. Volume status looks low. He is dizzy when standing but not frankly orthostatic - Change lasix to prn only - Continue Spiro 25 - Continue Entresto 24-26 bid - check labs today  3. A fib with RVR  - s/p  MAZE w/ LAA Clip  - maintaining NSR.  - continue amio 200 bid -  continue Eliquis  5 bid  - no bleeding issues  4. H/o Prostate Cancer - foley out. No retention  - continue alfuzosin    Glori Bickers, MD 06/04/20

## 2020-06-04 ENCOUNTER — Encounter (HOSPITAL_COMMUNITY): Payer: Self-pay | Admitting: Internal Medicine

## 2020-06-04 ENCOUNTER — Ambulatory Visit (HOSPITAL_COMMUNITY)
Admit: 2020-06-04 | Discharge: 2020-06-04 | Disposition: A | Discharge status: Home / Self Care | Payer: No Typology Code available for payment source | Attending: Internal Medicine | Admitting: Internal Medicine

## 2020-06-04 ENCOUNTER — Other Ambulatory Visit: Payer: Self-pay

## 2020-06-04 VITALS — BP 94/58 | HR 89 | Ht 70.0 in | Wt 231.6 lb

## 2020-06-04 DIAGNOSIS — N400 Enlarged prostate without lower urinary tract symptoms: Secondary | ICD-10-CM | POA: Insufficient documentation

## 2020-06-04 DIAGNOSIS — J449 Chronic obstructive pulmonary disease, unspecified: Secondary | ICD-10-CM | POA: Insufficient documentation

## 2020-06-04 DIAGNOSIS — I2510 Atherosclerotic heart disease of native coronary artery without angina pectoris: Secondary | ICD-10-CM | POA: Diagnosis not present

## 2020-06-04 DIAGNOSIS — I340 Nonrheumatic mitral (valve) insufficiency: Secondary | ICD-10-CM | POA: Insufficient documentation

## 2020-06-04 DIAGNOSIS — Z7901 Long term (current) use of anticoagulants: Secondary | ICD-10-CM | POA: Diagnosis not present

## 2020-06-04 DIAGNOSIS — Z8673 Personal history of transient ischemic attack (TIA), and cerebral infarction without residual deficits: Secondary | ICD-10-CM | POA: Insufficient documentation

## 2020-06-04 DIAGNOSIS — M1990 Unspecified osteoarthritis, unspecified site: Secondary | ICD-10-CM | POA: Insufficient documentation

## 2020-06-04 DIAGNOSIS — G4733 Obstructive sleep apnea (adult) (pediatric): Secondary | ICD-10-CM | POA: Diagnosis not present

## 2020-06-04 DIAGNOSIS — Z7982 Long term (current) use of aspirin: Secondary | ICD-10-CM | POA: Diagnosis not present

## 2020-06-04 DIAGNOSIS — I252 Old myocardial infarction: Secondary | ICD-10-CM | POA: Insufficient documentation

## 2020-06-04 DIAGNOSIS — I4891 Unspecified atrial fibrillation: Secondary | ICD-10-CM | POA: Insufficient documentation

## 2020-06-04 DIAGNOSIS — I5023 Acute on chronic systolic (congestive) heart failure: Secondary | ICD-10-CM | POA: Insufficient documentation

## 2020-06-04 DIAGNOSIS — I4510 Unspecified right bundle-branch block: Secondary | ICD-10-CM | POA: Diagnosis not present

## 2020-06-04 DIAGNOSIS — Z85828 Personal history of other malignant neoplasm of skin: Secondary | ICD-10-CM | POA: Diagnosis not present

## 2020-06-04 DIAGNOSIS — E669 Obesity, unspecified: Secondary | ICD-10-CM | POA: Insufficient documentation

## 2020-06-04 DIAGNOSIS — Z8546 Personal history of malignant neoplasm of prostate: Secondary | ICD-10-CM | POA: Diagnosis not present

## 2020-06-04 DIAGNOSIS — Z87891 Personal history of nicotine dependence: Secondary | ICD-10-CM | POA: Insufficient documentation

## 2020-06-04 DIAGNOSIS — I5032 Chronic diastolic (congestive) heart failure: Secondary | ICD-10-CM | POA: Diagnosis not present

## 2020-06-04 DIAGNOSIS — I110 Hypertensive heart disease with heart failure: Secondary | ICD-10-CM | POA: Insufficient documentation

## 2020-06-04 DIAGNOSIS — I350 Nonrheumatic aortic (valve) stenosis: Secondary | ICD-10-CM | POA: Diagnosis not present

## 2020-06-04 DIAGNOSIS — Z951 Presence of aortocoronary bypass graft: Secondary | ICD-10-CM | POA: Diagnosis not present

## 2020-06-04 DIAGNOSIS — E785 Hyperlipidemia, unspecified: Secondary | ICD-10-CM | POA: Diagnosis not present

## 2020-06-04 LAB — COMPREHENSIVE METABOLIC PANEL
ALT: 38 U/L (ref 0–44)
AST: 27 U/L (ref 15–41)
Albumin: 3.1 g/dL — ABNORMAL LOW (ref 3.5–5.0)
Alkaline Phosphatase: 56 U/L (ref 38–126)
Anion gap: 10 (ref 5–15)
BUN: 19 mg/dL (ref 8–23)
CO2: 22 mmol/L (ref 22–32)
Calcium: 8.5 mg/dL — ABNORMAL LOW (ref 8.9–10.3)
Chloride: 97 mmol/L — ABNORMAL LOW (ref 98–111)
Creatinine, Ser: 1.05 mg/dL (ref 0.61–1.24)
GFR, Estimated: >60 mL/min (ref >60)
Glucose, Bld: 115 mg/dL — ABNORMAL HIGH (ref 70–99)
Potassium: 4.1 mmol/L (ref 3.5–5.1)
Sodium: 129 mmol/L — ABNORMAL LOW (ref 135–145)
Total Bilirubin: 0.7 mg/dL (ref 0.3–1.2)
Total Protein: 6 g/dL — ABNORMAL LOW (ref 6.5–8.1)

## 2020-06-04 LAB — CBC
HCT: 37.4 % — ABNORMAL LOW (ref 39.0–52.0)
Hemoglobin: 11.9 g/dL — ABNORMAL LOW (ref 13.0–17.0)
MCH: 27.7 pg (ref 26.0–34.0)
MCHC: 31.8 g/dL (ref 30.0–36.0)
MCV: 87 fL (ref 80.0–100.0)
Platelets: 487 10*3/uL — ABNORMAL HIGH (ref 150–400)
RBC: 4.3 MIL/uL (ref 4.22–5.81)
RDW: 14.3 % (ref 11.5–15.5)
WBC: 8.5 10*3/uL (ref 4.0–10.5)
nRBC: 0 % (ref 0.0–0.2)

## 2020-06-04 LAB — BRAIN NATRIURETIC PEPTIDE: B Natriuretic Peptide: 311.8 pg/mL — ABNORMAL HIGH (ref 0.0–100.0)

## 2020-06-04 MED ORDER — FUROSEMIDE 80 MG PO TABS: 80.0000 mg | ORAL_TABLET | Freq: Every day | ORAL | 1 refills | Status: DC | PRN

## 2020-06-04 MED ORDER — TRAZODONE HCL 100 MG PO TABS: 100.0000 mg | ORAL_TABLET | Freq: Every evening | ORAL | 0 refills | Status: DC | PRN

## 2020-06-04 NOTE — Addendum Note (Signed)
Encounter addended by: Jolaine Artist, MD on: 06/04/2020 3:19 PM  Actions taken: Level of Service modified, Visit diagnoses modified

## 2020-06-04 NOTE — Patient Instructions (Signed)
Take Furosemide only AS NEEDED  Take Trazodone 100 mg at bedtime AS NEEDED to help you sleep, we have you provided you a 30 day prescription for this, further refills will need to come from your primary care MD  Labs done today, your results will be available in MyChart, we will contact you for abnormal readings.  Please wear your compression hose daily, place them on as soon as you get up in the morning and remove before you go to bed at night.  Your physician recommends that you schedule a follow-up appointment in: 3-4 weeks  If you have any questions or concerns before your next appointment please send [place] a message through [place] or call our office at [phone].    TO LEAVE A MESSAGE FOR THE NURSE SELECT OPTION 2, PLEASE LEAVE A MESSAGE INCLUDING: . YOUR NAME . DATE OF BIRTH . CALL BACK NUMBER . REASON FOR CALL**this is important as we prioritize the call backs  [place] AS LONG AS YOU CALL BEFORE 4:00 PM  At the [facility], you and your health needs are our priority. As part of our continuing mission to provide you with exceptional heart care, we have created designated Provider Care Teams. These Care Teams include your primary Cardiologist (physician) and Advanced Practice Providers (APPs- Physician Assistants and Nurse Practitioners) who all work together to provide you with the care you need, when you need it.   You may see any of the following providers on your designated Care Team at your next follow up: Marland Kitchen Dr Glori Bickers . Dr Loralie Champagne . Darrick Grinder, NP . Lyda Jester, PA . Audry Riles, PharmD   Please be sure to bring in all your medications bottles to every appointment.

## 2020-06-05 ENCOUNTER — Ambulatory Visit (INDEPENDENT_AMBULATORY_CARE_PROVIDER_SITE_OTHER): Payer: Self-pay

## 2020-06-05 DIAGNOSIS — Z4802 Encounter for removal of sutures: Secondary | ICD-10-CM

## 2020-06-05 NOTE — Progress Notes (Signed)
Patient arrived for nurse visit to remove suture/staples post- procedure CABG 05/18/2020 with Dr. Orvan Seen.  4 mid-abdominal Sutures/ 2 left radial EVH site staples removed with no signs/ symptoms of infection noted.  Unable to remove rest of staples due to incision dehiscence, non approximated in some areas.  Patient does have a follow-up appointment Monday with Dr. Orvan Seen, and can be reassessed for staple removal at that point. Patient tolerated procedure well.  Patient/ family instructed to keep the incision sites clean and dry.  Patient/ family acknowledged instructions given.

## 2020-06-08 ENCOUNTER — Other Ambulatory Visit: Payer: Self-pay | Admitting: Cardiothoracic Surgery

## 2020-06-08 ENCOUNTER — Telehealth (HOSPITAL_COMMUNITY): Payer: Self-pay

## 2020-06-08 DIAGNOSIS — Z951 Presence of aortocoronary bypass graft: Secondary | ICD-10-CM

## 2020-06-08 NOTE — Telephone Encounter (Signed)
Pt insurance is active and benefits verified through [facility] Medicare Co-pay 0, DED 0/0 met, out of pocket $3,600/$956.50 met, co-insurance 0%. no pre-authorization required. Passport, 06/08/2020@1 :54pm, REF# [id number]

## 2020-06-11 ENCOUNTER — Other Ambulatory Visit: Payer: Self-pay

## 2020-06-11 ENCOUNTER — Ambulatory Visit (INDEPENDENT_AMBULATORY_CARE_PROVIDER_SITE_OTHER): Payer: Self-pay | Admitting: Cardiothoracic Surgery

## 2020-06-11 ENCOUNTER — Ambulatory Visit
Admission: RE | Admit: 2020-06-11 | Discharge: 2020-06-11 | Disposition: A | Discharge status: Home / Self Care | Payer: No Typology Code available for payment source | Source: Ambulatory Visit | Attending: Cardiothoracic Surgery | Admitting: Cardiothoracic Surgery

## 2020-06-11 VITALS — BP 105/62 | HR 88 | Temp 97.8°F | Resp 20 | Ht 70.0 in | Wt 230.0 lb

## 2020-06-11 DIAGNOSIS — J9811 Atelectasis: Secondary | ICD-10-CM | POA: Diagnosis not present

## 2020-06-11 DIAGNOSIS — Z951 Presence of aortocoronary bypass graft: Secondary | ICD-10-CM

## 2020-06-11 DIAGNOSIS — I2510 Atherosclerotic heart disease of native coronary artery without angina pectoris: Secondary | ICD-10-CM

## 2020-06-11 DIAGNOSIS — I4891 Unspecified atrial fibrillation: Secondary | ICD-10-CM

## 2020-06-12 NOTE — Progress Notes (Signed)
[address]       [place] [address]             [phone]     CARDIOTHORACIC SURGERY OFFICE NOTE  Referring Provider is Lorretta Harp, MD Primary Cardiologist is No primary care provider on file. PCP is Leamon Arnt, MD   HPI:  73 year old man presents for initial postoperative visit after recent discharge from the hospital having had CABG.  He states he has been doing well and is feeling much better.  He denies any chest pain or shortness of breath.  There is mild incisional soreness   Current Outpatient Medications  Medication Sig Dispense Refill  . alfuzosin (UROXATRAL) 10 MG 24 hr tablet Take 5 mg by mouth in the morning and at bedtime.    Marland Kitchen amiodarone (PACERONE) 200 MG tablet Take 1 tablet (200 mg total) by mouth 2 (two) times daily. 60 tablet 1  . apixaban (ELIQUIS) 5 MG TABS tablet Take 1 tablet (5 mg total) by mouth 2 (two) times daily. 60 tablet 1  . aspirin EC 81 MG EC tablet Take 1 tablet (81 mg total) by mouth daily. Swallow whole. 30 tablet 11  . carboxymethylcellulose (REFRESH PLUS) 0.5 % SOLN Place 1 drop into both eyes 2 (two) times daily as needed (dry eyes).    . cetirizine (ZYRTEC) 10 MG tablet Take 10 mg by mouth daily.    . Cholecalciferol (VITAMIN D3) 2000 UNITS TABS Take 2,000 Int'l Units by mouth daily.     . finasteride (PROSCAR) 5 MG tablet Take 5 mg by mouth daily.    . fluticasone (FLONASE) 50 MCG/ACT nasal spray USE TWO SPRAYS IN EACH NOSTRIL DAILY AS NEEDED. (Patient taking differently: Place 2 sprays into both nostrils daily as needed for allergies. ) 48 mL 0  . furosemide (LASIX) 80 MG tablet Take 1 tablet (80 mg total) by mouth daily as needed. 30 tablet 1  . latanoprost (XALATAN) 0.005 % ophthalmic solution INSTILL 1 DROP INTO BOTH EYES AT BEDTIME (Patient taking differently: Place 1 drop into both eyes daily. ) 7.5 mL 0  . montelukast (SINGULAIR) 10 MG tablet TAKE 1 TABLET BY MOUTH EVERY DAY AT BEDTIME 90 tablet 3   . NON FORMULARY Cpap at night    . rosuvastatin (CRESTOR) 20 MG tablet Take 1 tablet (20 mg total) by mouth daily. 30 tablet 1  . sacubitril-valsartan (ENTRESTO) 24-26 MG Take 1 tablet by mouth 2 (two) times daily. 60 tablet 1  . spironolactone (ALDACTONE) 25 MG tablet Take 1 tablet (25 mg total) by mouth daily. 30 tablet 1  . traZODone (DESYREL) 100 MG tablet Take 1 tablet (100 mg total) by mouth at bedtime as needed for sleep. 30 tablet 0   No current facility-administered medications for this visit.      Physical Exam:   BP 105/62   Pulse 88   Temp 97.8 F (36.6 C) (Skin)   Resp 20   Ht 5\' 10"  (1.778 m)   Wt 104.3 kg   SpO2 95% Comment: RA  BMI 33.00 kg/m   General:  Well-appearing no acute distress  Chest:   Clear to auscultation bilaterally  CV:   Regular rate and rhythm  Incisions:  Well-healed clean dry and intact  Abdomen:  Soft nontender  Extremities:  Mild edema  Diagnostic Tests:  Chest x-ray with clear lung fields and no effusion   Impression:  Doing well after CABG  Plan:  Follow-up in 2 weeks for wound check  I spent in excess of 15 minutes during the conduct of this office consultation and >50% of this time involved direct face-to-face encounter with the patient for counseling and/or coordination of their care.  Level 2                 10 minutes Level 3                 15 minutes Level 4                 25 minutes Level 5                 40 minutes  B.  Murvin Natal, MD 06/12/2020 8:38 AM

## 2020-06-13 ENCOUNTER — Encounter: Payer: Self-pay | Admitting: Family Medicine

## 2020-06-14 MED ORDER — TRAZODONE HCL 100 MG PO TABS: 100.0000 mg | ORAL_TABLET | Freq: Every evening | ORAL | 3 refills | Status: DC | PRN

## 2020-06-21 ENCOUNTER — Other Ambulatory Visit: Payer: Self-pay | Admitting: Physician Assistant

## 2020-06-25 ENCOUNTER — Ambulatory Visit: Payer: Self-pay | Admitting: Cardiothoracic Surgery

## 2020-06-26 ENCOUNTER — Ambulatory Visit: Payer: Self-pay | Admitting: Cardiothoracic Surgery

## 2020-06-26 ENCOUNTER — Other Ambulatory Visit: Payer: Self-pay

## 2020-06-26 ENCOUNTER — Encounter: Payer: Self-pay | Admitting: Cardiothoracic Surgery

## 2020-06-26 VITALS — BP 101/62 | HR 89 | Resp 20 | Ht 70.0 in

## 2020-06-26 DIAGNOSIS — Z951 Presence of aortocoronary bypass graft: Secondary | ICD-10-CM

## 2020-06-26 NOTE — Progress Notes (Signed)
74 year old man presents for second postoperative visit status post CABG and maze.  He originally presented with severe left main disease and had a depressed ejection fraction.  He did ultimately did well after surgery.  He now returns stating that there is improved energy level and appetite.   Physical exam: BP 101/62 (BP Location: Left Arm, Patient Position: Sitting)   Pulse 89   Resp 20   Ht 5\' 10"  (1.778 m)   SpO2 93% Comment: RA with mask on  BMI 33.00 kg/m  Well-appearing no acute distress Clear to auscultation bilaterally; regular rate and rhythm Soft nontender nondistended Mild peripheral edema  Imaging: No new studies Impression: Doing well after CABG  Plan: Follow-up with thoracic surgery as needed Follow-up with Dr. Haroldine Laws next week Eric Stokes. Eric Stokes, [name]

## 2020-07-02 NOTE — Progress Notes (Signed)
Advanced Heart Failure Clinic Note   Referring Physician: PCP: Leamon Arnt, MD PCP-Cardiologist: No primary care provider on file.   HPI Eric Stokes is a 74 year old with obesity, HTN, OSA on CPAP, HL, prostate CA, PAF, CAD s/p CABG 13/0/86 and systolic HF.   Had ECHO in 2020 that showed EF 60-65% with normal RV.   Presented to [facility] via EMS with chest pain and shortness of breath. Arrived in the ED was in A fib RVR and had ST depression. HS Trop [id number]. Cath showed severe multivessel disease with elevated filling pressures. Echo EF 25-30% with mild AS. IABP placed. Underwent CABG 05/18/20 with Maze and LAA clipping. LIMA -> LAD, RIMA  -> PDA. Left radial OM1-> OM2. Post CABG course was slow but relatively uneventful. Discharged home on 10/13. - ECHO repeated 10/10 and showed EF improved 40-45% normal RV.   Today he returns for HF follow up.Overall feeling ok. Says he has some shortness of breath and took 80 mg po lasix for the next 4 days. Today he is lightheaded and dizzy when standing. Mild SOB. Denies PND/Orthopnea. No chest pain. Appetite ok. No fever or chills. Taking all medications.   Studies:  RHC/LHC 05/17/20   Ost LM to Mid LM lesion is 99% stenosed.  Ost RCA to Prox RCA lesion is 99% stenosed.  Prox RCA to Mid RCA lesion is 80% stenosed. RA 17 PA 40/11 PCWP 28  CO 5 CI 2.2  Echo 05/17/20  1. There is akinesis of the mid-to-distal anterolateral, all  mid-to-distal septal, mid-to-distal anterior, and all apical segments. The  rest of the LV segments are hypokinetic. Findings are suggestive of  multivessel CAD.Marland Kitchen Left ventricular ejection  fraction, by estimation, is 25 to 30%. The left ventricle has severely  decreased function. The left ventricle demonstrates regional wall motion  abnormalities (see scoring diagram/findings for description). There is  moderate concentric left ventricular  hypertrophy. Left ventricular diastolic parameters are indeterminate.   2. Right ventricular systolic function is normal. The right ventricular  size is normal. There is mildly elevated pulmonary artery systolic  pressure.  3. Left atrial size was moderately dilated.  4. The mitral valve is degenerative. Mild mitral valve regurgitation. No  evidence of mitral stenosis.  5. The aortic valve is tricuspid. There is moderate calcification of the  aortic valve. There is moderate thickening of the aortic valve. Aortic  valve regurgitation is not visualized. Mild aortic valve stenosis.  6. The inferior vena cava is dilated in size with >50% respiratory  variability, suggesting right atrial pressure of 8 mmHg.   Echo EF 40-45%    Past Medical History:  Diagnosis Date  . Aortic stenosis, mild    noted on ECHO  . Asthma   . Benign essential hypertension, on Norvasc and Amlodipine 12/21/2008   Followed by [facility]   . BPH (benign prostatic hyperplasia) 09/03/2012  . Carotid bruit present 06/15/2018   2015 Carotid Dopplers:  Essentially normal carotid arteries, with very slight hard plaque in the proximal ICA's and serpentine distal vessels. 1-39% bilateral ICA stenosis. Patent vertebral arteries with antegrade flow. Normal subclavian arteries, bilaterally.  . Colonic polyp 01/09/2020  . COPD (chronic obstructive pulmonary disease) ([place])   . Dyslipidemia 12/21/2008   Qualifier: Diagnosis of  By: Percival Spanish, MD, Farrel Gordon    . Essential hypertension, benign 12/21/2008   Qualifier: Diagnosis of  By: Percival Spanish, MD, Farrel Gordon    . Former smoker, stopped smoking in distant past 03/15/2018  .  Hematuria 09/16/2011  . History of migraine   . OA (osteoarthritis)   . Obese   . OSA on CPAP 09/25/2015   Diagnosed at the [facility] 2016   . Prostate cancer ([place]) 11/30/2019  . PVC's (premature ventricular contractions)   . Seasonal allergies   . Sinus bradycardia   . Squamous cell carcinoma of skin   . TIA (transient ischemic attack)    questionable  .  Vitamin B 12 deficiency   . Vitamin D deficiency 11/26/2010    Current Outpatient Medications  Medication Sig Dispense Refill  . alfuzosin (UROXATRAL) 10 MG 24 hr tablet Take 5 mg by mouth in the morning and at bedtime.    Marland Kitchen amiodarone (PACERONE) 200 MG tablet Take 1 tablet (200 mg total) by mouth 2 (two) times daily. 60 tablet 1  . apixaban (ELIQUIS) 5 MG TABS tablet Take 1 tablet (5 mg total) by mouth 2 (two) times daily. 60 tablet 1  . aspirin EC 81 MG EC tablet Take 1 tablet (81 mg total) by mouth daily. Swallow whole. 30 tablet 11  . carboxymethylcellulose (REFRESH PLUS) 0.5 % SOLN Place 1 drop into both eyes 2 (two) times daily as needed (dry eyes).    . cetirizine (ZYRTEC) 10 MG tablet Take 10 mg by mouth daily.    . Cholecalciferol (VITAMIN D3) 2000 UNITS TABS Take 2,000 Int'l Units by mouth daily.     . finasteride (PROSCAR) 5 MG tablet Take 5 mg by mouth daily.    . fluticasone (FLONASE) 50 MCG/ACT nasal spray USE TWO SPRAYS IN EACH NOSTRIL DAILY AS NEEDED. (Patient taking differently: Place 2 sprays into both nostrils daily as needed for allergies. ) 48 mL 0  . furosemide (LASIX) 80 MG tablet Take 1 tablet (80 mg total) by mouth daily as needed. 30 tablet 1  . latanoprost (XALATAN) 0.005 % ophthalmic solution INSTILL 1 DROP INTO BOTH EYES AT BEDTIME (Patient taking differently: Place 1 drop into both eyes daily. ) 7.5 mL 0  . montelukast (SINGULAIR) 10 MG tablet TAKE 1 TABLET BY MOUTH EVERY DAY AT BEDTIME 90 tablet 3  . NON FORMULARY Cpap at night    . rosuvastatin (CRESTOR) 20 MG tablet Take 1 tablet (20 mg total) by mouth daily. 30 tablet 1  . sacubitril-valsartan (ENTRESTO) 24-26 MG Take 1 tablet by mouth 2 (two) times daily. 60 tablet 1  . spironolactone (ALDACTONE) 25 MG tablet Take 1 tablet (25 mg total) by mouth daily. 30 tablet 1  . traZODone (DESYREL) 100 MG tablet Take 1-1.5 tablets (100-150 mg total) by mouth at bedtime as needed for sleep. 90 tablet 3   No current  facility-administered medications for this encounter.    Allergies  Allergen Reactions  . Tamsulosin Other (See Comments)    dizziness  . Cipro [Ciprofloxacin Hcl] Swelling    Knee swelling, joint pain  . Lipitor [Atorvastatin] Other (See Comments)    Leg Cramps  . Lisinopril Cough      Social History   Socioeconomic History  . Marital status: Married    Spouse name: Not on file  . Number of children: Not on file  . Years of education: Not on file  . Highest education level: Not on file  Occupational History  . Occupation: retired  Tobacco Use  . Smoking status: Former Smoker    Types: Cigarettes    Quit date: 08/18/1965    Years since quitting: 54.9  . Smokeless tobacco: Never Used  .  Tobacco comment: pt quit smoking 53 yrs ago  Vaping Use  . Vaping Use: Never used  Substance and Sexual Activity  . Alcohol use: Yes    Alcohol/week: 2.0 - 6.0 standard drinks    Types: 2 - 6 Cans of beer per week  . Drug use: No  . Sexual activity: Yes  Other Topics Concern  . Not on file  Social History Narrative   Is a [place] veteran, has trouble sleeping most nights. Often experiences nightmares w/anesthesia meds.   Social Determinants of Health   Financial Resource Strain: Low Risk   . Difficulty of Paying Living Expenses: Not hard at all  Food Insecurity: No Food Insecurity  . Worried About Charity fundraiser in the Last Year: Never true  . Ran Out of Food in the Last Year: Never true  Transportation Needs: No Transportation Needs  . Lack of Transportation (Medical): No  . Lack of Transportation (Non-Medical): No  Physical Activity: Unknown  . Days of Exercise per Week: 7 days  . Minutes of Exercise per Session: Not on file  Stress: No Stress Concern Present  . Feeling of Stress : Not at all  Social Connections: Moderately Isolated  . Frequency of Communication with Friends and Family: More than three times a week  . Frequency of Social Gatherings with Friends and  Family: More than three times a week  . Attends Religious Services: Never  . Active Member of Clubs or Organizations: No  . Attends Archivist Meetings: Never  . Marital Status: Married  Human resources officer Violence: Not At Risk  . Fear of Current or Ex-Partner: No  . Emotionally Abused: No  . Physically Abused: No  . Sexually Abused: No      Family History  Problem Relation Age of Onset  . Diabetes Mother   . COPD Father   . Stroke Paternal Grandfather     Vitals:   07/03/20 0822  BP: 124/78  Pulse: 76  SpO2: 97%  Weight: 103.4 kg (228 lb)   Wt Readings from Last 3 Encounters:  07/03/20 103.4 kg (228 lb)  06/11/20 104.3 kg (230 lb)  06/04/20 105.1 kg (231 lb 9.6 oz)    ReDS Vest / Clip - 07/03/20 0800      ReDS Vest / Clip   Station Marker D    Ruler Value 35    ReDS Value Range Low volume    ReDS Actual Value 32    Anatomical Comments sitting            PHYSICAL EXAM: General:  Walked in the clinic with a walker. No resp difficulty HEENT: normal Neck: supple. no JVD. Carotids 2+ bilat; no bruits. No lymphadenopathy or thryomegaly appreciated. Cor: PMI nondisplaced. Regular rate & rhythm. No rubs, gallops or murmurs. Lungs: clear Abdomen: soft, nontender, nondistended. No hepatosplenomegaly. No bruits or masses. Good bowel sounds. Extremities: no cyanosis, clubbing, rash, edema Neuro: alert & orientedx3, cranial nerves grossly intact. moves all 4 extremities w/o difficulty. Affect pleasant  EKG: NSR 79 bpm   ASSESSMENT & PLAN:  1. CAD/NSTEMIwith critcal LM disease - LHC 9/21  with severe multivessel disease.  - s/p CABG 05/18/20 with Maze and LAA clipping. LIMA -> LAD, RIMA  -> PDA. Left radial OM1-> OM2 - continue ASA/Crestor 20 (myalgias with atorvastatin).  - Referred to cardiac rehab.  2. Chronic Systolic Heart Failure due to ischemic CM - ECHO in 2020 showed EF 60-65%. - ECHO 04/2124-30%. RV normal. Mild AS   -  ECHO repeated  05/27/2020  and showed EF improved 40-45% normal RV.  - NYHA II-III. Reds Clip 32%. Cut back lasix to 40 mg as needed from 80 mg as needed. He is not fluid overloaded today. ontinue lasix as needed. On exam he appears dry and symptomatic. Told him he could have some higher sodium food today.  - Hold off on BB and consider next visit with dizziness. Consider bisoprolol with COPD.  - Continue Spiro 25 - Continue Entresto 24-26 bid - Consider farxiga at next visit. Considered today but due to dizziness would not start.  - Check BMET now.   3. PAF  - s/p MAZE w/ LAA Clip  -EKG today shows NSR  - Cut back amio to 200 mg daily. Anticipate stopping amio soon with h/o COPD.  - Check LFTs today--> stable.  - TSH 1.8>10.3. Check T3 and T4.  - Discussed that he need yearly eye exams.  - continue Eliquis  5 bid   4. H/o Prostate Cancer  - continue alfuzosin  Follow up in 3 weeks with pharmacy and 8  weeks with Dr Haroldine Laws. Greater than 50% of the (total minutes 25) visit spent in counseling/coordination of care regarding the above.    Darrick Grinder, NP 07/03/20

## 2020-07-03 ENCOUNTER — Other Ambulatory Visit: Payer: Self-pay

## 2020-07-03 ENCOUNTER — Telehealth (HOSPITAL_COMMUNITY): Payer: Self-pay

## 2020-07-03 ENCOUNTER — Ambulatory Visit (HOSPITAL_COMMUNITY)
Admission: RE | Admit: 2020-07-03 | Discharge: 2020-07-03 | Disposition: A | Discharge status: Home / Self Care | Payer: Medicare Other | Source: Ambulatory Visit | Attending: Adult Health | Admitting: Adult Health

## 2020-07-03 VITALS — BP 124/78 | HR 76 | Wt 228.0 lb

## 2020-07-03 DIAGNOSIS — Z7901 Long term (current) use of anticoagulants: Secondary | ICD-10-CM | POA: Insufficient documentation

## 2020-07-03 DIAGNOSIS — I2510 Atherosclerotic heart disease of native coronary artery without angina pectoris: Secondary | ICD-10-CM | POA: Insufficient documentation

## 2020-07-03 DIAGNOSIS — G4733 Obstructive sleep apnea (adult) (pediatric): Secondary | ICD-10-CM | POA: Diagnosis not present

## 2020-07-03 DIAGNOSIS — I252 Old myocardial infarction: Secondary | ICD-10-CM | POA: Diagnosis not present

## 2020-07-03 DIAGNOSIS — Z79899 Other long term (current) drug therapy: Secondary | ICD-10-CM | POA: Insufficient documentation

## 2020-07-03 DIAGNOSIS — Z951 Presence of aortocoronary bypass graft: Secondary | ICD-10-CM | POA: Insufficient documentation

## 2020-07-03 DIAGNOSIS — I480 Paroxysmal atrial fibrillation: Secondary | ICD-10-CM

## 2020-07-03 DIAGNOSIS — Z9989 Dependence on other enabling machines and devices: Secondary | ICD-10-CM | POA: Diagnosis not present

## 2020-07-03 DIAGNOSIS — I509 Heart failure, unspecified: Secondary | ICD-10-CM

## 2020-07-03 DIAGNOSIS — Z8546 Personal history of malignant neoplasm of prostate: Secondary | ICD-10-CM | POA: Diagnosis not present

## 2020-07-03 DIAGNOSIS — Z87891 Personal history of nicotine dependence: Secondary | ICD-10-CM | POA: Insufficient documentation

## 2020-07-03 DIAGNOSIS — J449 Chronic obstructive pulmonary disease, unspecified: Secondary | ICD-10-CM | POA: Diagnosis not present

## 2020-07-03 DIAGNOSIS — I110 Hypertensive heart disease with heart failure: Secondary | ICD-10-CM | POA: Insufficient documentation

## 2020-07-03 DIAGNOSIS — I255 Ischemic cardiomyopathy: Secondary | ICD-10-CM | POA: Diagnosis not present

## 2020-07-03 DIAGNOSIS — Z888 Allergy status to other drugs, medicaments and biological substances status: Secondary | ICD-10-CM | POA: Insufficient documentation

## 2020-07-03 DIAGNOSIS — Z881 Allergy status to other antibiotic agents status: Secondary | ICD-10-CM | POA: Insufficient documentation

## 2020-07-03 DIAGNOSIS — Z7982 Long term (current) use of aspirin: Secondary | ICD-10-CM | POA: Diagnosis not present

## 2020-07-03 DIAGNOSIS — I5022 Chronic systolic (congestive) heart failure: Secondary | ICD-10-CM | POA: Insufficient documentation

## 2020-07-03 LAB — T4, FREE: Free T4: 0.82 ng/dL (ref 0.61–1.12)

## 2020-07-03 LAB — HEPATIC FUNCTION PANEL
ALT: 20 U/L (ref 0–44)
AST: 20 U/L (ref 15–41)
Albumin: 3.6 g/dL (ref 3.5–5.0)
Alkaline Phosphatase: 54 U/L (ref 38–126)
Bilirubin, Direct: 0.1 mg/dL (ref 0.0–0.2)
Indirect Bilirubin: 0.6 mg/dL (ref 0.3–0.9)
Total Bilirubin: 0.7 mg/dL (ref 0.3–1.2)
Total Protein: 6.9 g/dL (ref 6.5–8.1)

## 2020-07-03 LAB — BASIC METABOLIC PANEL
Anion gap: 9 (ref 5–15)
BUN: 23 mg/dL (ref 8–23)
CO2: 22 mmol/L (ref 22–32)
Calcium: 9.1 mg/dL (ref 8.9–10.3)
Chloride: 101 mmol/L (ref 98–111)
Creatinine, Ser: 1.09 mg/dL (ref 0.61–1.24)
GFR, Estimated: >60 mL/min (ref >60)
Glucose, Bld: 138 mg/dL — ABNORMAL HIGH (ref 70–99)
Potassium: 3.9 mmol/L (ref 3.5–5.1)
Sodium: 132 mmol/L — ABNORMAL LOW (ref 135–145)

## 2020-07-03 LAB — TSH: TSH: 10.329 u[IU]/mL — ABNORMAL HIGH (ref 0.350–4.500)

## 2020-07-03 MED ORDER — AMIODARONE HCL 200 MG PO TABS
200.0000 mg | ORAL_TABLET | Freq: Every day | ORAL | 1 refills | Status: DC
Start: 2020-07-03 — End: 2020-07-31

## 2020-07-03 MED ORDER — FUROSEMIDE 80 MG PO TABS
40.0000 mg | ORAL_TABLET | Freq: Every day | ORAL | 1 refills | Status: DC | PRN
Start: 2020-07-03 — End: 2020-07-31

## 2020-07-03 NOTE — Telephone Encounter (Signed)
-----   Message from Conrad , NP sent at 07/03/2020 12:54 PM EST ----- Please call and set up labs.

## 2020-07-03 NOTE — Progress Notes (Signed)
ReDS Vest / Clip - 07/03/20 0800      ReDS Vest / Clip   Station Marker D    Ruler Value 35    ReDS Value Range Low volume    ReDS Actual Value 32    Anatomical Comments sitting

## 2020-07-03 NOTE — Patient Instructions (Signed)
DECREASE Lasix to 40 mg as needed  DECREASE Amiodarone 200 mg, one tab daily   Labs today We will only contact you if something comes back abnormal or we need to make some changes. Otherwise no news is good news!  You have been referred to [facility] -they will be in contact for an appointment  Your physician recommends that you schedule a follow-up appointment in: 3 weeks with pharmacy  Your physician recommends that you schedule a follow-up appointment in: 8 weeks with Dr Haroldine Laws  If you have any questions or concerns before your next appointment please send [place] a message through [facility] or call our office at [phone].    TO LEAVE A MESSAGE FOR THE NURSE SELECT OPTION 2, PLEASE LEAVE A MESSAGE INCLUDING: . YOUR NAME . DATE OF BIRTH . CALL BACK NUMBER . REASON FOR CALL**this is important as we prioritize the call backs  YOU WILL RECEIVE A CALL BACK THE SAME DAY AS LONG AS YOU CALL BEFORE 4:00 PM

## 2020-07-03 NOTE — Telephone Encounter (Signed)
Malena Edman, RN  07/03/2020 2:37 PM EST Back to Top    Orders added on to TSH specimen   Amy Estrella Deeds, NP  07/03/2020 12:54 PM EST     Please call and set up labs.

## 2020-07-04 LAB — T3, FREE: T3, Free: 3.1 pg/mL (ref 2.0–4.4)

## 2020-07-09 ENCOUNTER — Telehealth (HOSPITAL_COMMUNITY): Payer: Self-pay

## 2020-07-09 ENCOUNTER — Encounter (HOSPITAL_COMMUNITY): Payer: Self-pay

## 2020-07-09 NOTE — Telephone Encounter (Signed)
Attempted to call patient in regards to Cardiac Rehab - LM on VM Mailed letter 

## 2020-07-16 ENCOUNTER — Telehealth (HOSPITAL_COMMUNITY): Payer: Self-pay | Admitting: Family Medicine

## 2020-07-16 ENCOUNTER — Telehealth (HOSPITAL_COMMUNITY): Payer: Self-pay

## 2020-07-16 NOTE — Telephone Encounter (Signed)
Pt returned CR phone call, he stated he would like to use the [place] for his insurance. Adv pt he would have to contact the [place] to have auth sent over to [facility]. And once we recv'ed auth we will contact him for scheduling, patient verbalized understanding.   Placed pt ppw in blue VA folder.

## 2020-07-17 ENCOUNTER — Encounter: Payer: Medicare Other | Admitting: Family Medicine

## 2020-07-17 ENCOUNTER — Telehealth (HOSPITAL_COMMUNITY): Payer: Self-pay

## 2020-07-17 NOTE — Progress Notes (Signed)
Referring Physician: PCP: Eric Arnt, MD PCP-Cardiologist: No primary care provider on file.  HF Cardiologist: Dr. Haroldine Stokes  HPI:  [name] is a 74 year old with obesity, HTN, OSA on CPAP, HL, prostate CA, PAF, CAD s/p CABG 25/3/66 and systolic HF.   Had ECHO in 2020 that showed EF 60-65% with normal RV.  Presented to [facility] via EMS with chest pain and shortness of breath. [place] HS Trop [id number]>[id number]. Cath showed severe multivessel disease with elevated filling pressures. Echo EF 25-30% with mild AS. IABP placed. Underwent CABG 05/18/20 with Maze and LAA clipping. LIMA -> LAD, RIMA  -> PDA. Left radial OM1-> OM2. Post CABG course was slow but relatively uneventful. Discharged home on 05/30/20. - ECHO repeated 10/10 and showed EF improved 40-45% normal RV.   Recently returned to HF Clinic for follow up on 07/03/20. Overall was feeling ok. Stated he had some shortness of breath and took 80 mg oral furosemide for the next 4 days. In clinic, he was lightheaded and dizzy when standing. Mild SOB. Denied PND/Orthopnea. No chest pain. Appetite ok. No fever or chills. Taking all medications.  Today he returns to HF clinic for pharmacist medication titration. At last visit with [facility], amiodarone was decreased to 200 mg daily and furosemide 80 mg PRN was decreased to 40 mg PRN due to dizziness. Overall he is feeling well today. Dizziness has improved. Still has some dizziness, but this is normal for him. No chest pain or palpitations. Mild SOB due to lung disease. Activity level is limited by knee pain (uses cane) and asthma/COPD. He will start cardiac rehab in January. His weight at home has been stable at 227-231 lbs. He has not needed any PRN furosemide. No LEE, PND or orthopnea. His appetite is good. He is planning to switch his pharmacy from [facility] to the [facility].    HF Medications: Entresto 24/26 mg BID Spironolactone 25 mg  daily Furosemide 40 mg PRN  Has the patient been experiencing any side effects to the medications prescribed?  no  Does the patient have any problems obtaining medications due to transportation or finances?   No - now uses the [facility]  Understanding of regimen: good Understanding of indications: good Potential of compliance: good Patient understands to avoid NSAIDs. Patient understands to avoid decongestants.    Pertinent Lab Values: . Serum creatinine 1.09, BUN 23, Potassium 3.9, Sodium 132  Vital Signs: . Weight: 231.4 lbs (last clinic weight: 228 lbs) . Blood pressure: 120/66  . Heart rate: 75   Assessment: 1. CAD/NSTEMIwith critcal LM disease - LHC 9/21  with severe multivessel disease.  - s/p CABG 05/18/20 with Maze and LAA clipping. LIMA -> LAD, RIMA  -> PDA. Left radial OM1-> OM2 - continue ASA/Crestor 20 (myalgias with atorvastatin).  - Referred to cardiac rehab.  2. Chronic Systolic Heart Failure due to ischemic CM - ECHO in 2020 showed EF 60-65%. - ECHO 04/2124-30%. RV normal. Mild AS  - ECHO repeated 05/27/2020  and showed EF improved 40-45% normal RV.  - NYHA II-III. Euvolemic on exam - Continue furosemide 40 mg PRN - Consider BB at next visit. Consider bisoprolol with COPD.  - Continue Entresto 24-26 mg BID - Continue Spironolactone 25 mg daily - Start Jardiance 10 mg daily (preferred by [place]).   3. PAF  - s/p MAZE w/ LAA Clip  - Continue amiodarone 200 mg daily. Anticipate stopping amio soon with h/o COPD.   -  Discussed that he need yearly eye exams.  - continue Eliquis5 mg BID   4. H/o Prostate Cancer - continue alfuzosin   Plan: 1) Medication changes: Based on clinical presentation, vital signs and recent labs will start Jardiance 10 mg daily. 2) Follow-up: 1 month with Dr. Ballard Stokes, PharmD, BCPS, [facility], CPP Heart Failure Clinic Pharmacist [phone]

## 2020-07-17 NOTE — Telephone Encounter (Signed)
Called and spoke with pt daughter Eric Stokes, Eric Stokes her pt stated he would like to use his VA to cover CR. Adv pt would need to contact the [place] for auth and where [facility] was with scheduling and that it changing's dailey. She verbalized understanding.

## 2020-07-20 ENCOUNTER — Other Ambulatory Visit: Payer: Self-pay | Admitting: Physician Assistant

## 2020-07-25 ENCOUNTER — Other Ambulatory Visit (HOSPITAL_COMMUNITY): Payer: Self-pay | Admitting: Internal Medicine

## 2020-07-25 ENCOUNTER — Other Ambulatory Visit: Payer: Self-pay | Admitting: Physician Assistant

## 2020-07-27 ENCOUNTER — Other Ambulatory Visit (HOSPITAL_COMMUNITY): Payer: Self-pay | Admitting: Internal Medicine

## 2020-07-31 ENCOUNTER — Telehealth (HOSPITAL_COMMUNITY): Payer: Self-pay

## 2020-07-31 ENCOUNTER — Ambulatory Visit (HOSPITAL_COMMUNITY)
Admission: RE | Admit: 2020-07-31 | Discharge: 2020-07-31 | Disposition: A | Discharge status: Home / Self Care | Payer: No Typology Code available for payment source | Source: Ambulatory Visit | Attending: Cardiology | Admitting: Cardiology

## 2020-07-31 ENCOUNTER — Other Ambulatory Visit: Payer: Self-pay

## 2020-07-31 VITALS — BP 120/66 | HR 75 | Wt 231.4 lb

## 2020-07-31 DIAGNOSIS — I252 Old myocardial infarction: Secondary | ICD-10-CM | POA: Insufficient documentation

## 2020-07-31 DIAGNOSIS — I480 Paroxysmal atrial fibrillation: Secondary | ICD-10-CM | POA: Insufficient documentation

## 2020-07-31 DIAGNOSIS — Z79899 Other long term (current) drug therapy: Secondary | ICD-10-CM | POA: Diagnosis not present

## 2020-07-31 DIAGNOSIS — Z951 Presence of aortocoronary bypass graft: Secondary | ICD-10-CM | POA: Diagnosis not present

## 2020-07-31 DIAGNOSIS — J449 Chronic obstructive pulmonary disease, unspecified: Secondary | ICD-10-CM | POA: Insufficient documentation

## 2020-07-31 DIAGNOSIS — Z9989 Dependence on other enabling machines and devices: Secondary | ICD-10-CM | POA: Diagnosis not present

## 2020-07-31 DIAGNOSIS — I255 Ischemic cardiomyopathy: Secondary | ICD-10-CM | POA: Diagnosis not present

## 2020-07-31 DIAGNOSIS — G4733 Obstructive sleep apnea (adult) (pediatric): Secondary | ICD-10-CM | POA: Diagnosis not present

## 2020-07-31 DIAGNOSIS — I5022 Chronic systolic (congestive) heart failure: Secondary | ICD-10-CM | POA: Diagnosis not present

## 2020-07-31 DIAGNOSIS — I2510 Atherosclerotic heart disease of native coronary artery without angina pectoris: Secondary | ICD-10-CM | POA: Insufficient documentation

## 2020-07-31 DIAGNOSIS — Z8546 Personal history of malignant neoplasm of prostate: Secondary | ICD-10-CM | POA: Insufficient documentation

## 2020-07-31 MED ORDER — AMIODARONE HCL 200 MG PO TABS
200.0000 mg | ORAL_TABLET | Freq: Every day | ORAL | 3 refills | Status: DC
Start: 2020-07-31 — End: 2020-08-20

## 2020-07-31 MED ORDER — ROSUVASTATIN CALCIUM 20 MG PO TABS: 20.0000 mg | ORAL_TABLET | Freq: Every day | ORAL | 3 refills | Status: AC

## 2020-07-31 MED ORDER — EMPAGLIFLOZIN 10 MG PO TABS: 10.0000 mg | ORAL_TABLET | Freq: Every day | ORAL | 3 refills | Status: DC

## 2020-07-31 MED ORDER — APIXABAN 5 MG PO TABS
5.0000 mg | ORAL_TABLET | Freq: Two times a day (BID) | ORAL | 3 refills | Status: DC
Start: 2020-07-31 — End: 2021-09-26

## 2020-07-31 MED ORDER — FUROSEMIDE 80 MG PO TABS
40.0000 mg | ORAL_TABLET | Freq: Every day | ORAL | 3 refills | Status: DC | PRN
Start: 2020-07-31 — End: 2021-09-26

## 2020-07-31 MED ORDER — ENTRESTO 24-26 MG PO TABS
1.0000 | ORAL_TABLET | Freq: Two times a day (BID) | ORAL | 3 refills | Status: DC
Start: 2020-07-31 — End: 2021-06-12

## 2020-07-31 MED ORDER — SPIRONOLACTONE 25 MG PO TABS
25.0000 mg | ORAL_TABLET | Freq: Every day | ORAL | 3 refills | Status: DC
Start: 2020-07-31 — End: 2020-08-20

## 2020-07-31 MED ORDER — ASPIRIN 81 MG PO TBEC
81.0000 mg | DELAYED_RELEASE_TABLET | Freq: Every day | ORAL | 3 refills | Status: DC
Start: 2020-07-31 — End: 2021-02-25

## 2020-07-31 NOTE — Telephone Encounter (Signed)
Called patient to see if he was interested in participating in the Cardiac Rehab Program. Patient stated yes. Patient will come in for orientation on 08/21/2020 @ 9AM and will attend the 145PM exercise class.  Tourist information centre manager.

## 2020-07-31 NOTE — Patient Instructions (Addendum)
It was a pleasure seeing you today!  MEDICATIONS: -We are changing your medications today -Start  Jardiance 10 mg (1 tablet) daily. This medication is similar to [place].  -Call if you have questions about your medications.   NEXT APPOINTMENT: Return to clinic in 1 month with Dr. Haroldine Laws.  In general, to take care of your heart failure: -Limit your fluid intake to 2 Liters (half-gallon) per day.   -Limit your salt intake to ideally 2-3 grams (2000-3000 mg) per day. -Weigh yourself daily and record, and bring that "weight diary" to your next appointment.  (Weight gain of 2-3 pounds in 1 day typically means fluid weight.) -The medications for your heart are to help your heart and help you live longer.   -Please contact us before stopping any of your heart medications.  Call the clinic at [phone] with questions or to reschedule future appointments.

## 2020-08-01 ENCOUNTER — Other Ambulatory Visit: Payer: Self-pay | Admitting: Family Medicine

## 2020-08-13 ENCOUNTER — Telehealth (HOSPITAL_COMMUNITY): Payer: Self-pay

## 2020-08-15 ENCOUNTER — Encounter (HOSPITAL_COMMUNITY): Payer: Self-pay | Admitting: *Deleted

## 2020-08-15 ENCOUNTER — Other Ambulatory Visit: Payer: Self-pay | Admitting: Family Medicine

## 2020-08-15 NOTE — Progress Notes (Signed)
Completed health history on telephone with patient and his wife Consuella Lose and given reminder of orientation appointment for CR. We discussed wearing mask, proper shoes, directions to the department, Covid symptoms,and our phone number. They voice understanding.

## 2020-08-16 NOTE — Telephone Encounter (Signed)
Cardiac Rehab Medication Review by a Pharmacist  Does the patient feel that his/her medications are working for him/her?  yes  Has the patient been experiencing any side effects to the medications prescribed?  yes  Does the patient measure his/her own blood pressure or blood glucose at home?  yes - the patient reports taking their blood pressure on occasion, with the last reading being one week ago that was 122/78 with a pulse of 70.  Does the patient have any problems obtaining medications due to transportation or finances?   no  Understanding of regimen: good Understanding of indications: good Potential of compliance: good  Pharmacist Intervention: I noticed empagliflozin was on the patient's medication list, but the patient stated that they do not think they have been taking it. From the heart failure clinic note on 12/14, it looks like the patient was newly prescribed the medication and that it was sent to the [place], [place]. The patient just received their mail order shipment from the [place] and stated that they were going to search through the package to find the new medication and start taking it. The patient denied counseling on empagliflozin.  The patient also stated that they do not take their blood pressure often because they do not feel like it is too high or low. I counseled the patient on taking their blood pressure twice per week, even though they may not feel like it is out of range. The patient verbalized understanding.  Sanda Klein, PharmD, RPh  PGY-1 Pharmacy Resident 08/16/2020 4:16 PM

## 2020-08-19 ENCOUNTER — Other Ambulatory Visit (HOSPITAL_COMMUNITY): Payer: Self-pay | Admitting: Internal Medicine

## 2020-08-19 ENCOUNTER — Other Ambulatory Visit (HOSPITAL_COMMUNITY): Payer: Self-pay | Admitting: Adult Health

## 2020-08-20 ENCOUNTER — Telehealth (HOSPITAL_COMMUNITY): Payer: Self-pay

## 2020-08-21 ENCOUNTER — Ambulatory Visit (HOSPITAL_COMMUNITY): Payer: No Typology Code available for payment source

## 2020-08-23 ENCOUNTER — Other Ambulatory Visit: Payer: Self-pay

## 2020-08-23 ENCOUNTER — Encounter (HOSPITAL_COMMUNITY)
Admission: RE | Admit: 2020-08-23 | Discharge: 2020-08-23 | Disposition: A | Discharge status: Home / Self Care | Payer: No Typology Code available for payment source | Source: Ambulatory Visit | Attending: Internal Medicine | Admitting: Internal Medicine

## 2020-08-23 ENCOUNTER — Encounter (HOSPITAL_COMMUNITY): Payer: Self-pay

## 2020-08-23 VITALS — BP 118/56 | Ht 67.25 in | Wt 227.1 lb

## 2020-08-23 DIAGNOSIS — Z951 Presence of aortocoronary bypass graft: Secondary | ICD-10-CM | POA: Insufficient documentation

## 2020-08-23 DIAGNOSIS — I214 Non-ST elevation (NSTEMI) myocardial infarction: Secondary | ICD-10-CM

## 2020-08-23 HISTORY — DX: Atherosclerotic heart disease of native coronary artery without angina pectoris: I25.10

## 2020-08-23 HISTORY — DX: Hyperlipidemia, unspecified: E78.5

## 2020-08-24 ENCOUNTER — Encounter (HOSPITAL_COMMUNITY): Payer: Self-pay

## 2020-08-24 NOTE — Progress Notes (Signed)
Cardiac Individual Treatment Plan  Patient Details  Name: ALRIC CAPLEY MRN: [id number] Date of Birth: Feb 22, 1946 Referring Provider:   Flowsheet Row CARDIAC REHAB PHASE II ORIENTATION from 08/23/2020 in [place]  Referring Provider Dr Kristopher Oppenheim PA [facility]/ Dr Fransico Him MD, Covering      Initial Encounter Date:  [facility] from 08/23/2020 in [place]  Date 08/23/20      Visit Diagnosis: NSTEMI (non-ST elevated myocardial infarction) ([place]) 05/16/20  S/P CABG x 4, [facility] Procedure 05/18/2020  Patient's Home Medications on Admission:  Current Outpatient Medications:  .  alfuzosin (UROXATRAL) 10 MG 24 hr tablet, Take 10 mg by mouth daily with breakfast., Disp: , Rfl:  .  amiodarone (PACERONE) 200 MG tablet, TAKE 1 TABLET BY MOUTH EVERY DAY, Disp: 30 tablet, Rfl: 1 .  apixaban (ELIQUIS) 5 MG TABS tablet, Take 1 tablet (5 mg total) by mouth 2 (two) times daily., Disp: 180 tablet, Rfl: 3 .  aspirin 81 MG EC tablet, Take 1 tablet (81 mg total) by mouth daily. Swallow whole., Disp: 90 tablet, Rfl: 3 .  carboxymethylcellulose (REFRESH PLUS) 0.5 % SOLN, Place 1 drop into both eyes 2 (two) times daily as needed (dry eyes)., Disp: , Rfl:  .  cetirizine (ZYRTEC) 10 MG tablet, Take 10 mg by mouth daily., Disp: , Rfl:  .  Cholecalciferol (VITAMIN D3) 2000 UNITS TABS, Take 2,000 Int'l Units by mouth daily. , Disp: , Rfl:  .  empagliflozin (JARDIANCE) 10 MG TABS tablet, Take 1 tablet (10 mg total) by mouth daily., Disp: 90 tablet, Rfl: 3 .  finasteride (PROSCAR) 5 MG tablet, Take 5 mg by mouth daily., Disp: , Rfl:  .  fluticasone (FLONASE) 50 MCG/ACT nasal spray, USE TWO SPRAYS IN EACH NOSTRIL DAILY AS NEEDED. (Patient taking differently: Place 2 sprays into both nostrils daily as needed for allergies.), Disp: 48 mL, Rfl: 0 .  furosemide (LASIX) 80 MG tablet, Take 0.5 tablets (40 mg  total) by mouth daily as needed. (Patient taking differently: Take 40 mg by mouth daily as needed for fluid or edema.), Disp: 45 tablet, Rfl: 3 .  latanoprost (XALATAN) 0.005 % ophthalmic solution, INSTILL 1 DROP INTO BOTH EYES AT BEDTIME, Disp: 7.5 mL, Rfl: 0 .  montelukast (SINGULAIR) 10 MG tablet, TAKE 1 TABLET BY MOUTH EVERY DAY AT BEDTIME, Disp: 90 tablet, Rfl: 3 .  NON FORMULARY, Cpap at night, Disp: , Rfl:  .  rosuvastatin (CRESTOR) 20 MG tablet, Take 1 tablet (20 mg total) by mouth daily., Disp: 90 tablet, Rfl: 3 .  sacubitril-valsartan (ENTRESTO) 24-26 MG, Take 1 tablet by mouth 2 (two) times daily., Disp: 180 tablet, Rfl: 3 .  spironolactone (ALDACTONE) 25 MG tablet, TAKE 1 TABLET BY MOUTH EVERY DAY, Disp: 30 tablet, Rfl: 1  Past Medical History: Past Medical History:  Diagnosis Date  . Aortic stenosis, mild    noted on ECHO  . Asthma   . Benign essential hypertension, on Norvasc and Amlodipine 12/21/2008   Followed by [facility]   . BPH (benign prostatic hyperplasia) 09/03/2012  . Carotid bruit present 06/15/2018   2015 Carotid Dopplers:  Essentially normal carotid arteries, with very slight hard plaque in the proximal ICA's and serpentine distal vessels. 1-39% bilateral ICA stenosis. Patent vertebral arteries with antegrade flow. Normal subclavian arteries, bilaterally.  . Colonic polyp 01/09/2020  . COPD (chronic obstructive pulmonary disease) ([place])   . Coronary  artery disease   . Dyslipidemia 12/21/2008   Qualifier: Diagnosis of  By: Percival Spanish, MD, Farrel Gordon    . Essential hypertension, benign 12/21/2008   Qualifier: Diagnosis of  By: Percival Spanish, MD, Farrel Gordon    . Former smoker, stopped smoking in distant past 03/15/2018  . Hematuria 09/16/2011  . History of migraine   . Hyperlipidemia   . OA (osteoarthritis)   . Obese   . OSA on CPAP 09/25/2015   Diagnosed at the [facility] 2016   . Prostate cancer ([place]) 11/30/2019  . PVC's (premature ventricular contractions)    . Seasonal allergies   . Sinus bradycardia   . Squamous cell carcinoma of skin   . TIA (transient ischemic attack)    questionable  . Vitamin B 12 deficiency   . Vitamin D deficiency 11/26/2010    Tobacco Use: Social History   Tobacco Use  Smoking Status Former Smoker  . Types: Cigarettes  . Quit date: 08/18/1965  . Years since quitting: 55.0  Smokeless Tobacco Never Used  Tobacco Comment   pt quit smoking 53 yrs ago    Labs: Recent Review Flowsheet Data    Labs for ITP Cardiac and Pulmonary Rehab Latest Ref Rng & Units 05/26/2020 05/27/2020 05/28/2020 05/29/2020 05/30/2020   Cholestrol 0 - 200 mg/dL - - - - -   LDLCALC 0 - 99 mg/dL - - - - -   LDLDIRECT <100 mg/dL - - - - -   HDL >40 mg/dL - - - - -   Trlycerides <150 mg/dL - - - - -   Hemoglobin A1c 4.8 - 5.6 % - - - - -   PHART 7.350 - 7.450 - - - - -   PCO2ART 32.0 - 48.0 mmHg - - - - -   HCO3 20.0 - 28.0 mmol/L - - - - -   TCO2 22 - 32 mmol/L - - - - -   ACIDBASEDEF 0.0 - 2.0 mmol/L - - - - -   O2SAT % 59.6 71.1 62.8 73.8 72.4      Capillary Blood Glucose: Lab Results  Component Value Date   GLUCAP 131 (H) 05/30/2020   GLUCAP 122 (H) 05/30/2020   GLUCAP 106 (H) 05/30/2020   GLUCAP 93 05/30/2020   GLUCAP 121 (H) 05/29/2020     Exercise Target Goals: Exercise Program Goal: Individual exercise prescription set using results from initial 6 min walk test and THRR while considering  patient's activity barriers and safety.   Exercise Prescription Goal: Starting with aerobic activity 30 plus minutes a day, 3 days per week for initial exercise prescription. Provide home exercise prescription and guidelines that participant acknowledges understanding prior to discharge.  Activity Barriers & Risk Stratification:  Activity Barriers & Cardiac Risk Stratification - 08/23/20 1543      Activity Barriers & Cardiac Risk Stratification   Activity Barriers Back Problems;Right Knee Replacement;Joint  Problems;Deconditioning;Muscular Weakness;Shortness of Breath;Balance Concerns;Assistive Device    Cardiac Risk Stratification High           6 Minute Walk:  6 Minute Walk    Row Name 08/23/20 1449         6 Minute Walk   Distance 812 feet     Walk Time 6 minutes     # of Rest Breaks 0     MPH 1.54     METS 1.12     RPE 11     Perceived Dyspnea  1     VO2  Peak 3.91     Symptoms Yes (comment)     Comments Knee pain 3/10, SOB RPD 1     Resting HR 76 bpm     Resting BP 118/56     Resting Oxygen Saturation  98 %     Exercise Oxygen Saturation  during 6 min walk 97 %     Max Ex. HR 83 bpm     Max Ex. BP 124/70     2 Minute Post BP 120/68            Oxygen Initial Assessment:   Oxygen Re-Evaluation:   Oxygen Discharge (Final Oxygen Re-Evaluation):   Initial Exercise Prescription:  Initial Exercise Prescription - 08/23/20 1500      Date of Initial Exercise RX and Referring Provider   Date 08/23/20    Referring Provider Dr Kristopher Oppenheim PA [facility]/ Dr Fransico Him MD, Covering    Expected Discharge Date 10/19/20      NuStep   Level 1    SPM 85    Minutes 30    METs 1.6      Prescription Details   Frequency (times per week) 3    Duration Progress to 30 minutes of continuous aerobic without signs/symptoms of physical distress      Intensity   THRR 40-80% of Max Heartrate 58-116    Ratings of Perceived Exertion 11-13    Perceived Dyspnea 0-4      Progression   Progression Continue progressive overload as per policy without signs/symptoms or physical distress.      Resistance Training   Training Prescription Yes    Weight 3    Reps 10-15           Perform Capillary Blood Glucose checks as needed.  Exercise Prescription Changes:   Exercise Comments:   Exercise Goals and Review:   Exercise Goals    Row Name 08/23/20 1548             Exercise Goals   Increase Physical Activity Yes       Intervention Provide advice,  education, support and counseling about physical activity/exercise needs.;Develop an individualized exercise prescription for aerobic and resistive training based on initial evaluation findings, risk stratification, comorbidities and participant's personal goals.       Expected Outcomes Short Term: Attend rehab on a regular basis to increase amount of physical activity.;Long Term: Add in home exercise to make exercise part of routine and to increase amount of physical activity.;Long Term: Exercising regularly at least 3-5 days a week.       Increase Strength and Stamina Yes       Able to understand and use rate of perceived exertion (RPE) scale Yes       Intervention Provide education and explanation on how to use RPE scale       Expected Outcomes Short Term: Able to use RPE daily in rehab to express subjective intensity level;Long Term:  Able to use RPE to guide intensity level when exercising independently       Able to understand and use Dyspnea scale Yes       Intervention Provide education and explanation on how to use Dyspnea scale       Expected Outcomes Short Term: Able to use Dyspnea scale daily in rehab to express subjective sense of shortness of breath during exertion;Long Term: Able to use Dyspnea scale to guide intensity level when exercising independently       Knowledge and understanding of  Target Heart Rate Range (THRR) Yes       Intervention Provide education and explanation of THRR including how the numbers were predicted and where they are located for reference       Expected Outcomes Short Term: Able to state/look up THRR;Short Term: Able to use daily as guideline for intensity in rehab;Long Term: Able to use THRR to govern intensity when exercising independently       Understanding of Exercise Prescription Yes       Intervention Provide education, explanation, and written materials on patient's individual exercise prescription       Expected Outcomes Short Term: Able to explain  program exercise prescription;Long Term: Able to explain home exercise prescription to exercise independently              Exercise Goals Re-Evaluation :    Discharge Exercise Prescription (Final Exercise Prescription Changes):   Nutrition:  Target Goals: Understanding of nutrition guidelines, daily intake of sodium 1500mg , cholesterol 200mg , calories 30% from fat and 7% or less from saturated fats, daily to have 5 or more servings of fruits and vegetables.  Biometrics:  Pre Biometrics - 08/23/20 1550      Pre Biometrics   Height 5' 7.25" (1.708 m)    Weight 103 kg    Waist Circumference 47.5 inches    Hip Circumference 47 inches    Waist to Hip Ratio 1.01 %    BMI (Calculated) 35.31    Triceps Skinfold 14 mm    % Body Fat 34 %    Grip Strength 50 kg    Flexibility 15 in    Single Leg Stand --   Not done. Pt uses cane           Nutrition Therapy Plan and Nutrition Goals:   Nutrition Assessments:  MEDIFICTS Score Key:  ?70 Need to make dietary changes   40-70 Heart Healthy Diet  ? 40 Therapeutic Level Cholesterol Diet   Picture Your Plate Scores:  [id number] Unhealthy dietary pattern with much room for improvement.  41-50 Dietary pattern unlikely to meet recommendations for good health and room for improvement.  51-60 More healthful dietary pattern, with some room for improvement.   >60 Healthy dietary pattern, although there may be some specific behaviors that could be improved.    Nutrition Goals Re-Evaluation:   Nutrition Goals Discharge (Final Nutrition Goals Re-Evaluation):   Psychosocial: Target Goals: Acknowledge presence or absence of significant depression and/or stress, maximize coping skills, provide positive support system. Participant is able to verbalize types and ability to use techniques and skills needed for reducing stress and depression.  Initial Review & Psychosocial Screening:  Initial Psych Review & Screening - 08/24/20 0940       Screening Interventions   Interventions Encouraged to exercise;Provide feedback about the scores to participant    Expected Outcomes Long Term Goal: Stressors or current issues are controlled or eliminated.;Short Term goal: Identification and review with participant of any Quality of Life or Depression concerns found by scoring the questionnaire.           Quality of Life Scores:  Quality of Life - 08/23/20 1553      Quality of Life   Select Quality of Life      Quality of Life Scores   Health/Function Pre 11.08 %    Socioeconomic Pre 18.33 %    Psych/Spiritual Pre 16.58 %    Family Pre 26.38 %    GLOBAL Pre 15.83 %  Scores of 19 and below usually indicate a poorer quality of life in these areas.  A difference of  2-3 points is a clinically meaningful difference.  A difference of 2-3 points in the total score of the Quality of Life Index has been associated with significant improvement in overall quality of life, self-image, physical symptoms, and general health in studies assessing change in quality of life.  PHQ-9: Recent Review Flowsheet Data    Depression screen [facility] 2/9 08/23/2020 03/05/2020 04/12/2019 07/28/2018 03/15/2018   Decreased Interest 0 0 0 1 0   Down, Depressed, Hopeless 0 0 0 0 0   PHQ - 2 Score 0 0 0 1 0   Altered sleeping 3 - - 3 3   Tired, decreased energy 2 - - 3 3   Change in appetite 0 - - 1 1   Feeling bad or failure about yourself  0 - - 0 0   Trouble concentrating 0 - - 0 0   Moving slowly or fidgety/restless 0 - - 0 0   Suicidal thoughts 0 - - 0 0   PHQ-9 Score 5 - - 8 7   Difficult doing work/chores Not difficult at all - - Not difficult at all Not difficult at all     Interpretation of Total Score  Total Score Depression Severity:  1-4 = Minimal depression, 5-9 = Mild depression, 10-14 = Moderate depression, 15-19 = Moderately severe depression, 20-27 = Severe depression   Psychosocial Evaluation and Intervention:   Psychosocial  Re-Evaluation:   Psychosocial Discharge (Final Psychosocial Re-Evaluation):   Vocational Rehabilitation: Provide vocational rehab assistance to qualifying candidates.   Vocational Rehab Evaluation & Intervention:  Vocational Rehab - 08/24/20 0940      Initial Vocational Rehab Evaluation & Intervention   Assessment shows need for Vocational Rehabilitation No   Ramelo is retired and does not need vocational rehab at this time          Education: Education Goals: Education classes will be provided on a weekly basis, covering required topics. Participant will state understanding/return demonstration of topics presented.  Learning Barriers/Preferences:  Learning Barriers/Preferences - 08/23/20 1555      Learning Barriers/Preferences   Learning Barriers Sight;Hearing   wears glasses and hearing aides   Learning Preferences Written Material;Video;Verbal Instruction;Audio;Computer/Internet;Group Instruction;Individual Instruction;Pictoral;Skilled Demonstration           Education Topics: Hypertension, Hypertension Reduction -Define heart disease and high blood pressure. Discus how high blood pressure affects the body and ways to reduce high blood pressure.   Exercise and Your Heart -Discuss why it is important to exercise, the FITT principles of exercise, normal and abnormal responses to exercise, and how to exercise safely.   Angina -Discuss definition of angina, causes of angina, treatment of angina, and how to decrease risk of having angina.   Cardiac Medications -Review what the following cardiac medications are used for, how they affect the body, and side effects that may occur when taking the medications.  Medications include Aspirin, Beta blockers, calcium channel blockers, ACE Inhibitors, angiotensin receptor blockers, diuretics, digoxin, and antihyperlipidemics.   Congestive Heart Failure -Discuss the definition of CHF, how to live with CHF, the signs and symptoms  of CHF, and how keep track of weight and sodium intake.   Heart Disease and Intimacy -Discus the effect sexual activity has on the heart, how changes occur during intimacy as we age, and safety during sexual activity.   Smoking Cessation / COPD -Discuss different methods to quit  smoking, the health benefits of quitting smoking, and the definition of COPD.   Nutrition I: Fats -Discuss the types of cholesterol, what cholesterol does to the heart, and how cholesterol levels can be controlled.   Nutrition II: Labels -Discuss the different components of food labels and how to read food label   Heart Parts/Heart Disease and PAD -Discuss the anatomy of the heart, the pathway of blood circulation through the heart, and these are affected by heart disease.   Stress I: Signs and Symptoms -Discuss the causes of stress, how stress may lead to anxiety and depression, and ways to limit stress.   Stress II: Relaxation -Discuss different types of relaxation techniques to limit stress.   Warning Signs of Stroke / TIA -Discuss definition of a stroke, what the signs and symptoms are of a stroke, and how to identify when someone is having stroke.   Knowledge Questionnaire Score:  Knowledge Questionnaire Score - 08/23/20 1557      Knowledge Questionnaire Score   Pre Score 21/24           Core Components/Risk Factors/Patient Goals at Admission:  Personal Goals and Risk Factors at Admission - 08/24/20 0941      Core Components/Risk Factors/Patient Goals on Admission    Weight Management Yes;Obesity;Weight Loss    Intervention Weight Management: Develop a combined nutrition and exercise program designed to reach desired caloric intake, while maintaining appropriate intake of nutrient and fiber, sodium and fats, and appropriate energy expenditure required for the weight goal.;Weight Management: Provide education and appropriate resources to help participant work on and attain dietary  goals.;Weight Management/Obesity: Establish reasonable short term and long term weight goals.;Obesity: Provide education and appropriate resources to help participant work on and attain dietary goals.    Admit Weight 227 lb 1.2 oz (103 kg)    Expected Outcomes Short Term: Continue to assess and modify interventions until short term weight is achieved;Long Term: Adherence to nutrition and physical activity/exercise program aimed toward attainment of established weight goal;Weight Maintenance: Understanding of the daily nutrition guidelines, which includes 25-35% calories from fat, 7% or less cal from saturated fats, less than 200mg  cholesterol, less than 1.5gm of sodium, & 5 or more servings of fruits and vegetables daily;Weight Loss: Understanding of general recommendations for a balanced deficit meal plan, which promotes 1-2 lb weight loss per week and includes a negative energy balance of [phone] kcal/d;Understanding recommendations for meals to include 15-35% energy as protein, 25-35% energy from fat, 35-60% energy from carbohydrates, less than 200mg  of dietary cholesterol, 20-35 gm of total fiber daily;Understanding of distribution of calorie intake throughout the day with the consumption of 4-5 meals/snacks    Heart Failure Yes    Intervention Provide a combined exercise and nutrition program that is supplemented with education, support and counseling about heart failure. Directed toward relieving symptoms such as shortness of breath, decreased exercise tolerance, and extremity edema.    Expected Outcomes Short term: Attendance in program 2-3 days a week with increased exercise capacity. Reported lower sodium intake. Reported increased fruit and vegetable intake. Reports medication compliance.;Short term: Daily weights obtained and reported for increase. Utilizing diuretic protocols set by physician.;Long term: Adoption of self-care skills and reduction of barriers for early signs and symptoms recognition  and intervention leading to self-care maintenance.    Hypertension Yes    Intervention Provide education on lifestyle modifcations including regular physical activity/exercise, weight management, moderate sodium restriction and increased consumption of fresh fruit, vegetables, and low fat dairy, alcohol  moderation, and smoking cessation.;Monitor prescription use compliance.    Expected Outcomes Short Term: Continued assessment and intervention until BP is < 140/65mm HG in hypertensive participants. < 130/96mm HG in hypertensive participants with diabetes, heart failure or chronic kidney disease.;Long Term: Maintenance of blood pressure at goal levels.    Lipids Yes    Intervention Provide education and support for participant on nutrition & aerobic/resistive exercise along with prescribed medications to achieve LDL 70mg , HDL >40mg .    Expected Outcomes Short Term: Participant states understanding of desired cholesterol values and is compliant with medications prescribed. Participant is following exercise prescription and nutrition guidelines.;Long Term: Cholesterol controlled with medications as prescribed, with individualized exercise RX and with personalized nutrition plan. Value goals: LDL < 70mg , HDL > 40 mg.           Core Components/Risk Factors/Patient Goals Review:    Core Components/Risk Factors/Patient Goals at Discharge (Final Review):    ITP Comments:  ITP Comments    Row Name 08/24/20 0931           ITP Comments Dr Fransico Him MD, Medical Director              Comments: Marcello Moores attended orientation on 08/23/20 to review rules and guidelines for program.  Completed 6 minute walk test, Intitial ITP, and exercise prescription.  VSS. Telemetry-Sinus Rhythm .Theo reported having 3/10 knee pain and mild shortness of breath during the walk test this resolved with rest. Marcello Moores used a rollator for safety.  Safety measures and social distancing in place per CDC guidelines.Barnet Pall, RN,BSN 08/24/2020 9:58 AM

## 2020-08-27 ENCOUNTER — Other Ambulatory Visit: Payer: Self-pay

## 2020-08-27 ENCOUNTER — Encounter (HOSPITAL_COMMUNITY)
Admission: RE | Admit: 2020-08-27 | Discharge: 2020-08-27 | Disposition: A | Discharge status: Home / Self Care | Payer: No Typology Code available for payment source | Source: Ambulatory Visit | Attending: Internal Medicine | Admitting: Internal Medicine

## 2020-08-27 DIAGNOSIS — Z951 Presence of aortocoronary bypass graft: Secondary | ICD-10-CM

## 2020-08-27 DIAGNOSIS — I214 Non-ST elevation (NSTEMI) myocardial infarction: Secondary | ICD-10-CM

## 2020-08-27 NOTE — Progress Notes (Signed)
Daily Session Note  Patient Details  Name: Eric Stokes MRN: [id number] Date of Birth: Aug 21, 1945 Referring Provider:   Flowsheet Row CARDIAC REHAB PHASE II ORIENTATION from 08/23/2020 in [facility]  Referring Provider Dr Kristopher Oppenheim PA [facility]/ Dr Fransico Him MD, Covering      Encounter Date: 08/27/2020  Check In:  Session Check In - 08/27/20 1343      Check-In   Supervising physician immediately available to respond to emergencies [facility] immediately available    Physician(s) Dr Horris Latino    Location [facility]    Staff Present Lesly Rubenstein, MS, EP-C, CCRP;Other;Olinty Celesta Aver, MS, ACSM CEP, Exercise Physiologist;Rockie Schnoor Venetia Maxon, RN, BSN   Esmeralda Links EP-C, Dallie Piles, RN   Virtual Visit No    Medication changes reported     No    Fall or balance concerns reported    No    Tobacco Cessation No Change    Current number of cigarettes/nicotine per day     0    Warm-up and Cool-down Performed on first and last piece of equipment    Resistance Training Performed Yes    VAD Patient? No    PAD/SET Patient? No      Pain Assessment   Currently in Pain? No/denies    Pain Score 0-No pain    Multiple Pain Sites No           Capillary Blood Glucose: No results found for this or any previous visit (from the past 24 hour(s)).   Exercise Prescription Changes - 08/27/20 1400      Response to Exercise   Blood Pressure (Admit) 122/58    Blood Pressure (Exercise) 116/58    Blood Pressure (Exit) 118/68    Heart Rate (Admit) 84 bpm    Heart Rate (Exercise) 82 bpm    Heart Rate (Exit) 74 bpm    Rating of Perceived Exertion (Exercise) 11    Symptoms None    Comments Pt's first day of exercise in the CRP2 program    Duration Progress to 30 minutes of  aerobic without signs/symptoms of physical distress    Intensity THRR unchanged      Progression   Progression Continue to progress workloads to  maintain intensity without signs/symptoms of physical distress.    Average METs 1.3      Resistance Training   Training Prescription Yes    Weight 3    Reps 10-15    Time 10 Minutes      Interval Training   Interval Training No      NuStep   Level 1    SPM 70    Minutes 25    METs 1.3           Social History   Tobacco Use  Smoking Status Former Smoker  . Types: Cigarettes  . Quit date: 08/18/1965  . Years since quitting: 55.0  Smokeless Tobacco Never Used  Tobacco Comment   pt quit smoking 53 yrs ago    Goals Met:  No report of cardiac concerns or symptoms Strength training completed today  Goals Unmet:  Not Applicable  Comments: Pt started cardiac rehab today.  Pt tolerated light exercise without difficulty. VSS, telemetry-Sinus Rhythm, asymptomatic.  Medication list reconciled. Pt denies barriers to medicaiton compliance.Eric Stokes reported having mild knee pain today used his cane without difficulty.  PSYCHOSOCIAL ASSESSMENT:  PHQ-5. Pt exhibits positive coping skills, hopeful outlook with supportive family. QUALITY OF LIFE  SCORE REVIEW  Pt completed Quality of Life survey as a participant in Cardiac Rehab. Scores 21.0 or below are considered low. Pt score very low in several areas Overall 15.83, Health and Function 11.08, socioeconomic 18.33, physiological and spiritual 16.58, family 26.38. Patient quality of life slightly altered by physical constraints which limits ability to perform as prior to recent cardiac illness.Eric Stokes reports that he has experienced some depression after his recent knee surgery in August and Open Heart Surgery in October.  Offered emotional support and reassurance.  Will continue to monitor and intervene as necessary.Eric Stokes that it felt good to exercise today. Eric Stokes goal is to get stronger. Eric Stokes does not want his quality of life forwarded to his primary care provider at the [place].Eric Pall, RN,BSN 08/27/2020 4:39 PM       Pt enjoys  hunting, fishing, doing yard work and his vegetable garden in the spring..   Pt oriented to exercise equipment and routine.    Understanding verbalized.Eric Pall, RN,BSN 08/27/2020 4:42 PM   Dr. Fransico Him is Medical Director for Cardiac Rehab at [facility].

## 2020-08-27 NOTE — Progress Notes (Signed)
Cardiac Individual Treatment Plan  Patient Details  Name: Eric Stokes MRN: [id number] Date of Birth: 1946/04/22 Referring Provider:   Flowsheet Row CARDIAC REHAB PHASE II ORIENTATION from 08/23/2020 in [facility]  Referring Provider Dr Kristopher Oppenheim PA [facility]/ Dr Fransico Him MD, Covering      Initial Encounter Date:  [place] from 08/23/2020 in [facility]  Date 08/23/20      Visit Diagnosis: NSTEMI (non-ST elevated myocardial infarction) ([place]) 05/16/20  S/P CABG x 4, [facility] Procedure 05/18/2020  Patient's Home Medications on Admission:  Current Outpatient Medications:    alfuzosin (UROXATRAL) 10 MG 24 hr tablet, Take 10 mg by mouth daily with breakfast., Disp: , Rfl:    amiodarone (PACERONE) 200 MG tablet, TAKE 1 TABLET BY MOUTH EVERY DAY, Disp: 30 tablet, Rfl: 1   apixaban (ELIQUIS) 5 MG TABS tablet, Take 1 tablet (5 mg total) by mouth 2 (two) times daily., Disp: 180 tablet, Rfl: 3   aspirin 81 MG EC tablet, Take 1 tablet (81 mg total) by mouth daily. Swallow whole., Disp: 90 tablet, Rfl: 3   carboxymethylcellulose (REFRESH PLUS) 0.5 % SOLN, Place 1 drop into both eyes 2 (two) times daily as needed (dry eyes)., Disp: , Rfl:    cetirizine (ZYRTEC) 10 MG tablet, Take 10 mg by mouth daily., Disp: , Rfl:    Cholecalciferol (VITAMIN D3) 2000 UNITS TABS, Take 2,000 Int'l Units by mouth daily. , Disp: , Rfl:    empagliflozin (JARDIANCE) 10 MG TABS tablet, Take 1 tablet (10 mg total) by mouth daily., Disp: 90 tablet, Rfl: 3   finasteride (PROSCAR) 5 MG tablet, Take 5 mg by mouth daily., Disp: , Rfl:    fluticasone (FLONASE) 50 MCG/ACT nasal spray, USE TWO SPRAYS IN EACH NOSTRIL DAILY AS NEEDED. (Patient taking differently: Place 2 sprays into both nostrils daily as needed for allergies.), Disp: 48 mL, Rfl: 0   furosemide (LASIX) 80 MG tablet, Take 0.5 tablets (40 mg  total) by mouth daily as needed. (Patient taking differently: Take 40 mg by mouth daily as needed for fluid or edema.), Disp: 45 tablet, Rfl: 3   latanoprost (XALATAN) 0.005 % ophthalmic solution, INSTILL 1 DROP INTO BOTH EYES AT BEDTIME, Disp: 7.5 mL, Rfl: 0   montelukast (SINGULAIR) 10 MG tablet, TAKE 1 TABLET BY MOUTH EVERY DAY AT BEDTIME, Disp: 90 tablet, Rfl: 3   NON FORMULARY, Cpap at night, Disp: , Rfl:    rosuvastatin (CRESTOR) 20 MG tablet, Take 1 tablet (20 mg total) by mouth daily., Disp: 90 tablet, Rfl: 3   sacubitril-valsartan (ENTRESTO) 24-26 MG, Take 1 tablet by mouth 2 (two) times daily., Disp: 180 tablet, Rfl: 3   spironolactone (ALDACTONE) 25 MG tablet, TAKE 1 TABLET BY MOUTH EVERY DAY, Disp: 30 tablet, Rfl: 1  Past Medical History: Past Medical History:  Diagnosis Date   Aortic stenosis, mild    noted on ECHO   Asthma    Benign essential hypertension, on Norvasc and Amlodipine 12/21/2008   Followed by [facility]    BPH (benign prostatic hyperplasia) 09/03/2012   Carotid bruit present 06/15/2018   2015 Carotid Dopplers:  Essentially normal carotid arteries, with very slight hard plaque in the proximal ICA's and serpentine distal vessels. 1-39% bilateral ICA stenosis. Patent vertebral arteries with antegrade flow. Normal subclavian arteries, bilaterally.   Colonic polyp 01/09/2020   COPD (chronic obstructive pulmonary disease) (HCC)    Coronary  artery disease    Dyslipidemia 12/21/2008   Qualifier: Diagnosis of  By: Percival Spanish, MD, Farrel Gordon     Essential hypertension, benign 12/21/2008   Qualifier: Diagnosis of  By: Percival Spanish, MD, Farrel Gordon     Former smoker, stopped smoking in distant past 03/15/2018   Hematuria 09/16/2011   History of migraine    Hyperlipidemia    OA (osteoarthritis)    Obese    OSA on CPAP 09/25/2015   Diagnosed at [facility] 2016    Prostate cancer [facility]) 11/30/2019   PVC's (premature ventricular contractions)     Seasonal allergies    Sinus bradycardia    Squamous cell carcinoma of skin    TIA (transient ischemic attack)    questionable   Vitamin B 12 deficiency    Vitamin D deficiency 11/26/2010    Tobacco Use: Social History   Tobacco Use  Smoking Status Former Smoker   Types: Cigarettes   Quit date: 08/18/1965   Years since quitting: 55.0  Smokeless Tobacco Never Used  Tobacco Comment   pt quit smoking 53 yrs ago    Labs: Recent Review Flowsheet Data    Labs for ITP Cardiac and Pulmonary Rehab Latest Ref Rng & Units 05/26/2020 05/27/2020 05/28/2020 05/29/2020 05/30/2020   Cholestrol 0 - 200 mg/dL - - - - -   LDLCALC 0 - 99 mg/dL - - - - -   LDLDIRECT <100 mg/dL - - - - -   HDL >40 mg/dL - - - - -   Trlycerides <150 mg/dL - - - - -   Hemoglobin A1c 4.8 - 5.6 % - - - - -   PHART 7.350 - 7.450 - - - - -   PCO2ART 32.0 - 48.0 mmHg - - - - -   HCO3 20.0 - 28.0 mmol/L - - - - -   TCO2 22 - 32 mmol/L - - - - -   ACIDBASEDEF 0.0 - 2.0 mmol/L - - - - -   O2SAT % 59.6 71.1 62.8 73.8 72.4      Capillary Blood Glucose: Lab Results  Component Value Date   GLUCAP 131 (H) 05/30/2020   GLUCAP 122 (H) 05/30/2020   GLUCAP 106 (H) 05/30/2020   GLUCAP 93 05/30/2020   GLUCAP 121 (H) 05/29/2020     Exercise Target Goals: Exercise Program Goal: Individual exercise prescription set using results from initial 6 min walk test and THRR while considering  patients activity barriers and safety.   Exercise Prescription Goal: Starting with aerobic activity 30 plus minutes a day, 3 days per week for initial exercise prescription. Provide home exercise prescription and guidelines that participant acknowledges understanding prior to discharge.  Activity Barriers & Risk Stratification:  Activity Barriers & Cardiac Risk Stratification - 08/23/20 1543      Activity Barriers & Cardiac Risk Stratification   Activity Barriers Back Problems;Right Knee Replacement;Joint  Problems;Deconditioning;Muscular Weakness;Shortness of Breath;Balance Concerns;Assistive Device    Cardiac Risk Stratification High           6 Minute Walk:  6 Minute Walk    Row Name 08/23/20 1449         6 Minute Walk   Distance 812 feet     Walk Time 6 minutes     # of Rest Breaks 0     MPH 1.54     METS 1.12     RPE 11     Perceived Dyspnea  1     VO2  Peak 3.91     Symptoms Yes (comment)     Comments Knee pain 3/10, SOB RPD 1     Resting HR 76 bpm     Resting BP 118/56     Resting Oxygen Saturation  98 %     Exercise Oxygen Saturation  during 6 min walk 97 %     Max Ex. HR 83 bpm     Max Ex. BP 124/70     2 Minute Post BP 120/68            Oxygen Initial Assessment:   Oxygen Re-Evaluation:   Oxygen Discharge (Final Oxygen Re-Evaluation):   Initial Exercise Prescription:  Initial Exercise Prescription - 08/23/20 1500      Date of Initial Exercise RX and Referring Provider   Date 08/23/20    Referring Provider Dr Kristopher Oppenheim PA [facility]/ Dr Fransico Him MD, Covering    Expected Discharge Date 10/19/20      NuStep   Level 1    SPM 85    Minutes 30    METs 1.6      Prescription Details   Frequency (times per week) 3    Duration Progress to 30 minutes of continuous aerobic without signs/symptoms of physical distress      Intensity   THRR 40-80% of Max Heartrate 58-116    Ratings of Perceived Exertion 11-13    Perceived Dyspnea 0-4      Progression   Progression Continue progressive overload as per policy without signs/symptoms or physical distress.      Resistance Training   Training Prescription Yes    Weight 3    Reps 10-15           Perform Capillary Blood Glucose checks as needed.  Exercise Prescription Changes:   Exercise Prescription Changes    Row Name 08/27/20 1400             Response to Exercise   Blood Pressure (Admit) 122/58       Blood Pressure (Exercise) 116/58       Blood Pressure (Exit) 118/68        Heart Rate (Admit) 84 bpm       Heart Rate (Exercise) 82 bpm       Heart Rate (Exit) 74 bpm       Rating of Perceived Exertion (Exercise) 11       Symptoms None       Comments Pt's first day of exercise in the CRP2 program       Duration Progress to 30 minutes of  aerobic without signs/symptoms of physical distress       Intensity THRR unchanged               Progression   Progression Continue to progress workloads to maintain intensity without signs/symptoms of physical distress.       Average METs 1.3               Resistance Training   Training Prescription Yes       Weight 3       Reps 10-15       Time 10 Minutes               Interval Training   Interval Training No               NuStep   Level 1       SPM 70       Minutes 25  METs 1.3              Exercise Comments:   Exercise Comments    Row Name 08/27/20 1452           Exercise Comments Pt's first day of exercise in the CRP2 program. Pt did not keep SPM on the nustep above 70 because he was afraid he would get short of breath. Will continue to work with patient to build stamina and confidence.              Exercise Goals and Review:   Exercise Goals    Row Name 08/23/20 1548             Exercise Goals   Increase Physical Activity Yes       Intervention Provide advice, education, support and counseling about physical activity/exercise needs.;Develop an individualized exercise prescription for aerobic and resistive training based on initial evaluation findings, risk stratification, comorbidities and participant's personal goals.       Expected Outcomes Short Term: Attend rehab on a regular basis to increase amount of physical activity.;Long Term: Add in home exercise to make exercise part of routine and to increase amount of physical activity.;Long Term: Exercising regularly at least 3-5 days a week.       Increase Strength and Stamina Yes       Able to understand and use rate of perceived  exertion (RPE) scale Yes       Intervention Provide education and explanation on how to use RPE scale       Expected Outcomes Short Term: Able to use RPE daily in rehab to express subjective intensity level;Long Term:  Able to use RPE to guide intensity level when exercising independently       Able to understand and use Dyspnea scale Yes       Intervention Provide education and explanation on how to use Dyspnea scale       Expected Outcomes Short Term: Able to use Dyspnea scale daily in rehab to express subjective sense of shortness of breath during exertion;Long Term: Able to use Dyspnea scale to guide intensity level when exercising independently       Knowledge and understanding of Target Heart Rate Range (THRR) Yes       Intervention Provide education and explanation of THRR including how the numbers were predicted and where they are located for reference       Expected Outcomes Short Term: Able to state/look up THRR;Short Term: Able to use daily as guideline for intensity in rehab;Long Term: Able to use THRR to govern intensity when exercising independently       Understanding of Exercise Prescription Yes       Intervention Provide education, explanation, and written materials on patient's individual exercise prescription       Expected Outcomes Short Term: Able to explain program exercise prescription;Long Term: Able to explain home exercise prescription to exercise independently              Exercise Goals Re-Evaluation :  Exercise Goals Re-Evaluation    [place] Name 08/27/20 1451             Exercise Goal Re-Evaluation   Exercise Goals Review Increase Physical Activity;Increase Strength and Stamina;Able to understand and use rate of perceived exertion (RPE) scale;Knowledge and understanding of Target Heart Rate Range (THRR);Understanding of Exercise Prescription       Comments Pt's first day of execise in the CRP2 program, Pt understnads the RPE scale, THRR,  and Exercise Rx.        Expected Outcomes Will continue to monitor patient and progress exercise workloads as tolerated.               Discharge Exercise Prescription (Final Exercise Prescription Changes):  Exercise Prescription Changes - 08/27/20 1400      Response to Exercise   Blood Pressure (Admit) 122/58    Blood Pressure (Exercise) 116/58    Blood Pressure (Exit) 118/68    Heart Rate (Admit) 84 bpm    Heart Rate (Exercise) 82 bpm    Heart Rate (Exit) 74 bpm    Rating of Perceived Exertion (Exercise) 11    Symptoms None    Comments Pt's first day of exercise in the CRP2 program    Duration Progress to 30 minutes of  aerobic without signs/symptoms of physical distress    Intensity THRR unchanged      Progression   Progression Continue to progress workloads to maintain intensity without signs/symptoms of physical distress.    Average METs 1.3      Resistance Training   Training Prescription Yes    Weight 3    Reps 10-15    Time 10 Minutes      Interval Training   Interval Training No      NuStep   Level 1    SPM 70    Minutes 25    METs 1.3           Nutrition:  Target Goals: Understanding of nutrition guidelines, daily intake of sodium 1500mg , cholesterol 200mg , calories 30% from fat and 7% or less from saturated fats, daily to have 5 or more servings of fruits and vegetables.  Biometrics:  Pre Biometrics - 08/23/20 1550      Pre Biometrics   Height 5' 7.25" (1.708 m)    Weight 103 kg    Waist Circumference 47.5 inches    Hip Circumference 47 inches    Waist to Hip Ratio 1.01 %    BMI (Calculated) 35.31    Triceps Skinfold 14 mm    % Body Fat 34 %    Grip Strength 50 kg    Flexibility 15 in    Single Leg Stand --   Not done. Pt uses cane           Nutrition Therapy Plan and Nutrition Goals:  Nutrition Therapy & Goals - 08/27/20 1415      Nutrition Therapy   RD appointment deferred Yes           Nutrition Assessments:  MEDIFICTS Score Key:  ?70 Need to  make dietary changes   40-70 Heart Healthy Diet  ? 40 Therapeutic Level Cholesterol Diet   Picture Your Plate Scores:  [id number] Unhealthy dietary pattern with much room for improvement.  41-50 Dietary pattern unlikely to meet recommendations for good health and room for improvement.  51-60 More healthful dietary pattern, with some room for improvement.   >60 Healthy dietary pattern, although there may be some specific behaviors that could be improved.    Nutrition Goals Re-Evaluation:   Nutrition Goals Discharge (Final Nutrition Goals Re-Evaluation):   Psychosocial: Target Goals: Acknowledge presence or absence of significant depression and/or stress, maximize coping skills, provide positive support system. Participant is able to verbalize types and ability to use techniques and skills needed for reducing stress and depression.  Initial Review & Psychosocial Screening:  Initial Psych Review & Screening - 08/24/20 0940      Screening Interventions  Interventions Encouraged to exercise;Provide feedback about the scores to participant    Expected Outcomes Long Term Goal: Stressors or current issues are controlled or eliminated.;Short Term goal: Identification and review with participant of any Quality of Life or Depression concerns found by scoring the questionnaire.           Quality of Life Scores:  Quality of Life - 08/23/20 1553      Quality of Life   Select Quality of Life      Quality of Life Scores   Health/Function Pre 11.08 %    Socioeconomic Pre 18.33 %    Psych/Spiritual Pre 16.58 %    Family Pre 26.38 %    GLOBAL Pre 15.83 %          Scores of 19 and below usually indicate a poorer quality of life in these areas.  A difference of  2-3 points is a clinically meaningful difference.  A difference of 2-3 points in the total score of the Quality of Life Index has been associated with significant improvement in overall quality of life, self-image, physical  symptoms, and general health in studies assessing change in quality of life.  PHQ-9: Recent Review Flowsheet Data    Depression screen [facility] 2/9 08/23/2020 03/05/2020 04/12/2019 07/28/2018 03/15/2018   Decreased Interest 0 0 0 1 0   Down, Depressed, Hopeless 0 0 0 0 0   PHQ - 2 Score 0 0 0 1 0   Altered sleeping 3 - - 3 3   Tired, decreased energy 2 - - 3 3   Change in appetite 0 - - 1 1   Feeling bad or failure about yourself  0 - - 0 0   Trouble concentrating 0 - - 0 0   Moving slowly or fidgety/restless 0 - - 0 0   Suicidal thoughts 0 - - 0 0   PHQ-9 Score 5 - - 8 7   Difficult doing work/chores Not difficult at all - - Not difficult at all Not difficult at all     Interpretation of Total Score  Total Score Depression Severity:  1-4 = Minimal depression, 5-9 = Mild depression, 10-14 = Moderate depression, 15-19 = Moderately severe depression, 20-27 = Severe depression   Psychosocial Evaluation and Intervention:   Psychosocial Re-Evaluation:  Psychosocial Re-Evaluation    Row Name 08/27/20 1627             Psychosocial Re-Evaluation   Current issues with Current Depression       Comments Reviewed quality of life scores with the patient. [place] has been through a lot having recent knee surgery in August and OHS in October       Expected Outcomes Patient will have decreased depression upon completion of phase 2 cardiac rehab.       Interventions Encouraged to attend Cardiac Rehabilitation for the exercise       Continue Psychosocial Services  No Follow up required              Psychosocial Discharge (Final Psychosocial Re-Evaluation):  Psychosocial Re-Evaluation - 08/27/20 1627      Psychosocial Re-Evaluation   Current issues with Current Depression    Comments Reviewed quality of life scores with the patient. Zylin has been through a lot having recent knee surgery in August and OHS in October    Expected Outcomes Patient will have decreased depression upon completion of  phase 2 cardiac rehab.    Interventions Encouraged to attend Cardiac Rehabilitation for  the exercise    Continue Psychosocial Services  No Follow up required           Vocational Rehabilitation: Provide vocational rehab assistance to qualifying candidates.   Vocational Rehab Evaluation & Intervention:  Vocational Rehab - 08/24/20 0940      Initial Vocational Rehab Evaluation & Intervention   Assessment shows need for Vocational Rehabilitation No   Rany is retired and does not need vocational rehab at this time          Education: Education Goals: Education classes will be provided on a weekly basis, covering required topics. Participant will state understanding/return demonstration of topics presented.  Learning Barriers/Preferences:  Learning Barriers/Preferences - 08/23/20 1555      Learning Barriers/Preferences   Learning Barriers Sight;Hearing   wears glasses and hearing aides   Learning Preferences Written Material;Video;Verbal Instruction;Audio;Computer/Internet;Group Instruction;Individual Instruction;Pictoral;Skilled Demonstration           Education Topics: Hypertension, Hypertension Reduction -Define heart disease and high blood pressure. Discus how high blood pressure affects the body and ways to reduce high blood pressure.   Exercise and Your Heart -Discuss why it is important to exercise, the FITT principles of exercise, normal and abnormal responses to exercise, and how to exercise safely.   Angina -Discuss definition of angina, causes of angina, treatment of angina, and how to decrease risk of having angina.   Cardiac Medications -Review what the following cardiac medications are used for, how they affect the body, and side effects that may occur when taking the medications.  Medications include Aspirin, Beta blockers, calcium channel blockers, ACE Inhibitors, angiotensin receptor blockers, diuretics, digoxin, and antihyperlipidemics.   Congestive  Heart Failure -Discuss the definition of CHF, how to live with CHF, the signs and symptoms of CHF, and how keep track of weight and sodium intake.   Heart Disease and Intimacy -Discus the effect sexual activity has on the heart, how changes occur during intimacy as we age, and safety during sexual activity.   Smoking Cessation / COPD -Discuss different methods to quit smoking, the health benefits of quitting smoking, and the definition of COPD.   Nutrition I: Fats -Discuss the types of cholesterol, what cholesterol does to the heart, and how cholesterol levels can be controlled.   Nutrition II: Labels -Discuss the different components of food labels and how to read food label   Heart Parts/Heart Disease and PAD -Discuss the anatomy of the heart, the pathway of blood circulation through the heart, and these are affected by heart disease.   Stress I: Signs and Symptoms -Discuss the causes of stress, how stress may lead to anxiety and depression, and ways to limit stress.   Stress II: Relaxation -Discuss different types of relaxation techniques to limit stress.   Warning Signs of Stroke / TIA -Discuss definition of a stroke, what the signs and symptoms are of a stroke, and how to identify when someone is having stroke.   Knowledge Questionnaire Score:  Knowledge Questionnaire Score - 08/23/20 1557      Knowledge Questionnaire Score   Pre Score 21/24           Core Components/Risk Factors/Patient Goals at Admission:  Personal Goals and Risk Factors at Admission - 08/24/20 0941      Core Components/Risk Factors/Patient Goals on Admission    Weight Management Yes;Obesity;Weight Loss    Intervention Weight Management: Develop a combined nutrition and exercise program designed to reach desired caloric intake, while maintaining appropriate intake of nutrient and fiber,  sodium and fats, and appropriate energy expenditure required for the weight goal.;Weight Management:  Provide education and appropriate resources to help participant work on and attain dietary goals.;Weight Management/Obesity: Establish reasonable short term and long term weight goals.;Obesity: Provide education and appropriate resources to help participant work on and attain dietary goals.    Admit Weight 227 lb 1.2 oz (103 kg)    Expected Outcomes Short Term: Continue to assess and modify interventions until short term weight is achieved;Long Term: Adherence to nutrition and physical activity/exercise program aimed toward attainment of established weight goal;Weight Maintenance: Understanding of the daily nutrition guidelines, which includes 25-35% calories from fat, 7% or less cal from saturated fats, less than 200mg  cholesterol, less than 1.5gm of sodium, & 5 or more servings of fruits and vegetables daily;Weight Loss: Understanding of general recommendations for a balanced deficit meal plan, which promotes 1-2 lb weight loss per week and includes a negative energy balance of [phone] kcal/d;Understanding recommendations for meals to include 15-35% energy as protein, 25-35% energy from fat, 35-60% energy from carbohydrates, less than 200mg  of dietary cholesterol, 20-35 gm of total fiber daily;Understanding of distribution of calorie intake throughout the day with the consumption of 4-5 meals/snacks    Heart Failure Yes    Intervention Provide a combined exercise and nutrition program that is supplemented with education, support and counseling about heart failure. Directed toward relieving symptoms such as shortness of breath, decreased exercise tolerance, and extremity edema.    Expected Outcomes Short term: Attendance in program 2-3 days a week with increased exercise capacity. Reported lower sodium intake. Reported increased fruit and vegetable intake. Reports medication compliance.;Short term: Daily weights obtained and reported for increase. Utilizing diuretic protocols set by physician.;Long term:  Adoption of self-care skills and reduction of barriers for early signs and symptoms recognition and intervention leading to self-care maintenance.    Hypertension Yes    Intervention Provide education on lifestyle modifcations including regular physical activity/exercise, weight management, moderate sodium restriction and increased consumption of fresh fruit, vegetables, and low fat dairy, alcohol moderation, and smoking cessation.;Monitor prescription use compliance.    Expected Outcomes Short Term: Continued assessment and intervention until BP is < 140/15mm HG in hypertensive participants. < 130/54mm HG in hypertensive participants with diabetes, heart failure or chronic kidney disease.;Long Term: Maintenance of blood pressure at goal levels.    Lipids Yes    Intervention Provide education and support for participant on nutrition & aerobic/resistive exercise along with prescribed medications to achieve LDL 70mg , HDL >40mg .    Expected Outcomes Short Term: Participant states understanding of desired cholesterol values and is compliant with medications prescribed. Participant is following exercise prescription and nutrition guidelines.;Long Term: Cholesterol controlled with medications as prescribed, with individualized exercise RX and with personalized nutrition plan. Value goals: LDL < 70mg , HDL > 40 mg.           Core Components/Risk Factors/Patient Goals Review:   Goals and Risk Factor Review    Row Name 08/27/20 1628 08/27/20 1629           Core Components/Risk Factors/Patient Goals Review   Personal Goals Review -- Weight Management/Obesity;Heart Failure;Hypertension;Lipids      Review Hamse did well on his first day of exercise. VSS. Patient did report some mild knee pain Kentavis did well on his first day of exercise. VSS. Patient did report some mild knee pain will continue to monitor      Expected Outcomes -- Natavion will continue to participater in phase 2 cardiac  rehab for exercise,  nutrtition and lifestyle modifications             Core Components/Risk Factors/Patient Goals at Discharge (Final Review):   Goals and Risk Factor Review - 08/27/20 1629      Core Components/Risk Factors/Patient Goals Review   Personal Goals Review Weight Management/Obesity;Heart Failure;Hypertension;Lipids    Review Kelechi did well on his first day of exercise. VSS. Patient did report some mild knee pain will continue to monitor    Expected Outcomes Qadir will continue to participater in phase 2 cardiac rehab for exercise, nutrtition and lifestyle modifications           ITP Comments:  ITP Comments    Row Name 08/24/20 0931 08/27/20 1626         ITP Comments Dr Fransico Him MD, Medical Director 30 Day ITP Reivew. Melvan did well on his first day of exercise on 08/27/20.             Comments: See ITP comments.Barnet Pall, RN,BSN 08/28/2020 3:39 PM

## 2020-08-29 ENCOUNTER — Encounter (HOSPITAL_COMMUNITY)
Admission: RE | Admit: 2020-08-29 | Discharge: 2020-08-29 | Disposition: A | Discharge status: Home / Self Care | Payer: No Typology Code available for payment source | Source: Ambulatory Visit | Attending: Internal Medicine | Admitting: Internal Medicine

## 2020-08-29 ENCOUNTER — Other Ambulatory Visit: Payer: Self-pay

## 2020-08-29 DIAGNOSIS — Z951 Presence of aortocoronary bypass graft: Secondary | ICD-10-CM

## 2020-08-29 DIAGNOSIS — I214 Non-ST elevation (NSTEMI) myocardial infarction: Secondary | ICD-10-CM

## 2020-08-30 ENCOUNTER — Other Ambulatory Visit: Payer: Self-pay | Admitting: Family Medicine

## 2020-08-30 DIAGNOSIS — R3914 Feeling of incomplete bladder emptying: Secondary | ICD-10-CM | POA: Diagnosis not present

## 2020-08-31 ENCOUNTER — Encounter (HOSPITAL_COMMUNITY)
Admission: RE | Admit: 2020-08-31 | Discharge: 2020-08-31 | Disposition: A | Discharge status: Home / Self Care | Payer: No Typology Code available for payment source | Source: Ambulatory Visit | Attending: Internal Medicine | Admitting: Internal Medicine

## 2020-08-31 ENCOUNTER — Other Ambulatory Visit: Payer: Self-pay

## 2020-08-31 DIAGNOSIS — Z951 Presence of aortocoronary bypass graft: Secondary | ICD-10-CM | POA: Diagnosis not present

## 2020-08-31 DIAGNOSIS — I214 Non-ST elevation (NSTEMI) myocardial infarction: Secondary | ICD-10-CM

## 2020-08-31 IMAGING — CT CT HEAD WITHOUT CONTRAST
4 series · 16 of 47 positions shown, 18 images · non-contrast
Comparison: None.

CLINICAL DATA: Headache, dizziness

EXAM:
CT HEAD WITHOUT CONTRAST
TECHNIQUE: Contiguous axial images were obtained from the base of the skull
through the vertex without intravenous contrast.

[Series 3: head without · axial · non-contrast · 0.43mm/px · z∈[-95,+25]mm · 7 of 34 slices shown, 9 images]
[im 5/34  brain]
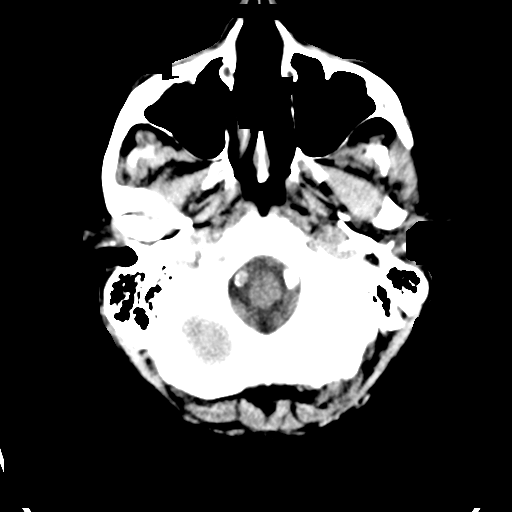
[im 5/34  bone]
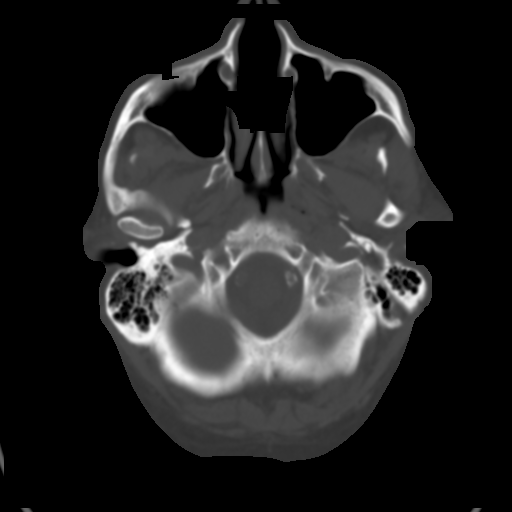
[im 9/34  brain]
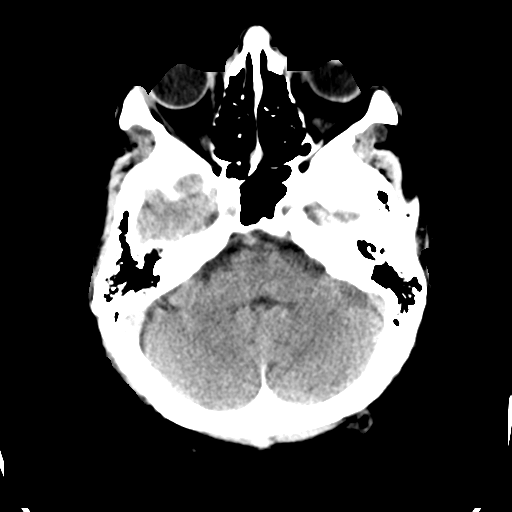
[im 13/34  brain]
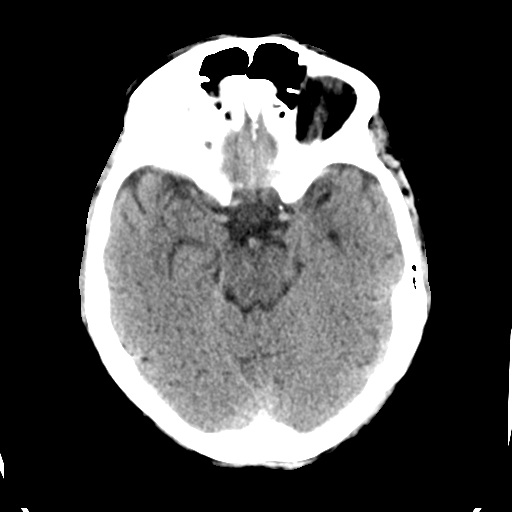
[im 17/34  brain]
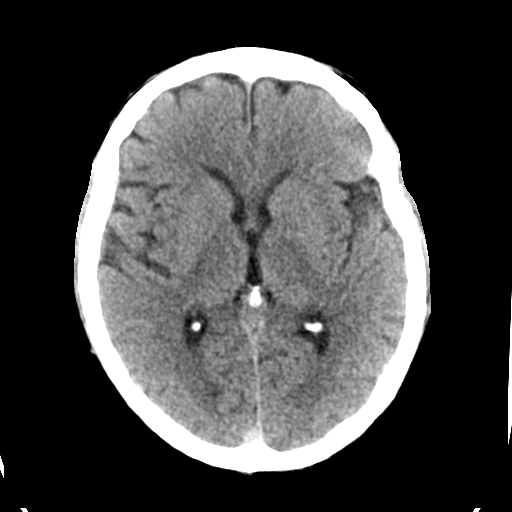
[im 21/34  brain]
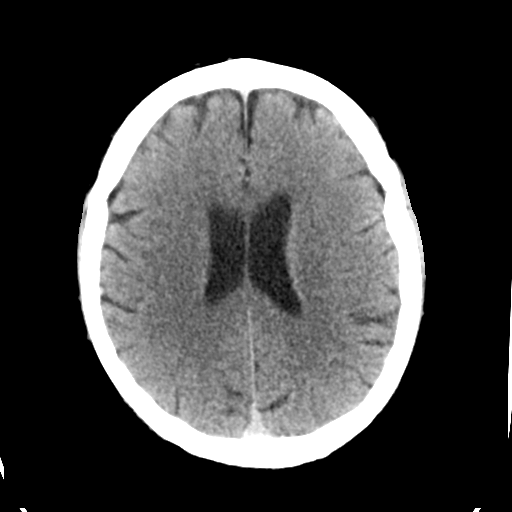
[im 21/34  bone]
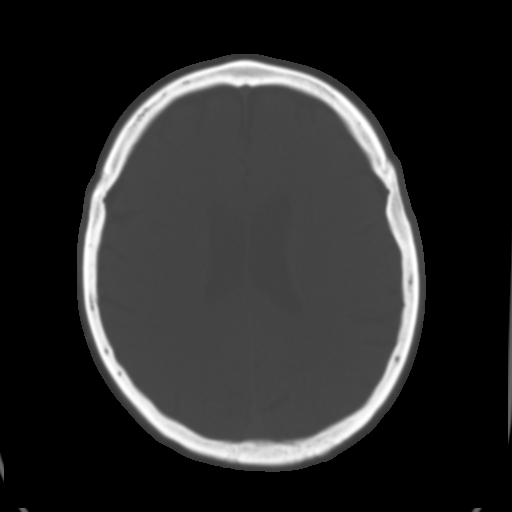
[im 25/34  brain]
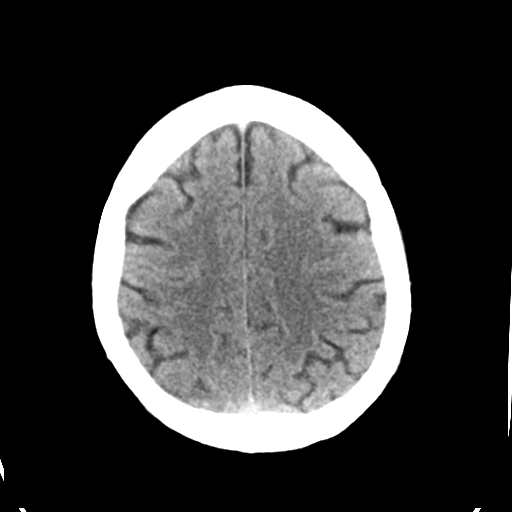
[im 29/34  brain]
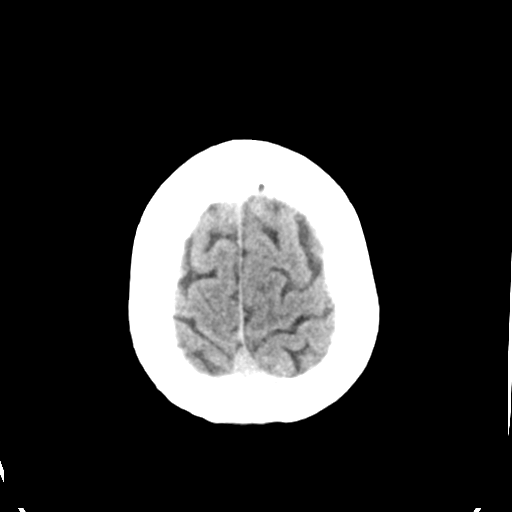

[Series 4: head bone · axial · 0.43mm/px · z∈[-99,-65]mm · 3 of 85 slices shown]
[im 9/85  bone]
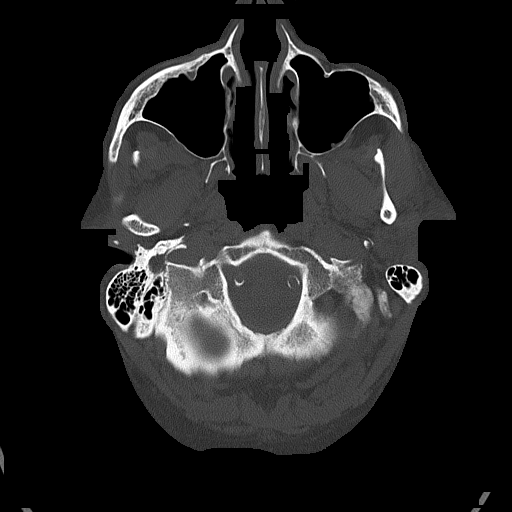
[im 17/85  bone]
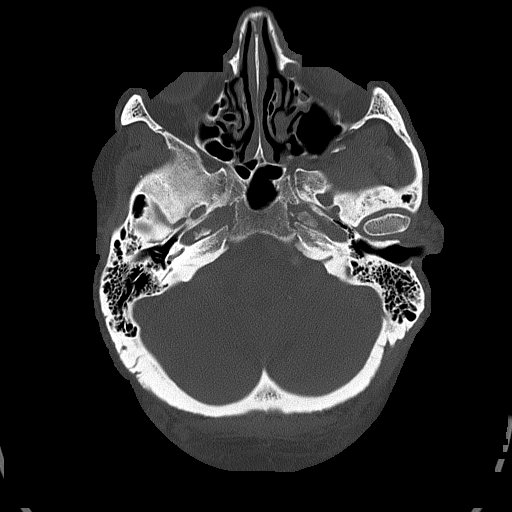
[im 26/85  bone]
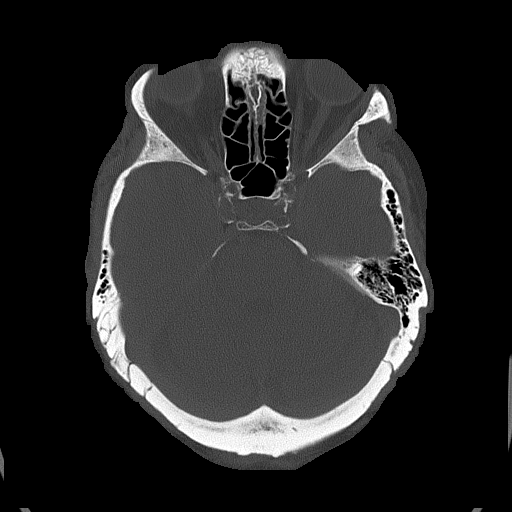

[Series 5: head without cor · coronal · non-contrast · 0.35mm/px · 3 of 68 slices shown]
[im 23/68  brain]
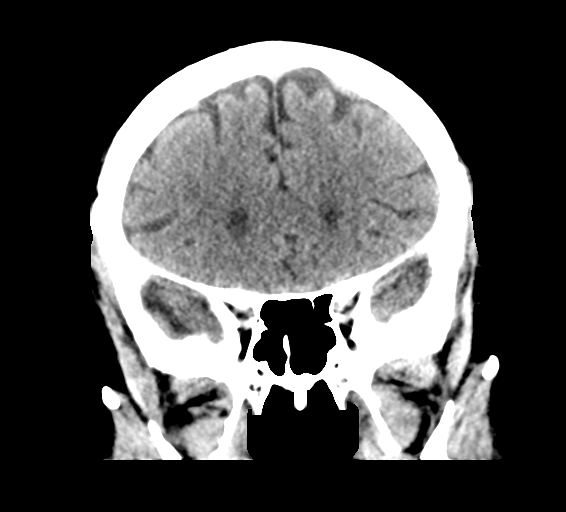
[im 30/68  brain]
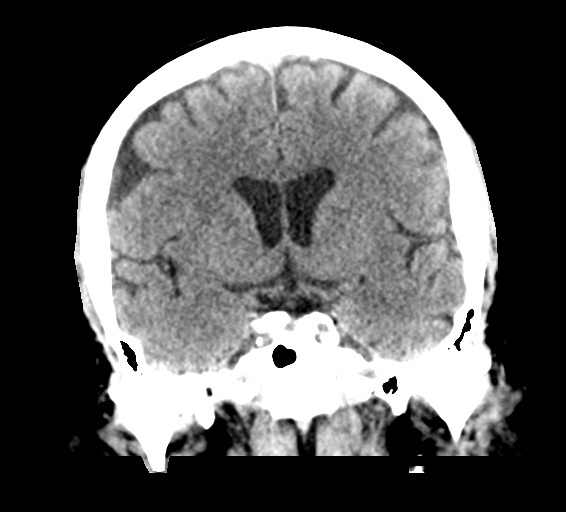
[im 38/68  brain]
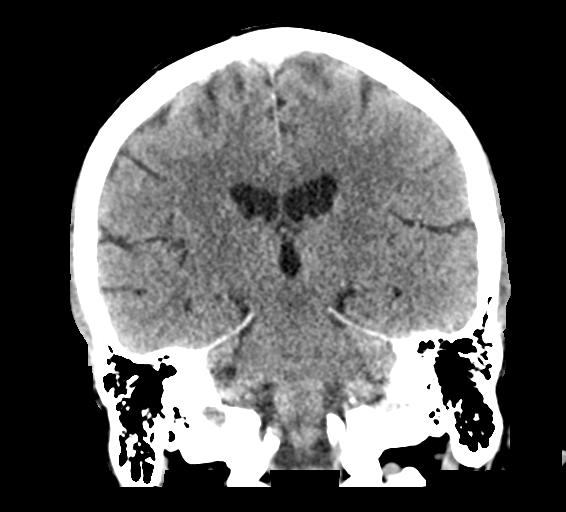

[Series 6: head without sag · sagittal · non-contrast · 0.33mm/px · 3 of 67 slices shown]
[im 23/67  brain]
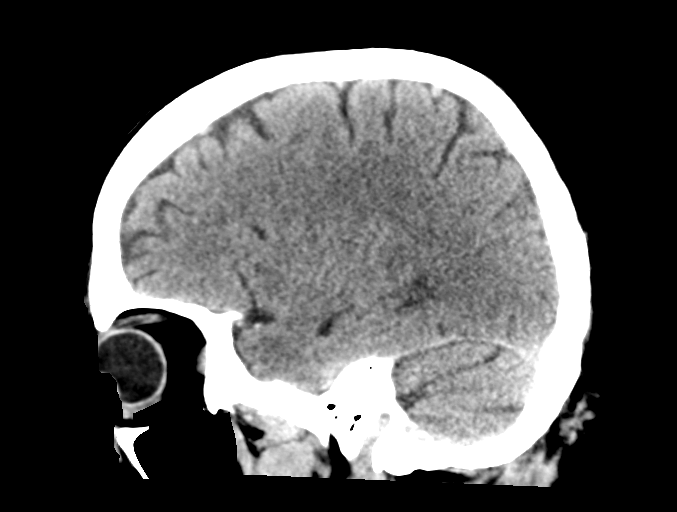
[im 34/67  brain]
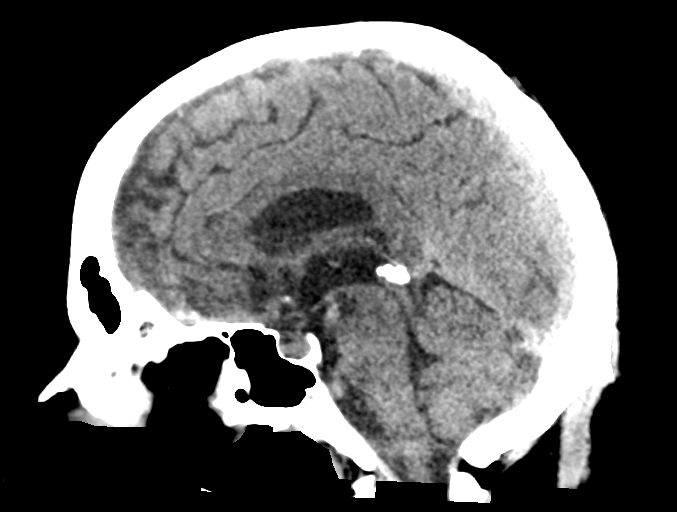
[im 45/67  brain]
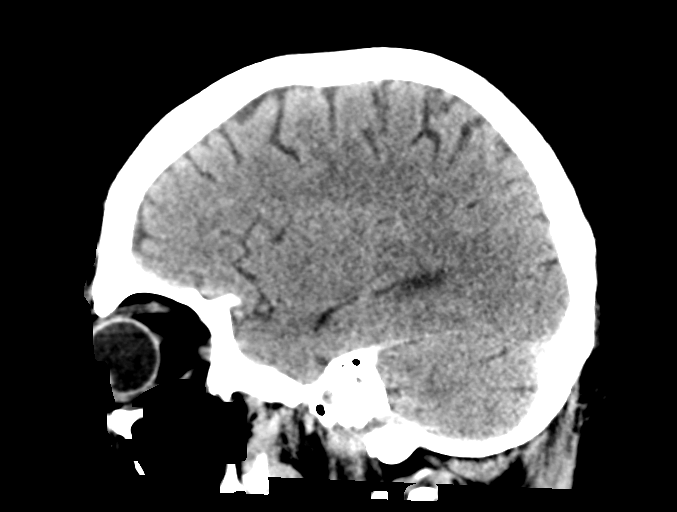

[16 of 47 positions shown; findings below may reference images not displayed]

FINDINGS: Brain: No evidence of acute infarction, hemorrhage, hydrocephalus,
extra-axial collection or mass lesion/mass effect.

Vascular: No hyperdense vessel or unexpected calcification.

Skull: Normal. Negative for fracture or focal lesion.

Sinuses/Orbits: No acute finding.

Other: None.
IMPRESSION: No acute intracranial pathology. No non-contrast CT findings to
explain headache or dizziness.

## 2020-08-31 IMAGING — CT CT ANGIOGRAPHY HEAD
2 of 7 series · 8 of 33 positions shown · IV contrast (omnipaque)
Comparison: CT head today

CLINICAL DATA: TIA

EXAM:
CT ANGIOGRAPHY HEAD AND NECK
TECHNIQUE: Multidetector CT imaging of the head and neck was performed using
the standard protocol during bolus administration of intravenous
contrast. Multiplanar CT image reconstructions and MIPs were
obtained to evaluate the vascular anatomy. Carotid stenosis
measurements (when applicable) are obtained utilizing NASCET
criteria, using the distal internal carotid diameter as the
denominator.
CONTRAST:  75mL OMNIPAQUE IOHEXOL 350 MG/ML SOLN

[Series 6: cta neck/head · axial · 0.58mm/px · z∈[+1074,+1178]mm · 2 of 157 slices shown]
[im 53/157  soft-tissue]
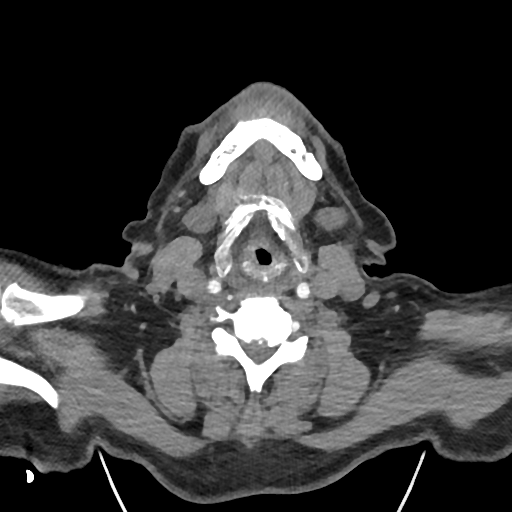
[im 105/157  soft-tissue]
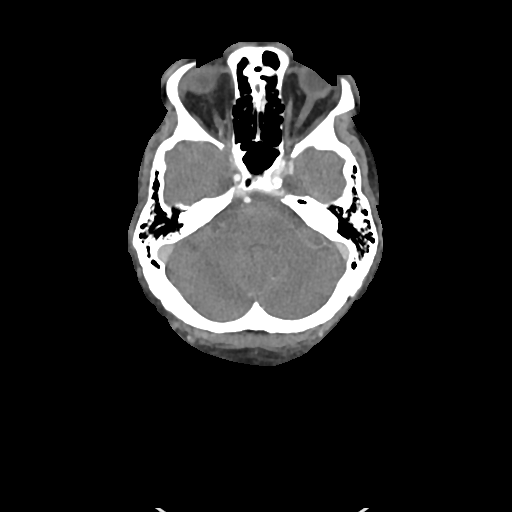

[Series 8: ax thins · axial · 0.42mm/px · z∈[+1014,+1237]mm · 6 of 313 slices shown]
[im 45/313  soft-tissue]
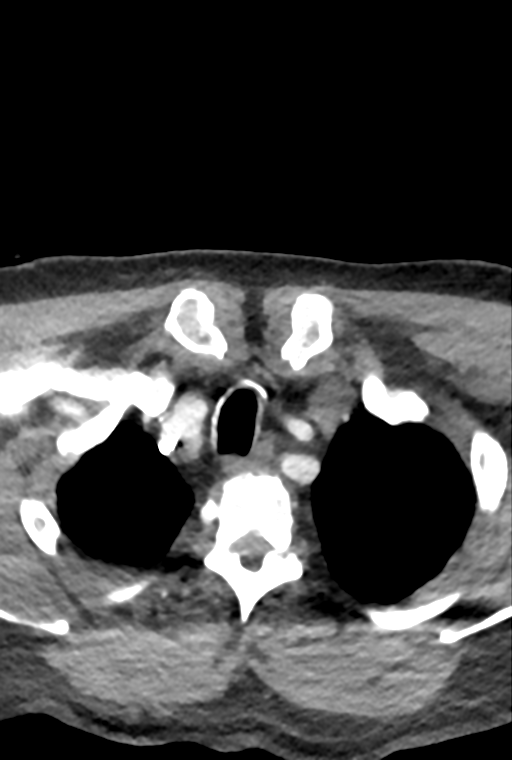
[im 90/313  bone]
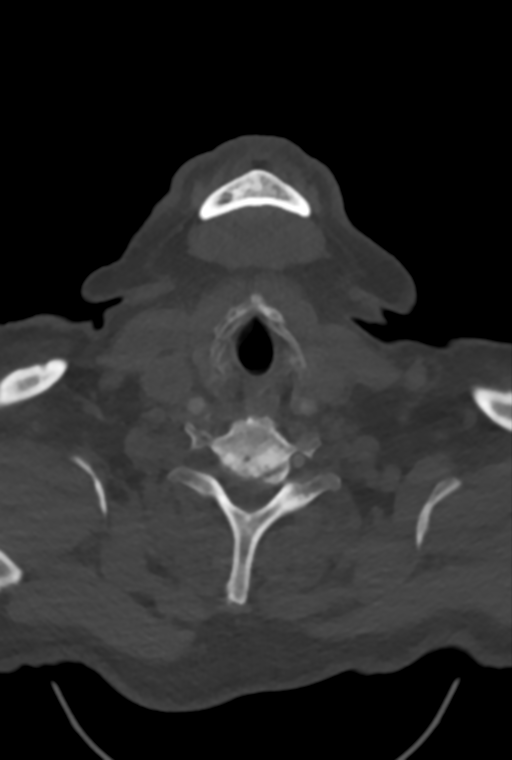
[im 134/313  soft-tissue]
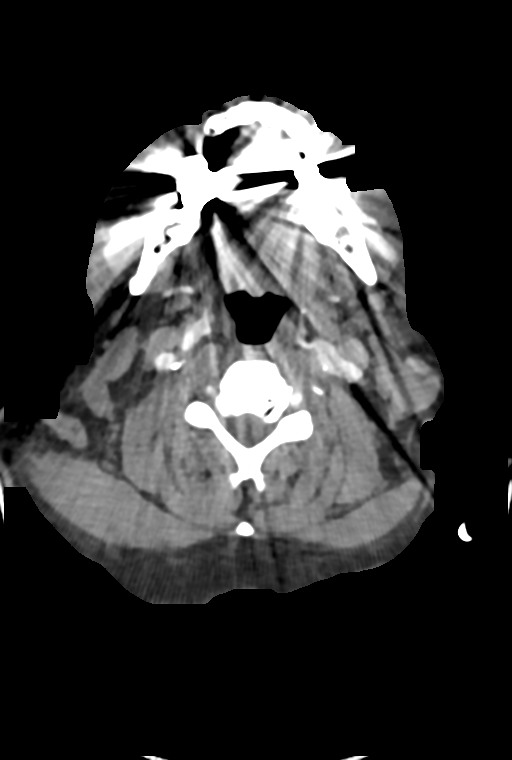
[im 179/313  bone]
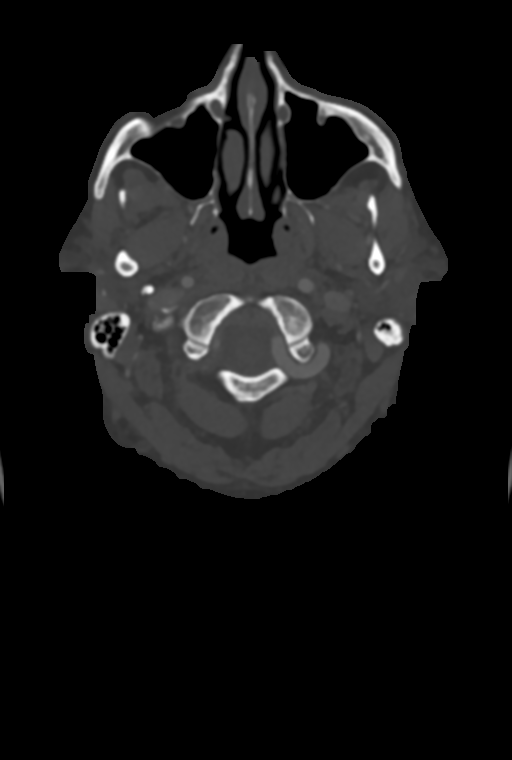
[im 223/313  soft-tissue]
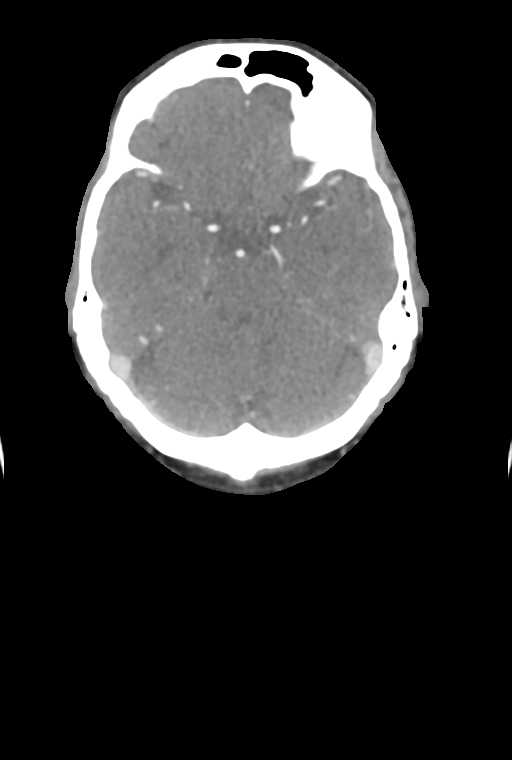
[im 268/313  bone]
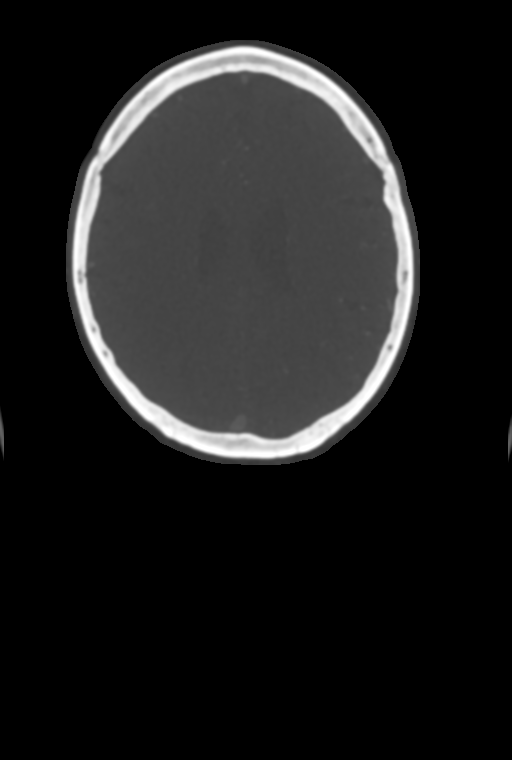

[8 of 33 positions shown; findings below may reference images not displayed]

FINDINGS: CTA NECK FINDINGS

Aortic arch: Standard branching. Imaged portion shows no evidence of
aneurysm or dissection. No significant stenosis of the major arch
vessel origins. Mild atherosclerotic disease in the aortic arch.

Right carotid system: Mild atherosclerotic disease right carotid
bifurcation without significant stenosis or irregularity

Left carotid system: Mild atherosclerotic disease left bifurcation
without significant stenosis or irregularity.

Vertebral arteries: Left vertebral artery dominant. Mild to moderate
stenosis left vertebral artery at the skull base. Moderate stenosis
distal right vertebral artery at the skull base.

Skeleton: Cervical spondylosis without acute skeletal abnormality.

Other neck: Negative for mass or adenopathy.

Upper chest: Negative

Review of the MIP images confirms the above findings

CTA HEAD FINDINGS

Anterior circulation: Atherosclerotic calcification in the cavernous
carotid bilaterally with mild stenosis bilaterally. Anterior and
middle cerebral arteries patent bilaterally without stenosis or
branch occlusion.

Posterior circulation: Moderate stenosis distal vertebral artery
bilaterally at the skull base. Right vertebral artery ends in PICA.
Left vertebral artery supplies the basilar. PICA patent bilaterally.
Basilar widely patent. Superior cerebellar and posterior cerebral
arteries patent bilaterally without significant stenosis.

Venous sinuses: Normal venous enhancement

Anatomic variants: None

Review of the MIP images confirms the above findings
IMPRESSION: Mild atherosclerotic disease in the carotid bifurcation without
significant stenosis. Mild stenosis in the cavernous carotid
bilaterally.

Moderate stenosis distal vertebral artery bilaterally. Right
vertebral artery ends in PICA.

Negative for intracranial large vessel occlusion.

## 2020-09-03 ENCOUNTER — Encounter (HOSPITAL_COMMUNITY): Payer: No Typology Code available for payment source | Admitting: Internal Medicine

## 2020-09-03 ENCOUNTER — Encounter (HOSPITAL_COMMUNITY): Payer: No Typology Code available for payment source

## 2020-09-04 ENCOUNTER — Other Ambulatory Visit (HOSPITAL_COMMUNITY): Payer: Self-pay | Admitting: Internal Medicine

## 2020-09-05 ENCOUNTER — Encounter (HOSPITAL_COMMUNITY)
Admission: RE | Admit: 2020-09-05 | Discharge: 2020-09-05 | Disposition: A | Discharge status: Home / Self Care | Payer: No Typology Code available for payment source | Source: Ambulatory Visit | Attending: Internal Medicine | Admitting: Internal Medicine

## 2020-09-05 ENCOUNTER — Other Ambulatory Visit: Payer: Self-pay

## 2020-09-05 DIAGNOSIS — I214 Non-ST elevation (NSTEMI) myocardial infarction: Secondary | ICD-10-CM

## 2020-09-05 DIAGNOSIS — Z951 Presence of aortocoronary bypass graft: Secondary | ICD-10-CM | POA: Diagnosis not present

## 2020-09-07 ENCOUNTER — Other Ambulatory Visit: Payer: Self-pay

## 2020-09-07 ENCOUNTER — Encounter (HOSPITAL_COMMUNITY)
Admission: RE | Admit: 2020-09-07 | Discharge: 2020-09-07 | Disposition: A | Discharge status: Home / Self Care | Payer: No Typology Code available for payment source | Source: Ambulatory Visit | Attending: Internal Medicine | Admitting: Internal Medicine

## 2020-09-07 VITALS — Wt 227.0 lb

## 2020-09-07 DIAGNOSIS — Z951 Presence of aortocoronary bypass graft: Secondary | ICD-10-CM

## 2020-09-07 DIAGNOSIS — I214 Non-ST elevation (NSTEMI) myocardial infarction: Secondary | ICD-10-CM

## 2020-09-07 NOTE — Progress Notes (Signed)
[name] 75 y.o. male Nutrition Note  Visit Diagnosis: NSTEMI (non-ST elevated myocardial infarction) ([facility]) 05/16/20  S/P CABG x 4, [facility] Procedure 05/18/2020  Past Medical History:  Diagnosis Date   Aortic stenosis, mild    noted on ECHO   Asthma    Benign essential hypertension, on Norvasc and Amlodipine 12/21/2008   Followed by [facility]    BPH (benign prostatic hyperplasia) 09/03/2012   Carotid bruit present 06/15/2018   2015 Carotid Dopplers:  Essentially normal carotid arteries, with very slight hard plaque in the proximal ICA's and serpentine distal vessels. 1-39% bilateral ICA stenosis. Patent vertebral arteries with antegrade flow. Normal subclavian arteries, bilaterally.   Colonic polyp 01/09/2020   COPD (chronic obstructive pulmonary disease) (HCC)    Coronary artery disease    Dyslipidemia 12/21/2008   Qualifier: Diagnosis of  By: Percival Spanish, MD, Farrel Gordon     Essential hypertension, benign 12/21/2008   Qualifier: Diagnosis of  By: Percival Spanish, MD, Farrel Gordon     Former smoker, stopped smoking in distant past 03/15/2018   Hematuria 09/16/2011   History of migraine    Hyperlipidemia    OA (osteoarthritis)    Obese    OSA on CPAP 09/25/2015   Diagn[facility] at [facility]    Prostate cancer [facility]) 11/30/2019   PVC's (premature ventricular contractions)    Seasonal allergies    Sinus bradycardia    Squamous cell carcinoma of skin    TIA (transient ischemic attack)    questionable   Vitamin B 12 deficiency    Vitamin D deficiency 11/26/2010     Medications reviewed.   Current Outpatient Medications:    alfuzosin (UROXATRAL) 10 MG 24 hr tablet, Take 10 mg by mouth daily with breakfast., Disp: , Rfl:    amiodarone (PACERONE) 200 MG tablet, TAKE 1 TABLET BY MOUTH EVERY DAY, Disp: 30 tablet, Rfl: 1   apixaban (ELIQUIS) 5 MG TABS tablet, Take 1 tablet (5 mg total) by mouth 2 (two) times daily., Disp: 180 tablet, Rfl: 3    aspirin 81 MG EC tablet, Take 1 tablet (81 mg total) by mouth daily. Swallow whole., Disp: 90 tablet, Rfl: 3   carboxymethylcellulose (REFRESH PLUS) 0.5 % SOLN, Place 1 drop into both eyes 2 (two) times daily as needed (dry eyes)., Disp: , Rfl:    cetirizine (ZYRTEC) 10 MG tablet, Take 10 mg by mouth daily., Disp: , Rfl:    Cholecalciferol (VITAMIN D3) 2000 UNITS TABS, Take 2,000 Int'l Units by mouth daily. , Disp: , Rfl:    empagliflozin (JARDIANCE) 10 MG TABS tablet, Take 1 tablet (10 mg total) by mouth daily., Disp: 90 tablet, Rfl: 3   finasteride (PROSCAR) 5 MG tablet, Take 5 mg by mouth daily., Disp: , Rfl:    fluticasone (FLONASE) 50 MCG/ACT nasal spray, USE TWO SPRAYS IN EACH NOSTRIL DAILY AS NEEDED. (Patient taking differently: Place 2 sprays into both nostrils daily as needed for allergies.), Disp: 48 mL, Rfl: 0   furosemide (LASIX) 80 MG tablet, Take 0.5 tablets (40 mg total) by mouth daily as needed. (Patient taking differently: Take 40 mg by mouth daily as needed for fluid or edema.), Disp: 45 tablet, Rfl: 3   latanoprost (XALATAN) 0.005 % ophthalmic solution, INSTILL 1 DROP INTO BOTH EYES AT BEDTIME, Disp: 11.25 mL, Rfl: 1   montelukast (SINGULAIR) 10 MG tablet, TAKE 1 TABLET BY MOUTH EVERY DAY AT BEDTIME, Disp: 90 tablet, Rfl: 3   NON FORMULARY, Cpap at night,  Disp: , Rfl:    rosuvastatin (CRESTOR) 20 MG tablet, Take 1 tablet (20 mg total) by mouth daily., Disp: 90 tablet, Rfl: 3   sacubitril-valsartan (ENTRESTO) 24-26 MG, Take 1 tablet by mouth 2 (two) times daily., Disp: 180 tablet, Rfl: 3   spironolactone (ALDACTONE) 25 MG tablet, TAKE 1 TABLET BY MOUTH EVERY DAY, Disp: 90 tablet, Rfl: 1   Ht Readings from Last 1 Encounters:  08/23/20 5' 7.25" (1.708 m)     Wt Readings from Last 3 Encounters:  08/23/20 227 lb 1.2 oz (103 kg)  07/31/20 231 lb 6.4 oz (105 kg)  07/03/20 228 lb (103.4 kg)     There is no height or weight on file to calculate BMI.   Social History    Tobacco Use  Smoking Status Former Smoker   Types: Cigarettes   Quit date: 08/18/1965   Years since quitting: 55.0  Smokeless Tobacco Never Used  Tobacco Comment   pt quit smoking 53 yrs ago     Lab Results  Component Value Date   CHOL 123 05/17/2020   Lab Results  Component Value Date   HDL 32 (L) 05/17/2020   Lab Results  Component Value Date   LDLCALC NOT CALCULATED 05/17/2020   Lab Results  Component Value Date   TRIG 71 05/17/2020     Lab Results  Component Value Date   HGBA1C 5.3 05/18/2020     CBG (last 3)  No results for input(s): GLUCAP in the last 72 hours.   Nutrition Note  Spoke with pt. Nutrition Plan and Nutrition Survey goals reviewed with pt. Pt is following a Heart Healthy diet. Pt wants to lose wt. He has already lost 25 lbs (he reports some fluid and some weight loss d/t poor appetite for 3 weeks after surgery. He states wanting to lose another 5-10 lbs. He has reduced portion sizes, cut back on red meat/ice cream/alcohol.  Pt with dx of CHF. Per discussion, pt does not use canned/convenience foods often. Pt does add salt to food. Pt does not eat out frequently. He cooks at home most often. He has 3 gardens and he cans foods to eat year round. He eats more veggies in the summer. He chooses whole grains most of the time.  He drinks only water - reports drinking 2-4 liters per day. Denies weight gain>2 lbs overnight. He has been weighing daily to assess fluid status. He reports no lower leg edema.  He reads labels and tries to avoid foods with >180 mg sodium/serving. He has supportive wife and daughter who have helped him make diet changes.  Pt expressed understanding of the information reviewed.    Nutrition Diagnosis  ? Obese  II = 35-39.9 related to excessive energy intake as evidenced by a BMI 35.29 kg/m2  Nutrition Intervention ? Pts individual nutrition plan reviewed with pt. ? Reduced sodium diet/2 g sodium per day ? Continue  client-centered nutrition education by RD, as part of interdisciplinary care.  Goal(s)  ? Pt to identify food quantities necessary to achieve weight loss of 6-24 lb at graduation from cardiac rehab.  ? Pt to build a healthy plate including vegetables, fruits, whole grains, and low-fat dairy products in a heart healthy meal plan. ? Pt to continue reading labels and avoiding salt shaker to reduce sodium intake  Plan:   Will provide client-centered nutrition education as part of interdisciplinary care  Monitor and evaluate progress toward nutrition goal with team.   Michaele Offer, MS, RDN,  LDN

## 2020-09-10 ENCOUNTER — Encounter (HOSPITAL_COMMUNITY)
Admission: RE | Admit: 2020-09-10 | Discharge: 2020-09-10 | Disposition: A | Discharge status: Home / Self Care | Payer: No Typology Code available for payment source | Source: Ambulatory Visit | Attending: Internal Medicine | Admitting: Internal Medicine

## 2020-09-10 ENCOUNTER — Other Ambulatory Visit: Payer: Self-pay

## 2020-09-10 DIAGNOSIS — Z951 Presence of aortocoronary bypass graft: Secondary | ICD-10-CM

## 2020-09-10 DIAGNOSIS — I214 Non-ST elevation (NSTEMI) myocardial infarction: Secondary | ICD-10-CM

## 2020-09-12 ENCOUNTER — Encounter (HOSPITAL_COMMUNITY)
Admission: RE | Admit: 2020-09-12 | Discharge: 2020-09-12 | Disposition: A | Discharge status: Home / Self Care | Payer: No Typology Code available for payment source | Source: Ambulatory Visit | Attending: Internal Medicine | Admitting: Internal Medicine

## 2020-09-12 ENCOUNTER — Other Ambulatory Visit: Payer: Self-pay

## 2020-09-12 DIAGNOSIS — I214 Non-ST elevation (NSTEMI) myocardial infarction: Secondary | ICD-10-CM

## 2020-09-12 DIAGNOSIS — Z951 Presence of aortocoronary bypass graft: Secondary | ICD-10-CM | POA: Diagnosis not present

## 2020-09-14 ENCOUNTER — Other Ambulatory Visit: Payer: Self-pay

## 2020-09-14 ENCOUNTER — Encounter (HOSPITAL_COMMUNITY)
Admission: RE | Admit: 2020-09-14 | Discharge: 2020-09-14 | Disposition: A | Discharge status: Home / Self Care | Payer: No Typology Code available for payment source | Source: Ambulatory Visit | Attending: Internal Medicine | Admitting: Internal Medicine

## 2020-09-14 DIAGNOSIS — Z951 Presence of aortocoronary bypass graft: Secondary | ICD-10-CM | POA: Diagnosis not present

## 2020-09-14 DIAGNOSIS — I214 Non-ST elevation (NSTEMI) myocardial infarction: Secondary | ICD-10-CM

## 2020-09-17 ENCOUNTER — Encounter (HOSPITAL_COMMUNITY)
Admission: RE | Admit: 2020-09-17 | Discharge: 2020-09-17 | Disposition: A | Discharge status: Home / Self Care | Payer: No Typology Code available for payment source | Source: Ambulatory Visit | Attending: Internal Medicine | Admitting: Internal Medicine

## 2020-09-17 ENCOUNTER — Other Ambulatory Visit: Payer: Self-pay

## 2020-09-17 DIAGNOSIS — Z951 Presence of aortocoronary bypass graft: Secondary | ICD-10-CM

## 2020-09-17 DIAGNOSIS — I214 Non-ST elevation (NSTEMI) myocardial infarction: Secondary | ICD-10-CM

## 2020-09-18 NOTE — Progress Notes (Signed)
Reviewed home exercise Rx with patient today. Pt has a treadmill at home and he will begin walking 2-3 x/week for 30 minutes. Also discussed breaking times into 15 minute increments 2 x/day. Encouraged warm-up, cool-down and stretching with exercise. Reviewed THRR of 58 - 116 and keeping RPE between 11 -13 on RPE scale. Fluids encouraged before, during and after activity. Reviewed weather parameters for temperature and humidity for safe exercise outdoors. Discussed S/S that would require patient to terminate exercise and when to call 911 vs MD. Pt verbalized understanding of the home exercise Rx and was provided copy.   Lesly Rubenstein MS, ACSM-EP-C, CCRP

## 2020-09-19 ENCOUNTER — Other Ambulatory Visit: Payer: Self-pay

## 2020-09-19 ENCOUNTER — Encounter (HOSPITAL_COMMUNITY)
Admission: RE | Admit: 2020-09-19 | Discharge: 2020-09-19 | Disposition: A | Discharge status: Home / Self Care | Payer: No Typology Code available for payment source | Source: Ambulatory Visit | Attending: Internal Medicine | Admitting: Internal Medicine

## 2020-09-19 DIAGNOSIS — Z951 Presence of aortocoronary bypass graft: Secondary | ICD-10-CM

## 2020-09-19 DIAGNOSIS — I25810 Atherosclerosis of coronary artery bypass graft(s) without angina pectoris: Secondary | ICD-10-CM | POA: Insufficient documentation

## 2020-09-19 DIAGNOSIS — I214 Non-ST elevation (NSTEMI) myocardial infarction: Secondary | ICD-10-CM

## 2020-09-21 ENCOUNTER — Encounter (HOSPITAL_COMMUNITY)
Admission: RE | Admit: 2020-09-21 | Discharge: 2020-09-21 | Disposition: A | Discharge status: Home / Self Care | Payer: No Typology Code available for payment source | Source: Ambulatory Visit | Attending: Internal Medicine | Admitting: Internal Medicine

## 2020-09-21 ENCOUNTER — Other Ambulatory Visit: Payer: Self-pay

## 2020-09-21 DIAGNOSIS — I25810 Atherosclerosis of coronary artery bypass graft(s) without angina pectoris: Secondary | ICD-10-CM | POA: Diagnosis not present

## 2020-09-21 DIAGNOSIS — I214 Non-ST elevation (NSTEMI) myocardial infarction: Secondary | ICD-10-CM

## 2020-09-21 DIAGNOSIS — Z951 Presence of aortocoronary bypass graft: Secondary | ICD-10-CM

## 2020-09-24 ENCOUNTER — Encounter (HOSPITAL_COMMUNITY)
Admission: RE | Admit: 2020-09-24 | Discharge: 2020-09-24 | Disposition: A | Discharge status: Home / Self Care | Payer: No Typology Code available for payment source | Source: Ambulatory Visit | Attending: Internal Medicine | Admitting: Internal Medicine

## 2020-09-24 ENCOUNTER — Other Ambulatory Visit: Payer: Self-pay

## 2020-09-24 DIAGNOSIS — I25810 Atherosclerosis of coronary artery bypass graft(s) without angina pectoris: Secondary | ICD-10-CM | POA: Diagnosis not present

## 2020-09-24 DIAGNOSIS — I214 Non-ST elevation (NSTEMI) myocardial infarction: Secondary | ICD-10-CM

## 2020-09-24 DIAGNOSIS — Z951 Presence of aortocoronary bypass graft: Secondary | ICD-10-CM

## 2020-09-25 NOTE — Progress Notes (Signed)
Cardiac Individual Treatment Plan  Patient Details  Name: Eric Stokes MRN: [id number] Date of Birth: 29-Dec-1945 Referring Provider:   Flowsheet Row CARDIAC REHAB PHASE II ORIENTATION from 08/23/2020 in [place]  Referring Provider Dr Kristopher Oppenheim PA [facility]/ Dr Fransico Him MD, Covering      Initial Encounter Date:  [place] from 08/23/2020 in [place]  Date 08/23/20      Visit Diagnosis: NSTEMI (non-ST elevated myocardial infarction) ([place]) 05/16/20  S/P CABG x 4, [facility] Procedure 05/18/2020  Patient's Home Medications on Admission:  Current Outpatient Medications:  .  alfuzosin (UROXATRAL) 10 MG 24 hr tablet, Take 10 mg by mouth daily with breakfast., Disp: , Rfl:  .  amiodarone (PACERONE) 200 MG tablet, TAKE 1 TABLET BY MOUTH EVERY DAY, Disp: 30 tablet, Rfl: 1 .  apixaban (ELIQUIS) 5 MG TABS tablet, Take 1 tablet (5 mg total) by mouth 2 (two) times daily., Disp: 180 tablet, Rfl: 3 .  aspirin 81 MG EC tablet, Take 1 tablet (81 mg total) by mouth daily. Swallow whole., Disp: 90 tablet, Rfl: 3 .  carboxymethylcellulose (REFRESH PLUS) 0.5 % SOLN, Place 1 drop into both eyes 2 (two) times daily as needed (dry eyes)., Disp: , Rfl:  .  cetirizine (ZYRTEC) 10 MG tablet, Take 10 mg by mouth daily., Disp: , Rfl:  .  Cholecalciferol (VITAMIN D3) 2000 UNITS TABS, Take 2,000 Int'l Units by mouth daily. , Disp: , Rfl:  .  empagliflozin (JARDIANCE) 10 MG TABS tablet, Take 1 tablet (10 mg total) by mouth daily., Disp: 90 tablet, Rfl: 3 .  finasteride (PROSCAR) 5 MG tablet, Take 5 mg by mouth daily., Disp: , Rfl:  .  fluticasone (FLONASE) 50 MCG/ACT nasal spray, USE TWO SPRAYS IN EACH NOSTRIL DAILY AS NEEDED. (Patient taking differently: Place 2 sprays into both nostrils daily as needed for allergies.), Disp: 48 mL, Rfl: 0 .  furosemide (LASIX) 80 MG tablet, Take 0.5 tablets (40 mg  total) by mouth daily as needed. (Patient taking differently: Take 40 mg by mouth daily as needed for fluid or edema.), Disp: 45 tablet, Rfl: 3 .  latanoprost (XALATAN) 0.005 % ophthalmic solution, INSTILL 1 DROP INTO BOTH EYES AT BEDTIME, Disp: 11.25 mL, Rfl: 1 .  montelukast (SINGULAIR) 10 MG tablet, TAKE 1 TABLET BY MOUTH EVERY DAY AT BEDTIME, Disp: 90 tablet, Rfl: 3 .  NON FORMULARY, Cpap at night, Disp: , Rfl:  .  rosuvastatin (CRESTOR) 20 MG tablet, Take 1 tablet (20 mg total) by mouth daily., Disp: 90 tablet, Rfl: 3 .  sacubitril-valsartan (ENTRESTO) 24-26 MG, Take 1 tablet by mouth 2 (two) times daily., Disp: 180 tablet, Rfl: 3 .  spironolactone (ALDACTONE) 25 MG tablet, TAKE 1 TABLET BY MOUTH EVERY DAY, Disp: 90 tablet, Rfl: 1  Past Medical History: Past Medical History:  Diagnosis Date  . Aortic stenosis, mild    noted on ECHO  . Asthma   . Benign essential hypertension, on Norvasc and Amlodipine 12/21/2008   Followed by [facility]   . BPH (benign prostatic hyperplasia) 09/03/2012  . Carotid bruit present 06/15/2018   2015 Carotid Dopplers:  Essentially normal carotid arteries, with very slight hard plaque in the proximal ICA's and serpentine distal vessels. 1-39% bilateral ICA stenosis. Patent vertebral arteries with antegrade flow. Normal subclavian arteries, bilaterally.  . Colonic polyp 01/09/2020  . COPD (chronic obstructive pulmonary disease) ([place])   . Coronary  artery disease   . Dyslipidemia 12/21/2008   Qualifier: Diagnosis of  By: Percival Spanish, MD, Farrel Gordon    . Essential hypertension, benign 12/21/2008   Qualifier: Diagnosis of  By: Percival Spanish, MD, Farrel Gordon    . Former smoker, stopped smoking in distant past 03/15/2018  . Hematuria 09/16/2011  . History of migraine   . Hyperlipidemia   . OA (osteoarthritis)   . Obese   . OSA on CPAP 09/25/2015   Diagnosed at the [facility] 2016   . Prostate cancer ([place]) 11/30/2019  . PVC's (premature ventricular contractions)    . Seasonal allergies   . Sinus bradycardia   . Squamous cell carcinoma of skin   . TIA (transient ischemic attack)    questionable  . Vitamin B 12 deficiency   . Vitamin D deficiency 11/26/2010    Tobacco Use: Social History   Tobacco Use  Smoking Status Former Smoker  . Types: Cigarettes  . Quit date: 08/18/1965  . Years since quitting: 55.1  Smokeless Tobacco Never Used  Tobacco Comment   pt quit smoking 53 yrs ago    Labs: Recent Review Flowsheet Data    Labs for ITP Cardiac and Pulmonary Rehab Latest Ref Rng & Units 05/26/2020 05/27/2020 05/28/2020 05/29/2020 05/30/2020   Cholestrol 0 - 200 mg/dL - - - - -   LDLCALC 0 - 99 mg/dL - - - - -   LDLDIRECT <100 mg/dL - - - - -   HDL >40 mg/dL - - - - -   Trlycerides <150 mg/dL - - - - -   Hemoglobin A1c 4.8 - 5.6 % - - - - -   PHART 7.350 - 7.450 - - - - -   PCO2ART 32.0 - 48.0 mmHg - - - - -   HCO3 20.0 - 28.0 mmol/L - - - - -   TCO2 22 - 32 mmol/L - - - - -   ACIDBASEDEF 0.0 - 2.0 mmol/L - - - - -   O2SAT % 59.6 71.1 62.8 73.8 72.4      Capillary Blood Glucose: Lab Results  Component Value Date   GLUCAP 131 (H) 05/30/2020   GLUCAP 122 (H) 05/30/2020   GLUCAP 106 (H) 05/30/2020   GLUCAP 93 05/30/2020   GLUCAP 121 (H) 05/29/2020     Exercise Target Goals: Exercise Program Goal: Individual exercise prescription set using results from initial 6 min walk test and THRR while considering  patient's activity barriers and safety.   Exercise Prescription Goal: Starting with aerobic activity 30 plus minutes a day, 3 days per week for initial exercise prescription. Provide home exercise prescription and guidelines that participant acknowledges understanding prior to discharge.  Activity Barriers & Risk Stratification:  Activity Barriers & Cardiac Risk Stratification - 08/23/20 1543      Activity Barriers & Cardiac Risk Stratification   Activity Barriers Back Problems;Right Knee Replacement;Joint  Problems;Deconditioning;Muscular Weakness;Shortness of Breath;Balance Concerns;Assistive Device    Cardiac Risk Stratification High           6 Minute Walk:  6 Minute Walk    Row Name 08/23/20 1449         6 Minute Walk   Distance 812 feet     Walk Time 6 minutes     # of Rest Breaks 0     MPH 1.54     METS 1.12     RPE 11     Perceived Dyspnea  1     VO2  Peak 3.91     Symptoms Yes (comment)     Comments Knee pain 3/10, SOB RPD 1     Resting HR 76 bpm     Resting BP 118/56     Resting Oxygen Saturation  98 %     Exercise Oxygen Saturation  during 6 min walk 97 %     Max Ex. HR 83 bpm     Max Ex. BP 124/70     2 Minute Post BP 120/68            Oxygen Initial Assessment:   Oxygen Re-Evaluation:   Oxygen Discharge (Final Oxygen Re-Evaluation):   Initial Exercise Prescription:  Initial Exercise Prescription - 08/23/20 1500      Date of Initial Exercise RX and Referring Provider   Date 08/23/20    Referring Provider Dr Kristopher Oppenheim PA [facility]/ Dr Fransico Him MD, Covering    Expected Discharge Date 10/19/20      NuStep   Level 1    SPM 85    Minutes 30    METs 1.6      Prescription Details   Frequency (times per week) 3    Duration Progress to 30 minutes of continuous aerobic without signs/symptoms of physical distress      Intensity   THRR 40-80% of Max Heartrate 58-116    Ratings of Perceived Exertion 11-13    Perceived Dyspnea 0-4      Progression   Progression Continue progressive overload as per policy without signs/symptoms or physical distress.      Resistance Training   Training Prescription Yes    Weight 3    Reps 10-15           Perform Capillary Blood Glucose checks as needed.  Exercise Prescription Changes:  Exercise Prescription Changes    Row Name 08/27/20 1400 09/12/20 1500 09/24/20 1445         Response to Exercise   Blood Pressure (Admit) 122/58 137/77 124/70     Blood Pressure (Exercise) 116/58 130/68  118/70     Blood Pressure (Exit) 118/68 126/60 114/70     Heart Rate (Admit) 84 bpm 76 bpm 76 bpm     Heart Rate (Exercise) 82 bpm 76 bpm 79 bpm     Heart Rate (Exit) 74 bpm 68 bpm 60 bpm     Rating of Perceived Exertion (Exercise) 11 12 11      Symptoms None None None     Comments Pt's first day of exercise in the CRP2 program Reviewed METs and Home exercise Rx Reviewed goals and METs     Duration Progress to 30 minutes of  aerobic without signs/symptoms of physical distress Continue with 30 min of aerobic exercise without signs/symptoms of physical distress. Continue with 30 min of aerobic exercise without signs/symptoms of physical distress.     Intensity THRR unchanged THRR unchanged THRR unchanged           Progression   Progression Continue to progress workloads to maintain intensity without signs/symptoms of physical distress. Continue to progress workloads to maintain intensity without signs/symptoms of physical distress. Continue to progress workloads to maintain intensity without signs/symptoms of physical distress.     Average METs 1.3 1.6 1.9           Resistance Training   Training Prescription Yes No Yes     Weight 3 No weights on wednesdays 3 lbs     Reps 10-15 -- 10-15  Time 10 Minutes -- 10 Minutes           Interval Training   Interval Training No No No           NuStep   Level 1 2 3      SPM 70 75 75     Minutes 25 30 30      METs 1.3 1.6 1.9           Home Exercise Plan   Plans to continue exercise at -- Home (comment) Home (comment)     Frequency -- Add 2 additional days to program exercise sessions. Add 2 additional days to program exercise sessions.     Initial Home Exercises Provided -- 09/12/20 09/12/20            Exercise Comments:  Exercise Comments    Row Name 08/27/20 1452 09/12/20 1452 09/24/20 1437       Exercise Comments Pt's first day of exercise in the CRP2 program. Pt did not keep SPM on the nustep above 70 because he was afraid he  would get short of breath. Will continue to work with patient to build stamina and confidence. Reviewed METs and home exercise Rx with patient. Pt has treadmill at home which he will walk on 2-3x/week for 30 minutes. Reviewed METs and goals with patient. Pt voices he is progressing to his goal of feeling stronger and having more stamina for the exercise and other activites.            Exercise Goals and Review:  Exercise Goals    Row Name 08/23/20 1548             Exercise Goals   Increase Physical Activity Yes       Intervention Provide advice, education, support and counseling about physical activity/exercise needs.;Develop an individualized exercise prescription for aerobic and resistive training based on initial evaluation findings, risk stratification, comorbidities and participant's personal goals.       Expected Outcomes Short Term: Attend rehab on a regular basis to increase amount of physical activity.;Long Term: Add in home exercise to make exercise part of routine and to increase amount of physical activity.;Long Term: Exercising regularly at least 3-5 days a week.       Increase Strength and Stamina Yes       Able to understand and use rate of perceived exertion (RPE) scale Yes       Intervention Provide education and explanation on how to use RPE scale       Expected Outcomes Short Term: Able to use RPE daily in rehab to express subjective intensity level;Long Term:  Able to use RPE to guide intensity level when exercising independently       Able to understand and use Dyspnea scale Yes       Intervention Provide education and explanation on how to use Dyspnea scale       Expected Outcomes Short Term: Able to use Dyspnea scale daily in rehab to express subjective sense of shortness of breath during exertion;Long Term: Able to use Dyspnea scale to guide intensity level when exercising independently       Knowledge and understanding of Target Heart Rate Range (THRR) Yes        Intervention Provide education and explanation of THRR including how the numbers were predicted and where they are located for reference       Expected Outcomes Short Term: Able to state/look up THRR;Short Term: Able to use daily as guideline for  intensity in rehab;Long Term: Able to use THRR to govern intensity when exercising independently       Understanding of Exercise Prescription Yes       Intervention Provide education, explanation, and written materials on patient's individual exercise prescription       Expected Outcomes Short Term: Able to explain program exercise prescription;Long Term: Able to explain home exercise prescription to exercise independently              Exercise Goals Re-Evaluation :  Exercise Goals Re-Evaluation    Row Name 08/27/20 1451 09/12/20 1450 09/24/20 1033 09/24/20 1435       Exercise Goal Re-Evaluation   Exercise Goals Review Increase Physical Activity;Increase Strength and Stamina;Able to understand and use rate of perceived exertion (RPE) scale;Knowledge and understanding of Target Heart Rate Range (THRR);Understanding of Exercise Prescription Increase Physical Activity;Increase Strength and Stamina;Able to understand and use rate of perceived exertion (RPE) scale;Knowledge and understanding of Target Heart Rate Range (THRR);Understanding of Exercise Prescription -- Increase Physical Activity;Increase Strength and Stamina;Able to understand and use rate of perceived exertion (RPE) scale;Knowledge and understanding of Target Heart Rate Range (THRR);Understanding of Exercise Prescription    Comments Pt's first day of execise in the CRP2 program, Pt understnads the RPE scale, THRR, and Exercise Rx. Reviewed METs and Home exercise Rx with patient. Pt will use his treadmill at home 2-3x/week for 30 minutes. Pt verbalized understanding of the home exercise Rx and was provided a copy. -- Reviewed Goals and METS. Pt voices that he feels he is making progress toward his  goals. He voices feeling stronger and has more stamina. Encouraging walking at home to supplement the CRP2 program to help increase CV fitness.    Expected Outcomes Will continue to monitor patient and progress exercise workloads as tolerated. Pt will exercise at home on his own on his off days from [facility] program -- Will continue to monitor patient and progress workloads as tolerated.            Discharge Exercise Prescription (Final Exercise Prescription Changes):  Exercise Prescription Changes - 09/24/20 1445      Response to Exercise   Blood Pressure (Admit) 124/70    Blood Pressure (Exercise) 118/70    Blood Pressure (Exit) 114/70    Heart Rate (Admit) 76 bpm    Heart Rate (Exercise) 79 bpm    Heart Rate (Exit) 60 bpm    Rating of Perceived Exertion (Exercise) 11    Symptoms None    Comments Reviewed goals and METs    Duration Continue with 30 min of aerobic exercise without signs/symptoms of physical distress.    Intensity THRR unchanged      Progression   Progression Continue to progress workloads to maintain intensity without signs/symptoms of physical distress.    Average METs 1.9      Resistance Training   Training Prescription Yes    Weight 3 lbs    Reps 10-15    Time 10 Minutes      Interval Training   Interval Training No      NuStep   Level 3    SPM 75    Minutes 30    METs 1.9      Home Exercise Plan   Plans to continue exercise at Home (comment)    Frequency Add 2 additional days to program exercise sessions.    Initial Home Exercises Provided 09/12/20           Nutrition:  Target  Goals: Understanding of nutrition guidelines, daily intake of sodium 1500mg , cholesterol 200mg , calories 30% from fat and 7% or less from saturated fats, daily to have 5 or more servings of fruits and vegetables.  Biometrics:  Pre Biometrics - 08/23/20 1550      Pre Biometrics   Height 5' 7.25" (1.708 m)    Weight 103 kg    Waist Circumference 47.5 inches     Hip Circumference 47 inches    Waist to Hip Ratio 1.01 %    BMI (Calculated) 35.31    Triceps Skinfold 14 mm    % Body Fat 34 %    Grip Strength 50 kg    Flexibility 15 in    Single Leg Stand --   Not done. Pt uses cane           Nutrition Therapy Plan and Nutrition Goals:  Nutrition Therapy & Goals - 09/07/20 1441      Nutrition Therapy   Diet TLC; low sodium    Drug/Food Interactions Statins/Certain Fruits      Personal Nutrition Goals   Nutrition Goal Pt to identify food quantities necessary to achieve weight loss of 6-24 lb at graduation from cardiac rehab.    Personal Goal #2 Pt to build a healthy plate including vegetables, fruits, whole grains, and low-fat dairy products in a heart healthy meal plan.    Personal Goal #3 Pt to continue reading labels and avoiding salt shaker to reduce sodium intake      Intervention Plan   Intervention Prescribe, educate and counsel regarding individualized specific dietary modifications aiming towards targeted core components such as weight, hypertension, lipid management, diabetes, heart failure and other comorbidities.    Expected Outcomes Short Term Goal: Understand basic principles of dietary content, such as calories, fat, sodium, cholesterol and nutrients.           Nutrition Assessments:  MEDIFICTS Score Key:  ?70 Need to make dietary changes   40-70 Heart Healthy Diet  ? 40 Therapeutic Level Cholesterol Diet   Picture Your Plate Scores:  <19 Unhealthy dietary pattern with much room for improvement.  41-50 Dietary pattern unlikely to meet recommendations for good health and room for improvement.  51-60 More healthful dietary pattern, with some room for improvement.   >60 Healthy dietary pattern, although there may be some specific behaviors that could be improved.    Nutrition Goals Re-Evaluation:  Nutrition Goals Re-Evaluation    [place] Name 09/07/20 1442 09/24/20 1043           Goals   Current Weight 227 lb  (103 kg) 229 lb 11.5 oz (104.2 kg)      Nutrition Goal Pt to identify food quantities necessary to achieve weight loss of 6-24 lb at graduation from cardiac rehab. Pt to identify food quantities necessary to achieve weight loss of 6-24 lb at graduation from cardiac rehab.             Personal Goal #2 Re-Evaluation   Personal Goal #2 Pt to build a healthy plate including vegetables, fruits, whole grains, and low-fat dairy products in a heart healthy meal plan. Pt to build a healthy plate including vegetables, fruits, whole grains, and low-fat dairy products in a heart healthy meal plan.             Personal Goal #3 Re-Evaluation   Personal Goal #3 Pt to continue reading labels and avoiding salt shaker to reduce sodium intake Pt to continue reading labels and avoiding salt  shaker to reduce sodium intake             Nutrition Goals Discharge (Final Nutrition Goals Re-Evaluation):  Nutrition Goals Re-Evaluation - 09/24/20 1043      Goals   Current Weight 229 lb 11.5 oz (104.2 kg)    Nutrition Goal Pt to identify food quantities necessary to achieve weight loss of 6-24 lb at graduation from cardiac rehab.      Personal Goal #2 Re-Evaluation   Personal Goal #2 Pt to build a healthy plate including vegetables, fruits, whole grains, and low-fat dairy products in a heart healthy meal plan.      Personal Goal #3 Re-Evaluation   Personal Goal #3 Pt to continue reading labels and avoiding salt shaker to reduce sodium intake           Psychosocial: Target Goals: Acknowledge presence or absence of significant depression and/or stress, maximize coping skills, provide positive support system. Participant is able to verbalize types and ability to use techniques and skills needed for reducing stress and depression.  Initial Review & Psychosocial Screening:  Initial Psych Review & Screening - 08/24/20 0940      Screening Interventions   Interventions Encouraged to exercise;Provide feedback about  the scores to participant    Expected Outcomes Long Term Goal: Stressors or current issues are controlled or eliminated.;Short Term goal: Identification and review with participant of any Quality of Life or Depression concerns found by scoring the questionnaire.           Quality of Life Scores:  Quality of Life - 08/23/20 1553      Quality of Life   Select Quality of Life      Quality of Life Scores   Health/Function Pre 11.08 %    Socioeconomic Pre 18.33 %    Psych/Spiritual Pre 16.58 %    Family Pre 26.38 %    GLOBAL Pre 15.83 %          Scores of 19 and below usually indicate a poorer quality of life in these areas.  A difference of  2-3 points is a clinically meaningful difference.  A difference of 2-3 points in the total score of the Quality of Life Index has been associated with significant improvement in overall quality of life, self-image, physical symptoms, and general health in studies assessing change in quality of life.  PHQ-9: Recent Review Flowsheet Data    Depression screen [facility] 2/9 08/23/2020 03/05/2020 04/12/2019 07/28/2018 03/15/2018   Decreased Interest 0 0 0 1 0   Down, Depressed, Hopeless 0 0 0 0 0   PHQ - 2 Score 0 0 0 1 0   Altered sleeping 3 - - 3 3   Tired, decreased energy 2 - - 3 3   Change in appetite 0 - - 1 1   Feeling bad or failure about yourself  0 - - 0 0   Trouble concentrating 0 - - 0 0   Moving slowly or fidgety/restless 0 - - 0 0   Suicidal thoughts 0 - - 0 0   PHQ-9 Score 5 - - 8 7   Difficult doing work/chores Not difficult at all - - Not difficult at all Not difficult at all     Interpretation of Total Score  Total Score Depression Severity:  1-4 = Minimal depression, 5-9 = Mild depression, 10-14 = Moderate depression, 15-19 = Moderately severe depression, 20-27 = Severe depression   Psychosocial Evaluation and Intervention:   Psychosocial Re-Evaluation:  Psychosocial Re-Evaluation    Row Name 08/27/20 1627 09/25/20 1355            Psychosocial Re-Evaluation   Current issues with Current Depression Current Depression      Comments Reviewed quality of life scores with the patient. Eric Stokes has been through a lot having recent knee surgery in August and OHS in October Eric Stokes has not voiced any increased concerns or stressors since participating in the program.      Expected Outcomes Patient will have decreased depression upon completion of phase 2 cardiac rehab. Patient will have decreased depression upon completion of phase 2 cardiac rehab.      Interventions Encouraged to attend Cardiac Rehabilitation for the exercise Encouraged to attend Cardiac Rehabilitation for the exercise      Continue Psychosocial Services  No Follow up required No Follow up required             Psychosocial Discharge (Final Psychosocial Re-Evaluation):  Psychosocial Re-Evaluation - 09/25/20 1355      Psychosocial Re-Evaluation   Current issues with Current Depression    Comments Eric Stokes has not voiced any increased concerns or stressors since participating in the program.    Expected Outcomes Patient will have decreased depression upon completion of phase 2 cardiac rehab.    Interventions Encouraged to attend Cardiac Rehabilitation for the exercise    Continue Psychosocial Services  No Follow up required           Vocational Rehabilitation: Provide vocational rehab assistance to qualifying candidates.   Vocational Rehab Evaluation & Intervention:  Vocational Rehab - 08/24/20 0940      Initial Vocational Rehab Evaluation & Intervention   Assessment shows need for Vocational Rehabilitation No   Eric Stokes is retired and does not need vocational rehab at this time          Education: Education Goals: Education classes will be provided on a weekly basis, covering required topics. Participant will state understanding/return demonstration of topics presented.  Learning Barriers/Preferences:  Learning Barriers/Preferences - 08/23/20  1555      Learning Barriers/Preferences   Learning Barriers Sight;Hearing   wears glasses and hearing aides   Learning Preferences Written Material;Video;Verbal Instruction;Audio;Computer/Internet;Group Instruction;Individual Instruction;Pictoral;Skilled Demonstration           Education Topics: Hypertension, Hypertension Reduction -Define heart disease and high blood pressure. Discus how high blood pressure affects the body and ways to reduce high blood pressure.   Exercise and Your Heart -Discuss why it is important to exercise, the FITT principles of exercise, normal and abnormal responses to exercise, and how to exercise safely.   Angina -Discuss definition of angina, causes of angina, treatment of angina, and how to decrease risk of having angina.   Cardiac Medications -Review what the following cardiac medications are used for, how they affect the body, and side effects that may occur when taking the medications.  Medications include Aspirin, Beta blockers, calcium channel blockers, ACE Inhibitors, angiotensin receptor blockers, diuretics, digoxin, and antihyperlipidemics.   Congestive Heart Failure -Discuss the definition of CHF, how to live with CHF, the signs and symptoms of CHF, and how keep track of weight and sodium intake.   Heart Disease and Intimacy -Discus the effect sexual activity has on the heart, how changes occur during intimacy as we age, and safety during sexual activity.   Smoking Cessation / COPD -Discuss different methods to quit smoking, the health benefits of quitting smoking, and the definition of COPD.   Nutrition I: Fats -Discuss the  types of cholesterol, what cholesterol does to the heart, and how cholesterol levels can be controlled.   Nutrition II: Labels -Discuss the different components of food labels and how to read food label   Heart Parts/Heart Disease and PAD -Discuss the anatomy of the heart, the pathway of blood circulation  through the heart, and these are affected by heart disease.   Stress I: Signs and Symptoms -Discuss the causes of stress, how stress may lead to anxiety and depression, and ways to limit stress.   Stress II: Relaxation -Discuss different types of relaxation techniques to limit stress.   Warning Signs of Stroke / TIA -Discuss definition of a stroke, what the signs and symptoms are of a stroke, and how to identify when someone is having stroke.   Knowledge Questionnaire Score:  Knowledge Questionnaire Score - 08/23/20 1557      Knowledge Questionnaire Score   Pre Score 21/24           Core Components/Risk Factors/Patient Goals at Admission:  Personal Goals and Risk Factors at Admission - 08/24/20 0941      Core Components/Risk Factors/Patient Goals on Admission    Weight Management Yes;Obesity;Weight Loss    Intervention Weight Management: Develop a combined nutrition and exercise program designed to reach desired caloric intake, while maintaining appropriate intake of nutrient and fiber, sodium and fats, and appropriate energy expenditure required for the weight goal.;Weight Management: Provide education and appropriate resources to help participant work on and attain dietary goals.;Weight Management/Obesity: Establish reasonable short term and long term weight goals.;Obesity: Provide education and appropriate resources to help participant work on and attain dietary goals.    Admit Weight 227 lb 1.2 oz (103 kg)    Expected Outcomes Short Term: Continue to assess and modify interventions until short term weight is achieved;Long Term: Adherence to nutrition and physical activity/exercise program aimed toward attainment of established weight goal;Weight Maintenance: Understanding of the daily nutrition guidelines, which includes 25-35% calories from fat, 7% or less cal from saturated fats, less than 200mg  cholesterol, less than 1.5gm of sodium, & 5 or more servings of fruits and  vegetables daily;Weight Loss: Understanding of general recommendations for a balanced deficit meal plan, which promotes 1-2 lb weight loss per week and includes a negative energy balance of [phone] kcal/d;Understanding recommendations for meals to include 15-35% energy as protein, 25-35% energy from fat, 35-60% energy from carbohydrates, less than 200mg  of dietary cholesterol, 20-35 gm of total fiber daily;Understanding of distribution of calorie intake throughout the day with the consumption of 4-5 meals/snacks    Heart Failure Yes    Intervention Provide a combined exercise and nutrition program that is supplemented with education, support and counseling about heart failure. Directed toward relieving symptoms such as shortness of breath, decreased exercise tolerance, and extremity edema.    Expected Outcomes Short term: Attendance in program 2-3 days a week with increased exercise capacity. Reported lower sodium intake. Reported increased fruit and vegetable intake. Reports medication compliance.;Short term: Daily weights obtained and reported for increase. Utilizing diuretic protocols set by physician.;Long term: Adoption of self-care skills and reduction of barriers for early signs and symptoms recognition and intervention leading to self-care maintenance.    Hypertension Yes    Intervention Provide education on lifestyle modifcations including regular physical activity/exercise, weight management, moderate sodium restriction and increased consumption of fresh fruit, vegetables, and low fat dairy, alcohol moderation, and smoking cessation.;Monitor prescription use compliance.    Expected Outcomes Short Term: Continued assessment and intervention until  BP is < 140/23mm HG in hypertensive participants. < 130/44mm HG in hypertensive participants with diabetes, heart failure or chronic kidney disease.;Long Term: Maintenance of blood pressure at goal levels.    Lipids Yes    Intervention Provide education  and support for participant on nutrition & aerobic/resistive exercise along with prescribed medications to achieve LDL 70mg , HDL >40mg .    Expected Outcomes Short Term: Participant states understanding of desired cholesterol values and is compliant with medications prescribed. Participant is following exercise prescription and nutrition guidelines.;Long Term: Cholesterol controlled with medications as prescribed, with individualized exercise RX and with personalized nutrition plan. Value goals: LDL < 70mg , HDL > 40 mg.           Core Components/Risk Factors/Patient Goals Review:   Goals and Risk Factor Review    Row Name 08/27/20 1628 08/27/20 1629 09/25/20 1356         Core Components/Risk Factors/Patient Goals Review   Personal Goals Review -- Weight Management/Obesity;Heart Failure;Hypertension;Lipids Weight Management/Obesity;Heart Failure;Hypertension;Lipids     Review Eric Stokes did well on his first day of exercise. VSS. Patient did report some mild knee pain Eric Stokes did well on his first day of exercise. VSS. Patient did report some mild knee pain will continue to monitor Eric Stokes has been doing well with exercise at cardiac rehab. Eric Stokes's vital sings have been stable. Eric Stokes says the his knee feels better     Expected Outcomes -- Eric Stokes will continue to participater in phase 2 cardiac rehab for exercise, nutrtition and lifestyle modifications Eric Stokes will continue to participater in phase 2 cardiac rehab for exercise, nutrtition and lifestyle modifications            Core Components/Risk Factors/Patient Goals at Discharge (Final Review):   Goals and Risk Factor Review - 09/25/20 1356      Core Components/Risk Factors/Patient Goals Review   Personal Goals Review Weight Management/Obesity;Heart Failure;Hypertension;Lipids    Review Eric Stokes has been doing well with exercise at cardiac rehab. Eric Stokes's vital sings have been stable. Eric Stokes says the his knee feels better    Expected Outcomes  Eric Stokes will continue to participater in phase 2 cardiac rehab for exercise, nutrtition and lifestyle modifications           ITP Comments:  ITP Comments    Row Name 08/24/20 0931 08/27/20 1626 09/25/20 1353       ITP Comments Dr Fransico Him MD, Medical Director 30 Day ITP Reivew. Eric Stokes did well on his first day of exercise on 08/27/20. 30 Day ITP Reivew. Eric Stokes has good attendance and participaiton in phase 2 cardiac rehab. Eric Stokes reports feeling stronger since he has been participating in the program            Comments: See ITP comments.Eric Pall, RN,BSN 09/25/2020 1:59 PM

## 2020-09-26 ENCOUNTER — Other Ambulatory Visit: Payer: Self-pay

## 2020-09-26 ENCOUNTER — Encounter (HOSPITAL_COMMUNITY)
Admission: RE | Admit: 2020-09-26 | Discharge: 2020-09-26 | Disposition: A | Discharge status: Home / Self Care | Payer: No Typology Code available for payment source | Source: Ambulatory Visit | Attending: Internal Medicine | Admitting: Internal Medicine

## 2020-09-26 DIAGNOSIS — I25810 Atherosclerosis of coronary artery bypass graft(s) without angina pectoris: Secondary | ICD-10-CM | POA: Diagnosis not present

## 2020-09-26 DIAGNOSIS — Z951 Presence of aortocoronary bypass graft: Secondary | ICD-10-CM

## 2020-09-26 DIAGNOSIS — I214 Non-ST elevation (NSTEMI) myocardial infarction: Secondary | ICD-10-CM

## 2020-09-28 ENCOUNTER — Encounter (HOSPITAL_COMMUNITY)
Admission: RE | Admit: 2020-09-28 | Discharge: 2020-09-28 | Disposition: A | Discharge status: Home / Self Care | Payer: No Typology Code available for payment source | Source: Ambulatory Visit | Attending: Internal Medicine | Admitting: Internal Medicine

## 2020-09-28 ENCOUNTER — Other Ambulatory Visit: Payer: Self-pay

## 2020-09-28 DIAGNOSIS — I214 Non-ST elevation (NSTEMI) myocardial infarction: Secondary | ICD-10-CM

## 2020-09-28 DIAGNOSIS — Z951 Presence of aortocoronary bypass graft: Secondary | ICD-10-CM

## 2020-09-28 DIAGNOSIS — I25810 Atherosclerosis of coronary artery bypass graft(s) without angina pectoris: Secondary | ICD-10-CM | POA: Diagnosis not present

## 2020-10-01 ENCOUNTER — Other Ambulatory Visit: Payer: Self-pay

## 2020-10-01 ENCOUNTER — Encounter (HOSPITAL_COMMUNITY)
Admission: RE | Admit: 2020-10-01 | Discharge: 2020-10-01 | Disposition: A | Discharge status: Home / Self Care | Payer: No Typology Code available for payment source | Source: Ambulatory Visit | Attending: Internal Medicine | Admitting: Internal Medicine

## 2020-10-01 DIAGNOSIS — I214 Non-ST elevation (NSTEMI) myocardial infarction: Secondary | ICD-10-CM

## 2020-10-01 DIAGNOSIS — Z951 Presence of aortocoronary bypass graft: Secondary | ICD-10-CM

## 2020-10-01 DIAGNOSIS — I25810 Atherosclerosis of coronary artery bypass graft(s) without angina pectoris: Secondary | ICD-10-CM | POA: Diagnosis not present

## 2020-10-03 ENCOUNTER — Other Ambulatory Visit: Payer: Self-pay

## 2020-10-03 ENCOUNTER — Encounter (HOSPITAL_COMMUNITY)
Admission: RE | Admit: 2020-10-03 | Discharge: 2020-10-03 | Disposition: A | Discharge status: Home / Self Care | Payer: No Typology Code available for payment source | Source: Ambulatory Visit | Attending: Internal Medicine | Admitting: Internal Medicine

## 2020-10-03 DIAGNOSIS — I25810 Atherosclerosis of coronary artery bypass graft(s) without angina pectoris: Secondary | ICD-10-CM | POA: Diagnosis not present

## 2020-10-03 DIAGNOSIS — Z951 Presence of aortocoronary bypass graft: Secondary | ICD-10-CM

## 2020-10-03 DIAGNOSIS — I214 Non-ST elevation (NSTEMI) myocardial infarction: Secondary | ICD-10-CM

## 2020-10-05 ENCOUNTER — Other Ambulatory Visit: Payer: Self-pay

## 2020-10-05 ENCOUNTER — Encounter (HOSPITAL_COMMUNITY)
Admission: RE | Admit: 2020-10-05 | Discharge: 2020-10-05 | Disposition: A | Discharge status: Home / Self Care | Payer: No Typology Code available for payment source | Source: Ambulatory Visit | Attending: Internal Medicine | Admitting: Internal Medicine

## 2020-10-05 DIAGNOSIS — Z951 Presence of aortocoronary bypass graft: Secondary | ICD-10-CM

## 2020-10-05 DIAGNOSIS — I214 Non-ST elevation (NSTEMI) myocardial infarction: Secondary | ICD-10-CM

## 2020-10-05 DIAGNOSIS — I25810 Atherosclerosis of coronary artery bypass graft(s) without angina pectoris: Secondary | ICD-10-CM | POA: Diagnosis not present

## 2020-10-08 ENCOUNTER — Encounter (HOSPITAL_COMMUNITY): Payer: No Typology Code available for payment source

## 2020-10-08 ENCOUNTER — Other Ambulatory Visit: Payer: Self-pay

## 2020-10-08 ENCOUNTER — Encounter (HOSPITAL_COMMUNITY)
Admission: RE | Admit: 2020-10-08 | Discharge: 2020-10-08 | Disposition: A | Discharge status: Home / Self Care | Payer: No Typology Code available for payment source | Source: Ambulatory Visit | Attending: Internal Medicine | Admitting: Internal Medicine

## 2020-10-08 DIAGNOSIS — Z951 Presence of aortocoronary bypass graft: Secondary | ICD-10-CM

## 2020-10-08 DIAGNOSIS — I25810 Atherosclerosis of coronary artery bypass graft(s) without angina pectoris: Secondary | ICD-10-CM | POA: Diagnosis not present

## 2020-10-08 DIAGNOSIS — I214 Non-ST elevation (NSTEMI) myocardial infarction: Secondary | ICD-10-CM

## 2020-10-10 ENCOUNTER — Encounter (HOSPITAL_COMMUNITY)
Admission: RE | Admit: 2020-10-10 | Discharge: 2020-10-10 | Disposition: A | Discharge status: Home / Self Care | Payer: No Typology Code available for payment source | Source: Ambulatory Visit | Attending: Internal Medicine | Admitting: Internal Medicine

## 2020-10-10 ENCOUNTER — Encounter (HOSPITAL_COMMUNITY): Payer: No Typology Code available for payment source

## 2020-10-10 ENCOUNTER — Other Ambulatory Visit: Payer: Self-pay

## 2020-10-10 DIAGNOSIS — I25810 Atherosclerosis of coronary artery bypass graft(s) without angina pectoris: Secondary | ICD-10-CM | POA: Diagnosis not present

## 2020-10-10 DIAGNOSIS — I214 Non-ST elevation (NSTEMI) myocardial infarction: Secondary | ICD-10-CM

## 2020-10-10 DIAGNOSIS — Z951 Presence of aortocoronary bypass graft: Secondary | ICD-10-CM

## 2020-10-12 ENCOUNTER — Encounter (HOSPITAL_COMMUNITY): Payer: No Typology Code available for payment source

## 2020-10-12 ENCOUNTER — Other Ambulatory Visit: Payer: Self-pay

## 2020-10-12 ENCOUNTER — Encounter (HOSPITAL_COMMUNITY)
Admission: RE | Admit: 2020-10-12 | Discharge: 2020-10-12 | Disposition: A | Discharge status: Home / Self Care | Payer: No Typology Code available for payment source | Source: Ambulatory Visit | Attending: Internal Medicine | Admitting: Internal Medicine

## 2020-10-12 DIAGNOSIS — Z951 Presence of aortocoronary bypass graft: Secondary | ICD-10-CM

## 2020-10-12 DIAGNOSIS — I25810 Atherosclerosis of coronary artery bypass graft(s) without angina pectoris: Secondary | ICD-10-CM | POA: Diagnosis not present

## 2020-10-12 DIAGNOSIS — I214 Non-ST elevation (NSTEMI) myocardial infarction: Secondary | ICD-10-CM

## 2020-10-15 ENCOUNTER — Other Ambulatory Visit: Payer: Self-pay

## 2020-10-15 ENCOUNTER — Encounter (HOSPITAL_COMMUNITY)
Admission: RE | Admit: 2020-10-15 | Discharge: 2020-10-15 | Disposition: A | Discharge status: Home / Self Care | Payer: No Typology Code available for payment source | Source: Ambulatory Visit | Attending: Internal Medicine | Admitting: Internal Medicine

## 2020-10-15 VITALS — Ht 67.25 in | Wt 224.6 lb

## 2020-10-15 DIAGNOSIS — I25810 Atherosclerosis of coronary artery bypass graft(s) without angina pectoris: Secondary | ICD-10-CM | POA: Diagnosis not present

## 2020-10-15 DIAGNOSIS — Z951 Presence of aortocoronary bypass graft: Secondary | ICD-10-CM

## 2020-10-15 DIAGNOSIS — I214 Non-ST elevation (NSTEMI) myocardial infarction: Secondary | ICD-10-CM

## 2020-10-16 NOTE — Progress Notes (Signed)
Cardiac Individual Treatment Plan  Patient Details  Name: Eric Stokes MRN: [id number] Date of Birth: [birth year] Referring Provider:   Flowsheet Row CARDIAC REHAB PHASE II ORIENTATION from 08/23/2020 in [facility]  Referring Provider Dr Krist[facility] / Dr Fransico Him MD, Covering      Initial Encounter Date:  Page from 08/23/2020 in [facility]  Date 08/23/20      Visit Diagnosis: NSTEMI (non-ST elevated myocardial infarction) ([place]) 05/16/20  S/P CABG x 4, [facility] Procedure 05/18/2020  Patient's Home Medications on Admission:  Current Outpatient Medications:  .  alfuzosin (UROXATRAL) 10 MG 24 hr tablet, Take 10 mg by mouth daily with breakfast., Disp: , Rfl:  .  amiodarone (PACERONE) 200 MG tablet, TAKE 1 TABLET BY MOUTH EVERY DAY, Disp: 30 tablet, Rfl: 1 .  apixaban (ELIQUIS) 5 MG TABS tablet, Take 1 tablet (5 mg total) by mouth 2 (two) times daily., Disp: 180 tablet, Rfl: 3 .  aspirin 81 MG EC tablet, Take 1 tablet (81 mg total) by mouth daily. Swallow whole., Disp: 90 tablet, Rfl: 3 .  carboxymethylcellulose (REFRESH PLUS) 0.5 % SOLN, Place 1 drop into both eyes 2 (two) times daily as needed (dry eyes)., Disp: , Rfl:  .  cetirizine (ZYRTEC) 10 MG tablet, Take 10 mg by mouth daily., Disp: , Rfl:  .  Cholecalciferol (VITAMIN D3) 2000 UNITS TABS, Take 2,000 Int'l Units by mouth daily. , Disp: , Rfl:  .  empagliflozin (JARDIANCE) 10 MG TABS tablet, Take 1 tablet (10 mg total) by mouth daily., Disp: 90 tablet, Rfl: 3 .  finasteride (PROSCAR) 5 MG tablet, Take 5 mg by mouth daily., Disp: , Rfl:  .  fluticasone (FLONASE) 50 MCG/ACT nasal spray, USE TWO SPRAYS IN EACH NOSTRIL DAILY AS NEEDED. (Patient taking differently: Place 2 sprays into both nostrils daily as needed for allergies.), Disp: 48 mL, Rfl: 0 .  furosemide (LASIX) 80 MG tablet, Take 0.5 tablets (40 mg  total) by mouth daily as needed. (Patient taking differently: Take 40 mg by mouth daily as needed for fluid or edema.), Disp: 45 tablet, Rfl: 3 .  latanoprost (XALATAN) 0.005 % ophthalmic solution, INSTILL 1 DROP INTO BOTH EYES AT BEDTIME, Disp: 11.25 mL, Rfl: 1 .  montelukast (SINGULAIR) 10 MG tablet, TAKE 1 TABLET BY MOUTH EVERY DAY AT BEDTIME, Disp: 90 tablet, Rfl: 3 .  NON FORMULARY, Cpap at night, Disp: , Rfl:  .  rosuvastatin (CRESTOR) 20 MG tablet, Take 1 tablet (20 mg total) by mouth daily., Disp: 90 tablet, Rfl: 3 .  sacubitril-valsartan (ENTRESTO) 24-26 MG, Take 1 tablet by mouth 2 (two) times daily., Disp: 180 tablet, Rfl: 3 .  spironolactone (ALDACTONE) 25 MG tablet, TAKE 1 TABLET BY MOUTH EVERY DAY, Disp: 90 tablet, Rfl: 1  Past Medical History: Past Medical History:  Diagnosis Date  . Aortic stenosis, mild    noted on ECHO  . Asthma   . Benign essential hypertension, on Norvasc and Amlodipine 12/21/2008   Followed by [facility]   . BPH (benign prostatic hyperplasia) 09/03/2012  . Carotid bruit present 06/15/2018   2015 Carotid Dopplers:  Essentially normal carotid arteries, with very slight hard plaque in the proximal ICA's and serpentine distal vessels. 1-39% bilateral ICA stenosis. Patent vertebral arteries with antegrade flow. Normal subclavian arteries, bilaterally.  . Colonic polyp 01/09/2020  . COPD (chronic obstructive pulmonary disease) ([place])   . Coronary  artery disease   . Dyslipidemia 12/21/2008   Qualifier: Diagnosis of  By: Percival Spanish, MD, Farrel Gordon    . Essential hypertension, benign 12/21/2008   Qualifier: Diagnosis of  By: Percival Spanish, MD, Farrel Gordon    . Former smoker, stopped smoking in distant past 03/15/2018  . Hematuria 09/16/2011  . History of migraine   . Hyperlipidemia   . OA (osteoarthritis)   . Obese   . OSA on CPAP 09/25/2015   Diagnosed at the [facility] 2016   . Prostate cancer ([place]) 11/30/2019  . PVC's (premature ventricular contractions)    . Seasonal allergies   . Sinus bradycardia   . Squamous cell carcinoma of skin   . TIA (transient ischemic attack)    questionable  . Vitamin B 12 deficiency   . Vitamin D deficiency 11/26/2010    Tobacco Use: Social History   Tobacco Use  Smoking Status Former Smoker  . Types: Cigarettes  . Quit date: 08/18/1965  . Years since quitting: 55.2  Smokeless Tobacco Never Used  Tobacco Comment   pt quit smoking 53 yrs ago    Labs: Recent Review Flowsheet Data    Labs for ITP Cardiac and Pulmonary Rehab Latest Ref Rng & Units 05/26/2020 05/27/2020 05/28/2020 05/29/2020 05/30/2020   Cholestrol 0 - 200 mg/dL - - - - -   LDLCALC 0 - 99 mg/dL - - - - -   LDLDIRECT <100 mg/dL - - - - -   HDL >40 mg/dL - - - - -   Trlycerides <150 mg/dL - - - - -   Hemoglobin A1c 4.8 - 5.6 % - - - - -   PHART 7.350 - 7.450 - - - - -   PCO2ART 32.0 - 48.0 mmHg - - - - -   HCO3 20.0 - 28.0 mmol/L - - - - -   TCO2 22 - 32 mmol/L - - - - -   ACIDBASEDEF 0.0 - 2.0 mmol/L - - - - -   O2SAT % 59.6 71.1 62.8 73.8 72.4      Capillary Blood Glucose: Lab Results  Component Value Date   GLUCAP 131 (H) 05/30/2020   GLUCAP 122 (H) 05/30/2020   GLUCAP 106 (H) 05/30/2020   GLUCAP 93 05/30/2020   GLUCAP 121 (H) 05/29/2020     Exercise Target Goals: Exercise Program Goal: Individual exercise prescription set using results from initial 6 min walk test and THRR while considering  patient's activity barriers and safety.   Exercise Prescription Goal: Starting with aerobic activity 30 plus minutes a day, 3 days per week for initial exercise prescription. Provide home exercise prescription and guidelines that participant acknowledges understanding prior to discharge.  Activity Barriers & Risk Stratification:  Activity Barriers & Cardiac Risk Stratification - 08/23/20 1543      Activity Barriers & Cardiac Risk Stratification   Activity Barriers Back Problems;Right Knee Replacement;Joint  Problems;Deconditioning;Muscular Weakness;Shortness of Breath;Balance Concerns;Assistive Device    Cardiac Risk Stratification High           6 Minute Walk:  6 Minute Walk    Row Name 08/23/20 1449 10/15/20 1428       6 Minute Walk   Phase - Discharge    Distance 812 feet 950 feet    Distance % Change - 17 %    Distance Feet Change - 138 ft    Walk Time 6 minutes 6 minutes    # of Rest Breaks 0 0    MPH  1.54 1.8    METS 1.12 1.44    RPE 11 13    Perceived Dyspnea  1 0    VO2 Peak 3.91 5.03    Symptoms Yes (comment) Yes (comment)    Comments Knee pain 3/10, SOB RPD 1 Knee pain 3/10    Resting HR 76 bpm 71 bpm    Resting BP 118/56 112/62    Resting Oxygen Saturation  98 % 96 %    Exercise Oxygen Saturation  during 6 min walk 97 % 98 %    Max Ex. HR 83 bpm 85 bpm    Max Ex. BP 124/70 130/74    2 Minute Post BP 120/68 -           Oxygen Initial Assessment:   Oxygen Re-Evaluation:   Oxygen Discharge (Final Oxygen Re-Evaluation):   Initial Exercise Prescription:  Initial Exercise Prescription - 08/23/20 1500      Date of Initial Exercise RX and Referring Provider   Date 08/23/20    Referring Provider Dr Kristopher Oppenheim [facility]/ Dr Fransico Him MD, Covering    Expected Discharge Date 10/19/20      NuStep   Level 1    SPM 85    Minutes 30    METs 1.6      Prescription Details   Frequency (times per week) 3    Duration Progress to 30 minutes of continuous aerobic without signs/symptoms of physical distress      Intensity   THRR 40-80% of Max Heartrate 58-116    Ratings of Perceived Exertion 11-13    Perceived Dyspnea 0-4      Progression   Progression Continue progressive overload as per policy without signs/symptoms or physical distress.      Resistance Training   Training Prescription Yes    Weight 3    Reps 10-15           Perform Capillary Blood Glucose checks as needed.  Exercise Prescription Changes:  Exercise Prescription  Changes    Row Name 08/27/20 1400 09/12/20 1500 09/24/20 1445 10/12/20 1500       Response to Exercise   Blood Pressure (Admit) 122/58 137/77 124/70 114/54    Blood Pressure (Exercise) 116/58 130/68 118/70 108/54    Blood Pressure (Exit) 118/68 126/60 114/70 100/64    Heart Rate (Admit) 84 bpm 76 bpm 76 bpm 76 bpm    Heart Rate (Exercise) 82 bpm 76 bpm 79 bpm 75 bpm    Heart Rate (Exit) 74 bpm 68 bpm 60 bpm 79 bpm    Rating of Perceived Exertion (Exercise) 11 12 11 12     Symptoms None None None None    Comments Pt's first day of exercise in the CRP2 program Reviewed METs and Home exercise Rx Reviewed goals and METs Reviewed METS    Duration Progress to 30 minutes of  aerobic without signs/symptoms of physical distress Continue with 30 min of aerobic exercise without signs/symptoms of physical distress. Continue with 30 min of aerobic exercise without signs/symptoms of physical distress. Continue with 30 min of aerobic exercise without signs/symptoms of physical distress.    Intensity THRR unchanged THRR unchanged THRR unchanged THRR unchanged         Progression   Progression Continue to progress workloads to maintain intensity without signs/symptoms of physical distress. Continue to progress workloads to maintain intensity without signs/symptoms of physical distress. Continue to progress workloads to maintain intensity without signs/symptoms of physical distress. Continue to progress workloads  to maintain intensity without signs/symptoms of physical distress.    Average METs 1.3 1.6 1.9 1.9         Resistance Training   Training Prescription Yes No Yes Yes    Weight 3 No weights on wednesdays 3 lbs 3 lbs    Reps 10-15 - 10-15 10-15    Time 10 Minutes - 10 Minutes 10 Minutes         Interval Training   Interval Training No No No No         NuStep   Level 1 2 3 3     SPM 70 75 75 75    Minutes 25 30 30 30     METs 1.3 1.6 1.9 1.9         Home Exercise Plan   Plans to continue  exercise at - Home (comment) Home (comment) Home (comment)    Frequency - Add 2 additional days to program exercise sessions. Add 2 additional days to program exercise sessions. Add 2 additional days to program exercise sessions.    Initial Home Exercises Provided - 09/12/20 09/12/20 09/12/20           Exercise Comments:  Exercise Comments    Row Name 08/27/20 1452 09/12/20 1452 09/24/20 1437 10/12/20 1500     Exercise Comments Pt's first day of exercise in the CRP2 program. Pt did not keep SPM on the nustep above 70 because he was afraid he would get short of breath. Will continue to work with patient to build stamina and confidence. Reviewed METs and home exercise Rx with patient. Pt has treadmill at home which he will walk on 2-3x/week for 30 minutes. Reviewed METs and goals with patient. Pt voices he is progressing to his goal of feeling stronger and having more stamina for the exercise and other activites. Reviewed METs. Pt progress is slow. His baseline SOB and knee problems has slowed his progress. He is feeling stonger. He voices his plan is to join [employer] and continue his exercise there. Congratulated patient on his commintment to continue his exercise outisde the CRP2 program.           Exercise Goals and Review:  Exercise Goals    Row Name 08/23/20 1548             Exercise Goals   Increase Physical Activity Yes       Intervention Provide advice, education, support and counseling about physical activity/exercise needs.;Develop an individualized exercise prescription for aerobic and resistive training based on initial evaluation findings, risk stratification, comorbidities and participant's personal goals.       Expected Outcomes Short Term: Attend rehab on a regular basis to increase amount of physical activity.;Long Term: Add in home exercise to make exercise part of routine and to increase amount of physical activity.;Long Term: Exercising regularly at least 3-5  days a week.       Increase Strength and Stamina Yes       Able to understand and use rate of perceived exertion (RPE) scale Yes       Intervention Provide education and explanation on how to use RPE scale       Expected Outcomes Short Term: Able to use RPE daily in rehab to express subjective intensity level;Long Term:  Able to use RPE to guide intensity level when exercising independently       Able to understand and use Dyspnea scale Yes       Intervention Provide education and explanation on  how to use Dyspnea scale       Expected Outcomes Short Term: Able to use Dyspnea scale daily in rehab to express subjective sense of shortness of breath during exertion;Long Term: Able to use Dyspnea scale to guide intensity level when exercising independently       Knowledge and understanding of Target Heart Rate Range (THRR) Yes       Intervention Provide education and explanation of THRR including how the numbers were predicted and where they are located for reference       Expected Outcomes Short Term: Able to state/look up THRR;Short Term: Able to use daily as guideline for intensity in rehab;Long Term: Able to use THRR to govern intensity when exercising independently       Understanding of Exercise Prescription Yes       Intervention Provide education, explanation, and written materials on patient's individual exercise prescription       Expected Outcomes Short Term: Able to explain program exercise prescription;Long Term: Able to explain home exercise prescription to exercise independently              Exercise Goals Re-Evaluation :  Exercise Goals Re-Evaluation    Row Name 08/27/20 1451 09/12/20 1450 09/24/20 1033 09/24/20 1435       Exercise Goal Re-Evaluation   Exercise Goals Review Increase Physical Activity;Increase Strength and Stamina;Able to understand and use rate of perceived exertion (RPE) scale;Knowledge and understanding of Target Heart Rate Range (THRR);Understanding of Exercise  Prescription Increase Physical Activity;Increase Strength and Stamina;Able to understand and use rate of perceived exertion (RPE) scale;Knowledge and understanding of Target Heart Rate Range (THRR);Understanding of Exercise Prescription - Increase Physical Activity;Increase Strength and Stamina;Able to understand and use rate of perceived exertion (RPE) scale;Knowledge and understanding of Target Heart Rate Range (THRR);Understanding of Exercise Prescription    Comments Pt's first day of execise in the CRP2 program, Pt understnads the RPE scale, THRR, and Exercise Rx. Reviewed METs and Home exercise Rx with patient. Pt will use his treadmill at home 2-3x/week for 30 minutes. Pt verbalized understanding of the home exercise Rx and was provided a copy. - Reviewed Goals and METS. Pt voices that he feels he is making progress toward his goals. He voices feeling stronger and has more stamina. Encouraging walking at home to supplement the CRP2 program to help increase CV fitness.    Expected Outcomes Will continue to monitor patient and progress exercise workloads as tolerated. Pt will exercise at home on his own on his off days from the [facility] program - Will continue to monitor patient and progress workloads as tolerated.            Discharge Exercise Prescription (Final Exercise Prescription Changes):  Exercise Prescription Changes - 10/12/20 1500      Response to Exercise   Blood Pressure (Admit) 114/54    Blood Pressure (Exercise) 108/54    Blood Pressure (Exit) 100/64    Heart Rate (Admit) 76 bpm    Heart Rate (Exercise) 75 bpm    Heart Rate (Exit) 79 bpm    Rating of Perceived Exertion (Exercise) 12    Symptoms None    Comments Reviewed METS    Duration Continue with 30 min of aerobic exercise without signs/symptoms of physical distress.    Intensity THRR unchanged      Progression   Progression Continue to progress workloads to maintain intensity without signs/symptoms of physical distress.     Average METs 1.9      Resistance  Training   Training Prescription Yes    Weight 3 lbs    Reps 10-15    Time 10 Minutes      Interval Training   Interval Training No      NuStep   Level 3    SPM 75    Minutes 30    METs 1.9      Home Exercise Plan   Plans to continue exercise at Home (comment)    Frequency Add 2 additional days to program exercise sessions.    Initial Home Exercises Provided 09/12/20           Nutrition:  Target Goals: Understanding of nutrition guidelines, daily intake of sodium 1500mg , cholesterol 200mg , calories 30% from fat and 7% or less from saturated fats, daily to have 5 or more servings of fruits and vegetables.  Biometrics:  Pre Biometrics - 08/23/20 1550      Pre Biometrics   Height 5' 7.25" (1.708 m)    Weight 103 kg    Waist Circumference 47.5 inches    Hip Circumference 47 inches    Waist to Hip Ratio 1.01 %    BMI (Calculated) 35.31    Triceps Skinfold 14 mm    % Body Fat 34 %    Grip Strength 50 kg    Flexibility 15 in    Single Leg Stand --   Not done. Pt uses cane          Post Biometrics - 10/15/20 1445       Post  Biometrics   Height 5' 7.25" (1.708 m)    Weight 101.9 kg    Waist Circumference 46.5 inches    Hip Circumference 47 inches    Waist to Hip Ratio 0.99 %    BMI (Calculated) 34.93    Triceps Skinfold 13 mm    % Body Fat 33.1 %    Grip Strength 54 kg    Flexibility 16 in    Single Leg Stand --   Not done          Nutrition Therapy Plan and Nutrition Goals:  Nutrition Therapy & Goals - 09/07/20 1441      Nutrition Therapy   Diet TLC; low sodium    Drug/Food Interactions Statins/Certain Fruits      Personal Nutrition Goals   Nutrition Goal Pt to identify food quantities necessary to achieve weight loss of 6-24 lb at graduation from cardiac rehab.    Personal Goal #2 Pt to build a healthy plate including vegetables, fruits, whole grains, and low-fat dairy products in a heart healthy meal plan.     Personal Goal #3 Pt to continue reading labels and avoiding salt shaker to reduce sodium intake      Intervention Plan   Intervention Prescribe, educate and counsel regarding individualized specific dietary modifications aiming towards targeted core components such as weight, hypertension, lipid management, diabetes, heart failure and other comorbidities.    Expected Outcomes Short Term Goal: Understand basic principles of dietary content, such as calories, fat, sodium, cholesterol and nutrients.           Nutrition Assessments:  MEDIFICTS Score Key:  ?70 Need to make dietary changes   40-70 Heart Healthy Diet  ? 40 Therapeutic Level Cholesterol Diet   Picture Your Plate Scores:  <02 Unhealthy dietary pattern with much room for improvement.  41-50 Dietary pattern unlikely to meet recommendations for good health and room for improvement.  51-60 More healthful dietary pattern, with some  room for improvement.   >60 Healthy dietary pattern, although there may be some specific behaviors that could be improved.    Nutrition Goals Re-Evaluation:  Nutrition Goals Re-Evaluation    [place] Name 09/07/20 1442 09/24/20 1043 10/12/20 1208         Goals   Current Weight 227 lb (103 kg) 229 lb 11.5 oz (104.2 kg) 227 lb 15.3 oz (103.4 kg)     Nutrition Goal Pt to identify food quantities necessary to achieve weight loss of 6-24 lb at graduation from cardiac rehab. Pt to identify food quantities necessary to achieve weight loss of 6-24 lb at graduation from cardiac rehab. Pt to identify food quantities necessary to achieve weight loss of 6-24 lb at graduation from cardiac rehab.     Comment - - 2 lb weight loss     Expected Outcome - - Weight loss           Personal Goal #2 Re-Evaluation   Personal Goal #2 Pt to build a healthy plate including vegetables, fruits, whole grains, and low-fat dairy products in a heart healthy meal plan. Pt to build a healthy plate including vegetables,  fruits, whole grains, and low-fat dairy products in a heart healthy meal plan. Pt to build a healthy plate including vegetables, fruits, whole grains, and low-fat dairy products in a heart healthy meal plan.           Personal Goal #3 Re-Evaluation   Personal Goal #3 Pt to continue reading labels and avoiding salt shaker to reduce sodium intake Pt to continue reading labels and avoiding salt shaker to reduce sodium intake Pt to continue reading labels and avoiding salt shaker to reduce sodium intake            Nutrition Goals Discharge (Final Nutrition Goals Re-Evaluation):  Nutrition Goals Re-Evaluation - 10/12/20 1208      Goals   Current Weight 227 lb 15.3 oz (103.4 kg)    Nutrition Goal Pt to identify food quantities necessary to achieve weight loss of 6-24 lb at graduation from cardiac rehab.    Comment 2 lb weight loss    Expected Outcome Weight loss      Personal Goal #2 Re-Evaluation   Personal Goal #2 Pt to build a healthy plate including vegetables, fruits, whole grains, and low-fat dairy products in a heart healthy meal plan.      Personal Goal #3 Re-Evaluation   Personal Goal #3 Pt to continue reading labels and avoiding salt shaker to reduce sodium intake           Psychosocial: Target Goals: Acknowledge presence or absence of significant depression and/or stress, maximize coping skills, provide positive support system. Participant is able to verbalize types and ability to use techniques and skills needed for reducing stress and depression.  Initial Review & Psychosocial Screening:  Initial Psych Review & Screening - 08/24/20 0940      Screening Interventions   Interventions Encouraged to exercise;Provide feedback about the scores to participant    Expected Outcomes Long Term Goal: Stressors or current issues are controlled or eliminated.;Short Term goal: Identification and review with participant of any Quality of Life or Depression concerns found by scoring the  questionnaire.           Quality of Life Scores:  Quality of Life - 08/23/20 1553      Quality of Life   Select Quality of Life      Quality of Life Scores   Health/Function Pre  11.08 %    Socioeconomic Pre 18.33 %    Psych/Spiritual Pre 16.58 %    Family Pre 26.38 %    GLOBAL Pre 15.83 %          Scores of 19 and below usually indicate a poorer quality of life in these areas.  A difference of  2-3 points is a clinically meaningful difference.  A difference of 2-3 points in the total score of the Quality of Life Index has been associated with significant improvement in overall quality of life, self-image, physical symptoms, and general health in studies assessing change in quality of life.  PHQ-9: Recent Review Flowsheet Data    Depression screen [facility] 2/9 08/23/2020 03/05/2020 04/12/2019 07/28/2018 03/15/2018   Decreased Interest 0 0 0 1 0   Down, Depressed, Hopeless 0 0 0 0 0   PHQ - 2 Score 0 0 0 1 0   Altered sleeping 3 - - 3 3   Tired, decreased energy 2 - - 3 3   Change in appetite 0 - - 1 1   Feeling bad or failure about yourself  0 - - 0 0   Trouble concentrating 0 - - 0 0   Moving slowly or fidgety/restless 0 - - 0 0   Suicidal thoughts 0 - - 0 0   PHQ-9 Score 5 - - 8 7   Difficult doing work/chores Not difficult at all - - Not difficult at all Not difficult at all     Interpretation of Total Score  Total Score Depression Severity:  1-4 = Minimal depression, 5-9 = Mild depression, 10-14 = Moderate depression, 15-19 = Moderately severe depression, 20-27 = Severe depression   Psychosocial Evaluation and Intervention:   Psychosocial Re-Evaluation:  Psychosocial Re-Evaluation    Row Name 08/27/20 1627 09/25/20 1355 10/16/20 1727         Psychosocial Re-Evaluation   Current issues with Current Depression Current Depression Current Depression     Comments Reviewed quality of life scores with the patient. Kalief has been through a lot having recent knee surgery in  August and OHS in October Geovanni has not voiced any increased concerns or stressors since participating in the program. Tymere has not voiced any increased concerns or stressors since participating in the program.     Expected Outcomes Patient will have decreased depression upon completion of phase 2 cardiac rehab. Patient will have decreased depression upon completion of phase 2 cardiac rehab. Patient will have decreased depression upon completion of phase 2 cardiac rehab.     Interventions Encouraged to attend Cardiac Rehabilitation for the exercise Encouraged to attend Cardiac Rehabilitation for the exercise Encouraged to attend Cardiac Rehabilitation for the exercise     Continue Psychosocial Services  No Follow up required No Follow up required No Follow up required            Psychosocial Discharge (Final Psychosocial Re-Evaluation):  Psychosocial Re-Evaluation - 10/16/20 1727      Psychosocial Re-Evaluation   Current issues with Current Depression    Comments Erica has not voiced any increased concerns or stressors since participating in the program.    Expected Outcomes Patient will have decreased depression upon completion of phase 2 cardiac rehab.    Interventions Encouraged to attend Cardiac Rehabilitation for the exercise    Continue Psychosocial Services  No Follow up required           Vocational Rehabilitation: Provide vocational rehab assistance to qualifying candidates.   Vocational  Rehab Evaluation & Intervention:  Vocational Rehab - 08/24/20 0940      Initial Vocational Rehab Evaluation & Intervention   Assessment shows need for Vocational Rehabilitation No   Dixie is retired and does not need vocational rehab at this time          Education: Education Goals: Education classes will be provided on a weekly basis, covering required topics. Participant will state understanding/return demonstration of topics presented.  Learning Barriers/Preferences:  Learning  Barriers/Preferences - 08/23/20 1555      Learning Barriers/Preferences   Learning Barriers Sight;Hearing   wears glasses and hearing aides   Learning Preferences Written Material;Video;Verbal Instruction;Audio;Computer/Internet;Group Instruction;Individual Instruction;Pictoral;Skilled Demonstration           Education Topics: Hypertension, Hypertension Reduction -Define heart disease and high blood pressure. Discus how high blood pressure affects the body and ways to reduce high blood pressure.   Exercise and Your Heart -Discuss why it is important to exercise, the FITT principles of exercise, normal and abnormal responses to exercise, and how to exercise safely.   Angina -Discuss definition of angina, causes of angina, treatment of angina, and how to decrease risk of having angina.   Cardiac Medications -Review what the following cardiac medications are used for, how they affect the body, and side effects that may occur when taking the medications.  Medications include Aspirin, Beta blockers, calcium channel blockers, ACE Inhibitors, angiotensin receptor blockers, diuretics, digoxin, and antihyperlipidemics.   Congestive Heart Failure -Discuss the definition of CHF, how to live with CHF, the signs and symptoms of CHF, and how keep track of weight and sodium intake.   Heart Disease and Intimacy -Discus the effect sexual activity has on the heart, how changes occur during intimacy as we age, and safety during sexual activity.   Smoking Cessation / COPD -Discuss different methods to quit smoking, the health benefits of quitting smoking, and the definition of COPD.   Nutrition I: Fats -Discuss the types of cholesterol, what cholesterol does to the heart, and how cholesterol levels can be controlled.   Nutrition II: Labels -Discuss the different components of food labels and how to read food label   Heart Parts/Heart Disease and PAD -Discuss the anatomy of the heart, the  pathway of blood circulation through the heart, and these are affected by heart disease.   Stress I: Signs and Symptoms -Discuss the causes of stress, how stress may lead to anxiety and depression, and ways to limit stress.   Stress II: Relaxation -Discuss different types of relaxation techniques to limit stress.   Warning Signs of Stroke / TIA -Discuss definition of a stroke, what the signs and symptoms are of a stroke, and how to identify when someone is having stroke.   Knowledge Questionnaire Score:  Knowledge Questionnaire Score - 08/23/20 1557      Knowledge Questionnaire Score   Pre Score 21/24           Core Components/Risk Factors/Patient Goals at Admission:  Personal Goals and Risk Factors at Admission - 08/24/20 0941      Core Components/Risk Factors/Patient Goals on Admission    Weight Management Yes;Obesity;Weight Loss    Intervention Weight Management: Develop a combined nutrition and exercise program designed to reach desired caloric intake, while maintaining appropriate intake of nutrient and fiber, sodium and fats, and appropriate energy expenditure required for the weight goal.;Weight Management: Provide education and appropriate resources to help participant work on and attain dietary goals.;Weight Management/Obesity: Establish reasonable short term and long  term weight goals.;Obesity: Provide education and appropriate resources to help participant work on and attain dietary goals.    Admit Weight 227 lb 1.2 oz (103 kg)    Expected Outcomes Short Term: Continue to assess and modify interventions until short term weight is achieved;Long Term: Adherence to nutrition and physical activity/exercise program aimed toward attainment of established weight goal;Weight Maintenance: Understanding of the daily nutrition guidelines, which includes 25-35% calories from fat, 7% or less cal from saturated fats, less than 200mg  cholesterol, less than 1.5gm of sodium, & 5 or more  servings of fruits and vegetables daily;Weight Loss: Understanding of general recommendations for a balanced deficit meal plan, which promotes 1-2 lb weight loss per week and includes a negative energy balance of [id number] kcal/d;Understanding recommendations for meals to include 15-35% energy as protein, 25-35% energy from fat, 35-60% energy from carbohydrates, less than 200mg  of dietary cholesterol, 20-35 gm of total fiber daily;Understanding of distribution of calorie intake throughout the day with the consumption of 4-5 meals/snacks    Heart Failure Yes    Intervention Provide a combined exercise and nutrition program that is supplemented with education, support and counseling about heart failure. Directed toward relieving symptoms such as shortness of breath, decreased exercise tolerance, and extremity edema.    Expected Outcomes Short term: Attendance in program 2-3 days a week with increased exercise capacity. Reported lower sodium intake. Reported increased fruit and vegetable intake. Reports medication compliance.;Short term: Daily weights obtained and reported for increase. Utilizing diuretic protocols set by physician.;Long term: Adoption of self-care skills and reduction of barriers for early signs and symptoms recognition and intervention leading to self-care maintenance.    Hypertension Yes    Intervention Provide education on lifestyle modifcations including regular physical activity/exercise, weight management, moderate sodium restriction and increased consumption of fresh fruit, vegetables, and low fat dairy, alcohol moderation, and smoking cessation.;Monitor prescription use compliance.    Expected Outcomes Short Term: Continued assessment and intervention until BP is < 140/80mm HG in hypertensive participants. < 130/27mm HG in hypertensive participants with diabetes, heart failure or chronic kidney disease.;Long Term: Maintenance of blood pressure at goal levels.    Lipids Yes     Intervention Provide education and support for participant on nutrition & aerobic/resistive exercise along with prescribed medications to achieve LDL 70mg , HDL >40mg .    Expected Outcomes Short Term: Participant states understanding of desired cholesterol values and is compliant with medications prescribed. Participant is following exercise prescription and nutrition guidelines.;Long Term: Cholesterol controlled with medications as prescribed, with individualized exercise RX and with personalized nutrition plan. Value goals: LDL < 70mg , HDL > 40 mg.           Core Components/Risk Factors/Patient Goals Review:   Goals and Risk Factor Review    Row Name 08/27/20 1628 08/27/20 1629 09/25/20 1356 10/16/20 1728       Core Components/Risk Factors/Patient Goals Review   Personal Goals Review - Weight Management/Obesity;Heart Failure;Hypertension;Lipids Weight Management/Obesity;Heart Failure;Hypertension;Lipids Weight Management/Obesity;Heart Failure;Hypertension;Lipids    Review Inocente did well on his first day of exercise. VSS. Patient did report some mild knee pain Langston did well on his first day of exercise. VSS. Patient did report some mild knee pain will continue to monitor Anothony has been doing well with exercise at cardiac rehab. Jesse's vital sings have been stable. Lem says the his knee feels better Caillou has been doing well with exercise at cardiac rehab. Dejion's vital sings have been stable. Leeum says the his knee feels better. Marcello Moores  will complete cardiac rehab on 10/19/20    Expected Outcomes - Hercules will continue to participater in phase 2 cardiac rehab for exercise, nutrtition and lifestyle modifications Melchor will continue to participater in phase 2 cardiac rehab for exercise, nutrtition and lifestyle modifications Madex will continue to participater in phase 2 cardiac rehab for exercise, nutrtition and lifestyle modifications           Core Components/Risk Factors/Patient  Goals at Discharge (Final Review):   Goals and Risk Factor Review - 10/16/20 1728      Core Components/Risk Factors/Patient Goals Review   Personal Goals Review Weight Management/Obesity;Heart Failure;Hypertension;Lipids    Review Jayse has been doing well with exercise at cardiac rehab. Lelon's vital sings have been stable. Chey says the his knee feels better. Terrell will complete cardiac rehab on 10/19/20    Expected Outcomes Roald will continue to participater in phase 2 cardiac rehab for exercise, nutrtition and lifestyle modifications           ITP Comments:  ITP Comments    Row Name 08/24/20 0931 08/27/20 1626 09/25/20 1353 10/16/20 1727     ITP Comments Dr Fransico Him MD, Medical Director 30 Day ITP Reivew. Ayodele did well on his first day of exercise on 08/27/20. 30 Day ITP Reivew. Clive has good attendance and participaiton in phase 2 cardiac rehab. Delois reports feeling stronger since he has been participating in the program 30 Day ITP Reivew. Linken has good attendance and participaiton in phase 2 cardiac rehab. Hamlin will complete cardiac rehab on 10/19/20           Comments: See ITP comments. Koal will complete cardiac rehab on 10/19/20.Barnet Pall, RN,BSN 10/16/2020 5:31 PM

## 2020-10-17 ENCOUNTER — Encounter (HOSPITAL_COMMUNITY)
Admission: RE | Admit: 2020-10-17 | Discharge: 2020-10-17 | Disposition: A | Discharge status: Home / Self Care | Payer: No Typology Code available for payment source | Source: Ambulatory Visit | Attending: Internal Medicine | Admitting: Internal Medicine

## 2020-10-17 ENCOUNTER — Other Ambulatory Visit: Payer: Self-pay

## 2020-10-17 DIAGNOSIS — Z951 Presence of aortocoronary bypass graft: Secondary | ICD-10-CM | POA: Diagnosis present

## 2020-10-17 DIAGNOSIS — I214 Non-ST elevation (NSTEMI) myocardial infarction: Secondary | ICD-10-CM

## 2020-10-18 ENCOUNTER — Ambulatory Visit (HOSPITAL_COMMUNITY)
Admission: RE | Admit: 2020-10-18 | Discharge: 2020-10-18 | Disposition: A | Discharge status: Home / Self Care | Payer: No Typology Code available for payment source | Source: Ambulatory Visit | Attending: Internal Medicine | Admitting: Internal Medicine

## 2020-10-18 ENCOUNTER — Encounter (HOSPITAL_COMMUNITY): Payer: Self-pay | Admitting: Internal Medicine

## 2020-10-18 VITALS — BP 124/70 | HR 70 | Wt 228.8 lb

## 2020-10-18 DIAGNOSIS — I080 Rheumatic disorders of both mitral and aortic valves: Secondary | ICD-10-CM | POA: Diagnosis not present

## 2020-10-18 DIAGNOSIS — I110 Hypertensive heart disease with heart failure: Secondary | ICD-10-CM | POA: Insufficient documentation

## 2020-10-18 DIAGNOSIS — Z7901 Long term (current) use of anticoagulants: Secondary | ICD-10-CM | POA: Diagnosis not present

## 2020-10-18 DIAGNOSIS — J449 Chronic obstructive pulmonary disease, unspecified: Secondary | ICD-10-CM | POA: Diagnosis not present

## 2020-10-18 DIAGNOSIS — Z6835 Body mass index (BMI) 35.0-35.9, adult: Secondary | ICD-10-CM | POA: Diagnosis not present

## 2020-10-18 DIAGNOSIS — I5022 Chronic systolic (congestive) heart failure: Secondary | ICD-10-CM

## 2020-10-18 DIAGNOSIS — I252 Old myocardial infarction: Secondary | ICD-10-CM | POA: Insufficient documentation

## 2020-10-18 DIAGNOSIS — Z7982 Long term (current) use of aspirin: Secondary | ICD-10-CM | POA: Insufficient documentation

## 2020-10-18 DIAGNOSIS — Z79899 Other long term (current) drug therapy: Secondary | ICD-10-CM | POA: Insufficient documentation

## 2020-10-18 DIAGNOSIS — Z87891 Personal history of nicotine dependence: Secondary | ICD-10-CM | POA: Insufficient documentation

## 2020-10-18 DIAGNOSIS — N5201 Erectile dysfunction due to arterial insufficiency: Secondary | ICD-10-CM

## 2020-10-18 DIAGNOSIS — N529 Male erectile dysfunction, unspecified: Secondary | ICD-10-CM | POA: Insufficient documentation

## 2020-10-18 DIAGNOSIS — I255 Ischemic cardiomyopathy: Secondary | ICD-10-CM | POA: Insufficient documentation

## 2020-10-18 DIAGNOSIS — Z596 Low income: Secondary | ICD-10-CM | POA: Diagnosis not present

## 2020-10-18 DIAGNOSIS — I2510 Atherosclerotic heart disease of native coronary artery without angina pectoris: Secondary | ICD-10-CM | POA: Insufficient documentation

## 2020-10-18 DIAGNOSIS — E669 Obesity, unspecified: Secondary | ICD-10-CM | POA: Diagnosis not present

## 2020-10-18 DIAGNOSIS — Z951 Presence of aortocoronary bypass graft: Secondary | ICD-10-CM | POA: Insufficient documentation

## 2020-10-18 DIAGNOSIS — Z8546 Personal history of malignant neoplasm of prostate: Secondary | ICD-10-CM | POA: Diagnosis not present

## 2020-10-18 DIAGNOSIS — I480 Paroxysmal atrial fibrillation: Secondary | ICD-10-CM | POA: Diagnosis not present

## 2020-10-18 HISTORY — DX: Heart failure, unspecified: I50.9

## 2020-10-18 LAB — BASIC METABOLIC PANEL
Anion gap: 12 (ref 5–15)
BUN: 23 mg/dL (ref 8–23)
CO2: 20 mmol/L — ABNORMAL LOW (ref 22–32)
Calcium: 9.1 mg/dL (ref 8.9–10.3)
Chloride: 104 mmol/L (ref 98–111)
Creatinine, Ser: 0.99 mg/dL (ref 0.61–1.24)
GFR, Estimated: >60 mL/min (ref >60)
Glucose, Bld: 87 mg/dL (ref 70–99)
Potassium: 4.1 mmol/L (ref 3.5–5.1)
Sodium: 136 mmol/L (ref 135–145)

## 2020-10-18 LAB — BRAIN NATRIURETIC PEPTIDE: B Natriuretic Peptide: 110.1 pg/mL — ABNORMAL HIGH (ref 0.0–100.0)

## 2020-10-18 MED ORDER — BISOPROLOL FUMARATE 5 MG PO TABS
2.5000 mg | ORAL_TABLET | Freq: Every day | ORAL | 6 refills | Status: DC
Start: 2020-10-18 — End: 2020-12-04

## 2020-10-18 MED ORDER — SILDENAFIL CITRATE 100 MG PO TABS
50.0000 mg | ORAL_TABLET | Freq: Every day | ORAL | 1 refills | Status: DC | PRN
Start: 2020-10-18 — End: 2023-05-08

## 2020-10-18 MED ORDER — SILDENAFIL CITRATE 100 MG PO TABS: 50.0000 mg | ORAL_TABLET | Freq: Every day | ORAL | 1 refills | Status: DC | PRN

## 2020-10-18 NOTE — Progress Notes (Signed)
Advanced Heart Failure Clinic Note   Referring Physician: PCP: Leamon Arnt, MD PCP-Cardiologist: No primary care provider on file.   HPI [name] is a 75  year old with obesity, HTN, OSA on CPAP, HL, prostate CA, PAF, CAD s/p CABG 19/4/17 and systolic HF.   Had ECHO in 2020 that showed EF 60-65% with normal RV.   Presented to [facility] via EMS with chest pain and shortness of breath. Arrived in the ED was in A fib RVR and had ST depression. HS Trop [id number]. Cath showed severe multivessel disease with elevated filling pressures. Echo EF 25-30% with mild AS. IABP placed. Underwent CABG 05/18/20 with Maze and LAA clipping. LIMA -> LAD, RIMA  -> PDA. Left radial OM1-> OM2. Post CABG course was slow but relatively uneventful. Discharged home on 10/13. - ECHO repeated 10/10 and showed EF improved 40-45% normal RV.   Today he returns for HF follow up. Overall doing much better. About to finish CR tomorrow. Wants to [facility] at [facility]. No CP. Still with some SOB but not too bad. Can go to the store without problem No edema, orthopnea or PND. Compliant with meds. SBP 110-120    Studies:  RHC/LHC 05/17/20   Ost LM to Mid LM lesion is 99% stenosed.  Ost RCA to Prox RCA lesion is 99% stenosed.  Prox RCA to Mid RCA lesion is 80% stenosed. RA 17 PA 40/11 PCWP 28  CO 5 CI 2.2  Echo 05/17/20  1. There is akinesis of the mid-to-distal anterolateral, all  mid-to-distal septal, mid-to-distal anterior, and all apical segments. The  rest of the LV segments are hypokinetic. Findings are suggestive of  multivessel CAD.Marland Kitchen Left ventricular ejection  fraction, by estimation, is 25 to 30%. The left ventricle has severely  decreased function. The left ventricle demonstrates regional wall motion  abnormalities (see scoring diagram/findings for description). There is  moderate concentric left ventricular  hypertrophy. Left ventricular diastolic parameters are indeterminate.  2. Right  ventricular systolic function is normal. The right ventricular  size is normal. There is mildly elevated pulmonary artery systolic  pressure.  3. Left atrial size was moderately dilated.  4. The mitral valve is degenerative. Mild mitral valve regurgitation. No  evidence of mitral stenosis.  5. The aortic valve is tricuspid. There is moderate calcification of the  aortic valve. There is moderate thickening of the aortic valve. Aortic  valve regurgitation is not visualized. Mild aortic valve stenosis.  6. The inferior vena cava is dilated in size with >50% respiratory  variability, suggesting right atrial pressure of 8 mmHg.   Echo 10/21  EF 40-45%    Past Medical History:  Diagnosis Date  . Aortic stenosis, mild    noted on ECHO  . Asthma   . Benign essential hypertension, on Norvasc and Amlodipine 12/21/2008   Followed by [facility]   . BPH (benign prostatic hyperplasia) 09/03/2012  . Carotid bruit present 06/15/2018   2015 Carotid Dopplers:  Essentially normal carotid arteries, with very slight hard plaque in the proximal ICA's and serpentine distal vessels. 1-39% bilateral ICA stenosis. Patent vertebral arteries with antegrade flow. Normal subclavian arteries, bilaterally.  . CHF (congestive heart failure) ([place])   . Colonic polyp 01/09/2020  . COPD (chronic obstructive pulmonary disease) ([place])   . Coronary artery disease   . Dyslipidemia 12/21/2008   Qualifier: Diagnosis of  By: Percival Spanish, MD, Farrel Gordon    . Essential hypertension, benign 12/21/2008   Qualifier: Diagnosis of  By: Percival Spanish,  MD, Farrel Gordon    . Former smoker, stopped smoking in distant past 03/15/2018  . Hematuria 09/16/2011  . History of migraine   . Hyperlipidemia   . OA (osteoarthritis)   . Obese   . OSA on CPAP 09/25/2015   Diagnosed at the [facility] 2016   . Prostate cancer ([place]) 11/30/2019  . PVC's (premature ventricular contractions)   . Seasonal allergies   . Sinus bradycardia   . Squamous  cell carcinoma of skin   . TIA (transient ischemic attack)    questionable  . Vitamin B 12 deficiency   . Vitamin D deficiency 11/26/2010    Current Outpatient Medications  Medication Sig Dispense Refill  . alfuzosin (UROXATRAL) 10 MG 24 hr tablet Take 10 mg by mouth daily with breakfast.    . amiodarone (PACERONE) 200 MG tablet Take 200 mg by mouth daily.    Marland Kitchen apixaban (ELIQUIS) 5 MG TABS tablet Take 1 tablet (5 mg total) by mouth 2 (two) times daily. 180 tablet 3  . aspirin 81 MG EC tablet Take 1 tablet (81 mg total) by mouth daily. Swallow whole. 90 tablet 3  . carboxymethylcellulose (REFRESH PLUS) 0.5 % SOLN Place 1 drop into both eyes 2 (two) times daily as needed (dry eyes).    . cetirizine (ZYRTEC) 10 MG tablet Take 10 mg by mouth daily.    . Cholecalciferol (VITAMIN D3) 2000 UNITS TABS Take 2,000 Int'l Units by mouth daily.     . empagliflozin (JARDIANCE) 10 MG TABS tablet Take 1 tablet (10 mg total) by mouth daily. 90 tablet 3  . finasteride (PROSCAR) 5 MG tablet Take 5 mg by mouth daily.    . fluticasone (FLONASE) 50 MCG/ACT nasal spray USE TWO SPRAYS IN EACH NOSTRIL DAILY AS NEEDED. 48 mL 0  . furosemide (LASIX) 80 MG tablet Take 0.5 tablets (40 mg total) by mouth daily as needed. 45 tablet 3  . latanoprost (XALATAN) 0.005 % ophthalmic solution INSTILL 1 DROP INTO BOTH EYES AT BEDTIME 11.25 mL 1  . montelukast (SINGULAIR) 10 MG tablet TAKE 1 TABLET BY MOUTH EVERY DAY AT BEDTIME 90 tablet 3  . NON FORMULARY Cpap at night    . rosuvastatin (CRESTOR) 20 MG tablet Take 1 tablet (20 mg total) by mouth daily. 90 tablet 3  . sacubitril-valsartan (ENTRESTO) 24-26 MG Take 1 tablet by mouth 2 (two) times daily. 180 tablet 3  . spironolactone (ALDACTONE) 25 MG tablet TAKE 1 TABLET BY MOUTH EVERY DAY 90 tablet 1   No current facility-administered medications for this encounter.    Allergies  Allergen Reactions  . Tamsulosin Other (See Comments)    dizziness  . Cipro [Ciprofloxacin  Hcl] Swelling    Knee swelling, joint pain  . Lipitor [Atorvastatin] Other (See Comments)    Leg Cramps  . Lisinopril Cough      Social History   Socioeconomic History  . Marital status: Married    Spouse name: Not on file  . Number of children: Not on file  . Years of education: 18  . Highest education level: Not on file  Occupational History  . Occupation: retired  Tobacco Use  . Smoking status: Former Smoker    Types: Cigarettes    Quit date: 08/18/1965    Years since quitting: 55.2  . Smokeless tobacco: Never Used  . Tobacco comment: pt quit smoking 53 yrs ago  Vaping Use  . Vaping Use: Never used  Substance and Sexual Activity  .  Alcohol use: Yes    Alcohol/week: 2.0 - 6.0 standard drinks    Types: 2 - 6 Cans of beer per week  . Drug use: No  . Sexual activity: Yes  Other Topics Concern  . Not on file  Social History Narrative   Is a [place] veteran, has trouble sleeping most nights. Often experiences nightmares w/anesthesia meds.   Social Determinants of Health   Financial Resource Strain: Low Risk   . Difficulty of Paying Living Expenses: Not hard at all  Food Insecurity: No Food Insecurity  . Worried About Charity fundraiser in the Last Year: Never true  . Ran Out of Food in the Last Year: Never true  Transportation Needs: No Transportation Needs  . Lack of Transportation (Medical): No  . Lack of Transportation (Non-Medical): No  Physical Activity: Unknown  . Days of Exercise per Week: 7 days  . Minutes of Exercise per Session: Not on file  Stress: No Stress Concern Present  . Feeling of Stress : Not at all  Social Connections: Moderately Isolated  . Frequency of Communication with Friends and Family: More than three times a week  . Frequency of Social Gatherings with Friends and Family: More than three times a week  . Attends Religious Services: Never  . Active Member of Clubs or Organizations: No  . Attends Archivist Meetings: Never  .  Marital Status: Married  Human resources officer Violence: Not At Risk  . Fear of Current or Ex-Partner: No  . Emotionally Abused: No  . Physically Abused: No  . Sexually Abused: No      Family History  Problem Relation Age of Onset  . Diabetes Mother   . COPD Father   . Stroke Paternal Grandfather     Vitals:   10/18/20 1347  BP: 124/70  Pulse: 70  SpO2: 97%  Weight: 103.8 kg (228 lb 12.8 oz)   Wt Readings from Last 3 Encounters:  10/18/20 103.8 kg (228 lb 12.8 oz)  10/15/20 101.9 kg (224 lb 10.4 oz)  09/07/20 103 kg (227 lb)      PHYSICAL EXAM: General:  Well appearing. No resp difficulty HEENT: normal Neck: supple. no JVD. Carotids 2+ bilat; no bruits. No lymphadenopathy or thryomegaly appreciated. Cor: PMI nondisplaced. Regular rate & rhythm. 2/6 SEM LSB  Lungs: clear Abdomen: soft, nontender, nondistended. No hepatosplenomegaly. No bruits or masses. Good bowel sounds. Extremities: no cyanosis, clubbing, rash, edema Neuro: alert & orientedx3, cranial nerves grossly intact. moves all 4 extremities w/o difficulty. Affect pleasant   ASSESSMENT & PLAN:  1. CAD/NSTEMIwith critcal LM disease - LHC 9/21  with severe multivessel disease.  - s/p CABG 05/18/20 with Maze and LAA clipping. LIMA -> LAD, RIMA  -> PDA. Left radial OM1-> OM2 - No s/s angina - continue ASA/Crestor 20 (myalgias with atorvastatin).  - About to complete CR. Will clear him for [facility] exercise  2. Chronic Systolic Heart Failure due to ischemic CM - ECHO in 2020 showed EF 60-65%. - ECHO 04/2124-30%. RV normal. Mild AS   - ECHO 05/27/2020 EF improved 40-45% normal RV.  - NYHA II. Volume stable ok off lasix - Start bisoprolol 2.5 daily - Continue Spiro 25 - Continue Entresto 24-26 bid - Consider Jardiance 10 daily  - Check labs today  3. PAF  - s/p MAZE w/ LAA Clip  - In NSR  - Can stop amio  - continue Eliquis  5 bid. No bleeding  4. H/o Prostate Cancer  -  continue alfuzosin  5.  Erectile dysfunction - start sildenafil   Glori Bickers, MD 10/18/20

## 2020-10-18 NOTE — Patient Instructions (Addendum)
Stop Amiodarone  Start Bisoprolol 2.5 mg (1/2 tab) Daily  Take Sildenafil 50 mg (1/2 tab) AS NEEDED  Labs done today, your results will be available in MyChart, we will contact you for abnormal readings.  Please call our office in August 2022 to schedule your follow up appointment and echocardiogram  If you have any questions or concerns before your next appointment please send [place] a message through [place] or call our office at [phone].    TO LEAVE A MESSAGE FOR THE NURSE SELECT OPTION 2, PLEASE LEAVE A MESSAGE INCLUDING: . YOUR NAME . DATE OF BIRTH . CALL BACK NUMBER . REASON FOR CALL**this is important as we prioritize the call backs  [place] AS LONG AS YOU CALL BEFORE 4:00 PM  At the [facility], you and your health needs are our priority. As part of our continuing mission to provide you with exceptional heart care, we have created designated Provider Care Teams. These Care Teams include your primary Cardiologist (physician) and Advanced Practice Providers (APPs- Physician Assistants and Nurse Practitioners) who all work together to provide you with the care you need, when you need it.   You may see any of the following providers on your designated Care Team at your next follow up: Marland Kitchen Dr Glori Bickers . Dr Loralie Champagne . Dr Vickki Muff . Darrick Grinder, NP . Lyda Jester, [place] . Audry Riles, PharmD   Please be sure to bring in all your medications bottles to every appointment.

## 2020-10-19 ENCOUNTER — Encounter (HOSPITAL_COMMUNITY)
Admission: RE | Admit: 2020-10-19 | Discharge: 2020-10-19 | Disposition: A | Discharge status: Home / Self Care | Payer: No Typology Code available for payment source | Source: Ambulatory Visit | Attending: Internal Medicine | Admitting: Internal Medicine

## 2020-10-19 ENCOUNTER — Other Ambulatory Visit: Payer: Self-pay

## 2020-10-19 DIAGNOSIS — Z951 Presence of aortocoronary bypass graft: Secondary | ICD-10-CM

## 2020-10-19 DIAGNOSIS — I214 Non-ST elevation (NSTEMI) myocardial infarction: Secondary | ICD-10-CM

## 2020-10-19 NOTE — Progress Notes (Signed)
Discharge Progress Report  Patient Details  Name: Eric Stokes MRN: [id number] Date of Birth: 1945/09/03 Referring Provider:   Flowsheet Row CARDIAC REHAB PHASE II ORIENTATION from 08/23/2020 in [place]  Referring Provider Dr Kristopher Oppenheim PA [facility]/ Dr Fransico Him MD, Covering       Number of Visits: 23  Reason for Discharge:  Patient reached a stable level of exercise. Patient independent in their exercise. Patient has met program and personal goals.  Smoking History:  Social History   Tobacco Use  Smoking Status Former Smoker  . Types: Cigarettes  . Quit date: 08/18/1965  . Years since quitting: 55.2  Smokeless Tobacco Never Used  Tobacco Comment   pt quit smoking 53 yrs ago    Diagnosis:  NSTEMI (non-ST elevated myocardial infarction) ([place]) 05/16/20  S/P CABG x 4, Maze Procedure 05/18/2020  ADL UCSD:   Initial Exercise Prescription:  Initial Exercise Prescription - 08/23/20 1500      Date of Initial Exercise RX and Referring Provider   Date 08/23/20    Referring Provider Dr Kristopher Oppenheim [facility]/ Dr Fransico Him MD, Covering    Expected Discharge Date 10/19/20      NuStep   Level 1    SPM 85    Minutes 30    METs 1.6      Prescription Details   Frequency (times per week) 3    Duration Progress to 30 minutes of continuous aerobic without signs/symptoms of physical distress      Intensity   THRR 40-80% of Max Heartrate 58-116    Ratings of Perceived Exertion 11-13    Perceived Dyspnea 0-4      Progression   Progression Continue progressive overload as per policy without signs/symptoms or physical distress.      Resistance Training   Training Prescription Yes    Weight 3    Reps 10-15           Discharge Exercise Prescription (Final Exercise Prescription Changes):  Exercise Prescription Changes - 10/19/20 1500      Response to Exercise   Blood Pressure (Admit) 116/66    Blood  Pressure (Exercise) 118/78    Blood Pressure (Exit) 120/60    Heart Rate (Admit) 80 bpm    Heart Rate (Exercise) 78 bpm    Heart Rate (Exit) 74 bpm    Rating of Perceived Exertion (Exercise) 13    Symptoms None    Comments Pt graduated frrom the CRP2 program today    Duration Continue with 30 min of aerobic exercise without signs/symptoms of physical distress.    Intensity THRR unchanged      Progression   Progression Continue to progress workloads to maintain intensity without signs/symptoms of physical distress.    Average METs 2      Resistance Training   Training Prescription Yes    Weight 3 lbs    Reps 10-15    Time 10 Minutes      Interval Training   Interval Training No      NuStep   Level 3    SPM 85    Minutes 30    METs 2      Home Exercise Plan   Plans to continue exercise at [facility] (comment)    Frequency Add 2 additional days to program exercise sessions.    Initial Home Exercises Provided 09/12/20           Functional Capacity:  6  Minute Walk    Row Name 08/23/20 1449 10/15/20 1428       6 Minute Walk   Phase -- Discharge    Distance 812 feet 950 feet    Distance % Change -- 17 %    Distance Feet Change -- 138 ft    Walk Time 6 minutes 6 minutes    # of Rest Breaks 0 0    MPH 1.54 1.8    METS 1.12 1.44    RPE 11 13    Perceived Dyspnea  1 0    VO2 Peak 3.91 5.03    Symptoms Yes (comment) Yes (comment)    Comments Knee pain 3/10, SOB RPD 1 Knee pain 3/10    Resting HR 76 bpm 71 bpm    Resting BP 118/56 112/62    Resting Oxygen Saturation  98 % 96 %    Exercise Oxygen Saturation  during 6 min walk 97 % 98 %    Max Ex. HR 83 bpm 85 bpm    Max Ex. BP 124/70 130/74    2 Minute Post BP 120/68 --           Psychological, QOL, Others - Outcomes: PHQ 2/9: Depression screen [facility] 2/9 10/19/2020 10/17/2020 08/23/2020 03/05/2020 04/12/2019  Decreased Interest 0 0 0 0 0  Down, Depressed, Hopeless 0 1 0 0 0  PHQ - 2 Score 0 1 0 0 0  Altered  sleeping - - 3 - -  Tired, decreased energy - - 2 - -  Change in appetite - - 0 - -  Feeling bad or failure about yourself  - - 0 - -  Trouble concentrating - - 0 - -  Moving slowly or fidgety/restless - - 0 - -  Suicidal thoughts - - 0 - -  PHQ-9 Score - - 5 - -  Difficult doing work/chores - - Not difficult at all - -    Quality of Life:  Quality of Life - 10/19/20 1221      Quality of Life Scores   Health/Function Post 15.47 %    Socioeconomic Post 22.5 %    Psych/Spiritual Post 22.08 %    Family Post 22.3 %    GLOBAL Post 19.09 %           Personal Goals: Goals established at orientation with interventions provided to work toward goal.  Personal Goals and Risk Factors at Admission - 08/24/20 0941      Core Components/Risk Factors/Patient Goals on Admission    Weight Management Yes;Obesity;Weight Loss    Intervention Weight Management: Develop a combined nutrition and exercise program designed to reach desired caloric intake, while maintaining appropriate intake of nutrient and fiber, sodium and fats, and appropriate energy expenditure required for the weight goal.;Weight Management: Provide education and appropriate resources to help participant work on and attain dietary goals.;Weight Management/Obesity: Establish reasonable short term and long term weight goals.;Obesity: Provide education and appropriate resources to help participant work on and attain dietary goals.    Admit Weight 227 lb 1.2 oz (103 kg)    Expected Outcomes Short Term: Continue to assess and modify interventions until short term weight is achieved;Long Term: Adherence to nutrition and physical activity/exercise program aimed toward attainment of established weight goal;Weight Maintenance: Understanding of the daily nutrition guidelines, which includes 25-35% calories from fat, 7% or less cal from saturated fats, less than 239m cholesterol, less than 1.5gm of sodium, & 5 or more servings of fruits and  vegetables daily;Weight Loss: Understanding of general recommendations for a balanced deficit meal plan, which promotes 1-2 lb weight loss per week and includes a negative energy balance of [phone] kcal/d;Understanding recommendations for meals to include 15-35% energy as protein, 25-35% energy from fat, 35-60% energy from carbohydrates, less than 275m of dietary cholesterol, 20-35 gm of total fiber daily;Understanding of distribution of calorie intake throughout the day with the consumption of 4-5 meals/snacks    Heart Failure Yes    Intervention Provide a combined exercise and nutrition program that is supplemented with education, support and counseling about heart failure. Directed toward relieving symptoms such as shortness of breath, decreased exercise tolerance, and extremity edema.    Expected Outcomes Short term: Attendance in program 2-3 days a week with increased exercise capacity. Reported lower sodium intake. Reported increased fruit and vegetable intake. Reports medication compliance.;Short term: Daily weights obtained and reported for increase. Utilizing diuretic protocols set by physician.;Long term: Adoption of self-care skills and reduction of barriers for early signs and symptoms recognition and intervention leading to self-care maintenance.    Hypertension Yes    Intervention Provide education on lifestyle modifcations including regular physical activity/exercise, weight management, moderate sodium restriction and increased consumption of fresh fruit, vegetables, and low fat dairy, alcohol moderation, and smoking cessation.;Monitor prescription use compliance.    Expected Outcomes Short Term: Continued assessment and intervention until BP is < 140/917mHG in hypertensive participants. < 130/8080mG in hypertensive participants with diabetes, heart failure or chronic kidney disease.;Long Term: Maintenance of blood pressure at goal levels.    Lipids Yes    Intervention Provide education  and support for participant on nutrition & aerobic/resistive exercise along with prescribed medications to achieve LDL <40m74mDL >40mg72m Expected Outcomes Short Term: Participant states understanding of desired cholesterol values and is compliant with medications prescribed. Participant is following exercise prescription and nutrition guidelines.;Long Term: Cholesterol controlled with medications as prescribed, with individualized exercise RX and with personalized nutrition plan. Value goals: LDL < 40mg,76m > 40 mg.            Personal Goals Discharge:  Goals and Risk Factor Review    Row Name 08/27/20 1628 08/27/20 1629 09/25/20 1356 10/16/20 1728       Core Components/Risk Factors/Patient Goals Review   Personal Goals Review -- Weight Management/Obesity;Heart Failure;Hypertension;Lipids Weight Management/Obesity;Heart Failure;Hypertension;Lipids Weight Management/Obesity;Heart Failure;Hypertension;Lipids    Review ThomasChrishunell on his first day of exercise. VSS. Patient did report some mild knee pain ThomasReedyell on his first day of exercise. VSS. Patient did report some mild knee pain will continue to monitor ThomasTayvieneen doing well with exercise at cardiac rehab. Jayton's vital sings have been stable. ThomasTicothe his knee feels better ThomasElijaneen doing well with exercise at cardiac rehab. Park's vital sings have been stable. ThomasMychaelthe his knee feels better. ThomasGriffcomplete cardiac rehab on 10/19/20    Expected Outcomes -- ThomasGracianocontinue to participater in phase 2 cardiac rehab for exercise, nutrtition and lifestyle modifications ThomasMiqueascontinue to participater in phase 2 cardiac rehab for exercise, nutrtition and lifestyle modifications ThomasTeraldcontinue to participater in phase 2 cardiac rehab for exercise, nutrtition and lifestyle modifications           Exercise Goals and Review:  Exercise Goals    Row Name 08/23/20 1548              Exercise Goals   Increase  Physical Activity Yes       Intervention Provide advice, education, support and counseling about physical activity/exercise needs.;Develop an individualized exercise prescription for aerobic and resistive training based on initial evaluation findings, risk stratification, comorbidities and participant's personal goals.       Expected Outcomes Short Term: Attend rehab on a regular basis to increase amount of physical activity.;Long Term: Add in home exercise to make exercise part of routine and to increase amount of physical activity.;Long Term: Exercising regularly at least 3-5 days a week.       Increase Strength and Stamina Yes       Able to understand and use rate of perceived exertion (RPE) scale Yes       Intervention Provide education and explanation on how to use RPE scale       Expected Outcomes Short Term: Able to use RPE daily in rehab to express subjective intensity level;Long Term:  Able to use RPE to guide intensity level when exercising independently       Able to understand and use Dyspnea scale Yes       Intervention Provide education and explanation on how to use Dyspnea scale       Expected Outcomes Short Term: Able to use Dyspnea scale daily in rehab to express subjective sense of shortness of breath during exertion;Long Term: Able to use Dyspnea scale to guide intensity level when exercising independently       Knowledge and understanding of Target Heart Rate Range (THRR) Yes       Intervention Provide education and explanation of THRR including how the numbers were predicted and where they are located for reference       Expected Outcomes Short Term: Able to state/look up THRR;Short Term: Able to use daily as guideline for intensity in rehab;Long Term: Able to use THRR to govern intensity when exercising independently       Understanding of Exercise Prescription Yes       Intervention Provide education, explanation, and written materials on patient's  individual exercise prescription       Expected Outcomes Short Term: Able to explain program exercise prescription;Long Term: Able to explain home exercise prescription to exercise independently              Exercise Goals Re-Evaluation:  Exercise Goals Re-Evaluation    Row Name 08/27/20 1451 09/12/20 1450 09/24/20 1033 09/24/20 1435 10/19/20 1500     Exercise Goal Re-Evaluation   Exercise Goals Review Increase Physical Activity;Increase Strength and Stamina;Able to understand and use rate of perceived exertion (RPE) scale;Knowledge and understanding of Target Heart Rate Range (THRR);Understanding of Exercise Prescription Increase Physical Activity;Increase Strength and Stamina;Able to understand and use rate of perceived exertion (RPE) scale;Knowledge and understanding of Target Heart Rate Range (THRR);Understanding of Exercise Prescription -- Increase Physical Activity;Increase Strength and Stamina;Able to understand and use rate of perceived exertion (RPE) scale;Knowledge and understanding of Target Heart Rate Range (THRR);Understanding of Exercise Prescription Increase Physical Activity;Increase Strength and Stamina;Able to understand and use rate of perceived exertion (RPE) scale;Knowledge and understanding of Target Heart Rate Range (THRR);Able to check pulse independently;Understanding of Exercise Prescription   Comments Pt's first day of execise in the CRP2 program, Pt understnads the RPE scale, THRR, and Exercise Rx. Reviewed METs and Home exercise Rx with patient. Pt will use his treadmill at home 2-3x/week for 30 minutes. Pt verbalized understanding of the home exercise Rx and was provided a copy. -- Reviewed Goals and METS. Pt voices that he  feels he is making progress toward his goals. He voices feeling stronger and has more stamina. Encouraging walking at home to supplement the CRP2 program to help increase CV fitness. Pt graduatred from the [facility] program today. Pt made some progress in the  program and voices feeling stronger. Pt had an average MET level of 2.0.  Pt has commited to continue his exercise and will be exercisng at the [employer].   Expected Outcomes Will continue to monitor patient and progress exercise workloads as tolerated. Pt will exercise at home on his own on his off days from the [place] program -- Will continue to monitor patient and progress workloads as tolerated. Pt will continue to exercise on his own at home and the gym.          Nutrition & Weight - Outcomes:  Pre Biometrics - 08/23/20 1550      Pre Biometrics   Height 5' 7.25" (1.708 m)    Weight 103 kg    Waist Circumference 47.5 inches    Hip Circumference 47 inches    Waist to Hip Ratio 1.01 %    BMI (Calculated) 35.31    Triceps Skinfold 14 mm    % Body Fat 34 %    Grip Strength 50 kg    Flexibility 15 in    Single Leg Stand --   Not done. Pt uses cane          Post Biometrics - 10/15/20 1445       Post  Biometrics   Height 5' 7.25" (1.708 m)    Weight 101.9 kg    Waist Circumference 46.5 inches    Hip Circumference 47 inches    Waist to Hip Ratio 0.99 %    BMI (Calculated) 34.93    Triceps Skinfold 13 mm    % Body Fat 33.1 %    Grip Strength 54 kg    Flexibility 16 in    Single Leg Stand --   Not done          Nutrition:  Nutrition Therapy & Goals - 09/07/20 1441      Nutrition Therapy   Diet TLC; low sodium    Drug/Food Interactions Statins/Certain Fruits      Personal Nutrition Goals   Nutrition Goal Pt to identify food quantities necessary to achieve weight loss of 6-24 lb at graduation from cardiac rehab.    Personal Goal #2 Pt to build a healthy plate including vegetables, fruits, whole grains, and low-fat dairy products in a heart healthy meal plan.    Personal Goal #3 Pt to continue reading labels and avoiding salt shaker to reduce sodium intake      Intervention Plan   Intervention Prescribe, educate and counsel regarding individualized specific dietary  modifications aiming towards targeted core components such as weight, hypertension, lipid management, diabetes, heart failure and other comorbidities.    Expected Outcomes Short Term Goal: Understand basic principles of dietary content, such as calories, fat, sodium, cholesterol and nutrients.           Nutrition Discharge:   Education Questionnaire Score:  Knowledge Questionnaire Score - 10/19/20 1222      Knowledge Questionnaire Score   Post Score 22/24           Goals reviewed with patient; copy given to patient. Pt graduated from cardiac rehab program today with completion of 36 exercise sessions in Phase II. Pt maintained good attendance and progressed nicely during his participation in rehab as  evidenced by increased MET level.   Medication list reconciled. Repeat  PHQ score-0  .  Pt has made significant lifestyle changes and should be commended for his success. Pt feels he has achieved his goals during cardiac rehab.   Pt plans to continue exercise at the [employer]. Tom reports feeling stronger since he as been participating in the program despite his chronic knee pain. Tom increased his distance by 138 feet. We are proud of Tom's progress!Barnet Pall, RN,BSN 10/23/2020 9:12 AM

## 2020-11-21 ENCOUNTER — Telehealth (HOSPITAL_COMMUNITY): Payer: Self-pay | Admitting: Cardiology

## 2020-11-21 NOTE — Telephone Encounter (Signed)
Since starting bisoprolol pt has multiple episodes of low b/p Reports a few times felt as if he could pass out Readings include: 75/39 80/40 115/70 104/65 111/62 88/60  Advised to hold until we return call with further medication adjustments

## 2020-11-26 NOTE — Telephone Encounter (Signed)
Pt called again about bp stating he had not heard back from our office. Chantel Jeffries,CMA spoke to patient 4/6 and routed the message to [employer].   Message routed to [employer] for advice

## 2020-12-03 NOTE — Telephone Encounter (Signed)
He can hold Entresto. Recommend that he come in for labs to check to see if dehydrated and to r/o anemia.   Needs BMP and CBC. Also arrange for RV visit to check BP w/ orthostatics. Also obtain EKG.

## 2020-12-03 NOTE — Telephone Encounter (Signed)
Left VM for pt to return call to schedule nurse visit.

## 2020-12-03 NOTE — Telephone Encounter (Signed)
Received a message from our clinic pharmacist Lauren "patient called and left me a VM Friday that his Bps were low and he feels like he is passing out"  I called pt on home number to get more information no answer/left vm requesting return call.

## 2020-12-04 NOTE — Telephone Encounter (Signed)
Spoke with pt and he said after stopping bisoprolol he felt much better. Pt has been keeping a bp log and bp is stable [phone]'[id number] systolic. Pt does not wish to make any other changes at this time and will contact our office if bp becomes elevated or he becomes symptomatic

## 2020-12-04 NOTE — Addendum Note (Signed)
Addended by: Harvie Junior on: 12/04/2020 12:16 PM   Modules accepted: Orders

## 2021-01-02 DIAGNOSIS — R310 Gross hematuria: Secondary | ICD-10-CM | POA: Diagnosis not present

## 2021-01-04 ENCOUNTER — Telehealth (HOSPITAL_COMMUNITY): Payer: Self-pay | Admitting: *Deleted

## 2021-01-04 DIAGNOSIS — R3914 Feeling of incomplete bladder emptying: Secondary | ICD-10-CM | POA: Diagnosis not present

## 2021-01-04 DIAGNOSIS — R310 Gross hematuria: Secondary | ICD-10-CM | POA: Diagnosis not present

## 2021-01-04 NOTE — Telephone Encounter (Signed)
Pt left VM stating he was having issues with his blood thinner and requested a call back. I called pt back no answer/left vm for return call.

## 2021-01-07 ENCOUNTER — Telehealth (HOSPITAL_COMMUNITY): Payer: Self-pay | Admitting: *Deleted

## 2021-01-07 NOTE — Telephone Encounter (Signed)
Pt left vm stating he noticed blood in his urine and was told by his urologist to stop blood thinner for a few days. Pt said after stopping blood thinner he didn't notice any blood in his urine. Pt restarted blood thinner and is experiencing blood in his urine again. Pt wants to know if he should stop blood thinner. Pt said urology advised him to contact our office.   Routed to [employer]

## 2021-01-08 NOTE — Telephone Encounter (Signed)
Defer to Dr. Haroldine Laws. He has been on Eliquis for AFib but had MAZE procedure w/ LAA clip. Has he had a lot of bleeding or just small amounts?

## 2021-01-08 NOTE — Telephone Encounter (Signed)
Received advice from [place] pt aware via original my chart message

## 2021-01-09 NOTE — Telephone Encounter (Signed)
Agree with plan 

## 2021-01-17 DIAGNOSIS — K5732 Diverticulitis of large intestine without perforation or abscess without bleeding: Secondary | ICD-10-CM | POA: Diagnosis not present

## 2021-01-17 DIAGNOSIS — N281 Cyst of kidney, acquired: Secondary | ICD-10-CM | POA: Diagnosis not present

## 2021-01-17 DIAGNOSIS — R310 Gross hematuria: Secondary | ICD-10-CM | POA: Diagnosis not present

## 2021-02-22 ENCOUNTER — Telehealth (HOSPITAL_COMMUNITY): Payer: Self-pay | Admitting: Vascular Surgery

## 2021-02-22 DIAGNOSIS — R310 Gross hematuria: Secondary | ICD-10-CM | POA: Diagnosis not present

## 2021-02-22 DIAGNOSIS — R3914 Feeling of incomplete bladder emptying: Secondary | ICD-10-CM | POA: Diagnosis not present

## 2021-02-22 NOTE — Telephone Encounter (Signed)
Returned pt call to make appt 

## 2021-02-25 ENCOUNTER — Ambulatory Visit (HOSPITAL_COMMUNITY)
Admission: RE | Admit: 2021-02-25 | Discharge: 2021-02-25 | Disposition: A | Discharge status: Home / Self Care | Payer: Medicare Other | Source: Ambulatory Visit | Attending: Internal Medicine | Admitting: Internal Medicine

## 2021-02-25 ENCOUNTER — Other Ambulatory Visit: Payer: Self-pay

## 2021-02-25 VITALS — BP 120/66 | HR 69 | Ht 67.25 in | Wt 222.8 lb

## 2021-02-25 DIAGNOSIS — I2510 Atherosclerotic heart disease of native coronary artery without angina pectoris: Secondary | ICD-10-CM

## 2021-02-25 DIAGNOSIS — I5022 Chronic systolic (congestive) heart failure: Secondary | ICD-10-CM | POA: Diagnosis not present

## 2021-02-25 DIAGNOSIS — I480 Paroxysmal atrial fibrillation: Secondary | ICD-10-CM | POA: Diagnosis not present

## 2021-02-25 LAB — BASIC METABOLIC PANEL
Anion gap: 6 (ref 5–15)
BUN: 23 mg/dL (ref 8–23)
CO2: 24 mmol/L (ref 22–32)
Calcium: 8.9 mg/dL (ref 8.9–10.3)
Chloride: 105 mmol/L (ref 98–111)
Creatinine, Ser: 1.26 mg/dL — ABNORMAL HIGH (ref 0.61–1.24)
GFR, Estimated: 59 mL/min — ABNORMAL LOW (ref >60)
Glucose, Bld: 100 mg/dL — ABNORMAL HIGH (ref 70–99)
Potassium: 4.6 mmol/L (ref 3.5–5.1)
Sodium: 135 mmol/L (ref 135–145)

## 2021-02-25 LAB — CBC
HCT: 42.3 % (ref 39.0–52.0)
Hemoglobin: 14.4 g/dL (ref 13.0–17.0)
MCH: 31 pg (ref 26.0–34.0)
MCHC: 34 g/dL (ref 30.0–36.0)
MCV: 91 fL (ref 80.0–100.0)
Platelets: 191 10*3/uL (ref 150–400)
RBC: 4.65 MIL/uL (ref 4.22–5.81)
RDW: 14.1 % (ref 11.5–15.5)
WBC: 6.2 10*3/uL (ref 4.0–10.5)
nRBC: 0 % (ref 0.0–0.2)

## 2021-02-25 LAB — BRAIN NATRIURETIC PEPTIDE: B Natriuretic Peptide: 58.9 pg/mL (ref 0.0–100.0)

## 2021-02-25 MED ORDER — EPLERENONE 25 MG PO TABS
25.0000 mg | ORAL_TABLET | Freq: Every day | ORAL | 6 refills | Status: DC
Start: 2021-02-25 — End: 2022-04-29

## 2021-02-25 NOTE — Progress Notes (Signed)
Advanced Heart Failure Clinic Note   Referring Physician: PCP: Leamon Arnt, MD PCP-Cardiologist: None   HPI  [name] is a 75  year old with obesity, HTN, OSA on CPAP, HL, prostate CA, PAF, CAD s/p CABG 78/5/88 and systolic HF.   Had ECHO in 2020 that showed EF 60-65% with normal RV.    Presented to [facility] via EMS with chest pain and shortness of breath. Arrived in the ED was in A fib RVR and had ST depression. HS Trop 5027>7412. Cath showed severe multivessel disease with elevated filling pressures. Echo EF 25-30% with mild AS. IABP placed. Underwent CABG 05/18/20 with Maze and LAA clipping. LIMA -> LAD, RIMA  -> PDA. Left radial OM1-> OM2. Post CABG course was slow but relatively uneventful. Discharged home on 10/13. - ECHO repeated 10/10 and showed EF improved 40-45% normal RV.   Has been struggling with several UTIs and hematuria over the past few months. Eliquis stopped in 4/22 and has not restarted. No further bleeding. Still being treated for UTIs.  Feels pretty good but easily SOB. No CP, or palpitations. BP had been running low. Bisoprolol stopped.  Now SBP range 97/46 ->134/67 average SBP 110-120. Frequently gets dizzy when standing. Eating very low salt.    Studies:   RHC/LHC 05/17/20  Ost LM to Mid LM lesion is 99% stenosed. Ost RCA to Prox RCA lesion is 99% stenosed. Prox RCA to Mid RCA lesion is 80% stenosed. RA 17 PA 40/11 PCWP 28  CO 5 CI 2.2   Echo 05/17/20  1. There is akinesis of the mid-to-distal anterolateral, all  mid-to-distal septal, mid-to-distal anterior, and all apical segments. The  rest of the LV segments are hypokinetic. Findings are suggestive of  multivessel CAD.Marland Kitchen Left ventricular ejection  fraction, by estimation, is 25 to 30%. The left ventricle has severely  decreased function. The left ventricle demonstrates regional wall motion  abnormalities (see scoring diagram/findings for description). There is  moderate concentric left ventricular   hypertrophy. Left ventricular diastolic parameters are indeterminate.   2. Right ventricular systolic function is normal. The right ventricular  size is normal. There is mildly elevated pulmonary artery systolic  pressure.   3. Left atrial size was moderately dilated.   4. The mitral valve is degenerative. Mild mitral valve regurgitation. No  evidence of mitral stenosis.   5. The aortic valve is tricuspid. There is moderate calcification of the  aortic valve. There is moderate thickening of the aortic valve. Aortic  valve regurgitation is not visualized. Mild aortic valve stenosis.   6. The inferior vena cava is dilated in size with >50% respiratory  variability, suggesting right atrial pressure of 8 mmHg.   Echo 10/21  EF 40-45%    Past Medical History:  Diagnosis Date   Aortic stenosis, mild    noted on ECHO   Asthma    Benign essential hypertension, on Norvasc and Amlodipine 12/21/2008   Followed by [facility]    BPH (benign prostatic hyperplasia) 09/03/2012   Carotid bruit present 06/15/2018   2015 Carotid Dopplers:  Essentially normal carotid arteries, with very slight hard plaque in the proximal ICA's and serpentine distal vessels. 1-39% bilateral ICA stenosis. Patent vertebral arteries with antegrade flow. Normal subclavian arteries, bilaterally.   CHF (congestive heart failure) ([place])    Colonic polyp 01/09/2020   COPD (chronic obstructive pulmonary disease) ([place])    Coronary artery disease    Dyslipidemia 12/21/2008   Qualifier: Diagnosis of  By: Percival Spanish, MD,  Farrel Gordon     Essential hypertension, benign 12/21/2008   Qualifier: Diagnosis of  By: Percival Spanish, MD, Farrel Gordon     Former smoker, stopped smoking in distant past 03/15/2018   Hematuria 09/16/2011   History of migraine    Hyperlipidemia    OA (osteoarthritis)    Obese    OSA on CPAP 09/25/2015   Diagnosed at the [facility]    Prostate cancer [facility]) 11/30/2019   PVC's (premature ventricular  contractions)    Seasonal allergies    Sinus bradycardia    Squamous cell carcinoma of skin    TIA (transient ischemic attack)    questionable   Vitamin B 12 deficiency    Vitamin D deficiency 11/26/2010    Current Outpatient Medications  Medication Sig Dispense Refill   albuterol (VENTOLIN HFA) 108 (90 Base) MCG/ACT inhaler Inhale 1-2 puffs into the lungs 4 (four) times daily as needed.     alfuzosin (UROXATRAL) 10 MG 24 hr tablet Take 10 mg by mouth daily with breakfast.     aspirin 81 MG EC tablet Take 81 mg by mouth daily. Swallow whole.     carboxymethylcellulose (REFRESH PLUS) 0.5 % SOLN Place 1 drop into both eyes 2 (two) times daily as needed (dry eyes).     cetirizine (ZYRTEC) 10 MG tablet Take 10 mg by mouth daily.     Cholecalciferol (VITAMIN D3) 2000 UNITS TABS Take 2,000 Int'l Units by mouth daily.      fluticasone (FLONASE) 50 MCG/ACT nasal spray USE TWO SPRAYS IN EACH NOSTRIL DAILY AS NEEDED. 48 mL 0   ketotifen (ZADITOR) 0.025 % ophthalmic solution Apply 1 drop to eye as needed.     latanoprost (XALATAN) 0.005 % ophthalmic solution INSTILL 1 DROP INTO BOTH EYES AT BEDTIME 11.25 mL 1   montelukast (SINGULAIR) 10 MG tablet TAKE 1 TABLET BY MOUTH EVERY DAY AT BEDTIME 90 tablet 3   NON FORMULARY Cpap at night     rosuvastatin (CRESTOR) 20 MG tablet Take 1 tablet (20 mg total) by mouth daily. 90 tablet 3   sacubitril-valsartan (ENTRESTO) 24-26 MG Take 1 tablet by mouth 2 (two) times daily. 180 tablet 3   sildenafil (VIAGRA) 100 MG tablet Take 0.5 tablets (50 mg total) by mouth daily as needed for erectile dysfunction. 20 tablet 1   spironolactone (ALDACTONE) 25 MG tablet TAKE 1 TABLET BY MOUTH EVERY DAY 90 tablet 1   trimethoprim (TRIMPEX) 100 MG tablet Take 100 mg by mouth at bedtime.     apixaban (ELIQUIS) 5 MG TABS tablet Take 1 tablet (5 mg total) by mouth 2 (two) times daily. (Patient not taking: Reported on 02/25/2021) 180 tablet 3   empagliflozin (JARDIANCE) 10 MG TABS  tablet Take 10 mg by mouth daily. (Patient not taking: Reported on 02/25/2021)     furosemide (LASIX) 80 MG tablet Take 0.5 tablets (40 mg total) by mouth daily as needed. (Patient not taking: Reported on 02/25/2021) 45 tablet 3   No current facility-administered medications for this encounter.    Allergies  Allergen Reactions   Tamsulosin Other (See Comments)    dizziness   Cipro [Ciprofloxacin Hcl] Swelling    Knee swelling, joint pain   Lipitor [Atorvastatin] Other (See Comments)    Leg Cramps   Lisinopril Cough      Social History   Socioeconomic History   Marital status: Married    Spouse name: Not on file   Number of children: Not on file  Years of education: 86   Highest education level: Not on file  Occupational History   Occupation: retired  Tobacco Use   Smoking status: Former    Pack years: 0.00    Types: Cigarettes    Quit date: 08/18/1965    Years since quitting: 55.5   Smokeless tobacco: Never   Tobacco comments:    pt quit smoking 53 yrs ago  Vaping Use   Vaping Use: Never used  Substance and Sexual Activity   Alcohol use: Yes    Alcohol/week: 2.0 - 6.0 standard drinks    Types: 2 - 6 Cans of beer per week   Drug use: No   Sexual activity: Yes  Other Topics Concern   Not on file  Social History Narrative   Is a [place] veteran, has trouble sleeping most nights. Often experiences nightmares w/anesthesia meds.   Social Determinants of Health   Financial Resource Strain: Low Risk    Difficulty of Paying Living Expenses: Not hard at all  Food Insecurity: No Food Insecurity   Worried About Charity fundraiser in the Last Year: Never true   [place] in the Last Year: Never true  Transportation Needs: No Transportation Needs   Lack of Transportation (Medical): No   Lack of Transportation (Non-Medical): No  Physical Activity: Unknown   Days of Exercise per Week: 7 days   Minutes of Exercise per Session: Not on file  Stress: No Stress Concern  Present   Feeling of Stress : Not at all  Social Connections: Moderately Isolated   Frequency of Communication with Friends and Family: More than three times a week   Frequency of Social Gatherings with Friends and Family: More than three times a week   Attends Religious Services: Never   Marine scientist or Organizations: No   Attends Archivist Meetings: Never   Marital Status: Married  Human resources officer Violence: Not At Risk   Fear of Current or Ex-Partner: No   Emotionally Abused: No   Physically Abused: No   Sexually Abused: No      Family History  Problem Relation Age of Onset   Diabetes Mother    COPD Father    Stroke Paternal Grandfather     Vitals:   02/25/21 1513  BP: 120/66  Pulse: 69  SpO2: 97%  Weight: 101.1 kg (222 lb 12.8 oz)  Height: 5' 7.25" (1.708 m)   Wt Readings from Last 3 Encounters:  02/25/21 101.1 kg (222 lb 12.8 oz)  10/18/20 103.8 kg (228 lb 12.8 oz)  10/15/20 101.9 kg (224 lb 10.4 oz)      PHYSICAL EXAM: General:  Well appearing. No resp difficulty HEENT: normal Neck: supple. no JVD. Carotids 2+ bilat; no bruits. No lymphadenopathy or thryomegaly appreciated. Cor: PMI nondisplaced. Regular rate & rhythm. 2/6 SE RSB Lungs: clear Abdomen: obese soft, nontender, nondistended. No hepatosplenomegaly. No bruits or masses. Good bowel sounds. Extremities: no cyanosis, clubbing, rash, edema Neuro: alert & orientedx3, cranial nerves grossly intact. moves all 4 extremities w/o difficulty. Affect pleasant   ASSESSMENT & PLAN:  1. CAD/NSTEMI with critcal LM disease - LHC 9/21  with severe multivessel disease.  - s/p CABG 05/18/20 with Maze and LAA clipping. LIMA -> LAD, RIMA  -> PDA. Left radial OM1-> OM2 - Doing very well. No s/s angina - continue ASA/Crestor 20 (myalgias with atorvastatin).  - has been exercising at [facility] without problem   2. Chronic Systolic Heart Failure due  to ischemic CM - ECHO in 2020 showed EF  60-65%.  - ECHO 9/21 25-30%. RV normal. Mild AS   - ECHO 05/27/2020 EF improved 40-45% normal RV.  - Improved NYHA II. Volume stable off of lasix. - Failed bisoprolol due to low BP  - Switch spiro 25 to eplerenone 25 due to gynecomastia - Continue Entresto 24-26 bid. BP too low to titrate  - Stop Jardiance with UTIs - Check labs today - Recheck echo at next visit  3. PAF  - s/p MAZE w/ LAA Clip  - In NSR today - Off Eliquis for now with hematuria. Will keep off for now. Can reconsider if UTIs abate.   4. H/o Prostate Cancer  - continue alfuzosin  5. Erectile dysfunction - continue sildenafil   Glori Bickers, MD 02/25/21

## 2021-02-25 NOTE — Patient Instructions (Addendum)
Stay off Jardiance  Stop Spironolactone  Start Eplerenone 25 mg Daily  Labs done today, your results will be available in MyChart, we will contact you for abnormal readings.  Your physician recommends that you schedule a follow-up appointment in: 4 months with echocardiogram  If you have any questions or concerns before your next appointment please send [place] a message through [place] or call our office at [phone].    TO LEAVE A MESSAGE FOR THE NURSE SELECT OPTION 2, PLEASE LEAVE A MESSAGE INCLUDING: YOUR NAME DATE OF BIRTH CALL BACK NUMBER REASON FOR CALL**this is important as we prioritize the call backs  YOU WILL RECEIVE A CALL BACK THE SAME DAY AS LONG AS YOU CALL BEFORE 4:00 PM  At the [facility], you and your health needs are our priority. As part of our continuing mission to provide you with exceptional heart care, we have created designated Provider Care Teams. These Care Teams include your primary Cardiologist (physician) and Advanced Practice Providers (APPs- Physician Assistants and Nurse Practitioners) who all work together to provide you with the care you need, when you need it.   You may see any of the following providers on your designated Care Team at your next follow up: Dr Glori Bickers Dr Loralie Champagne Dr Patrice Paradise, NP Lyda Jester, [place] Ginnie Smart Audry Riles, PharmD   Please be sure to bring in all your medications bottles to every appointment.

## 2021-03-11 ENCOUNTER — Ambulatory Visit: Payer: Non-veteran care

## 2021-04-01 ENCOUNTER — Telehealth: Payer: Self-pay | Admitting: Family Medicine

## 2021-04-01 NOTE — Telephone Encounter (Signed)
Copied from [place] [id number]. Topic: Medicare AWV >> Apr 01, 2021 10:07 AM Harris-Coley, Hannah Beat wrote: Reason for CRM: Left message for patient to schedule Annual Wellness Visit.  Please schedule with Nurse Health Advisor Charlott Rakes, RN at [facility].  Please call [phone] ask for [facility]

## 2021-06-12 ENCOUNTER — Other Ambulatory Visit (HOSPITAL_COMMUNITY): Payer: Self-pay | Admitting: *Deleted

## 2021-06-12 MED ORDER — ENTRESTO 24-26 MG PO TABS
1.0000 | ORAL_TABLET | Freq: Two times a day (BID) | ORAL | 0 refills | Status: DC
Start: 2021-06-12 — End: 2022-04-29

## 2021-06-25 ENCOUNTER — Telehealth (HOSPITAL_COMMUNITY): Payer: Self-pay | Admitting: Cardiology

## 2021-06-25 NOTE — Telephone Encounter (Signed)
Community care referral faxed to [facility]

## 2021-06-27 ENCOUNTER — Telehealth (HOSPITAL_COMMUNITY): Payer: Self-pay

## 2021-06-27 NOTE — Telephone Encounter (Signed)
Spoke with [place] and patients echo is not approved and was advised to go online to [email or web address] and look up form [id number].  Fill out form and fax [phone] along with office notes. [email or web address]

## 2021-06-27 NOTE — Telephone Encounter (Signed)
Patient is concerned that his next appointment and Echo may not be covered.  He ask that someone contact [facility] Rm) [phone] and get pre auth.  Gave patient the number to the billing department but he says he has not gotten an answer.

## 2021-06-27 NOTE — Telephone Encounter (Signed)
A community care referral was sent on 06/24/2021-awaiting approval

## 2021-07-02 ENCOUNTER — Ambulatory Visit (HOSPITAL_COMMUNITY): Payer: Medicare Other

## 2021-07-02 ENCOUNTER — Encounter (HOSPITAL_COMMUNITY): Payer: No Typology Code available for payment source | Admitting: Internal Medicine

## 2021-07-24 ENCOUNTER — Other Ambulatory Visit (HOSPITAL_COMMUNITY): Payer: Self-pay | Admitting: Urology

## 2021-07-24 ENCOUNTER — Other Ambulatory Visit: Payer: Self-pay | Admitting: Urology

## 2021-07-24 DIAGNOSIS — C61 Malignant neoplasm of prostate: Secondary | ICD-10-CM

## 2021-08-13 ENCOUNTER — Other Ambulatory Visit: Payer: Self-pay

## 2021-08-13 ENCOUNTER — Ambulatory Visit (HOSPITAL_COMMUNITY)
Admission: RE | Admit: 2021-08-13 | Discharge: 2021-08-13 | Disposition: A | Discharge status: Home / Self Care | Payer: No Typology Code available for payment source | Source: Ambulatory Visit | Attending: Urology | Admitting: Urology

## 2021-08-13 DIAGNOSIS — C61 Malignant neoplasm of prostate: Secondary | ICD-10-CM | POA: Diagnosis not present

## 2021-08-13 MED ORDER — GADOBUTROL 1 MMOL/ML IV SOLN
10.0000 mL | Freq: Once | INTRAVENOUS | Status: AC | PRN
  Administered 2021-08-13: 12:00:00 10 mL via INTRAVENOUS

## 2021-09-26 ENCOUNTER — Other Ambulatory Visit: Payer: Self-pay

## 2021-09-26 ENCOUNTER — Inpatient Hospital Stay (HOSPITAL_COMMUNITY): Admission: RE | Admit: 2021-09-26 | Payer: No Typology Code available for payment source | Source: Ambulatory Visit

## 2021-09-26 ENCOUNTER — Encounter (HOSPITAL_COMMUNITY): Payer: Self-pay | Admitting: Internal Medicine

## 2021-09-26 ENCOUNTER — Ambulatory Visit (HOSPITAL_COMMUNITY)
Admission: RE | Admit: 2021-09-26 | Discharge: 2021-09-26 | Disposition: A | Discharge status: Home / Self Care | Payer: Medicare Other | Source: Ambulatory Visit | Attending: Internal Medicine | Admitting: Internal Medicine

## 2021-09-26 VITALS — BP 140/62 | HR 78 | Wt 230.6 lb

## 2021-09-26 DIAGNOSIS — E669 Obesity, unspecified: Secondary | ICD-10-CM | POA: Diagnosis not present

## 2021-09-26 DIAGNOSIS — Z9989 Dependence on other enabling machines and devices: Secondary | ICD-10-CM | POA: Diagnosis not present

## 2021-09-26 DIAGNOSIS — I498 Other specified cardiac arrhythmias: Secondary | ICD-10-CM | POA: Diagnosis not present

## 2021-09-26 DIAGNOSIS — Z7982 Long term (current) use of aspirin: Secondary | ICD-10-CM | POA: Diagnosis not present

## 2021-09-26 DIAGNOSIS — I255 Ischemic cardiomyopathy: Secondary | ICD-10-CM | POA: Insufficient documentation

## 2021-09-26 DIAGNOSIS — Z951 Presence of aortocoronary bypass graft: Secondary | ICD-10-CM | POA: Diagnosis not present

## 2021-09-26 DIAGNOSIS — E785 Hyperlipidemia, unspecified: Secondary | ICD-10-CM | POA: Insufficient documentation

## 2021-09-26 DIAGNOSIS — G4733 Obstructive sleep apnea (adult) (pediatric): Secondary | ICD-10-CM | POA: Insufficient documentation

## 2021-09-26 DIAGNOSIS — I110 Hypertensive heart disease with heart failure: Secondary | ICD-10-CM | POA: Insufficient documentation

## 2021-09-26 DIAGNOSIS — N529 Male erectile dysfunction, unspecified: Secondary | ICD-10-CM | POA: Diagnosis not present

## 2021-09-26 DIAGNOSIS — I252 Old myocardial infarction: Secondary | ICD-10-CM | POA: Insufficient documentation

## 2021-09-26 DIAGNOSIS — I5022 Chronic systolic (congestive) heart failure: Secondary | ICD-10-CM | POA: Diagnosis not present

## 2021-09-26 DIAGNOSIS — I480 Paroxysmal atrial fibrillation: Secondary | ICD-10-CM | POA: Diagnosis not present

## 2021-09-26 DIAGNOSIS — Z8546 Personal history of malignant neoplasm of prostate: Secondary | ICD-10-CM | POA: Insufficient documentation

## 2021-09-26 DIAGNOSIS — I2510 Atherosclerotic heart disease of native coronary artery without angina pectoris: Secondary | ICD-10-CM | POA: Insufficient documentation

## 2021-09-26 DIAGNOSIS — Z79899 Other long term (current) drug therapy: Secondary | ICD-10-CM | POA: Diagnosis not present

## 2021-09-26 DIAGNOSIS — Z87440 Personal history of urinary (tract) infections: Secondary | ICD-10-CM | POA: Insufficient documentation

## 2021-09-26 NOTE — Patient Instructions (Signed)
Thank you for your visit today.  There has been no changes to your medications.  Your physician recommends that you schedule a follow-up appointment in: 1 year ( February 2024)  ** please call the office in December 2023 to arrange your appointment**  If you have any questions or concerns before your next appointment please send [place] a message through [place] or call our office at [phone].    TO LEAVE A MESSAGE FOR THE NURSE SELECT OPTION 2, PLEASE LEAVE A MESSAGE INCLUDING: YOUR NAME DATE OF BIRTH CALL BACK NUMBER REASON FOR CALL**this is important as we prioritize the call backs  YOU WILL RECEIVE A CALL BACK THE SAME DAY AS LONG AS YOU CALL BEFORE 4:00 PM  At the [facility], you and your health needs are our priority. As part of our continuing mission to provide you with exceptional heart care, we have created designated Provider Care Teams. These Care Teams include your primary Cardiologist (physician) and Advanced Practice Providers (APPs- Physician Assistants and Nurse Practitioners) who all work together to provide you with the care you need, when you need it.   You may see any of the following providers on your designated Care Team at your next follow up: Dr Glori Bickers Dr Haynes Kerns, NP Lyda Jester, [place] [facility] [place], [place] Audry Riles, PharmD   Please be sure to bring in all your medications bottles to every appointment.

## 2021-09-26 NOTE — Progress Notes (Signed)
Patient requested assistance with obtaining Community Care authorization from the [place]. Patient states he has had approval in the past but "it expired". Patient has been seen by a Cardiologist at the [place] but states he would prefer to continue to be seen here at the [facility] clinic. Patient provided the contact information at the [place] although states he has no contacts and is not familiar with the process. CSW will explore and return call to patient with outcome. Raquel Sarna, [place], [place]

## 2021-09-26 NOTE — Progress Notes (Signed)
Advanced Heart Failure Clinic Note   Referring Physician: PCP: Leamon Arnt, MD PCP-Cardiologist: Dr. Haroldine Laws    HPI  [name] is a 76  year old with obesity, HTN, OSA on CPAP, HL, prostate CA, PAF, CAD s/p CABG 97/3/53 and systolic HF.   Had ECHO in 2020 that showed EF 60-65% with normal RV.    Presented to [facility] via EMS with chest pain and shortness of breath. Arrived in the ED was in A fib RVR and had ST depression. HS Trop 2992>4268. Cath showed severe multivessel disease with elevated filling pressures. Echo EF 25-30% with mild AS. IABP placed. Underwent CABG 05/18/20 with Maze and LAA clipping. LIMA -> LAD, RIMA  -> PDA. Left radial OM1-> OM2. Post CABG course was slow but relatively uneventful. - ECHO repeated 10/10 and showed EF improved 40-45% normal RV. Discharged home on 05/30/20.   Several med intolerances. Bisoprolol stopped due to low BP. Developed gynecomastia w/ spiro. Now on eplerenone. Wilder Glade stopped due recurrent UTIs. Off Eliquis due to hematuria.  He presents today for routine f/u. Also followed at the [facility] hospital. Has struggled recently w/ exertional dyspnea. No chest pain. Had nuclear stress test done at the [place] last week. He has result note with him. Study showed no ischemia. EF was 54%.   BP mildly elevated in clinic today, 140/62. Checked x 2. Usually well controlled at home ~341 systolic. Last week he had low BP w/ dizziness but no further recurrence.    Studies:   RHC/LHC 05/17/20  Ost LM to Mid LM lesion is 99% stenosed. Ost RCA to Prox RCA lesion is 99% stenosed. Prox RCA to Mid RCA lesion is 80% stenosed. RA 17 PA 40/11 PCWP 28  CO 5 CI 2.2   Echo 05/17/20  1. There is akinesis of the mid-to-distal anterolateral, all  mid-to-distal septal, mid-to-distal anterior, and all apical segments. The  rest of the LV segments are hypokinetic. Findings are suggestive of  multivessel CAD.Marland Kitchen Left ventricular ejection  fraction, by estimation, is 25 to 30%.  The left ventricle has severely  decreased function. The left ventricle demonstrates regional wall motion  abnormalities (see scoring diagram/findings for description). There is  moderate concentric left ventricular  hypertrophy. Left ventricular diastolic parameters are indeterminate.   2. Right ventricular systolic function is normal. The right ventricular  size is normal. There is mildly elevated pulmonary artery systolic  pressure.   3. Left atrial size was moderately dilated.   4. The mitral valve is degenerative. Mild mitral valve regurgitation. No  evidence of mitral stenosis.   5. The aortic valve is tricuspid. There is moderate calcification of the  aortic valve. There is moderate thickening of the aortic valve. Aortic  valve regurgitation is not visualized. Mild aortic valve stenosis.   6. The inferior vena cava is dilated in size with >50% respiratory  variability, suggesting right atrial pressure of 8 mmHg.   Echo 10/21  EF 40-45%    Past Medical History:  Diagnosis Date   Aortic stenosis, mild    noted on ECHO   Asthma    Benign essential hypertension, on Norvasc and Amlodipine 12/21/2008   Followed by [facility]    BPH (benign prostatic hyperplasia) 09/03/2012   Carotid bruit present 06/15/2018   2015 Carotid Dopplers:  Essentially normal carotid arteries, with very slight hard plaque in the proximal ICA's and serpentine distal vessels. 1-39% bilateral ICA stenosis. Patent vertebral arteries with antegrade flow. Normal subclavian arteries, bilaterally.  CHF (congestive heart failure) (HCC)    Colonic polyp 01/09/2020   COPD (chronic obstructive pulmonary disease) (HCC)    Coronary artery disease    Dyslipidemia 12/21/2008   Qualifier: Diagnosis of  By: Percival Spanish, MD, Farrel Gordon     Essential hypertension, benign 12/21/2008   Qualifier: Diagnosis of  By: Percival Spanish, MD, Farrel Gordon     Former smoker, stopped smoking in distant past 03/15/2018   Hematuria 09/16/2011    History of migraine    Hyperlipidemia    OA (osteoarthritis)    Obese    OSA on CPAP 09/25/2015   Diagnosed at the [facility] 2016    Prostate cancer [facility]) 11/30/2019   PVC's (premature ventricular contractions)    Seasonal allergies    Sinus bradycardia    Squamous cell carcinoma of skin    TIA (transient ischemic attack)    questionable   Vitamin B 12 deficiency    Vitamin D deficiency 11/26/2010    Current Outpatient Medications  Medication Sig Dispense Refill   albuterol (VENTOLIN HFA) 108 (90 Base) MCG/ACT inhaler Inhale 1-2 puffs into the lungs 4 (four) times daily as needed.     alfuzosin (UROXATRAL) 10 MG 24 hr tablet Take 10 mg by mouth daily with breakfast.     aspirin 81 MG EC tablet Take 81 mg by mouth daily. Swallow whole.     B Complex Vitamins (VITAMIN B COMPLEX PO) Take 1 tablet by mouth daily.     carboxymethylcellulose (REFRESH PLUS) 0.5 % SOLN Place 1 drop into both eyes 2 (two) times daily as needed (dry eyes).     cetirizine (ZYRTEC) 10 MG tablet Take 10 mg by mouth daily.     Cholecalciferol (VITAMIN D3) 2000 UNITS TABS Take 2,000 Int'l Units by mouth daily.      dutasteride (AVODART) 0.5 MG capsule Take 0.5 mg by mouth daily.     eplerenone (INSPRA) 25 MG tablet Take 1 tablet (25 mg total) by mouth daily. 30 tablet 6   fluticasone (FLONASE) 50 MCG/ACT nasal spray USE TWO SPRAYS IN EACH NOSTRIL DAILY AS NEEDED. 48 mL 0   ibuprofen (ADVIL) 400 MG tablet Take 400 mg by mouth at bedtime.     ketotifen (ZADITOR) 0.025 % ophthalmic solution Apply 1 drop to eye as needed.     latanoprost (XALATAN) 0.005 % ophthalmic solution INSTILL 1 DROP INTO BOTH EYES AT BEDTIME 11.25 mL 1   montelukast (SINGULAIR) 10 MG tablet TAKE 1 TABLET BY MOUTH EVERY DAY AT BEDTIME 90 tablet 3   NON FORMULARY Cpap at night     rosuvastatin (CRESTOR) 20 MG tablet Take 1 tablet (20 mg total) by mouth daily. 90 tablet 3   sacubitril-valsartan (ENTRESTO) 24-26 MG Take 1 tablet by mouth 2  (two) times daily. 60 tablet 0   sildenafil (VIAGRA) 100 MG tablet Take 0.5 tablets (50 mg total) by mouth daily as needed for erectile dysfunction. 20 tablet 1   No current facility-administered medications for this encounter.    Allergies  Allergen Reactions   Tamsulosin Other (See Comments)    dizziness   Cipro [Ciprofloxacin Hcl] Swelling    Knee swelling, joint pain   Lipitor [Atorvastatin] Other (See Comments)    Leg Cramps   Lisinopril Cough      Social History   Socioeconomic History   Marital status: Married    Spouse name: Not on file   Number of children: Not on file   Years of education: 25  Highest education level: Not on file  Occupational History   Occupation: retired  Tobacco Use   Smoking status: Former    Types: Cigarettes    Quit date: 08/18/1965    Years since quitting: 56.1   Smokeless tobacco: Never   Tobacco comments:    pt quit smoking 53 yrs ago  Vaping Use   Vaping Use: Never used  Substance and Sexual Activity   Alcohol use: Yes    Alcohol/week: 2.0 - 6.0 standard drinks    Types: 2 - 6 Cans of beer per week   Drug use: No   Sexual activity: Yes  Other Topics Concern   Not on file  Social History Narrative   Is a [place] veteran, has trouble sleeping most nights. Often experiences nightmares w/anesthesia meds.   Social Determinants of Health   Financial Resource Strain: Not on file  Food Insecurity: Not on file  Transportation Needs: Not on file  Physical Activity: Not on file  Stress: Not on file  Social Connections: Not on file  Intimate Partner Violence: Not on file      Family History  Problem Relation Age of Onset   Diabetes Mother    COPD Father    Stroke Paternal Grandfather     Vitals:   09/26/21 1519  BP: 140/62  Pulse: 78  SpO2: 98%  Weight: 104.6 kg (230 lb 9.6 oz)    Wt Readings from Last 3 Encounters:  09/26/21 104.6 kg (230 lb 9.6 oz)  02/25/21 101.1 kg (222 lb 12.8 oz)  10/18/20 103.8 kg (228 lb  12.8 oz)   PHYSICAL EXAM: General:  Well appearing. No respiratory difficulty HEENT: normal Neck: supple. no JVD. Carotids 2+ bilat; no bruits. No lymphadenopathy or thyromegaly appreciated. Cor: PMI nondisplaced. Regular rate & rhythm. No rubs, gallops or murmurs. Lungs: clear Abdomen: soft, nontender, nondistended. No hepatosplenomegaly. No bruits or masses. Good bowel sounds. Extremities: no cyanosis, clubbing, rash, edema Neuro: alert & oriented x 3, cranial nerves grossly intact. moves all 4 extremities w/o difficulty. Affect pleasant.   ASSESSMENT & PLAN:  1. CAD/NSTEMI with critcal LM disease - LHC 9/21  with severe multivessel disease.  - s/p CABG 05/18/20 with Maze and LAA clipping. LIMA -> LAD, RIMA  -> PDA. Left radial OM1-> OM2 - No chest pain - N[facility] 2/23 at [facility] negative for ischemia  - on ASA, statin and ? blocker  2. Chronic Systolic Heart Failure due to ischemic CM - ECHO in 2020 showed EF 60-65%.  - ECHO 9/21 25-30%. RV normal. Mild AS   - ECHO 05/27/2020 EF improved 40-45% normal RV - Nuclear Stress test at [facility] 2/23 w/ EF 54%  - NYHA Class II. Euvolemic on exam  - Failed bisoprolol due to low BP  - Continue eplerenone 25 mg (failed spiro due to gynecomastia) - Continue Entresto 24-26 bid. No titration w/ issues w/ low BP   - Off Jardiance with UTIs  3. PAF  - s/p MAZE w/ LAA Clip  - In NSR today on EKG  - Off Eliquis with hematuria.    4. H/o Prostate Cancer  - continue alfuzosin  5. Erectile dysfunction - continue sildenafil   F/u in 1 year w/ Dr. Odelia Gage, PA-C 09/26/21   Patient seen and examined with the above-signed Advanced Practice Provider and/or Housestaff. I personally reviewed laboratory data, imaging studies and relevant notes. I independently examined the patient and formulated the important aspects of the plan.  I have edited the note to reflect any of my changes or salient points. I have personally  discussed the plan with the patient and/or family.  Doing well. NYHA II Now followed at the [facility]. Denies CP or SOB. Recent nuclear stress test reviewed and no ischemia. EF 54%  Maintaining NSR.   General:  Well appearing. No resp difficulty HEENT: normal Neck: supple. no JVD. Carotids 2+ bilat; no bruits. No lymphadenopathy or thryomegaly appreciated. Cor: PMI nondisplaced. Regular rate & rhythm. No rubs, gallops or murmurs. Lungs: clear Abdomen: obese soft, nontender, nondistended. No hepatosplenomegaly. No bruits or masses. Good bowel sounds. Extremities: no cyanosis, clubbing, rash, edema Neuro: alert & orientedx3, cranial nerves grossly intact. moves all 4 extremities w/o difficulty. Affect pleasant  Doing well. On good meds. He will follow at the [facility] We will see him once a year to keep tabs on him.  Glori Bickers, MD  7:45 PM

## 2021-10-01 ENCOUNTER — Telehealth (HOSPITAL_COMMUNITY): Payer: Self-pay | Admitting: Licensed Clinical Social Worker

## 2021-10-01 NOTE — Telephone Encounter (Signed)
[facility] contacted [facility] per [facility]'s request to inquire about Community Care approval for care in the HF Clinic. CSW informed by the [facility] program that patient will need to request community care through his [place] PCP or through the [place] Cardiologist if he has one currently. CSW returned call to patient's home and spoke with his wife as he was not available. CSW provided the information to proceed with Community care approval. Wife verbalizes understanding and will return call if needed. Raquel Sarna, [place], [place]

## 2022-03-10 ENCOUNTER — Other Ambulatory Visit: Payer: Self-pay | Admitting: Urology

## 2022-03-31 ENCOUNTER — Telehealth: Payer: Self-pay | Admitting: *Deleted

## 2022-03-31 ENCOUNTER — Telehealth: Payer: Self-pay | Admitting: Cardiovascular Disease

## 2022-03-31 NOTE — Telephone Encounter (Signed)
Pt agreeable to plan of care for tele pre op appt 04/03/22 @ 10:20. Med rec and consent are done.     Patient Consent for Virtual Visit        Eric Stokes has provided verbal consent on 03/31/2022 for a virtual visit (video or telephone).   CONSENT FOR VIRTUAL VISIT FOR:  Eric Stokes  By participating in this virtual visit I agree to the following:  I hereby voluntarily request, consent and authorize [facility] and its employed or contracted physicians, physician assistants, nurse practitioners or other licensed health care professionals (the Practitioner), to provide me with telemedicine health care services (the "Services") as deemed necessary by the treating Practitioner. I acknowledge and consent to receive the Services by the Practitioner via telemedicine. I understand that the telemedicine visit will involve communicating with the Practitioner through live audiovisual communication technology and the disclosure of certain medical information by electronic transmission. I acknowledge that I have been given the opportunity to request an in-person assessment or other available alternative prior to the telemedicine visit and am voluntarily participating in the telemedicine visit.  I understand that I have the right to withhold or withdraw my consent to the use of telemedicine in the course of my care at any time, without affecting my right to future care or treatment, and that the Practitioner or I may terminate the telemedicine visit at any time. I understand that I have the right to inspect all information obtained and/or recorded in the course of the telemedicine visit and may receive copies of available information for a reasonable fee.  I understand that some of the potential risks of receiving the Services via telemedicine include:  Delay or interruption in medical evaluation due to technological equipment failure or disruption; Information transmitted may not be sufficient  (e.g. poor resolution of images) to allow for appropriate medical decision making by the Practitioner; and/or  In rare instances, security protocols could fail, causing a breach of personal health information.  Furthermore, I acknowledge that it is my responsibility to provide information about my medical history, conditions and care that is complete and accurate to the best of my ability. I acknowledge that Practitioner's advice, recommendations, and/or decision may be based on factors not within their control, such as incomplete or inaccurate data provided by me or distortions of diagnostic images or specimens that may result from electronic transmissions. I understand that the practice of medicine is not an exact science and that Practitioner makes no warranties or guarantees regarding treatment outcomes. I acknowledge that a copy of this consent can be made available to me via my patient portal ([facility]), or I can request a printed copy by calling the office of [facility].    I understand that my insurance will be billed for this visit.   I have read or had this consent read to me. I understand the contents of this consent, which adequately explains the benefits and risks of the Services being provided via telemedicine.  I have been provided ample opportunity to ask questions regarding this consent and the Services and have had my questions answered to my satisfaction. I give my informed consent for the services to be provided through the use of telemedicine in my medical care

## 2022-03-31 NOTE — Telephone Encounter (Signed)
   Pre-operative Risk Assessment    Patient Name: Eric Stokes  DOB: 06-Sep-1945 MRN: [id number]      Request for Surgical Clearance    Procedure:  Prostate Surgery  Date of Surgery:  04-15-22 Clearance                                  Surgeon:  Dr Dell Ponto Surgeon's Group or Practice Name:   Phone number:  [phone] Fax number:  [phone]   Type of Clearance Requested:   - Medical  - Pharmacy:  Hold Aspirin     Type of Anesthesia:  General    Additional requests/questions:    SignedGlyn Ade   03/31/2022, 10:15 AM

## 2022-03-31 NOTE — Telephone Encounter (Signed)
Pt agreeable to plan of care for tele pre op appt 04/03/22 @ 10:20. Med rec and consent are done.

## 2022-03-31 NOTE — Telephone Encounter (Unsigned)
   Name: Eric Stokes  DOB: October 10, 1945  MRN: [id number]  Primary Cardiologist: Dr. Haroldine Laws    Preoperative team, please contact this patient and set up a phone call appointment for further preoperative risk assessment. Please obtain consent and complete medication review. Thank you for your help.  I confirm that guidance regarding antiplatelet and oral anticoagulation therapy has been completed and, if necessary, noted below.  Per pre-op algorithm, prostate surgeries are normally have higher risk of bleeding so should be OK to hold Aspirin.    Darreld Mclean, PA-C 03/31/2022, 12:08 PM [facility]

## 2022-04-02 ENCOUNTER — Encounter (HOSPITAL_BASED_OUTPATIENT_CLINIC_OR_DEPARTMENT_OTHER): Payer: Self-pay | Admitting: Urology

## 2022-04-03 ENCOUNTER — Encounter (HOSPITAL_BASED_OUTPATIENT_CLINIC_OR_DEPARTMENT_OTHER): Payer: Self-pay | Admitting: Urology

## 2022-04-03 ENCOUNTER — Ambulatory Visit (INDEPENDENT_AMBULATORY_CARE_PROVIDER_SITE_OTHER): Payer: Medicare Other | Admitting: Nurse Practitioner

## 2022-04-03 DIAGNOSIS — Z01810 Encounter for preprocedural cardiovascular examination: Secondary | ICD-10-CM | POA: Diagnosis not present

## 2022-04-03 NOTE — Progress Notes (Addendum)
Spoke w/ via phone for pre-op interview--- pt Lab needs dos----  [employer] results------ current ekg in epic/ chart COVID test -----patient states asymptomatic no test needed Arrive at ------- 0530 on 04-15-2022 NPO after MN NO Solid Food.  Clear liquids from MN until--- 0430 Med rec completed Medications to take morning of surgery ----- entresto, crestor Diabetic medication ----- n/a Patient instructed no nail polish to be worn day of surgery Patient instructed to bring photo id and insurance card day of surgery Patient aware to have Driver (ride ) / caregiver  for 24 hours after surgery -- wife, elaine Patient Special Instructions ----- pt's rescue inhaler is not current, stated lasted used 2 yrs ago Pre-Op special Istructions -----  pt has telephone visit cardiac clearance by Diona Browner NP on 04-03-2022 in epic/ chart Patient verbalized understanding of instructions that were given at this phone interview. Patient denies shortness of breath, chest pain, fever, cough at this phone interview.    Anesthesia Review:  HTN;  CAD / NSTEMI/ Afib w RVR/ ICM (current ef 40-45%) / acute HF in 04/2020  s/p CABG x4 and MAZE procedure 10/ 2021;  PAF;  chronic systolic CHF;  COPD;  OSA stated uses cpap nightly;   Pt denies cardiac s&s, sob, and no peripheral swelling.  PCP:  Dr C. Jonni Sanger [facility]) Cardiologist : Dr Haroldine Laws  [facility] 09-26-2021 epic) Chest x-ray : 06-11-2020 EKG : 09-26-2021  Echo : 05-27-2020 epic Stress test:  09-17-2021  care everywhere Cardiac Cath : 05-17-2020 epic Activity level: denies sob w/ any activity Sleep Study/ CPAP : Yes/  Yes  Blood Thinner/ Instructions Maryjane Hurter Dose:  no ASA / Instructions/ Last Dose :  ASA '81mg'$ /  pt stated was given instructions from Dr Junious Silk office to stop 5 days prior to surgery on 04-15-2022 after office given clearance by cardiology

## 2022-04-03 NOTE — Progress Notes (Signed)
Virtual Visit via Telephone Note   Because of Eric Stokes's co-morbid illnesses, he is at least at moderate risk for complications without adequate follow up.  This format is felt to be most appropriate for this patient at this time.  The patient did not have access to video technology/had technical difficulties with video requiring transitioning to audio format only (telephone).  All issues noted in this document were discussed and addressed.  No physical exam could be performed with this format.  Please refer to the patient's chart for his consent to telehealth for [facility].  Evaluation Performed:  Preoperative cardiovascular risk assessment _____________   Date:  04/03/2022   Patient ID:  Eric Stokes, DOB Dec 24, 1945, MRN [id number] Patient Location:  Home Provider location:   Office  Primary Care Provider:  Leamon Arnt, MD Primary Cardiologist:  None  Chief Complaint / Patient Profile   76 y.o. y/o male with a h/o CAD s/p CABG x 4 with MAZE and LAA clipping, ICM, chronic systolic heart failure, paroxysmal atrial fibrillation (not on DOAC due to hematuria), aortic stenosis, hyperlipidemia, prostate cancer, and ED who is pending prostate surgery on 04/15/2022 with Dr. Dell Ponto and presents today for telephonic preoperative cardiovascular risk assessment.  Past Medical History    Past Medical History:  Diagnosis Date   Aortic stenosis, mild    last echo in epic 05-27-2020  ef 40-45%,  mean grandiant 73mHg, valve area 2.46 cm^2 ,  mild regurg   BPH with obstruction/lower urinary tract symptoms    urologist--- dr eskridge   Carotid bruit present 06/15/2018   2015 Carotid Dopplers:  Essentially normal carotid arteries, with very slight hard plaque in the proximal ICA's and serpentine distal vessels. 1-39% bilateral ICA stenosis. Patent vertebral arteries with antegrade flow. Normal subclavian arteries, bilaterally.   Chronic systolic CHF (congestive  heart failure) [place] 05[place]   followed by dr bAve Filter  COPD (chronic obstructive pulmonary disease) ([place]    Coronary artery disease 04/2020   cardiologist--- dr bHaroldine Laws   NSTEMI /  Afib w/ RVR/  Acute CHF/  cardiogenic shock 04-20-2020;  cath 05-17-2020 multivessel disease w/ critical LM;   05-18-2020 s/p CABG x4 and Maze procedure;  [facility], ef 54%   Essential hypertension, benign 12/21/2008   Qualifier: Diagnosis of  By: HPercival Spanish MD, FFarrel Gordon    History of migraine    History of non-ST elevation myocardial infarction (NSTEMI) 05/17/2020   cardiogenic shock,  AF w/ RVR, acute CHF,  ICM;   s/p CABG w/ MAZE   History of squamous cell carcinoma of skin    History of TIA (transient ischemic attack) 02/28/2019   questionable TIA  per neurology note dr sLeonie Man 04-12-2019, likely migraine   Hx of colonic polyps 01/09/2020   Hyperlipidemia    Ischemic cardiomyopathy 04/2020   per cath 09-03-20212  ef 25-30%;   last echo in epic 05-27-2020  ef 40-45%   Malignant neoplasm prostate [place] 10/2019   urologist--- dr eJunious Silk  dx 03/ 2021  active survillance   OA (osteoarthritis)    OSA on CPAP 09/25/2015   [facility]    PAF (paroxysmal atrial fibrillation) (HCC)    PVC's (premature ventricular contractions)    S/P CABG x 4 05/18/2020   LIMA to LAD,  RIMA to PDA, SVG to OM1 and OM2   Seasonal allergic rhinitis    Sinus bradycardia  event monitor 01-29-2017  mild bradycardia, no pauses, rare PVCs   Vitamin B 12 deficiency    Vitamin D deficiency 11/26/2010   Past Surgical History:  Procedure Laterality Date   CLIPPING OF ATRIAL APPENDAGE Left 05/18/2020   Procedure: CLIPPING OF ATRIAL APPENDAGE USING ATRICURE 3 MM ATRICLIP;  Surgeon: Wonda Olds, MD;  Location: [place];  Service: Open Heart Surgery;  Laterality: Left;   COLONOSCOPY     CORONARY ARTERY BYPASS GRAFT N/A 05/18/2020   Procedure: CORONARY  ARTERY BYPASS GRAFTING (CABG) TIMES FOUR USING INTERNAL BILATERAL MAMMARY ARTERIES AND HARVESTED LEFT RADIAL ARTERY.;  Surgeon: Wonda Olds, MD;  Location: [place];  Service: Open Heart Surgery;  Laterality: N/A;  CORONARY ENDARTERECTOMY PERFORMED DURING SURGERY    IABP INSERTION N/A 05/17/2020   Procedure: IABP INSERTION;  Surgeon: Jolaine Artist, MD;  Location: [place] [facility];  Service: Cardiovascular;  Laterality: N/A;   KNEE ARTHROPLASTY Right 03/14/2020   Procedure: COMPUTER ASSISTED TOTAL KNEE ARTHROPLASTY;  Surgeon: Rod Can, MD;  Location: [facility];  Service: Orthopedics;  Laterality: Right;   LAPAROSCOPIC CHOLECYSTECTOMY  02/25/2010   '@[facility]'$   by dr Margot Chimes   MAZE N/A 05/18/2020   Procedure: MAZE USING ATRICURE ISOLATOR CLAMP OLL2;  Surgeon: Wonda Olds, MD;  Location: [place];  Service: Open Heart Surgery;  Laterality: N/A;   RADIAL ARTERY HARVEST Left 05/18/2020   Procedure: RADIAL ARTERY HARVEST;  Surgeon: Wonda Olds, MD;  Location: [place];  Service: Open Heart Surgery;  Laterality: Left;   RIGHT HEART CATH N/A 05/17/2020   Procedure: RIGHT HEART CATH;  Surgeon: Jolaine Artist, MD;  Location: [place] [facility];  Service: Cardiovascular;  Laterality: N/A;   RIGHT/LEFT HEART CATH AND CORONARY ANGIOGRAPHY N/A 05/17/2020   Procedure: RIGHT/LEFT HEART CATH AND CORONARY ANGIOGRAPHY;  Surgeon: Lorretta Harp, MD;  Location: [place] [facility];  Service: Cardiovascular;  Laterality: N/A;   TEE WITHOUT CARDIOVERSION N/A 05/18/2020   Procedure: TRANSESOPHAGEAL ECHOCARDIOGRAM (TEE);  Surgeon: Wonda Olds, MD;  Location: [place];  Service: Open Heart Surgery;  Laterality: N/A;   TONSILLECTOMY AND ADENOIDECTOMY  1952    Allergies  Allergies  Allergen Reactions   Tamsulosin Other (See Comments)    dizziness   Cipro [Ciprofloxacin Hcl] Swelling    Knee swelling, joint pain   Lipitor [Atorvastatin] Other (See Comments)    Leg Cramps   Lisinopril Cough     History of Present Illness    Eric Stokes is a 76 y.o. male who presents via audio/video conferencing for a telehealth visit today.  Pt was last seen in cardiology clinic on 09/26/2021 by Dr. Haroldine Laws. At that time DEVIAN BARTOLOMEI was doing well. The patient is now pending procedure as outlined above. Since his last visit, he has done well from a cardiac standpoint. He denies chest pain, palpitations, dyspnea, pnd, orthopnea, n, v, dizziness, syncope, edema, weight gain, or early satiety. All other systems reviewed and are otherwise negative except as noted above.   Home Medications    Prior to Admission medications   Medication Sig Start Date End Date Taking? Authorizing Provider  albuterol (VENTOLIN HFA) 108 (90 Base) MCG/ACT inhaler Inhale 1-2 puffs into the lungs 4 (four) times daily as needed. 09/26/15   [provider]  alfuzosin (UROXATRAL) 10 MG 24 hr tablet Take 10 mg by mouth daily with breakfast.    [provider]  aspirin 81 MG EC tablet Take 81 mg by  mouth daily. Swallow whole.    [provider]  B Complex Vitamins (VITAMIN B COMPLEX PO) Take 1 tablet by mouth daily.    [provider]  carboxymethylcellulose (REFRESH PLUS) 0.5 % SOLN Place 1 drop into both eyes 2 (two) times daily as needed (dry eyes).    [provider]  cetirizine (ZYRTEC) 10 MG tablet Take 10 mg by mouth daily. 01/16/13   [provider]  Cholecalciferol (VITAMIN D3) 2000 UNITS TABS Take 2,000 Int'l Units by mouth daily.     [provider]  dutasteride (AVODART) 0.5 MG capsule Take 0.5 mg by mouth daily.    [provider]  eplerenone (INSPRA) 25 MG tablet Take 1 tablet (25 mg total) by mouth daily. 02/25/21   Bensimhon, Shaune Pascal, MD  fluticasone (FLONASE) 50 MCG/ACT nasal spray USE TWO SPRAYS IN EACH NOSTRIL DAILY AS NEEDED. 05/02/20   Leamon Arnt, MD  ibuprofen (ADVIL) 400 MG tablet Take 400 mg by mouth at bedtime.     [provider]  ketotifen (ZADITOR) 0.025 % ophthalmic solution Apply 1 drop to eye as needed. 12/12/20   [provider]  latanoprost (XALATAN) 0.005 % ophthalmic solution INSTILL 1 DROP INTO BOTH EYES AT BEDTIME 08/30/20   Leamon Arnt, MD  montelukast (SINGULAIR) 10 MG tablet TAKE 1 TABLET BY MOUTH EVERY DAY AT BEDTIME 05/27/20   Leamon Arnt, MD  NON FORMULARY Cpap at night    [provider]  rosuvastatin (CRESTOR) 20 MG tablet Take 1 tablet (20 mg total) by mouth daily. 07/31/20   Bensimhon, Shaune Pascal, MD  sacubitril-valsartan (ENTRESTO) 24-26 MG Take 1 tablet by mouth 2 (two) times daily. 06/12/21   Bensimhon, Shaune Pascal, MD  sildenafil (VIAGRA) 100 MG tablet Take 0.5 tablets (50 mg total) by mouth daily as needed for erectile dysfunction. 10/18/20   Bensimhon, Shaune Pascal, MD    Physical Exam    Vital Signs:  ANTWOIN LACKEY does not have vital signs available for review today.  Given telephonic nature of communication, physical exam is limited. AAOx3. NAD. Normal affect.  Speech and respirations are unlabored.  Accessory Clinical Findings    None  Assessment & Plan    1.  Preoperative Cardiovascular Risk Assessment:  According to the Revised Cardiac Risk Index (RCRI), his Perioperative Risk of Major Cardiac Event is (%): 6.6. His Functional Capacity in METs is: 7.99 according to the Duke Activity Status Index (DASI). Therefore, based on ACC/AHA guidelines, patient would be at acceptable risk for the planned procedure without further cardiovascular testing.  Per office protocol, he may hold aspirin for 5-7 days prior to surgery. Please resume aspirin as soon as possible postprocedure, at the discretion of the surgeon.   A copy of this note will be routed to requesting surgeon.  Time:   Today, I have spent 7 minutes with the patient with telehealth technology discussing medical history, symptoms, and management plan.     Lenna Sciara,  NP  04/03/2022, 10:52 AM

## 2022-04-14 NOTE — Anesthesia Preprocedure Evaluation (Signed)
Anesthesia Evaluation  Patient identified by MRN, date of birth, ID band Patient awake    Reviewed: Allergy & Precautions, H&P , NPO status , Patient's Chart, lab work & pertinent test results  Airway Mallampati: II   Neck ROM: full    Dental  (+) Poor Dentition, Dental Advisory Given, Missing   Pulmonary asthma , sleep apnea , COPD, former smoker,    breath sounds clear to auscultation       Cardiovascular hypertension, Pt. on medications + CAD, + Past MI and +CHF  + Valvular Problems/Murmurs AS  Rhythm:Irregular Rate:Normal + Systolic murmurs Nuclear stress study [facility] 09-17-2021 There are no defects seen on stress or rest images to suggest ischemia or infarct.   Ejection fraction is calculated at 54%.   Impression: 1. No evidence of inducible ischemia.    Echo 2021 1. Septal hypokinesis and dyskinesis. Basal to mid inferior hypokinesis.. Left ventricular ejection fraction, by estimation, is 40 to 45%. The left ventricle has mildly decreased function. The left ventricle demonstrates  regional wall motion abnormalities (see scoring diagram/findings for description). There is moderate concentric left ventricular hypertrophy. Left ventricular  diastolic parameters are consistent with Grade II diastolic dysfunction (pseudonormalization).  2. Trivial mitral valve regurgitation.  3. The aortic valve is calcified. There is moderate calcification of the aortic valve. There is moderate thickening of the aortic valve. Aortic valve regurgitation is mild. Mild aortic valve stenosis.  4. Aortic dilatation noted. There is mild dilatation of the ascending aorta, measuring 40 mm.  5. There is normal pulmonary artery systolic pressure.    Neuro/Psych TIA   GI/Hepatic   Endo/Other    Renal/GU      Musculoskeletal  (+) Arthritis ,   Abdominal (+) + obese,   Peds  Hematology   Anesthesia Other Findings    Reproductive/Obstetrics                          Anesthesia Physical  Anesthesia Plan  ASA: 4  Anesthesia Plan: General   Post-op Pain Management: Tylenol PO (pre-op)* and Minimal or no pain anticipated   Induction: Intravenous  PONV Risk Score and Plan: 3 and Ondansetron, Dexamethasone, Midazolam and Treatment may vary due to age or medical condition  Airway Management Planned: LMA  Additional Equipment:   Intra-op Plan:   Post-operative Plan: Extubation in OR  Informed Consent: I have reviewed the patients History and Physical, chart, labs and discussed the procedure including the risks, benefits and alternatives for the proposed anesthesia with the patient or authorized representative who has indicated his/her understanding and acceptance.     Dental advisory given  Plan Discussed with: CRNA  Anesthesia Plan Comments:      Anesthesia Quick Evaluation

## 2022-04-14 NOTE — H&P (Signed)
Office Visit Report     03/31/2022   --------------------------------------------------------------------------------   Eric Stokes  MRN: [id number]  DOB: 09-26-1945, 76 year old Male  SSN: [id number]   PRIMARY CARE:  Briscoe Deutscher, DO  REFERRING:  Georgette Dover, MD  PROVIDER:  Festus Aloe, M.D.  LOCATION:  [facility] [phone]     --------------------------------------------------------------------------------   CC/HPI: F/u -   1) BPH - 02/16/2012. On alfuzosin and on/off 5ari. Prostate 106 g and down to 76 g on CT 06/22 and 79 g on pMRI 12/22. No surgery. AUASS was 12 in 2019. Not bothered. Weak stream and frequency. He was rx'd flomax but didn't want to take meds - continued surveillance.   He was seen July 2020 with frequency and dysuria. Urine culture grew Staph and he was treated with doxycycline. He started tamsulosin. His postvoid was up to 667 and a catheter was placed. He passed a voiding trial. He had severe dizziness and change to alfuzosin but even then cut it to 5 mg BID. Prostate ultrasound was recommended the guide therapy but he did not follow up. He tried to stop alfuzosin 5 mg BID but had a slow stream. Another cx grew Serratia and Staph sensitive to Bactrim and Cipro. He has frequency and urgency. UA with MH and wbc. PVR better at 124 cc.   He underwent CT 08/23/2019 with a 95 gram prostate with multiple abnormal, enlarged bilateral pelvic sidewall, iliac, and retroperitoneal lymph nodes, largest right pelvic sidewall lymph node measuring 1.5 x 1.3 cm (series 5, image 74, 77, 57). and benign. PSA was 8.64. Cysto benign - good Urolift or Rezum candidate. Prostate 106 g on TRUS 10/2019.   F/u PVR 162 ml. He had some mild dysuria penile irritation. UA 12/23/2019 and cx negative. UA clear. Started finasteride 05/21 with [facility] and they considered TURP. His PVR decreased to 87 ml. Pt has considered it and wants to continue finasteride and  alfuzosin BID and voiding better. He would consider but like to avoid BPH tx if possible. PVR 01/22, 115 ml. UA clear. He's lost 25 lbs since surgery (CABG) 10/21.   He's on alfuzosin now QD. He also takes finasteride, but stopped finasteride early 03/22. CT 06/22 with prostate 76 g. Breast tenderness better. No dysuria. Cysto 07/22 with long prostate channel (6-6.5 cm), no significant median lobe on cystoscopy. I kept him on nightly TMP x 45 days for bacteriuria causing some dysuria from 07/22 x 6 weeks. Dysuria and frequency returned. His PCP stopped jardiance. Stopped spironolactone. Asked about 5ari restart and rx dutasteride Oct 2022 which he started. Prostate was 79 g on MRI 12/22. He has some occasional dysuria.   Remains on dutasteride. AUASS = 14.    2) Low risk PCa - second opinion and management at [facility] and [facility].   Biopsy:  #17 Oct 2019 - Low risk:  PSA 8.64  T1c  Prostate 106 grams  Gleason 3+3=6, one core, 30%, right apex   #17 Jan 2022  PSA 2.29 (4.6)  T1c  Prostate 83 g (67-99 g +/- median lobe)  GG2 in three cores, left base (5%)  GG2 right anterior apical ROI (80-90%)   Staging:  CT 08/23/2019 with a 95 gram prostate with multiple abnormal, enlarged bilateral pelvic sidewall, iliac, and retroperitoneal lymph nodes, largest right pelvic sidewall lymph node measuring 1.5 x 1.3 cm (series 5, image 74, 77, 57). and benign.  CT 06/22 - no LAD (likely above  LAD reactive), no bone lesions - prostate 76 g after 5ari  Dec 2021 - pMRI - [place] - reported as normal and prostate 70 g  Dec 2022 pMRI - a PIRADs 3 lesion right ant PZ, negative staging (pelvic lymph nodes noted to be 0.8 cm). Prostate 79 grams.   He went to [facility] clinic - CT reported as decreased LAD. They wanted to continue AS for a while and see him back at [facility] [place]. Started finasteride and considered TURP prior to Tx 05/21. PSA now 2.6 (5.2) on finasteride. He has considered it and wants to  continue AS for as long as possible. Otherwise he would consider BPH tx (if still needed) and then XRT.   PSA down to 1.5 (3) 01/22 on 5ari. He had CABG 10/21. Still sore and some breast tenderness (5ari vs sternotomy?). He reports 12/21 MRI showed "no imperfections" and prostate down to 70 grams. They will continue AS. He stopped 5ari earl 03/22 with PSA 2.13 (4.2). The breast tenderness settled down and testicles sore. However, he stopped spironolactone and breast tenderness improved. Asked about restart 5ari.   07/22 PSA 3.45 - off 5ari. His 09/22 PSA remains normal at 3.17 p abx. His was rx 5ari 10/22 and psa 2.6 12/22 and 2.29 (4.6) in Apr 2023.    3) ED - noticed trouble getting and maintaining erection since gradually since 2021. Good libido. In cardiac rehab and will ask cardiologist about pde5i. His cardiologist put him on sildenafil 100 mg which he can cut into 1/2s or 1/4s. His 07/22 T was 480.    4) gross hematuria - pt developed red/cranberry urine 05/22. No fever or dysuria. Intermittent blood this well. No clots. UA with many bacteria. Cx returned p he left + for enterobacter. Abx sent. 01/21 CT and cysto benign w/u for BPH.   Repeat CT 06/22 with BPH, bladder tic and BWT. LAD decreased (reactive). Prostate 76 g. Cysto 07/22 benign with cloudy urine. Last cx enterobacter and we think a chronic cystitis might be contributing to his LUTS and hematuria.    He returns in management of the above for management of BPH and prostate cancer. He is on diuretics. His cardiologist is Dr. Burt Knack. He has frequency. Stream is adequate.     ALLERGIES: Cipro - knee swelling, joint pain finasteride - breast tenderness Lipitor TABS Lisinopril TABS Tamsulosin - Dizziness (Severe)    MEDICATIONS: Albuterol Sulfate Hfa 90 mcg hfa aerosol with adapter  Alfuzosin Hcl Er 10 mg tablet, extended release 24 hr  Aspirin 81 MG TABS Oral  Cetirizine Hcl 10 mg tablet  Cpap  Diazepam 5 mg tablet 3 tablet  PO once take three tablets about 30 minutes prior to procedure  Dutasteride 0.5 mg capsule 1 capsule PO Daily  Eplerenone 25 mg tablet  Fluticasone Propionate 50 mcg/actuation spray, suspension  Furosemide 80 mg tablet  Ketotifen Fumarate 0.025 % (0.035 %) drops  Latanoprost 0.005 % drops  Montelukast Sodium 10 mg tablet  Rosuvastatin Calcium 20 mg tablet  Sacubitril/Valsartan  Sildenafil Citrate 100 mg tablet     GU PSH: Cystoscopy - 02/22/2021, 2021 Locm 300-'399Mg'$ /Ml Iodine,1Ml - 01/17/2021, 2021 Prostate Needle Biopsy - 01/17/2022, 2021       PSH Notes: Cholecystectomy   NON-GU PSH: CABG (coronary artery bypass grafting) Cholecystectomy (open) - 2013 Surgical Pathology, Gross And Microscopic Examination For Prostate Needle - 01/17/2022, 2021 Total Knee Replacement, Right     GU PMH: BPH w/LUTS, We discussed the  nature r/b of surveillance, alpha blocker, 5ari, daily pdei, OAB meds and procedures such as TURP, LVP, LEP, RWJ, RSLP in the OR or PUL or WVT in the office. We will plan to set him up for aqua ablation and if not available for thulium laser vaporization of the prostate possible TURP. - 01/23/2022, Disc meds and procedures. , - 12/12/2021, he wants to continue alf and dutasteride and "shrink" the prostate. Urine for cx. , - 08/23/2021, He asked about 5ari and would like to take a med prior to doing any procedures. Disc again nature r/b/a to 5ari and FDA warnings. Dutasteride rx sent. Can use finasteride. , - 05/23/2021, - 02/22/2021, disc again nature r/b/a to 5ari and FDA warnings. , - 01/04/2021, He is considering a prostate procedure. , - 11/15/2020, cont alf and finast , - 2022, Disc again nature r/b of alpha blocker and 5ari again. Will cont med therapy. , - 04/26/2020, will start finasteride. , - 2021, We also discussed all manner of BPH tx -- meds, procedures and went over the anatomy, nature r/b of each. He will consider and review with family. I worte these down in the PCa book. He has  some frequency. , - 2021, - 2021, pvr better on alfuzosin 5 bid - continue. Consider 5ari or procedure if needed. , - 2021, - 2020, - 2020, - 2020, - 2020, - 2019, - 2018, Benign localized prostatic hyperplasia with lower urinary tract symptoms (LUTS), - 2017 Incomplete bladder emptying - 01/23/2022, - 12/12/2021, - 08/23/2021, - 05/23/2021, - 02/22/2021, - 01/04/2021, - 2022, - 2020 Prostate Cancer, I had a long discussion with the patient using the understanding prostate cancer booklet and his path report as a reference. We discussed his stage, grade and prognosis. We discussed the nature risks and benefits of active surveillance, radical prostatectomy, external beam radiotherapy, and brachytherapy. We discussed the role of androgen deprivation and chemotherapy in prostate cancer. We also discussed other ablative techniques such as HiFU and cryotherapy as well as whole gland versus focal treatment. We discussed specifically how each treatment might affect bowel, bladder and sexual function. We discussed how each treatment might effect salvage treatments and active surveillance might lead to progression and more difficult treatment in the future. All questions answered. He has had progression of his grade to a slight degree as well as an increased percentage of cores. He is interested in radiation either external beam or brachy. Once we have dealt with the BPH we will reassess his PSA and then get him over to radiation oncology. - 01/23/2022, Tolerated well. 14 bx done. DRE nl. Prostate 83 g (67-99 g +/- median lobe), - 01/17/2022, We discussed the MRI and PSA levels. Discussed nature r/b/a to TRUS surveillance bx and he would like to proceed. He prefers the "pain of a biopsy" vs the "mental" anguish of AS. Once we get scheduled I'll send in diazepam and abx to [facility]. , - 12/12/2021, Disc MRI. Stable. Disc biopsy of prostate. He really favors AS. He would be interested in brachy if we can "shrink" the prostate. He felt  the biopsy was "invasive" and wants to avoid. , - 08/23/2021, PSA remains low -, - 05/23/2021, PSA up off 5ari. CT with resolution of LAD. , - 02/22/2021, Check T, E and PSA off 5ari in 3 mo. We discussed the nature r/b/ of MRI vs bx. , - 11/15/2020, breast tenderness - consider serm, aromatase inhibitor . Check PSA, T and E next time. Also, pt to  go by and get MRI report from [place]., - 2022, MRI pending. Discussed again AS vs tx with XRT. He would like to continue AS for as long as possible. , - 04/26/2020, See back in 3 mo with a PSA , - 2021, I had a long discussion with the patient using the understanding prostate cancer booklet and his path report as a reference. We discussed his stage, grade and prognosis. We discussed the nature risks and benefits of active surveillance, radical prostatectomy, external beam radiotherapy, and brachytherapy. We discussed the role of androgen deprivation and chemotherapy in prostate cancer. We also discussed other ablative techniques such as HiFU and cryotherapy as well as whole gland versus focal treatment. We discussed specifically how each treatment might affect bowel, bladder and sexual function. We discussed how each treatment might effect salvage treatments and active surveillance might lead to progression and more difficult treatment in the future. All questions answered. Will continue surveillance of PSA and consider further CT in future (LAD) and possibly a PET scan. We talked about the LAD doesn't correlate with what we are seeing with the PSAD and Gleason grade. , - 2021 Urinary Frequency, urine for cx = consider another round of tmp - 05/23/2021, - 04/26/2020, - 2018 Gross hematuria, I wonder if a chronic cystitis is contributing - I gave bactrim BID today and will start nightly TMP. - 02/22/2021, - 01/17/2021, sent Bun and Cr. Check CT scan and cystoscopy. Urine cx pending. , - 01/04/2021 ED due to arterial insufficiency, DIscussed again nature r/b/a to pde5i and sildenafil dosing.  - 11/15/2020, Labs as above. Disc nature r/b/a to pde5i. , - 2022 Nocturia - 11/15/2020, Nocturia, - 2017 Microscopic hematuria - 2020 Urinary Retention - 2020 Elevated PSA - 2018, Elevated prostate specific antigen (PSA), - 2015 Urinary Tract Inf, Unspec site, Urinary tract infection - 2016    NON-GU PMH: Localized enlarged lymph nodes (Stable), CT will recheck the nodes. Pt reports [facility] had done this but now "canceled all his appts". - 01/04/2021, as above. , - 2021 Encounter for general adult medical examination without abnormal findings, Encounter for preventive health examination - 2017 Personal history of other diseases of the circulatory system, History of hypertension - 2014 Personal history of other endocrine, nutritional and metabolic disease, History of hypercholesterolemia - 2014    FAMILY HISTORY: Father Deceased At Age79 ___ - [place] In Family Mother Deceased At Age 26 from diabetic complicati - [place] In Family No pertinent family history - Other   SOCIAL HISTORY: Marital Status: Married Preferred Language: English; Ethnicity: Not Hispanic Or Latino; Race: White Current Smoking Status: Patient does not smoke anymore.   Tobacco Use Assessment Completed: Used Tobacco in last 30 days? Light Drinker.  Drinks 2 caffeinated drinks per day.     Notes: Former smoker, Tobacco use, Alcohol Use, Marital History - Currently Married, Caffeine Use   REVIEW OF SYSTEMS:    GU Review Male:   Patient reports frequent urination, hard to postpone urination, and get up at night to urinate. Patient denies burning/ pain with urination, leakage of urine, stream starts and stops, trouble starting your stream, have to strain to urinate , erection problems, and penile pain.  Gastrointestinal (Upper):   Patient denies nausea, vomiting, and indigestion/ heartburn.  Gastrointestinal (Lower):   Patient denies diarrhea and constipation.  Constitutional:   Patient denies fever, night sweats, weight loss, and  fatigue.  Skin:   Patient denies skin rash/ lesion and itching.  Eyes:   Patient denies  blurred vision and double vision.  Ears/ Nose/ Throat:   Patient denies sore throat and sinus problems.  Hematologic/Lymphatic:   Patient denies swollen glands and easy bruising.  Cardiovascular:   Patient denies leg swelling and chest pains.  Respiratory:   Patient denies cough and shortness of breath.  Endocrine:   Patient denies excessive thirst.  Musculoskeletal:   Patient reports back pain. Patient denies joint pain.  Neurological:   Patient reports dizziness. Patient denies headaches.  Psychologic:   Patient denies depression and anxiety.   VITAL SIGNS: None   MULTI-SYSTEM PHYSICAL EXAMINATION:    Constitutional: Well-nourished. No physical deformities. Normally developed. Good grooming.  Neck: Neck symmetrical, not swollen. Normal tracheal position.  Respiratory: No labored breathing, no use of accessory muscles.   Cardiovascular: Normal temperature, normal extremity pulses, no swelling, no varicosities.  Skin: No paleness, no jaundice, no cyanosis. No lesion, no ulcer, no rash.  Neurologic / Psychiatric: Oriented to time, oriented to place, oriented to person. No depression, no anxiety, no agitation.  Gastrointestinal: No mass, no tenderness, no rigidity, non obese abdomen.     Complexity of Data:  Lab Test Review:   Path Report   12/05/21 08/16/21 05/16/21 02/15/21 11/15/20 08/23/20 04/17/20 09/08/19  PSA  Total PSA 2.29 ng/mL 2.64 ng/mL 3.17 ng/mL 3.45 ng/mL 2.13 ng/mL 1.51 ng/mL 2.60 ng/mL 8.64 ng/mL  Free PSA        0.93 ng/mL  % Free PSA        11 % PSA    02/15/21  Hormones  Testosterone, Total 479.9 ng/dL    PROCEDURES:          Urinalysis w/Scope Dipstick Dipstick Cont'd Micro  Color: Yellow Bilirubin: Neg mg/dL WBC/hpf: NS (Not Seen)  Appearance: Clear Ketones: Neg mg/dL RBC/hpf: 0 - 2/hpf  Specific Gravity: 1.015 Blood: Trace ery/uL Bacteria: NS (Not Seen)  pH: 6.0  Protein: Neg mg/dL Cystals: NS (Not Seen)  Glucose: Neg mg/dL Urobilinogen: 0.2 mg/dL Casts: NS (Not Seen)    Nitrites: Neg Trichomonas: Not Present    Leukocyte Esterase: Neg leu/uL Mucous: Not Present      Epithelial Cells: NS (Not Seen)      Yeast: NS (Not Seen)      Sperm: Not Present    ASSESSMENT:      ICD-10 Details  1 GU:   BPH w/LUTS - N40.1 Chronic, Stable - Patient is here with his daughter that works over at [employer] and is a Designer, jewellery in Nutritional therapist.   As far as BPH we went over the nature risk benefits and alternatives to thulium laser vaporization of the prostate. Given his cardiac history I do think there is a lower risk of bleeding with the laser although we discussed sometimes we have to convert to TURP. We will proceed with the laser. We also discussed extensively the timing of a BPH procedure versus radiation and vice versa.   I discussed with the patient the nature r/b/a to laser vaporization of prostate including side effects of the procedure, expected post-op course and likelihood of success. We discussed flow symptoms and irritative symptoms typically improve, but frequency and urgency can persist and rarely worsen. We also discussed risk of bleeding, infection, stricture, sexual dysfunction and incontinence among others. We also discussed expectations for a staged or repeat procedure in the future. All questions answered. He elects to proceed.   Given his cardiac history we will go ahead and get cardiac clearance as well.  2   Incomplete bladder  emptying - R39.14 Chronic, Stable - UA sent for urinalysis and culture.  3   Prostate Cancer - C61 Chronic, Stable - Given that his daughter is here for the first appointment we went over again his PSA levels, his grade stage and prognosis of both of his biopsies as well as his MRI results. She had the pathology printed. We went over the nature risk benefits and alternatives to radiation. We discussed again continued active  surveillance versus surgery for the prostate. All questions answered. I will refer him to Dr. Tammi Klippel for consultation.   PLAN:           Orders Labs Urine Culture, Urine Culture          Schedule Return Visit/Planned Activity: Keep Scheduled Appointment - Extender, Follow up MD          Document Letter(s):  Created for Patient: Clinical Summary         Notes:   cc: Dr. Juleen China         Next Appointment:      Next Appointment: 04/15/2022 07:30 AM    Appointment Type: Surgery     Location: [facility] [phone]    Provider: Festus Aloe, M.D.    Reason for Visit: OP NE THULIUM LASER OF PROSTATE      * Signed by Festus Aloe, M.D. on 03/31/22 at 6:06 PM (EDT)*      The information contained in this medical record document is considered private and confidential patient information. This information can only be used for the medical diagnosis and/or medical services that are being provided by the patient's selected caregivers. This information can only be distributed outside of the patient's care if the patient agrees and signs waivers of authorization for this information to be sent to an outside source or route.

## 2022-04-15 ENCOUNTER — Ambulatory Visit (HOSPITAL_BASED_OUTPATIENT_CLINIC_OR_DEPARTMENT_OTHER): Payer: No Typology Code available for payment source | Admitting: Anesthesiology

## 2022-04-15 ENCOUNTER — Encounter (HOSPITAL_BASED_OUTPATIENT_CLINIC_OR_DEPARTMENT_OTHER)
Admission: RE | Disposition: A | Discharge status: Home / Self Care | Payer: Self-pay | Source: Home / Self Care | Attending: Urology

## 2022-04-15 ENCOUNTER — Other Ambulatory Visit: Payer: Self-pay

## 2022-04-15 ENCOUNTER — Encounter (HOSPITAL_BASED_OUTPATIENT_CLINIC_OR_DEPARTMENT_OTHER): Payer: Self-pay | Admitting: Urology

## 2022-04-15 ENCOUNTER — Ambulatory Visit (HOSPITAL_BASED_OUTPATIENT_CLINIC_OR_DEPARTMENT_OTHER)
Admission: RE | Admit: 2022-04-15 | Discharge: 2022-04-15 | Disposition: A | Discharge status: Home / Self Care | Payer: No Typology Code available for payment source | Attending: Urology | Admitting: Urology

## 2022-04-15 DIAGNOSIS — I252 Old myocardial infarction: Secondary | ICD-10-CM | POA: Insufficient documentation

## 2022-04-15 DIAGNOSIS — I110 Hypertensive heart disease with heart failure: Secondary | ICD-10-CM | POA: Diagnosis not present

## 2022-04-15 DIAGNOSIS — J449 Chronic obstructive pulmonary disease, unspecified: Secondary | ICD-10-CM | POA: Diagnosis not present

## 2022-04-15 DIAGNOSIS — E669 Obesity, unspecified: Secondary | ICD-10-CM | POA: Insufficient documentation

## 2022-04-15 DIAGNOSIS — I2510 Atherosclerotic heart disease of native coronary artery without angina pectoris: Secondary | ICD-10-CM | POA: Diagnosis not present

## 2022-04-15 DIAGNOSIS — N401 Enlarged prostate with lower urinary tract symptoms: Secondary | ICD-10-CM | POA: Insufficient documentation

## 2022-04-15 DIAGNOSIS — Z6833 Body mass index (BMI) 33.0-33.9, adult: Secondary | ICD-10-CM | POA: Insufficient documentation

## 2022-04-15 DIAGNOSIS — Z01818 Encounter for other preprocedural examination: Secondary | ICD-10-CM

## 2022-04-15 DIAGNOSIS — Z87891 Personal history of nicotine dependence: Secondary | ICD-10-CM

## 2022-04-15 DIAGNOSIS — I509 Heart failure, unspecified: Secondary | ICD-10-CM

## 2022-04-15 DIAGNOSIS — C61 Malignant neoplasm of prostate: Secondary | ICD-10-CM | POA: Diagnosis not present

## 2022-04-15 DIAGNOSIS — N138 Other obstructive and reflux uropathy: Secondary | ICD-10-CM

## 2022-04-15 DIAGNOSIS — I350 Nonrheumatic aortic (valve) stenosis: Secondary | ICD-10-CM | POA: Diagnosis not present

## 2022-04-15 DIAGNOSIS — G4730 Sleep apnea, unspecified: Secondary | ICD-10-CM | POA: Diagnosis not present

## 2022-04-15 HISTORY — DX: Other obstructive and reflux uropathy: N13.8

## 2022-04-15 HISTORY — DX: Presence of external hearing-aid: Z97.4

## 2022-04-15 HISTORY — DX: Personal history of other malignant neoplasm of skin: Z85.828

## 2022-04-15 HISTORY — PX: TRANSURETHRAL RESECTION OF PROSTATE: SHX73

## 2022-04-15 HISTORY — PX: THULIUM LASER TURP (TRANSURETHRAL RESECTION OF PROSTATE): SHX6744

## 2022-04-15 HISTORY — DX: Unspecified glaucoma: H40.9

## 2022-04-15 HISTORY — DX: Other seasonal allergic rhinitis: J30.2

## 2022-04-15 HISTORY — DX: Benign prostatic hyperplasia with lower urinary tract symptoms: N40.1

## 2022-04-15 HISTORY — DX: Paroxysmal atrial fibrillation: I48.0

## 2022-04-15 LAB — POCT I-STAT, CHEM 8
BUN: 16 mg/dL (ref 8–23)
Calcium, Ion: 1.17 mmol/L (ref 1.15–1.40)
Chloride: 105 mmol/L (ref 98–111)
Creatinine, Ser: 0.9 mg/dL (ref 0.61–1.24)
Glucose, Bld: 106 mg/dL — ABNORMAL HIGH (ref 70–99)
HCT: 39 % (ref 39.0–52.0)
Hemoglobin: 13.3 g/dL (ref 13.0–17.0)
Potassium: 3.8 mmol/L (ref 3.5–5.1)
Sodium: 141 mmol/L (ref 135–145)
TCO2: 22 mmol/L (ref 22–32)

## 2022-04-15 SURGERY — THULIUM LASER TURP (TRANSURETHRAL RESECTION OF PROSTATE)
Anesthesia: General | Site: Prostate

## 2022-04-15 MED ORDER — EPHEDRINE 5 MG/ML INJ
INTRAVENOUS | Status: AC
  Filled 2022-04-15: qty 5

## 2022-04-15 MED ORDER — EPHEDRINE SULFATE-NACL 50-0.9 MG/10ML-% IV SOSY
PREFILLED_SYRINGE | INTRAVENOUS | Status: DC | PRN
  Administered 2022-04-15: 2.5 mg via INTRAVENOUS
  Administered 2022-04-15: 5 mg via INTRAVENOUS

## 2022-04-15 MED ORDER — DEXAMETHASONE SODIUM PHOSPHATE 10 MG/ML IJ SOLN
INTRAMUSCULAR | Status: AC
  Filled 2022-04-15: qty 1

## 2022-04-15 MED ORDER — FENTANYL CITRATE (PF) 100 MCG/2ML IJ SOLN
INTRAMUSCULAR | Status: AC
  Filled 2022-04-15: qty 2

## 2022-04-15 MED ORDER — FENTANYL CITRATE (PF) 100 MCG/2ML IJ SOLN
INTRAMUSCULAR | Status: DC | PRN
  Administered 2022-04-15 (×6): 50 ug via INTRAVENOUS

## 2022-04-15 MED ORDER — ACETAMINOPHEN 500 MG PO TABS
1000.0000 mg | ORAL_TABLET | Freq: Once | ORAL | Status: AC
Start: 2022-04-15 — End: 2022-04-15
  Administered 2022-04-15: 1000 mg via ORAL

## 2022-04-15 MED ORDER — PHENYLEPHRINE 80 MCG/ML (10ML) SYRINGE FOR IV PUSH (FOR BLOOD PRESSURE SUPPORT)
PREFILLED_SYRINGE | INTRAVENOUS | Status: AC
  Filled 2022-04-15: qty 20

## 2022-04-15 MED ORDER — PHENYLEPHRINE HCL (PRESSORS) 10 MG/ML IV SOLN
INTRAVENOUS | Status: AC
  Filled 2022-04-15: qty 1

## 2022-04-15 MED ORDER — ARTIFICIAL TEARS OPHTHALMIC OINT
TOPICAL_OINTMENT | OPHTHALMIC | Status: AC
  Filled 2022-04-15: qty 3.5

## 2022-04-15 MED ORDER — CEFAZOLIN SODIUM-DEXTROSE 2-4 GM/100ML-% IV SOLN
2.0000 g | INTRAVENOUS | Status: AC
  Administered 2022-04-15: 2 g via INTRAVENOUS

## 2022-04-15 MED ORDER — SODIUM CHLORIDE 0.9 % IR SOLN
Status: DC | PRN
  Administered 2022-04-15 (×2): 3000 mL via INTRAVESICAL
  Administered 2022-04-15: 6000 mL via INTRAVESICAL
  Administered 2022-04-15 (×2): 3000 mL via INTRAVESICAL
  Administered 2022-04-15: 6000 mL via INTRAVESICAL
  Administered 2022-04-15 (×2): 3000 mL via INTRAVESICAL

## 2022-04-15 MED ORDER — ACETAMINOPHEN 500 MG PO TABS
ORAL_TABLET | ORAL | Status: AC
  Filled 2022-04-15: qty 2

## 2022-04-15 MED ORDER — LIDOCAINE 2% (20 MG/ML) 5 ML SYRINGE
INTRAMUSCULAR | Status: DC | PRN
  Administered 2022-04-15: 100 mg via INTRAVENOUS

## 2022-04-15 MED ORDER — ONDANSETRON HCL 4 MG/2ML IJ SOLN
INTRAMUSCULAR | Status: AC
  Filled 2022-04-15: qty 2

## 2022-04-15 MED ORDER — ONDANSETRON HCL 4 MG/2ML IJ SOLN
INTRAMUSCULAR | Status: DC | PRN
  Administered 2022-04-15: 4 mg via INTRAVENOUS

## 2022-04-15 MED ORDER — FENTANYL CITRATE (PF) 100 MCG/2ML IJ SOLN
25.0000 ug | INTRAMUSCULAR | Status: DC | PRN
  Administered 2022-04-15: 25 ug via INTRAVENOUS

## 2022-04-15 MED ORDER — PROPOFOL 10 MG/ML IV BOLUS
INTRAVENOUS | Status: AC
  Filled 2022-04-15: qty 20

## 2022-04-15 MED ORDER — DEXAMETHASONE SODIUM PHOSPHATE 10 MG/ML IJ SOLN
INTRAMUSCULAR | Status: DC | PRN
  Administered 2022-04-15: 5 mg via INTRAVENOUS

## 2022-04-15 MED ORDER — LACTATED RINGERS IV SOLN: INTRAVENOUS | Status: DC

## 2022-04-15 MED ORDER — PHENYLEPHRINE HCL (PRESSORS) 10 MG/ML IV SOLN
INTRAVENOUS | Status: DC | PRN
  Administered 2022-04-15 (×3): 80 ug via INTRAVENOUS
  Administered 2022-04-15: 160 ug via INTRAVENOUS

## 2022-04-15 MED ORDER — PHENYLEPHRINE HCL-NACL 20-0.9 MG/250ML-% IV SOLN
INTRAVENOUS | Status: DC | PRN
  Administered 2022-04-15: 80 ug/min via INTRAVENOUS

## 2022-04-15 MED ORDER — PROPOFOL 10 MG/ML IV BOLUS
INTRAVENOUS | Status: DC | PRN
  Administered 2022-04-15: 150 mg via INTRAVENOUS

## 2022-04-15 MED ORDER — CEFAZOLIN SODIUM-DEXTROSE 2-4 GM/100ML-% IV SOLN
INTRAVENOUS | Status: AC
  Filled 2022-04-15: qty 100

## 2022-04-15 MED ORDER — AMISULPRIDE (ANTIEMETIC) 5 MG/2ML IV SOLN: 10.0000 mg | Freq: Once | INTRAVENOUS | Status: DC | PRN

## 2022-04-15 SURGICAL SUPPLY — 35 items
BAG DRAIN URO-CYSTO SKYTR STRL (DRAIN) ×2 IMPLANT
BAG DRN RND TRDRP ANRFLXCHMBR (UROLOGICAL SUPPLIES) ×1
BAG DRN UROCATH (DRAIN) ×1
BAG URINE DRAIN 2000ML AR STRL (UROLOGICAL SUPPLIES) ×2 IMPLANT
CATH COUDE FOLEY 2W 5CC 18FR (CATHETERS) IMPLANT
CATH FOLEY 2WAY SLVR  5CC 18FR (CATHETERS)
CATH FOLEY 2WAY SLVR  5CC 20FR (CATHETERS) ×1
CATH FOLEY 2WAY SLVR 5CC 18FR (CATHETERS) IMPLANT
CATH FOLEY 2WAY SLVR 5CC 20FR (CATHETERS) IMPLANT
CATH FOLEY 3WAY 30CC 22FR (CATHETERS) IMPLANT
CLOTH BEACON ORANGE TIMEOUT ST (SAFETY) ×2 IMPLANT
ELECT BIVAP BIPO 22/24 DONUT (ELECTROSURGICAL)
ELECTRD BIVAP BIPO 22/24 DONUT (ELECTROSURGICAL) IMPLANT
GLOVE BIO SURGEON STRL SZ7.5 (GLOVE) ×2 IMPLANT
GLOVE BIO SURGEON STRL SZ8 (GLOVE) IMPLANT
GLOVE BIOGEL PI IND STRL 8 (GLOVE) IMPLANT
GLOVE BIOGEL PI INDICATOR 8 (GLOVE) ×1
GOWN STRL REUS W/TWL LRG LVL3 (GOWN DISPOSABLE) ×1 IMPLANT
HOLDER FOLEY CATH W/STRAP (MISCELLANEOUS) IMPLANT
IV NS 1000ML (IV SOLUTION)
IV NS 1000ML BAXH (IV SOLUTION) ×2 IMPLANT
IV NS IRRIG 3000ML ARTHROMATIC (IV SOLUTION) ×1 IMPLANT
IV SET EXTENSION GRAVITY 40 LF (IV SETS) ×1 IMPLANT
KIT TURNOVER CYSTO (KITS) ×1 IMPLANT
LASER REVOLIX HI ENERGY 1000 (Laser) ×2 IMPLANT
LASER REVOLIX PROCEDURE (MISCELLANEOUS) ×1 IMPLANT
LOOP CUT BIPOLAR 24F LRG (ELECTROSURGICAL) IMPLANT
MANIFOLD NEPTUNE II (INSTRUMENTS) IMPLANT
PACK CYSTO (CUSTOM PROCEDURE TRAY) ×1 IMPLANT
SYR 30ML LL (SYRINGE) IMPLANT
SYR TOOMEY IRRIG 70ML (MISCELLANEOUS)
SYRINGE TOOMEY IRRIG 70ML (MISCELLANEOUS) IMPLANT
TUBE CONNECTING 12X1/4 (SUCTIONS) IMPLANT
TUBING UROLOGY SET (TUBING) IMPLANT
WATER STERILE IRR 500ML POUR (IV SOLUTION) IMPLANT

## 2022-04-15 NOTE — Discharge Instructions (Signed)
  Post Anesthesia Home Care Instructions  Activity: Get plenty of rest for the remainder of the day. A responsible individual must stay with you for 24 hours following the procedure.  For the next 24 hours, DO NOT: -Drive a car -Paediatric nurse -Drink alcoholic beverages -Take any medication unless instructed by your physician -Make any legal decisions or sign important papers.  Meals: Start with liquid foods such as gelatin or soup. Progress to regular foods as tolerated. Avoid greasy, spicy, heavy foods. If nausea and/or vomiting occur, drink only clear liquids until the nausea and/or vomiting subsides. Call your physician if vomiting continues.  Special Instructions/Symptoms: Your throat may feel dry or sore from the anesthesia or the breathing tube placed in your throat during surgery. If this causes discomfort, gargle with warm salt water. The discomfort should disappear within 24 hours.  May take Tylenol beginning at 41 Noon as needed for pain/discomfort.

## 2022-04-15 NOTE — Transfer of Care (Signed)
Immediate Anesthesia Transfer of Care Note  Patient: FERRY MATTHIS  Procedure(s) Performed: Marcelino Duster LASER VAPORIZATION OF PROSTATE (Prostate)  Patient Location: PACU  Anesthesia Type:General  Level of Consciousness: awake, alert , oriented and patient cooperative  Airway & Oxygen Therapy: Patient Spontanous Breathing  Post-op Assessment: Report given to RN and Post -op Vital signs reviewed and stable  Post vital signs: Reviewed and stable  Last Vitals:  Vitals Value Taken Time  BP 121/81 04/15/22 0932  Temp    Pulse 57 04/15/22 0933  Resp 12 04/15/22 0933  SpO2 98 % 04/15/22 0933  Vitals shown include unvalidated device data.  Last Pain:  Vitals:   04/15/22 0535  TempSrc: Oral  PainSc: 0-No pain      Patients Stated Pain Goal: 3 (03/47/42 5956)  Complications: No notable events documented.

## 2022-04-15 NOTE — Anesthesia Procedure Notes (Signed)
Procedure Name: LMA Insertion Date/Time: 04/15/2022 7:38 AM  Performed by: Rogers Blocker, CRNAPre-anesthesia Checklist: Patient identified, Emergency Drugs available, Suction available and Patient being monitored Patient Re-evaluated:Patient Re-evaluated prior to induction Oxygen Delivery Method: Circle System Utilized Preoxygenation: Pre-oxygenation with 100% oxygen Induction Type: IV induction Ventilation: Mask ventilation without difficulty LMA: LMA inserted LMA Size: 5.0 Number of attempts: 1 Placement Confirmation: positive ETCO2 Tube secured with: Tape Dental Injury: Teeth and Oropharynx as per pre-operative assessment

## 2022-04-15 NOTE — Interval H&P Note (Signed)
History and Physical Interval Note:  04/15/2022 7:20 AM  [name]  has presented today for surgery, with the diagnosis of BENIGN PROSTATE HYPERPLASIA.  The various methods of treatment have been discussed with the patient and family. After consideration of risks, benefits and other options for treatment, the patient has consented to  Procedure(s) with comments: [place] TURP (TRANSURETHRAL RESECTION OF PROSTATE) (N/A) - 90 MINS FOR CASE as a surgical intervention.  The patient's history has been reviewed, patient examined, no change in status, stable for surgery.  He has no cough, sore throat, congestions, dysuria or gross hematuria. I have reviewed the patient's chart and labs. Disc again expectations for storage and emptying symptoms. Disc post-op care, foley and follow-up.  Questions were answered to the patient's satisfaction.     Festus Aloe

## 2022-04-15 NOTE — Op Note (Signed)
Preoperative diagnosis: BPH, lower urinary tract symptoms, prostate cancer  Postoperative diagnosis: Same  Procedure: Thulium laser vaporization of the prostate  Surgeon: Junious Silk  Anesthesia: General  Indication for procedure: Eric Stokes is a 76 year old male with a history of BPH and prostate cancer.  We are preparing him to undergo radiation for his prostate cancer and he is on maximal medical therapy for BPH with continued lower urinary tract symptoms.  Findings: On exam under anesthesia the penis was circumcised without mass or lesion.  Testicles were descended bilaterally and palpably normal.  On digital rectal exam the prostate was 60 to 80 g and smooth without hard area or nodule.  All landmarks preserved and capsule smooth and intact.  Cystoscopy revealed normal urethra, prostate obstructed by trilobar hypertrophy.  Moderate trabeculation of the bladder with some cellules.  Ureteral orifice ease appeared normal and in their normal orthotopic position.  No stone or foreign body.  No mucosal lesions.  Description of procedure: After consent was obtained patient was brought to the operating room.  After adequate anesthesia he was placed in lithotomy position and prepped and draped in the usual sterile fashion.  Timeout was performed to confirm the patient and procedure.  Exam under anesthesia was performed.  Cystoscope was passed per urethra and the bladder inspected.  I swapped that out for the continuous-flow sheath laser scope and a 1000 m laser fiber was passed.  I marked the ureteral orifice ease and then made my typical incisions at 5:00 and 7:00 at the bladder neck and brought that down to outline the median lobe and down to the verumontanum.  The median lobe was then vaporized.  In the right lateral lobe was vaporized from the bladder neck down to the verumontanum and the left lateral lobe.  The bladder was drained and some residual lateral lobe tissue was vaporized.  This created an  excellent channel.  No vaporization out past the verumontanum.  Again trigone and ureteral orifice ease were normal without any injury.  Hemostasis was excellent low-pressure and the scope was removed.  I tried to pass a 39 [place] Foley catheter but it got hung up somewhere in the prostatic urethra.  Therefore I repassed the scope and inspected the prostatic urethra which again appeared normal with excellent hemostasis.  I passed a sensor wire and backed the scope out and then passed a 20 [place] council tip catheter over the wire.  This was left to gravity drainage with 20 cc in the balloon.  Irrigation was clear.  He was awakened taken recovery room in stable condition.  Complications: None  Blood loss: Minimal  Specimens: None  Drains: 20 French Foley catheter, council tip  Disposition: Patient stable to PACU.  I discussed with the patient's wife and daughter the procedure, postop care and follow-up.

## 2022-04-15 NOTE — Anesthesia Postprocedure Evaluation (Signed)
Anesthesia Post Note  Patient: Eric Stokes  Procedure(s) Performed: Marcelino Duster LASER VAPORIZATION OF PROSTATE (Prostate)     Patient location during evaluation: PACU Anesthesia Type: General Level of consciousness: sedated and patient cooperative Pain management: pain level controlled Vital Signs Assessment: post-procedure vital signs reviewed and stable Respiratory status: spontaneous breathing Cardiovascular status: stable Anesthetic complications: no   No notable events documented.  Last Vitals:  Vitals:   04/15/22 1000 04/15/22 1049  BP: 94/66 114/73  Pulse: 67 64  Resp: 11 17  Temp: (!) 36.3 C (!) 36.3 C  SpO2: 98% 97%    Last Pain:  Vitals:   04/15/22 1049  TempSrc:   PainSc: 0-No pain                 Nolon Nations

## 2022-04-16 ENCOUNTER — Encounter (HOSPITAL_BASED_OUTPATIENT_CLINIC_OR_DEPARTMENT_OTHER): Payer: Self-pay | Admitting: Urology

## 2022-04-24 ENCOUNTER — Emergency Department (HOSPITAL_BASED_OUTPATIENT_CLINIC_OR_DEPARTMENT_OTHER): Payer: No Typology Code available for payment source

## 2022-04-24 ENCOUNTER — Other Ambulatory Visit: Payer: Self-pay

## 2022-04-24 ENCOUNTER — Emergency Department (HOSPITAL_BASED_OUTPATIENT_CLINIC_OR_DEPARTMENT_OTHER): Payer: No Typology Code available for payment source | Admitting: Radiology

## 2022-04-24 ENCOUNTER — Encounter (HOSPITAL_BASED_OUTPATIENT_CLINIC_OR_DEPARTMENT_OTHER): Payer: Self-pay

## 2022-04-24 ENCOUNTER — Emergency Department (HOSPITAL_BASED_OUTPATIENT_CLINIC_OR_DEPARTMENT_OTHER)
Admission: EM | Admit: 2022-04-24 | Discharge: 2022-04-24 | Disposition: A | Discharge status: Home / Self Care | Payer: No Typology Code available for payment source | Attending: Emergency Medicine | Admitting: Emergency Medicine

## 2022-04-24 DIAGNOSIS — R42 Dizziness and giddiness: Secondary | ICD-10-CM | POA: Insufficient documentation

## 2022-04-24 DIAGNOSIS — R531 Weakness: Secondary | ICD-10-CM | POA: Diagnosis present

## 2022-04-24 DIAGNOSIS — Z20822 Contact with and (suspected) exposure to covid-19: Secondary | ICD-10-CM | POA: Diagnosis not present

## 2022-04-24 DIAGNOSIS — R0602 Shortness of breath: Secondary | ICD-10-CM | POA: Insufficient documentation

## 2022-04-24 DIAGNOSIS — Z8673 Personal history of transient ischemic attack (TIA), and cerebral infarction without residual deficits: Secondary | ICD-10-CM | POA: Insufficient documentation

## 2022-04-24 DIAGNOSIS — Z8546 Personal history of malignant neoplasm of prostate: Secondary | ICD-10-CM | POA: Insufficient documentation

## 2022-04-24 DIAGNOSIS — Z951 Presence of aortocoronary bypass graft: Secondary | ICD-10-CM | POA: Diagnosis not present

## 2022-04-24 DIAGNOSIS — I25810 Atherosclerosis of coronary artery bypass graft(s) without angina pectoris: Secondary | ICD-10-CM | POA: Insufficient documentation

## 2022-04-24 DIAGNOSIS — I110 Hypertensive heart disease with heart failure: Secondary | ICD-10-CM | POA: Insufficient documentation

## 2022-04-24 DIAGNOSIS — J449 Chronic obstructive pulmonary disease, unspecified: Secondary | ICD-10-CM | POA: Insufficient documentation

## 2022-04-24 DIAGNOSIS — I5022 Chronic systolic (congestive) heart failure: Secondary | ICD-10-CM | POA: Diagnosis not present

## 2022-04-24 DIAGNOSIS — Z7982 Long term (current) use of aspirin: Secondary | ICD-10-CM | POA: Diagnosis not present

## 2022-04-24 DIAGNOSIS — Z79899 Other long term (current) drug therapy: Secondary | ICD-10-CM | POA: Insufficient documentation

## 2022-04-24 DIAGNOSIS — N390 Urinary tract infection, site not specified: Secondary | ICD-10-CM | POA: Diagnosis not present

## 2022-04-24 DIAGNOSIS — Z7951 Long term (current) use of inhaled steroids: Secondary | ICD-10-CM | POA: Insufficient documentation

## 2022-04-24 LAB — CBC
HCT: 37.5 % — ABNORMAL LOW (ref 39.0–52.0)
Hemoglobin: 13.1 g/dL (ref 13.0–17.0)
MCH: 31.2 pg (ref 26.0–34.0)
MCHC: 34.9 g/dL (ref 30.0–36.0)
MCV: 89.3 fL (ref 80.0–100.0)
Platelets: 166 10*3/uL (ref 150–400)
RBC: 4.2 MIL/uL — ABNORMAL LOW (ref 4.22–5.81)
RDW: 13.7 % (ref 11.5–15.5)
WBC: 11.2 10*3/uL — ABNORMAL HIGH (ref 4.0–10.5)
nRBC: 0 % (ref 0.0–0.2)

## 2022-04-24 LAB — BASIC METABOLIC PANEL
Anion gap: 9 (ref 5–15)
BUN: 20 mg/dL (ref 8–23)
CO2: 22 mmol/L (ref 22–32)
Calcium: 8.7 mg/dL — ABNORMAL LOW (ref 8.9–10.3)
Chloride: 106 mmol/L (ref 98–111)
Creatinine, Ser: 0.96 mg/dL (ref 0.61–1.24)
GFR, Estimated: >60 mL/min (ref >60)
Glucose, Bld: 110 mg/dL — ABNORMAL HIGH (ref 70–99)
Potassium: 3.7 mmol/L (ref 3.5–5.1)
Sodium: 137 mmol/L (ref 135–145)

## 2022-04-24 LAB — URINALYSIS, ROUTINE W REFLEX MICROSCOPIC
Bilirubin Urine: NEGATIVE
Glucose, UA: NEGATIVE mg/dL
Ketones, ur: NEGATIVE mg/dL
Nitrite: NEGATIVE
Protein, ur: 100 mg/dL — AB
Specific Gravity, Urine: 1.016 (ref 1.005–1.030)
pH: 6 (ref 5.0–8.0)

## 2022-04-24 LAB — RESP PANEL BY RT-PCR (FLU A&B, COVID) ARPGX2
Influenza A by PCR: NEGATIVE
Influenza B by PCR: NEGATIVE
SARS Coronavirus 2 by RT PCR: NEGATIVE

## 2022-04-24 LAB — MAGNESIUM: Magnesium: 2 mg/dL (ref 1.7–2.4)

## 2022-04-24 LAB — TROPONIN I (HIGH SENSITIVITY)
Troponin I (High Sensitivity): 7 ng/L (ref <18)
Troponin I (High Sensitivity): 7 ng/L (ref <18)

## 2022-04-24 LAB — D-DIMER, QUANTITATIVE: D-Dimer, Quant: 0.93 ug/mL-FEU — ABNORMAL HIGH (ref 0.00–0.50)

## 2022-04-24 LAB — CBG MONITORING, ED: Glucose-Capillary: 97 mg/dL (ref 70–99)

## 2022-04-24 MED ORDER — IOHEXOL 350 MG/ML SOLN
75.0000 mL | Freq: Once | INTRAVENOUS | Status: AC | PRN
  Administered 2022-04-24: 75 mL via INTRAVENOUS

## 2022-04-24 MED ORDER — SODIUM CHLORIDE 0.9 % IV SOLN
1.0000 g | INTRAVENOUS | Status: AC
  Administered 2022-04-24: 1 g via INTRAVENOUS
  Filled 2022-04-24: qty 10

## 2022-04-24 MED ORDER — CEFDINIR 300 MG PO CAPS: 300.0000 mg | ORAL_CAPSULE | Freq: Two times a day (BID) | ORAL | 0 refills | Status: AC

## 2022-04-24 NOTE — Discharge Instructions (Addendum)
Today you were seen in the emergency department for your weakness.    In the emergency department you had extensive lab work and imaging and were found to have a urinary tract infection.    At home, please stay well-hydrated and take the antibiotics we have prescribed you.    Check your MyChart online for the results of any tests that had not resulted by the time you left the emergency department.   Follow-up with your primary doctor in 2-3 days regarding your visit.    Return immediately to the emergency department if you experience any of the following: Fevers, chills, fainting or dizziness, or any other concerning symptoms.    Thank you for visiting our Emergency Department. It was a pleasure taking care of you today.

## 2022-04-24 NOTE — ED Provider Notes (Signed)
[facility]ERGENCY DEPT Provider Note   CSN: [id number] Arrival date & time: 04/24/22  1708     History  Chief Complaint  Patient presents with   Dizziness   shob    Eric Stokes is a 76 y.o. male.  76 year old male with a history of CAD status post CABG, ICM, CHF, atrial fibrillation not on AC, aortic stenosis, and prostate cancer status post TURP 2 weeks ago awaiting radiation who presents to the emergency department with generalized weakness.  Patient is accompanied by his daughter who also provides history.  States that he had his TURP performed and had an indwelling Foley catheter.  Since Foley catheter has been removed he has had dysuria and pain around the tip of his penis.  They state that in the past week he has had increasing generalized fatigue.  Does have longstanding history of orthostasis but it is worsening.  Also occasionally will get dizzy with ambulation.  Did have an episode where he stood up too quickly and had to catch himself in order to prevent from passing out.  Says that he has also had a cough and shortness of breath.  Says that he has had palpitations and episodes where he feels like his heart is racing but denies any chest discomfort otherwise.  No diaphoresis or vomiting.  Denies any fevers at home.  Younger relative is currently sick with a GI illness but no other sick contacts.   Dizziness  Past Medical History:  Diagnosis Date   Aortic stenosis, mild    last echo in epic 05-27-2020  ef 40-45%,  mean grandiant 63mHg, valve area 2.46 cm^2 ,  mild regurg   BPH with obstruction/lower urinary tract symptoms    urologist--- dr eJunious Silk  Carotid bruit present 06/15/2018   2015 Carotid Dopplers:  Essentially normal carotid arteries, with very slight hard plaque in the proximal ICA's and serpentine distal vessels. 1-39% bilateral ICA stenosis. Patent vertebral arteries with antegrade flow. Normal subclavian arteries, bilaterally.   Chronic  [place]yst[place]   followed by dr bAve Filter  COPD ([place] obstructive pulmonary disease) [place]    Coronary artery disease 04/2020   cardiologist--- dr bHaroldine Laws   NSTEMI /  Afib w/ RVR/  Acute CHF/  cardiogenic shock 04-20-2020;  cath 05-17-2020 multivessel disease w/ critical LM;   05-18-2020 s/p CABG x4 and Maze procedure;  [facility], ef 54%   Essential hypertension, benign 12/21/2008   Qualifier: Diagnosis of  By: HPercival Spanish MD, FFarrel Gordon    Glaucoma, both eyes    History of migraine    History of non-ST elevation myocardial infarction (NSTEMI) 05/17/2020   cardiogenic shock,  AF w/ RVR, acute CHF,  ICM;   s/p CABG w/ MAZE   History of squamous cell carcinoma of skin    History of TIA (transient ischemic attack) 02/28/2019   questionable TIA  per neurology note dr sLeonie Man 04-12-2019, likely migraine   Hx of colonic polyps 01/09/2020   Hyperlipidemia    Ischemic cardiomyopathy 04/2020   per cath 09-03-20212  ef 25-30%;   last echo in epic 05-27-2020  ef 40-45%   Malignant neoplasm prostate [place] 10[place]   urologist--- dr eJunious Silk  dx 03/ 2021  active survillance   OA (osteoarthritis)    OSA on CPAP    [facility]    PAF (paroxysmal atrial fibrillation) ([facility] 04[place]   followed by  cardiology---  s/p MAZE procedure 05-18-2020, takes ASA   PVC's (premature ventricular contractions)    S/P CABG x 4 05/18/2020   LIMA to LAD,  RIMA to PDA, SVG to OM1 and OM2   Seasonal allergic rhinitis    Sinus bradycardia    event monitor 01-29-2017  mild bradycardia, no pauses, rare PVCs   Vitamin B 12 deficiency    Vitamin D deficiency 11/26/2010   Wears hearing aid in both ears         Home Medications Prior to Admission medications   Medication Sig Start Date End Date Taking? Authorizing Provider  cefdinir (OMNICEF) 300 MG capsule Take 1 capsule (300 mg total) by mouth 2 (two)  times daily for 7 days. 04/24/22 05/01/22 Yes Fransico Meadow, MD  albuterol (VENTOLIN HFA) 108 (90 Base) MCG/ACT inhaler Inhale 1-2 puffs into the lungs 4 (four) times daily as needed. 09/26/15   [provider]  alfuzosin (UROXATRAL) 10 MG 24 hr tablet Take 10 mg by mouth daily with breakfast.    [provider]  aspirin 81 MG EC tablet Take 81 mg by mouth daily. Swallow whole.    [provider]  B Complex Vitamins (VITAMIN B COMPLEX PO) Take 1 tablet by mouth daily.    [provider]  carboxymethylcellulose (REFRESH PLUS) 0.5 % SOLN Place 1 drop into both eyes 2 (two) times daily as needed (dry eyes).    [provider]  cetirizine (ZYRTEC) 10 MG tablet Take 10 mg by mouth daily. 01/16/13   [provider]  Cholecalciferol (VITAMIN D3) 2000 UNITS TABS Take 2,000 Int'l Units by mouth daily.     [provider]  dutasteride (AVODART) 0.5 MG capsule Take 0.5 mg by mouth daily.    [provider]  eplerenone (INSPRA) 25 MG tablet Take 1 tablet (25 mg total) by mouth daily. 02/25/21   Bensimhon, Shaune Pascal, MD  fluticasone (FLONASE) 50 MCG/ACT nasal spray USE TWO SPRAYS IN EACH NOSTRIL DAILY AS NEEDED. 05/02/20   Leamon Arnt, MD  ibuprofen (ADVIL) 400 MG tablet Take 400 mg by mouth at bedtime.    [provider]  ketotifen (ZADITOR) 0.025 % ophthalmic solution Apply 1 drop to eye as needed. 12/12/20   [provider]  latanoprost (XALATAN) 0.005 % ophthalmic solution INSTILL 1 DROP INTO BOTH EYES AT BEDTIME 08/30/20   Leamon Arnt, MD  montelukast (SINGULAIR) 10 MG tablet TAKE 1 TABLET BY MOUTH EVERY DAY AT BEDTIME 05/27/20   Leamon Arnt, MD  NON FORMULARY Cpap at night    [provider]  rosuvastatin (CRESTOR) 20 MG tablet Take 1 tablet (20 mg total) by mouth daily. 07/31/20   Bensimhon, Shaune Pascal, MD  sacubitril-valsartan (ENTRESTO) 24-26 MG Take 1 tablet by mouth 2 (two) times daily. 06/12/21    Bensimhon, Shaune Pascal, MD  sildenafil (VIAGRA) 100 MG tablet Take 0.5 tablets (50 mg total) by mouth daily as needed for erectile dysfunction. 10/18/20   Bensimhon, Shaune Pascal, MD      Allergies    Flomax [tamsulosin], Cipro [ciprofloxacin hcl], Lipitor [atorvastatin], Lisinopril, and Spironolactone    Review of Systems   Review of Systems  Neurological:  Positive for dizziness.    Physical Exam Updated Vital Signs BP 119/79 (BP Location: Right Arm)   Pulse 83   Temp 99.2 F (37.3 C) (Oral)   Resp 17   Ht '5\' 9"'$  (1.753 m)   Wt 102.1 kg   SpO2 98%  BMI 33.23 kg/m  Physical Exam Vitals and nursing note reviewed.  Constitutional:      General: He is not in acute distress.    Appearance: He is well-developed.  HENT:     Head: Normocephalic and atraumatic.     Right Ear: External ear normal.     Left Ear: External ear normal.     Nose: Nose normal.  Eyes:     Extraocular Movements: Extraocular movements intact.     Conjunctiva/sclera: Conjunctivae normal.     Pupils: Pupils are equal, round, and reactive to light.  Cardiovascular:     Rate and Rhythm: Normal rate. Rhythm irregular.     Heart sounds: Normal heart sounds.  Pulmonary:     Effort: Pulmonary effort is normal. No respiratory distress.     Breath sounds: Normal breath sounds.  Abdominal:     General: There is no distension.     Palpations: Abdomen is soft. There is no mass.     Tenderness: There is no abdominal tenderness. There is no guarding.  Musculoskeletal:        General: No swelling.     Cervical back: Normal range of motion and neck supple.     Right lower leg: No edema.     Left lower leg: No edema.  Skin:    General: Skin is warm and dry.     Capillary Refill: Capillary refill takes less than 2 seconds.  Neurological:     Mental Status: He is alert. Mental status is at baseline.     Comments: MENTAL STATUS: AAOx3 CRANIAL NERVES: II: Pupils equal and reactive 3 mm BL, no RAPD, no VF deficits III,  IV, VI: EOM intact, no gaze preference or deviation, no nystagmus. V: normal sensation to light touch in V1, V2, and V3 segments bilaterally VII: no facial weakness or asymmetry, no nasolabial fold flattening VIII: normal hearing to speech and finger friction IX, X: normal palatal elevation, no uvular deviation XI: 5/5 head turn and 5/5 shoulder shrug bilaterally XII: midline tongue protrusion MOTOR: 5/5 strength in R shoulder flexion, elbow flexion and extension, and grip strength. 5/5 strength in L shoulder flexion, elbow flexion and extension, and grip strength.  5/5 strength in R hip and knee flexion, knee extension, ankle plantar and dorsiflexion. 5/5 strength in L hip and knee flexion, knee extension, ankle plantar and dorsiflexion. SENSORY: Normal sensation to light touch in all extremities COORD: Normal finger to nose and heel to shin, no tremor, no dysmetria STATION: normal stance, no truncal ataxia GAIT: Normal with assistance of cane   Psychiatric:        Mood and Affect: Mood normal.        Behavior: Behavior normal.     ED Results / Procedures / Treatments   Labs (all labs ordered are listed, but only abnormal results are displayed) Labs Reviewed  BASIC METABOLIC PANEL - Abnormal; Notable for the following components:      Result Value   Glucose, Bld 110 (*)    Calcium 8.7 (*)    All other components within normal limits  CBC - Abnormal; Notable for the following components:   WBC 11.2 (*)    RBC 4.20 (*)    HCT 37.5 (*)    All other components within normal limits  URINALYSIS, ROUTINE W REFLEX MICROSCOPIC - Abnormal; Notable for the following components:   APPearance HAZY (*)    Hgb urine dipstick LARGE (*)    Protein, ur 100 (*)  Leukocytes,Ua LARGE (*)    Bacteria, UA RARE (*)    All other components within normal limits  D-DIMER, QUANTITATIVE - Abnormal; Notable for the following components:   D-Dimer, Quant 0.93 (*)    All other components within normal  limits  RESP PANEL BY RT-PCR (FLU A&B, COVID) ARPGX2  URINE CULTURE  MAGNESIUM  CBG MONITORING, ED  TROPONIN I (HIGH SENSITIVITY)  TROPONIN I (HIGH SENSITIVITY)    EKG EKG Interpretation  Date/Time:  Thursday April 24 2022 17:19:58 EDT Ventricular Rate:  77 PR Interval:  184 QRS Duration: 100 QT Interval:  380 QTC Calculation: 430 R Axis:   -24 Text Interpretation: Normal sinus rhythm Incomplete right bundle branch block Borderline ECG When compared with ECG of 26-Sep-2021 15:26, Incomplete right bundle branch block is now Present Confirmed by Margaretmary Eddy [phone]) on 04/24/2022 5:28:03 PM  Radiology CT Angio Chest PE W and/or Wo Contrast  Result Date: 04/24/2022 CLINICAL DATA:  Dizziness.  Fatigue. EXAM: CT ANGIOGRAPHY CHEST WITH CONTRAST TECHNIQUE: Multidetector CT imaging of the chest was performed using the standard protocol during bolus administration of intravenous contrast. Multiplanar CT image reconstructions and MIPs were obtained to evaluate the vascular anatomy. RADIATION DOSE REDUCTION: This exam was performed according to the departmental dose-optimization program which includes automated exposure control, adjustment of the mA and/or kV according to patient size and/or use of iterative reconstruction technique. CONTRAST:  64m OMNIPAQUE IOHEXOL 350 MG/ML SOLN COMPARISON:  Chest CT 05/26/2020 FINDINGS: Cardiovascular: Thoracic aortic vascular calcifications. Cardiomegaly. No pericardial effusion. Coronary arterial vascular calcifications. Adequate opacification of the pulmonary arterial system. No evidence for acute pulmonary embolus. Left atrial clipping. Mediastinum/Nodes: No enlarged axillary, mediastinal or hilar lymphadenopathy. Normal appearance of the esophagus. Lungs/Pleura: Central airways are patent. Dependent atelectasis within the bilateral lower lobes. No pleural effusion or pneumothorax. Upper Abdomen: Stable low-attenuation lesions within the liver, likely  hepatic cysts. No acute process. Musculoskeletal: Thoracic spine degenerative changes. No aggressive or acute appearing osseous lesions. Review of the MIP images confirms the above findings. IMPRESSION: No evidence for acute pulmonary embolus. No acute process in the chest. Aortic Atherosclerosis (ICD10-I70.0). Electronically Signed   By: DLovey NewcomerM.D.   On: 04/24/2022 19:34   DG Chest 2 View  Result Date: 04/24/2022 CLINICAL DATA:  Shortness of breath.  Cough and fatigue. EXAM: CHEST - 2 VIEW COMPARISON:  06/11/2020 FINDINGS: Median sternotomy and CABG with left atrial clipping. Stable heart size and mediastinal contours. There is no acute airspace disease, pulmonary edema, pleural effusion or pneumothorax. Thoracic spondylosis IMPRESSION: No acute chest findings. Electronically Signed   By: MKeith RakeM.D.   On: 04/24/2022 18:39    Procedures Procedures   Medications Ordered in ED Medications  cefTRIAXone (ROCEPHIN) 1 g in sodium chloride 0.9 % 100 mL IVPB (0 g Intravenous Stopped 04/24/22 1857)  iohexol (OMNIPAQUE) 350 MG/ML injection 75 mL (75 mLs Intravenous Contrast Given 04/24/22 1911)    ED Course/ Medical Decision Making/ A&P Clinical Course as of 04/25/22 1953  Thu Apr 24, 2022  1809 Urinalysis reviewed and interpreted by me as being consistent with urinary tract infection.  Treating with ceftriaxone his last urine culture did show Serratia that was susceptible to ceftriaxone. [RP]  19935Chest x-ray reviewed and interpreted by me as showing no acute abnormality. [RP]    Clinical Course User Index [RP] PFransico Meadow MD  Medical Decision Making Amount and/or Complexity of Data Reviewed Labs: ordered. Radiology: ordered.  Risk Prescription drug management.   76 year old male with a history of CAD status post CABG, ICM, CHF, atrial fibrillation not on AC, aortic stenosis, and prostate cancer status post TURP 2 weeks ago awaiting radiation who  presents to the emergency department with generalized weakness.   Initial Ddx:  UTI, MI, PE, worsening aortic stenosis, viral URI, pneumonia, CHF  MDM:  Feel the patient is likely suffering a UTI in the setting of his recent TURP procedure.  Also on the differential would be MI given his extensive cardiac history and with his age is more likely to present atypically.  Also considered PE given the patient's shortness of breath and recent procedure with active cancer.  Worsening aortic stenosis considered but less likely given the fact the patient is not having a severe murmur at this time.  Viral URI pneumonia on the differential with the patient's shortness of breath and weakness.  CHF exacerbation also possible but patient does not appear volume overloaded at this time.  Plan:  Labs Troponin D-dimer COVID/flu Urinalysis EKG Chest x-ray  ED Summary:  Patient underwent the above work-up which showed a urinalysis that was consistent with possible urinary tract infection.  He was treated with IV ceftriaxone based on prior cultures.  Also had elevated D-dimer so underwent a CTA which did not show evidence of PE or pneumonia.  Serial troponins and EKG were reassuring.  COVID and flu were negative.  Given the patient's reassuring evaluation will treat him for urinary tract infection and have him follow-up with his primary doctor for further management.  Dispo: DC Home. Return precautions discussed including, but not limited to, those listed in the AVS. Allowed pt time to ask questions which were answered fully prior to dc.   Additional history obtained from daughter Records reviewed Care Everywhere The following labs were independently interpreted: Chemistry, Serial Troponins, D-dimer, and Urinalysis I independently visualized the following imaging with scope of interpretation limited to determining acute life threatening conditions related to emergency care: Chest x-ray, which revealed no acute  abnormality   Final Clinical Impression(s) / ED Diagnoses Final diagnoses:  Weakness  Shortness of breath  Urinary tract infection with hematuria, site unspecified    Rx / DC Orders ED Discharge Orders          Ordered    cefdinir (OMNICEF) 300 MG capsule  2 times daily        04/24/22 2226              Fransico Meadow, MD 04/25/22 [phone]

## 2022-04-24 NOTE — ED Triage Notes (Addendum)
Pt had a Turp on 8/29 under general anesthesia and states he has not felt right since then.  C/o dizziness, dysuria, fatigue and SHOB.   Rt. Calf pain Extensive health hx

## 2022-04-25 ENCOUNTER — Telehealth (HOSPITAL_COMMUNITY): Payer: Self-pay | Admitting: *Deleted

## 2022-04-25 NOTE — Telephone Encounter (Signed)
Pts daughter called stating pt has been short of breath and orthostatic patient was taken to the ER for a fall a few days ago and was told to f/u with cardiology. Appt scheduled with APP

## 2022-04-27 LAB — URINE CULTURE: Culture: >=100000 — AB

## 2022-04-28 ENCOUNTER — Telehealth: Payer: Self-pay | Admitting: *Deleted

## 2022-04-28 NOTE — Progress Notes (Signed)
Advanced Heart Failure Clinic Note   Referring Physician: PCP: Eric Catalan, PA PCP-Cardiologist: Eric. Haroldine [facility]    HPI  [name] is a 76  year old with obesity, HTN, OSA on CPAP, HL, prostate CA, PAF, CAD s/p CABG 16/1/09 and systolic HF.   Had ECHO in 2020 that showed EF 60-65% with normal RV.    Presented to [facility] via EMS with chest pain and shortness of breath. Arrived in the ED was in A fib RVR and had ST depression. HS Trop 6045>[id number]. Cath showed severe multivessel disease with elevated filling pressures. Echo EF 25-30% with mild AS. IABP placed. Underwent CABG 05/18/20 with Maze and LAA clipping. LIMA -> LAD, RIMA  -> PDA. Left radial OM1-> OM2. Post CABG course was slow but relatively uneventful. - ECHO repeated 10/10 and showed EF improved 40-45% normal RV. Discharged home on 05/30/20.   Several med intolerances. Bisoprolol stopped due to low BP. Developed gynecomastia w/ spiro. Now on eplerenone. Wilder Glade stopped due recurrent UTIs. Off Eliquis due to hematuria.  He presents today for routine f/u. Also followed at the [facility] hospital. Has struggled recently w/ exertional dyspnea. No chest pain. Had nuclear stress test done at the [place] last week. He has result note with him. Study showed no ischemia. EF was 54%.   BP mildly elevated in clinic today, 140/62. Checked x 2. Usually well controlled at home ~119 systolic. Last week he had low BP w/ dizziness but no further recurrence.    Studies:   RHC/LHC 05/17/20  Ost LM to Mid LM lesion is 99% stenosed. Ost RCA to Prox RCA lesion is 99% stenosed. Prox RCA to Mid RCA lesion is 80% stenosed. RA 17 PA 40/11 PCWP 28  CO 5 CI 2.2   Echo 05/17/20  1. There is akinesis of the mid-to-distal anterolateral, all  mid-to-distal septal, mid-to-distal anterior, and all apical segments. The  rest of the LV segments are hypokinetic. Findings are suggestive of  multivessel CAD.Marland Kitchen Left ventricular ejection  fraction, by estimation, is 25 to  30%. The left ventricle has severely  decreased function. The left ventricle demonstrates regional wall motion  abnormalities (see scoring diagram/findings for description). There is  moderate concentric left ventricular  hypertrophy. Left ventricular diastolic parameters are indeterminate.   2. Right ventricular systolic function is normal. The right ventricular  size is normal. There is mildly elevated pulmonary artery systolic  pressure.   3. Left atrial size was moderately dilated.   4. The mitral valve is degenerative. Mild mitral valve regurgitation. No  evidence of mitral stenosis.   5. The aortic valve is tricuspid. There is moderate calcification of the  aortic valve. There is moderate thickening of the aortic valve. Aortic  valve regurgitation is not visualized. Mild aortic valve stenosis.   6. The inferior vena cava is dilated in size with >50% respiratory  variability, suggesting right atrial pressure of 8 mmHg.   Echo 10/21  EF 40-45%    Past Medical History:  Diagnosis Date   Aortic stenosis, mild    last echo in epic 05-27-2020  ef 40-45%,  mean grandiant 15mHg, valve area 2.46 cm^2 ,  mild regurg   BPH with obstruction/lower urinary tract symptoms    urologist--- [facility]  Carotid bruit present 06/15/2018   2015 Carotid Dopplers:  Essentially normal carotid arteries, with very slight hard plaque in the proximal ICA's and serpentine distal vessels. 1-39% bilateral ICA stenosis. Patent vertebral arteries with antegrade flow. Normal subclavian arteries,  bilaterally.   Chronic systolic CHF (congestive heart failure) ([place]) 05/2020   followed by Eric [facility]   COPD (chronic obstructive pulmonary disease) ([place])    Coronary artery disease 04/2020   cardiologist--- Eric [facility];   NSTEMI /  Afib w/ RVR/  Acute CHF/  cardiogenic shock 04-20-2020;  cath 05-17-2020 multivessel disease w/ critical LM;   05-18-2020 s/p CABG x4 and Maze procedure;  nuclear stress study done [facility]  09-17-2021 no ischemia, ef 54%   Essential hypertension, benign 12/21/2008   Qualifier: Diagnosis of  By: Eric Spanish, MD, [facility]     Glaucoma, both eyes    History of migraine    History of non-ST elevation myocardial infarction (NSTEMI) 05/17/2020   cardiogenic shock,  AF w/ RVR, acute CHF,  ICM;   s/p CABG w/ MAZE   History of squamous cell carcinoma of skin    History of TIA (transient ischemic attack) 02/28/2019   questionable TIA  per neurology note [facility], 04-12-2019, likely migraine   Hx of colonic polyps 01/09/2020   Hyperlipidemia    Ischemic cardiomyopathy 04/2020   per cath 09-03-20212  ef 25-30%;   last echo in epic 05-27-2020  ef 40-45%   Malignant neoplasm prostate ([place]) 10/2019   urologist--- [facility];  dx 03/ 2021  active survillance   OA (osteoarthritis)    OSA on CPAP    Diagnosed at the [facility] 2016    PAF (paroxysmal atrial fibrillation) ([place]) 04/2020   followed by cardiology---  s/p MAZE procedure 05-18-2020, takes ASA   PVC's (premature ventricular contractions)    S/P CABG x 4 05/18/2020   LIMA to LAD,  RIMA to PDA, SVG to OM1 and OM2   Seasonal allergic rhinitis    Sinus bradycardia    event monitor 01-29-2017  mild bradycardia, no pauses, rare PVCs   Vitamin B 12 deficiency    Vitamin D deficiency 11/26/2010   Wears hearing aid in both ears     Current Outpatient Medications  Medication Sig Dispense Refill   albuterol (VENTOLIN HFA) 108 (90 Base) MCG/ACT inhaler Inhale 1-2 puffs into the lungs 4 (four) times daily as needed.     alfuzosin (UROXATRAL) 10 MG 24 hr tablet Take 10 mg by mouth daily with breakfast.     aspirin 81 MG EC tablet Take 81 mg by mouth daily. Swallow whole.     B Complex Vitamins (VITAMIN B COMPLEX PO) Take 1 tablet by mouth daily.     carboxymethylcellulose (REFRESH PLUS) 0.5 % SOLN Place 1 drop into both eyes 2 (two) times daily as needed (dry eyes).     cefdinir (OMNICEF) 300 MG capsule Take 1 capsule (300 mg  total) by mouth 2 (two) times daily for 7 days. 14 capsule 0   cetirizine (ZYRTEC) 10 MG tablet Take 10 mg by mouth daily.     Cholecalciferol (VITAMIN D3) 2000 UNITS TABS Take 2,000 Int'l Units by mouth daily.      dutasteride (AVODART) 0.5 MG capsule Take 0.5 mg by mouth daily.     eplerenone (INSPRA) 25 MG tablet Take 1 tablet (25 mg total) by mouth daily. 30 tablet 6   fluticasone (FLONASE) 50 MCG/ACT nasal spray USE TWO SPRAYS IN EACH NOSTRIL DAILY AS NEEDED. 48 mL 0   ibuprofen (ADVIL) 400 MG tablet Take 400 mg by mouth at bedtime.     ketotifen (ZADITOR) 0.025 % ophthalmic solution Apply 1 drop to eye as needed.     latanoprost (  XALATAN) 0.005 % ophthalmic solution INSTILL 1 DROP INTO BOTH EYES AT BEDTIME 11.25 mL 1   montelukast (SINGULAIR) 10 MG tablet TAKE 1 TABLET BY MOUTH EVERY DAY AT BEDTIME 90 tablet 3   NON FORMULARY Cpap at night     rosuvastatin (CRESTOR) 20 MG tablet Take 1 tablet (20 mg total) by mouth daily. 90 tablet 3   sacubitril-valsartan (ENTRESTO) 24-26 MG Take 1 tablet by mouth 2 (two) times daily. 60 tablet 0   sildenafil (VIAGRA) 100 MG tablet Take 0.5 tablets (50 mg total) by mouth daily as needed for erectile dysfunction. 20 tablet 1   No current facility-administered medications for this visit.    Allergies  Allergen Reactions   Flomax [Tamsulosin] Other (See Comments)    dizziness   Cipro [Ciprofloxacin Hcl] Swelling    Knee swelling, joint pain   Lipitor [Atorvastatin] Other (See Comments)    Leg Cramps   Lisinopril Cough   Spironolactone Other (See Comments)      Social History   Socioeconomic History   Marital status: Married    Spouse name: Not on file   Number of children: Not on file   Years of education: 13   Highest education level: Not on file  Occupational History   Occupation: retired  Tobacco Use   Smoking status: Former    Years: 3.00    Types: Cigarettes    Quit date: 1967    Years since quitting: 56.7   Smokeless tobacco:  Never  Vaping Use   Vaping Use: Never used  Substance and Sexual Activity   Alcohol use: Yes    Alcohol/week: 2.0 - 6.0 standard drinks of alcohol    Types: 2 - 6 Cans of beer per week   Drug use: Never   Sexual activity: Yes  Other Topics Concern   Not on file  Social History Narrative   Is a [place] veteran, has trouble sleeping most nights. Often experiences nightmares w/anesthesia meds.   Social Determinants of Health   Financial Resource Strain: Low Risk  (03/05/2020)   Overall Financial Resource Strain (CARDIA)    Difficulty of Paying Living Expenses: Not hard at all  Food Insecurity: No Food Insecurity (03/05/2020)   Hunger Vital Sign    Worried About Running Out of Food in the Last Year: Never true    Ran Out of Food in the Last Year: Never true  Transportation Needs: No Transportation Needs (03/05/2020)   PRAPARE - Hydrologist (Medical): No    Lack of Transportation (Non-Medical): No  Physical Activity: Unknown (03/05/2020)   Exercise Vital Sign    Days of Exercise per Week: 7 days    Minutes of Exercise per Session: Not on file  Stress: No Stress Concern Present (03/05/2020)   [place]    Feeling of Stress : Not at all  Social Connections: Moderately Isolated (03/05/2020)   Social Connection and Isolation Panel [NHANES]    Frequency of Communication with Friends and Family: More than three times a week    Frequency of Social Gatherings with Friends and Family: More than three times a week    Attends Religious Services: Never    Marine scientist or Organizations: No    Attends Archivist Meetings: Never    Marital Status: Married  Human resources officer Violence: Not At Risk (03/05/2020)   Humiliation, Afraid, Rape, and Kick questionnaire    Fear of Current  or Ex-Partner: No    Emotionally Abused: No    Physically Abused: No    Sexually Abused: No      Family  History  Problem Relation Age of Onset   Diabetes Mother    COPD Father    Stroke Paternal Grandfather     There were no vitals filed for this visit.   Wt Readings from Last 3 Encounters:  04/24/22 102.1 kg (225 lb)  04/15/22 104.2 kg (229 lb 11.2 oz)  09/26/21 104.6 kg (230 lb 9.6 oz)   PHYSICAL EXAM: General:  Well appearing. No respiratory difficulty HEENT: normal Neck: supple. no JVD. Carotids 2+ bilat; no bruits. No lymphadenopathy or thyromegaly appreciated. Cor: PMI nondisplaced. Regular rate & rhythm. No rubs, gallops or murmurs. Lungs: clear Abdomen: soft, nontender, nondistended. No hepatosplenomegaly. No bruits or masses. Good bowel sounds. Extremities: no cyanosis, clubbing, rash, edema Neuro: alert & oriented x 3, cranial nerves grossly intact. moves all 4 extremities w/o difficulty. Affect pleasant.   ASSESSMENT & PLAN:  1. CAD/NSTEMI with critcal LM disease - LHC 9/21  with severe multivessel disease.  - s/p CABG 05/18/20 with Maze and LAA clipping. LIMA -> LAD, RIMA  -> PDA. Left radial OM1-> OM2 - No chest pain - NST 2/23 at [facility] negative for ischemia  - on ASA, statin and ? blocker  2. Chronic Systolic Heart Failure due to ischemic CM - ECHO in 2020 showed EF 60-65%.  - ECHO 9/21 25-30%. RV normal. Mild AS   - ECHO 05/27/2020 EF improved 40-45% normal RV - Nuclear Stress test at [facility] 2/23 w/ EF 54%  - NYHA Class II. Euvolemic on exam  - Failed bisoprolol due to low BP  - Continue eplerenone 25 mg (failed spiro due to gynecomastia) - Continue Entresto 24-26 bid. No titration w/ issues w/ low BP   - Off Jardiance with UTIs  3. PAF  - s/p MAZE w/ LAA Clip  - In NSR today on EKG  - Off Eliquis with hematuria.    4. H/o Prostate Cancer  - continue alfuzosin  5. Erectile dysfunction - continue sildenafil   F/u in 1 year w/ Eric. Melynda Keller, FNP 04/28/22   Patient seen and examined with the above-signed Advanced  Practice Provider and/or Housestaff. I personally reviewed laboratory data, imaging studies and relevant notes. I independently examined the patient and formulated the important aspects of the plan. I have edited the note to reflect any of my changes or salient points. I have personally discussed the plan with the patient and/or family.  Doing well. NYHA II Now followed at the [facility]. Denies CP or SOB. Recent nuclear stress test reviewed and no ischemia. EF 54%  Maintaining NSR.   General:  Well appearing. No resp difficulty HEENT: normal Neck: supple. no JVD. Carotids 2+ bilat; no bruits. No lymphadenopathy or thryomegaly appreciated. Cor: PMI nondisplaced. Regular rate & rhythm. No rubs, gallops or murmurs. Lungs: clear Abdomen: obese soft, nontender, nondistended. No hepatosplenomegaly. No bruits or masses. Good bowel sounds. Extremities: no cyanosis, clubbing, rash, edema Neuro: alert & orientedx3, cranial nerves grossly intact. moves all 4 extremities w/o difficulty. Affect pleasant  Doing well. On good meds. He will follow at the [facility]. We will see him once a year to keep tabs on him.  Rafael Bihari, FNP  8:13 AM

## 2022-04-28 NOTE — Telephone Encounter (Signed)
Post ED Visit - Positive Culture Follow-up  Culture report reviewed by antimicrobial stewardship pharmacist: [place] Team '[]'$  Elenor Quinones, Pharm.D. '[]'$  Heide Guile, Pharm.D., BCPS AQ-ID '[]'$  Parks Neptune, Pharm.D., BCPS '[]'$  Alycia Rossetti, Pharm.D., BCPS '[]'$  [place], [place].D., BCPS, AAHIVP '[]'$  Legrand Como, Pharm.D., BCPS, AAHIVP '[]'$  Salome Arnt, PharmD, BCPS '[]'$  Johnnette Gourd, PharmD, BCPS '[]'$  Hughes Better, PharmD, BCPS '[]'$  Leeroy Cha, PharmD '[]'$  Laqueta Linden, PharmD, BCPS '[]'$  Albertina Parr, PharmD  [place] Team '[]'$  Leodis Sias, PharmD '[]'$  Lindell Spar, PharmD '[]'$  Royetta Asal, PharmD '[]'$  Graylin Shiver, Rph '[]'$  Rema Fendt) Glennon Mac, PharmD '[]'$  Arlyn Dunning, PharmD '[]'$  Netta Cedars, PharmD '[]'$  Dia Sitter, PharmD '[]'$  Leone Haven, PharmD '[]'$  Gretta Arab, PharmD '[]'$  Theodis Shove, PharmD '[]'$  Peggyann Juba, PharmD '[]'$  Reuel Boom, PharmD   Positive urine culture Treated with Cefdinie, organism sensitive to the same and no further patient follow-up is required at this time. Luisa Hart , PharmD  Harlon Flor Talley 04/28/2022, 10:23 AM

## 2022-04-29 ENCOUNTER — Ambulatory Visit (HOSPITAL_COMMUNITY)
Admission: RE | Admit: 2022-04-29 | Discharge: 2022-04-29 | Disposition: A | Discharge status: Home / Self Care | Payer: Medicare Other | Source: Ambulatory Visit | Attending: Family Medicine | Admitting: Family Medicine

## 2022-04-29 ENCOUNTER — Encounter (HOSPITAL_COMMUNITY): Payer: Self-pay

## 2022-04-29 ENCOUNTER — Other Ambulatory Visit (HOSPITAL_BASED_OUTPATIENT_CLINIC_OR_DEPARTMENT_OTHER): Payer: Self-pay

## 2022-04-29 VITALS — BP 122/64 | HR 64 | Wt 228.2 lb

## 2022-04-29 DIAGNOSIS — Z951 Presence of aortocoronary bypass graft: Secondary | ICD-10-CM | POA: Diagnosis not present

## 2022-04-29 DIAGNOSIS — I2510 Atherosclerotic heart disease of native coronary artery without angina pectoris: Secondary | ICD-10-CM

## 2022-04-29 DIAGNOSIS — Z79899 Other long term (current) drug therapy: Secondary | ICD-10-CM | POA: Insufficient documentation

## 2022-04-29 DIAGNOSIS — R319 Hematuria, unspecified: Secondary | ICD-10-CM | POA: Insufficient documentation

## 2022-04-29 DIAGNOSIS — Z9079 Acquired absence of other genital organ(s): Secondary | ICD-10-CM | POA: Insufficient documentation

## 2022-04-29 DIAGNOSIS — I480 Paroxysmal atrial fibrillation: Secondary | ICD-10-CM | POA: Diagnosis not present

## 2022-04-29 DIAGNOSIS — I110 Hypertensive heart disease with heart failure: Secondary | ICD-10-CM | POA: Diagnosis not present

## 2022-04-29 DIAGNOSIS — R42 Dizziness and giddiness: Secondary | ICD-10-CM | POA: Diagnosis not present

## 2022-04-29 DIAGNOSIS — N5201 Erectile dysfunction due to arterial insufficiency: Secondary | ICD-10-CM

## 2022-04-29 DIAGNOSIS — Z8546 Personal history of malignant neoplasm of prostate: Secondary | ICD-10-CM | POA: Insufficient documentation

## 2022-04-29 DIAGNOSIS — I252 Old myocardial infarction: Secondary | ICD-10-CM | POA: Insufficient documentation

## 2022-04-29 DIAGNOSIS — I255 Ischemic cardiomyopathy: Secondary | ICD-10-CM | POA: Diagnosis not present

## 2022-04-29 DIAGNOSIS — C61 Malignant neoplasm of prostate: Secondary | ICD-10-CM

## 2022-04-29 DIAGNOSIS — E669 Obesity, unspecified: Secondary | ICD-10-CM | POA: Diagnosis not present

## 2022-04-29 DIAGNOSIS — N529 Male erectile dysfunction, unspecified: Secondary | ICD-10-CM | POA: Insufficient documentation

## 2022-04-29 DIAGNOSIS — G4733 Obstructive sleep apnea (adult) (pediatric): Secondary | ICD-10-CM | POA: Diagnosis not present

## 2022-04-29 DIAGNOSIS — Z87440 Personal history of urinary (tract) infections: Secondary | ICD-10-CM | POA: Insufficient documentation

## 2022-04-29 DIAGNOSIS — N390 Urinary tract infection, site not specified: Secondary | ICD-10-CM | POA: Diagnosis not present

## 2022-04-29 DIAGNOSIS — I5022 Chronic systolic (congestive) heart failure: Secondary | ICD-10-CM

## 2022-04-29 MED ORDER — LOSARTAN POTASSIUM 25 MG PO TABS: 12.5000 mg | ORAL_TABLET | Freq: Every day | ORAL | 3 refills | Status: DC

## 2022-04-29 MED ORDER — LOSARTAN POTASSIUM 25 MG PO TABS: 12.5000 mg | ORAL_TABLET | Freq: Every day | ORAL | 0 refills | Status: DC

## 2022-04-29 NOTE — Progress Notes (Signed)
ReDS Vest / Clip - 04/29/22 0800       ReDS Vest / Clip   Station Marker D    Ruler Value 37    ReDS Value Range Low volume    ReDS Actual Value 34

## 2022-04-29 NOTE — Patient Instructions (Signed)
REMAIN off Inspra  STOP Entresto  START Losartan 12.5 mg (one half) tab nightly at bedtime  Your physician wants you to follow-up in: 5 months with Dr Haroldine Laws. You will receive a reminder letter in the mail two months in advance. If you don't receive a letter, please call our office to schedule the follow-up appointment.   Do the following things EVERYDAY: Weigh yourself in the morning before breakfast. Write it down and keep it in a log. Take your medicines as prescribed Eat low salt foods--Limit salt (sodium) to 2000 mg per day.  Stay as active as you can everyday Limit all fluids for the day to less than 2 liters   At the [facility], you and your health needs are our priority. As part of our continuing mission to provide you with exceptional heart care, we have created designated Provider Care Teams. These Care Teams include your primary Cardiologist (physician) and Advanced Practice Providers (APPs- Physician Assistants and Nurse Practitioners) who all work together to provide you with the care you need, when you need it.   You may see any of the following providers on your designated Care Team at your next follow up: Dr Glori Bickers Dr Loralie Champagne Dr. Roxana Hires, NP Lyda Jester, [facility] [place], [place] Forestine Na, NP Audry Riles, PharmD   Please be sure to bring in all your medications bottles to every appointment.   If you have any questions or concerns before your next appointment please send [place] a message through [place] or call our office at [phone].    TO LEAVE A MESSAGE FOR THE NURSE SELECT OPTION 2, PLEASE LEAVE A MESSAGE INCLUDING: YOUR NAME DATE OF BIRTH CALL BACK NUMBER REASON FOR CALL**this is important as we prioritize the call backs  YOU WILL RECEIVE A CALL BACK THE SAME DAY AS LONG AS YOU CALL BEFORE 4:00 PM

## 2022-04-29 NOTE — Progress Notes (Signed)
GU Location of Tumor / Histology: Prostate Ca  If Prostate Cancer, Gleason Score is (3 + 4) and PSA is (2.29 as of 11/2021)  Biopsies      Past/Anticipated interventions by urology, if any: NA  Past/Anticipated interventions by medical oncology, if any: NA  Weight changes, if any: No  IPPS:  22 SHIM:  14  Bowel/Bladder complaints, if any:  UTI on ABT tx and diarrhea at the present time.  Nausea/Vomiting, if any: No  Pain issues, if any:  0/10  SAFETY ISSUES: Prior radiation? No Pacemaker/ICD? No Possible current pregnancy? Male Is the patient on methotrexate? No  Current Complaints / other details:  Need more information on treatment options.

## 2022-05-07 ENCOUNTER — Ambulatory Visit
Admission: RE | Admit: 2022-05-07 | Discharge: 2022-05-07 | Disposition: A | Discharge status: Home / Self Care | Payer: No Typology Code available for payment source | Source: Ambulatory Visit | Attending: Radiation Oncology | Admitting: Radiation Oncology

## 2022-05-07 ENCOUNTER — Other Ambulatory Visit: Payer: Self-pay

## 2022-05-07 VITALS — BP 134/67 | HR 73 | Temp 97.6°F | Resp 20 | Ht 69.0 in | Wt 227.0 lb

## 2022-05-07 DIAGNOSIS — E785 Hyperlipidemia, unspecified: Secondary | ICD-10-CM | POA: Diagnosis not present

## 2022-05-07 DIAGNOSIS — Z79899 Other long term (current) drug therapy: Secondary | ICD-10-CM | POA: Insufficient documentation

## 2022-05-07 DIAGNOSIS — J449 Chronic obstructive pulmonary disease, unspecified: Secondary | ICD-10-CM | POA: Insufficient documentation

## 2022-05-07 DIAGNOSIS — E559 Vitamin D deficiency, unspecified: Secondary | ICD-10-CM | POA: Diagnosis not present

## 2022-05-07 DIAGNOSIS — I350 Nonrheumatic aortic (valve) stenosis: Secondary | ICD-10-CM | POA: Insufficient documentation

## 2022-05-07 DIAGNOSIS — I110 Hypertensive heart disease with heart failure: Secondary | ICD-10-CM | POA: Diagnosis not present

## 2022-05-07 DIAGNOSIS — I252 Old myocardial infarction: Secondary | ICD-10-CM | POA: Insufficient documentation

## 2022-05-07 DIAGNOSIS — C61 Malignant neoplasm of prostate: Secondary | ICD-10-CM | POA: Diagnosis not present

## 2022-05-07 DIAGNOSIS — E538 Deficiency of other specified B group vitamins: Secondary | ICD-10-CM | POA: Diagnosis not present

## 2022-05-07 DIAGNOSIS — Z9049 Acquired absence of other specified parts of digestive tract: Secondary | ICD-10-CM | POA: Diagnosis not present

## 2022-05-07 DIAGNOSIS — Z8719 Personal history of other diseases of the digestive system: Secondary | ICD-10-CM | POA: Diagnosis not present

## 2022-05-07 DIAGNOSIS — Z85828 Personal history of other malignant neoplasm of skin: Secondary | ICD-10-CM | POA: Insufficient documentation

## 2022-05-07 DIAGNOSIS — I480 Paroxysmal atrial fibrillation: Secondary | ICD-10-CM | POA: Insufficient documentation

## 2022-05-07 DIAGNOSIS — I2510 Atherosclerotic heart disease of native coronary artery without angina pectoris: Secondary | ICD-10-CM | POA: Insufficient documentation

## 2022-05-07 DIAGNOSIS — Z8673 Personal history of transient ischemic attack (TIA), and cerebral infarction without residual deficits: Secondary | ICD-10-CM | POA: Diagnosis not present

## 2022-05-07 DIAGNOSIS — I5022 Chronic systolic (congestive) heart failure: Secondary | ICD-10-CM | POA: Insufficient documentation

## 2022-05-07 DIAGNOSIS — Z87891 Personal history of nicotine dependence: Secondary | ICD-10-CM | POA: Insufficient documentation

## 2022-05-07 DIAGNOSIS — M47814 Spondylosis without myelopathy or radiculopathy, thoracic region: Secondary | ICD-10-CM | POA: Diagnosis not present

## 2022-05-07 DIAGNOSIS — I255 Ischemic cardiomyopathy: Secondary | ICD-10-CM | POA: Diagnosis not present

## 2022-05-07 NOTE — Progress Notes (Signed)
Radiation Oncology         [phone] ________________________________  Initial Outpatient Consultation  Name: Eric Stokes MRN: [id number]  Date: 05/07/2022  DOB: [birth year]  LA:GTXMIW, Thornell Mule, PA  Festus Aloe, MD   REFERRING PHYSICIAN: Festus Aloe, MD  DIAGNOSIS: 76 y.o. gentleman with Stage T1c adenocarcinoma of the prostate with Gleason score of 3+4, and PSA of 4.6.    ICD-10-CM   1. Malignant neoplasm of prostate ([place])  C61       HISTORY OF PRESENT ILLNESS: Eric Stokes is a 76 y.o. male with a diagnosis of prostate cancer. He has a longstanding history of BPH with BOO, elevated PSA and prostatitis/UTIs managed with 5 ARIs on and off over the years.  He was previously followed by Dr. Izora Gala and more recently has been followed by Dr. Junious Silk since 2018.  He was noted to have an elevated PSA of 8.64 in January 2021 although digital rectal examination remained normal without nodularity or concerning findings.  He was also noted to have some microhematuria at that time so a CT A/P was performed on 08/23/2019 showing no specific findings within the upper urinary tract to explain the microhematuria but there were multiple abnormal, enlarged bilateral pelvic sidewall, iliac and retroperitoneal lymph nodes that were nonspecific, although suspicious for metastatic disease and given the prostatic megaly, findings were concerning for prostate malignancy.  The patient proceeded to transrectal ultrasound with 12 biopsies of the prostate on 10/24/2019.  The prostate volume measured 106 cc.  Out of 12 core biopsies, 1 was positive.  The maximum Gleason score was 3+3, and this was seen in the right apex.  He was seen in the [facility] multidisciplinary prostate cancer clinic with a repeat CT A/P which showed decreased lymphadenopathy so the consensus was to proceed in active surveillance.  The PSA remained stable elevated between 3-6 on finasteride and most recently, on dutasteride for  the last several years. He had a surveillance MRI prostate in December 2022 which showed a PI-RADS 2 lesion throughout the peripheral zone at and PI-RADS 3 lesion in the right anterior peripheral zone at the apex but no high risk lesions identified and no lymphadenopathy or evidence of extracapsular extension. His most recent PSA was 2.29 (4.6 adjusted for dutasteride) on 12/05/2021.  A surveillance MRI fusion biopsy of the prostate was performed on 01/17/2022.  The prostate volume measured 67 to 99 g, depending on median lobe.  Out of 15 core biopsies, 3 were positive.  The maximum Gleason score was 3+4 and this was seen in the left base lateral and 2 of 2 cores from MRI ROI #2 in the right anterior peripheral zone at the apex.  Given the patient's persistent LUTS despite maximal medical therapy on Uroxatrol and dutasteride, the patient elected to proceed with a laser vaporization of the prostate on 04/15/2022, prior to proceeding with radiotherapy for treatment of his prostate cancer.  He has recovered well from this procedure and is noting gradual improvement in his LUTS.  The patient reviewed the biopsy results with his urologist and he has kindly been referred today for discussion of potential radiation treatment options.  His daughter, Eric Stokes, is a Geographical information systems officer NP with [employer] and would normally be here with him today for the consult visit but unfortunately, her husband and children have recently been diagnosed with COVID.   PREVIOUS RADIATION THERAPY: No  PAST MEDICAL HISTORY:  Past Medical History:  Diagnosis Date   Aortic stenosis, mild  last echo in epic 05-27-2020  ef 40-45%,  mean grandiant 87mHg, valve area 2.46 cm^2 ,  mild regurg   BPH with obstruction/lower urinary tract symptoms    urologist--- dr eskridge   Carotid bruit present 06/15/2018   2015 Carotid Dopplers:  Essentially normal carotid arteries, with very slight hard plaque in the proximal ICA's and serpentine distal vessels.  1-39% bilateral ICA stenosis. Patent vertebral arteries with antegrade flow. Normal subclavian arteries, bilaterally.   Chronic systolic CHF (congestive heart failure) [place] 05[place]   followed by dr bAve Filter  COPD ([place]ronic obstructive pulmonary disease) ([place]    Coronary artery disease 04/2020   cardiologist--- dr bHaroldine Laws   NSTEMI /  Afib w/ RVR/  Acute CHF/  cardiogenic shock 04-20-2020;  cath 05-17-2020 multivessel disease w/ critical LM;   05-18-2020 s/p CABG x4 and Maze procedure;  [place] no ischemia, ef 54%   Essential hypertension, benign 12/21/2008   Qualifier: Diagnosis of  By: HPercival Spanish MD, FFarrel Gordon    Glaucoma, both eyes    History of migraine    History of non-ST elevation myocardial infarction (NSTEMI) 05/17/2020   cardiogenic shock,  AF w/ RVR, acute CHF,  ICM;   s/p CABG w/ MAZE   History of squamous cell carcinoma of skin    History of TIA (transient ischemic attack) 02/28/2019   questionable TIA  per neurology note dr sLeonie Man 04-12-2019, likely migraine   Hx of colonic polyps 01/09/2020   Hyperlipidemia    Ischemic cardiomyopathy 04/2020   per cath 09-03-20212  ef 25-30%;   last echo in epic 05-27-2020  ef 40-45%   Malignant neoplasm prostate ([place] 10[place]   urologist--- dr eJunious Silk  dx 03/ 2021  active survillance   OA (osteoarthritis)    OSA on CPAP    [facility]    PAF ([place]oxysmal atrial fibrillation) ([place] 04/[place]   followed by cardiology---  s/p MAZE procedure 05-18-2020, takes ASA   PVC's (premature ventricular contractions)    S/P CABG x 4 05/18/2020   LIMA to LAD,  RIMA to PDA, SVG to OM1 and OM2   Seasonal allergic rhinitis    Sinus bradycardia    event monitor 01-29-2017  mild bradycardia, no pauses, rare PVCs   Vitamin B 12 deficiency    Vitamin D deficiency 11/26/2010   Wears hearing aid in both ears       PAST SURGICAL HISTORY: Past Surgical History:  Procedure Laterality Date    CLIPPING OF ATRIAL APPENDAGE Left 05/18/2020   Procedure: CLIPPING OF ATRIAL APPENDAGE USING ATRICURE 42MM ATRICLIP;  Surgeon: AWonda Olds MD;  [place] [facility]  Service: Open Heart Surgery;  Laterality: Left;   COLONOSCOPY     per pt last one approx 2019  by dr mCollene Mares  CORONARY ARTERY BYPASS GRAFT N/A 05/18/2020   Procedure: CORONARY ARTERY BYPASS GRAFTING (CABG) TIMES FOUR USING INTERNAL BILATERAL MAMMARY ARTERIES AND HARVESTED LEFT RADIAL ARTERY.;  Surgeon: AWonda Olds MD;  [place] [facility]  Service: Open Heart Surgery;  Laterality: N/A;  CORONARY ENDARTERECTOMY PERFORMED DURING SURGERY    IABP INSERTION N/A 05/17/2020   Procedure: IABP INSERTION;  Surgeon: BJolaine Artist MD;  [facility];  Service: Cardiovascular;  Laterality: N/A;   KNEE ARTHROPLASTY Right 03/14/2020   Procedure: COMPUTER ASSISTED TOTAL KNEE ARTHROPLASTY;  Surgeon: SRod Can MD;  [facility];  Service: Orthopedics;  Laterality: Right;   LAPAROSCOPIC  CHOLECYSTECTOMY  02/26/2000   '@[facility]'$   by dr Margot Chimes   MAZE N/A 05/18/2020   Procedure: MAZE USING ATRICURE ISOLATOR CLAMP OLL2;  Surgeon: Wonda Olds, MD;  Location: [place];  Service: Open Heart Surgery;  Laterality: N/A;   RADIAL ARTERY HARVEST Left 05/18/2020   Procedure: RADIAL ARTERY HARVEST;  Surgeon: Wonda Olds, MD;  Location: [place];  Service: Open Heart Surgery;  Laterality: Left;   RIGHT HEART CATH N/A 05/17/2020   Procedure: RIGHT HEART CATH;  Surgeon: Jolaine Artist, MD;  Location: [place] [facility];  Service: Cardiovascular;  Laterality: N/A;   RIGHT/LEFT HEART CATH AND CORONARY ANGIOGRAPHY N/A 05/17/2020   Procedure: RIGHT/LEFT HEART CATH AND CORONARY ANGIOGRAPHY;  Surgeon: Lorretta Harp, MD;  Location: [place] [facility];  Service: Cardiovascular;  Laterality: N/A;   TEE WITHOUT CARDIOVERSION N/A 05/18/2020   Procedure: TRANSESOPHAGEAL ECHOCARDIOGRAM (TEE);  Surgeon: Wonda Olds, MD;   Location: [place];  Service: Open Heart Surgery;  Laterality: N/A;   THULIUM LASER TURP (TRANSURETHRAL RESECTION OF PROSTATE) N/A 04/15/2022   Procedure: Marcelino Duster LASER VAPORIZATION OF PROSTATE;  Surgeon: Festus Aloe, MD;  Location: [facility];  Service: Urology;  Laterality: N/A;  34 MINS FOR CASE   TONSILLECTOMY AND ADENOIDECTOMY  1952   TRANSURETHRAL RESECTION OF PROSTATE N/A 04/15/2022    FAMILY HISTORY:  Family History  Problem Relation Age of Onset   Diabetes Mother    COPD Father    Stroke Paternal Grandfather     SOCIAL HISTORY:  Social History   Socioeconomic History   Marital status: Married    Spouse name: Not on file   Number of children: Not on file   Years of education: 13   Highest education level: Not on file  Occupational History   Occupation: retired  Tobacco Use   Smoking status: Former    Years: 3.00    Types: Cigarettes    Quit date: 1967    Years since quitting: 56.7   Smokeless tobacco: Never  Vaping Use   Vaping Use: Never used  Substance and Sexual Activity   Alcohol use: Yes    Alcohol/week: 2.0 - 6.0 standard drinks of alcohol    Types: 2 - 6 Cans of beer per week   Drug use: Never   Sexual activity: Yes  Other Topics Concern   Not on file  Social History Narrative   Is a [place] veteran, has trouble sleeping most nights. Often experiences nightmares w/anesthesia meds.   Social Determinants of Health   Financial Resource Strain: Low Risk  (03/05/2020)   Overall Financial Resource Strain (CARDIA)    Difficulty of Paying Living Expenses: Not hard at all  Food Insecurity: No Food Insecurity (03/05/2020)   Hunger Vital Sign    Worried About Running Out of Food in the Last Year: Never true    Ran Out of Food in the Last Year: Never true  Transportation Needs: No Transportation Needs (03/05/2020)   PRAPARE - Hydrologist (Medical): No    Lack of Transportation (Non-Medical): No  Physical  Activity: Unknown (03/05/2020)   Exercise Vital Sign    Days of Exercise per Week: 7 days    Minutes of Exercise per Session: Not on file  Stress: No Stress Concern Present (03/05/2020)   [place]    Feeling of Stress : Not at all  Social Connections: Moderately Isolated (03/05/2020)  Social Licensed conveyancer [NHANES]    Frequency of Communication with Friends and Family: More than three times a week    Frequency of Social Gatherings with Friends and Family: More than three times a week    Attends Religious Services: Never    Marine scientist or Organizations: No    Attends Archivist Meetings: Never    Marital Status: Married  Human resources officer Violence: Not At Risk (03/05/2020)   Humiliation, Afraid, Rape, and Kick questionnaire    Fear of Current or Ex-Partner: No    Emotionally Abused: No    Physically Abused: No    Sexually Abused: No    ALLERGIES: Flomax [tamsulosin], Cipro [ciprofloxacin hcl], Lipitor [atorvastatin], Lisinopril, and Spironolactone  MEDICATIONS:  Current Outpatient Medications  Medication Sig Dispense Refill   albuterol (VENTOLIN HFA) 108 (90 Base) MCG/ACT inhaler Inhale 1-2 puffs into the lungs 4 (four) times daily as needed.     alfuzosin (UROXATRAL) 10 MG 24 hr tablet Take 10 mg by mouth daily with breakfast.     aspirin 81 MG EC tablet Take 81 mg by mouth daily. Swallow whole.     carboxymethylcellulose (REFRESH PLUS) 0.5 % SOLN Place 1 drop into both eyes 2 (two) times daily as needed (dry eyes).     cetirizine (ZYRTEC) 10 MG tablet Take 10 mg by mouth daily.     Cholecalciferol (VITAMIN D3) 2000 UNITS TABS Take 2,000 Int'l Units by mouth daily.      dutasteride (AVODART) 0.5 MG capsule Take 0.5 mg by mouth daily.     fluticasone (FLONASE) 50 MCG/ACT nasal spray USE TWO SPRAYS IN EACH NOSTRIL DAILY AS NEEDED. 48 mL 0   ketotifen (ZADITOR) 0.025 % ophthalmic  solution Apply 1 drop to eye as needed.     latanoprost (XALATAN) 0.005 % ophthalmic solution INSTILL 1 DROP INTO BOTH EYES AT BEDTIME 11.25 mL 1   losartan (COZAAR) 25 MG tablet Take 0.5 tablets (12.5 mg total) by mouth at bedtime. 45 tablet 3   montelukast (SINGULAIR) 10 MG tablet TAKE 1 TABLET BY MOUTH EVERY DAY AT BEDTIME 90 tablet 3   rosuvastatin (CRESTOR) 20 MG tablet Take 1 tablet (20 mg total) by mouth daily. 90 tablet 3   sildenafil (VIAGRA) 100 MG tablet Take 0.5 tablets (50 mg total) by mouth daily as needed for erectile dysfunction. 20 tablet 1   ibuprofen (ADVIL) 400 MG tablet Take 1 tablet by mouth at bedtime. (Patient not taking: Reported on 05/07/2022)     NON FORMULARY Cpap at night     sulfamethoxazole-trimethoprim (BACTRIM DS) 800-160 MG tablet Take 1 tablet by mouth 2 (two) times daily.     No current facility-administered medications for this encounter.    REVIEW OF SYSTEMS:  On review of systems, the patient reports that he is doing well overall. He denies any chest pain, shortness of breath, cough, fevers, chills, night sweats, unintended weight changes. He denies any bowel disturbances, and denies abdominal pain, nausea or vomiting. He denies any new musculoskeletal or joint aches or pains. His IPSS was 22, indicating severe urinary symptoms. His SHIM was 14, indicating he has moderate erectile dysfunction. A complete review of systems is obtained and is otherwise negative.    PHYSICAL EXAM:  Wt Readings from Last 3 Encounters:  05/07/22 227 lb (103 kg)  04/29/22 228 lb 3.2 oz (103.5 kg)  04/24/22 225 lb (102.1 kg)   Temp Readings from Last 3 Encounters:  05/07/22 97.6 F (  36.4 C)  04/24/22 99.2 F (37.3 C) (Oral)  04/15/22 (!) 97.4 F (36.3 C)   BP Readings from Last 3 Encounters:  05/07/22 134/67  04/29/22 122/64  04/24/22 119/79   Pulse Readings from Last 3 Encounters:  05/07/22 73  04/29/22 64  04/24/22 83   Pain Assessment Pain Score: 0-No  pain/10  In general this is a well appearing Caucasian male in no acute distress. He's alert and oriented x4 and appropriate throughout the examination. Cardiopulmonary assessment is negative for acute distress, and he exhibits normal effort.     KPS = 100  100 - Normal; no complaints; no evidence of disease. 90   - Able to carry on normal activity; minor signs or symptoms of disease. 80   - Normal activity with effort; some signs or symptoms of disease. 66   - Cares for self; unable to carry on normal activity or to do active work. 60   - Requires occasional assistance, but is able to care for most of his personal needs. 50   - Requires considerable assistance and frequent medical care. 90   - Disabled; requires special care and assistance. 5   - Severely disabled; hospital admission is indicated although death not imminent. 66   - Very sick; hospital admission necessary; active supportive treatment necessary. 10   - Moribund; fatal processes progressing rapidly. 0     - Dead  Karnofsky DA, Abelmann Dunlap, Craver LS and Burchenal JH [phone]) The use of the nitrogen mustards in the palliative treatment of carcinoma: with particular reference to bronchogenic carcinoma Cancer 1 634-56  LABORATORY DATA:  Lab Results  Component Value Date   WBC 11.2 (H) 04/24/2022   HGB 13.1 04/24/2022   HCT 37.5 (L) 04/24/2022   MCV 89.3 04/24/2022   PLT 166 04/24/2022   Lab Results  Component Value Date   NA 137 04/24/2022   K 3.7 04/24/2022   CL 106 04/24/2022   CO2 22 04/24/2022   Lab Results  Component Value Date   ALT 20 07/03/2020   AST 20 07/03/2020   ALKPHOS 54 07/03/2020   BILITOT 0.7 07/03/2020     RADIOGRAPHY: CT Angio Chest PE W and/or Wo Contrast  Result Date: 04/24/2022 CLINICAL DATA:  Dizziness.  Fatigue. EXAM: CT ANGIOGRAPHY CHEST WITH CONTRAST TECHNIQUE: Multidetector CT imaging of the chest was performed using the standard protocol during bolus administration of intravenous  contrast. Multiplanar CT image reconstructions and MIPs were obtained to evaluate the vascular anatomy. RADIATION DOSE REDUCTION: This exam was performed according to the departmental dose-optimization program which includes automated exposure control, adjustment of the mA and/or kV according to patient size and/or use of iterative reconstruction technique. CONTRAST:  47m OMNIPAQUE IOHEXOL 350 MG/ML SOLN COMPARISON:  Chest CT 05/26/2020 FINDINGS: Cardiovascular: Thoracic aortic vascular calcifications. Cardiomegaly. No pericardial effusion. Coronary arterial vascular calcifications. Adequate opacification of the pulmonary arterial system. No evidence for acute pulmonary embolus. Left atrial clipping. Mediastinum/Nodes: No enlarged axillary, mediastinal or hilar lymphadenopathy. Normal appearance of the esophagus. Lungs/Pleura: Central airways are patent. Dependent atelectasis within the bilateral lower lobes. No pleural effusion or pneumothorax. Upper Abdomen: Stable low-attenuation lesions within the liver, likely hepatic cysts. No acute process. Musculoskeletal: Thoracic spine degenerative changes. No aggressive or acute appearing osseous lesions. Review of the MIP images confirms the above findings. IMPRESSION: No evidence for acute pulmonary embolus. No acute process in the chest. Aortic Atherosclerosis (ICD10-I70.0). Electronically Signed   By: DLovey NewcomerM.D.   On: 04/24/2022  19:34   DG Chest 2 View  Result Date: 04/24/2022 CLINICAL DATA:  Shortness of breath.  Cough and fatigue. EXAM: CHEST - 2 VIEW COMPARISON:  06/11/2020 FINDINGS: Median sternotomy and CABG with left atrial clipping. Stable heart size and mediastinal contours. There is no acute airspace disease, pulmonary edema, pleural effusion or pneumothorax. Thoracic spondylosis IMPRESSION: No acute chest findings. Electronically Signed   By: Keith Rake M.D.   On: 04/24/2022 18:39      IMPRESSION/PLAN: 1. 76 y.o. gentleman with Stage T1c  adenocarcinoma of the prostate with Gleason Score of 3+4, and PSA of 4.6. We discussed the patient's workup and outlined the nature of prostate cancer in this setting. The patient's T stage, Gleason's score, and PSA put him into the favorable intermediate risk group. Accordingly, he is eligible for a variety of potential treatment options including brachytherapy, 5.5 weeks of external radiation, or prostatectomy. We discussed the available radiation techniques, and focused on the details and logistics of delivery. The patient is not a candidate for brachytherapy boost having just recently had a laser vaporization of the prostate procedure for BPH with persistent LUTS refractory to medical therapy with dutasteride and Uroxatrol. We discussed and outlined the risks, benefits, short and long-term effects associated with radiotherapy and compared and contrasted these with prostatectomy. We discussed the role of SpaceOAR gel in reducing the rectal toxicity associated with radiotherapy. He was encouraged to ask questions that were answered to his stated satisfaction.  At the conclusion of our conversation, the patient is interested in moving forward with 5.5 weeks of external beam therapy but would prefer to postpone starting treatment until the first of the year which we feel is certainly reasonable. We will share our discussion with Dr. Junious Silk and make arrangements for fiducial markers and SpaceOAR gel placement in early January 2024, prior to simulation, to reduce rectal toxicity from radiotherapy. The patient appears to have a good understanding of his disease and our treatment recommendations which are of curative intent and is in agreement with the stated plan.  Therefore, we will move forward with treatment planning accordingly, in anticipation of beginning IMRT in early January 2024 per patient preference.  We enjoyed meeting him today and look forward to continuing to participate in his care.  We  personally spent 70 minutes in this encounter including chart review, reviewing radiological studies, meeting face-to-face with the patient, entering orders and completing documentation.    Nicholos Johns, PA-C    Tyler Pita, MD  [facility] Direct Dial: [phone]  Fax: [phone] [email or web address]  Skype  [email or web address]

## 2022-05-09 NOTE — Progress Notes (Signed)
RN spoke to patient's wife to introduce myself as the prostate nurse navigator.    Per wife, denies any barriers to care at this time.  Confirmed patient does want to wait until January to start his radiation treatment.    RN provided direct contact number and encouraged to reach out with any upcoming questions or concerns.   Verbalized understanding that we will be in contact closer to January to provide dates for treatment.

## 2022-05-15 ENCOUNTER — Other Ambulatory Visit (HOSPITAL_COMMUNITY): Payer: Self-pay | Admitting: Family Medicine

## 2022-06-06 ENCOUNTER — Other Ambulatory Visit (HOSPITAL_COMMUNITY): Payer: Self-pay | Admitting: Family Medicine

## 2022-06-26 ENCOUNTER — Other Ambulatory Visit: Payer: Self-pay | Admitting: Urology

## 2022-07-23 ENCOUNTER — Telehealth: Payer: Self-pay | Admitting: *Deleted

## 2022-07-23 NOTE — Telephone Encounter (Signed)
Returned patient's phone call, spoke with patient 

## 2022-08-22 ENCOUNTER — Other Ambulatory Visit: Payer: Self-pay

## 2022-08-22 ENCOUNTER — Encounter (HOSPITAL_BASED_OUTPATIENT_CLINIC_OR_DEPARTMENT_OTHER): Payer: Self-pay | Admitting: Urology

## 2022-08-22 NOTE — Progress Notes (Addendum)
Spoke w/ via phone for pre-op interview---Press Lab needs dos----ISTAT               Lab results------04/24/22 EKG in chart & Epic, 04/24/22 chest xray in [place] test -----patient states asymptomatic no test needed Arrive at -------0630 on Tuesday, 08/26/21 NPO after MN NO Solid Food.  Clear liquids from MN until---0530 Med rec completed Medications to take morning of surgery -----Avodart, Crestor Diabetic medication -----n/a Patient instructed no nail polish to be worn day of surgery Patient instructed to bring photo id and insurance card day of surgery Patient aware to have Driver (ride ) / caregiver    for 24 hours after surgery - wife Eric Stokes Patient Special Instructions -----Patient instructed to bring inhaler. Patient states he doesn't need inhaler and hasn't used in a long time. Do Fleet's enema the night before surgery. Pre-Op special Istructions -----none Patient verbalized understanding of instructions that were given at this phone interview. Patient denies shortness of breath, chest pain, fever, cough at this phone interview.  Patient has a hx of obesity, HTN, CAD/ NSTEMI/ Afib w/ RVR/ ICM (current ef 40 -45%) /acute HF in 04/2020, s/p CABG x $ and MAZE Procedure 05/2020, PAF, chronic systolic CHF, COPD, OSA (states uses CPAP nightly) As of 08/22/22, patient denies any chest pain, sob, or cardiac symptoms.  Cardiologist: Dr. Haroldine Laws, lov 09/26/21, [place] lov 04/29/22 with Kristopher Oppenheim, PA. Chest x ray: 04/24/22 in Epic EKG: 04/24/22 in EKG in chart & Epic Echo: 05/27/20 in Epic Sleep apnea/ CPAP: yes CT Angio: 04/24/22  Patient is on ASA 81 mg. Pt stated was given instructions from Dr. Junious Silk office to stop ASA 5 days prior  to surgery. Jeral Fruit, RN will  work-up further on Monday 08/25/21. Patient may need cardiac clearance and anesthesia review.

## 2022-08-25 NOTE — Progress Notes (Signed)
Patient was RadOnc consult on 9/20 for his stage T1c adenocarcinoma of the prostate with Gleason Score of 3+4, and PSA of 4.6.   Patient had decided to proceed with radiatio, but preferred to wait until early Jan.   RN spoke with patient to assess any navigation needs, and questions.   RN provided education on CT Simulation and next steps for daily radiation.   No additional needs at this time.  Plan of care in progress.

## 2022-08-25 NOTE — Progress Notes (Signed)
Addendum :  Reference to previous progress note on 08-22-2022 by Johnney Ou.  Reviewed w/ anesthesia, Dr Cheral Bay MDA, via phone.  Dr Cheral Bay stated ok to proceed.

## 2022-08-26 ENCOUNTER — Telehealth: Payer: Self-pay | Admitting: *Deleted

## 2022-08-26 ENCOUNTER — Encounter (HOSPITAL_BASED_OUTPATIENT_CLINIC_OR_DEPARTMENT_OTHER): Payer: Self-pay | Admitting: Urology

## 2022-08-26 ENCOUNTER — Other Ambulatory Visit: Payer: Self-pay

## 2022-08-26 ENCOUNTER — Ambulatory Visit (HOSPITAL_BASED_OUTPATIENT_CLINIC_OR_DEPARTMENT_OTHER): Payer: No Typology Code available for payment source | Admitting: Anesthesiology

## 2022-08-26 ENCOUNTER — Encounter (HOSPITAL_BASED_OUTPATIENT_CLINIC_OR_DEPARTMENT_OTHER)
Admission: RE | Disposition: A | Discharge status: Home / Self Care | Payer: Self-pay | Source: Home / Self Care | Attending: Urology

## 2022-08-26 ENCOUNTER — Ambulatory Visit (HOSPITAL_BASED_OUTPATIENT_CLINIC_OR_DEPARTMENT_OTHER)
Admission: RE | Admit: 2022-08-26 | Discharge: 2022-08-26 | Disposition: A | Discharge status: Home / Self Care | Payer: No Typology Code available for payment source | Attending: Urology | Admitting: Urology

## 2022-08-26 DIAGNOSIS — I2510 Atherosclerotic heart disease of native coronary artery without angina pectoris: Secondary | ICD-10-CM | POA: Insufficient documentation

## 2022-08-26 DIAGNOSIS — Z87891 Personal history of nicotine dependence: Secondary | ICD-10-CM

## 2022-08-26 DIAGNOSIS — I252 Old myocardial infarction: Secondary | ICD-10-CM | POA: Insufficient documentation

## 2022-08-26 DIAGNOSIS — I509 Heart failure, unspecified: Secondary | ICD-10-CM | POA: Insufficient documentation

## 2022-08-26 DIAGNOSIS — J449 Chronic obstructive pulmonary disease, unspecified: Secondary | ICD-10-CM

## 2022-08-26 DIAGNOSIS — G4730 Sleep apnea, unspecified: Secondary | ICD-10-CM | POA: Diagnosis not present

## 2022-08-26 DIAGNOSIS — R310 Gross hematuria: Secondary | ICD-10-CM | POA: Insufficient documentation

## 2022-08-26 DIAGNOSIS — G459 Transient cerebral ischemic attack, unspecified: Secondary | ICD-10-CM | POA: Insufficient documentation

## 2022-08-26 DIAGNOSIS — I110 Hypertensive heart disease with heart failure: Secondary | ICD-10-CM | POA: Insufficient documentation

## 2022-08-26 DIAGNOSIS — R3912 Poor urinary stream: Secondary | ICD-10-CM | POA: Insufficient documentation

## 2022-08-26 DIAGNOSIS — R3914 Feeling of incomplete bladder emptying: Secondary | ICD-10-CM | POA: Insufficient documentation

## 2022-08-26 DIAGNOSIS — N529 Male erectile dysfunction, unspecified: Secondary | ICD-10-CM | POA: Diagnosis not present

## 2022-08-26 DIAGNOSIS — Z951 Presence of aortocoronary bypass graft: Secondary | ICD-10-CM | POA: Diagnosis not present

## 2022-08-26 DIAGNOSIS — Z79899 Other long term (current) drug therapy: Secondary | ICD-10-CM | POA: Insufficient documentation

## 2022-08-26 DIAGNOSIS — N401 Enlarged prostate with lower urinary tract symptoms: Secondary | ICD-10-CM | POA: Insufficient documentation

## 2022-08-26 DIAGNOSIS — Z01818 Encounter for other preprocedural examination: Secondary | ICD-10-CM

## 2022-08-26 DIAGNOSIS — C61 Malignant neoplasm of prostate: Secondary | ICD-10-CM | POA: Diagnosis not present

## 2022-08-26 HISTORY — PX: SPACE OAR INSTILLATION: SHX6769

## 2022-08-26 HISTORY — PX: GOLD SEED IMPLANT: SHX6343

## 2022-08-26 LAB — POCT I-STAT, CHEM 8
BUN: 24 mg/dL — ABNORMAL HIGH (ref 8–23)
Calcium, Ion: 1.24 mmol/L (ref 1.15–1.40)
Chloride: 105 mmol/L (ref 98–111)
Creatinine, Ser: 1 mg/dL (ref 0.61–1.24)
Glucose, Bld: 98 mg/dL (ref 70–99)
HCT: 41 % (ref 39.0–52.0)
Hemoglobin: 13.9 g/dL (ref 13.0–17.0)
Potassium: 3.8 mmol/L (ref 3.5–5.1)
Sodium: 140 mmol/L (ref 135–145)
TCO2: 21 mmol/L — ABNORMAL LOW (ref 22–32)

## 2022-08-26 SURGERY — INSERTION, GOLD SEEDS
Anesthesia: Monitor Anesthesia Care | Site: Perineum

## 2022-08-26 MED ORDER — SODIUM CHLORIDE (PF) 0.9 % IJ SOLN
INTRAMUSCULAR | Status: DC | PRN
  Administered 2022-08-26: 10 mL

## 2022-08-26 MED ORDER — LIDOCAINE HCL (CARDIAC) PF 100 MG/5ML IV SOSY
PREFILLED_SYRINGE | INTRAVENOUS | Status: DC | PRN
  Administered 2022-08-26: 40 mg via INTRAVENOUS

## 2022-08-26 MED ORDER — MIDAZOLAM HCL 2 MG/2ML IJ SOLN
INTRAMUSCULAR | Status: DC | PRN
  Administered 2022-08-26: 1 mg via INTRAVENOUS

## 2022-08-26 MED ORDER — FENTANYL CITRATE (PF) 100 MCG/2ML IJ SOLN
INTRAMUSCULAR | Status: AC
  Filled 2022-08-26: qty 2

## 2022-08-26 MED ORDER — CEFAZOLIN SODIUM-DEXTROSE 2-4 GM/100ML-% IV SOLN
2.0000 g | INTRAVENOUS | Status: AC
  Administered 2022-08-26: 2 g via INTRAVENOUS

## 2022-08-26 MED ORDER — ACETAMINOPHEN 10 MG/ML IV SOLN: 1000.0000 mg | Freq: Once | INTRAVENOUS | Status: DC | PRN

## 2022-08-26 MED ORDER — PROPOFOL 10 MG/ML IV BOLUS
INTRAVENOUS | Status: DC | PRN
  Administered 2022-08-26: 20 mg via INTRAVENOUS

## 2022-08-26 MED ORDER — LACTATED RINGERS IV BOLUS
500.0000 mL | Freq: Once | INTRAVENOUS | Status: AC
  Administered 2022-08-26: 500 mL via INTRAVENOUS

## 2022-08-26 MED ORDER — PROPOFOL 500 MG/50ML IV EMUL
INTRAVENOUS | Status: DC | PRN
  Administered 2022-08-26: 150 ug/kg/min via INTRAVENOUS

## 2022-08-26 MED ORDER — PHENYLEPHRINE HCL (PRESSORS) 10 MG/ML IV SOLN
INTRAVENOUS | Status: DC | PRN
  Administered 2022-08-26 (×3): 80 ug via INTRAVENOUS

## 2022-08-26 MED ORDER — LACTATED RINGERS IV SOLN: INTRAVENOUS | Status: DC

## 2022-08-26 MED ORDER — FENTANYL CITRATE (PF) 100 MCG/2ML IJ SOLN
INTRAMUSCULAR | Status: DC | PRN
  Administered 2022-08-26 (×2): 25 ug via INTRAVENOUS

## 2022-08-26 MED ORDER — BUPIVACAINE HCL (PF) 0.25 % IJ SOLN
INTRAMUSCULAR | Status: DC | PRN
  Administered 2022-08-26: 10 mL

## 2022-08-26 MED ORDER — ONDANSETRON HCL 4 MG/2ML IJ SOLN: 4.0000 mg | Freq: Once | INTRAMUSCULAR | Status: DC | PRN

## 2022-08-26 MED ORDER — MIDAZOLAM HCL 2 MG/2ML IJ SOLN
INTRAMUSCULAR | Status: AC
  Filled 2022-08-26: qty 2

## 2022-08-26 MED ORDER — FENTANYL CITRATE (PF) 100 MCG/2ML IJ SOLN: 25.0000 ug | INTRAMUSCULAR | Status: DC | PRN

## 2022-08-26 MED ORDER — CEFAZOLIN SODIUM-DEXTROSE 2-4 GM/100ML-% IV SOLN
INTRAVENOUS | Status: AC
  Filled 2022-08-26: qty 100

## 2022-08-26 MED ORDER — EPHEDRINE SULFATE (PRESSORS) 50 MG/ML IJ SOLN
INTRAMUSCULAR | Status: DC | PRN
  Administered 2022-08-26: 10 mg via INTRAVENOUS

## 2022-08-26 MED ORDER — AMISULPRIDE (ANTIEMETIC) 5 MG/2ML IV SOLN: 10.0000 mg | Freq: Once | INTRAVENOUS | Status: DC | PRN

## 2022-08-26 SURGICAL SUPPLY — 25 items
BLADE CLIPPER SENSICLIP SURGIC (BLADE) ×1 IMPLANT
CNTNR URN SCR LID CUP LEK RST (MISCELLANEOUS) ×1 IMPLANT
CONT SPEC 4OZ STRL OR WHT (MISCELLANEOUS) ×1
COVER BACK TABLE 60X90IN (DRAPES) ×1 IMPLANT
DRSG TEGADERM 4X4.75 (GAUZE/BANDAGES/DRESSINGS) ×1 IMPLANT
DRSG TEGADERM 8X12 (GAUZE/BANDAGES/DRESSINGS) ×1 IMPLANT
GAUZE SPONGE 4X4 12PLY STRL (GAUZE/BANDAGES/DRESSINGS) ×1 IMPLANT
GLOVE BIO SURGEON STRL SZ7.5 (GLOVE) ×1 IMPLANT
GLOVE SURG ORTHO 8.5 STRL (GLOVE) ×2 IMPLANT
GLOVE SURG SS PI 6.5 STRL IVOR (GLOVE) IMPLANT
GLOVE SURG SS PI 7.5 STRL IVOR (GLOVE) IMPLANT
IMPL SPACEOAR VUE SYSTEM (Spacer) ×2 IMPLANT
IMPLANT SPACEOAR VUE SYSTEM (Spacer) ×1 IMPLANT
KIT TURNOVER CYSTO (KITS) ×1 IMPLANT
MARKER GOLD PRELOAD 1.2X3 (Urological Implant) ×2 IMPLANT
MARKER SKIN DUAL TIP RULER LAB (MISCELLANEOUS) ×1 IMPLANT
NDL SPNL 22GX3.5 QUINCKE BK (NEEDLE) ×2 IMPLANT
NEEDLE SPNL 22GX3.5 QUINCKE BK (NEEDLE) ×1 IMPLANT
SEED GOLD PRELOAD 1.2X3 (Urological Implant) ×1 IMPLANT
SHEATH ULTRASOUND LF (SHEATH) ×1 IMPLANT
SPIKE FLUID TRANSFER (MISCELLANEOUS) IMPLANT
SURGILUBE 2OZ TUBE FLIPTOP (MISCELLANEOUS) ×2 IMPLANT
SYR CONTROL 10ML LL (SYRINGE) ×2 IMPLANT
TOWEL OR 17X26 10 PK STRL BLUE (TOWEL DISPOSABLE) ×2 IMPLANT
UNDERPAD 30X36 HEAVY ABSORB (UNDERPADS AND DIAPERS) ×1 IMPLANT

## 2022-08-26 NOTE — Anesthesia Postprocedure Evaluation (Signed)
Anesthesia Post Note  Patient: Eric Stokes  Procedure(s) Performed: GOLD SEED IMPLANT (Perineum) SPACE OAR INSTILLATION (Perineum)     Patient location during evaluation: PACU Anesthesia Type: MAC Level of consciousness: awake Pain management: pain level controlled Vital Signs Assessment: post-procedure vital signs reviewed and stable Respiratory status: spontaneous breathing, nonlabored ventilation and respiratory function stable Cardiovascular status: blood pressure returned to baseline and stable Postop Assessment: no apparent nausea or vomiting Anesthetic complications: no   No notable events documented.  Last Vitals:  Vitals:   08/26/22 1030 08/26/22 1056  BP: 138/82 129/76  Pulse: (!) 52 (!) 54  Resp: 16 16  Temp:  (!) 36.1 C  SpO2: 99% 100%    Last Pain:  Vitals:   08/26/22 1056  TempSrc:   PainSc: 0-No pain                 Lalisa Kiehn P Nesa Distel

## 2022-08-26 NOTE — Telephone Encounter (Signed)
CALLED PATIENT TO REMIND OF SIM APPT. FOR 08-28-22- ARRIVAL TIME- 10:45 AM @ [facility], INFORMED PATIENT TO ARRIVE WITH A FULL BLADDER, LVM FOR A RETURN CALL

## 2022-08-26 NOTE — Op Note (Signed)
Operative Note   Preoperative diagnosis:  1.  Grade 2 prostate cancer   Postoperative diagnosis: 1.  Same    Procedure(s): 1.  Transrectal ultrasound guided prostatic gold seed fiducial marker and Space OAR placement   Surgeon: Ellison Hughs, MD   Assistants:  None    Anesthesia:  MAC   Complications:  None   EBL:  <5 mL   Specimens: 1. None   Drains/Catheters: 1.  None   Intraoperative findings:   Fiducial markers were placed at the prostatic base, mid-gland and apex.  SpaceOAR was placed with good separation of the prostate and rectum.    Indication: Eric Stokes is a 77 y.o. male with followed by Dr. Junious Silk with grade 2 prostate cancer.  He has elected to proceed with external beam radiation therapy as primary treatment of his prostate cancer.  He is here today for gold seed fiducial marker and SpaceOAR placement.  He has been consented for the above procedures, voices understanding and wishes to proceed.   Description of procedure:   After informed consent the patient was brought to the major OR, placed on the table and administered general anesthesia. He was then moved to the modified lithotomy position with his perineum perpendicular to the floor. His perineum and genitalia were then sterilely prepped. An official timeout was then performed.    Real time transrectal ultrasonography was used visualize the prostate.  Gold seed fiducial markers were then placed transperineally at the prostatic base, mid gland and apex.   I then proceeded with placement of SpaceOAR by introducing a needle with the bevel angled inferiorly approximately 2 cm superior to the anus. This was angled downward and under direct ultrasound was placed within the space between the prostatic capsule and rectum. This was confirmed with a small amount of sterile saline injected and this was performed under direct ultrasound. I then attached the SpaceOAR to the needle and injected this in the space  between the prostate and rectum with good placement noted.  The patient tolerated the procedure well and was transferred to the postanesthesia in stable condition.   Plan: Follow-up in 4 months with PSA

## 2022-08-26 NOTE — Anesthesia Preprocedure Evaluation (Addendum)
Anesthesia Evaluation  Patient identified by MRN, date of birth, ID band Patient awake    Reviewed: Allergy & Precautions, NPO status , Patient's Chart, lab work & pertinent test results  Airway Mallampati: II  TM Distance: >3 FB Neck ROM: Full    Dental no notable dental hx.    Pulmonary asthma , sleep apnea , COPD,  COPD inhaler, former smoker   Pulmonary exam normal        Cardiovascular hypertension, Pt. on medications + CAD, + Past MI, + CABG and +CHF  Normal cardiovascular exam+ dysrhythmias + Valvular Problems/Murmurs      Neuro/Psych TIA negative psych ROS   GI/Hepatic negative GI ROS, Neg liver ROS,,,  Endo/Other  negative endocrine ROS    Renal/GU negative Renal ROS     Musculoskeletal  (+) Arthritis ,    Abdominal  (+) + obese  Peds  Hematology negative hematology ROS (+)   Anesthesia Other Findings PROSTATE CANCER  Reproductive/Obstetrics                              Anesthesia Physical Anesthesia Plan  ASA: 3  Anesthesia Plan: MAC   Post-op Pain Management:    Induction: Intravenous  PONV Risk Score and Plan: 1 and Propofol infusion and Treatment may vary due to age or medical condition  Airway Management Planned: Simple Face Mask  Additional Equipment:   Intra-op Plan:   Post-operative Plan:   Informed Consent: I have reviewed the patients History and Physical, chart, labs and discussed the procedure including the risks, benefits and alternatives for the proposed anesthesia with the patient or authorized representative who has indicated his/her understanding and acceptance.     Dental advisory given  Plan Discussed with: CRNA  Anesthesia Plan Comments:         Anesthesia Quick Evaluation

## 2022-08-26 NOTE — H&P (Signed)
Office Visit Report     08/13/2022   --------------------------------------------------------------------------------   Marcello Moores L. Schurman  MRN: [id number]  DOB: July 30, 1946, 77 year old Male  PRIMARY CARE:    REFERRING:  Ammie Dalton, NP  PROVIDER:  Bjorn Loser, M.D.  TREATING:  Festus Aloe, M.D.  LOCATION:  [facility] [phone]     --------------------------------------------------------------------------------   CC/HPI: F/u -   1) BPH - 02/16/2012. On alfuzosin and on/off 5ari. Prostate 106 g and down to 76 g on CT 06/22 and 79 g on pMRI 12/22. TLVP in Aug 2023. AUASS was 12 in 2019. Not bothered. Weak stream and frequency. He was rx'd flomax but didn't want to take meds - continued surveillance.   He was seen July 2020 with frequency and dysuria. Urine culture grew Staph and he was treated with doxycycline. He started tamsulosin. His postvoid was up to 667 and a catheter was placed. He passed a voiding trial. He had severe dizziness and change to alfuzosin but even then cut it to 5 mg BID. Prostate ultrasound was recommended the guide therapy but he did not follow up. He tried to stop alfuzosin 5 mg BID but had a slow stream. Another cx grew Serratia and Staph sensitive to Bactrim and Cipro. He has frequency and urgency. UA with MH and wbc. PVR better at 124 cc.   He underwent CT 08/23/2019 with a 95 gram prostate with multiple abnormal, enlarged bilateral pelvic sidewall, iliac, and retroperitoneal lymph nodes, largest right pelvic sidewall lymph node measuring 1.5 x 1.3 cm (series 5, image 74, 77, 57). and benign. PSA was 8.64. Cysto benign - good Urolift or Rezum candidate. Prostate 106 g on TRUS 10/2019.   F/u PVR 162 ml. He had some mild dysuria penile irritation. UA 12/23/2019 and cx negative. UA clear. Started finasteride 05/21 with [facility] and they considered TURP. His PVR decreased to 87 ml. Pt has considered it and wants to continue finasteride and  alfuzosin BID and voiding better. He would consider but like to avoid BPH tx if possible. PVR 01/22, 115 ml. UA clear. He's lost 25 lbs since surgery (CABG) 10/21.   He's on alfuzosin now QD. He also takes finasteride, but stopped finasteride early 03/22. CT 06/22 with prostate 76 g. Breast tenderness better. No dysuria. Cysto 07/22 with long prostate channel (6-6.5 cm), no significant median lobe on cystoscopy. I kept him on nightly TMP x 45 days for bacteriuria causing some dysuria from 07/22 x 6 weeks. Dysuria and frequency returned. His PCP stopped jardiance. Stopped spironolactone. Asked about 5ari restart and rx dutasteride Oct 2022 which he started. Prostate was 79 g on MRI 12/22. He has some occasional dysuria.   Remains on dutasteride. AUASS = 14.    2) PCa - second opinion and management at [facility] and [facility].   Biopsy:  #17 Oct 2019 - Low risk:  PSA 8.64  T1c  Prostate 106 grams  Gleason 3+3=6, one core, 30%, right apex   #17 Jan 2022  PSA 2.29 (4.6)  T1c  Prostate 83 g (67-99 g +/- median lobe)  GG2 , left base (5%), one core  GG2 right anterior apical ROI (80-90%), two cores  3/14   Staging:  Jan 2021 CT - a 95 gram prostate with multiple abnormal, enlarged bilateral pelvic sidewall, iliac, and retroperitoneal lymph nodes, largest right pelvic sidewall lymph node measuring 1.5 x 1.3 cm (series 5, image 74, 77, 57). and benign.  Dec 2021 pMRI -  [place] - reported as normal and prostate 70 g  Jun 2022 CT - no LAD (likely above LAD reactive), no bone lesions - prostate 76 g after 5ari  Dec 2022 pMRI - a PIRADs 3 lesion right ant PZ, negative staging (pelvic lymph nodes noted to be 0.8 cm). Prostate 79 grams.   He went to [facility] clinic - CT reported as decreased LAD. They wanted to continue AS for a while and see him back at [facility] [place]. Started finasteride and considered TURP prior to Tx 05/21. PSA now 2.6 (5.2) on finasteride. He has considered it and wants to  continue AS for as long as possible. Otherwise he would consider BPH tx (if still needed) and then XRT.   PSA down to 1.5 (3) 01/22 on 5ari. He had CABG 10/21. Still sore and some breast tenderness (5ari vs sternotomy?). He reports 12/21 MRI showed "no imperfections" and prostate down to 70 grams. They will continue AS. He stopped 5ari earl 03/22 with PSA 2.13 (4.2). The breast tenderness settled down and testicles sore. However, he stopped spironolactone and breast tenderness improved. Asked about restart 5ari.   07/22 PSA 3.45 - off 5ari. His 09/22 PSA remains normal at 3.17 p abx. His was rx 5ari 10/22 and psa 2.6 12/22 and 2.29 (4.6) in Apr 2023.    3) ED - noticed trouble getting and maintaining erection since gradually since 2021. Good libido. In cardiac rehab and will ask cardiologist about pde5i. His cardiologist put him on sildenafil 100 mg which he can cut into 1/2s or 1/4s. His 07/22 T was 480.    4) gross hematuria - pt developed red/cranberry urine 05/22. No fever or dysuria. Intermittent blood this well. No clots. UA with many bacteria. Cx returned p he left + for enterobacter. Abx sent. 01/21 CT and cysto benign w/u for BPH.   Repeat CT 06/22 with BPH, bladder tic and BWT. LAD decreased (reactive). Prostate 76 g. Cysto 07/22 benign with cloudy urine. Last cx enterobacter and we think a chronic cystitis might be contributing to his LUTS and hematuria.    He returns in management of the above. He is voiding with a good stream. Still some frequency and urgency. He continues dutasteride and alfuzosin (tried to stop but restarted). PVR 125 ml. Some nights 1-2 some 3-4. He saw Rad Onc - they plan 5.5 weeks external beam. Pt has gold seed and spaceoar appt for 08/26/2022.     ALLERGIES: Cipro - knee swelling, joint pain finasteride - breast tenderness Lipitor TABS Lisinopril TABS Tamsulosin - Dizziness (Severe)    MEDICATIONS: Albuterol Sulfate Hfa 90 mcg hfa aerosol with adapter   Alfuzosin Hcl Er 10 mg tablet, extended release 24 hr  Cetirizine Hcl 10 mg tablet  Cpap  Fluticasone Propionate 50 mcg/actuation spray, suspension  Ketotifen Fumarate 0.025 % (0.035 %) drops  Latanoprost 0.005 % drops  Losartan Potassium 25 mg tablet  Montelukast Sodium 10 mg tablet  Rosuvastatin Calcium 20 mg tablet  Sildenafil Citrate 100 mg tablet     GU PSH: Cystoscopy - 02/22/2021, 2021 Laser Surgery Prostate - 04/15/2022 Locm 300-'399Mg'$ /Ml Iodine,1Ml - 2022, 2021 Prostate Needle Biopsy - 01/17/2022, 2021       PSH Notes: Cholecystectomy   NON-GU PSH: CABG (coronary artery bypass grafting) Cholecystectomy (open) - 2013 Surgical Pathology, Gross And Microscopic Examination For Prostate Needle - 01/17/2022, 2021 Total Knee Replacement, Right     GU PMH: Acute Cystitis/UTI - 05/20/2022, - 05/01/2022 BPH w/LUTS -  05/20/2022, - 05/01/2022, - 04/22/2022, Patient is here with his daughter that works over at [employer] and is a Designer, jewellery in Nutritional therapist. As far as BPH we went over the nature risk benefits and alternatives to thulium laser vaporization of the prostate. Given his cardiac history I do think there is a lower risk of bleeding with the laser although we discussed sometimes we have to convert to TURP. We will proceed with the laser. We also discussed extensively the timing of a BPH procedure versus radiation and vice versa. I discussed with the patient the nature r/b/a to laser vaporization of prostate including side effects of the procedure, expected post-op course and likelihood of success. We discussed flow symptoms and irritative symptoms typically improve, but frequency and urgency can persist and rarely worsen. We also discussed risk of bleeding, infection, stricture, sexual dysfunction and incontinence among others. We also discussed expectations for a staged or repeat procedure in the future. All questions answered. He elects to proceed. Given his cardiac history we will go ahead and  get cardiac clearance as well., - 03/31/2022, We discussed the nature r/b of surveillance, alpha blocker, 5ari, daily pdei, OAB meds and procedures such as TURP, LVP, LEP, RWJ, RSLP in the OR or PUL or WVT in the office. We will plan to set him up for aqua ablation and if not available for thulium laser vaporization of the prostate possible TURP. , - 01/23/2022, Disc meds and procedures. , - 12/12/2021, he wants to continue alf and dutasteride and "shrink" the prostate. Urine for cx. , - 08/23/2021, He asked about 5ari and would like to take a med prior to doing any procedures. Disc again nature r/b/a to 5ari and FDA warnings. Dutasteride rx sent. Can use finasteride. , - 05/23/2021, - 02/22/2021, disc again nature r/b/a to 5ari and FDA warnings. , - 2022, He is considering a prostate procedure. , - 2022, cont alf and finast , - 2022, Disc again nature r/b of alpha blocker and 5ari again. Will cont med therapy. , - 2021, will start finasteride. , - 2021, We also discussed all manner of BPH tx -- meds, procedures and went over the anatomy, nature r/b of each. He will consider and review with family. I worte these down in the PCa book. He has some frequency. , - 2021, - 2021, pvr better on alfuzosin 5 bid - continue. Consider 5ari or procedure if needed. , - 2021, - 2020, - 2020, - 2020, - 2020, - 2019, - 2018, Benign localized prostatic hyperplasia with lower urinary tract symptoms (LUTS), - 2017 Nocturia - 05/20/2022, - 2022, Nocturia, - 2017 Incomplete bladder emptying - 05/01/2022, - 04/22/2022, UA sent for urinalysis and culture., - 03/31/2022, - 01/23/2022, - 12/12/2021, - 08/23/2021, - 05/23/2021, - 02/22/2021, - 2022, - 2022, - 2020 Prostate Cancer, Given that his daughter is here for the first appointment we went over again his PSA levels, his grade stage and prognosis of both of his biopsies as well as his MRI results. She had the pathology printed. We went over the nature risk benefits and alternatives to radiation. We  discussed again continued active surveillance versus surgery for the prostate. All questions answered. I will refer him to Dr. Tammi Klippel for consultation. - 03/31/2022, I had a long discussion with the patient using the understanding prostate cancer booklet and his path report as a reference. We discussed his stage, grade and prognosis. We discussed the nature risks and benefits of active surveillance, radical prostatectomy, external beam radiotherapy,  and brachytherapy. We discussed the role of androgen deprivation and chemotherapy in prostate cancer. We also discussed other ablative techniques such as HiFU and cryotherapy as well as whole gland versus focal treatment. We discussed specifically how each treatment might affect bowel, bladder and sexual function. We discussed how each treatment might effect salvage treatments and active surveillance might lead to progression and more difficult treatment in the future. All questions answered. He has had progression of his grade to a slight degree as well as an increased percentage of cores. He is interested in radiation either external beam or brachy. Once we have dealt with the BPH we will reassess his PSA and then get him over to radiation oncology., - 01/23/2022, Tolerated well. 14 bx done. DRE nl. Prostate 83 g (67-99 g +/- median lobe), - 01/17/2022, We discussed the MRI and PSA levels. Discussed nature r/b/a to TRUS surveillance bx and he would like to proceed. He prefers the "pain of a biopsy" vs the "mental" anguish of AS. Once we get scheduled I'll send in diazepam and abx to [facility]. , - 12/12/2021, Disc MRI. Stable. Disc biopsy of prostate. He really favors AS. He would be interested in brachy if we can "shrink" the prostate. He felt the biopsy was "invasive" and wants to avoid. , - 08/23/2021, PSA remains low -, - 05/23/2021, PSA up off 5ari. CT with resolution of LAD. , - 02/22/2021, Check T, E and PSA off 5ari in 3 mo. We discussed the nature r/b/ of MRI vs bx.  , - 2022, breast tenderness - consider serm, aromatase inhibitor . Check PSA, T and E next time. Also, pt to go by and get MRI report from [place]., - 2022, MRI pending. Discussed again AS vs tx with XRT. He would like to continue AS for as long as possible. , - 2021, See back in 3 mo with a PSA , - 2021, I had a long discussion with the patient using the understanding prostate cancer booklet and his path report as a reference. We discussed his stage, grade and prognosis. We discussed the nature risks and benefits of active surveillance, radical prostatectomy, external beam radiotherapy, and brachytherapy. We discussed the role of androgen deprivation and chemotherapy in prostate cancer. We also discussed other ablative techniques such as HiFU and cryotherapy as well as whole gland versus focal treatment. We discussed specifically how each treatment might affect bowel, bladder and sexual function. We discussed how each treatment might effect salvage treatments and active surveillance might lead to progression and more difficult treatment in the future. All questions answered. Will continue surveillance of PSA and consider further CT in future (LAD) and possibly a PET scan. We talked about the LAD doesn't correlate with what we are seeing with the PSAD and Gleason grade. , - 2021 Urinary Frequency, urine for cx = consider another round of tmp - 05/23/2021, - 2021, - 2018 Gross hematuria, I wonder if a chronic cystitis is contributing - I gave bactrim BID today and will start nightly TMP. - 02/22/2021, - 2022, sent Bun and Cr. Check CT scan and cystoscopy. Urine cx pending. , - 2022 ED due to arterial insufficiency, DIscussed again nature r/b/a to pde5i and sildenafil dosing. - 2022, Labs as above. Disc nature r/b/a to pde5i. , - 2022 Microscopic hematuria - 2020 Urinary Retention - 2020 Elevated PSA - 2018, Elevated prostate specific antigen (PSA), - 2015 Urinary Tract Inf, Unspec site, Urinary tract infection -  2016    NON-GU PMH:  Localized enlarged lymph nodes (Stable), CT will recheck the nodes. Pt reports [facility] had done this but now "canceled all his appts". - 2022, as above. , - 2021 Encounter for general adult medical examination without abnormal findings, Encounter for preventive health examination - 2017 Personal history of other diseases of the circulatory system, History of hypertension - 2014 Personal history of other endocrine, nutritional and metabolic disease, History of hypercholesterolemia - 2014    FAMILY HISTORY: Father Deceased At Age37 ___ - [place] In Family Mother Deceased At Age 50 from diabetic complicati - [place] In Family No pertinent family history - Other   SOCIAL HISTORY: Marital Status: Married Preferred Language: English; Ethnicity: Not Hispanic Or Latino; Race: White Current Smoking Status: Patient does not smoke anymore.   Tobacco Use Assessment Completed: Used Tobacco in last 30 days? Light Drinker.  Drinks 2 caffeinated drinks per day.     Notes: Former smoker, Tobacco use, Alcohol Use, Marital History - Currently Married, Caffeine Use   REVIEW OF SYSTEMS:    GU Review Male:   Patient reports frequent urination, hard to postpone urination, burning/ pain with urination, get up at night to urinate, and leakage of urine. Patient denies stream starts and stops, trouble starting your stream, have to strain to urinate , erection problems, and penile pain.  Gastrointestinal (Upper):   Patient denies nausea, vomiting, and indigestion/ heartburn.  Gastrointestinal (Lower):   Patient denies diarrhea and constipation.  Constitutional:   Patient denies fever, night sweats, weight loss, and fatigue.  Skin:   Patient denies itching and skin rash/ lesion.  Eyes:   Patient denies blurred vision and double vision.  Ears/ Nose/ Throat:   Patient denies sore throat and sinus problems.  Hematologic/Lymphatic:   Patient denies swollen glands and easy bruising.  Cardiovascular:    Patient denies leg swelling and chest pains.  Respiratory:   Patient denies cough and shortness of breath.  Endocrine:   Patient denies excessive thirst.  Musculoskeletal:   Patient reports joint pain. Patient denies back pain.  Neurological:   Patient denies headaches and dizziness.  Psychologic:   Patient denies depression and anxiety.   VITAL SIGNS: None   MULTI-SYSTEM PHYSICAL EXAMINATION:    Constitutional: Well-nourished. No physical deformities. Normally developed. Good grooming.  Neck: Neck symmetrical, not swollen. Normal tracheal position.  Respiratory: No labored breathing, no use of accessory muscles.   Cardiovascular: Normal temperature, normal extremity pulses, no swelling, no varicosities.  Skin: No paleness, no jaundice, no cyanosis. No lesion, no ulcer, no rash.  Neurologic / Psychiatric: Oriented to time, oriented to place, oriented to person. No depression, no anxiety, no agitation.  Gastrointestinal: No mass, no tenderness, no rigidity, non obese abdomen.     Complexity of Data:  Lab Test Review:   Path Report  Records Review:   Previous Doctor Records   12/05/21 08/16/21 05/16/21 02/15/21 11/15/20 08/23/20 04/17/20 09/08/19  PSA  Total PSA 2.29 ng/mL 2.64 ng/mL 3.17 ng/mL 3.45 ng/mL 2.13 ng/mL 1.51 ng/mL 2.60 ng/mL 8.64 ng/mL  Free PSA        0.93 ng/mL  % Free PSA        11 % PSA    02/15/21  Hormones  Testosterone, Total 479.9 ng/dL   Notes:                     rad onc note    PROCEDURES:         PVR Ultrasound - 60737  Scanned Volume:  125 cc         Urinalysis w/Scope Dipstick Dipstick Cont'd Micro  Color: Yellow Bilirubin: Neg mg/dL WBC/hpf: 0 - 5/hpf  Appearance: Turbid Ketones: Neg mg/dL RBC/hpf: 0 - 2/hpf  Specific Gravity: 1.030 Blood: Neg ery/uL Bacteria: Few (10-25/hpf)  pH: <=5.0 Protein: Neg mg/dL Cystals: NS (Not Seen)  Glucose: Neg mg/dL Urobilinogen: 0.2 mg/dL Casts: NS (Not Seen)    Nitrites: Neg Trichomonas: Not Present    Leukocyte  Esterase: Neg leu/uL Mucous: Present      Epithelial Cells: 10 - 20/hpf      Yeast: NS (Not Seen)      Sperm: Not Present    ASSESSMENT:      ICD-10 Details  1 GU:   BPH w/LUTS - N40.1 Chronic, Stable - cont meds for now . Mild dysuria. I sent for cx   2   Incomplete bladder emptying - R39.14 Chronic, Stable  3   Prostate Cancer - C61 Chronic, Stable - PSA was sent. See back in 3 mo with PSA prior.   I drew him a picture of the anatomy and the nature r/b/a to gold seed and spaceoar gel. Also disc enema and a colleague likely doing the case.    PLAN:           Orders Labs Urine Culture, PSA          Schedule Labs: 3 Months - PSA  Return Visit/Planned Activity: 3 Months - Office Visit, Follow up MD          Document Letter(s):  Created for Patient: Clinical Summary         Next Appointment:      Next Appointment: 08/26/2022 09:30 AM    Appointment Type: Surgery     Location: [facility] [phone] [id number]    Provider: Ellison Hughs, M.D.    Reason for Visit: OP NE FIDUCIAL MARKERS SPACE OAR    The risk, benefits and alternatives of gold seed fiducial marker and SpaceOAR placement was discussed with the patient.  Risk include, but are not limited to, bleeding, urinary tract infection, perineal infection, urethral injury, rectal irritation/ulceration, MI, CVA, DVT and the inherent risk of general anesthesia.  He voices understanding and wishes to proceed.

## 2022-08-26 NOTE — Discharge Instructions (Signed)

## 2022-08-26 NOTE — Transfer of Care (Signed)
Immediate Anesthesia Transfer of Care Note  Patient: Eric Stokes  Procedure(s) Performed: GOLD SEED IMPLANT (Perineum) SPACE OAR INSTILLATION (Perineum)  Patient Location: PACU  Anesthesia Type:MAC  Level of Consciousness: drowsy and patient cooperative  Airway & Oxygen Therapy: Patient Spontanous Breathing and aerosol face mask  Post-op Assessment: Report given to RN and Post -op Vital signs reviewed and stable  Post vital signs: Reviewed and stable  Last Vitals:  Vitals Value Taken Time  BP    Temp    Pulse 71 08/26/22 0907  Resp 15 08/26/22 0907  SpO2 96 % 08/26/22 0907  Vitals shown include unvalidated device data.  Last Pain:  Vitals:   08/26/22 0654  TempSrc: Oral  PainSc: 3       Patients Stated Pain Goal: 5 (17/51/02 5852)  Complications: No notable events documented.

## 2022-08-27 ENCOUNTER — Encounter (HOSPITAL_BASED_OUTPATIENT_CLINIC_OR_DEPARTMENT_OTHER): Payer: Self-pay | Admitting: Urology

## 2022-08-27 NOTE — Progress Notes (Signed)
  Radiation Oncology         [phone] ________________________________  Name: Eric Stokes MRN: [id number]  Date: 08/28/2022  DOB: 13-Apr-1946  SIMULATION AND TREATMENT PLANNING NOTE    ICD-10-CM   1. Malignant neoplasm of prostate ([place])  C61       DIAGNOSIS:  77 y.o. gentleman with Stage T1c adenocarcinoma of the prostate with Gleason score of 3+4, and PSA of 4.6.   NARRATIVE:  The patient was brought to the [place].  Identity was confirmed.  All relevant records and images related to the planned course of therapy were reviewed.  The patient freely provided informed written consent to proceed with treatment after reviewing the details related to the planned course of therapy. The consent form was witnessed and verified by the simulation staff.  Then, the patient was set-up in a stable reproducible supine position for radiation therapy.  A vacuum lock pillow device was custom fabricated to position his legs in a reproducible immobilized position.  Then, I performed a urethrogram under sterile conditions to identify the prostatic apex.  CT images were obtained.  Surface markings were placed.  The CT images were loaded into the planning software.  Then the prostate target and avoidance structures including the rectum, bladder, bowel and hips were contoured.  Treatment planning then occurred.  The radiation prescription was entered and confirmed.  A total of one complex treatment devices was fabricated. I have requested : Intensity Modulated Radiotherapy (IMRT) is medically necessary for this case for the following reason:  Rectal sparing.Marland Kitchen  PLAN:  The patient will receive 70 Gy in 28 fractions.  ________________________________  Sheral Apley Tammi Klippel, M.D.

## 2022-08-28 ENCOUNTER — Ambulatory Visit
Admission: RE | Admit: 2022-08-28 | Discharge: 2022-08-28 | Disposition: A | Discharge status: Home / Self Care | Payer: No Typology Code available for payment source | Source: Ambulatory Visit | Attending: Radiation Oncology | Admitting: Radiation Oncology

## 2022-08-28 ENCOUNTER — Other Ambulatory Visit: Payer: Self-pay

## 2022-08-28 DIAGNOSIS — C61 Malignant neoplasm of prostate: Secondary | ICD-10-CM | POA: Diagnosis present

## 2022-08-28 DIAGNOSIS — Z510 Encounter for antineoplastic radiation therapy: Secondary | ICD-10-CM | POA: Diagnosis present

## 2022-09-08 ENCOUNTER — Ambulatory Visit
Admission: RE | Admit: 2022-09-08 | Discharge: 2022-09-08 | Disposition: A | Discharge status: Home / Self Care | Payer: No Typology Code available for payment source | Source: Ambulatory Visit | Attending: Radiation Oncology | Admitting: Radiation Oncology

## 2022-09-08 ENCOUNTER — Other Ambulatory Visit: Payer: Self-pay

## 2022-09-08 DIAGNOSIS — Z510 Encounter for antineoplastic radiation therapy: Secondary | ICD-10-CM | POA: Diagnosis not present

## 2022-09-08 DIAGNOSIS — C61 Malignant neoplasm of prostate: Secondary | ICD-10-CM

## 2022-09-08 LAB — RAD ONC ARIA SESSION SUMMARY
Course Elapsed Days: 0
Plan Fractions Treated to Date: 1
Plan Prescribed Dose Per Fraction: 2.5 Gy
Plan Total Fractions Prescribed: 28
Plan Total Prescribed Dose: 70 Gy
Reference Point Dosage Given to Date: 2.5 Gy
Reference Point Session Dosage Given: 2.5 Gy
Session Number: 1

## 2022-09-09 ENCOUNTER — Other Ambulatory Visit: Payer: Self-pay

## 2022-09-09 ENCOUNTER — Ambulatory Visit
Admission: RE | Admit: 2022-09-09 | Discharge: 2022-09-09 | Disposition: A | Discharge status: Home / Self Care | Payer: No Typology Code available for payment source | Source: Ambulatory Visit | Attending: Radiation Oncology | Admitting: Radiation Oncology

## 2022-09-09 DIAGNOSIS — Z510 Encounter for antineoplastic radiation therapy: Secondary | ICD-10-CM | POA: Diagnosis not present

## 2022-09-09 LAB — RAD ONC ARIA SESSION SUMMARY
Course Elapsed Days: 1
Plan Fractions Treated to Date: 2
Plan Prescribed Dose Per Fraction: 2.5 Gy
Plan Total Fractions Prescribed: 28
Plan Total Prescribed Dose: 70 Gy
Reference Point Dosage Given to Date: 5 Gy
Reference Point Session Dosage Given: 2.5 Gy
Session Number: 2

## 2022-09-10 ENCOUNTER — Ambulatory Visit
Admission: RE | Admit: 2022-09-10 | Discharge: 2022-09-10 | Disposition: A | Discharge status: Home / Self Care | Payer: No Typology Code available for payment source | Source: Ambulatory Visit | Attending: Radiation Oncology | Admitting: Radiation Oncology

## 2022-09-10 ENCOUNTER — Other Ambulatory Visit: Payer: Self-pay

## 2022-09-10 DIAGNOSIS — Z510 Encounter for antineoplastic radiation therapy: Secondary | ICD-10-CM | POA: Diagnosis not present

## 2022-09-10 LAB — RAD ONC ARIA SESSION SUMMARY
Course Elapsed Days: 2
Plan Fractions Treated to Date: 3
Plan Prescribed Dose Per Fraction: 2.5 Gy
Plan Total Fractions Prescribed: 28
Plan Total Prescribed Dose: 70 Gy
Reference Point Dosage Given to Date: 7.5 Gy
Reference Point Session Dosage Given: 2.5 Gy
Session Number: 3

## 2022-09-11 ENCOUNTER — Other Ambulatory Visit: Payer: Self-pay

## 2022-09-11 ENCOUNTER — Ambulatory Visit
Admission: RE | Admit: 2022-09-11 | Discharge: 2022-09-11 | Disposition: A | Discharge status: Home / Self Care | Payer: No Typology Code available for payment source | Source: Ambulatory Visit | Attending: Radiation Oncology | Admitting: Radiation Oncology

## 2022-09-11 DIAGNOSIS — Z510 Encounter for antineoplastic radiation therapy: Secondary | ICD-10-CM | POA: Diagnosis not present

## 2022-09-11 LAB — RAD ONC ARIA SESSION SUMMARY
Course Elapsed Days: 3
Plan Fractions Treated to Date: 4
Plan Prescribed Dose Per Fraction: 2.5 Gy
Plan Total Fractions Prescribed: 28
Plan Total Prescribed Dose: 70 Gy
Reference Point Dosage Given to Date: 10 Gy
Reference Point Session Dosage Given: 2.5 Gy
Session Number: 4

## 2022-09-12 ENCOUNTER — Ambulatory Visit
Admission: RE | Admit: 2022-09-12 | Discharge: 2022-09-12 | Disposition: A | Discharge status: Home / Self Care | Payer: No Typology Code available for payment source | Source: Ambulatory Visit | Attending: Radiation Oncology | Admitting: Radiation Oncology

## 2022-09-12 ENCOUNTER — Encounter: Payer: Self-pay | Admitting: Radiation Oncology

## 2022-09-12 ENCOUNTER — Other Ambulatory Visit: Payer: Self-pay

## 2022-09-12 DIAGNOSIS — Z510 Encounter for antineoplastic radiation therapy: Secondary | ICD-10-CM | POA: Diagnosis not present

## 2022-09-12 LAB — RAD ONC ARIA SESSION SUMMARY
Course Elapsed Days: 4
Plan Fractions Treated to Date: 5
Plan Prescribed Dose Per Fraction: 2.5 Gy
Plan Total Fractions Prescribed: 28
Plan Total Prescribed Dose: 70 Gy
Reference Point Dosage Given to Date: 12.5 Gy
Reference Point Session Dosage Given: 2.5 Gy
Session Number: 5

## 2022-09-15 ENCOUNTER — Other Ambulatory Visit: Payer: Self-pay

## 2022-09-15 ENCOUNTER — Ambulatory Visit
Admission: RE | Admit: 2022-09-15 | Discharge: 2022-09-15 | Disposition: A | Discharge status: Home / Self Care | Payer: No Typology Code available for payment source | Source: Ambulatory Visit | Attending: Radiation Oncology | Admitting: Radiation Oncology

## 2022-09-15 DIAGNOSIS — Z510 Encounter for antineoplastic radiation therapy: Secondary | ICD-10-CM | POA: Diagnosis not present

## 2022-09-15 LAB — RAD ONC ARIA SESSION SUMMARY
Course Elapsed Days: 7
Plan Fractions Treated to Date: 6
Plan Prescribed Dose Per Fraction: 2.5 Gy
Plan Total Fractions Prescribed: 28
Plan Total Prescribed Dose: 70 Gy
Reference Point Dosage Given to Date: 15 Gy
Reference Point Session Dosage Given: 2.5 Gy
Session Number: 6

## 2022-09-16 ENCOUNTER — Other Ambulatory Visit: Payer: Self-pay

## 2022-09-16 ENCOUNTER — Ambulatory Visit
Admission: RE | Admit: 2022-09-16 | Discharge: 2022-09-16 | Disposition: A | Discharge status: Home / Self Care | Payer: No Typology Code available for payment source | Source: Ambulatory Visit | Attending: Radiation Oncology | Admitting: Radiation Oncology

## 2022-09-16 DIAGNOSIS — Z510 Encounter for antineoplastic radiation therapy: Secondary | ICD-10-CM | POA: Diagnosis not present

## 2022-09-16 LAB — RAD ONC ARIA SESSION SUMMARY
Course Elapsed Days: 8
Plan Fractions Treated to Date: 7
Plan Prescribed Dose Per Fraction: 2.5 Gy
Plan Total Fractions Prescribed: 28
Plan Total Prescribed Dose: 70 Gy
Reference Point Dosage Given to Date: 17.5 Gy
Reference Point Session Dosage Given: 2.5 Gy
Session Number: 7

## 2022-09-17 ENCOUNTER — Ambulatory Visit
Admission: RE | Admit: 2022-09-17 | Discharge: 2022-09-17 | Disposition: A | Discharge status: Home / Self Care | Payer: No Typology Code available for payment source | Source: Ambulatory Visit | Attending: Radiation Oncology | Admitting: Radiation Oncology

## 2022-09-17 ENCOUNTER — Other Ambulatory Visit: Payer: Self-pay

## 2022-09-17 DIAGNOSIS — Z510 Encounter for antineoplastic radiation therapy: Secondary | ICD-10-CM | POA: Diagnosis not present

## 2022-09-17 LAB — RAD ONC ARIA SESSION SUMMARY
Course Elapsed Days: 9
Plan Fractions Treated to Date: 8
Plan Prescribed Dose Per Fraction: 2.5 Gy
Plan Total Fractions Prescribed: 28
Plan Total Prescribed Dose: 70 Gy
Reference Point Dosage Given to Date: 20 Gy
Reference Point Session Dosage Given: 2.5 Gy
Session Number: 8

## 2022-09-18 ENCOUNTER — Ambulatory Visit: Payer: No Typology Code available for payment source

## 2022-09-19 ENCOUNTER — Ambulatory Visit
Admission: RE | Admit: 2022-09-19 | Discharge: 2022-09-19 | Disposition: A | Discharge status: Home / Self Care | Payer: No Typology Code available for payment source | Source: Ambulatory Visit | Attending: Radiation Oncology | Admitting: Radiation Oncology

## 2022-09-19 ENCOUNTER — Other Ambulatory Visit: Payer: Self-pay

## 2022-09-19 ENCOUNTER — Encounter: Payer: Self-pay | Admitting: Radiation Oncology

## 2022-09-19 DIAGNOSIS — C61 Malignant neoplasm of prostate: Secondary | ICD-10-CM | POA: Insufficient documentation

## 2022-09-19 DIAGNOSIS — Z510 Encounter for antineoplastic radiation therapy: Secondary | ICD-10-CM | POA: Diagnosis present

## 2022-09-19 LAB — RAD ONC ARIA SESSION SUMMARY
Course Elapsed Days: 11
Plan Fractions Treated to Date: 9
Plan Prescribed Dose Per Fraction: 2.5 Gy
Plan Total Fractions Prescribed: 28
Plan Total Prescribed Dose: 70 Gy
Reference Point Dosage Given to Date: 22.5 Gy
Reference Point Session Dosage Given: 2.5 Gy
Session Number: 9

## 2022-09-22 ENCOUNTER — Ambulatory Visit
Admission: RE | Admit: 2022-09-22 | Discharge: 2022-09-22 | Disposition: A | Discharge status: Home / Self Care | Payer: No Typology Code available for payment source | Source: Ambulatory Visit | Attending: Radiation Oncology | Admitting: Radiation Oncology

## 2022-09-22 ENCOUNTER — Other Ambulatory Visit: Payer: Self-pay

## 2022-09-22 DIAGNOSIS — Z510 Encounter for antineoplastic radiation therapy: Secondary | ICD-10-CM | POA: Diagnosis not present

## 2022-09-22 LAB — RAD ONC ARIA SESSION SUMMARY
Course Elapsed Days: 14
Plan Fractions Treated to Date: 10
Plan Prescribed Dose Per Fraction: 2.5 Gy
Plan Total Fractions Prescribed: 28
Plan Total Prescribed Dose: 70 Gy
Reference Point Dosage Given to Date: 25 Gy
Reference Point Session Dosage Given: 2.5 Gy
Session Number: 10

## 2022-09-23 ENCOUNTER — Other Ambulatory Visit: Payer: Self-pay

## 2022-09-23 ENCOUNTER — Ambulatory Visit
Admission: RE | Admit: 2022-09-23 | Discharge: 2022-09-23 | Disposition: A | Discharge status: Home / Self Care | Payer: No Typology Code available for payment source | Source: Ambulatory Visit | Attending: Radiation Oncology | Admitting: Radiation Oncology

## 2022-09-23 DIAGNOSIS — Z510 Encounter for antineoplastic radiation therapy: Secondary | ICD-10-CM | POA: Diagnosis not present

## 2022-09-23 LAB — RAD ONC ARIA SESSION SUMMARY
Course Elapsed Days: 15
Plan Fractions Treated to Date: 11
Plan Prescribed Dose Per Fraction: 2.5 Gy
Plan Total Fractions Prescribed: 28
Plan Total Prescribed Dose: 70 Gy
Reference Point Dosage Given to Date: 27.5 Gy
Reference Point Session Dosage Given: 2.5 Gy
Session Number: 11

## 2022-09-24 ENCOUNTER — Other Ambulatory Visit: Payer: Self-pay

## 2022-09-24 ENCOUNTER — Ambulatory Visit
Admission: RE | Admit: 2022-09-24 | Discharge: 2022-09-24 | Disposition: A | Discharge status: Home / Self Care | Payer: No Typology Code available for payment source | Source: Ambulatory Visit | Attending: Radiation Oncology | Admitting: Radiation Oncology

## 2022-09-24 DIAGNOSIS — Z510 Encounter for antineoplastic radiation therapy: Secondary | ICD-10-CM | POA: Diagnosis not present

## 2022-09-24 LAB — RAD ONC ARIA SESSION SUMMARY
Course Elapsed Days: 16
Plan Fractions Treated to Date: 12
Plan Prescribed Dose Per Fraction: 2.5 Gy
Plan Total Fractions Prescribed: 28
Plan Total Prescribed Dose: 70 Gy
Reference Point Dosage Given to Date: 30 Gy
Reference Point Session Dosage Given: 2.5 Gy
Session Number: 12

## 2022-09-25 ENCOUNTER — Ambulatory Visit
Admission: RE | Admit: 2022-09-25 | Discharge: 2022-09-25 | Disposition: A | Discharge status: Home / Self Care | Payer: No Typology Code available for payment source | Source: Ambulatory Visit | Attending: Radiation Oncology | Admitting: Radiation Oncology

## 2022-09-25 ENCOUNTER — Other Ambulatory Visit: Payer: Self-pay

## 2022-09-25 ENCOUNTER — Encounter: Payer: Self-pay | Admitting: Radiation Oncology

## 2022-09-25 DIAGNOSIS — Z510 Encounter for antineoplastic radiation therapy: Secondary | ICD-10-CM | POA: Diagnosis not present

## 2022-09-25 LAB — RAD ONC ARIA SESSION SUMMARY
Course Elapsed Days: 17
Plan Fractions Treated to Date: 13
Plan Prescribed Dose Per Fraction: 2.5 Gy
Plan Total Fractions Prescribed: 28
Plan Total Prescribed Dose: 70 Gy
Reference Point Dosage Given to Date: 32.5 Gy
Reference Point Session Dosage Given: 2.5 Gy
Session Number: 13

## 2022-09-26 ENCOUNTER — Ambulatory Visit
Admission: RE | Admit: 2022-09-26 | Discharge: 2022-09-26 | Disposition: A | Discharge status: Home / Self Care | Payer: No Typology Code available for payment source | Source: Ambulatory Visit | Attending: Radiation Oncology | Admitting: Radiation Oncology

## 2022-09-26 ENCOUNTER — Other Ambulatory Visit: Payer: Self-pay

## 2022-09-26 DIAGNOSIS — Z510 Encounter for antineoplastic radiation therapy: Secondary | ICD-10-CM | POA: Diagnosis not present

## 2022-09-26 LAB — RAD ONC ARIA SESSION SUMMARY
Course Elapsed Days: 18
Plan Fractions Treated to Date: 14
Plan Prescribed Dose Per Fraction: 2.5 Gy
Plan Total Fractions Prescribed: 28
Plan Total Prescribed Dose: 70 Gy
Reference Point Dosage Given to Date: 35 Gy
Reference Point Session Dosage Given: 2.5 Gy
Session Number: 14

## 2022-09-29 ENCOUNTER — Ambulatory Visit
Admission: RE | Admit: 2022-09-29 | Discharge: 2022-09-29 | Disposition: A | Discharge status: Home / Self Care | Payer: No Typology Code available for payment source | Source: Ambulatory Visit | Attending: Radiation Oncology | Admitting: Radiation Oncology

## 2022-09-29 ENCOUNTER — Other Ambulatory Visit: Payer: Self-pay

## 2022-09-29 DIAGNOSIS — Z510 Encounter for antineoplastic radiation therapy: Secondary | ICD-10-CM | POA: Diagnosis not present

## 2022-09-29 LAB — RAD ONC ARIA SESSION SUMMARY
Course Elapsed Days: 21
Plan Fractions Treated to Date: 15
Plan Prescribed Dose Per Fraction: 2.5 Gy
Plan Total Fractions Prescribed: 28
Plan Total Prescribed Dose: 70 Gy
Reference Point Dosage Given to Date: 37.5 Gy
Reference Point Session Dosage Given: 2.5 Gy
Session Number: 15

## 2022-09-30 ENCOUNTER — Ambulatory Visit
Admission: RE | Admit: 2022-09-30 | Discharge: 2022-09-30 | Disposition: A | Discharge status: Home / Self Care | Payer: No Typology Code available for payment source | Source: Ambulatory Visit | Attending: Radiation Oncology | Admitting: Radiation Oncology

## 2022-09-30 ENCOUNTER — Other Ambulatory Visit: Payer: Self-pay

## 2022-09-30 DIAGNOSIS — Z510 Encounter for antineoplastic radiation therapy: Secondary | ICD-10-CM | POA: Diagnosis not present

## 2022-09-30 LAB — RAD ONC ARIA SESSION SUMMARY
Course Elapsed Days: 22
Plan Fractions Treated to Date: 16
Plan Prescribed Dose Per Fraction: 2.5 Gy
Plan Total Fractions Prescribed: 28
Plan Total Prescribed Dose: 70 Gy
Reference Point Dosage Given to Date: 40 Gy
Reference Point Session Dosage Given: 2.5 Gy
Session Number: 16

## 2022-10-01 ENCOUNTER — Other Ambulatory Visit: Payer: Self-pay

## 2022-10-01 ENCOUNTER — Ambulatory Visit
Admission: RE | Admit: 2022-10-01 | Discharge: 2022-10-01 | Disposition: A | Discharge status: Home / Self Care | Payer: No Typology Code available for payment source | Source: Ambulatory Visit | Attending: Radiation Oncology | Admitting: Radiation Oncology

## 2022-10-01 DIAGNOSIS — Z510 Encounter for antineoplastic radiation therapy: Secondary | ICD-10-CM | POA: Diagnosis not present

## 2022-10-01 LAB — RAD ONC ARIA SESSION SUMMARY
Course Elapsed Days: 23
Plan Fractions Treated to Date: 17
Plan Prescribed Dose Per Fraction: 2.5 Gy
Plan Total Fractions Prescribed: 28
Plan Total Prescribed Dose: 70 Gy
Reference Point Dosage Given to Date: 42.5 Gy
Reference Point Session Dosage Given: 2.5 Gy
Session Number: 17

## 2022-10-02 ENCOUNTER — Other Ambulatory Visit: Payer: Self-pay

## 2022-10-02 ENCOUNTER — Ambulatory Visit
Admission: RE | Admit: 2022-10-02 | Discharge: 2022-10-02 | Disposition: A | Discharge status: Home / Self Care | Payer: No Typology Code available for payment source | Source: Ambulatory Visit | Attending: Radiation Oncology | Admitting: Radiation Oncology

## 2022-10-02 DIAGNOSIS — Z510 Encounter for antineoplastic radiation therapy: Secondary | ICD-10-CM | POA: Diagnosis not present

## 2022-10-02 LAB — RAD ONC ARIA SESSION SUMMARY
Course Elapsed Days: 24
Plan Fractions Treated to Date: 18
Plan Prescribed Dose Per Fraction: 2.5 Gy
Plan Total Fractions Prescribed: 28
Plan Total Prescribed Dose: 70 Gy
Reference Point Dosage Given to Date: 45 Gy
Reference Point Session Dosage Given: 2.5 Gy
Session Number: 18

## 2022-10-03 ENCOUNTER — Ambulatory Visit
Admission: RE | Admit: 2022-10-03 | Discharge: 2022-10-03 | Disposition: A | Discharge status: Home / Self Care | Payer: No Typology Code available for payment source | Source: Ambulatory Visit | Attending: Radiation Oncology | Admitting: Radiation Oncology

## 2022-10-03 ENCOUNTER — Other Ambulatory Visit: Payer: Self-pay

## 2022-10-03 ENCOUNTER — Encounter: Payer: Self-pay | Admitting: Radiation Oncology

## 2022-10-03 DIAGNOSIS — Z510 Encounter for antineoplastic radiation therapy: Secondary | ICD-10-CM | POA: Diagnosis not present

## 2022-10-03 LAB — RAD ONC ARIA SESSION SUMMARY
Course Elapsed Days: 25
Plan Fractions Treated to Date: 19
Plan Prescribed Dose Per Fraction: 2.5 Gy
Plan Total Fractions Prescribed: 28
Plan Total Prescribed Dose: 70 Gy
Reference Point Dosage Given to Date: 47.5 Gy
Reference Point Session Dosage Given: 2.5 Gy
Session Number: 19

## 2022-10-06 ENCOUNTER — Other Ambulatory Visit: Payer: Self-pay

## 2022-10-06 ENCOUNTER — Ambulatory Visit
Admission: RE | Admit: 2022-10-06 | Discharge: 2022-10-06 | Disposition: A | Discharge status: Home / Self Care | Payer: No Typology Code available for payment source | Source: Ambulatory Visit | Attending: Radiation Oncology | Admitting: Radiation Oncology

## 2022-10-06 DIAGNOSIS — Z510 Encounter for antineoplastic radiation therapy: Secondary | ICD-10-CM | POA: Diagnosis not present

## 2022-10-06 LAB — RAD ONC ARIA SESSION SUMMARY
Course Elapsed Days: 28
Plan Fractions Treated to Date: 20
Plan Prescribed Dose Per Fraction: 2.5 Gy
Plan Total Fractions Prescribed: 28
Plan Total Prescribed Dose: 70 Gy
Reference Point Dosage Given to Date: 50 Gy
Reference Point Session Dosage Given: 2.5 Gy
Session Number: 20

## 2022-10-07 ENCOUNTER — Other Ambulatory Visit: Payer: Self-pay

## 2022-10-07 ENCOUNTER — Ambulatory Visit
Admission: RE | Admit: 2022-10-07 | Discharge: 2022-10-07 | Disposition: A | Discharge status: Home / Self Care | Payer: No Typology Code available for payment source | Source: Ambulatory Visit | Attending: Radiation Oncology | Admitting: Radiation Oncology

## 2022-10-07 DIAGNOSIS — Z510 Encounter for antineoplastic radiation therapy: Secondary | ICD-10-CM | POA: Diagnosis not present

## 2022-10-07 LAB — RAD ONC ARIA SESSION SUMMARY
Course Elapsed Days: 29
Plan Fractions Treated to Date: 21
Plan Prescribed Dose Per Fraction: 2.5 Gy
Plan Total Fractions Prescribed: 28
Plan Total Prescribed Dose: 70 Gy
Reference Point Dosage Given to Date: 52.5 Gy
Reference Point Session Dosage Given: 2.5 Gy
Session Number: 21

## 2022-10-08 ENCOUNTER — Other Ambulatory Visit: Payer: Self-pay

## 2022-10-08 ENCOUNTER — Ambulatory Visit
Admission: RE | Admit: 2022-10-08 | Discharge: 2022-10-08 | Disposition: A | Discharge status: Home / Self Care | Payer: No Typology Code available for payment source | Source: Ambulatory Visit | Attending: Radiation Oncology | Admitting: Radiation Oncology

## 2022-10-08 DIAGNOSIS — Z510 Encounter for antineoplastic radiation therapy: Secondary | ICD-10-CM | POA: Diagnosis not present

## 2022-10-08 LAB — RAD ONC ARIA SESSION SUMMARY
Course Elapsed Days: 30
Plan Fractions Treated to Date: 22
Plan Prescribed Dose Per Fraction: 2.5 Gy
Plan Total Fractions Prescribed: 28
Plan Total Prescribed Dose: 70 Gy
Reference Point Dosage Given to Date: 55 Gy
Reference Point Session Dosage Given: 2.5 Gy
Session Number: 22

## 2022-10-09 ENCOUNTER — Ambulatory Visit
Admission: RE | Admit: 2022-10-09 | Discharge: 2022-10-09 | Disposition: A | Discharge status: Home / Self Care | Payer: No Typology Code available for payment source | Source: Ambulatory Visit | Attending: Radiation Oncology | Admitting: Radiation Oncology

## 2022-10-09 ENCOUNTER — Emergency Department (HOSPITAL_BASED_OUTPATIENT_CLINIC_OR_DEPARTMENT_OTHER)
Admission: EM | Admit: 2022-10-09 | Discharge: 2022-10-10 | Disposition: A | Discharge status: Home / Self Care | Payer: No Typology Code available for payment source | Attending: Emergency Medicine | Admitting: Emergency Medicine

## 2022-10-09 ENCOUNTER — Encounter (HOSPITAL_BASED_OUTPATIENT_CLINIC_OR_DEPARTMENT_OTHER): Payer: Self-pay

## 2022-10-09 ENCOUNTER — Other Ambulatory Visit: Payer: Self-pay

## 2022-10-09 DIAGNOSIS — J449 Chronic obstructive pulmonary disease, unspecified: Secondary | ICD-10-CM | POA: Insufficient documentation

## 2022-10-09 DIAGNOSIS — N390 Urinary tract infection, site not specified: Secondary | ICD-10-CM | POA: Diagnosis not present

## 2022-10-09 DIAGNOSIS — R300 Dysuria: Secondary | ICD-10-CM | POA: Diagnosis present

## 2022-10-09 DIAGNOSIS — Z79899 Other long term (current) drug therapy: Secondary | ICD-10-CM | POA: Diagnosis not present

## 2022-10-09 DIAGNOSIS — I110 Hypertensive heart disease with heart failure: Secondary | ICD-10-CM | POA: Insufficient documentation

## 2022-10-09 DIAGNOSIS — R338 Other retention of urine: Secondary | ICD-10-CM

## 2022-10-09 DIAGNOSIS — I5022 Chronic systolic (congestive) heart failure: Secondary | ICD-10-CM | POA: Insufficient documentation

## 2022-10-09 DIAGNOSIS — I2510 Atherosclerotic heart disease of native coronary artery without angina pectoris: Secondary | ICD-10-CM | POA: Insufficient documentation

## 2022-10-09 DIAGNOSIS — Z8673 Personal history of transient ischemic attack (TIA), and cerebral infarction without residual deficits: Secondary | ICD-10-CM | POA: Insufficient documentation

## 2022-10-09 DIAGNOSIS — Z7951 Long term (current) use of inhaled steroids: Secondary | ICD-10-CM | POA: Diagnosis not present

## 2022-10-09 DIAGNOSIS — Z7982 Long term (current) use of aspirin: Secondary | ICD-10-CM | POA: Insufficient documentation

## 2022-10-09 DIAGNOSIS — J45909 Unspecified asthma, uncomplicated: Secondary | ICD-10-CM | POA: Insufficient documentation

## 2022-10-09 DIAGNOSIS — Z8546 Personal history of malignant neoplasm of prostate: Secondary | ICD-10-CM | POA: Insufficient documentation

## 2022-10-09 DIAGNOSIS — Z510 Encounter for antineoplastic radiation therapy: Secondary | ICD-10-CM | POA: Diagnosis not present

## 2022-10-09 LAB — CBC
HCT: 38.2 % — ABNORMAL LOW (ref 39.0–52.0)
Hemoglobin: 13.2 g/dL (ref 13.0–17.0)
MCH: 30.1 pg (ref 26.0–34.0)
MCHC: 34.6 g/dL (ref 30.0–36.0)
MCV: 87.2 fL (ref 80.0–100.0)
Platelets: 148 10*3/uL — ABNORMAL LOW (ref 150–400)
RBC: 4.38 MIL/uL (ref 4.22–5.81)
RDW: 13.3 % (ref 11.5–15.5)
WBC: 4.6 10*3/uL (ref 4.0–10.5)
nRBC: 0 % (ref 0.0–0.2)

## 2022-10-09 LAB — RAD ONC ARIA SESSION SUMMARY
Course Elapsed Days: 31
Plan Fractions Treated to Date: 23
Plan Prescribed Dose Per Fraction: 2.5 Gy
Plan Total Fractions Prescribed: 28
Plan Total Prescribed Dose: 70 Gy
Reference Point Dosage Given to Date: 57.5 Gy
Reference Point Session Dosage Given: 2.5 Gy
Session Number: 23

## 2022-10-09 LAB — URINALYSIS, ROUTINE W REFLEX MICROSCOPIC
Bilirubin Urine: NEGATIVE
Glucose, UA: NEGATIVE mg/dL
Ketones, ur: NEGATIVE mg/dL
Nitrite: NEGATIVE
Specific Gravity, Urine: 1.008 (ref 1.005–1.030)
pH: 5.5 (ref 5.0–8.0)

## 2022-10-09 LAB — BASIC METABOLIC PANEL
Anion gap: 13 (ref 5–15)
BUN: 14 mg/dL (ref 8–23)
CO2: 19 mmol/L — ABNORMAL LOW (ref 22–32)
Calcium: 9.5 mg/dL (ref 8.9–10.3)
Chloride: 103 mmol/L (ref 98–111)
Creatinine, Ser: 0.83 mg/dL (ref 0.61–1.24)
GFR, Estimated: >60 mL/min (ref >60)
Glucose, Bld: 135 mg/dL — ABNORMAL HIGH (ref 70–99)
Potassium: 3.6 mmol/L (ref 3.5–5.1)
Sodium: 135 mmol/L (ref 135–145)

## 2022-10-09 NOTE — ED Provider Notes (Incomplete)
[facility] Provider Note  CSN: [id number] Arrival date & time: 10/09/22 2223  Chief Complaint(s) Dysuria and Diarrhea  HPI Eric Stokes is a 77 y.o. male {Add pertinent medical, surgical, social history, OB history to HPI:1}    Dysuria Presenting symptoms: dysuria   Associated symptoms: diarrhea   Diarrhea   Past Medical History Past Medical History:  Diagnosis Date  . Aortic stenosis, mild    last echo in epic 05-27-2020  ef 40-45%,  mean grandiant 72mHg, valve area 2.46 cm^2 ,  mild regurg  . BPH with obstruction/lower urinary tract symptoms    urologist--- dr eJunious Silk . Carotid bruit present 06/15/2018   2015 Carotid Dopplers:  Essentially normal carotid arteries, with very slight hard plaque in the proximal ICA's and serpentine distal vessels. 1-39% bilateral ICA stenosis. Patent vertebral arteries with antegrade flow. Normal subclavian arteries, bilaterally.  . Chronic systolic CHF (congestive heart failure) ([place] 05/2020   followed by dr bAve Filter . COPD (chronic obstructive pulmonary disease) ([facility]   . Coronary artery disease 04/2020   cardiologist--- dr bHaroldine Laws   NSTEMI /  Afib w/ RVR/  Acute CHF/  cardiogenic shock 04-20-2020;  cath 05-17-2020 multivessel disease w/ critical LM;   05-18-2020 s/p CABG x4 and Maze procedure;  [facility], ef 54%  . Essential hypertension, benign 12/21/2008   Qualifier: Diagnosis of  By: HPercival Spanish MD, FFarrel Gordon   . Glaucoma, both eyes   . History of migraine   . History of non-ST elevation myocardial infarction (NSTEMI) 05/17/2020   cardiogenic shock,  AF w/ RVR, acute CHF,  ICM;   s/p CABG w/ MAZE  . History of squamous cell carcinoma of skin   . History of TIA (transient ischemic attack) 02/28/2019   questionable TIA  per neurology note dr sLeonie Man 04-12-2019, likely migraine  . Hx of colonic polyps 01/09/2020  . Hyperlipidemia   .  Ischemic cardiomyopathy 04/2020   per cath 09-03-20212  ef 25-30%;   last echo in epic 05-27-2020  ef 40-45%  . Malignant neoplasm prostate ([facility] 10/2019   urologist--- dr eJunious Silk  dx 03/ 2021  active survillance  . OA (osteoarthritis)   . OSA on CPAP    [facility]   . PAF (paroxysmal atrial fibrillation) ([place] 04/2020   followed by cardiology---  s/p MAZE procedure 05-18-2020, takes ASA  . PVC's (premature ventricular contractions)   . S/P CABG x 4 05/18/2020   LIMA to LAD,  RIMA to PDA, SVG to OM1 and OM2  . Seasonal allergic rhinitis   . Sinus bradycardia    event monitor 01-29-2017  mild bradycardia, no pauses, rare PVCs  . Vitamin B 12 deficiency   . Vitamin D deficiency 11/26/2010  . Wears hearing aid in both ears    Patient Active Problem List   Diagnosis Date Noted  . S/P CABG x 4 05/18/2020  . NSTEMI (non-ST elevated myocardial infarction) ([facility] 05/17/2020  . Atrial fibrillation with RVR ([facility] 05/16/2020  . Osteoarthritis of right knee 03/14/2020  . Colonic polyp 01/09/2020  . Malignant neoplasm of prostate ([facility] 11/30/2019  . Lymphadenopathy 11/30/2019  . Obesity (BMI 30.0-34.9) 04/08/2019  . TIA (transient ischemic attack) 02/28/2019  . At risk for bradycardia 06/15/2018  . B12 deficiency, with monthly injections 06/15/2018  . Primary osteoarthritis of right knee, followed by Orthopedics 04/12/2018  . Asthma, allergic, prn albuterol, triggered by perfumes  03/19/2018  . OSA on CPAP 09/25/2015  . Hyperplasia of prostate with lower urinary tract symptoms (LUTS) 09/03/2012  . Vitamin D deficiency 11/26/2010  . Allergic rhinitis, using Flonase 11/20/2010  . Hearing loss 11/20/2010  . Dyslipidemia, on Pravastatin 12/21/2008  . Benign essential hypertension 12/21/2008   Home Medication(s) Prior to Admission medications   Medication Sig Start Date End Date Taking? Authorizing Provider  albuterol (VENTOLIN HFA) 108 (90 Base) MCG/ACT  inhaler Inhale 1-2 puffs into the lungs 4 (four) times daily as needed. Rarely uses per pt on 08/22/22. 09/26/15   [provider]  alfuzosin (UROXATRAL) 10 MG 24 hr tablet Take 10 mg by mouth at bedtime.    [provider]  aspirin 81 MG EC tablet Take 81 mg by mouth daily. Swallow whole. Stopped on 08/20/21 for 08/26/21 surgery.    [provider]  carboxymethylcellulose (REFRESH PLUS) 0.5 % SOLN Place 1 drop into both eyes 2 (two) times daily as needed (dry eyes).    [provider]  cetirizine (ZYRTEC) 10 MG tablet Take 10 mg by mouth at bedtime. 01/16/13   [provider]  Cholecalciferol (VITAMIN D3) 2000 UNITS TABS Take 2,000 Int'l Units by mouth daily.     [provider]  dutasteride (AVODART) 0.5 MG capsule Take 0.5 mg by mouth daily.    [provider]  fluticasone (FLONASE) 50 MCG/ACT nasal spray USE TWO SPRAYS IN EACH NOSTRIL DAILY AS NEEDED. 05/02/20   Leamon Arnt, MD  ibuprofen (ADVIL) 400 MG tablet Take 1 tablet by mouth at bedtime. 09/27/21   [provider]  ketotifen (ZADITOR) 0.025 % ophthalmic solution Apply 1 drop to eye as needed. 12/12/20   [provider]  latanoprost (XALATAN) 0.005 % ophthalmic solution INSTILL 1 DROP INTO BOTH EYES AT BEDTIME 08/30/20   Leamon Arnt, MD  losartan (COZAAR) 25 MG tablet TAKE 0.5 TABLETS BY MOUTH AT BEDTIME. 06/06/22   [place], Maricela Bo, FNP  montelukast (SINGULAIR) 10 MG tablet TAKE 1 TABLET BY MOUTH EVERY DAY AT BEDTIME 05/27/20   Leamon Arnt, MD  NON FORMULARY Cpap at night    [provider]  rosuvastatin (CRESTOR) 20 MG tablet Take 1 tablet (20 mg total) by mouth daily. 07/31/20   Bensimhon, Shaune Pascal, MD  sildenafil (VIAGRA) 100 MG tablet Take 0.5 tablets (50 mg total) by mouth daily as needed for erectile dysfunction. 10/18/20   Bensimhon, Shaune Pascal, MD                                                                                                                                     Allergies Flomax [tamsulosin], Cipro [ciprofloxacin hcl], Lipitor [atorvastatin], Lisinopril, and Spironolactone  Review of Systems Review of Systems  Gastrointestinal:  Positive for diarrhea.  Genitourinary:  Positive for dysuria.   As noted in HPI  Physical Exam Vital Signs  I have reviewed the triage  vital signs BP 134/88   Pulse (!) 56   Temp 97.6 F (36.4 C) (Oral)   Resp 18   Ht 5' 9"$  (1.753 m)   Wt 99.8 kg   SpO2 99%   BMI 32.49 kg/m  *** Physical Exam  ED Results and Treatments Labs (all labs ordered are listed, but only abnormal results are displayed) Labs Reviewed  CBC - Abnormal; Notable for the following components:      Result Value   HCT 38.2 (*)    Platelets 148 (*)    All other components within normal limits  URINALYSIS, ROUTINE W REFLEX MICROSCOPIC  BASIC METABOLIC PANEL                                                                                                                         EKG  EKG Interpretation  Date/Time:    Ventricular Rate:    PR Interval:    QRS Duration:   QT Interval:    QTC Calculation:   R Axis:     Text Interpretation:         Radiology No results found.  Medications Ordered in ED Medications - No data to display                                                                                                                                   Procedures Procedures  (including critical care time)  Medical Decision Making / ED Course   Medical Decision Making Amount and/or Complexity of Data Reviewed Labs: ordered. Radiology: ordered.          Final Clinical Impression(s) / ED Diagnoses Final diagnoses:  None    {Document critical care time when appropriate:1}  {Document review of labs and clinical decision tools ie heart score, Chads2Vasc2 etc:1}  {Document your independent review of radiology images, and any outside records:1} {Document your discussion with  family members, caretakers, and with consultants:1} {Document social determinants of health affecting pt's care:1} {Document your decision making why or why not admission, treatments were needed:1} This chart was dictated using voice recognition software.  Despite best efforts to proofread,  errors can occur which can change the documentation meaning.

## 2022-10-09 NOTE — ED Provider Notes (Signed)
[facility] Provider Note  CSN: [id number] Arrival date & time: 10/09/22 2223  Chief Complaint(s) Dysuria and Diarrhea  HPI Eric Stokes is a 77 y.o. male with a past medical history listed below including prostate cancer currently undergoing radiation who presents to the emergency department with gradual onset of dysuria and difficulty voiding over the past week.  This evening, he has had increased pain and difficulty voiding prompting his visit to the ED.  He denies any fevers or chills.  No nausea or vomiting.  He is endorsing suprapubic discomfort and distention.  No other abdominal pain.  No other physical complaints.  The history is provided by the patient.    Past Medical History Past Medical History:  Diagnosis Date   Aortic stenosis, mild    last echo in epic 05-27-2020  ef 40-45%,  mean grandiant 62mHg, valve area 2.46 cm^2 ,  mild regurg   BPH with obstruction/lower urinary tract symptoms    urologist--- dr eskridge   Carotid bruit present 06/15/2018   2015 Carotid Dopplers:  Essentially normal carotid arteries, with very slight hard plaque in the proximal ICA's and serpentine distal vessels. 1-39% bilateral ICA stenosis. Patent vertebral arteries with antegrade flow. Normal subclavian arteries, bilaterally.   Chronic systolic CHF (congestive heart failure) ([facility] 05/2020   followed by dr bAve Filter  COPD (chronic obstructive pulmonary disease) ([facility]    Coronary artery disease 04/2020   cardiologist--- dr bHaroldine Laws   NSTEMI /  Afib w/ RVR/  Acute CHF/  cardiogenic shock 04-20-2020;  cath 05-17-2020 multivessel disease w/ critical LM;   05-18-2020 s/p CABG x4 and Maze procedure;  nuclear stress study done [facility]01-31-2023 no ischemia, ef 54%   Essential hypertension, benign 12/21/2008   Qualifier: Diagnosis of  By: HPercival Spanish MD, FFarrel Gordon    Glaucoma, both eyes    History of migraine    History of non-ST elevation myocardial  infarction (NSTEMI) 05/17/2020   cardiogenic shock,  AF w/ RVR, acute CHF,  ICM;   s/p CABG w/ MAZE   History of squamous cell carcinoma of skin    History of TIA (transient ischemic attack) 02/28/2019   questionable TIA  per neurology note dr sLeonie Man 04-12-2019, likely migraine   Hx of colonic polyps 01/09/2020   Hyperlipidemia    Ischemic cardiomyopathy 04/2020   per cath 09-03-20212  ef 25-30%;   last echo in epic 05-27-2020  ef 40-45%   Malignant neoplasm prostate ([facility] 10/2019   urologist--- dr eJunious Silk  dx 03/ 2021  active survillance   OA (osteoarthritis)    OSA on CPAP    [facility]    PAF (paroxysmal atrial fibrillation) ([facility] 04/2020   followed by cardiology---  s/p MAZE procedure 05-18-2020, takes ASA   PVC's (premature ventricular contractions)    S/P CABG x 4 05/18/2020   LIMA to LAD,  RIMA to PDA, SVG to OM1 and OM2   Seasonal allergic rhinitis    Sinus bradycardia    event monitor 01-29-2017  mild bradycardia, no pauses, rare PVCs   Vitamin B 12 deficiency    Vitamin D deficiency 11/26/2010   Wears hearing aid in both ears    Patient Active Problem List   Diagnosis Date Noted   S/P CABG x 4 05/18/2020   NSTEMI (non-ST elevated myocardial infarction) ([facility] 05/17/2020   Atrial fibrillation with RVR ([facility] 05/16/2020   Osteoarthritis of right knee 03/14/2020   Colonic  polyp 01/09/2020   Malignant neoplasm of prostate ([place]) 11/30/2019   Lymphadenopathy 11/30/2019   Obesity (BMI 30.0-34.9) 04/08/2019   TIA (transient ischemic attack) 02/28/2019   At risk for bradycardia 06/15/2018   B12 deficiency, with monthly injections 06/15/2018   Primary osteoarthritis of right knee, followed by Orthopedics 04/12/2018   Asthma, allergic, prn albuterol, triggered by perfumes 03/19/2018   OSA on CPAP 09/25/2015   Hyperplasia of prostate with lower urinary tract symptoms (LUTS) 09/03/2012   Vitamin D deficiency 11/26/2010   Allergic rhinitis, using  Flonase 11/20/2010   Hearing loss 11/20/2010   Dyslipidemia, on Pravastatin 12/21/2008   Benign essential hypertension 12/21/2008   Home Medication(s) Prior to Admission medications   Medication Sig Start Date End Date Taking? Authorizing Provider  cephALEXin (KEFLEX) 500 MG capsule Take 1 capsule (500 mg total) by mouth 3 (three) times daily for 14 days. 10/10/22 10/24/22 Yes Ayvion Kavanagh, Grayce Sessions, MD  albuterol (VENTOLIN HFA) 108 (90 Base) MCG/ACT inhaler Inhale 1-2 puffs into the lungs 4 (four) times daily as needed. Rarely uses per pt on 08/22/22. 09/26/15   [provider]  alfuzosin (UROXATRAL) 10 MG 24 hr tablet Take 10 mg by mouth at bedtime.    [provider]  aspirin 81 MG EC tablet Take 81 mg by mouth daily. Swallow whole. Stopped on 08/20/21 for 08/26/21 surgery.    [provider]  carboxymethylcellulose (REFRESH PLUS) 0.5 % SOLN Place 1 drop into both eyes 2 (two) times daily as needed (dry eyes).    [provider]  cetirizine (ZYRTEC) 10 MG tablet Take 10 mg by mouth at bedtime. 01/16/13   [provider]  Cholecalciferol (VITAMIN D3) 2000 UNITS TABS Take 2,000 Int'l Units by mouth daily.     [provider]  dutasteride (AVODART) 0.5 MG capsule Take 0.5 mg by mouth daily.    [provider]  fluticasone (FLONASE) 50 MCG/ACT nasal spray USE TWO SPRAYS IN EACH NOSTRIL DAILY AS NEEDED. 05/02/20   Leamon Arnt, MD  ibuprofen (ADVIL) 400 MG tablet Take 1 tablet by mouth at bedtime. 09/27/21   [provider]  ketotifen (ZADITOR) 0.025 % ophthalmic solution Apply 1 drop to eye as needed. 12/12/20   [provider]  latanoprost (XALATAN) 0.005 % ophthalmic solution INSTILL 1 DROP INTO BOTH EYES AT BEDTIME 08/30/20   Leamon Arnt, MD  losartan (COZAAR) 25 MG tablet TAKE 0.5 TABLETS BY MOUTH AT BEDTIME. 06/06/22   [place], Maricela Bo, FNP  montelukast (SINGULAIR) 10 MG tablet TAKE 1 TABLET BY MOUTH EVERY DAY AT  BEDTIME 05/27/20   Leamon Arnt, MD  NON FORMULARY Cpap at night    [provider]  rosuvastatin (CRESTOR) 20 MG tablet Take 1 tablet (20 mg total) by mouth daily. 07/31/20   Bensimhon, Shaune Pascal, MD  sildenafil (VIAGRA) 100 MG tablet Take 0.5 tablets (50 mg total) by mouth daily as needed for erectile dysfunction. 10/18/20   Bensimhon, Shaune Pascal, MD  Allergies Flomax [tamsulosin], Cipro [ciprofloxacin hcl], Lipitor [atorvastatin], Lisinopril, and Spironolactone  Review of Systems Review of Systems As noted in HPI  Physical Exam Vital Signs  I have reviewed the triage vital signs BP 110/64   Pulse 65   Temp 97.6 F (36.4 C) (Oral)   Resp 16   Ht '5\' 9"'$  (1.753 m)   Wt 99.8 kg   SpO2 100%   BMI 32.49 kg/m   Physical Exam Vitals reviewed.  Constitutional:      General: He is not in acute distress.    Appearance: He is well-developed. He is not diaphoretic.  HENT:     Head: Normocephalic and atraumatic.     Right Ear: External ear normal.     Left Ear: External ear normal.     Nose: Nose normal.     Mouth/Throat:     Mouth: Mucous membranes are moist.  Eyes:     General: No scleral icterus.    Conjunctiva/sclera: Conjunctivae normal.  Neck:     Trachea: Phonation normal.  Cardiovascular:     Rate and Rhythm: Normal rate and regular rhythm.  Pulmonary:     Effort: Pulmonary effort is normal. No respiratory distress.     Breath sounds: No stridor.  Abdominal:     General: There is no distension.     Tenderness: There is abdominal tenderness in the suprapubic area.     Hernia: No hernia is present.  Genitourinary:    Penis: Normal. No erythema, tenderness, discharge, swelling or lesions.      Testes: Normal.  Musculoskeletal:        General: Normal range of motion.     Cervical back: Normal range of motion.  Neurological:      Mental Status: He is alert and oriented to person, place, and time.  Psychiatric:        Behavior: Behavior normal.     ED Results and Treatments Labs (all labs ordered are listed, but only abnormal results are displayed) Labs Reviewed  URINALYSIS, ROUTINE W REFLEX MICROSCOPIC - Abnormal; Notable for the following components:      Result Value   Hgb urine dipstick TRACE (*)    Protein, ur TRACE (*)    Leukocytes,Ua SMALL (*)    Bacteria, UA RARE (*)    All other components within normal limits  BASIC METABOLIC PANEL - Abnormal; Notable for the following components:   CO2 19 (*)    Glucose, Bld 135 (*)    All other components within normal limits  CBC - Abnormal; Notable for the following components:   HCT 38.2 (*)    Platelets 148 (*)    All other components within normal limits  URINE CULTURE                                                                                                                         EKG  EKG Interpretation  Date/Time:    Ventricular Rate:  PR Interval:    QRS Duration:   QT Interval:    QTC Calculation:   R Axis:     Text Interpretation:         Radiology CT ABDOMEN PELVIS W CONTRAST  Result Date: 10/10/2022 CLINICAL DATA:  Left lower quadrant abdominal pain. EXAM: CT ABDOMEN AND PELVIS WITH CONTRAST TECHNIQUE: Multidetector CT imaging of the abdomen and pelvis was performed using the standard protocol following bolus administration of intravenous contrast. RADIATION DOSE REDUCTION: This exam was performed according to the departmental dose-optimization program which includes automated exposure control, adjustment of the mA and/or kV according to patient size and/or use of iterative reconstruction technique. CONTRAST:  179m OMNIPAQUE IOHEXOL 300 MG/ML  SOLN COMPARISON:  CT abdomen pelvis dated 01/17/2021. FINDINGS: Lower chest: The visualized lung bases are clear. There is coronary vascular calcification. No intra-abdominal free air or free  fluid. Hepatobiliary: Several small liver cyst and additional subcentimeter hypodense lesions which are too small to characterize. No biliary dilatation. Cholecystectomy. No retained calcified stone noted in the central CBD. Pancreas: Unremarkable. No pancreatic ductal dilatation or surrounding inflammatory changes. Spleen: Normal in size without focal abnormality. Adrenals/Urinary Tract: The adrenal glands unremarkable. There is no hydronephrosis on either side. There is symmetric enhancement and excretion of contrast by both kidneys. Bilateral renal cysts measure up to 4 cm in the inferior pole of the left kidney. Several additional subcentimeter hypodense lesions are too small to characterize. No imaging follow-up. The visualized ureters appear unremarkable. There is mild trabeculated appearance of the bladder wall, likely related to chronic bladder outlet obstruction. There is a 4.5 cm bladder dome diverticula. There is mild perivesical haziness. Correlation with urinalysis recommended to evaluate for cystitis. Stomach/Bowel: Small duodenal diverticulum without active inflammatory changes. Mild sigmoid diverticulosis without active inflammatory changes. There is no bowel obstruction or active inflammation. The appendix is normal. Vascular/Lymphatic: Moderate aortoiliac atherosclerotic disease. The IVC is unremarkable. No portal venous gas. There is no adenopathy. Reproductive: Mildly enlarged prostate gland measuring 5 cm in transverse axial diameter. The seminal vesicles are symmetric. Several biopsy markers noted. There is hazy appearance of the prostate gland which may be related to cystitis or prostatitis. Other: None Musculoskeletal: Degenerative changes of the spine. No acute osseous pathology. IMPRESSION: 1. Perivesical and prostatic haziness may represent cystitis or prostatitis. Clinical correlation is recommended. 2. Mild sigmoid diverticulosis. No bowel obstruction. Normal appendix. 3.  Aortic  Atherosclerosis (ICD10-I70.0). Electronically Signed   By: AAnner CreteM.D.   On: 10/10/2022 00:30    Medications Ordered in ED Medications  iohexol (OMNIPAQUE) 300 MG/ML solution 100 mL (100 mLs Intravenous Contrast Given 10/10/22 0008)  cefTRIAXone (ROCEPHIN) 1 g in sodium chloride 0.9 % 100 mL IVPB (0 g Intravenous Stopped 10/10/22 0238)  ketorolac (TORADOL) 15 MG/ML injection 15 mg (15 mg Intravenous Given 10/10/22 0104)  lidocaine (XYLOCAINE) 2 % jelly 1 Application (1 Application Urethral Given 10/10/22 0152)  fentaNYL (SUBLIMAZE) injection 25 mcg (25 mcg Intravenous Given 10/10/22 0149)  Procedures Ultrasound ED Renal  Date/Time: 10/10/2022 2:51 AM  Performed by: Fatima Blank, MD Authorized by: Fatima Blank, MD   Procedure details:    Indications: urinary retention     Technique:  BladderImages: archived Bladder findings:    Bladder:  Visualized   Free pelvic fluid: not identified     Volume:  335cc BLADDER CATHETERIZATION  Date/Time: 10/10/2022 2:52 AM  Performed by: Fatima Blank, MD Authorized by: Fatima Blank, MD   Consent:    Consent obtained:  Verbal   Consent given by:  Patient   Risks discussed:  False passage, infection, urethral injury, pain and incomplete procedure   Alternatives discussed:  No treatment Universal protocol:    Imaging studies available: yes     Patient identity confirmed:  Verbally with patient Pre-procedure details:    Procedure purpose:  Therapeutic   Preparation: Patient was prepped and draped in usual sterile fashion   Anesthesia:    Anesthesia method:  Topical application   Topical anesthetic:  Lidocaine gel Procedure details:    Provider performed due to:  Complicated insertion and nurse unable to complete   Catheter insertion:  Temporary indwelling   Catheter  type:  Coude   Catheter size:  18 Fr   Number of attempts:  2 Post-procedure details:    Procedure completion:  Procedure terminated electively by provider Comments:     Foley was hubbed and unable to pass    (including critical care time)  Medical Decision Making / ED Course   Medical Decision Making Amount and/or Complexity of Data Reviewed External Data Reviewed: notes.    Details: Rad Onc and Urology notes mentioned in HPI Labs: ordered. Decision-making details documented in ED Course. Radiology: ordered and independent interpretation performed. Decision-making details documented in ED Course.  Risk Prescription drug management.    This patient presents to the ED for concern of urinary retention and dysuria, this involves an extensive number of treatment options, and is a complaint that carries with it a high risk of complications and morbidity. The differential diagnosis includes but not limited to obstruction from UTI, post radiation inflammation/stricture.  Bladder scan with >300cc Voided approx 75cc afterward Cbc w/o leukocytosis BMP without significant electrolyte derangement or renal sufficiency. UA not convincing for urinary tract infection but similar to prior UA were patient grew out Klebsiella.  Will send for culture.  Klebsiella in September was sensitive to cephalosporins.  Also on review of records, patient had Serratia grow out in the urine a couple years ago that was also sensitive to cephalosporins. Patient given a dose of IV Rocephin in the emergency department.  Also given Toradol for pain.  CT was obtained to assess any complications from radiation treatment.  Prostate and bladder inflammation noted likely related to urinary tract infection patient.  No abscess.   Consulted urology and spoke with Dr. Garen Lah (resident) who felt that a foley would be ok to place for his retention since he already had his beads inserted.  Multiple attempts by nursing to place  Foley were unsuccessful.  I personally attempted using 71 French coud and was unable to either.  Consulted urology again to discuss sending patient to [facility] so they can attempt to place a Foley using the scope.  Transferred to [facility] was arranged.  Just prior to patient leaving, he voided twice resulting in approximately 400 cc of urine.  He reported significant relief.  He would like to defer transfer to [facility]  for Foley placement and follow-up with urology in clinic if needed.  I feel that this is reasonable.  On-call urologist was updated.      Final Clinical Impression(s) / ED Diagnoses Final diagnoses:  Acute urinary retention  Acute UTI   The patient appears reasonably screened and/or stabilized for discharge and I doubt any other medical condition or other [facility] requiring further screening, evaluation, or treatment in the ED at this time. I have discussed the findings, Dx and Tx plan with the patient/family who expressed understanding and agree(s) with the plan. Discharge instructions discussed at length. The patient/family was given strict return precautions who verbalized understanding of the instructions. No further questions at time of discharge.  Disposition: Discharge  Condition: Good  ED Discharge Orders          Ordered    cephALEXin (KEFLEX) 500 MG capsule  3 times daily        10/10/22 0335             Follow Up: Festus Aloe, MD [place] [address] [phone]  Call  to schedule an appointment for close follow up  Tyler Pita, MD [place] [address] [phone]  Call  as scheduled            This chart was dictated using voice recognition software.  Despite best efforts to proofread,  errors can occur which can change the documentation meaning.    Fatima Blank, MD 10/10/22 [phone]

## 2022-10-09 NOTE — ED Triage Notes (Addendum)
Reports dysuria and difficulty voiding x1 week. Also reports diarrhea with onset Tuesday. Getting radiation, last tx was this AM for prostate CA. Last void around 1600.

## 2022-10-09 NOTE — ED Notes (Signed)
Bladder scan shows ~342m.

## 2022-10-10 ENCOUNTER — Encounter (HOSPITAL_COMMUNITY)
Admission: AD | Disposition: A | Discharge status: Home / Self Care | Payer: Self-pay | Source: Ambulatory Visit | Attending: Urology

## 2022-10-10 ENCOUNTER — Ambulatory Visit: Payer: No Typology Code available for payment source

## 2022-10-10 ENCOUNTER — Ambulatory Visit (HOSPITAL_COMMUNITY): Payer: No Typology Code available for payment source | Admitting: Anesthesiology

## 2022-10-10 ENCOUNTER — Ambulatory Visit (HOSPITAL_BASED_OUTPATIENT_CLINIC_OR_DEPARTMENT_OTHER): Payer: No Typology Code available for payment source | Admitting: Anesthesiology

## 2022-10-10 ENCOUNTER — Ambulatory Visit (HOSPITAL_COMMUNITY): Payer: No Typology Code available for payment source

## 2022-10-10 ENCOUNTER — Other Ambulatory Visit (HOSPITAL_BASED_OUTPATIENT_CLINIC_OR_DEPARTMENT_OTHER): Payer: Self-pay

## 2022-10-10 ENCOUNTER — Other Ambulatory Visit: Payer: Self-pay

## 2022-10-10 ENCOUNTER — Emergency Department (HOSPITAL_BASED_OUTPATIENT_CLINIC_OR_DEPARTMENT_OTHER): Payer: No Typology Code available for payment source

## 2022-10-10 ENCOUNTER — Ambulatory Visit (HOSPITAL_COMMUNITY)
Admission: AD | Admit: 2022-10-10 | Discharge: 2022-10-10 | Disposition: A | Discharge status: Home / Self Care | Payer: No Typology Code available for payment source | Source: Ambulatory Visit | Attending: Urology | Admitting: Urology

## 2022-10-10 ENCOUNTER — Other Ambulatory Visit: Payer: Self-pay | Admitting: Urology

## 2022-10-10 ENCOUNTER — Encounter (HOSPITAL_COMMUNITY): Payer: Self-pay | Admitting: Urology

## 2022-10-10 DIAGNOSIS — I509 Heart failure, unspecified: Secondary | ICD-10-CM

## 2022-10-10 DIAGNOSIS — Z87891 Personal history of nicotine dependence: Secondary | ICD-10-CM | POA: Insufficient documentation

## 2022-10-10 DIAGNOSIS — N35919 Unspecified urethral stricture, male, unspecified site: Secondary | ICD-10-CM

## 2022-10-10 DIAGNOSIS — I110 Hypertensive heart disease with heart failure: Secondary | ICD-10-CM | POA: Diagnosis not present

## 2022-10-10 DIAGNOSIS — I2510 Atherosclerotic heart disease of native coronary artery without angina pectoris: Secondary | ICD-10-CM | POA: Diagnosis not present

## 2022-10-10 DIAGNOSIS — Z951 Presence of aortocoronary bypass graft: Secondary | ICD-10-CM

## 2022-10-10 DIAGNOSIS — J449 Chronic obstructive pulmonary disease, unspecified: Secondary | ICD-10-CM | POA: Insufficient documentation

## 2022-10-10 DIAGNOSIS — K5730 Diverticulosis of large intestine without perforation or abscess without bleeding: Secondary | ICD-10-CM | POA: Diagnosis not present

## 2022-10-10 DIAGNOSIS — C61 Malignant neoplasm of prostate: Secondary | ICD-10-CM | POA: Insufficient documentation

## 2022-10-10 DIAGNOSIS — I252 Old myocardial infarction: Secondary | ICD-10-CM | POA: Insufficient documentation

## 2022-10-10 DIAGNOSIS — G4733 Obstructive sleep apnea (adult) (pediatric): Secondary | ICD-10-CM | POA: Insufficient documentation

## 2022-10-10 DIAGNOSIS — R338 Other retention of urine: Secondary | ICD-10-CM | POA: Insufficient documentation

## 2022-10-10 DIAGNOSIS — I480 Paroxysmal atrial fibrillation: Secondary | ICD-10-CM | POA: Diagnosis not present

## 2022-10-10 DIAGNOSIS — I5022 Chronic systolic (congestive) heart failure: Secondary | ICD-10-CM | POA: Insufficient documentation

## 2022-10-10 DIAGNOSIS — M1990 Unspecified osteoarthritis, unspecified site: Secondary | ICD-10-CM | POA: Diagnosis not present

## 2022-10-10 DIAGNOSIS — I350 Nonrheumatic aortic (valve) stenosis: Secondary | ICD-10-CM | POA: Diagnosis not present

## 2022-10-10 DIAGNOSIS — Z9889 Other specified postprocedural states: Secondary | ICD-10-CM | POA: Diagnosis not present

## 2022-10-10 DIAGNOSIS — Z8673 Personal history of transient ischemic attack (TIA), and cerebral infarction without residual deficits: Secondary | ICD-10-CM | POA: Insufficient documentation

## 2022-10-10 HISTORY — PX: INSERTION OF SUPRAPUBIC CATHETER: SHX5870

## 2022-10-10 SURGERY — INSERTION, SUPRAPUBIC CATHETER
Anesthesia: General

## 2022-10-10 MED ORDER — CEPHALEXIN 500 MG PO CAPS
500.0000 mg | ORAL_CAPSULE | Freq: Three times a day (TID) | ORAL | 0 refills | Status: AC
  Filled 2022-10-10 (×2): qty 42, 14d supply, fill #0

## 2022-10-10 MED ORDER — ONDANSETRON HCL 4 MG/2ML IJ SOLN
INTRAMUSCULAR | Status: AC
  Filled 2022-10-10: qty 2

## 2022-10-10 MED ORDER — MORPHINE SULFATE (PF) 2 MG/ML IV SOLN: 2.0000 mg | Freq: Once | INTRAVENOUS | Status: DC

## 2022-10-10 MED ORDER — IOHEXOL 300 MG/ML  SOLN
100.0000 mL | Freq: Once | INTRAMUSCULAR | Status: AC | PRN
  Administered 2022-10-10: 100 mL via INTRAVENOUS

## 2022-10-10 MED ORDER — ROCURONIUM BROMIDE 10 MG/ML (PF) SYRINGE
PREFILLED_SYRINGE | INTRAVENOUS | Status: DC | PRN
  Administered 2022-10-10: 50 mg via INTRAVENOUS

## 2022-10-10 MED ORDER — DEXAMETHASONE SODIUM PHOSPHATE 10 MG/ML IJ SOLN
INTRAMUSCULAR | Status: DC | PRN
  Administered 2022-10-10: 10 mg via INTRAVENOUS

## 2022-10-10 MED ORDER — PHENYLEPHRINE 80 MCG/ML (10ML) SYRINGE FOR IV PUSH (FOR BLOOD PRESSURE SUPPORT)
PREFILLED_SYRINGE | INTRAVENOUS | Status: DC | PRN
  Administered 2022-10-10: 160 ug via INTRAVENOUS
  Administered 2022-10-10: 80 ug via INTRAVENOUS
  Administered 2022-10-10: 160 ug via INTRAVENOUS

## 2022-10-10 MED ORDER — CEPHALEXIN 500 MG PO CAPS
500.0000 mg | ORAL_CAPSULE | Freq: Two times a day (BID) | ORAL | 0 refills | Status: DC
  Filled 2022-10-10: qty 14, 7d supply, fill #0

## 2022-10-10 MED ORDER — KETOROLAC TROMETHAMINE 15 MG/ML IJ SOLN
15.0000 mg | Freq: Once | INTRAMUSCULAR | Status: AC
  Administered 2022-10-10: 15 mg via INTRAVENOUS
  Filled 2022-10-10: qty 1

## 2022-10-10 MED ORDER — OXYCODONE HCL 5 MG PO TABS
5.0000 mg | ORAL_TABLET | Freq: Four times a day (QID) | ORAL | 0 refills | Status: DC | PRN
  Filled 2022-10-10: qty 12, 3d supply, fill #0

## 2022-10-10 MED ORDER — FENTANYL CITRATE PF 50 MCG/ML IJ SOSY
PREFILLED_SYRINGE | INTRAMUSCULAR | Status: AC
  Filled 2022-10-10: qty 1

## 2022-10-10 MED ORDER — OXYCODONE HCL 5 MG PO TABS
5.0000 mg | ORAL_TABLET | Freq: Once | ORAL | Status: AC
  Administered 2022-10-10: 5 mg via ORAL

## 2022-10-10 MED ORDER — FENTANYL CITRATE PF 50 MCG/ML IJ SOSY
50.0000 ug | PREFILLED_SYRINGE | Freq: Once | INTRAMUSCULAR | Status: AC
  Administered 2022-10-10: 50 ug via INTRAVENOUS

## 2022-10-10 MED ORDER — 0.9 % SODIUM CHLORIDE (POUR BTL) OPTIME
TOPICAL | Status: DC | PRN
  Administered 2022-10-10: 1000 mL

## 2022-10-10 MED ORDER — CEFAZOLIN SODIUM-DEXTROSE 2-4 GM/100ML-% IV SOLN
INTRAVENOUS | Status: AC
  Filled 2022-10-10: qty 100

## 2022-10-10 MED ORDER — SODIUM CHLORIDE 0.9 % IV SOLN
1.0000 g | Freq: Once | INTRAVENOUS | Status: AC
  Administered 2022-10-10: 1 g via INTRAVENOUS
  Filled 2022-10-10: qty 10

## 2022-10-10 MED ORDER — PROPOFOL 10 MG/ML IV BOLUS
INTRAVENOUS | Status: DC | PRN
  Administered 2022-10-10: 150 mg via INTRAVENOUS

## 2022-10-10 MED ORDER — PHENYLEPHRINE 80 MCG/ML (10ML) SYRINGE FOR IV PUSH (FOR BLOOD PRESSURE SUPPORT)
PREFILLED_SYRINGE | INTRAVENOUS | Status: AC
  Filled 2022-10-10: qty 40

## 2022-10-10 MED ORDER — LACTATED RINGERS IV SOLN: INTRAVENOUS | Status: DC

## 2022-10-10 MED ORDER — CHLORHEXIDINE GLUCONATE 0.12 % MT SOLN
15.0000 mL | Freq: Once | OROMUCOSAL | Status: AC
  Administered 2022-10-10: 15 mL via OROMUCOSAL

## 2022-10-10 MED ORDER — CEFAZOLIN SODIUM-DEXTROSE 2-4 GM/100ML-% IV SOLN
2.0000 g | INTRAVENOUS | Status: AC
  Administered 2022-10-10: 2 g via INTRAVENOUS

## 2022-10-10 MED ORDER — CELECOXIB 200 MG PO CAPS
200.0000 mg | ORAL_CAPSULE | Freq: Two times a day (BID) | ORAL | 1 refills | Status: AC | PRN
  Filled 2022-10-10: qty 20, 10d supply, fill #0

## 2022-10-10 MED ORDER — DEXAMETHASONE SODIUM PHOSPHATE 10 MG/ML IJ SOLN
INTRAMUSCULAR | Status: AC
  Filled 2022-10-10: qty 1

## 2022-10-10 MED ORDER — FENTANYL CITRATE (PF) 100 MCG/2ML IJ SOLN
INTRAMUSCULAR | Status: DC | PRN
  Administered 2022-10-10: 50 ug via INTRAVENOUS

## 2022-10-10 MED ORDER — FENTANYL CITRATE (PF) 100 MCG/2ML IJ SOLN
INTRAMUSCULAR | Status: AC
  Filled 2022-10-10: qty 2

## 2022-10-10 MED ORDER — SUGAMMADEX SODIUM 200 MG/2ML IV SOLN
INTRAVENOUS | Status: DC | PRN
  Administered 2022-10-10: 200 mg via INTRAVENOUS

## 2022-10-10 MED ORDER — FENTANYL CITRATE PF 50 MCG/ML IJ SOSY
25.0000 ug | PREFILLED_SYRINGE | INTRAMUSCULAR | Status: DC | PRN
  Administered 2022-10-10: 25 ug via INTRAVENOUS

## 2022-10-10 MED ORDER — PROPOFOL 10 MG/ML IV BOLUS
INTRAVENOUS | Status: AC
  Filled 2022-10-10: qty 20

## 2022-10-10 MED ORDER — SODIUM CHLORIDE 0.9 % IR SOLN
Status: DC | PRN
  Administered 2022-10-10: 3000 mL

## 2022-10-10 MED ORDER — ONDANSETRON HCL 4 MG/2ML IJ SOLN
INTRAMUSCULAR | Status: DC | PRN
  Administered 2022-10-10: 4 mg via INTRAVENOUS

## 2022-10-10 MED ORDER — LIDOCAINE HCL URETHRAL/MUCOSAL 2 % EX GEL
1.0000 | Freq: Once | CUTANEOUS | Status: AC
  Administered 2022-10-10: 1 via URETHRAL
  Filled 2022-10-10: qty 11

## 2022-10-10 MED ORDER — IOHEXOL 300 MG/ML  SOLN
INTRAMUSCULAR | Status: DC | PRN
  Administered 2022-10-10: 10 mL

## 2022-10-10 MED ORDER — OXYCODONE HCL 5 MG PO TABS
ORAL_TABLET | ORAL | Status: AC
  Filled 2022-10-10: qty 1

## 2022-10-10 MED ORDER — LIDOCAINE 2% (20 MG/ML) 5 ML SYRINGE
INTRAMUSCULAR | Status: DC | PRN
  Administered 2022-10-10: 60 mg via INTRAVENOUS

## 2022-10-10 MED ORDER — FENTANYL CITRATE PF 50 MCG/ML IJ SOSY
25.0000 ug | PREFILLED_SYRINGE | Freq: Once | INTRAMUSCULAR | Status: AC
  Administered 2022-10-10: 25 ug via INTRAVENOUS
  Filled 2022-10-10: qty 1

## 2022-10-10 SURGICAL SUPPLY — 35 items
BAG COUNTER SPONGE SURGICOUNT (BAG) IMPLANT
BAG DRN RND TRDRP ANRFLXCHMBR (UROLOGICAL SUPPLIES) ×1
BAG SPNG CNTER NS LX DISP (BAG)
BAG URINE DRAIN 2000ML AR STRL (UROLOGICAL SUPPLIES) ×2 IMPLANT
BAG URINE LEG 500ML (DRAIN) ×1 IMPLANT
BLADE SURG 15 STRL LF DISP TIS (BLADE) ×1 IMPLANT
BLADE SURG 15 STRL SS (BLADE) ×1
CATH BONANNO SUPRAPUBIC 14G (CATHETERS) IMPLANT
CATH FOLEY 2WAY SLVR  5CC 16FR (CATHETERS) ×1
CATH FOLEY 2WAY SLVR 5CC 16FR (CATHETERS) ×2 IMPLANT
CATH FOLEY INTRO SUPRA 16F (CATHETERS) ×1 IMPLANT
COUNTER NEEDLE 1200 MAGNETIC (NEEDLE) ×1 IMPLANT
COVER SURGICAL LIGHT HANDLE (MISCELLANEOUS) ×1 IMPLANT
ELECT REM PT RETURN 15FT ADLT (MISCELLANEOUS) ×2 IMPLANT
GAUZE 4X4 16PLY ~~LOC~~+RFID DBL (SPONGE) ×1 IMPLANT
GLOVE SURG LX STRL 7.5 STRW (GLOVE) ×2 IMPLANT
GOWN STRL REUS W/ TWL LRG LVL3 (GOWN DISPOSABLE) ×2 IMPLANT
GOWN STRL REUS W/ TWL XL LVL3 (GOWN DISPOSABLE) ×1 IMPLANT
GOWN STRL REUS W/TWL LRG LVL3 (GOWN DISPOSABLE) ×1
GOWN STRL REUS W/TWL XL LVL3 (GOWN DISPOSABLE) ×1
KIT TURNOVER KIT A (KITS) IMPLANT
MANIFOLD NEPTUNE II (INSTRUMENTS) ×2 IMPLANT
NDL HYPO 22X1.5 SAFETY MO (MISCELLANEOUS) IMPLANT
NEEDLE HYPO 22X1.5 SAFETY MO (MISCELLANEOUS) IMPLANT
NEEDLE SAFETY HYPO 22GAX1.5 (MISCELLANEOUS)
NEEDLE SPNL 22GX7 SPINOC (NEEDLE) ×1 IMPLANT
PACK CYSTO (CUSTOM PROCEDURE TRAY) ×1 IMPLANT
PLUG CATH AND CAP STER (CATHETERS) IMPLANT
SPONGE DRAIN TRACH 4X4 STRL 2S (GAUZE/BANDAGES/DRESSINGS) ×1 IMPLANT
SUT SILK 2 0 SH (SUTURE) ×1 IMPLANT
SYR TOOMEY IRRIG 70ML (MISCELLANEOUS)
SYRINGE TOOMEY IRRIG 70ML (MISCELLANEOUS) IMPLANT
TOWEL OR 17X26 10 PK STRL BLUE (TOWEL DISPOSABLE) ×1 IMPLANT
WATER STERILE IRR 1000ML POUR (IV SOLUTION) ×2 IMPLANT
WATER STERILE IRR 3000ML UROMA (IV SOLUTION) ×2 IMPLANT

## 2022-10-10 NOTE — ED Notes (Signed)
Wife leaving. Daughter at bedside. Dr Leonette Monarch at bedside to go over [place] with daughter. Patient denies needs at this time.

## 2022-10-10 NOTE — Transfer of Care (Signed)
Immediate Anesthesia Transfer of Care Note  Patient: Eric Stokes  Procedure(s) Performed: INSERTION OF SUPRAPUBIC CATHETER cystoscopy urethral dilation  Patient Location: PACU  Anesthesia Type:General  Level of Consciousness: awake, alert , and oriented  Airway & Oxygen Therapy: Patient Spontanous Breathing and Patient connected to face mask oxygen  Post-op Assessment: Report given to RN and Post -op Vital signs reviewed and stable  Post vital signs: Reviewed and stable  Last Vitals:  Vitals Value Taken Time  BP 129/79 10/10/22 1912  Temp    Pulse 78 10/10/22 1914  Resp 13 10/10/22 1914  SpO2 100 % 10/10/22 1914  Vitals shown include unvalidated device data.  Last Pain:  Vitals:   10/10/22 1651  TempSrc: Oral  PainSc: 2       Patients Stated Pain Goal: 0 ([id number])  Complications: No notable events documented.

## 2022-10-10 NOTE — Anesthesia Preprocedure Evaluation (Addendum)
Anesthesia Evaluation  Patient identified by MRN, date of birth, ID band Patient awake    Reviewed: Allergy & Precautions, NPO status , Patient's Chart, lab work & pertinent test results  Airway Mallampati: I  TM Distance: >3 FB Neck ROM: Full    Dental no notable dental hx. (+) Teeth Intact, Dental Advisory Given   Pulmonary asthma , sleep apnea and Continuous Positive Airway Pressure Ventilation , COPD,  COPD inhaler, former smoker   Pulmonary exam normal breath sounds clear to auscultation       Cardiovascular hypertension, + CAD, + Past MI, + CABG and +CHF  Normal cardiovascular exam+ dysrhythmias Atrial Fibrillation  Rhythm:Regular Rate:Normal  TTE 2021 1. Septal hypokinesis and dyskinesis. Basal to mid inferior hypokinesis..  Left ventricular ejection fraction, by estimation, is 40 to 45%. The left  ventricle has mildly decreased function. The left ventricle demonstrates  regional wall motion  abnormalities (see scoring diagram/findings for description). There is  moderate concentric left ventricular hypertrophy. Left ventricular  diastolic parameters are consistent with Grade II diastolic dysfunction  (pseudonormalization).   2. Trivial mitral valve regurgitation.   3. The aortic valve is calcified. There is moderate calcification of the  aortic valve. There is moderate thickening of the aortic valve. Aortic  valve regurgitation is mild. Mild aortic valve stenosis.   4. Aortic dilatation noted. There is mild dilatation of the ascending  aorta, measuring 40 mm.   5. There is normal pulmonary artery systolic pressure.     Neuro/Psych TIA negative psych ROS   GI/Hepatic negative GI ROS, Neg liver ROS,,,  Endo/Other  negative endocrine ROS    Renal/GU negative Renal ROS  negative genitourinary   Musculoskeletal  (+) Arthritis ,    Abdominal   Peds  Hematology negative hematology ROS (+)   Anesthesia Other  Findings Prostate CA  Reproductive/Obstetrics                             Anesthesia Physical Anesthesia Plan  ASA: 3 and emergent  Anesthesia Plan: General   Post-op Pain Management:    Induction: Intravenous  PONV Risk Score and Plan: 2 and Dexamethasone, Ondansetron and Treatment may vary due to age or medical condition  Airway Management Planned: Oral ETT  Additional Equipment:   Intra-op Plan:   Post-operative Plan: Extubation in OR  Informed Consent: I have reviewed the patients History and Physical, chart, labs and discussed the procedure including the risks, benefits and alternatives for the proposed anesthesia with the patient or authorized representative who has indicated his/her understanding and acceptance.     Dental advisory given  Plan Discussed with: CRNA  Anesthesia Plan Comments:        Anesthesia Quick Evaluation

## 2022-10-10 NOTE — Discharge Instructions (Addendum)
Go to [facility]. Urology will place a foley with a scope. Your prescriptions have been sent to the pharmacy here at [facility] and can be picked up in the morning. Please follow-up with urology to manage your retention and call radiation oncology if you need to reschedule your appointment.

## 2022-10-10 NOTE — Op Note (Signed)
Preoperative diagnosis:  1. Posterior urethral stenosis 2. History of TURP 3. Prostate cancer, receiving radiation  Postoperative diagnosis: same  Procedure(s): 1. Insertion of suprapubic tube 2. Cysto 3. cystogram  Surgeon: Dr. Donald Pore  Anesthesia: general  Complications: none  EBL: 10 cc  Specimens: none  Intraoperative findings:  blind ending urethra, posterior urethra, possibly bladder neck contracture Cytogram confirmed no bladder perforations/extrav after SP tube placement  Description of procedure:  With appropriate consent having been obtained the patient was brought to the operative suite where anesthesia was induced.  He was prepped and draped in the usual sterile fashion in a supine position.  Timeout was performed.  Exam I carefully inserting a 21 French rigid cystoscope through the urethra.  Once again I encountered a blind ending urethra proximal to the very.  I believe this may be a bladder neck contracture given the location.  I carefully attempted to pass a wire through this area but again was unsuccessful.  We then set about placing a suprapubic tube.  I placed the patient in some Trendelenburg.  I marked an entry point around 2 fingerbreadths above the pubic symphysis.  A spinal needle was inserted and I was able to aspirate urine.  I then used a scalpel to make a small opening in the skin and a hemostat to dilate open the subcutaneous tissues.  A trocar was then carefully inserted through the layers of the abdomen until there was return of urine.  A 14 French Foley was then carefully inserted through this trocar and the balloon was inflated with 10 cc of sterile water.  The trocar was then fully removed.  I used a Toomey syringe to hand irrigate out urine which was slightly blood-tinged.  I then performed a cystogram which confirmed the location of the tube within the bladder.  Also note there was no extravasation or evidence of intra or extraperitoneal  bladder perforation.  The suprapubic tube was then further secured with a silk suture.  Bag was then connected to the  Plan: Patient will be discharged home from recovery with plans to follow-up in 1 to 2 weeks in our clinic.  Donald Pore MD 10/10/2022, 7:02 PM  [facility]  Pager: [phone]

## 2022-10-10 NOTE — ED Notes (Addendum)
Daughter to desk, reports when pt got up to put his pants on he was able to void 410m. [facility] CLeonette Mon[facility] and at bedside. Receiving facility notified.

## 2022-10-10 NOTE — ED Notes (Signed)
Pt going to [facility] via [facility] with daughter. Paperwork given to daughter. INT secured for transport.

## 2022-10-10 NOTE — H&P (Signed)
H&P  History of Present Illness: Eric Stokes is a 77 y.o. year old M who presents today as an emergent OR add on. He presented to our clinic earlier today c/o urinary retention. I attempted to scope a wire in, but encountered a blind ending urethra.  Patient has a history of TURP and is currently undergoing radiation tx for prostate cancer.   Past Medical History:  Diagnosis Date   Aortic stenosis, mild    last echo in epic 05-27-2020  ef 40-45%,  mean grandiant 52mHg, valve area 2.46 cm^2 ,  mild regurg   BPH with obstruction/lower urinary tract symptoms    urologist--- dr eJunious Silk  Carotid bruit present 06/15/2018   2015 Carotid Dopplers:  Essentially normal carotid arteries, with very slight hard plaque in the proximal ICA's and serpentine distal vessels. 1-39% bilateral ICA stenosis. Patent vertebral arteries with antegrade flow. Normal subclavian arteries, bilaterally.   Chronic systolic CHF (congestive heart failure) ([facility] 05/2020   followed by dr bAve Filter  COPD (chronic obstructive pulmonary disease) ([facility]    Coronary artery disease 04/2020   cardiologist--- dr bHaroldine Laws   NSTEMI /  Afib w/ RVR/  Acute CHF/  cardiogenic shock 04-20-2020;  cath 05-17-2020 multivessel disease w/ critical LM;   05-18-2020 s/p CABG x4 and Maze procedure;  [facility], ef 54%   Essential hypertension, benign 12/21/2008   Qualifier: Diagnosis of  By: HPercival Spanish MD, FFarrel Gordon    Glaucoma, both eyes    History of migraine    History of non-ST elevation myocardial infarction (NSTEMI) 05/17/2020   cardiogenic shock,  AF w/ RVR, acute CHF,  ICM;   s/p CABG w/ MAZE   History of squamous cell carcinoma of skin    History of TIA (transient ischemic attack) 02/28/2019   questionable TIA  per neurology note dr sLeonie Man 04-12-2019, likely migraine   Hx of colonic polyps 01/09/2020   Hyperlipidemia    Ischemic cardiomyopathy 04/2020   per cath 09-03-20212  ef  25-30%;   last echo in epic 05-27-2020  ef 40-45%   Malignant neoplasm prostate ([facility] 10/2019   urologist--- dr eJunious Silk  dx 03/ 2021  active survillance   OA (osteoarthritis)    OSA on CPAP    [facility]    PAF (paroxysmal atrial fibrillation) ([facility] 04/2020   followed by cardiology---  s/p MAZE procedure 05-18-2020, takes ASA   PVC's (premature ventricular contractions)    S/P CABG x 4 05/18/2020   LIMA to LAD,  RIMA to PDA, SVG to OM1 and OM2   Seasonal allergic rhinitis    Sinus bradycardia    event monitor 01-29-2017  mild bradycardia, no pauses, rare PVCs   Vitamin B 12 deficiency    Vitamin D deficiency 11/26/2010   Wears hearing aid in both ears     Past Surgical History:  Procedure Laterality Date   CLIPPING OF ATRIAL APPENDAGE Left 05/18/2020   Procedure: CLIPPING OF ATRIAL APPENDAGE USING ATRICURE 486MM ATRICLIP;  Surgeon: AWonda Olds MD;  [place] [facility] [place]  Service: Open Heart Surgery;  Laterality: Left;   COLONOSCOPY     per pt last one approx 2019  by dr mCollene Mares  CORONARY ARTERY BYPASS GRAFT N/A 05/18/2020   Procedure: CORONARY ARTERY BYPASS GRAFTING (CABG) TIMES FOUR USING INTERNAL BILATERAL MAMMARY ARTERIES AND HARVESTED LEFT RADIAL ARTERY.;  Surgeon: AWonda Olds MD;  [place]  Service:  Open Heart Surgery;  Laterality: N/A;  CORONARY ENDARTERECTOMY PERFORMED DURING SURGERY    GOLD SEED IMPLANT N/A 08/26/2022   Procedure: GOLD SEED IMPLANT;  Surgeon: Ceasar Mons, MD;  Location[facility];  Service: Urology;  Laterality: N/A;   IABP INSERTION N/A 05/17/2020   Procedure: IABP INSERTION;  Surgeon: Jolaine Artist, MD;  Location: [place] [facility];  Service: Cardiovascular;  Laterality: N/A;   KNEE ARTHROPLASTY Right 03/14/2020   Procedure: COMPUTER ASSISTED TOTAL KNEE ARTHROPLASTY;  Surgeon: Rod Can, MD;  [facility];  Service: Orthopedics;  Laterality: Right;   LAPAROSCOPIC  CHOLECYSTECTOMY  02/26/2000   '@[facility]'$   by dr Margot Chimes   MAZE N/A 05/18/2020   Procedure: MAZE USING ATRICURE ISOLATOR CLAMP OLL2;  Surgeon: Wonda Olds, MD;  Location: [place];  Service: Open Heart Surgery;  Laterality: N/A;   RADIAL ARTERY HARVEST Left 05/18/2020   Procedure: RADIAL ARTERY HARVEST;  Surgeon: Wonda Olds, MD;  Location: [place];  Service: Open Heart Surgery;  Laterality: Left;   RIGHT HEART CATH N/A 05/17/2020   Procedure: RIGHT HEART CATH;  Surgeon: Jolaine Artist, MD;  Location: [place] [facility];  Service: Cardiovascular;  Laterality: N/A;   RIGHT/LEFT HEART CATH AND CORONARY ANGIOGRAPHY N/A 05/17/2020   Procedure: RIGHT/LEFT HEART CATH AND CORONARY ANGIOGRAPHY;  Surgeon: Lorretta Harp, MD;  Location: [place] [facility];  Service: Cardiovascular;  Laterality: N/A;   SPACE OAR INSTILLATION N/A 08/26/2022   Procedure: SPACE OAR INSTILLATION;  Surgeon: Ceasar Mons, MD;  [facility];  Service: Urology;  Laterality: N/A;  30 MINS FOR CASE   TEE WITHOUT CARDIOVERSION N/A 05/18/2020   Procedure: TRANSESOPHAGEAL ECHOCARDIOGRAM (TEE);  Surgeon: Wonda Olds, MD;  Location: [place];  Service: Open Heart Surgery;  Laterality: N/A;   THULIUM LASER TURP (TRANSURETHRAL RESECTION OF PROSTATE) N/A 04/15/2022   Procedure: Marcelino Duster LASER VAPORIZATION OF PROSTATE;  Surgeon: Festus Aloe, MD;  Location: [facility];  Service: Urology;  Laterality: N/A;  [place]   TRANSURETHRAL RESECTION OF PROSTATE N/A 04/15/2022    Home Medications:  No outpatient medications have been marked as taking for the 10/10/22 encounter [facility] Encounter).    Allergies:  Allergies  Allergen Reactions   Flomax [Tamsulosin] Other (See Comments)    dizziness   Cipro [Ciprofloxacin Hcl] Swelling    Knee swelling, joint pain   Lipitor [Atorvastatin] Other (See Comments)    Leg Cramps    Lisinopril Cough   Spironolactone Other (See Comments)    Family History  Problem Relation Age of Onset   Diabetes Mother    COPD Father    Stroke Paternal Grandfather     Social History:  reports that he quit smoking about 57 years ago. His smoking use included cigarettes. He has never used smokeless tobacco. He reports current alcohol use of about 2.0 - 6.0 standard drinks of alcohol per week. He reports that he does not use drugs.  ROS: A complete review of systems was performed.  All systems are negative except for pertinent findings as noted.  Physical Exam:  Vital signs in last 24 hours: Temp:  [97.6 F (36.4 C)] 97.6 F (36.4 C) (02/23 0301) Pulse Rate:  [36-67] 65 (02/23 0351) Resp:  [13-18] 16 (02/23 0100) BP: (110-134)/(64-93) 110/64 (02/23 0351) SpO2:  [96 %-100 %] 100 % (02/23 0351) Weight:  [99.8 kg] 99.8 kg (02/22 2234)  Constitutional:  Alert and oriented, No acute distress Cardiovascular: Regular rate and rhythm Respiratory: Normal respiratory effort, Lungs clear bilaterally GI: Abdomen is soft, nontender, nondistended, no abdominal masses Lymphatic: No lymphadenopathy Neurologic: Grossly intact, no focal deficits Psychiatric: Normal mood and affect   Laboratory Data:  Recent Labs    10/09/22 2250  WBC 4.6  HGB 13.2  HCT 38.2*  PLT 148*    Recent Labs    10/09/22 2250  NA 135  K 3.6  CL 103  GLUCOSE 135*  BUN 14  CALCIUM 9.5  CREATININE 0.83     Results for orders placed or performed during the hospital encounter of 10/09/22 (from the past 24 hour(s))  Basic metabolic panel     Status: Abnormal   Collection Time: 10/09/22 10:50 PM  Result Value Ref Range   Sodium 135 135 - 145 mmol/L   Potassium 3.6 3.5 - 5.1 mmol/L   Chloride 103 98 - 111 mmol/L   CO2 19 (L) 22 - 32 mmol/L   Glucose, Bld 135 (H) 70 - 99 mg/dL   BUN 14 8 - 23 mg/dL   Creatinine, Ser 0.83 0.61 - 1.24 mg/dL   Calcium 9.5 8.9 - 10.3 mg/dL   GFR, Estimated >60 >60  mL/min   Anion gap 13 5 - 15  CBC     Status: Abnormal   Collection Time: 10/09/22 10:50 PM  Result Value Ref Range   WBC 4.6 4.0 - 10.5 K/uL   RBC 4.38 4.22 - 5.81 MIL/uL   Hemoglobin 13.2 13.0 - 17.0 g/dL   HCT 38.2 (L) 39.0 - 52.0 %   MCV 87.2 80.0 - 100.0 fL   MCH 30.1 26.0 - 34.0 pg   MCHC 34.6 30.0 - 36.0 g/dL   RDW 13.3 11.5 - 15.5 %   Platelets 148 (L) 150 - 400 K/uL   nRBC 0.0 0.0 - 0.2 %  Urinalysis, Routine w reflex microscopic -Urine, Unspecified Source     Status: Abnormal   Collection Time: 10/09/22 10:59 PM  Result Value Ref Range   Color, Urine YELLOW YELLOW   APPearance CLEAR CLEAR   Specific Gravity, Urine 1.008 1.005 - 1.030   pH 5.5 5.0 - 8.0   Glucose, UA NEGATIVE NEGATIVE mg/dL   Hgb urine dipstick TRACE (A) NEGATIVE   Bilirubin Urine NEGATIVE NEGATIVE   Ketones, ur NEGATIVE NEGATIVE mg/dL   Protein, ur TRACE (A) NEGATIVE mg/dL   Nitrite NEGATIVE NEGATIVE   Leukocytes,Ua SMALL (A) NEGATIVE   RBC / HPF 0-5 0 - 5 RBC/hpf   WBC, UA 11-20 0 - 5 WBC/hpf   Bacteria, UA RARE (A) NONE SEEN   Squamous Epithelial / HPF 0-5 0 - 5 /HPF   Mucus PRESENT    No results found for this or any previous visit (from the past 240 hour(s)).  Renal Function: Recent Labs    10/09/22 2250  CREATININE 0.83   Estimated Creatinine Clearance: 86.8 mL/min (by C-G formula based on SCr of 0.83 mg/dL).  Radiologic Imaging: CT ABDOMEN PELVIS W CONTRAST  Result Date: 10/10/2022 CLINICAL DATA:  Left lower quadrant abdominal pain. EXAM: CT ABDOMEN AND PELVIS WITH CONTRAST TECHNIQUE: Multidetector CT imaging of the abdomen and pelvis was performed using the standard protocol following bolus administration of intravenous contrast. RADIATION DOSE REDUCTION: This exam was performed according to the departmental dose-optimization program which includes automated exposure control, adjustment of the mA and/or kV according to patient size and/or use of iterative reconstruction technique.  CONTRAST:  16m OMNIPAQUE IOHEXOL 300 MG/ML  SOLN COMPARISON:  CT abdomen pelvis dated 01/17/2021. FINDINGS: Lower chest: The visualized lung bases are clear. There is coronary vascular calcification. No intra-abdominal free air or free fluid. Hepatobiliary: Several small liver cyst and additional subcentimeter hypodense lesions which are too small to characterize. No biliary dilatation. Cholecystectomy. No retained calcified stone noted in the central CBD. Pancreas: Unremarkable. No pancreatic ductal dilatation or surrounding inflammatory changes. Spleen: Normal in size without focal abnormality. Adrenals/Urinary Tract: The adrenal glands unremarkable. There is no hydronephrosis on either side. There is symmetric enhancement and excretion of contrast by both kidneys. Bilateral renal cysts measure up to 4 cm in the inferior pole of the left kidney. Several additional subcentimeter hypodense lesions are too small to characterize. No imaging follow-up. The visualized ureters appear unremarkable. There is mild trabeculated appearance of the bladder wall, likely related to chronic bladder outlet obstruction. There is a 4.5 cm bladder dome diverticula. There is mild perivesical haziness. Correlation with urinalysis recommended to evaluate for cystitis. Stomach/Bowel: Small duodenal diverticulum without active inflammatory changes. Mild sigmoid diverticulosis without active inflammatory changes. There is no bowel obstruction or active inflammation. The appendix is normal. Vascular/Lymphatic: Moderate aortoiliac atherosclerotic disease. The IVC is unremarkable. No portal venous gas. There is no adenopathy. Reproductive: Mildly enlarged prostate gland measuring 5 cm in transverse axial diameter. The seminal vesicles are symmetric. Several biopsy markers noted. There is hazy appearance of the prostate gland which may be related to cystitis or prostatitis. Other: None Musculoskeletal: Degenerative changes of the spine. No  acute osseous pathology. IMPRESSION: 1. Perivesical and prostatic haziness may represent cystitis or prostatitis. Clinical correlation is recommended. 2. Mild sigmoid diverticulosis. No bowel obstruction. Normal appendix. 3.  Aortic Atherosclerosis (ICD10-I70.0). Electronically Signed   By: AAnner CreteM.D.   On: 10/10/2022 00:30    Assessment:  Eric WEARINGis a 77y.o. year old with posterior urethral stenosis, unable to place a catheter in clinic.   Plan:  To OR emergently for cysto in the OR, possible urethral dilation, possible suprapubic tube placement. We reviewed risks including hematuria, UTI, damage to adjacent structures including bowel, and need for future procedures. All questions answered  LDonald Pore MD 10/10/2022, 4[facility] Pager: [phone]

## 2022-10-10 NOTE — Anesthesia Procedure Notes (Addendum)
Procedure Name: Intubation Date/Time: 10/10/2022 6:18 PM  Performed by: Renato Shin, CRNAPre-anesthesia Checklist: Patient identified, Emergency Drugs available, Suction available and Patient being monitored Patient Re-evaluated:Patient Re-evaluated prior to induction Oxygen Delivery Method: Circle system utilized Preoxygenation: Pre-oxygenation with 100% oxygen Induction Type: IV induction Ventilation: Mask ventilation without difficulty Laryngoscope Size: Miller and 3 Grade View: Grade I Tube type: Oral Tube size: 7.5 mm Number of attempts: 1 Airway Equipment and Method: Stylet and Oral airway Placement Confirmation: ETT inserted through vocal cords under direct vision, positive ETCO2 and breath sounds checked- equal and bilateral Secured at: 21 cm Tube secured with: Tape Dental Injury: Teeth and Oropharynx as per pre-operative assessment

## 2022-10-10 NOTE — ED Notes (Signed)
Assuming care at this time. Respirations are equal and nonlabored. Skin warm and dry. Updated on current POC. Wife remains at bedside.

## 2022-10-10 NOTE — ED Notes (Signed)
Patient transported to CT 

## 2022-10-11 ENCOUNTER — Other Ambulatory Visit: Payer: Self-pay

## 2022-10-11 ENCOUNTER — Emergency Department (HOSPITAL_COMMUNITY)
Admission: EM | Admit: 2022-10-11 | Discharge: 2022-10-11 | Disposition: A | Discharge status: Home / Self Care | Payer: Medicare Other | Attending: Emergency Medicine | Admitting: Emergency Medicine

## 2022-10-11 ENCOUNTER — Other Ambulatory Visit (HOSPITAL_COMMUNITY): Payer: Self-pay

## 2022-10-11 DIAGNOSIS — Z7982 Long term (current) use of aspirin: Secondary | ICD-10-CM | POA: Insufficient documentation

## 2022-10-11 DIAGNOSIS — R339 Retention of urine, unspecified: Secondary | ICD-10-CM | POA: Insufficient documentation

## 2022-10-11 DIAGNOSIS — R1030 Lower abdominal pain, unspecified: Secondary | ICD-10-CM | POA: Diagnosis not present

## 2022-10-11 LAB — URINALYSIS, ROUTINE W REFLEX MICROSCOPIC
Bilirubin Urine: NEGATIVE
Glucose, UA: NEGATIVE mg/dL
Ketones, ur: NEGATIVE mg/dL
Leukocytes,Ua: NEGATIVE
Nitrite: NEGATIVE
Protein, ur: NEGATIVE mg/dL
Specific Gravity, Urine: 1.004 — ABNORMAL LOW (ref 1.005–1.030)
pH: 5 (ref 5.0–8.0)

## 2022-10-11 LAB — CBC WITH DIFFERENTIAL/PLATELET
Abs Immature Granulocytes: 0.02 10*3/uL (ref 0.00–0.07)
Basophils Absolute: 0 10*3/uL (ref 0.0–0.1)
Basophils Relative: 0 %
Eosinophils Absolute: 0 10*3/uL (ref 0.0–0.5)
Eosinophils Relative: 0 %
HCT: 34.3 % — ABNORMAL LOW (ref 39.0–52.0)
Hemoglobin: 11.7 g/dL — ABNORMAL LOW (ref 13.0–17.0)
Immature Granulocytes: 0 %
Lymphocytes Relative: 9 %
Lymphs Abs: 0.7 10*3/uL (ref 0.7–4.0)
MCH: 30.5 pg (ref 26.0–34.0)
MCHC: 34.1 g/dL (ref 30.0–36.0)
MCV: 89.6 fL (ref 80.0–100.0)
Monocytes Absolute: 0.5 10*3/uL (ref 0.1–1.0)
Monocytes Relative: 7 %
Neutro Abs: 6.9 10*3/uL (ref 1.7–7.7)
Neutrophils Relative %: 84 %
Platelets: 136 10*3/uL — ABNORMAL LOW (ref 150–400)
RBC: 3.83 MIL/uL — ABNORMAL LOW (ref 4.22–5.81)
RDW: 13.6 % (ref 11.5–15.5)
WBC: 8.2 10*3/uL (ref 4.0–10.5)
nRBC: 0 % (ref 0.0–0.2)

## 2022-10-11 LAB — URINE CULTURE: Culture: NO GROWTH

## 2022-10-11 LAB — BASIC METABOLIC PANEL
Anion gap: 8 (ref 5–15)
BUN: 16 mg/dL (ref 8–23)
CO2: 23 mmol/L (ref 22–32)
Calcium: 8.5 mg/dL — ABNORMAL LOW (ref 8.9–10.3)
Chloride: 105 mmol/L (ref 98–111)
Creatinine, Ser: 0.84 mg/dL (ref 0.61–1.24)
GFR, Estimated: >60 mL/min (ref >60)
Glucose, Bld: 106 mg/dL — ABNORMAL HIGH (ref 70–99)
Potassium: 3.6 mmol/L (ref 3.5–5.1)
Sodium: 136 mmol/L (ref 135–145)

## 2022-10-11 MED ORDER — ONDANSETRON HCL 4 MG/2ML IJ SOLN
4.0000 mg | Freq: Once | INTRAMUSCULAR | Status: AC
  Administered 2022-10-11: 4 mg via INTRAVENOUS
  Filled 2022-10-11: qty 2

## 2022-10-11 MED ORDER — MORPHINE SULFATE (PF) 4 MG/ML IV SOLN
4.0000 mg | Freq: Once | INTRAVENOUS | Status: AC
  Administered 2022-10-11: 4 mg via INTRAVENOUS
  Filled 2022-10-11: qty 1

## 2022-10-11 MED ORDER — SODIUM CHLORIDE 0.9 % IV BOLUS
500.0000 mL | Freq: Once | INTRAVENOUS | Status: AC
  Administered 2022-10-11: 500 mL via INTRAVENOUS

## 2022-10-11 NOTE — ED Notes (Signed)
Bladder scan >157

## 2022-10-11 NOTE — ED Provider Notes (Signed)
[facility] Provider Note   CSN: [id number] Arrival date & time: 10/11/22  1939     History  Chief Complaint  Patient presents with   Urinary Retention    Eric Stokes is a 77 y.o. male.  77 year old male with prior medical history as detailed below presents for evaluation.  Patient with suprapubic catheter placed yesterday after urinary retention/blind-ending urethra.  Catheter placed by Dr. Cain Sieve.  Patient reports significant decreased output from suprapubic catheter since around 2:00 this afternoon.  Patient reports that he was able to produce a small amount of urine from his urethra while in triage.  He complains of suprapubic pain and fullness.  He denies fever.  He did note scant clots in catheter prior to decreased output.  The history is provided by the patient and medical records.       Home Medications Prior to Admission medications   Medication Sig Start Date End Date Taking? Authorizing Provider  albuterol (VENTOLIN HFA) 108 (90 Base) MCG/ACT inhaler Inhale 1 puff into the lungs as needed for wheezing or shortness of breath. 09/26/15   [provider]  alfuzosin (UROXATRAL) 10 MG 24 hr tablet Take 10 mg by mouth at bedtime.    [provider]  aspirin 81 MG EC tablet Take 81 mg by mouth at bedtime.    [provider]  celecoxib (CELEBREX) 200 MG capsule Take 1 capsule (200 mg total) by mouth 2 (two) times daily as needed for up to 20 days for mild pain or moderate pain. 10/10/22 10/30/22  Vira Agar, MD  cephALEXin (KEFLEX) 500 MG capsule Take 1 capsule (500 mg total) by mouth 3 (three) times daily for 14 days. Patient not taking: Reported on 10/10/2022 10/10/22 10/24/22  Fatima Blank, MD  cephALEXin (KEFLEX) 500 MG capsule Take 1 capsule (500 mg total) by mouth 2 (two) times daily. 10/10/22   Vira Agar, MD  cetirizine (ZYRTEC) 10 MG tablet Take 10 mg by mouth at  bedtime. 01/16/13   [provider]  Cholecalciferol (VITAMIN D3) 2000 UNITS TABS Take 2,000 Units by mouth daily.    [provider]  dutasteride (AVODART) 0.5 MG capsule Take 0.5 mg by mouth daily.    [provider]  fluticasone (FLONASE) 50 MCG/ACT nasal spray USE TWO SPRAYS IN EACH NOSTRIL DAILY AS NEEDED. Patient taking differently: Place 2 sprays into both nostrils as needed for allergies. 05/02/20   Leamon Arnt, MD  ketotifen (ZADITOR) 0.025 % ophthalmic solution Place 1 drop into both eyes as needed (allergies). 12/12/20   [provider]  latanoprost (XALATAN) 0.005 % ophthalmic solution INSTILL 1 DROP INTO BOTH EYES AT BEDTIME Patient taking differently: Place 1 drop into both eyes daily. 08/30/20   Leamon Arnt, MD  losartan (COZAAR) 25 MG tablet TAKE 0.5 TABLETS BY MOUTH AT BEDTIME. Patient taking differently: Take 12.5 mg by mouth at bedtime. 06/06/22   Milford, Maricela Bo, FNP  montelukast (SINGULAIR) 10 MG tablet TAKE 1 TABLET BY MOUTH EVERY DAY AT BEDTIME Patient taking differently: Take 10 mg by mouth at bedtime. 05/27/20   Leamon Arnt, MD  NON FORMULARY Cpap at night    [provider]  oxyCODONE (ROXICODONE) 5 MG immediate release tablet Take 1 tablet (5 mg total) by mouth every 6 (six) hours as needed for severe pain. 10/10/22   Vira Agar, MD  rosuvastatin (CRESTOR) 20 MG tablet Take 1 tablet (20 mg  total) by mouth daily. 07/31/20   Bensimhon, Shaune Pascal, MD  sildenafil (VIAGRA) 100 MG tablet Take 0.5 tablets (50 mg total) by mouth daily as needed for erectile dysfunction. 10/18/20   Bensimhon, Shaune Pascal, MD      Allergies    Flomax [tamsulosin], Cipro [ciprofloxacin hcl], Lipitor [atorvastatin], Lisinopril, and Spironolactone    Review of Systems   Review of Systems  All other systems reviewed and are negative.   Physical Exam Updated Vital Signs BP (!) 172/94 (BP Location: Right Arm)   Pulse 74   Temp 97.9 F  (36.6 C) (Oral)   Resp 20   Ht '5\' 9"'$  (1.753 m)   Wt 99.8 kg   SpO2 96%   BMI 32.49 kg/m  Physical Exam Vitals and nursing note reviewed.  Constitutional:      General: He is not in acute distress.    Appearance: Normal appearance. He is well-developed.  HENT:     Head: Normocephalic and atraumatic.  Eyes:     Conjunctiva/sclera: Conjunctivae normal.     Pupils: Pupils are equal, round, and reactive to light.  Cardiovascular:     Rate and Rhythm: Normal rate and regular rhythm.     Heart sounds: Normal heart sounds.  Pulmonary:     Effort: Pulmonary effort is normal. No respiratory distress.     Breath sounds: Normal breath sounds.  Abdominal:     General: There is no distension.     Palpations: Abdomen is soft.     Tenderness: There is no abdominal tenderness.     Comments: Suprapubic catheter in place.  Minimal to no output in catheter bag.  Musculoskeletal:        General: No deformity. Normal range of motion.     Cervical back: Normal range of motion and neck supple.  Skin:    General: Skin is warm and dry.  Neurological:     General: No focal deficit present.     Mental Status: He is alert and oriented to person, place, and time.     ED Results / Procedures / Treatments   Labs (all labs ordered are listed, but only abnormal results are displayed) Labs Reviewed  BASIC METABOLIC PANEL  CBC WITH DIFFERENTIAL/PLATELET  URINALYSIS, ROUTINE W REFLEX MICROSCOPIC    EKG None  Radiology DG C-Arm 1-60 Min-No Report  Result Date: 10/10/2022 Fluoroscopy was utilized by the requesting physician.  No radiographic interpretation.   CT ABDOMEN PELVIS W CONTRAST  Result Date: 10/10/2022 CLINICAL DATA:  Left lower quadrant abdominal pain. EXAM: CT ABDOMEN AND PELVIS WITH CONTRAST TECHNIQUE: Multidetector CT imaging of the abdomen and pelvis was performed using the standard protocol following bolus administration of intravenous contrast. RADIATION DOSE REDUCTION: This  exam was performed according to the departmental dose-optimization program which includes automated exposure control, adjustment of the mA and/or kV according to patient size and/or use of iterative reconstruction technique. CONTRAST:  118m OMNIPAQUE IOHEXOL 300 MG/ML  SOLN COMPARISON:  CT abdomen pelvis dated 01/17/2021. FINDINGS: Lower chest: The visualized lung bases are clear. There is coronary vascular calcification. No intra-abdominal free air or free fluid. Hepatobiliary: Several small liver cyst and additional subcentimeter hypodense lesions which are too small to characterize. No biliary dilatation. Cholecystectomy. No retained calcified stone noted in the central CBD. Pancreas: Unremarkable. No pancreatic ductal dilatation or surrounding inflammatory changes. Spleen: Normal in size without focal abnormality. Adrenals/Urinary Tract: The adrenal glands unremarkable. There is no hydronephrosis on either side. There is symmetric enhancement and  excretion of contrast by both kidneys. Bilateral renal cysts measure up to 4 cm in the inferior pole of the left kidney. Several additional subcentimeter hypodense lesions are too small to characterize. No imaging follow-up. The visualized ureters appear unremarkable. There is mild trabeculated appearance of the bladder wall, likely related to chronic bladder outlet obstruction. There is a 4.5 cm bladder dome diverticula. There is mild perivesical haziness. Correlation with urinalysis recommended to evaluate for cystitis. Stomach/Bowel: Small duodenal diverticulum without active inflammatory changes. Mild sigmoid diverticulosis without active inflammatory changes. There is no bowel obstruction or active inflammation. The appendix is normal. Vascular/Lymphatic: Moderate aortoiliac atherosclerotic disease. The IVC is unremarkable. No portal venous gas. There is no adenopathy. Reproductive: Mildly enlarged prostate gland measuring 5 cm in transverse axial diameter. The  seminal vesicles are symmetric. Several biopsy markers noted. There is hazy appearance of the prostate gland which may be related to cystitis or prostatitis. Other: None Musculoskeletal: Degenerative changes of the spine. No acute osseous pathology. IMPRESSION: 1. Perivesical and prostatic haziness may represent cystitis or prostatitis. Clinical correlation is recommended. 2. Mild sigmoid diverticulosis. No bowel obstruction. Normal appendix. 3.  Aortic Atherosclerosis (ICD10-I70.0). Electronically Signed   By: Anner Crete M.D.   On: 10/10/2022 00:30    Procedures Procedures    Medications Ordered in ED Medications  sodium chloride 0.9 % bolus 500 mL (has no administration in time range)  morphine (PF) 4 MG/ML injection 4 mg (has no administration in time range)  ondansetron (ZOFRAN) injection 4 mg (has no administration in time range)    ED Course/ Medical Decision Making/ A&P                             Medical Decision Making Amount and/or Complexity of Data Reviewed Labs: ordered.  Risk Prescription drug management.    Medical Screen Complete  This patient presented to the ED with complaint of urinary retention.  This complaint involves an extensive number of treatment options. The initial differential diagnosis includes, but is not limited to, obstruction of suprapubic catheter, AKI, metabolic abnormality, etc.  This presentation is: Acute, Self-Limited, Previously Undiagnosed, Uncertain Prognosis, Complicated, Systemic Symptoms, and Threat to Life/Bodily Function  Patient reports decreased urine output from suprapubic catheter that was placed yesterday.  Catheter flushed and urine output noted to improve significantly.  Patient's complaint of suprapubic tenderness and fullness improved after flushing of catheter.    Screening labs obtained are without significant abnormality.  Patient and patient's daughter understand need for close outpatient follow-up with  urology.  Dr. Cain Sieve with Urology is aware of case and agrees with ED management.  Importance of close follow-up is stressed.  Strict return precautions given and understood.   Additional history obtained:  External records from outside sources obtained and reviewed including prior ED visits and prior Inpatient records.    Lab Tests:  I ordered and personally interpreted labs.  The pertinent results include: CBC, BMP, UA  Cardiac Monitoring:  The patient was maintained on a cardiac monitor.  I personally viewed and interpreted the cardiac monitor which showed an underlying rhythm of: NSR   Medicines ordered:  I ordered medication including Morphine, zofran  for pain  Reevaluation of the patient after these medicines showed that the patient: improved  Problem List / ED Course:  Urinary retention   Reevaluation:  After the interventions noted above, I reevaluated the patient and found that they have: improved   Disposition:  After consideration of the diagnostic results and the patients response to treatment, I feel that the patent would benefit from close outpatient follow up.          Final Clinical Impression(s) / ED Diagnoses Final diagnoses:  Urinary retention    Rx / DC Orders ED Discharge Orders     None         Valarie Merino, MD 10/11/22 2251

## 2022-10-11 NOTE — Discharge Instructions (Addendum)
Return for any problem.   Follow up with [facility] as instructed.

## 2022-10-11 NOTE — ED Triage Notes (Signed)
Pt had emergent suprapubic cath placed yesterday and today noticed it was not draining.  Did note clots in the catheter earlier.

## 2022-10-12 ENCOUNTER — Encounter (HOSPITAL_COMMUNITY): Payer: Self-pay | Admitting: Urology

## 2022-10-12 NOTE — Anesthesia Postprocedure Evaluation (Signed)
Anesthesia Post Note  Patient: [facility]  Procedure(s) Performed: INSERTION OF SUPRAPUBIC CATHETER cystoscopy urethral dilation     Patient location during evaluation: PACU Anesthesia Type: General Level of consciousness: awake and alert Pain management: pain level controlled Vital Signs Assessment: post-procedure vital signs reviewed and stable Respiratory status: spontaneous breathing, nonlabored ventilation, respiratory function stable and patient connected to nasal cannula oxygen Cardiovascular status: blood pressure returned to baseline and stable Postop Assessment: no apparent nausea or vomiting Anesthetic complications: no  No notable events documented.  Last Vitals:  Vitals:   10/10/22 2034 10/10/22 2035  BP:  121/72  Pulse: 67 66  Resp:  18  Temp: (!) 36.4 C (!) 36.4 C  SpO2:  98%    Last Pain:  Vitals:   10/10/22 2035  TempSrc:   PainSc: 2                  Breean Nannini L Norvil Martensen

## 2022-10-13 ENCOUNTER — Ambulatory Visit
Admission: RE | Admit: 2022-10-13 | Discharge: 2022-10-13 | Disposition: A | Discharge status: Home / Self Care | Payer: No Typology Code available for payment source | Source: Ambulatory Visit | Attending: Radiation Oncology | Admitting: Radiation Oncology

## 2022-10-13 ENCOUNTER — Other Ambulatory Visit (HOSPITAL_BASED_OUTPATIENT_CLINIC_OR_DEPARTMENT_OTHER): Payer: Self-pay

## 2022-10-13 ENCOUNTER — Other Ambulatory Visit: Payer: Self-pay

## 2022-10-13 ENCOUNTER — Ambulatory Visit: Payer: No Typology Code available for payment source

## 2022-10-13 DIAGNOSIS — Z510 Encounter for antineoplastic radiation therapy: Secondary | ICD-10-CM | POA: Diagnosis not present

## 2022-10-13 LAB — RAD ONC ARIA SESSION SUMMARY
Course Elapsed Days: 35
Plan Fractions Treated to Date: 24
Plan Prescribed Dose Per Fraction: 2.5 Gy
Plan Total Fractions Prescribed: 28
Plan Total Prescribed Dose: 70 Gy
Reference Point Dosage Given to Date: 60 Gy
Reference Point Session Dosage Given: 2.5 Gy
Session Number: 24

## 2022-10-14 ENCOUNTER — Encounter: Payer: Self-pay | Admitting: Radiation Oncology

## 2022-10-14 ENCOUNTER — Ambulatory Visit: Payer: No Typology Code available for payment source

## 2022-10-14 ENCOUNTER — Ambulatory Visit
Admission: RE | Admit: 2022-10-14 | Discharge: 2022-10-14 | Disposition: A | Discharge status: Home / Self Care | Payer: No Typology Code available for payment source | Source: Ambulatory Visit | Attending: Radiation Oncology | Admitting: Radiation Oncology

## 2022-10-14 ENCOUNTER — Other Ambulatory Visit: Payer: Self-pay

## 2022-10-14 DIAGNOSIS — Z510 Encounter for antineoplastic radiation therapy: Secondary | ICD-10-CM | POA: Diagnosis not present

## 2022-10-14 LAB — RAD ONC ARIA SESSION SUMMARY
Course Elapsed Days: 36
Plan Fractions Treated to Date: 25
Plan Prescribed Dose Per Fraction: 2.5 Gy
Plan Total Fractions Prescribed: 28
Plan Total Prescribed Dose: 70 Gy
Reference Point Dosage Given to Date: 62.5 Gy
Reference Point Session Dosage Given: 2.5 Gy
Session Number: 25

## 2022-10-15 ENCOUNTER — Ambulatory Visit
Admission: RE | Admit: 2022-10-15 | Discharge: 2022-10-15 | Disposition: A | Discharge status: Home / Self Care | Payer: No Typology Code available for payment source | Source: Ambulatory Visit | Attending: Radiation Oncology | Admitting: Radiation Oncology

## 2022-10-15 ENCOUNTER — Ambulatory Visit: Payer: No Typology Code available for payment source

## 2022-10-15 ENCOUNTER — Other Ambulatory Visit: Payer: Self-pay

## 2022-10-15 DIAGNOSIS — Z510 Encounter for antineoplastic radiation therapy: Secondary | ICD-10-CM | POA: Diagnosis not present

## 2022-10-15 LAB — RAD ONC ARIA SESSION SUMMARY
Course Elapsed Days: 37
Plan Fractions Treated to Date: 26
Plan Prescribed Dose Per Fraction: 2.5 Gy
Plan Total Fractions Prescribed: 28
Plan Total Prescribed Dose: 70 Gy
Reference Point Dosage Given to Date: 65 Gy
Reference Point Session Dosage Given: 2.5 Gy
Session Number: 26

## 2022-10-16 ENCOUNTER — Other Ambulatory Visit: Payer: Self-pay

## 2022-10-16 ENCOUNTER — Ambulatory Visit
Admission: RE | Admit: 2022-10-16 | Discharge: 2022-10-16 | Disposition: A | Discharge status: Home / Self Care | Payer: No Typology Code available for payment source | Source: Ambulatory Visit | Attending: Radiation Oncology | Admitting: Radiation Oncology

## 2022-10-16 ENCOUNTER — Ambulatory Visit: Payer: No Typology Code available for payment source

## 2022-10-16 DIAGNOSIS — Z510 Encounter for antineoplastic radiation therapy: Secondary | ICD-10-CM | POA: Diagnosis not present

## 2022-10-16 LAB — RAD ONC ARIA SESSION SUMMARY
Course Elapsed Days: 38
Plan Fractions Treated to Date: 27
Plan Prescribed Dose Per Fraction: 2.5 Gy
Plan Total Fractions Prescribed: 28
Plan Total Prescribed Dose: 70 Gy
Reference Point Dosage Given to Date: 67.5 Gy
Reference Point Session Dosage Given: 2.5 Gy
Session Number: 27

## 2022-10-17 ENCOUNTER — Encounter: Payer: Self-pay | Admitting: Urology

## 2022-10-17 ENCOUNTER — Ambulatory Visit
Admission: RE | Admit: 2022-10-17 | Discharge: 2022-10-17 | Disposition: A | Discharge status: Home / Self Care | Payer: No Typology Code available for payment source | Source: Ambulatory Visit | Attending: Radiation Oncology | Admitting: Radiation Oncology

## 2022-10-17 ENCOUNTER — Ambulatory Visit: Payer: No Typology Code available for payment source

## 2022-10-17 ENCOUNTER — Other Ambulatory Visit: Payer: Self-pay | Admitting: Urology

## 2022-10-17 ENCOUNTER — Other Ambulatory Visit: Payer: Self-pay

## 2022-10-17 DIAGNOSIS — Z510 Encounter for antineoplastic radiation therapy: Secondary | ICD-10-CM | POA: Insufficient documentation

## 2022-10-17 DIAGNOSIS — C61 Malignant neoplasm of prostate: Secondary | ICD-10-CM | POA: Diagnosis present

## 2022-10-17 LAB — RAD ONC ARIA SESSION SUMMARY
Course Elapsed Days: 39
Plan Fractions Treated to Date: 28
Plan Prescribed Dose Per Fraction: 2.5 Gy
Plan Total Fractions Prescribed: 28
Plan Total Prescribed Dose: 70 Gy
Reference Point Dosage Given to Date: 70 Gy
Reference Point Session Dosage Given: 2.5 Gy
Session Number: 28

## 2022-10-20 NOTE — Progress Notes (Signed)
Patient was a RadOnc Consult on 05/07/22 for his stage T1c adenocarcinoma of the prostate with Gleason Score of 3+4, and PSA of 4.6.  Patient proceed with treatment recommendations of 5.5 weeks of external beam therapy  and had his final radiation treatment on 3/1.   Patient is scheduled for a post treatment nurse call on 4/2 and continues to see his urologist, Dr. Junious Silk.

## 2022-10-23 NOTE — Progress Notes (Addendum)
   COVID Vaccine received:  '[]'$  No '[x]'$  Yes Date of any COVID positive Test in last 90 days:  PCP - Kristopher Oppenheim, PA at [facility] clinic Cardiologist - Glori Bickers, MD  Chest x-ray - 04-24-2022  epic EKG -  04-28-2022  epic Stress Test -  ECHO - 05-27-2020 Cardiac Cath - 05-17-2020  epic      PCR screen: '[]'$  Ordered & Completed                      '[]'$   No Order but Needs PROFEND                      '[x]'$   N/A for this surgery  Surgery Plan:  '[x]'$  Ambulatory                            '[]'$  Outpatient in bed                            '[]'$  Admit  Anesthesia:    '[x]'$  General  '[]'$  Spinal                           '[]'$   Choice '[]'$   MAC  Bowel Prep - '[x]'$  No  '[]'$   Yes ______  Pacemaker / ICD device '[x]'$  No '[]'$  Yes        Device order form faxed '[x]'$  No    '[]'$   Yes      Faxed to:  Spinal Cord Stimulator:'[x]'$  No '[]'$  Yes      (Remind patient to bring remote DOS) Other Implants:   History of Sleep Apnea? '[]'$  No '[x]'$  Yes   CPAP used?- '[]'$  No '[x]'$  Yes    Does the patient monitor blood sugar? '[]'$  No '[]'$  Yes  '[x]'$  N/A  Blood Thinner / Instructions:none Aspirin Instructions:  ASA 81 mg    hold 7 days  ERAS Protocol Ordered: '[x]'$  No  '[]'$  Yes Patient is to be NPO after: midnight prior  Comments: Therapist, occupational at [employer].  PST should have been a phone visit today, Patient was upset that he had to come in to the office. Tamika spoke to the patient about our phone interview process. Patient was given his CHG and surgery instructions. Per patient, there were no changes to his medical or surgical history. Medications were reconciled.  Activity level: Patient is unable to climb a flight of stairs without difficulty  Patient can not perform ADLs without assistance.   Anesthesia review: CAD- NSTEMI (CABG/MAZE 05-18-2020), CHF, OSA-CPAP, HTN, COPD, Hx TIA, HOH wears HAs  Patient denies shortness of breath, fever, cough and chest pain at PAT appointment.  Patient verbalized understanding and  agreement to the Pre-Surgical Instructions that were given to them at this PAT appointment. Patient was also educated of the need to review these PAT instructions again prior to his surgery.I reviewed the appropriate phone numbers to call if they have any and questions or concerns.

## 2022-10-23 NOTE — Patient Instructions (Addendum)
SURGICAL WAITING ROOM VISITATION Patients having surgery or a procedure may have no more than 2 support people in the waiting area - these visitors may rotate in the visitor waiting room.   Due to an increase in RSV and influenza rates and associated hospitalizations, children ages 31 and under may not visit patients in [place]. If the patient needs to stay at the hospital during part of their recovery, the visitor guidelines for inpatient rooms apply.  PRE-OP VISITATION  Pre-op nurse will coordinate an appropriate time for 1 support person to accompany the patient in pre-op.  This support person may not rotate.  This visitor will be contacted when the time is appropriate for the visitor to come back in the pre-op area.  Please refer to the [facility] website for the visitor guidelines for Inpatients (after your surgery is over and you are in a regular room).  You are not required to quarantine at this time prior to your surgery. However, you must do this: Hand Hygiene often Do NOT share personal items Notify your provider if you are in close contact with someone who has COVID or you develop fever 100.4 or greater, new onset of sneezing, cough, sore throat, shortness of breath or body aches.  If you test positive for Covid or have been in contact with anyone that has tested positive in the last 10 days please notify you surgeon.    Your procedure is scheduled on:  Tuesday   October 28, 2022  Report to [facility] Main Entrance: [place] entrance where the [employer] is available.   Report to admitting at: 9:30    AM  +++++Call this number if you have any questions or problems the morning of surgery [phone]  DO NOT EAT OR DRINK ANYTHING AFTER MIDNIGHT THE NIGHT PRIOR TO YOUR SURGERY / PROCEDURE.   FOLLOW BOWEL PREP AND ANY ADDITIONAL PRE OP INSTRUCTIONS YOU RECEIVED FROM YOUR SURGEON'S OFFICE!!!   Oral Hygiene is also important to reduce your risk of  infection.        Remember - BRUSH YOUR TEETH THE MORNING OF SURGERY WITH YOUR REGULAR TOOTHPASTE  Do NOT smoke after Midnight the night before surgery.  Take ONLY these medicines the morning of surgery with A SIP OF WATER:dutasteride (Avodart), cephalexin (Keflex).  You may use your Flonase nasal spray and the Albuterol inhaler if needed. You may take oxycodone if needed.     If You have been diagnosed with Sleep Apnea - Bring CPAP mask and tubing day of surgery. We will provide you with a CPAP machine on the day of your surgery.                   You may not have any metal on your body including jewelry, and body piercing  Do not wear lotions, powders, cologne, or deodorant  Men may shave face and neck.  Contacts, Hearing Aids, dentures or bridgework may not be worn into surgery. DENTURES WILL BE REMOVED PRIOR TO SURGERY PLEASE DO NOT APPLY "Poly grip" OR ADHESIVES!!!  Patients discharged on the day of surgery will not be allowed to drive home.  Someone NEEDS to stay with you for the first 24 hours after anesthesia.  Do not bring your home medications to the hospital. The Pharmacy will dispense medications listed on your medication list to you during your admission in the Hospital.  Special Instructions: Bring a copy of your healthcare power of attorney and living will documents  the day of surgery, if you wish to have them scanned into your [facility] Records- EPIC  Please read over the following fact sheets you were given: IF YOU HAVE QUESTIONS ABOUT YOUR [place], [place] [phone].   [place] - Preparing for Surgery Before surgery, you can play an important role.  Because skin is not sterile, your skin needs to be as free of germs as possible.  You can reduce the number of germs on your skin by washing with CHG (chlorahexidine gluconate) soap before surgery.  CHG is an antiseptic cleaner which kills germs and bonds with the skin to continue killing germs  even after washing. Please DO NOT use if you have an allergy to CHG or antibacterial soaps.  If your skin becomes reddened/irritated stop using the CHG and inform your nurse when you arrive at Short Stay. Do not shave (including legs and underarms) for at least 48 hours prior to the first CHG shower.  You may shave your face/neck.  Please follow these instructions carefully:  1.  Shower with CHG Soap the night before surgery and the  morning of surgery.  2.  If you choose to wash your hair, wash your hair first as usual with your normal  shampoo.  3.  After you shampoo, rinse your hair and body thoroughly to remove the shampoo.                             4.  Use CHG as you would any other liquid soap.  You can apply chg directly to the skin and wash.  Gently with a scrungie or clean washcloth.  5.  Apply the CHG Soap to your body ONLY FROM THE NECK DOWN.   Do not use on face/ open                           Wound or open sores. Avoid contact with eyes, ears mouth and genitals (private parts).                       Wash face,  Genitals (private parts) with your normal soap.             6.  Wash thoroughly, paying special attention to the area where your  surgery  will be performed.  7.  Thoroughly rinse your body with warm water from the neck down.  8.  DO NOT shower/wash with your normal soap after using and rinsing off the CHG Soap.            9.  Pat yourself dry with a clean towel.            10.  Wear clean pajamas.            11.  Place clean sheets on your bed the night of your first shower and do not  sleep with pets.  ON THE DAY OF SURGERY : Do not apply any lotions/deodorants the morning of surgery.  Please wear clean clothes to the hospital/surgery center.    FAILURE TO FOLLOW THESE INSTRUCTIONS MAY RESULT IN THE CANCELLATION OF YOUR SURGERY  PATIENT SIGNATURE_________________________________  NURSE  SIGNATURE__________________________________  ________________________________________________________________________

## 2022-10-24 ENCOUNTER — Encounter (HOSPITAL_COMMUNITY): Payer: Self-pay

## 2022-10-24 ENCOUNTER — Encounter (HOSPITAL_COMMUNITY)
Admission: RE | Admit: 2022-10-24 | Discharge: 2022-10-24 | Disposition: A | Discharge status: Home / Self Care | Payer: No Typology Code available for payment source | Source: Ambulatory Visit | Attending: Urology

## 2022-10-27 NOTE — H&P (Signed)
Office Visit Report     10/14/2022   --------------------------------------------------------------------------------   Marcello Moores L. Neugebauer  MRN: [id number]  DOB: 06/30/1946, 77 year old Male  SSN: [id number]   PRIMARY CARE:     REFERRING:  Ammie Dalton, NP  PROVIDER:  Festus Aloe, M.D.  LOCATION:  [facility] [phone]     --------------------------------------------------------------------------------   CC/HPI: F/u -   1) BPH - 02/16/2012. TLVP in Aug 2023. On alfuzosin and on/off 5ari. Prostate 106 g and down to 76 g on CT 06/22 and 79 g on pMRI 12/22. AUASS was 12 in 2019. Not bothered. Weak stream and frequency. He was rx'd flomax but didn't want to take meds - continued surveillance.   He was seen July 2020 with frequency and dysuria. Urine culture grew Staph and he was treated with doxycycline. He started tamsulosin. His postvoid was up to 667 and a catheter was placed. He passed a voiding trial. He had severe dizziness and change to alfuzosin but even then cut it to 5 mg BID. Prostate ultrasound was recommended the guide therapy but he did not follow up. He tried to stop alfuzosin 5 mg BID but had a slow stream. Another cx grew Serratia and Staph sensitive to Bactrim and Cipro. He has frequency and urgency. UA with MH and wbc. PVR better at 124 cc.   He underwent CT 08/23/2019 with a 95 gram prostate with multiple abnormal, enlarged bilateral pelvic sidewall, iliac, and retroperitoneal lymph nodes, largest right pelvic sidewall lymph node measuring 1.5 x 1.3 cm (series 5, image 74, 77, 57). and benign. PSA was 8.64. Cysto benign - good Urolift or Rezum candidate. Prostate 106 g on TRUS 10/2019.   F/u PVR 162 ml. He had some mild dysuria penile irritation. UA 12/23/2019 and cx negative. UA clear. Started finasteride 05/21 with [facility] and they considered TURP. His PVR decreased to 87 ml. Pt has considered it and wants to continue finasteride and alfuzosin  BID and voiding better. He would consider but like to avoid BPH tx if possible. PVR 01/22, 115 ml. UA clear. He's lost 25 lbs since surgery (CABG) 10/21.   He's on alfuzosin now QD. He also takes finasteride, but stopped finasteride early 03/22. CT 06/22 with prostate 76 g. Breast tenderness better. No dysuria. Cysto 07/22 with long prostate channel (6-6.5 cm), no significant median lobe on cystoscopy. I kept him on nightly TMP x 45 days for bacteriuria causing some dysuria from 07/22 x 6 weeks. Dysuria and frequency returned. His PCP stopped jardiance. Stopped spironolactone. Asked about 5ari restart and rx dutasteride Oct 2022 which he started. Prostate was 79 g on MRI 12/22. He has some occasional dysuria.   Remains on dutasteride. AUASS = 14.    2) PCa - being treated 5.5 weeks external beam. Pt had gold seed and spaceoar 08/26/2022. He went to [facility] clinic - CT reported as decreased LAD. They wanted to continue AS for a while and see him back at [facility] [place]. Started finasteride and considered TURP prior to Tx 05/21. PSA now 2.6 (5.2) on finasteride. He has considered it and wants to continue AS for as long as possible. Otherwise he would consider BPH tx (if still needed) and then XRT.   PSA down to 1.5 (3) 01/22 on 5ari. He had CABG 10/21. Still sore and some breast tenderness (5ari vs sternotomy?). He reports 12/21 MRI showed "no imperfections" and prostate down to 70 grams. They will continue  AS. He stopped 5ari earl 03/22 with PSA 2.13 (4.2). The breast tenderness settled down and testicles sore. However, he stopped spironolactone and breast tenderness improved. Asked about restart 5ari.   07/22 PSA 3.45 - off 5ari. His 09/22 PSA remains normal at 3.17 p abx. His was rx 5ari 10/22 and psa 2.6 12/22 and 2.29 (4.6) in Apr 2023.   Biopsy:  #17 Oct 2019 - Low risk:  PSA 8.64  T1c  Prostate 106 grams  Gleason 3+3=6, one core, 30%, right apex   #17 Jan 2022  PSA 2.29 (4.6)  T1c   Prostate 83 g (67-99 g +/- median lobe)  GG2 , left base (5%), one core  GG2 right anterior apical ROI (80-90%), two cores  3/14   Staging:  Jan 2021 CT - a 95 gram prostate with multiple abnormal, enlarged bilateral pelvic sidewall, iliac, and retroperitoneal lymph nodes, largest right pelvic sidewall lymph node measuring 1.5 x 1.3 cm (series 5, image 74, 77, 57). and benign.  Dec 2021 pMRI - [facility] [place] - reported as normal and prostate 70 g  Jun 2022 CT - no LAD (likely above LAD reactive), no bone lesions - prostate 76 g after 5ari  Dec 2022 pMRI - a PIRADs 3 lesion right ant PZ, negative staging (pelvic lymph nodes noted to be 0.8 cm). Prostate 79 grams.     3) ED - noticed trouble getting and maintaining erection since gradually since 2021. Good libido. In cardiac rehab and will ask cardiologist about pde5i. His cardiologist put him on sildenafil 100 mg which he can cut into 1/2s or 1/4s. His 07/22 T was 480.    4) gross hematuria - pt developed red/cranberry urine 05/22. No fever or dysuria. Intermittent blood this well. No clots. UA with many bacteria. Cx returned p he left + for enterobacter. Abx sent. 01/21 CT and cysto benign w/u for BPH.   Repeat CT 06/22 with BPH, bladder tic and BWT. LAD decreased (reactive). Prostate 76 g. Cysto 07/22 benign with cloudy urine. Last cx enterobacter and we think a chronic cystitis might be contributing to his LUTS and hematuria.   Today, seen for the above. In IMRT treatments right now - last treatment Oct 17, 2022. He continued dutasteride and alfuzosin (tried to stop but restarted). PVR 125 ml. Some nights 1-2 some 3-4. He underwent TLVP Aug 2023. He had urinary retention 10/09/2022 - foley could not be placed in ED, but he then voided 800 ml. He then went into retention again 10/10/2022. Foley could not be placed here. Dr. Cain Sieve did cystiscopy and found a blind ending urethra he though proximal to the veru. He placed an SP tube. His SP tube clogged  and of note he did void 400 ml. ED irrigated the foley. He's also getting bladder spasm and voiding a small amount. Urine cx was negative. He is on cephalexin now. Suprapubic tube draining well and he is getting the hang of caring for it.     ALLERGIES: Cipro - knee swelling, joint pain finasteride - breast tenderness Lipitor TABS Lisinopril TABS Tamsulosin - Dizziness (Severe)    MEDICATIONS: Albuterol Sulfate Hfa 90 mcg hfa aerosol with adapter  Alfuzosin Hcl Er 10 mg tablet, extended release 24 hr 1 tablet PO Q HS  Cetirizine Hcl 10 mg tablet  Cpap  Fluticasone Propionate 50 mcg/actuation spray, suspension  Ketotifen Fumarate 0.025 % (0.035 %) drops  Latanoprost 0.005 % drops  Losartan Potassium 25 mg tablet  Montelukast Sodium 10 mg  tablet  Rosuvastatin Calcium 20 mg tablet  Sildenafil Citrate 100 mg tablet     GU PSH: Cystoscopy - 02/22/2021, 2021 Drain Bl W/Cath Insertion - 10/10/2022 Inject For cystogram - 10/10/2022 Laser Surgery Prostate - 04/15/2022 Locm 300-'399Mg'$ /Ml Iodine,1Ml - 01/17/2021, 2021 PLACE RT DEVICE/MARKER, PROS - 08/26/2022 Prostate Needle Biopsy - 01/17/2022, 2021 Transperineal Plmt Biodegradable Matrl 1/Mlt Njx - 08/26/2022       PSH Notes: Cholecystectomy   NON-GU PSH: CABG (coronary artery bypass grafting) Cholecystectomy (open) - 2013 Surgical Pathology, Gross And Microscopic Examination For Prostate Needle - 01/17/2022, 2021 Total Knee Replacement, Right     GU PMH: Urinary Retention (Stable) - 10/10/2022, - 2020 Acute Cystitis/UTI - 09/09/2022, - 05/20/2022, - 05/01/2022 BPH w/LUTS, cont meds for now . Mild dysuria. I sent for cx - 08/13/2022, - 05/20/2022, - 05/01/2022, - 04/22/2022, Patient is here with his daughter that works over at [employer] and is a Designer, jewellery in Nutritional therapist. As far as BPH we went over the nature risk benefits and alternatives to thulium laser vaporization of the prostate. Given his cardiac history I do think there is a lower risk of  bleeding with the laser although we discussed sometimes we have to convert to TURP. We will proceed with the laser. We also discussed extensively the timing of a BPH procedure versus radiation and vice versa. I discussed with the patient the nature r/b/a to laser vaporization of prostate including side effects of the procedure, expected post-op course and likelihood of success. We discussed flow symptoms and irritative symptoms typically improve, but frequency and urgency can persist and rarely worsen. We also discussed risk of bleeding, infection, stricture, sexual dysfunction and incontinence among others. We also discussed expectations for a staged or repeat procedure in the future. All questions answered. He elects to proceed. Given his cardiac history we will go ahead and get cardiac clearance as well., - 03/31/2022, We discussed the nature r/b of surveillance, alpha blocker, 5ari, daily pdei, OAB meds and procedures such as TURP, LVP, LEP, RWJ, RSLP in the OR or PUL or WVT in the office. We will plan to set him up for aqua ablation and if not available for thulium laser vaporization of the prostate possible TURP. , - 01/23/2022, Disc meds and procedures. , - 12/12/2021, he wants to continue alf and dutasteride and "shrink" the prostate. Urine for cx. , - 08/23/2021, He asked about 5ari and would like to take a med prior to doing any procedures. Disc again nature r/b/a to 5ari and FDA warnings. Dutasteride rx sent. Can use finasteride. , - 05/23/2021, - 02/22/2021, disc again nature r/b/a to 5ari and FDA warnings. , - 01/04/2021, He is considering a prostate procedure. , - 11/15/2020, cont alf and finast , - 2022, Disc again nature r/b of alpha blocker and 5ari again. Will cont med therapy. , - 04/26/2020, will start finasteride. , - 2021, We also discussed all manner of BPH tx -- meds, procedures and went over the anatomy, nature r/b of each. He will consider and review with family. I worte these down in the PCa book. He  has some frequency. , - 2021, - 2021, pvr better on alfuzosin 5 bid - continue. Consider 5ari or procedure if needed. , - 2021, - 2020, - 2020, - 2020, - 2020, - 2019, - 2018, Benign localized prostatic hyperplasia with lower urinary tract symptoms (LUTS), - 2017 Incomplete bladder emptying - 08/13/2022, - 05/01/2022, - 04/22/2022, UA sent for urinalysis and culture., - 03/31/2022, -  01/23/2022, - 12/12/2021, - 08/23/2021, - 05/23/2021, - 02/22/2021, - 01/04/2021, - 2022, - 2020 Prostate Cancer, PSA was sent. See back in 3 mo with PSA prior. I drew him a picture of the anatomy and the nature r/b/a to gold seed and spaceoar gel. Also disc enema and a colleague likely doing the case. - 08/13/2022, Given that his daughter is here for the first appointment we went over again his PSA levels, his grade stage and prognosis of both of his biopsies as well as his MRI results. She had the pathology printed. We went over the nature risk benefits and alternatives to radiation. We discussed again continued active surveillance versus surgery for the prostate. All questions answered. I will refer him to Dr. Tammi Klippel for consultation., - 03/31/2022, I had a long discussion with the patient using the understanding prostate cancer booklet and his path report as a reference. We discussed his stage, grade and prognosis. We discussed the nature risks and benefits of active surveillance, radical prostatectomy, external beam radiotherapy, and brachytherapy. We discussed the role of androgen deprivation and chemotherapy in prostate cancer. We also discussed other ablative techniques such as HiFU and cryotherapy as well as whole gland versus focal treatment. We discussed specifically how each treatment might affect bowel, bladder and sexual function. We discussed how each treatment might effect salvage treatments and active surveillance might lead to progression and more difficult treatment in the future. All questions answered. He has had progression  of his grade to a slight degree as well as an increased percentage of cores. He is interested in radiation either external beam or brachy. Once we have dealt with the BPH we will reassess his PSA and then get him over to radiation oncology., - 01/23/2022, Tolerated well. 14 bx done. DRE nl. Prostate 83 g (67-99 g +/- median lobe), - 01/17/2022, We discussed the MRI and PSA levels. Discussed nature r/b/a to TRUS surveillance bx and he would like to proceed. He prefers the "pain of a biopsy" vs the "mental" anguish of AS. Once we get scheduled I'll send in diazepam and abx to [facility]. , - 12/12/2021, Disc MRI. Stable. Disc biopsy of prostate. He really favors AS. He would be interested in brachy if we can "shrink" the prostate. He felt the biopsy was "invasive" and wants to avoid. , - 08/23/2021, PSA remains low -, - 05/23/2021, PSA up off 5ari. CT with resolution of LAD. , - 02/22/2021, Check T, E and PSA off 5ari in 3 mo. We discussed the nature r/b/ of MRI vs bx. , - 11/15/2020, breast tenderness - consider serm, aromatase inhibitor . Check PSA, T and E next time. Also, pt to go by and get MRI report from [place]., - 2022, MRI pending. Discussed again AS vs tx with XRT. He would like to continue AS for as long as possible. , - 04/26/2020, See back in 3 mo with a PSA , - 2021, I had a long discussion with the patient using the understanding prostate cancer booklet and his path report as a reference. We discussed his stage, grade and prognosis. We discussed the nature risks and benefits of active surveillance, radical prostatectomy, external beam radiotherapy, and brachytherapy. We discussed the role of androgen deprivation and chemotherapy in prostate cancer. We also discussed other ablative techniques such as HiFU and cryotherapy as well as whole gland versus focal treatment. We discussed specifically how each treatment might affect bowel, bladder and sexual function. We discussed how each treatment might effect salvage  treatments and active surveillance might lead to progression and more difficult treatment in the future. All questions answered. Will continue surveillance of PSA and consider further CT in future (LAD) and possibly a PET scan. We talked about the LAD doesn't correlate with what we are seeing with the PSAD and Gleason grade. , - 2021 Nocturia - 05/20/2022, - 11/15/2020, Nocturia, - 2017 Urinary Frequency, urine for cx = consider another round of tmp - 05/23/2021, - 04/26/2020, - 2018 Gross hematuria, I wonder if a chronic cystitis is contributing - I gave bactrim BID today and will start nightly TMP. - 02/22/2021, - 01/17/2021, sent Bun and Cr. Check CT scan and cystoscopy. Urine cx pending. , - 01/04/2021 ED due to arterial insufficiency, DIscussed again nature r/b/a to pde5i and sildenafil dosing. - 11/15/2020, Labs as above. Disc nature r/b/a to pde5i. , - 2022 Microscopic hematuria - 2020 Elevated PSA - 2018, Elevated prostate specific antigen (PSA), - 2015 Urinary Tract Inf, Unspec site, Urinary tract infection - 2016    NON-GU PMH: Localized enlarged lymph nodes (Stable), CT will recheck the nodes. Pt reports [facility] had done this but now "canceled all his appts". - 01/04/2021, as above. , - 2021 Encounter for general adult medical examination without abnormal findings, Encounter for preventive health examination - 2017 Personal history of other diseases of the circulatory system, History of hypertension - 2014 Personal history of other endocrine, nutritional and metabolic disease, History of hypercholesterolemia - 2014    FAMILY HISTORY: Father Deceased At Age53 ___ - [place] In Family Mother Deceased At Age 93 from diabetic complicati - [place] In Family No pertinent family history - Other   SOCIAL HISTORY: Marital Status: Married Preferred Language: English; Ethnicity: Not Hispanic Or Latino; Race: White Current Smoking Status: Patient does not smoke anymore.   Tobacco Use Assessment Completed: Used  Tobacco in last 30 days? Light Drinker.  Drinks 2 caffeinated drinks per day.     Notes: Former smoker, Tobacco use, Alcohol Use, Marital History - Currently Married, Caffeine Use   REVIEW OF SYSTEMS:    GU Review Male:   Patient denies frequent urination, hard to postpone urination, burning/ pain with urination, get up at night to urinate, leakage of urine, stream starts and stops, trouble starting your stream, have to strain to urinate , erection problems, and penile pain.  Gastrointestinal (Upper):   Patient denies nausea, vomiting, and indigestion/ heartburn.  Gastrointestinal (Lower):   Patient denies diarrhea and constipation.  Constitutional:   Patient denies fever, night sweats, weight loss, and fatigue.  Skin:   Patient denies skin rash/ lesion and itching.  Eyes:   Patient denies blurred vision and double vision.  Ears/ Nose/ Throat:   Patient denies sore throat and sinus problems.  Hematologic/Lymphatic:   Patient denies swollen glands and easy bruising.  Cardiovascular:   Patient denies leg swelling and chest pains.  Respiratory:   Patient denies cough and shortness of breath.  Endocrine:   Patient denies excessive thirst.  Musculoskeletal:   Patient denies back pain and joint pain.  Neurological:   Patient denies headaches and dizziness.  Psychologic:   Patient denies depression and anxiety.   VITAL SIGNS:      10/14/2022 11:46 AM  Weight 221 lb / 100.24 kg  Height 69 in / 175.26 cm  BP 133/75 mmHg  Heart Rate 71 /min  Temperature 98.2 F / 36.7 C  BMI 32.6 kg/m   MULTI-SYSTEM PHYSICAL EXAMINATION:    Constitutional: Well-nourished. No physical deformities.  Normally developed. Good grooming.  Neck: Neck symmetrical, not swollen. Normal tracheal position.  Respiratory: No labored breathing, no use of accessory muscles.   Cardiovascular: Normal temperature, normal extremity pulses, no swelling, no varicosities.  Skin: No paleness, no jaundice, no cyanosis. No lesion, no  ulcer, no rash.  Neurologic / Psychiatric: Oriented to time, oriented to place, oriented to person. No depression, no anxiety, no agitation.  Gastrointestinal: No mass, no tenderness, no rigidity, non obese abdomen. SP tube in place. Urine clear.     Complexity of Data:  Records Review:   Previous Doctor Records  Urine Test Review:   Urine Culture   08/13/22 12/05/21 08/16/21 05/16/21 02/15/21 11/15/20 08/23/20 04/17/20  PSA  Total PSA 1.34 ng/mL 2.29 ng/mL 2.64 ng/mL 3.17 ng/mL 3.45 ng/mL 2.13 ng/mL 1.51 ng/mL 2.60 ng/mL    02/15/21  Hormones  Testosterone, Total 479.9 ng/dL   Notes:                     ED notes    PROCEDURES: None   ASSESSMENT:      ICD-10 Details  1 GU:   BPH w/LUTS - N40.1 Chronic, Stable  2   Incomplete bladder emptying - R39.14 Chronic, Stable  3   Bladder-neck stenosis/contracture - N32.0 Undiagnosed New Problem - disc this can be a difficult problem. Urethral dilation and/or TUIBN can lead to incontinence and recurrent stricture among other risks. Some pt have scarring and keep the SP tube lifelong. A good sign, he is voiding some and hopefully we can find the opening when things settle down.   4   Prostate Cancer - C61 Chronic, Stable - completes xrt this week.    PLAN:           Schedule Return Visit/Planned Activity: Next Available Appointment - Schedule Surgery          Document Letter(s):  Created for Chart: School Excuse   Created for Patient: Clinical Summary   Created for Chart: School Excuse         Next Appointment:      Next Appointment: 11/03/2022 08:45 AM    Appointment Type: Laboratory Appointment    Location: [facility] [phone]    Provider: Lab LAB    Reason for Visit: 3 mo psa      * Signed by Festus Aloe, M.D. on 10/15/22 at 4:26 PM (EST)*      The information contained in this medical record document is considered private and confidential patient information. This information can only be used  for the medical diagnosis and/or medical services that are being provided by the patient's selected caregivers. This information can only be distributed outside of the patient's care if the patient agrees and signs waivers of authorization for this information to be sent to an outside source or route.

## 2022-10-27 NOTE — Progress Notes (Signed)
Anesthesia Chart Review   Case: [id number] Date/Time: 10/28/22 1200   Procedures:      TRANSURETHRAL INCISION OF BLADDER NECK OR LASER OF BLADDER NECK     CYSTOSCOPY WITH URETHRAL DILATATION - 87 MINS   Anesthesia type: General   Pre-op diagnosis: BLADDER NECK CONTRACTURE   Location: [facility] / [facility]   Surgeons: Festus Aloe, MD       DISCUSSION:77 y.o. former smoker with h/o HTN, OSA on CPAP, COPD, PAF, CAD (CABG 2021 with Maze and LAA clipping ), CHF, mild AS, bladder neck contracture scheduled for above procedure 10/28/2022 with Dr. Festus Aloe.   Nuclear stress test at [facility] (2/23): no ischemia, EF 54%.   Pt last seen by cardiology 04/29/2022. Stable at this visit.   S/p gold seed implant at [facility] 08/26/2022 with no anesthesia complications noted.   S/p insertion of suprapubic catheter 10/10/22 with no anesthesia complications noted.  VS: There were no vitals taken for this visit.  PROVIDERS: Darnelle Catalan, PA is PCP   Cardiologist - Glori Bickers, MD  LABS: Labs reviewed: Acceptable for surgery. (all labs ordered are listed, but only abnormal results are displayed)  Labs Reviewed - No data to display   IMAGES:   EKG:   CV: Echo 05/27/2020 1. Septal hypokinesis and dyskinesis. Basal to mid inferior hypokinesis..  Left ventricular ejection fraction, by estimation, is 40 to 45%. The left  ventricle has mildly decreased function. The left ventricle demonstrates  regional wall motion  abnormalities (see scoring diagram/findings for description). There is  moderate concentric left ventricular hypertrophy. Left ventricular  diastolic parameters are consistent with Grade II diastolic dysfunction  (pseudonormalization).   2. Trivial mitral valve regurgitation.   3. The aortic valve is calcified. There is moderate calcification of the  aortic valve. There is moderate thickening of the aortic valve. Aortic  valve regurgitation is mild. Mild aortic  valve stenosis.   4. Aortic dilatation noted. There is mild dilatation of the ascending  aorta, measuring 40 mm.   5. There is normal pulmonary artery systolic pressure.  Past Medical History:  Diagnosis Date   Aortic stenosis, mild    last echo in epic 05-27-2020  ef 40-45%,  mean grandiant 46mHg, valve area 2.46 cm^2 ,  mild regurg   BPH with obstruction/lower urinary tract symptoms    urologist--- dr eJunious Silk  Carotid bruit present 06/15/2018   2015 Carotid Dopplers:  Essentially normal carotid arteries, with very slight hard plaque in the proximal ICA's and serpentine distal vessels. 1-39% bilateral ICA stenosis. Patent vertebral arteries with antegrade flow. Normal subclavian arteries, bilaterally.   Chronic systolic CHF (congestive heart failure) ([facility] 05/2020   followed by dr bAve Filter  COPD (chronic obstructive pulmonary disease) ([facility]    Coronary artery disease 04/2020   cardiologist--- dr bHaroldine Laws   NSTEMI /  Afib w/ RVR/  Acute CHF/  cardiogenic shock 04-20-2020;  cath 05-17-2020 multivessel disease w/ critical LM;   05-18-2020 s/p CABG x4 and Maze procedure;  [facility], ef 54%   Essential hypertension, benign 12/21/2008   Qualifier: Diagnosis of  By: HPercival Spanish MD, FFarrel Gordon    Glaucoma, both eyes    History of migraine    History of non-ST elevation myocardial infarction (NSTEMI) 05/17/2020   cardiogenic shock,  AF w/ RVR, acute CHF,  ICM;   s/p CABG w/ MAZE   History of squamous cell carcinoma of skin  History of TIA (transient ischemic attack) 02/28/2019   questionable TIA  per neurology note dr Leonie Man, 04-12-2019, likely migraine   Hx of colonic polyps 01/09/2020   Hyperlipidemia    Ischemic cardiomyopathy 04/2020   per cath 09-03-20212  ef 25-30%;   last echo in epic 05-27-2020  ef 40-45%   Malignant neoplasm prostate ([place]) 10/2019   urologist--- dr Junious Silk;  dx 03/ 2021  active survillance   OA (osteoarthritis)     OSA on CPAP    Diagnosed at the [facility] 2016    PAF (paroxysmal atrial fibrillation) ([place]) 04/2020   followed by cardiology---  s/p MAZE procedure 05-18-2020, takes ASA   PVC's (premature ventricular contractions)    S/P CABG x 4 05/18/2020   LIMA to LAD,  RIMA to PDA, SVG to OM1 and OM2   Seasonal allergic rhinitis    Sinus bradycardia    event monitor 01-29-2017  mild bradycardia, no pauses, rare PVCs   Vitamin B 12 deficiency    Vitamin D deficiency 11/26/2010   Wears hearing aid in both ears     Past Surgical History:  Procedure Laterality Date   CLIPPING OF ATRIAL APPENDAGE Left 05/18/2020   Procedure: CLIPPING OF ATRIAL APPENDAGE USING ATRICURE 28 MM ATRICLIP;  Surgeon: Wonda Olds, MD;  Location: [place];  Service: Open Heart Surgery;  Laterality: Left;   COLONOSCOPY     per pt last one approx 2019  by dr Collene Mares   CORONARY ARTERY BYPASS GRAFT N/A 05/18/2020   Procedure: CORONARY ARTERY BYPASS GRAFTING (CABG) TIMES FOUR USING INTERNAL BILATERAL MAMMARY ARTERIES AND HARVESTED LEFT RADIAL ARTERY.;  Surgeon: Wonda Olds, MD;  Location: [place];  Service: Open Heart Surgery;  Laterality: N/A;  CORONARY ENDARTERECTOMY PERFORMED DURING SURGERY    GOLD SEED IMPLANT N/A 08/26/2022   Procedure: GOLD SEED IMPLANT;  Surgeon: Ceasar Mons, MD;  Location: [facility];  Service: Urology;  Laterality: N/A;   IABP INSERTION N/A 05/17/2020   Procedure: IABP INSERTION;  Surgeon: Jolaine Artist, MD;  Location: [place] CV LAB;  Service: Cardiovascular;  Laterality: N/A;   INSERTION OF SUPRAPUBIC CATHETER N/A 10/10/2022   Procedure: INSERTION OF SUPRAPUBIC CATHETER cystoscopy urethral dilation;  Surgeon: Vira Agar, MD;  [facility];  Service: Urology;  Laterality: N/A;   KNEE ARTHROPLASTY Right 03/14/2020   Procedure: COMPUTER ASSISTED TOTAL KNEE ARTHROPLASTY;  Surgeon: Rod Can, MD;  [facility];  Service: Orthopedics;   Laterality: Right;   LAPAROSCOPIC CHOLECYSTECTOMY  02/26/2000   '@[facility]'$   by dr Margot Chimes   MAZE N/A 05/18/2020   Procedure: MAZE USING ATRICURE ISOLATOR CLAMP OLL2;  Surgeon: Wonda Olds, MD;  Location: [place];  Service: Open Heart Surgery;  Laterality: N/A;   RADIAL ARTERY HARVEST Left 05/18/2020   Procedure: RADIAL ARTERY HARVEST;  Surgeon: Wonda Olds, MD;  Location: [place];  Service: Open Heart Surgery;  Laterality: Left;   RIGHT HEART CATH N/A 05/17/2020   Procedure: RIGHT HEART CATH;  Surgeon: Jolaine Artist, MD;  Location: [place] CV LAB;  Service: Cardiovascular;  Laterality: N/A;   RIGHT/LEFT HEART CATH AND CORONARY ANGIOGRAPHY N/A 05/17/2020   Procedure: RIGHT/LEFT HEART CATH AND CORONARY ANGIOGRAPHY;  Surgeon: Lorretta Harp, MD;  Location: [place] [facility];  Service: Cardiovascular;  Laterality: N/A;   SPACE OAR INSTILLATION N/A 08/26/2022   Procedure: SPACE OAR INSTILLATION;  Surgeon: Ceasar Mons, MD;  Location: [facility];  Service: Urology;  Laterality: N/A;  30 MINS FOR CASE   TEE WITHOUT CARDIOVERSION N/A 05/18/2020   Procedure: TRANSESOPHAGEAL ECHOCARDIOGRAM (TEE);  Surgeon: Wonda Olds, MD;  Location: [place];  Service: Open Heart Surgery;  Laterality: N/A;   THULIUM LASER TURP (TRANSURETHRAL RESECTION OF PROSTATE) N/A 04/15/2022   Procedure: Marcelino Duster LASER VAPORIZATION OF PROSTATE;  Surgeon: Festus Aloe, MD;  Location: [facility];  Service: Urology;  Laterality: N/A;  90 MINS FOR CASE   TONSILLECTOMY AND ADENOIDECTOMY  1952   TRANSURETHRAL RESECTION OF PROSTATE N/A 04/15/2022    MEDICATIONS:  albuterol (VENTOLIN HFA) 108 (90 Base) MCG/ACT inhaler   alfuzosin (UROXATRAL) 10 MG 24 hr tablet   aspirin 81 MG EC tablet   celecoxib (CELEBREX) 200 MG capsule   cephALEXin (KEFLEX) 500 MG capsule   cephALEXin (KEFLEX) 500 MG capsule   cetirizine (ZYRTEC) 10 MG tablet   Cholecalciferol (VITAMIN D3) 2000 UNITS  TABS   dutasteride (AVODART) 0.5 MG capsule   fluticasone (FLONASE) 50 MCG/ACT nasal spray   ketotifen (ZADITOR) 0.025 % ophthalmic solution   latanoprost (XALATAN) 0.005 % ophthalmic solution   losartan (COZAAR) 25 MG tablet   montelukast (SINGULAIR) 10 MG tablet   NON FORMULARY   oxyCODONE (ROXICODONE) 5 MG immediate release tablet   rosuvastatin (CRESTOR) 20 MG tablet   sildenafil (VIAGRA) 100 MG tablet   No current facility-administered medications for this encounter.    Eric Felix Ward, PA-C [facility] [phone]

## 2022-10-28 ENCOUNTER — Ambulatory Visit (HOSPITAL_COMMUNITY): Payer: No Typology Code available for payment source

## 2022-10-28 ENCOUNTER — Encounter (HOSPITAL_COMMUNITY): Payer: Self-pay | Admitting: Urology

## 2022-10-28 ENCOUNTER — Ambulatory Visit (HOSPITAL_COMMUNITY): Payer: No Typology Code available for payment source | Admitting: Physician Assistant

## 2022-10-28 ENCOUNTER — Encounter (HOSPITAL_COMMUNITY)
Admission: RE | Disposition: A | Discharge status: Home / Self Care | Payer: Self-pay | Source: Home / Self Care | Attending: Urology

## 2022-10-28 ENCOUNTER — Ambulatory Visit (HOSPITAL_BASED_OUTPATIENT_CLINIC_OR_DEPARTMENT_OTHER): Payer: No Typology Code available for payment source | Admitting: Anesthesiology

## 2022-10-28 ENCOUNTER — Ambulatory Visit (HOSPITAL_COMMUNITY)
Admission: RE | Admit: 2022-10-28 | Discharge: 2022-10-28 | Disposition: A | Discharge status: Home / Self Care | Payer: No Typology Code available for payment source | Attending: Urology | Admitting: Urology

## 2022-10-28 DIAGNOSIS — Z87891 Personal history of nicotine dependence: Secondary | ICD-10-CM | POA: Insufficient documentation

## 2022-10-28 DIAGNOSIS — I252 Old myocardial infarction: Secondary | ICD-10-CM

## 2022-10-28 DIAGNOSIS — I509 Heart failure, unspecified: Secondary | ICD-10-CM

## 2022-10-28 DIAGNOSIS — N401 Enlarged prostate with lower urinary tract symptoms: Secondary | ICD-10-CM | POA: Diagnosis present

## 2022-10-28 DIAGNOSIS — I2510 Atherosclerotic heart disease of native coronary artery without angina pectoris: Secondary | ICD-10-CM

## 2022-10-28 DIAGNOSIS — G4730 Sleep apnea, unspecified: Secondary | ICD-10-CM | POA: Diagnosis not present

## 2022-10-28 DIAGNOSIS — Z951 Presence of aortocoronary bypass graft: Secondary | ICD-10-CM | POA: Diagnosis not present

## 2022-10-28 DIAGNOSIS — R3914 Feeling of incomplete bladder emptying: Secondary | ICD-10-CM | POA: Diagnosis not present

## 2022-10-28 DIAGNOSIS — I110 Hypertensive heart disease with heart failure: Secondary | ICD-10-CM | POA: Insufficient documentation

## 2022-10-28 DIAGNOSIS — N320 Bladder-neck obstruction: Secondary | ICD-10-CM

## 2022-10-28 DIAGNOSIS — J449 Chronic obstructive pulmonary disease, unspecified: Secondary | ICD-10-CM | POA: Diagnosis not present

## 2022-10-28 DIAGNOSIS — C61 Malignant neoplasm of prostate: Secondary | ICD-10-CM | POA: Insufficient documentation

## 2022-10-28 DIAGNOSIS — R3912 Poor urinary stream: Secondary | ICD-10-CM | POA: Diagnosis not present

## 2022-10-28 HISTORY — PX: TRANSURETHRAL RESECTION OF PROSTATE: SHX73

## 2022-10-28 HISTORY — PX: CYSTOSCOPY WITH URETHRAL DILATATION: SHX5125

## 2022-10-28 SURGERY — CYSTOSCOPY, WITH URETHRAL DILATION
Anesthesia: General

## 2022-10-28 MED ORDER — ONDANSETRON HCL 4 MG/2ML IJ SOLN
INTRAMUSCULAR | Status: AC
  Filled 2022-10-28: qty 2

## 2022-10-28 MED ORDER — FENTANYL CITRATE (PF) 100 MCG/2ML IJ SOLN
INTRAMUSCULAR | Status: AC
  Filled 2022-10-28: qty 2

## 2022-10-28 MED ORDER — DEXMEDETOMIDINE HCL IN NACL 80 MCG/20ML IV SOLN
INTRAVENOUS | Status: DC | PRN
  Administered 2022-10-28: 12 ug via BUCCAL

## 2022-10-28 MED ORDER — STERILE WATER FOR IRRIGATION IR SOLN
Status: DC | PRN
  Administered 2022-10-28: 1000 mL

## 2022-10-28 MED ORDER — LIDOCAINE 2% (20 MG/ML) 5 ML SYRINGE
INTRAMUSCULAR | Status: DC | PRN
  Administered 2022-10-28: 60 mg via INTRAVENOUS

## 2022-10-28 MED ORDER — LACTATED RINGERS IV SOLN: INTRAVENOUS | Status: DC

## 2022-10-28 MED ORDER — LIDOCAINE HCL (PF) 2 % IJ SOLN
INTRAMUSCULAR | Status: AC
  Filled 2022-10-28: qty 5

## 2022-10-28 MED ORDER — ONDANSETRON HCL 4 MG/2ML IJ SOLN: 4.0000 mg | Freq: Four times a day (QID) | INTRAMUSCULAR | Status: DC | PRN

## 2022-10-28 MED ORDER — FENTANYL CITRATE (PF) 100 MCG/2ML IJ SOLN
INTRAMUSCULAR | Status: DC | PRN
  Administered 2022-10-28 (×3): 50 ug via INTRAVENOUS

## 2022-10-28 MED ORDER — EPHEDRINE 5 MG/ML INJ
INTRAVENOUS | Status: AC
  Filled 2022-10-28: qty 10

## 2022-10-28 MED ORDER — DEXAMETHASONE SODIUM PHOSPHATE 10 MG/ML IJ SOLN
INTRAMUSCULAR | Status: AC
  Filled 2022-10-28: qty 1

## 2022-10-28 MED ORDER — OXYCODONE HCL 5 MG PO TABS
5.0000 mg | ORAL_TABLET | Freq: Once | ORAL | Status: AC | PRN
  Administered 2022-10-28: 5 mg via ORAL

## 2022-10-28 MED ORDER — EPHEDRINE SULFATE-NACL 50-0.9 MG/10ML-% IV SOSY
PREFILLED_SYRINGE | INTRAVENOUS | Status: DC | PRN
  Administered 2022-10-28 (×2): 10 mg via INTRAVENOUS

## 2022-10-28 MED ORDER — ONDANSETRON HCL 4 MG/2ML IJ SOLN
INTRAMUSCULAR | Status: DC | PRN
  Administered 2022-10-28: 4 mg via INTRAVENOUS

## 2022-10-28 MED ORDER — PROPOFOL 10 MG/ML IV BOLUS
INTRAVENOUS | Status: AC
  Filled 2022-10-28: qty 20

## 2022-10-28 MED ORDER — PROPOFOL 500 MG/50ML IV EMUL
INTRAVENOUS | Status: DC | PRN
  Administered 2022-10-28: 180 mg via INTRAVENOUS

## 2022-10-28 MED ORDER — IOHEXOL 300 MG/ML  SOLN
INTRAMUSCULAR | Status: DC | PRN
  Administered 2022-10-28: 50 mL

## 2022-10-28 MED ORDER — CHLORHEXIDINE GLUCONATE 0.12 % MT SOLN
15.0000 mL | Freq: Once | OROMUCOSAL | Status: AC
  Administered 2022-10-28: 15 mL via OROMUCOSAL

## 2022-10-28 MED ORDER — FENTANYL CITRATE PF 50 MCG/ML IJ SOSY: 25.0000 ug | PREFILLED_SYRINGE | INTRAMUSCULAR | Status: DC | PRN

## 2022-10-28 MED ORDER — DEXAMETHASONE SODIUM PHOSPHATE 10 MG/ML IJ SOLN
INTRAMUSCULAR | Status: DC | PRN
  Administered 2022-10-28: 5 mg via INTRAVENOUS

## 2022-10-28 MED ORDER — SODIUM CHLORIDE 0.9 % IR SOLN
Status: DC | PRN
  Administered 2022-10-28: 15000 mL via INTRAVESICAL

## 2022-10-28 MED ORDER — OXYCODONE HCL 5 MG PO TABS
ORAL_TABLET | ORAL | Status: AC
  Filled 2022-10-28: qty 1

## 2022-10-28 MED ORDER — CEFAZOLIN SODIUM-DEXTROSE 2-4 GM/100ML-% IV SOLN
2.0000 g | INTRAVENOUS | Status: AC
  Administered 2022-10-28: 2 g via INTRAVENOUS
  Filled 2022-10-28: qty 100

## 2022-10-28 MED ORDER — OXYCODONE HCL 5 MG/5ML PO SOLN: 5.0000 mg | Freq: Once | ORAL | Status: AC | PRN

## 2022-10-28 MED ORDER — ORAL CARE MOUTH RINSE: 15.0000 mL | Freq: Once | OROMUCOSAL | Status: AC

## 2022-10-28 SURGICAL SUPPLY — 32 items
BAG DRN RND TRDRP ANRFLXCHMBR (UROLOGICAL SUPPLIES) ×2
BAG URINE DRAIN 2000ML AR STRL (UROLOGICAL SUPPLIES) ×2 IMPLANT
BAG URO CATCHER STRL LF (MISCELLANEOUS) ×2 IMPLANT
BALLN NEPHROSTOMY (BALLOONS)
BALLOON NEPHROSTOMY (BALLOONS) IMPLANT
CATH FOLEY 2W COUNCIL 5CC 16FR (CATHETERS) IMPLANT
CATH FOLEY 2WAY SLVR 30CC 20FR (CATHETERS) IMPLANT
CATH URETL OPEN END 6FR 70 (CATHETERS) IMPLANT
CLOTH BEACON ORANGE TIMEOUT ST (SAFETY) ×3 IMPLANT
CNTNR URN SCR LID CUP LEK RST (MISCELLANEOUS) IMPLANT
CONT SPEC 4OZ STRL OR WHT (MISCELLANEOUS) ×2
DRAPE FOOT SWITCH (DRAPES) ×2 IMPLANT
EVACUATOR MICROVAS BLADDER (UROLOGICAL SUPPLIES) ×3 IMPLANT
GLOVE BIO SURGEON STRL SZ7.5 (GLOVE) ×2 IMPLANT
GLOVE SURG LX STRL 7.5 STRW (GLOVE) ×3 IMPLANT
GOWN STRL REUS W/ TWL XL LVL3 (GOWN DISPOSABLE) ×3 IMPLANT
GOWN STRL REUS W/TWL XL LVL3 (GOWN DISPOSABLE) ×2
GUIDEWIRE ANG ZIPWIRE 038X150 (WIRE) IMPLANT
GUIDEWIRE STR DUAL SENSOR (WIRE) ×2 IMPLANT
HOLDER FOLEY CATH W/STRAP (MISCELLANEOUS) IMPLANT
KIT TURNOVER KIT A (KITS) IMPLANT
LOOP CUT BIPOLAR 24F LRG (ELECTROSURGICAL) IMPLANT
MANIFOLD NEPTUNE II (INSTRUMENTS) ×2 IMPLANT
NS IRRIG 1000ML POUR BTL (IV SOLUTION) IMPLANT
PACK CYSTO (CUSTOM PROCEDURE TRAY) ×3 IMPLANT
PENCIL SMOKE EVACUATOR (MISCELLANEOUS) IMPLANT
SYR 30ML LL (SYRINGE) IMPLANT
SYR TOOMEY IRRIG 70ML (MISCELLANEOUS) ×2
SYRINGE TOOMEY IRRIG 70ML (MISCELLANEOUS) ×3 IMPLANT
TUBING CONNECTING 10 (TUBING) ×3 IMPLANT
TUBING UROLOGY SET (TUBING) ×2 IMPLANT
WATER STERILE IRR 3000ML UROMA (IV SOLUTION) ×2 IMPLANT

## 2022-10-28 NOTE — Transfer of Care (Signed)
Immediate Anesthesia Transfer of Care Note  Patient: Eric Stokes  Procedure(s) Performed: Procedure(s) with comments: CYSTOSCOPY WITH BLADDER NECK DILATATION (N/A) - 52 MINS TRANSURETHRAL RESECTION OF THE PROSTATE (TURP)  Patient Location: PACU  Anesthesia Type:General  Level of Consciousness: Alert, Awake, Oriented  Airway & Oxygen Therapy: Patient Spontanous Breathing  Post-op Assessment: Report given to RN  Post vital signs: Reviewed and stable  Last Vitals:  Vitals:   10/28/22 0948  BP: 120/80  Pulse: 73  Resp: 18  Temp: 36.6 C  SpO2: 0000000    Complications: No apparent anesthesia complications

## 2022-10-28 NOTE — Op Note (Signed)
Preoperative diagnosis: Bladder neck contracture Postoperative diagnosis: BPH, bladder neck contracture  Procedure: Cystoscopy with bladder neck dilation, transurethral resection of prostate  Surgeon: Eric Stokes  Assistant: Eric Stokes  The assistant was necessary for dual camera operation to locate key anatomy, for safe passage of wire and accurate dilation of the bladder neck.  Anesthesia: General  Indication for procedure: Eric Stokes is a 77 year old male with a history of BPH.  He was on finasteride to downsize his prostate but still has significant lower urinary tract symptoms.  He underwent thulium laser vaporization of the prostate in preparation for external beam radiation for his known prostate cancer.  Over the months he developed a weak stream and went into retention.  A catheter could not be placed.  He was scoped in the office with a blind-ending urethra near the bladder neck.  Eric Stokes placed a suprapubic tube.  He was brought today for above procedure.  Findings: On cystoscopy the verumontanum was visible, the prostatic urethra was visible about halfway up and then it appeared the lateral lobes and the bladder neck had all fused.  Cystoscopy via the suprapubic tract revealed a trabeculated bladder and a contracted bladder neck. Once retrograde access to the bladder was obtained, it appeared the bladder neck and the lateral lobes had fused the shutting off the prostatic urethra.  Otherwise the trigone and ureteral orifice ease were located normal pre and post resection without injury.  He had a large capacity trabeculated bladder.  The suprapubic tube was confirmed to be intravesical after exchange.  On retrograde urethrogram-a risk of contrast did make it through a narrowed prostatic urethra into the bladder.  However we were not able to find this track retrograde with the wire.  On cystogram this outlined a trabeculated bladder with narrowing at the bladder neck.  Description of procedure:  After consent was obtained patient brought to the operating room.  After adequate anesthesia he is placed in lithotomy position and prepped and draped in the usual sterile fashion.  Timeout was performed from the patient and procedure.  The rigid cystoscope was passed per urethra where the verumontanum was visualized and then up above the prostatic urethra was patent but then shot off.  I tried to pass a sensor wire to no avail.  I then shot a retrograde urethrogram through the cystoscope.  I then prepped the suprapubic tube and suprapubic tube area in the field.  The assistant came in the room and Dr. [name] drove the cystoscope.  I then passed a sensor wire through the suprapubic tube and coiled this in the bladder under fluoroscopic guidance.  The suprapubic tube suture was cut and the suprapubic tube balloon deflated and it was backed out without difficulty.  I then passed the flexible cystoscope into the bladder without difficulty.  With a scope in the bladder and a scope in the urethra we were able to find the bladder neck and see each other's light.  I then passed a sensor wire antegrade through the bladder neck and into the urethra where it was visualized cystoscopically in the urethra.  Dr. [name] was then able to then follow the wire into the bladder dilating the prostatic tissue and the bladder neck.  We then passed a sensor wire retrograde through the urethral cystoscope and into the bladder.  The flexible scope and extra wire then backed out from the suprapubic tube tract.  I then passed a new 16 [place] council tip catheter over the wire into the bladder.  The  balloon was inflated to 10 cc and pulled back to the bladder wall leaving this as the new suprapubic tube.  It was then clamped.    I then backed out the cystoscope and swapped that out for the continuous-flow sheath passed with the visual obturator and then the loop and handle.  It became apparent that the bladder neck but also the lateral  lobes had fused.  There was still some intravesical prostate on the right.  I therefore located the ureteral orifice ease and marked those and then carefully started as is typical at 7:00 on the bladder neck and found the bladder neck and then resected some prostate tissue out laterally to expose the bladder neck.  I then started at 5:00 on the left side and resected out laterally to relocate the bladder neck and open it up.  I then carefully brought these incisions down toward the verumontanum resecting the lateral lobe tissue on each side to create a good channel.  I did not resect the posterior strip.  I did just enough I felt to reopen the channel.  I did leave some apical tissue at the verumontanum.  All the chips were evacuated.  Hemostasis was excellent at low pressure.  I then backed out the resectoscope and over the sensor wire a 20 French Foley catheter was advanced.  It went without difficulty and the wire was removed.  30 cc was placed in the balloon and it was seated at the bladder neck under fluoroscopic and cystogram guidance.  Irrigation was clear but I started CBI for wake-up through the Foley and draining out the suprapubic tube.  Will plan to The suprapubic tube and drain the Foley from the PACU to home.  He was awakened taken the cover room in stable condition.  Complications: None  Blood loss: 50 mL  Drains: 16 French suprapubic tube, 20 French Foley catheter per urethra  Specimens to pathology: Prostate chips  Disposition: Patient stable to PACU

## 2022-10-28 NOTE — Interval H&P Note (Signed)
History and Physical Interval Note:  10/28/2022 11:55 AM  Eric Stokes  has presented today for surgery, with the diagnosis of Eric Stokes.  The various methods of treatment have been discussed with the patient and family. After consideration of risks, benefits and other options for treatment, the patient has consented to  Procedure(s) with comments: [place] (N/A) CYSTOSCOPY WITH URETHRAL DILATATION (N/A) - 75 MINS as a surgical intervention.  Seen in the preop area with his wife.  He had some cloudy urine and penile pain.  He started the cephalexin back twice a day and all that has cleared up.  No fever.  He is getting some urine per urethra.  We discussed the nature risks and benefits of the above procedure, we talked about possibly needing to dilate with dilators, balloons possibly incise with lasers or with a hot loop.  We discussed risk such as bleeding, infection, scar tissue recurrence as well as incontinence.  We discussed again incontinence can be severe.  It can require pads and diapers.  Also, we discussed again, some patients allow the scar tissue to recur and keep the suprapubic tube.  Also we discussed CIC.  He will be considering all of these options.  The patient's history has been reviewed, patient examined, no change in status, stable for surgery.  I have reviewed the patient's chart and labs.  Questions were answered to the patient's satisfaction.  Will plan to change the suprapubic tube.   Eric Stokes

## 2022-10-28 NOTE — Anesthesia Procedure Notes (Signed)
Procedure Name: LMA Insertion Date/Time: 10/28/2022 12:23 PM  Performed by: Gerald Leitz, CRNAPre-anesthesia Checklist: Patient identified, Patient being monitored, Timeout performed, Emergency Drugs available and Suction available Patient Re-evaluated:Patient Re-evaluated prior to induction Oxygen Delivery Method: Circle system utilized Preoxygenation: Pre-oxygenation with 100% oxygen Induction Type: IV induction Ventilation: Mask ventilation without difficulty LMA: LMA inserted LMA Size: 5.0 Tube type: Oral Number of attempts: 1 Placement Confirmation: positive ETCO2 and breath sounds checked- equal and bilateral Tube secured with: Tape Dental Injury: Teeth and Oropharynx as per pre-operative assessment

## 2022-10-28 NOTE — Anesthesia Preprocedure Evaluation (Signed)
Anesthesia Evaluation  Patient identified by MRN, date of birth, ID band Patient awake    Reviewed: Allergy & Precautions, H&P , NPO status , Patient's Chart, lab work & pertinent test results  Airway Mallampati: II   Neck ROM: full    Dental   Pulmonary asthma , sleep apnea , COPD, former smoker   breath sounds clear to auscultation       Cardiovascular hypertension, + CAD, + Past MI, + CABG and +CHF   Rhythm:regular Rate:Normal  TTE (05/2020): EF 40-45%   Neuro/Psych TIA   GI/Hepatic   Endo/Other    Renal/GU      Musculoskeletal  (+) Arthritis ,    Abdominal   Peds  Hematology   Anesthesia Other Findings   Reproductive/Obstetrics                             Anesthesia Physical Anesthesia Plan  ASA: 3  Anesthesia Plan: General   Post-op Pain Management:    Induction: Intravenous  PONV Risk Score and Plan: 2 and Ondansetron, Dexamethasone and Treatment may vary due to age or medical condition  Airway Management Planned: LMA  Additional Equipment:   Intra-op Plan:   Post-operative Plan: Extubation in OR  Informed Consent: I have reviewed the patients History and Physical, chart, labs and discussed the procedure including the risks, benefits and alternatives for the proposed anesthesia with the patient or authorized representative who has indicated his/her understanding and acceptance.     Dental advisory given  Plan Discussed with: CRNA, Anesthesiologist and Surgeon  Anesthesia Plan Comments:        Anesthesia Quick Evaluation

## 2022-10-29 ENCOUNTER — Encounter (HOSPITAL_COMMUNITY): Payer: Self-pay | Admitting: Urology

## 2022-10-29 NOTE — Anesthesia Postprocedure Evaluation (Signed)
Anesthesia Post Note  Patient: Eric Stokes  Procedure(s) Performed: CYSTOSCOPY WITH BLADDER NECK DILATATION TRANSURETHRAL RESECTION OF THE PROSTATE (TURP)     Patient location during evaluation: PACU Anesthesia Type: General Level of consciousness: sedated and patient cooperative Pain management: pain level controlled Vital Signs Assessment: post-procedure vital signs reviewed and stable Respiratory status: spontaneous breathing Cardiovascular status: stable Anesthetic complications: no   No notable events documented.  Last Vitals:  Vitals:   10/28/22 1515 10/28/22 1516  BP: (!) 142/83 (!) 142/83  Pulse: 71 71  Resp:  15  Temp:  (!) 36.4 C  SpO2: 100% 100%    Last Pain:  Vitals:   10/28/22 1516  TempSrc: Axillary  PainSc: [place]

## 2022-10-30 LAB — SURGICAL PATHOLOGY

## 2022-11-03 ENCOUNTER — Encounter: Payer: Self-pay | Admitting: Radiation Oncology

## 2022-11-07 NOTE — Progress Notes (Signed)
                                                                                                                                                             Patient Name: Eric Stokes MRN: [id number] DOB: 02/19/1946 Referring Physician: Festus Aloe (Profile Not Attached) Date of Service: 10/17/2022 [facility], [place]                                                        End Of Treatment Note  Diagnoses: C61-Malignant neoplasm of prostate  Cancer Staging: 77 y.o. gentleman with Stage T1c adenocarcinoma of the prostate with Gleason score of 3+4, and PSA of 4.6.   Intent: Curative  Radiation Treatment Dates: 09/08/2022 through 10/17/2022 Site Technique Total Dose (Gy) Dose per Fx (Gy) Completed Fx Beam Energies  Prostate: Prostate IMRT 70/70 2.5 28/28 6X   Narrative: The patient tolerated radiation therapy relatively well. He experienced nocturia, dysuria, diarrhea and modest fatigue.  Plan: The patient will receive a call in about one month from the radiation oncology department. He will continue follow up with his urologist, Dr. Junious Silk as well.  ------------------------------------------------   Tyler Pita, MD Regino Ramirez: [phone]  Fax: [phone] [email or web address]  Skype  [email or web address]

## 2022-11-10 ENCOUNTER — Other Ambulatory Visit (HOSPITAL_BASED_OUTPATIENT_CLINIC_OR_DEPARTMENT_OTHER): Payer: Self-pay

## 2022-11-12 ENCOUNTER — Other Ambulatory Visit (HOSPITAL_BASED_OUTPATIENT_CLINIC_OR_DEPARTMENT_OTHER): Payer: Self-pay

## 2022-11-12 MED ORDER — CEPHALEXIN 500 MG PO CAPS
500.0000 mg | ORAL_CAPSULE | Freq: Every day | ORAL | 0 refills | Status: DC
  Filled 2022-11-12: qty 10, 10d supply, fill #0

## 2022-11-13 ENCOUNTER — Other Ambulatory Visit (HOSPITAL_BASED_OUTPATIENT_CLINIC_OR_DEPARTMENT_OTHER): Payer: Self-pay

## 2022-11-18 ENCOUNTER — Ambulatory Visit
Admission: RE | Admit: 2022-11-18 | Discharge: 2022-11-18 | Disposition: A | Discharge status: Home / Self Care | Payer: Medicare Other | Source: Ambulatory Visit | Attending: Radiation Oncology | Admitting: Radiation Oncology

## 2022-11-18 NOTE — Progress Notes (Signed)
  Radiation Oncology         [phone] ________________________________  Name: Eric Stokes MRN: [id number]  Date of Service: 11/18/2022  DOB: [birth year]  Post Treatment Telephone Note  Diagnosis:  77 y.o. gentleman with Stage T1c adenocarcinoma of the prostate with Gleason score of 3+4, and PSA of 4.6.   Intent: Curative  Radiation Treatment Dates: 09/08/2022 through 10/17/2022 Site Technique Total Dose (Gy) Dose per Fx (Gy) Completed Fx Beam Energies  Prostate: Prostate IMRT 70/70 2.5 28/28 6X   (as documented in provider EOT note)   Pre Treatment IPSS Score: 22 (as documented in the provider consult note)   The patient was available for call today.   Symptoms of fatigue have improved since completing therapy.  Symptoms of bladder changes have improved since completing therapy. Current symptoms include nocturia x3, and medications for bladder symptoms include Alfuzosin.  Symptoms of bowel changes have improved since completing therapy. Current symptoms include none, and medications for bowel symptoms include none.     Post Treatment IPSS Score: IPSS Questionnaire (AUA-7): Over the past month.   1)  How often have you had a sensation of not emptying your bladder completely after you finish urinating?  1 - Less than 1 time in 5  2)  How often have you had to urinate again less than two hours after you finished urinating? 2 - Less than half the time  3)  How often have you found you stopped and started again several times when you urinated?  2 - Less than half the time  4) How difficult have you found it to postpone urination?  1 - Less than 1 time in 5  5) How often have you had a weak urinary stream?  3 - About half the time  6) How often have you had to push or strain to begin urination?  1 - Less than 1 time in 5  7) How many times did you most typically get up to urinate from the time you went to bed until the time you got up in the morning?  3 - 3 times  Total  score:  13. Which indicates moderate symptoms  0-7 mildly symptomatic   8-19 moderately symptomatic   20-35 severely symptomatic    Patient has a scheduled follow up visit with his urologist, Dr. Junious Silk, on 11/20/2022 for ongoing surveillance. He was counseled that PSA levels will be drawn in the urology office, and was reassured that additional time is expected to improve bowel and bladder symptoms. He was encouraged to call back with concerns or questions regarding radiation.  This concludes the interview.   Leandra Kern, LPN

## 2022-11-19 ENCOUNTER — Other Ambulatory Visit (HOSPITAL_BASED_OUTPATIENT_CLINIC_OR_DEPARTMENT_OTHER): Payer: Self-pay

## 2022-11-19 MED ORDER — DOXYCYCLINE HYCLATE 100 MG PO CAPS
100.0000 mg | ORAL_CAPSULE | Freq: Two times a day (BID) | ORAL | 0 refills | Status: DC
  Filled 2022-11-19: qty 14, 7d supply, fill #0

## 2022-11-24 ENCOUNTER — Other Ambulatory Visit (HOSPITAL_BASED_OUTPATIENT_CLINIC_OR_DEPARTMENT_OTHER): Payer: Self-pay

## 2022-11-24 MED ORDER — SULFAMETHOXAZOLE-TRIMETHOPRIM 800-160 MG PO TABS
1.0000 | ORAL_TABLET | Freq: Two times a day (BID) | ORAL | 0 refills | Status: DC
  Filled 2022-11-24: qty 14, 7d supply, fill #0

## 2022-11-25 ENCOUNTER — Other Ambulatory Visit (HOSPITAL_BASED_OUTPATIENT_CLINIC_OR_DEPARTMENT_OTHER): Payer: Self-pay

## 2022-11-25 ENCOUNTER — Other Ambulatory Visit: Payer: Self-pay

## 2023-03-20 ENCOUNTER — Other Ambulatory Visit (HOSPITAL_BASED_OUTPATIENT_CLINIC_OR_DEPARTMENT_OTHER): Payer: Self-pay

## 2023-03-25 ENCOUNTER — Other Ambulatory Visit (HOSPITAL_BASED_OUTPATIENT_CLINIC_OR_DEPARTMENT_OTHER): Payer: Self-pay

## 2023-03-25 MED ORDER — DOXYCYCLINE HYCLATE 100 MG PO TABS
100.0000 mg | ORAL_TABLET | Freq: Two times a day (BID) | ORAL | 0 refills | Status: DC
  Filled 2023-03-25: qty 20, 10d supply, fill #0

## 2023-05-01 ENCOUNTER — Other Ambulatory Visit (HOSPITAL_BASED_OUTPATIENT_CLINIC_OR_DEPARTMENT_OTHER): Payer: Self-pay

## 2023-05-01 MED ORDER — SULFAMETHOXAZOLE-TRIMETHOPRIM 800-160 MG PO TABS
1.0000 | ORAL_TABLET | Freq: Two times a day (BID) | ORAL | 0 refills | Status: DC
  Filled 2023-05-01: qty 70, 35d supply, fill #0

## 2023-05-06 ENCOUNTER — Other Ambulatory Visit: Payer: Self-pay | Admitting: Urology

## 2023-05-08 NOTE — Patient Instructions (Signed)
SURGICAL WAITING ROOM VISITATION Patients having surgery or a procedure may have no more than 2 support people in the waiting area - these visitors may rotate.    Children under the age of 37 must have an adult with them who is not the patient.  If the patient needs to stay at the hospital during part of their recovery, the visitor guidelines for inpatient rooms apply. Pre-op nurse will coordinate an appropriate time for 1 support person to accompany patient in pre-op.  This support person may not rotate.    Please refer to the [facility] website for the visitor guidelines for Inpatients (after your surgery is over and you are in a regular room).       Your procedure is scheduled on: 05-19-23   Report to [facility] Main Entrance    Report to admitting at 9:00 AM   Call this number if you have problems the morning of surgery [phone]   Do not eat food or drink liquids :After Midnight.            If you have questions, please contact your surgeon's office.   FOLLOW ANY ADDITIONAL PRE OP INSTRUCTIONS YOU RECEIVED FROM YOUR SURGEON'S OFFICE!!!     Oral Hygiene is also important to reduce your risk of infection.                                    Remember - BRUSH YOUR TEETH THE MORNING OF SURGERY WITH YOUR REGULAR TOOTHPASTE   Do NOT smoke after Midnight   Take these medicines the morning of surgery with A SIP OF WATER:   None  Okay to use inhalers, nasal spray, eyedrops  Stop all vitamins and herbal supplements 7 days before surgery  Bring CPAP mask and tubing day of surgery.                              You may not have any metal on your body including jewelry, and body piercing             Do not wear  lotions, powders, cologne, or deodorant              Men may shave face and neck.   Do not bring valuables to the hospital. [facility] IS NOT RESPONSIBLE   FOR VALUABLES.   Contacts, dentures or bridgework may not be worn into surgery.  DO NOT BRING YOUR  HOME MEDICATIONS TO THE HOSPITAL. PHARMACY WILL DISPENSE MEDICATIONS LISTED ON YOUR MEDICATION LIST TO YOU DURING YOUR ADMISSION IN THE HOSPITAL!    Patients discharged on the day of surgery will not be allowed to drive home.  Someone NEEDS to stay with you for the first 24 hours after anesthesia.   Please read over the following fact sheets you were given: IF YOU HAVE QUESTIONS ABOUT YOUR PRE-OP INSTRUCTIONS PLEASE CALL [phone] [place]  If you received a COVID test during your pre-op visit  it is requested that you wear a mask when out in public, stay away from anyone that may not be feeling well and notify your surgeon if you develop symptoms. If you test positive for Covid or have been in contact with anyone that has tested positive in the last 10 days please notify you surgeon.  [place] - Preparing for Surgery Before surgery, you can play an  important role.  Because skin is not sterile, your skin needs to be as free of germs as possible.  You can reduce the number of germs on your skin by washing with CHG (chlorahexidine gluconate) soap before surgery.  CHG is an antiseptic cleaner which kills germs and bonds with the skin to continue killing germs even after washing. Please DO NOT use if you have an allergy to CHG or antibacterial soaps.  If your skin becomes reddened/irritated stop using the CHG and inform your nurse when you arrive at Short Stay. Do not shave (including legs and underarms) for at least 48 hours prior to the first CHG shower.  You may shave your face/neck.  Please follow these instructions carefully:  1.  Shower with CHG Soap the night before surgery and the  morning of surgery.  2.  If you choose to wash your hair, wash your hair first as usual with your normal  shampoo.  3.  After you shampoo, rinse your hair and body thoroughly to remove the shampoo.                             4.  Use CHG as you would any other liquid soap.  You can apply chg directly to the skin  and wash.  Gently with a scrungie or clean washcloth.  5.  Apply the CHG Soap to your body ONLY FROM THE NECK DOWN.   Do   not use on face/ open                           Wound or open sores. Avoid contact with eyes, ears mouth and   genitals (private parts).                       Wash face,  Genitals (private parts) with your normal soap.             6.  Wash thoroughly, paying special attention to the area where your    surgery  will be performed.  7.  Thoroughly rinse your body with warm water from the neck down.  8.  DO NOT shower/wash with your normal soap after using and rinsing off the CHG Soap.                9.  Pat yourself dry with a clean towel.            10.  Wear clean pajamas.            11.  Place clean sheets on your bed the night of your first shower and do not  sleep with pets. Day of Surgery : Do not apply any lotions/deodorants the morning of surgery.  Please wear clean clothes to the hospital/surgery center.  FAILURE TO FOLLOW THESE INSTRUCTIONS MAY RESULT IN THE CANCELLATION OF YOUR SURGERY  PATIENT SIGNATURE_________________________________  NURSE SIGNATURE__________________________________  ________________________________________________________________________

## 2023-05-08 NOTE — Progress Notes (Signed)
COVID Vaccine Completed:  Yes  Date of COVID positive in last 90 days:  PCP - Claiborne Billings, PA Cardiologist - Arvilla Meres, MD  Chest x-ray -  EKG -  Stress Test - 01-29-17 Epic ECHO - 02-27-23 [facility] at [facility] Cardiac Cath - 05-17-20 Epic Pacemaker/ICD device last checked: Spinal Cord Stimulator: Holter Monitor - 01-29-17 Epic  Bowel Prep -   Sleep Study - Yes, +sleep apnea CPAP -   Fasting Blood Sugar -  Checks Blood Sugar _____ times a day  Last dose of GLP1 agonist-  N/A GLP1 instructions:  N/A   Last dose of SGLT-2 inhibitors-  N/A SGLT-2 instructions: N/A   Blood Thinner Instructions:  Time Aspirin Instructions: Last Dose:  Activity level:  Can go up a flight of stairs and perform activities of daily living without stopping and without symptoms of chest pain or shortness of breath.  Able to exercise without symptoms  Unable to go up a flight of stairs without symptoms of     Anesthesia review: Afib, Hx of MI, HTN, TIA, OSA, CHF, S/P CABG, mild aortic stenosis  Patient denies shortness of breath, fever, cough and chest pain at PAT appointment  Patient verbalized understanding of instructions that were given to them at the PAT appointment. Patient was also instructed that they will need to review over the PAT instructions again at home before surgery.

## 2023-05-11 ENCOUNTER — Encounter (HOSPITAL_COMMUNITY)
Admission: RE | Admit: 2023-05-11 | Discharge: 2023-05-11 | Disposition: A | Discharge status: Home / Self Care | Payer: No Typology Code available for payment source | Source: Ambulatory Visit | Attending: Urology | Admitting: Urology

## 2023-05-11 ENCOUNTER — Encounter (HOSPITAL_COMMUNITY): Payer: Self-pay

## 2023-05-11 ENCOUNTER — Other Ambulatory Visit: Payer: Self-pay

## 2023-05-11 VITALS — BP 136/67 | HR 68 | Temp 98.0°F | Resp 20 | Ht 68.5 in | Wt 229.0 lb

## 2023-05-11 DIAGNOSIS — Z01812 Encounter for preprocedural laboratory examination: Secondary | ICD-10-CM | POA: Diagnosis not present

## 2023-05-11 DIAGNOSIS — I10 Essential (primary) hypertension: Secondary | ICD-10-CM | POA: Insufficient documentation

## 2023-05-11 DIAGNOSIS — Z01818 Encounter for other preprocedural examination: Secondary | ICD-10-CM | POA: Diagnosis present

## 2023-05-11 DIAGNOSIS — Z01810 Encounter for preprocedural cardiovascular examination: Secondary | ICD-10-CM | POA: Diagnosis not present

## 2023-05-11 LAB — CBC
HCT: 38.3 % — ABNORMAL LOW (ref 39.0–52.0)
Hemoglobin: 12.8 g/dL — ABNORMAL LOW (ref 13.0–17.0)
MCH: 31.1 pg (ref 26.0–34.0)
MCHC: 33.4 g/dL (ref 30.0–36.0)
MCV: 93 fL (ref 80.0–100.0)
Platelets: 162 10*3/uL (ref 150–400)
RBC: 4.12 MIL/uL — ABNORMAL LOW (ref 4.22–5.81)
RDW: 13.9 % (ref 11.5–15.5)
WBC: 4.1 10*3/uL (ref 4.0–10.5)
nRBC: 0 % (ref 0.0–0.2)

## 2023-05-11 LAB — BASIC METABOLIC PANEL
Anion gap: 11 (ref 5–15)
BUN: 19 mg/dL (ref 8–23)
CO2: 23 mmol/L (ref 22–32)
Calcium: 9.3 mg/dL (ref 8.9–10.3)
Chloride: 106 mmol/L (ref 98–111)
Creatinine, Ser: 0.94 mg/dL (ref 0.61–1.24)
GFR, Estimated: >60 mL/min (ref >60)
Glucose, Bld: 124 mg/dL — ABNORMAL HIGH (ref 70–99)
Potassium: 3.9 mmol/L (ref 3.5–5.1)
Sodium: 140 mmol/L (ref 135–145)

## 2023-05-13 ENCOUNTER — Telehealth (HOSPITAL_COMMUNITY): Payer: Self-pay | Admitting: Cardiology

## 2023-05-13 NOTE — Telephone Encounter (Signed)
Eric Stokes with [facility] called with a cardiac clearance request per anesthesia.  Currently scheduled on 10/1 Transurethral prostate resection     Daughter also called with same request, please call daughter once complete

## 2023-05-13 NOTE — Progress Notes (Signed)
DISCUSSION: Eric Stokes is a 77 yo male who presents to PAT prior to TURP. PMH of former smoking, HTN, CAD s/p CABG (05/2020), hx of NSTEMI (2021), A.fib s/p MAZE procedure (2021), systolic CHF, moderate AS, carotid artery disease, COPD, OSA (uses CPAP), hx of TIA?, BPH, prostate cancer, s/p SPT placement  Patient has underwent multiple urologic procedures on 08/26/2022, 10/10/2022, 10/28/2022. No anesthesia complications noted  Patient has extensive cardiac hx. Had an NSTEMI in 05/2020 and underwent CABG and MAZE procedure. Last seen in clinic on 04/29/2022 and noted to be stable. In the interim he saw Pulmonology at the [facility] for SOB. Inhalers were tried without relief. Echo was obtained which showed worsening AS and they felt worsening SOB was due to this so cardiac clearance was requested.   Patient followed up with Cardiology on 05/15/23. Reports mild SOB to provider but appeared euvolemic on exam. Has multiple medication intolerances which limits maximizing GDMT. Due to worsening AS "can consider referral to structural heart team down the road" Per [place], NP:  "Pre-Op CV risk stratification - Moderate risk due to previous cardiac history, but not prohibitive.  - Revised Cardiac Risk Index score = 2 pts (10% 30-day risk of death, MI or cardiac arrest)"  VS: BP 136/67   Pulse 68   Temp 36.7 C (Oral)   Resp 20   Ht 5' 8.5" (1.74 m)   Wt 103.9 kg   SpO2 97%   BMI 34.31 kg/m   PROVIDERS: Tessie Eke, PA   LABS: Labs reviewed: Acceptable for surgery. (all labs ordered are listed, but only abnormal results are displayed)  Labs Reviewed  BASIC METABOLIC PANEL - Abnormal; Notable for the following components:      Result Value   Glucose, Bld 124 (*)    All other components within normal limits  CBC - Abnormal; Notable for the following components:   RBC 4.12 (*)    Hemoglobin 12.8 (*)    HCT 38.3 (*)    All other components within normal limits     IMAGES:   EKG  05/11/23  Normal sinus rhythm with sinus arrhythmia Normal ECG   CV:  Echo 02/27/2023  Interpretation Summary Complete 2D Echocardiogram with Spectral and Color Doppler was performed. Indication: dyspnea Study Quality: Fair Rhythm during study: sinus.  The left ventricle is normal in size. There is moderate concentric left ventricular hypertrophy. Left ventricular systolic function is low normal. LVEF is visually estimated at 50% Quantitative LVEF is 59-62% (biplane method of disks) There is anterior-lateral hypokinesis. Grade II diastolic LV dysfunction (pseudonormal pattern) with high-normal LV filling pressures (E/e'-average=12) The right ventricle is normal in size and function. The left atrium is mildly dilated. Right atrial size is normal. Aortic valve is probably trileaflet, calcified and stenotic. Peak transaortic velocity is 3.5 m/s, corresponding to a peak instantaneous gradient of 48 and a mean gradient of 28 mm Hg. AVA is 0.9 cm2 by continuity equation. Dimensionless index is 0.28. There is no significant aortic insufficiency. Trace to mild pulmonic valvular regurgitation. There are mild mitral annulus and leaflet calcifications. There is no mitral stenosis. There is moderate (2+) mitral regurgitation. There is moderate tricuspid regurgitation. RVSP is normal at 22 mm Hg. The IVC is normal in size with an inspiratory collapse of greater then 50%, suggesting normal right atrial pressure. Ascending thoracic aorta at the upper limits of normal at 3.5 mc. There is no pericardial effusion.  Comment: compared with the echocardiographic report of 09/17/2021, there are significant  changes. Aortic stenosis severity has worsened from mild to moderate, mean gradient has increased from11 to 28 mm Hg, LVH has developed and LV filling pressures have increased to high-normal level. On the other hand, LVEF has improved from 40-45% then to 50% on the current study.Severity of  mitral, tricuspid regurgitation and RVSP have not changed.  Nuclear stress test at [facility] (2/23): no ischemia, EF 54%    Past Medical History:  Diagnosis Date   Aortic stenosis, mild    last echo in epic 05-27-2020  ef 40-45%,  mean grandiant , valve area 2.46 cm^2 ,  mild regurg   BPH with obstruction/lower urinary tract symptoms    urologist--- dr Mena Goes   Carotid bruit present 06/15/2018   2015 Carotid Dopplers:  Essentially normal carotid arteries, with very slight hard plaque in the proximal ICA's and serpentine distal vessels. 1-39% bilateral ICA stenosis. Patent vertebral arteries with antegrade flow. Normal subclavian arteries, bilaterally.   Chronic systolic CHF (congestive heart failure) (HCC) 05/2020   followed by dr Jeanelle Malling   COPD (chronic obstructive pulmonary disease) (HCC)    Coronary artery disease 04/2020   cardiologist--- dr Gala Romney;   NSTEMI /  Afib w/ RVR/  Acute CHF/  cardiogenic shock 04-20-2020;  cath 05-17-2020 multivessel disease w/ critical LM;   05-18-2020 s/p CABG x4 and Maze procedure;  nuclear stress study done [facility] 09-17-2021 no ischemia, ef 54%   Essential hypertension, benign 12/21/2008   Qualifier: Diagnosis of  By: Antoine Poche, MD, Gerrit Heck     Glaucoma, both eyes    History of migraine    History of non-ST elevation myocardial infarction (NSTEMI) 05/17/2020   cardiogenic shock,  AF w/ RVR, acute CHF,  ICM;   s/p CABG w/ MAZE   History of squamous cell carcinoma of skin    History of TIA (transient ischemic attack) 02/28/2019   questionable TIA  per neurology note dr Pearlean Brownie, 04-12-2019, likely migraine   Hx of colonic polyps 01/09/2020   Hyperlipidemia    Ischemic cardiomyopathy 04/2020   per cath 09-03-20212  ef 25-30%;   last echo in epic 05-27-2020  ef 40-45%   Malignant neoplasm prostate (HCC) 10/2019   urologist--- dr Mena Goes;  dx 03/ 2021  active survillance   OA (osteoarthritis)    OSA on CPAP    Diagnosed at the [facility]  2016    PAF (paroxysmal atrial fibrillation) (HCC) 04/2020   followed by cardiology---  s/p MAZE procedure 05-18-2020, takes ASA   PVC's (premature ventricular contractions)    S/P CABG x 4 05/18/2020   LIMA to LAD,  RIMA to PDA, SVG to OM1 and OM2   Seasonal allergic rhinitis    Sinus bradycardia    event monitor 01-29-2017  mild bradycardia, no pauses, rare PVCs   Vitamin B 12 deficiency    Vitamin D deficiency 11/26/2010   Wears hearing aid in both ears     Past Surgical History:  Procedure Laterality Date   CATARACT EXTRACTION W/ INTRAOCULAR LENS IMPLANT Bilateral    CLIPPING OF ATRIAL APPENDAGE Left 05/18/2020   Procedure: CLIPPING OF ATRIAL APPENDAGE USING ATRICURE 45 MM ATRICLIP;  Surgeon: Linden Dolin, MD;  Location: [facility] OR;  Service: Open Heart Surgery;  Laterality: Left;   COLONOSCOPY     per pt last one approx 2019  by dr Loreta Ave   CORONARY ARTERY BYPASS GRAFT N/A 05/18/2020   Procedure: CORONARY ARTERY BYPASS GRAFTING (CABG) TIMES FOUR USING INTERNAL BILATERAL MAMMARY ARTERIES AND HARVESTED  LEFT RADIAL ARTERY.;  Surgeon: Linden Dolin, MD;  [facility] OR;  Service: Open Heart Surgery;  Laterality: N/A;  CORONARY ENDARTERECTOMY PERFORMED DURING SURGERY    CYSTOSCOPY WITH URETHRAL DILATATION N/A 10/28/2022   Procedure: CYSTOSCOPY WITH BLADDER NECK DILATATION;  Surgeon: Jerilee Field, MD;  Location: [facility] ORS;  Service: Urology;  Laterality: N/A;  75 MINS   GOLD SEED IMPLANT N/A 08/26/2022   Procedure: GOLD SEED IMPLANT;  Surgeon: Rene Paci, MD;  Location: [facility];  Service: Urology;  Laterality: N/A;   IABP INSERTION N/A 05/17/2020   Procedure: IABP INSERTION;  Surgeon: Dolores Patty, MD;  Location: [facility] INVASIVE CV LAB;  Service: Cardiovascular;  Laterality: N/A;   INSERTION OF SUPRAPUBIC CATHETER N/A 10/10/2022   Procedure: INSERTION OF SUPRAPUBIC CATHETER cystoscopy urethral dilation;  Surgeon: Despina Arias, MD;  Location:  [facility] ORS;  Service: Urology;  Laterality: N/A;   KNEE ARTHROPLASTY Right 03/14/2020   Procedure: COMPUTER ASSISTED TOTAL KNEE ARTHROPLASTY;  Surgeon: Samson Frederic, MD;  Location: [facility]S;  Service: Orthopedics;  Laterality: Right;   LAPAROSCOPIC CHOLECYSTECTOMY  02/26/2000   @[facility]   by dr Jamey Ripa   MAZE N/A 05/18/2020   Procedure: MAZE USING ATRICURE ISOLATOR CLAMP OLL2;  Surgeon: Linden Dolin, MD;  Location: [facility] OR;  Service: Open Heart Surgery;  Laterality: N/A;   RADIAL ARTERY HARVEST Left 05/18/2020   Procedure: RADIAL ARTERY HARVEST;  Surgeon: Linden Dolin, MD;  Location: [facility] OR;  Service: Open Heart Surgery;  Laterality: Left;   RIGHT HEART CATH N/A 05/17/2020   Procedure: RIGHT HEART CATH;  Surgeon: Dolores Patty, MD;  [facility]: [facility] INVAS[facility] CV LAB;  Service: Cardiovascular;  Laterality: N/A;   RIGHT/LEFT HEART CATH AND CORONARY ANGIOGRAPHY N/A 05/17/2020   Procedure: RIGHT/LEFT HEART CATH AND CORONARY ANGIOGRAPHY;  Surgeon: Runell Gess, MD;  Location: [facility]VAS[facility] LAB;  Service: Cardiovascular;  Laterality: N/A;   SPACE OAR INSTILLATION N/A 08/26/2022   Procedure: SPACE OAR INSTILLATION;  Surgeon: Rene Paci, MD;  Location: [facility];  Service: Urology;  Laterality: N/A;  30 MINS FOR CASE   TEE WITHOUT CARDIOVERSION N/A 05/18/2020   Procedure: TRANSESOPHAGEAL ECHOCARDIOGRAM (TEE);  Surgeon: Linden Dolin, MD;  Location: [facility] OR;  Service: Open Heart Surgery;  Laterality: N/A;   THULIUM LASER TURP (TRANSURETHRAL RESECTION OF PROSTATE) N/A 04/15/2022   Procedure: Morton Peters LASER VAPORIZATION OF PROSTATE;  Surgeon: Jerilee Field, MD;  Location[facility];  Service: Urology;  Laterality: N/A;  90 MINS FOR CASE   TONSILLECTOMY AND ADENOIDECTOMY  1953   TRANSURETHRAL RESECTION OF PROSTATE N/A 04/15/2022   TRANSURETHRAL RESECTION OF PROSTATE  10/28/2022   Procedure: TRANSURETHRAL RESECTION OF THE PROSTATE (TURP);  Surgeon:  Jerilee Field, MD;  Location: [facility];  Service: Urology;;    MEDICATIONS:  acetaminophen (TYLENOL) 500 MG tablet   albuterol (VENTOLIN HFA) 108 (90 Base) MCG/ACT inhaler   alfuzosin (UROXATRAL) 10 MG 24 hr tablet   aspirin 81 MG EC tablet   cetirizine (ZYRTEC) 10 MG tablet   fluticasone (FLONASE) 50 MCG/ACT nasal spray   ketotifen (ZADITOR) 0.025 % ophthalmic solution   latanoprost (XALATAN) 0.005 % ophthalmic solution   losartan (COZAAR) 25 MG tablet   montelukast (SINGULAIR) 10 MG tablet   NON FORMULARY   rosuvastatin (CRESTOR) 20 MG tablet   sulfamethoxazole-trimethoprim (BACTRIM DS) 800-160 MG tablet   No current facility-administered medications for this encounter.   Ubaldo Glassing, PA-C [facility]  Short Stay/Anesthesiology [facility] Phone [phone] 05/15/2023 3:36 PM

## 2023-05-14 NOTE — Telephone Encounter (Signed)
No form received, sent to provider for recommendations/surg risk

## 2023-05-15 ENCOUNTER — Encounter (HOSPITAL_COMMUNITY): Payer: Self-pay

## 2023-05-15 ENCOUNTER — Ambulatory Visit (HOSPITAL_COMMUNITY)
Admission: RE | Admit: 2023-05-15 | Discharge: 2023-05-15 | Disposition: A | Discharge status: Home / Self Care | Payer: Medicare Other | Source: Ambulatory Visit | Attending: Family Medicine | Admitting: Family Medicine

## 2023-05-15 VITALS — BP 142/68 | HR 76 | Wt 230.0 lb

## 2023-05-15 DIAGNOSIS — I2510 Atherosclerotic heart disease of native coronary artery without angina pectoris: Secondary | ICD-10-CM | POA: Diagnosis not present

## 2023-05-15 DIAGNOSIS — G4733 Obstructive sleep apnea (adult) (pediatric): Secondary | ICD-10-CM | POA: Diagnosis not present

## 2023-05-15 DIAGNOSIS — C61 Malignant neoplasm of prostate: Secondary | ICD-10-CM | POA: Insufficient documentation

## 2023-05-15 DIAGNOSIS — I110 Hypertensive heart disease with heart failure: Secondary | ICD-10-CM | POA: Diagnosis not present

## 2023-05-15 DIAGNOSIS — I480 Paroxysmal atrial fibrillation: Secondary | ICD-10-CM | POA: Insufficient documentation

## 2023-05-15 DIAGNOSIS — Z01810 Encounter for preprocedural cardiovascular examination: Secondary | ICD-10-CM

## 2023-05-15 DIAGNOSIS — Z951 Presence of aortocoronary bypass graft: Secondary | ICD-10-CM | POA: Diagnosis present

## 2023-05-15 DIAGNOSIS — I5020 Unspecified systolic (congestive) heart failure: Secondary | ICD-10-CM | POA: Diagnosis present

## 2023-05-15 DIAGNOSIS — I252 Old myocardial infarction: Secondary | ICD-10-CM | POA: Insufficient documentation

## 2023-05-15 DIAGNOSIS — I350 Nonrheumatic aortic (valve) stenosis: Secondary | ICD-10-CM | POA: Insufficient documentation

## 2023-05-15 DIAGNOSIS — I5022 Chronic systolic (congestive) heart failure: Secondary | ICD-10-CM | POA: Diagnosis not present

## 2023-05-15 DIAGNOSIS — I255 Ischemic cardiomyopathy: Secondary | ICD-10-CM | POA: Diagnosis not present

## 2023-05-15 NOTE — Telephone Encounter (Signed)
Pam with [facility] aware of clearance however anesthesia has noted cardiac in person clearance is needed, reported in [place] notes pulmonary not area of concern cardiac is.   Add on 9/27 given to pt daughter

## 2023-05-15 NOTE — Patient Instructions (Addendum)
Medication Changes:  No Changes In Medications at this time.   Follow-Up in: 4-6 months with Dr. Gala Romney PLEASE CALL OUR OFFICE AROUND DECEMBER/JANUARY TO GET SCHEDULED FOR YOUR APPOINTMENT. PHONE NUMBER IS [phone] OPTION 2   At the [facility], you and your health needs are our priority. We have a designated team specialized in the treatment of Heart Failure. This Care Team includes your primary Heart Failure Specialized Cardiologist (physician), Advanced Practice Providers (APPs- Physician Assistants and Nurse Practitioners), and Pharmacist who all work together to provide you with the care you need, when you need it.   You may see any of the following providers on your designated Care Team at your next follow up:  Dr. Arvilla Meres Dr. Marca Ancona Dr. Marcos Eke, NP Robbie Lis, [place] [facility] [place], [place] Brynda Peon, NP Karle Plumber, PharmD   Please be sure to bring in all your medications bottles to every appointment.   Need to Contact us:  If you have any questions or concerns before your next appointment please send [place] a message through [place] or call our office at [phone].    TO LEAVE A MESSAGE FOR THE NURSE SELECT OPTION 2, PLEASE LEAVE A MESSAGE INCLUDING: YOUR NAME DATE OF BIRTH CALL BACK NUMBER REASON FOR CALL**this is important as we prioritize the call backs  YOU WILL RECEIVE A CALL BACK THE SAME DAY AS LONG AS YOU CALL BEFORE 4:00 PM

## 2023-05-15 NOTE — Progress Notes (Signed)
ReDS Vest / Clip - 05/15/23 1200       ReDS Vest / Clip   Station Marker D    Ruler Value 35.5    ReDS Value Range Low volume    ReDS Actual Value 33

## 2023-05-15 NOTE — Addendum Note (Signed)
Encounter addended by: Jacklynn Ganong, FNP on: 05/15/2023 12:20 PM  Actions taken: Visit diagnoses modified

## 2023-05-15 NOTE — Progress Notes (Signed)
Advanced Heart Failure Clinic Note    PCP: Tessie Eke, PA HF Cardiologist: Dr. Gala Romney    HPI  [name] is a 77 y.o.with obesity, HTN, OSA on CPAP, HL, prostate CA, PAF, CAD s/p CABG 05/18/20 and systolic HF.   Echo in 2020 that showed EF 60-65% with normal RV.    Presented to [facility] via EMS with chest pain and shortness of breath. Arrived in the ED was in A fib RVR and had ST depression. HS Trop [id number]. Cath showed severe multivessel disease with elevated filling pressures. Echo EF 25-30% with mild AS. IABP placed. Underwent CABG 05/18/20 with Maze and LAA clipping. LIMA -> LAD, RIMA  -> PDA. Left radial OM1-> OM2. Post CABG course was slow but relatively uneventful. Repeat echo 10/10 and showed EF improved 40-45% normal RV. Discharged home on 05/30/20.   Several med intolerances. Bisoprolol stopped due to low BP. Developed gynecomastia w/ spiro and started on eplerenone. Marcelline Deist stopped due recurrent UTIs. Off Eliquis due to hematuria.  Nuclear stress test at [facility] (2/23) showed no ischemia, EF 54%.  Follow up 2/23, had recent exertional dyspnea. Had a few low BP readings w/ dizziness. No med changes.  S/p TURP 8/23.  Seen in ED 04/24/22 with dizziness and SOB. Found to have UTI. D dimer +, CTA negative. Treated with IV ceftriaxone.   He stopped his Inspra on his own due to frequent urination with resolution of symptoms.   Last seen 04/2022.   S/p gold seed implant at [facility] (1/24), o anesthesia complications.   S/p insertion of suprapubic catheter (2/24), no anesthesia complications. S/p cystoscopy with bladder neck dilation, TURP 3/24.  Echo 7/24 EF 50%, moderate LVH, G2DD, RV ok, moderate TR, AoV mean gradient increased 11--> 28 mmHg  Today he returns for pre-op CV risk stratification for up-coming TURP. Overall feeling fine. He has mild dyspnea with allergies, has SOB pulling garbage cans up his driveway. Stays active, uses cane post R total knee replacement. Denies  palpitations, abnormal bleeding, CP, dizziness, edema, or PND/Orthopnea. Appetite ok. No fever or chills. Weight at home 223 pounds. Taking all medications, off ASA with hematuria. Wears CPAP. BP at home 119/72, HR 75.  Cardiac Studies:    - Echo (7/24) at [facility]; EF 50%, RV ok, AoV mean gradient increased from 11-->28 mmhg  - Nuclear stress test at [facility] (2/23): no ischemia, EF 54%  - RHC/LHC 05/17/20  Ost LM to Mid LM lesion is 99% stenosed. Ost RCA to Prox RCA lesion is 99% stenosed. Prox RCA to Mid RCA lesion is 80% stenosed. RA 17 PA 40/11 PCWP 28  CO 5 CI 2.2   - Echo (9/21): EF 25-30%, severe LV dysfunction, moderate LVH, RV OK  - Echo (10/21): EF 40-45%   Past Medical History:  Diagnosis Date   Aortic stenosis, mild    last echo in epic 05-27-2020  ef 40-45%,  mean grandiant , valve area 2.46 cm^2 ,  mild regurg   BPH with obstruction/lower urinary tract symptoms    urologist--- dr Mena Goes   Carotid bruit present 06/15/2018   2015 Carotid Dopplers:  Essentially normal carotid arteries, with very slight hard plaque in the proximal ICA's and serpentine distal vessels. 1-39% bilateral ICA stenosis. Patent vertebral arteries with antegrade flow. Normal subclavian arteries, bilaterally.   Chronic systolic CHF (congestive heart failure) (HCC) 05/2020   followed by dr Jeanelle Malling   COPD (chronic obstructive pulmonary disease) (HCC)    Coronary artery disease 04/2020  cardiologist--- dr Gala Romney;   NSTEMI /  Afib w/ RVR/  Acute CHF/  cardiogenic shock 04-20-2020;  cath 05-17-2020 multivessel disease w/ critical LM;   05-18-2020 s/p CABG x4 and Maze procedure;  nuclear stress study done [facility] 09-17-2021 no ischemia, ef 54%   Essential hypertension, benign 12/21/2008   Qualifier: Diagnosis of  By: Antoine Poche, MD, Gerrit Heck     Glaucoma, both eyes    History of migraine    History of non-ST elevation myocardial infarction (NSTEMI) 05/17/2020   cardiogenic shock,  AF w/ RVR, acute  CHF,  ICM;   s/p CABG w/ MAZE   History of squamous cell carcinoma of skin    History of TIA (transient ischemic attack) 02/28/2019   questionable TIA  per neurology note dr Pearlean Brownie, 04-12-2019, likely migraine   Hx of colonic polyps 01/09/2020   Hyperlipidemia    Ischemic cardiomyopathy 04/2020   per cath 09-03-20212  ef 25-30%;   last echo in epic 05-27-2020  ef 40-45%   Malignant neoplasm prostate (HCC) 10/2019   urologist--- dr Mena Goes;  dx 03/ 2021  active survillance   OA (osteoarthritis)    OSA on CPAP    Diagnosed at the [facility] 2016    PAF (paroxysmal atrial fibrillation) (HCC) 04/2020   followed by cardiology---  s/p MAZE procedure 05-18-2020, takes ASA   PVC's (premature ventricular contractions)    S/P CABG x 4 05/18/2020   LIMA to LAD,  RIMA to PDA, SVG to OM1 and OM2   Seasonal allergic rhinitis    Sinus bradycardia    event monitor 01-29-2017  mild bradycardia, no pauses, rare PVCs   Vitamin B 12 deficiency    Vitamin D deficiency 11/26/2010   Wears hearing aid in both ears     Current Outpatient Medications  Medication Sig Dispense Refill   acetaminophen (TYLENOL) 500 MG tablet Take 500-1,000 mg by mouth every 6 (six) hours as needed (pain.).     albuterol (VENTOLIN HFA) 108 (90 Base) MCG/ACT inhaler Inhale 1 puff into the lungs as needed for wheezing or shortness of breath.     alfuzosin (UROXATRAL) 10 MG 24 hr tablet Take 10 mg by mouth at bedtime.     cetirizine (ZYRTEC) 10 MG tablet Take 10 mg by mouth at bedtime.     fluticasone (FLONASE) 50 MCG/ACT nasal spray USE TWO SPRAYS IN EACH NOSTRIL DAILY AS NEEDED. 48 mL 0   ketotifen (ZADITOR) 0.025 % ophthalmic solution Place 1 drop into both eyes daily as needed (allergies).     latanoprost (XALATAN) 0.005 % ophthalmic solution INSTILL 1 DROP INTO BOTH EYES AT BEDTIME (Patient taking differently: Place 1 drop into both eyes in the morning.) 11.25 mL 1   losartan (COZAAR) 25 MG tablet TAKE 0.5 TABLETS BY  MOUTH AT BEDTIME. 15 tablet 11   montelukast (SINGULAIR) 10 MG tablet TAKE 1 TABLET BY MOUTH EVERY DAY AT BEDTIME 90 tablet 3   NON FORMULARY Cpap at night     rosuvastatin (CRESTOR) 20 MG tablet Take 1 tablet (20 mg total) by mouth daily. (Patient taking differently: Take 20 mg by mouth at bedtime.) 90 tablet 3   sulfamethoxazole-trimethoprim (BACTRIM DS) 800-160 MG tablet Take 1 tablet by mouth 2 (two) times daily. 70 tablet 0   aspirin 81 MG EC tablet Take 81 mg by mouth at bedtime. (Patient not taking: Reported on 05/15/2023)     No current facility-administered medications for this encounter.   Allergies  Allergen Reactions  Flomax [Tamsulosin] Other (See Comments)    dizziness   Cipro [Ciprofloxacin Hcl] Swelling    Knee swelling, joint pain   Lipitor [Atorvastatin] Other (See Comments)    Leg Cramps   Lisinopril Cough   Spironolactone Other (See Comments)    Grew breasts    Social History   Socioeconomic History   Marital status: Married    Spouse name: Not on file   Number of children: Not on file   Years of education: 13   Highest education level: Not on file  Occupational History   Occupation: retired  Tobacco Use   Smoking status: Former    Current packs/day: 0.00    Types: Cigarettes    Start date: 1964    Quit date: 1967    Years since quitting: 57.7   Smokeless tobacco: Never  Vaping Use   Vaping status: Never Used  Substance and Sexual Activity   Alcohol use: Not Currently    Comment: no drinks in 3 weeks   Drug use: Never   Sexual activity: Yes  Other Topics Concern   Not on file  Social History Narrative   Is a [place] veteran, has trouble sleeping most nights. Often experiences nightmares w/anesthesia meds.   Social Determinants of Health   Financial Resource Strain: Low Risk  (03/05/2020)   Overall Financial Resource Strain (CARDIA)    Difficulty of Paying Living Expenses: Not hard at all  Food Insecurity: No Food Insecurity (09/13/2021)    Received from [facility] [address], [facility]   Hunger Vital Sign    Worried About Running Out of Food in the Last Year: Never true    Ran Out of Food in the Last Year: Never true  Transportation Needs: No Transportation Needs (03/05/2020)   PRAPARE - Administrator, Civil Service (Medical): No    Lack of Transportation (Non-Medical): No  Physical Activity: Unknown (03/05/2020)   Exercise Vital Sign    Days of Exercise per Week: 7 days    Minutes of Exercise per Session: Not on file  Stress: No Stress Concern Present (03/05/2020)   [employer] of Occupational Health - Occupational Stress Questionnaire    Feeling of Stress : Not at all  Social Connections: Unknown (12/27/2021)   Received from [facility], [facility]   Social Network    Social Network: Not on file  Intimate Partner Violence: Unknown (11/18/2021)   Received from [facility], [facility]   HITS    Physically Hurt: Not on file    Insult or Talk Down To: Not on file    Threaten Physical Harm: Not on file    Scream or Curse: Not on file   Family History  Problem Relation Age of Onset   Diabetes Mother    COPD Father    Stroke Paternal Grandfather    BP (!) 142/68   Pulse 76   Wt 104.3 kg (230 lb)   SpO2 97%   BMI 34.46 kg/m   Wt Readings from Last 3 Encounters:  05/15/23 104.3 kg (230 lb)  05/11/23 103.9 kg (229 lb)  10/28/22 99.8 kg (220 lb)   Physical Exam General:  NAD. No resp difficulty, walked into clinic HEENT: Normal Neck: Supple. No JVD. Carotids 2+ bilat; no bruits. No lymphadenopathy or thryomegaly appreciated. Cor: PMI nondisplaced. Regular rate & rhythm. No rubs, gallops, 2/6 AS with clear S2 Lungs: Clear Abdomen: Soft, nontender, nondistended. No hepatosplenomegaly. No bruits or masses. Good bowel sounds. Extremities: No cyanosis, clubbing,  rash, edema Neuro: Alert & oriented x 3, cranial nerves grossly intact. Moves all 4 extremities w/o difficulty. Affect pleasant.  ECG  (personally reviewed from 05/11/23): NSR 68 bpm  ReDs: 33%  ASSESSMENT & PLAN:  1. CAD/NSTEMI with critcal LM disease - LHC 9/21 with severe multivessel disease.  - s/p CABG 05/18/20 with Maze and LAA clipping. LIMA -> LAD, RIMA  -> PDA. Left radial OM1-> OM2 - NST (2/23) at [facility] negative for ischemia  - No chest pain. - ASA on hold with up-coming TURP & hematuria. Plan to restart as able, after TURP. - Continue statin and ? blocker.  2. Chronic Systolic Heart Failure due to ischemic CM - Echo (2020): EF 60-65%.  - Echo (9/21): EF 25-30%. RV normal. Mild AS   - Echo (10/21): EF improved 40-45% normal RV - Nuclear [facility] at [facility] 2/23 w/ EF 54%  - Echo (7/24): EF 50%, G2DD, RV normal, moderate AS - Improved NYHA I-II, Euvolemic on exam, REDs 33%. GDMT previously limited by orthostasis - Continue losartan 12.5 mg at bedtime (dizziness on Entresto). BP up a bit today, but will not increase today with home BP stable. - Off bisoprolol due to low BP.  - He stopped Inspra (due to increased urination). OK to stay off for now. - Off Jardiance with UTIs. - Recent labs reviewed and look OK, K 3.9, creatinine 0.94  3. PAF  - s/p MAZE w/ LAA Clip  - Off Eliquis with hematuria.   - NSR on recent ECG.  4. H/o Prostate Cancer - Continue alfuzosin - planning 4th TURP soon.  5. Aortic Stenosis - Echo (7/24) with moderate AS, AVA 0.9 cm2, dimensionless index 0.28, mean gradient 28 mmHg - Asymptomatic, follow for now. - Can consider referral to Structural heart team down the road.  6. Pre-Op CV risk stratification - Moderate risk due to previous cardiac history, but not prohibitive.  - Revised Cardiac Risk Index score = 2 pts (10% 30-day risk of death, MI or cardiac arrest)  Follow up in 4-6 months with Dr. Gala Romney.  Anderson Malta Helemano, FNP 05/15/23

## 2023-05-15 NOTE — Anesthesia Preprocedure Evaluation (Signed)
Anesthesia Evaluation  Patient identified by MRN, date of birth, ID band Patient awake    Reviewed: Allergy & Precautions, NPO status , Patient's Chart, lab work & pertinent test results  Airway Mallampati: IV  TM Distance: >3 FB Neck ROM: Full    Dental  (+) Teeth Intact, Dental Advisory Given   Pulmonary asthma , sleep apnea and Continuous Positive Airway Pressure Ventilation , COPD (no inhalers), former smoker   Pulmonary exam normal breath sounds clear to auscultation       Cardiovascular hypertension (114/62 preop), Pt. on medications + CAD, + Past MI, + CABG (CABG x 4 2021) and +CHF (LVEF 40-45%, grade 2 diastolic dysfunction)  Normal cardiovascular exam Rhythm:Regular Rate:Normal  Echo 2021  1. Septal hypokinesis and dyskinesis. Basal to mid inferior hypokinesis..  Left ventricular ejection fraction, by estimation, is 40 to 45%. The left  ventricle has mildly decreased function. The left ventricle demonstrates  regional wall motion  abnormalities (see scoring diagram/findings for description). There is  moderate concentric left ventricular hypertrophy. Left ventricular  diastolic parameters are consistent with Grade II diastolic dysfunction  (pseudonormalization).   2. Trivial mitral valve regurgitation.   3. The aortic valve is calcified. There is moderate calcification of the  aortic valve. There is moderate thickening of the aortic valve. Aortic  valve regurgitation is mild. Mild aortic valve stenosis.   4. Aortic dilatation noted. There is mild dilatation of the ascending  aorta, measuring 40 mm.   5. There is normal pulmonary artery systolic pressure.     Neuro/Psych TIA negative psych ROS   GI/Hepatic negative GI ROS, Neg liver ROS,,,  Endo/Other  Obesity BMI 35  Renal/GU negative Renal ROS  negative genitourinary   Musculoskeletal  (+) Arthritis , Osteoarthritis,    Abdominal  (+) + obese  Peds   Hematology Hb 12.8, plt 162   Anesthesia Other Findings   Reproductive/Obstetrics negative OB ROS                             Anesthesia Physical Anesthesia Plan  ASA: 3  Anesthesia Plan: General   Post-op Pain Management:    Induction: Intravenous  PONV Risk Score and Plan: 2 and Ondansetron, Dexamethasone and Treatment may vary due to age or medical condition  Airway Management Planned: LMA  Additional Equipment: None  Intra-op Plan:   Post-operative Plan: Extubation in OR  Informed Consent: I have reviewed the patients History and Physical, chart, labs and discussed the procedure including the risks, benefits and alternatives for the proposed anesthesia with the patient or authorized representative who has indicated his/her understanding and acceptance.     Dental advisory given  Plan Discussed with: CRNA  Anesthesia Plan Comments: ( )        Anesthesia Quick Evaluation

## 2023-05-19 ENCOUNTER — Ambulatory Visit (HOSPITAL_COMMUNITY)
Admission: RE | Admit: 2023-05-19 | Discharge: 2023-05-19 | Disposition: A | Discharge status: Home / Self Care | Payer: No Typology Code available for payment source | Source: Ambulatory Visit | Attending: Urology | Admitting: Urology

## 2023-05-19 ENCOUNTER — Encounter (HOSPITAL_COMMUNITY): Payer: Self-pay | Admitting: Urology

## 2023-05-19 ENCOUNTER — Ambulatory Visit (HOSPITAL_COMMUNITY): Payer: No Typology Code available for payment source | Admitting: Medical

## 2023-05-19 ENCOUNTER — Other Ambulatory Visit: Payer: Self-pay

## 2023-05-19 ENCOUNTER — Encounter (HOSPITAL_COMMUNITY)
Admission: RE | Disposition: A | Discharge status: Home / Self Care | Payer: Self-pay | Source: Ambulatory Visit | Attending: Urology

## 2023-05-19 ENCOUNTER — Ambulatory Visit (HOSPITAL_BASED_OUTPATIENT_CLINIC_OR_DEPARTMENT_OTHER): Payer: No Typology Code available for payment source | Admitting: Anesthesiology

## 2023-05-19 DIAGNOSIS — Z8546 Personal history of malignant neoplasm of prostate: Secondary | ICD-10-CM | POA: Diagnosis not present

## 2023-05-19 DIAGNOSIS — I252 Old myocardial infarction: Secondary | ICD-10-CM | POA: Insufficient documentation

## 2023-05-19 DIAGNOSIS — N401 Enlarged prostate with lower urinary tract symptoms: Secondary | ICD-10-CM | POA: Diagnosis not present

## 2023-05-19 DIAGNOSIS — J449 Chronic obstructive pulmonary disease, unspecified: Secondary | ICD-10-CM | POA: Diagnosis not present

## 2023-05-19 DIAGNOSIS — R310 Gross hematuria: Secondary | ICD-10-CM | POA: Diagnosis not present

## 2023-05-19 DIAGNOSIS — E669 Obesity, unspecified: Secondary | ICD-10-CM | POA: Diagnosis not present

## 2023-05-19 DIAGNOSIS — Z8673 Personal history of transient ischemic attack (TIA), and cerebral infarction without residual deficits: Secondary | ICD-10-CM | POA: Diagnosis not present

## 2023-05-19 DIAGNOSIS — I110 Hypertensive heart disease with heart failure: Secondary | ICD-10-CM | POA: Diagnosis not present

## 2023-05-19 DIAGNOSIS — I10 Essential (primary) hypertension: Secondary | ICD-10-CM

## 2023-05-19 DIAGNOSIS — Z87891 Personal history of nicotine dependence: Secondary | ICD-10-CM | POA: Diagnosis not present

## 2023-05-19 DIAGNOSIS — I2510 Atherosclerotic heart disease of native coronary artery without angina pectoris: Secondary | ICD-10-CM | POA: Insufficient documentation

## 2023-05-19 DIAGNOSIS — Z951 Presence of aortocoronary bypass graft: Secondary | ICD-10-CM | POA: Diagnosis not present

## 2023-05-19 DIAGNOSIS — N138 Other obstructive and reflux uropathy: Secondary | ICD-10-CM

## 2023-05-19 DIAGNOSIS — N400 Enlarged prostate without lower urinary tract symptoms: Secondary | ICD-10-CM | POA: Insufficient documentation

## 2023-05-19 DIAGNOSIS — I5022 Chronic systolic (congestive) heart failure: Secondary | ICD-10-CM | POA: Diagnosis not present

## 2023-05-19 DIAGNOSIS — Z6835 Body mass index (BMI) 35.0-35.9, adult: Secondary | ICD-10-CM | POA: Insufficient documentation

## 2023-05-19 DIAGNOSIS — Z01818 Encounter for other preprocedural examination: Secondary | ICD-10-CM

## 2023-05-19 HISTORY — PX: TRANSURETHRAL RESECTION OF PROSTATE: SHX73

## 2023-05-19 SURGERY — TURP (TRANSURETHRAL RESECTION OF PROSTATE)
Anesthesia: General

## 2023-05-19 MED ORDER — AMISULPRIDE (ANTIEMETIC) 5 MG/2ML IV SOLN: 10.0000 mg | Freq: Once | INTRAVENOUS | Status: DC | PRN

## 2023-05-19 MED ORDER — ONDANSETRON HCL 4 MG/2ML IJ SOLN
INTRAMUSCULAR | Status: DC | PRN
  Administered 2023-05-19: 4 mg via INTRAVENOUS

## 2023-05-19 MED ORDER — CHLORHEXIDINE GLUCONATE 0.12 % MT SOLN
15.0000 mL | Freq: Once | OROMUCOSAL | Status: AC
  Administered 2023-05-19: 15 mL via OROMUCOSAL

## 2023-05-19 MED ORDER — FENTANYL CITRATE (PF) 100 MCG/2ML IJ SOLN
INTRAMUSCULAR | Status: DC | PRN
  Administered 2023-05-19 (×4): 25 ug via INTRAVENOUS

## 2023-05-19 MED ORDER — OXYCODONE HCL 5 MG/5ML PO SOLN: 5.0000 mg | Freq: Once | ORAL | Status: DC | PRN

## 2023-05-19 MED ORDER — PROPOFOL 10 MG/ML IV BOLUS
INTRAVENOUS | Status: AC
  Filled 2023-05-19: qty 20

## 2023-05-19 MED ORDER — EPHEDRINE 5 MG/ML INJ
INTRAVENOUS | Status: AC
  Filled 2023-05-19: qty 5

## 2023-05-19 MED ORDER — SODIUM CHLORIDE 0.9 % IR SOLN
Status: DC | PRN
  Administered 2023-05-19: 3000 mL
  Administered 2023-05-19: 9000 mL

## 2023-05-19 MED ORDER — ONDANSETRON HCL 4 MG/2ML IJ SOLN: 4.0000 mg | Freq: Once | INTRAMUSCULAR | Status: DC | PRN

## 2023-05-19 MED ORDER — PHENYLEPHRINE 80 MCG/ML (10ML) SYRINGE FOR IV PUSH (FOR BLOOD PRESSURE SUPPORT)
PREFILLED_SYRINGE | INTRAVENOUS | Status: AC
  Filled 2023-05-19: qty 10

## 2023-05-19 MED ORDER — LIDOCAINE HCL (PF) 2 % IJ SOLN
INTRAMUSCULAR | Status: AC
  Filled 2023-05-19: qty 5

## 2023-05-19 MED ORDER — ACETAMINOPHEN 500 MG PO TABS
1000.0000 mg | ORAL_TABLET | Freq: Once | ORAL | Status: AC
  Administered 2023-05-19: 1000 mg via ORAL
  Filled 2023-05-19: qty 2

## 2023-05-19 MED ORDER — GLYCOPYRROLATE 0.2 MG/ML IJ SOLN
INTRAMUSCULAR | Status: DC | PRN
Start: 2023-05-19 — End: 2023-05-19
  Administered 2023-05-19 (×2): .1 mg via INTRAVENOUS

## 2023-05-19 MED ORDER — LIDOCAINE HCL (CARDIAC) PF 100 MG/5ML IV SOSY
PREFILLED_SYRINGE | INTRAVENOUS | Status: DC | PRN
  Administered 2023-05-19: 80 mg via INTRAVENOUS

## 2023-05-19 MED ORDER — FENTANYL CITRATE (PF) 100 MCG/2ML IJ SOLN
INTRAMUSCULAR | Status: AC
  Filled 2023-05-19: qty 2

## 2023-05-19 MED ORDER — EPHEDRINE SULFATE (PRESSORS) 50 MG/ML IJ SOLN
INTRAMUSCULAR | Status: DC | PRN
  Administered 2023-05-19 (×2): 5 mg via INTRAVENOUS

## 2023-05-19 MED ORDER — OXYCODONE HCL 5 MG PO TABS: 5.0000 mg | ORAL_TABLET | Freq: Once | ORAL | Status: DC | PRN

## 2023-05-19 MED ORDER — LACTATED RINGERS IV SOLN: INTRAVENOUS | Status: DC

## 2023-05-19 MED ORDER — SULFAMETHOXAZOLE-TRIMETHOPRIM 800-160 MG PO TABS: 1.0000 | ORAL_TABLET | Freq: Every evening | ORAL | Status: DC

## 2023-05-19 MED ORDER — SODIUM CHLORIDE 0.9 % IV SOLN
2.0000 g | INTRAVENOUS | Status: AC
  Administered 2023-05-19: 2 g via INTRAVENOUS
  Filled 2023-05-19: qty 20

## 2023-05-19 MED ORDER — ORAL CARE MOUTH RINSE: 15.0000 mL | Freq: Once | OROMUCOSAL | Status: AC

## 2023-05-19 MED ORDER — PHENYLEPHRINE HCL (PRESSORS) 10 MG/ML IV SOLN
INTRAVENOUS | Status: DC | PRN
  Administered 2023-05-19 (×2): 80 ug via INTRAVENOUS

## 2023-05-19 MED ORDER — PHENYLEPHRINE HCL-NACL 20-0.9 MG/250ML-% IV SOLN
INTRAVENOUS | Status: AC
  Filled 2023-05-19: qty 250

## 2023-05-19 MED ORDER — DEXAMETHASONE SODIUM PHOSPHATE 10 MG/ML IJ SOLN
INTRAMUSCULAR | Status: AC
  Filled 2023-05-19: qty 1

## 2023-05-19 MED ORDER — HYDROMORPHONE HCL 1 MG/ML IJ SOLN: 0.2500 mg | INTRAMUSCULAR | Status: DC | PRN

## 2023-05-19 MED ORDER — ONDANSETRON HCL 4 MG/2ML IJ SOLN
INTRAMUSCULAR | Status: AC
  Filled 2023-05-19: qty 2

## 2023-05-19 MED ORDER — PHENYLEPHRINE HCL-NACL 20-0.9 MG/250ML-% IV SOLN
INTRAVENOUS | Status: DC | PRN
  Administered 2023-05-19: 40 ug/min via INTRAVENOUS

## 2023-05-19 MED ORDER — PROPOFOL 10 MG/ML IV BOLUS
INTRAVENOUS | Status: DC | PRN
  Administered 2023-05-19: 150 mg via INTRAVENOUS

## 2023-05-19 MED ORDER — DEXAMETHASONE SODIUM PHOSPHATE 4 MG/ML IJ SOLN
INTRAMUSCULAR | Status: DC | PRN
  Administered 2023-05-19: 4 mg via INTRAVENOUS

## 2023-05-19 SURGICAL SUPPLY — 20 items
BAG DRN RND TRDRP ANRFLXCHMBR (UROLOGICAL SUPPLIES) ×1
BAG URINE DRAIN 2000ML AR STRL (UROLOGICAL SUPPLIES) ×1 IMPLANT
BAG URO CATCHER STRL LF (MISCELLANEOUS) ×2 IMPLANT
CATH FOLEY 2WAY SLVR 30CC 20FR (CATHETERS) IMPLANT
DRAPE FOOT SWITCH (DRAPES) ×1 IMPLANT
EVACUATOR MICROVAS BLADDER (UROLOGICAL SUPPLIES) ×1 IMPLANT
GLOVE SURG LX STRL 7.5 STRW (GLOVE) ×1 IMPLANT
GOWN STRL REUS W/ TWL XL LVL3 (GOWN DISPOSABLE) ×1 IMPLANT
GOWN STRL REUS W/TWL XL LVL3 (GOWN DISPOSABLE) ×1
HOLDER FOLEY CATH W/STRAP (MISCELLANEOUS) IMPLANT
KIT TURNOVER KIT A (KITS) IMPLANT
LOOP CUT BIPOLAR 24F LRG (ELECTROSURGICAL) IMPLANT
MANIFOLD NEPTUNE II (INSTRUMENTS) ×2 IMPLANT
PACK CYSTO (CUSTOM PROCEDURE TRAY) ×2 IMPLANT
PAD PREP 24X48 CUFFED NSTRL (MISCELLANEOUS) ×1 IMPLANT
SYR 30ML LL (SYRINGE) IMPLANT
SYR TOOMEY IRRIG 70ML (MISCELLANEOUS) ×1
SYRINGE TOOMEY IRRIG 70ML (MISCELLANEOUS) ×1 IMPLANT
TUBING CONNECTING 10 (TUBING) ×2 IMPLANT
TUBING UROLOGY SET (TUBING) ×1 IMPLANT

## 2023-05-19 NOTE — Anesthesia Postprocedure Evaluation (Signed)
Anesthesia Post Note  Patient: CORDAY WYKA  Procedure(s) Performed: TRANSURETHRAL RESECTION OF THE PROSTATE (TURP)     Patient location during evaluation: PACU Anesthesia Type: General Level of consciousness: awake and alert, oriented and patient cooperative Pain management: pain level controlled Vital Signs Assessment: post-procedure vital signs reviewed and stable Respiratory status: spontaneous breathing, nonlabored ventilation and respiratory function stable Cardiovascular status: blood pressure returned to baseline and stable Postop Assessment: no apparent nausea or vomiting Anesthetic complications: no   No notable events documented.  Last Vitals:  Vitals:   05/19/23 1130 05/19/23 1145  BP: 118/74 127/67  Pulse: 66 69  Resp: (!) 22 17  Temp:    SpO2: 96% 99%    Last Pain:  Vitals:   05/19/23 1145  TempSrc:   PainSc: 0-No pain                 Lannie Fields

## 2023-05-19 NOTE — H&P (Signed)
H&P  Chief Complaint: BPH, gross hematuria   History of Present Illness: Laron is a 77 year old male with a history of BPH.  He had about 100 g prostate.  He was started on finasteride.  He was also on active surveillance for prostate cancer.  He underwent a thulium laser vaporization of the prostate August 2023 in preparation for his prostate cancer radiation.  He did well.  He then started ADT and underwent radiation for his prostate cancer in early 2024.  By February 2024 he had a weak stream and cystoscopy revealed a blind-ending prostatic urethra.  He underwent a suprapubic tube.  He was then taken for antegrade and retrograde access and TURP March 2024.  The prostatic lobes had appeared to have fused together.  He did well and passed a voiding trial.  The suprapubic tube was removed.  He completed radiation for his prostate cancer March 2024.  More recently has been having episodes of gross hematuria, a slower stream and passing tissue.  Office cystoscopy August 2024 revealed the lobes were trying to stick back together with some typical friable vessels.  The bladder neck was patent but there was some necrotic like tissue or dystrophic calcification around it.  He is brought today for a TURP residual simply to clean out any other chronic tissue.  Make sure he has a good channel and fulgurate any friable vessels.  He understands thoroughly the risk of bleeding, infection, stricture and incontinence among others.  He has been well without dysuria or gross hematuria.  He is on Bactrim.  Office urine culture was negative.  He has had no cough cold or congestion.  No fever or chills.   Past Medical History:  Diagnosis Date   Aortic stenosis, mild    last echo in epic 05-27-2020  ef 40-45%,  mean grandiant , valve area 2.46 cm^2 ,  mild regurg   BPH with obstruction/lower urinary tract symptoms    urologist--- dr Mena Goes   Carotid bruit present 06/15/2018   2015 Carotid Dopplers:  Essentially  normal carotid arteries, with very slight hard plaque in the proximal ICA's and serpentine distal vessels. 1-39% bilateral ICA stenosis. Patent vertebral arteries with antegrade flow. Normal subclavian arteries, bilaterally.   Chronic systolic CHF (congestive heart failure) (HCC) 05/2020   followed by dr Jeanelle Malling   COPD (chronic obstructive pulmonary disease) (HCC)    Coronary artery disease 04/2020   cardiologist--- dr Gala Romney;   NSTEMI /  Afib w/ RVR/  Acute CHF/  cardiogenic shock 04-20-2020;  cath 05-17-2020 multivessel disease w/ critical LM;   05-18-2020 s/p CABG x4 and Maze procedure;  nuclear stress study done [facility] 09-17-2021 no ischemia, ef 54%   Essential hypertension, benign 12/21/2008   Qualifier: Diagnosis of  By: Antoine Poche, MD, Gerrit Heck     Glaucoma, both eyes    History of migraine    History of non-ST elevation myocardial infarction (NSTEMI) 05/17/2020   cardiogenic shock,  AF w/ RVR, acute CHF,  ICM;   s/p CABG w/ MAZE   History of squamous cell carcinoma of skin    History of TIA (transient ischemic attack) 02/28/2019   questionable TIA  per neurology note dr Pearlean Brownie, 04-12-2019, likely migraine   Hx of colonic polyps 01/09/2020   Hyperlipidemia    Ischemic cardiomyopathy 04/2020   per cath 09-03-20212  ef 25-30%;   last echo in epic 05-27-2020  ef 40-45%   Malignant neoplasm prostate (HCC) 10/2019   urologist--- dr Mena Goes;  dx 03/  2021  active survillance   OA (osteoarthritis)    OSA on CPAP    Diagnosed at the [facility] 2016    PAF (paroxysmal atrial fibrillation) (HCC) 04/2020   followed by cardiology---  s/p MAZE procedure 05-18-2020, takes ASA   PVC's (premature ventricular contractions)    S/P CABG x 4 05/18/2020   LIMA to LAD,  RIMA to PDA, SVG to OM1 and OM2   Seasonal allergic rhinitis    Sinus bradycardia    event monitor 01-29-2017  mild bradycardia, no pauses, rare PVCs   Vitamin B 12 deficiency    Vitamin D deficiency 11/26/2010   Wears  hearing aid in both ears    Past Surgical History:  Procedure Laterality Date   CATARACT EXTRACTION W/ INTRAOCULAR LENS IMPLANT Bilateral    CLIPPING OF ATRIAL APPENDAGE Left 05/18/2020   Procedure: CLIPPING OF ATRIAL APPENDAGE USING ATRICURE 45 MM ATRICLIP;  Surgeon: Linden Dolin, MD;  Location: [facility];  Service: Open Heart Surgery;  Laterality: Left;   COLONOSCOPY     per pt last one approx 2019  by dr Loreta Ave   CORONARY ARTERY BYPASS GRAFT N/A 05/18/2020   Procedure: CORONARY ARTERY BYPASS GRAFTING (CABG) TIMES FOUR USING INTERNAL BILATERAL MAMMARY ARTERIES AND HARVESTED LEFT RADIAL ARTERY.;  Surgeon: Linden Dolin, MD;  Location: [facility];  Service: Open Heart Surgery;  Laterality: N/A;  CORONARY ENDARTERECTOMY PERFORMED DURING SURGERY    CYSTOSCOPY WITH URETHRAL DILATATION N/A 10/28/2022   Procedure: CYSTOSCOPY WITH BLADDER NECK DILATATION;  Surgeon: Jerilee Field, MD;  Location: [facility];  Service: Urology;  Laterality: N/A;  75 MINS   GOLD SEED IMPLANT N/A 08/26/2022   Procedure: GOLD SEED IMPLANT;  Surgeon: Rene Paci, MD;  Location: [facility];  Service: Urology;  Laterality: N/A;   IABP INSERTION N/A 05/17/2020   Procedure: IABP INSERTION;  Surgeon: Dolores Patty, MD;  [facility]: [facility];  Service: Cardiovascular;  Laterality: N/A;   INSERTION OF SUPRAPUBIC CATHETER N/A 10/10/2022   Procedure: INSERTION OF SUPRAPUBIC CATHETER cystoscopy urethral dilation;  Surgeon: Despina Arias, MD;  Location: [facility];  Service: Urology;  Laterality: N/A;   KNEE ARTHROPLASTY Right 03/14/2020   Procedure: COMPUTER ASSISTED TOTAL KNEE ARTHROPLASTY;  Surgeon: Samson Frederic, MD;  Location: [facility];  Service: Orthopedics;  Laterality: Right;   LAPAROSCOPIC CHOLECYSTECTOMY  02/26/2000   @[facility]   by dr Jamey Ripa   MAZE N/A 05/18/2020   Procedure: MAZE USING ATRICURE ISOLATOR CLAMP OLL2;  Surgeon: Linden Dolin, MD;  Location: [facility];  Service: Open Heart  Surgery;  Laterality: N/A;   RADIAL ARTERY HARVEST Left 05/18/2020   Procedure: RADIAL ARTERY HARVEST;  Surgeon: Linden Dolin, MD;  Location: [facility];  Service: Open Heart Surgery;  Laterality: Left;   RIGHT HEART CATH N/A 05/17/2020   Procedure: RIGHT HEART CATH;  Surgeon: Dolores Patty, MD;  [facility];  Service: Cardiovascular;  Laterality: N/A;   RIGHT/LEFT HEART CATH AND CORONARY ANGIOGRAPHY N/A 05/17/2020   Procedure: RIGHT/LEFT HEART CATH AND CORONARY ANGIOGRAPHY;  Surgeon: Runell Gess, MD;  [facility];  Service: Cardiovascular;  Laterality: N/A;   SPACE OAR INSTILLATION N/A 08/26/2022   Procedure: SPACE OAR INSTILLATION;  Surgeon: Rene Paci, MD;  Location: [facility];  Service: Urology;  Laterality: N/A;  30 MINS FOR CASE   TEE WITHOUT CARDIOVERSION N/A 05/18/2020   Procedure: TRANSESOPHAGEAL ECHOCARDIOGRAM (TEE);  Surgeon: Vickey Sages,  Merri Brunette, MD;  Location: [facility];  Service: Open Heart Surgery;  Laterality: N/A;   THULIUM LASER TURP (TRANSURETHRAL RESECTION OF PROSTATE) N/A 04/15/2022   Procedure: Morton Peters LASER VAPORIZATION OF PROSTATE;  Surgeon: Jerilee Field, MD;  Location: [facility];  Service: Urology;  Laterality: N/A;  90 MINS FOR CASE   TONSILLECTOMY AND ADENOIDECTOMY  1952   TRANSURETHRAL RESECTION OF PROSTATE N/A 04/15/2022   TRANSURETHRAL RESECTION OF PROSTATE  10/28/2022   Procedure: TRANSURETHRAL RESECTION OF THE PROSTATE (TURP);  Surgeon: Jerilee Field, MD;  Location: [facility];  Service: Urology;;    Home Medications:  Medications Prior to Admission  Medication Sig Dispense Refill Last Dose   acetaminophen (TYLENOL) 500 MG tablet Take 500-1,000 mg by mouth every 6 (six) hours as needed (pain.).   05/18/2023   alfuzosin (UROXATRAL) 10 MG 24 hr tablet Take 10 mg by mouth at bedtime.   05/18/2023   cetirizine (ZYRTEC) 10 MG tablet Take 10 mg by mouth at bedtime.   05/18/2023    fluticasone (FLONASE) 50 MCG/ACT nasal spray USE TWO SPRAYS IN EACH NOSTRIL DAILY AS NEEDED. 48 mL 0    ketotifen (ZADITOR) 0.025 % ophthalmic solution Place 1 drop into both eyes daily as needed (allergies).   Past Month   latanoprost (XALATAN) 0.005 % ophthalmic solution INSTILL 1 DROP INTO BOTH EYES AT BEDTIME (Patient taking differently: Place 1 drop into both eyes in the morning.) 11.25 mL 1 05/19/2023 at 0730   losartan (COZAAR) 25 MG tablet TAKE 0.5 TABLETS BY MOUTH AT BEDTIME. 15 tablet 11 05/18/2023   montelukast (SINGULAIR) 10 MG tablet TAKE 1 TABLET BY MOUTH EVERY DAY AT BEDTIME 90 tablet 3 05/18/2023   NON FORMULARY Cpap at night   05/18/2023   rosuvastatin (CRESTOR) 20 MG tablet Take 1 tablet (20 mg total) by mouth daily. (Patient taking differently: Take 20 mg by mouth at bedtime.) 90 tablet 3 05/18/2023   sulfamethoxazole-trimethoprim (BACTRIM DS) 800-160 MG tablet Take 1 tablet by mouth 2 (two) times daily. 70 tablet 0 05/18/2023   albuterol (VENTOLIN HFA) 108 (90 Base) MCG/ACT inhaler Inhale 1 puff into the lungs as needed for wheezing or shortness of breath.   More than a month   aspirin 81 MG EC tablet Take 81 mg by mouth at bedtime. (Patient not taking: Reported on 05/15/2023)   04/07/2023   Allergies:  Allergies  Allergen Reactions   Flomax [Tamsulosin] Other (See Comments)    dizziness   Cipro [Ciprofloxacin Hcl] Swelling    Knee swelling, joint pain   Lipitor [Atorvastatin] Other (See Comments)    Leg Cramps   Lisinopril Cough   Spironolactone Other (See Comments)    Grew breasts     Family History  Problem Relation Age of Onset   Diabetes Mother    COPD Father    Stroke Paternal Grandfather    Social History:  reports that he quit smoking about 57 years ago. His smoking use included cigarettes. He started smoking about 60 years ago. He has never used smokeless tobacco. He reports that he does not currently use alcohol. He reports that he does not use  drugs.  ROS: A complete review of systems was performed.  All systems are negative except for pertinent findings as noted. Review of Systems  All other systems reviewed and are negative.   Physical Exam:  Vital signs in last 24 hours: Temp:  [97.8 F (36.6 C)] 97.8 F (36.6 C) (10/01 0911) Pulse Rate:  [65]  65 (10/01 0911) Resp:  [18] 18 (10/01 0911) BP: (114)/(62) 114/62 (10/01 0911) SpO2:  [96 %] 96 % (10/01 0911) Weight:  [104.3 kg] 104.3 kg (10/01 0911) General:  Alert and oriented, No acute distress HEENT: Normocephalic, atraumatic Cardiovascular: Regular rate and rhythm Lungs: Clear bilaterally Abdomen: Soft, nontender, nondistended, no abdominal masses Back: No CVA tenderness Extremities: No edema Neurologic: Grossly intact  Laboratory Data:  No results found for this or any previous visit (from the past 24 hour(s)). No results found for this or any previous visit (from the past 240 hour(s)). Creatinine: No results for input(s): "CREATININE" in the last 168 hours.  Impression/Assessment:  BPH with recurrent gross hematuria and obstruction status post radiation for prostate cancer-  Plan:  I discussed with the patient the nature, potential benefits, risks and alternatives to TURP residual, including side effects of the proposed treatment, the likelihood of the patient achieving the goals of the procedure, and any potential problems that might occur during the procedure or recuperation. All questions answered. Patient elects to proceed.   Jerilee Field 05/19/2023, 9:19 AM

## 2023-05-19 NOTE — Op Note (Signed)
Preoperative diagnosis: BPH, weak stream, gross hematuria Post operative diagnosis: Same  Procedure: TURP residual  Surgeon: Mena Goes  Anesthesia: General  Indication for procedure: Eric Stokes is a 77 year old male with a history of BPH and thulium laser vaporization of the prostate.  He then underwent radiation for prostate cancer.  The prostatic urethra stuck together and he went into retention and the channel could not be found.  He underwent an SP tube and then a TURP.  He did well and the SP tube was removed.  He has since gotten some dystrophic calcification and necrotic tissue and narrowing of the channel.  He was brought today for TURP residual.  Findings: On cystoscopy the urethra was unremarkable, prostatic urethra revealed some sticky prostatic tissue fusing in the midline and some dystrophic calcification just inside the bladder neck and the prostatic tissue.  The bladder neck was widely patent and pink and viable.  Ureteral orifice ease were identified, bladder was moderately trabeculated.  No stone or foreign body in the bladder.  No mucosal lesions.  Description of procedure: After consent was obtained patient brought to the operating room.  After adequate anesthesia he placed in lithotomy position and prepped and draped in the usual sterile fashion.  Timeout was performed to confirm the patient and procedure.  The meatus was tight dilated to 30 [place] then the 26 [place] resectoscope continuous-flow sheath was passed with visual obturator and the bladder inspected.  The loop and the handle were then passed.  The ureteral orifice ease were identified and I came just inside the bladder neck and resected at the patient's left 5:00 and brought that down to the verumontanum to open up the channel.  I then went anterior to posterior just inside the bladder neck and the prostatic urethra and resected the left lateral lobe down to the verumontanum.  Similar resection was done on the right side.  This  created an excellent channel.  Again the bladder neck was widely patent and not resected.  Prostatic urethra was where the necrotic tissue and dystrophic calcifications were more on the right side up near the bladder neck than the left.  The tissue in the mid prostatic urethra was sticky and friable and was resected and separated.  At the end of the resection there was an excellent channel.  There is still some tissue to resect.  I also left some apical tissue purposefully.  Again the bladder neck was not resected no resection out past the verumontanum.  All the chips were evacuated.  Hemostasis was excellent at low pressure.  The scope was backed out a 20 [place] two-way catheter was placed.  This was left to gravity drainage with 21 cc in the balloon.  Drainage and irrigation was clear.  He was awakened taken the cover room in stable condition.  Complications: None  Blood loss: Minimal  Specimens to pathology: TURP chips  Drains: 20 French Foley catheter  Disposition: Patient stable to PACU.

## 2023-05-19 NOTE — Transfer of Care (Signed)
Immediate Anesthesia Transfer of Care Note  Patient: Eric Stokes  Procedure(s) Performed: Procedure(s): TRANSURETHRAL RESECTION OF THE PROSTATE (TURP) (N/A)  Patient Location: PACU  Anesthesia Type:General  Level of Consciousness: Patient easily awoken, sedated, comfortable, cooperative, following commands, responds to stimulation.   Airway & Oxygen Therapy: Patient spontaneously breathing, ventilating well, oxygen via simple oxygen mask.  Post-op Assessment: Report given to PACU RN, vital signs reviewed and stable, moving all extremities.   Post vital signs: Reviewed and stable.  Complications: No apparent anesthesia complications  Last Vitals:  Vitals Value Taken Time  BP 122/76 05/19/23 1104  Temp    Pulse 67 05/19/23 1107  Resp 25 05/19/23 1107  SpO2 99 % 05/19/23 1107  Vitals shown include unfiled device data.  Last Pain:  Vitals:   05/19/23 0911  TempSrc: Oral  PainSc: 2       Patients Stated Pain Goal: 2 (05/19/23 0911)  Complications: No notable events documented.

## 2023-05-19 NOTE — Anesthesia Procedure Notes (Signed)
Procedure Name: LMA Insertion Date/Time: 05/19/2023 10:05 AM  Performed by: Ludwig Lean, CRNAPre-anesthesia Checklist: Patient identified, Emergency Drugs available, Suction available and Patient being monitored Patient Re-evaluated:Patient Re-evaluated prior to induction Oxygen Delivery Method: Circle system utilized Preoxygenation: Pre-oxygenation with 100% oxygen Induction Type: IV induction Ventilation: Mask ventilation without difficulty LMA: LMA inserted LMA Size: 4.0 Number of attempts: 1 Placement Confirmation: positive ETCO2 and breath sounds checked- equal and bilateral Tube secured with: Tape Dental Injury: Teeth and Oropharynx as per pre-operative assessment

## 2023-05-20 ENCOUNTER — Encounter (HOSPITAL_COMMUNITY): Payer: Self-pay | Admitting: Urology

## 2023-05-20 LAB — SURGICAL PATHOLOGY

## 2023-09-03 ENCOUNTER — Other Ambulatory Visit (HOSPITAL_BASED_OUTPATIENT_CLINIC_OR_DEPARTMENT_OTHER): Payer: Self-pay

## 2023-09-03 MED ORDER — CEPHALEXIN 500 MG PO CAPS
500.0000 mg | ORAL_CAPSULE | Freq: Two times a day (BID) | ORAL | 0 refills | Status: DC
  Filled 2023-09-03: qty 10, 5d supply, fill #0

## 2023-09-30 ENCOUNTER — Ambulatory Visit (INDEPENDENT_AMBULATORY_CARE_PROVIDER_SITE_OTHER): Payer: Medicare Other | Admitting: Psychology

## 2023-09-30 DIAGNOSIS — F4323 Adjustment disorder with mixed anxiety and depressed mood: Secondary | ICD-10-CM

## 2023-09-30 NOTE — Progress Notes (Signed)
[facility] Counselor Initial Adult Exam  Name: Eric Stokes Date: 09/30/2023 MRN: [id number] DOB: October 14, 1945 PCP: Tessie Eke, PA  Time spent: 2:00pm-2:55pm   55 minutes  Guardian/Payee:  n/a    Paperwork requested: No   Reason for Visit /Presenting Problem: Pt present for initial assessment in person.  Pt came to therapy bc he is struggling with symptoms of anxiety and depression related to life stressors.  Pt states his wife is getting dementia and is getting testing for a definitive diagnosis.   Pt feels frustrated at times with her care.  Pt helps her with her medications.  She often repeats herself in conversations.    Pt's good friend died this past year and had dementia.    Pt has health issues and anxiety.   Pt has a lot of medical issues.   Pt had quadrupal bypass in 2021.  Pt has prostate cancer and had radiation and needs additional surgeries.   Pt has afib and pt wonders if anxiety is a contributer.  Pt is a Product/process development scientist. Pt has sleep issues.  Pt uses a CPAP but does not sleep well.   Pt has nightmares.  Pt is in bed for 10-12 hours but does not sleep well.  He tosses and turns.  At times pt feels overwhelmed with household tasks and repairs.  Pt is constantly doing things and staying busy.  Mental Status Exam: Appearance:   Casual     Behavior:  Appropriate  Motor:  Normal  Speech/Language:   Normal Rate  Affect:  Appropriate  Mood:  normal  Thought process:  normal  Thought content:    WNL  Sensory/Perceptual disturbances:    WNL  Orientation:  oriented to person, place, time/date, and situation  Attention:  Good  Concentration:  Good  Memory:  WNL  Fund of knowledge:   Good  Insight:    Good  Judgment:   Good  Impulse Control:  Good    Reported Symptoms:  anxiety, sadness  Risk Assessment: Danger to Self:  No Self-injurious Behavior: No Danger to Others: No Duty to Warn:no Physical Aggression / Violence:No  Access to Firearms a  concern: No  Gang Involvement:No  Patient / guardian was educated about steps to take if suicide or homicide risk level increases between visits: n/a While future psychiatric events cannot be accurately predicted, the patient does not currently require acute inpatient psychiatric care and does not currently meet [facility] involuntary commitment criteria.  Substance Abuse History: Current substance abuse: Yes   Pt states he is drinking too much beer every day.   Pt drinks about 2 beers a day on average.   He drinks a 6 - 12 pack a week.   Pt knows alcohol is not good for his health.   Past Psychiatric History:   Previous psychological history is significant for anxiety and depression Outpatient Providers:pt has been in therapy in the past.  History of Psych Hospitalization: No  Psychological Testing:  n/a    Abuse History:  Victim of: No.,  n/a    Report needed: No. Victim of Neglect:No. Perpetrator of  n/a   Witness / Exposure to Domestic Violence: No   Protective Services Involvement: No  Witness to [employer] Violence:  No   Family History:  Family History  Problem Relation Age of Onset   Diabetes Mother    COPD Father    Stroke Paternal Grandfather     Living situation: the patient lives with his  wife.  Pt grew up with his father and sister.  Pt's mother had schizophrenia and was in a mental institution.   Pt states he had a decent childhood.   No childhood abuse.   Family history of substance abuse.   Family history of mental health issues.   Father had depression and anxiety.  Mother was schizophrenic.    Sexual Orientation: Straight  Relationship Status: married for 57 years.  Name of spouse / other:Eric Stokes  If a parent, number of children / ages:2 adult daughters.   3 grandchildren.   Support Systems: oldest daughter Shaune Pollack who is a Publishing rights manager.   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    Military Service: Yes   Educational History: Education: some college  Religion/Sprituality/World View: No religious affiliation.   Any cultural differences that may affect / interfere with treatment:  not applicable   Recreation/Hobbies: gardening  Stressors: Health problems   Loss of friend.   Other: sleep difficulties    Strengths: Supportive Relationships, Hopefulness, Self Advocate, and Able to Communicate Effectively  Barriers:  none   Legal History: Pending legal issue / charges: The patient has no significant history of legal issues. History of legal issue / charges:  n/a  Medical History/Surgical History: reviewed Past Medical History:  Diagnosis Date   Aortic stenosis, mild    last echo in epic 05-27-2020  ef 40-45%,  mean grandiant , valve area 2.46 cm^2 ,  mild regurg   BPH with obstruction/lower urinary tract symptoms    urologist--- dr eskridge   Carotid bruit present 06/15/2018   2015 Carotid Dopplers:  Essentially normal carotid arteries, with very slight hard plaque in the proximal ICA's and serpentine distal vessels. 1-39% bilateral ICA stenosis. Patent vertebral arteries with antegrade flow. Normal subclavian arteries, bilaterally.   Chronic systolic CHF (congestive heart failure) (HCC) 05/2020   followed by dr Jeanelle Malling   COPD (chronic obstructive pulmonary disease) (HCC)    Coronary artery disease 04/2020   cardiologist--- dr Gala Romney;   NSTEMI /  Afib w/ RVR/  Acute CHF/  cardiogenic shock 04-20-2020;  cath 05-17-2020 multivessel disease w/ critical LM;   05-18-2020 s/p CABG x4 and Maze procedure;  nuclear stress study done [facility] 09-17-2021 no ischemia, ef 54%   Essential hypertension, benign 12/21/2008   Qualifier: Diagnosis of  By: Antoine Poche, MD, Gerrit Heck     Glaucoma, both eyes    History of migraine    History of non-ST elevation myocardial infarction (NSTEMI) 05/17/2020   cardiogenic shock,  AF w/ RVR, acute  CHF,  ICM;   s/p CABG w/ MAZE   History of squamous cell carcinoma of skin    History of TIA (transient ischemic attack) 02/28/2019   questionable TIA  per neurology note dr Pearlean Brownie, 04-12-2019, likely migraine   Hx of colonic polyps 01/09/2020   Hyperlipidemia    Ischemic cardiomyopathy 04/2020   per cath 09-03-20212  ef 25-30%;   last echo in epic 05-27-2020  ef 40-45%   Malignant neoplasm prostate (HCC) 10/2019   urologist--- dr Mena Goes;  dx 03/ 2021  active survillance   OA (osteoarthritis)    OSA on CPAP    Diagnosed at the [facility] 2016    PAF (paroxysmal atrial fibrillation) (HCC) 04/2020   followed by cardiology---  s/p MAZE procedure 05-18-2020, takes ASA   PVC's (premature ventricular contractions)    S/P CABG x 4 05/18/2020  LIMA to LAD,  RIMA to PDA, SVG to OM1 and OM2   Seasonal allergic rhinitis    Sinus bradycardia    event monitor 01-29-2017  mild bradycardia, no pauses, rare PVCs   Vitamin B 12 deficiency    Vitamin D deficiency 11/26/2010   Wears hearing aid in both ears     Past Surgical History:  Procedure Laterality Date   CATARACT EXTRACTION W/ INTRAOCULAR LENS IMPLANT Bilateral    CLIPPING OF ATRIAL APPENDAGE Left 05/18/2020   Procedure: CLIPPING OF ATRIAL APPENDAGE USING ATRICURE 45 MM ATRICLIP;  Surgeon: Linden Dolin, MD;  Location: [facility] OR;  Service: Open Heart Surgery;  Laterality: Left;   COLONOSCOPY     per pt last one approx 2019  by dr Loreta Ave   CORONARY ARTERY BYPASS GRAFT N/A 05/18/2020   Procedure: CORONARY ARTERY BYPASS GRAFTING (CABG) TIMES FOUR USING INTERNAL BILATERAL MAMMARY ARTERIES AND HARVESTED LEFT RADIAL ARTERY.;  Surgeon: Linden Dolin, MD;  Location: [facility] OR;  Service: Open Heart Surgery;  Laterality: N/A;  CORONARY ENDARTERECTOMY PERFORMED DURING SURGERY    CYSTOSCOPY WITH URETHRAL DILATATION N/A 10/28/2022   Procedure: CYSTOSCOPY WITH BLADDER NECK DILATATION;  Surgeon: Jerilee Field, MD;  Location: [facility];  Service:  Urology;  Laterality: N/A;  75 MINS   GOLD SEED IMPLANT N/A 08/26/2022   Procedure: GOLD SEED IMPLANT;  Surgeon: Rene Paci, MD;  Location: [facility];  Service: Urology;  Laterality: N/A;   IABP INSERTION N/A 05/17/2020   Procedure: IABP INSERTION;  Surgeon: Dolores Patty, MD;  Location: [facility];  Service: Cardiovascular;  Laterality: N/A;   INSERTION OF SUPRAPUBIC CATHETER N/A 10/10/2022   Procedure: INSERTION OF SUPRAPUBIC CATHETER cystoscopy urethral dilation;  Surgeon: Despina Arias, MD;  Location: [facility];  Service: Urology;  Laterality: N/A;   KNEE ARTHROPLASTY Right 03/14/2020   Procedure: COMPUTER ASSISTED TOTAL KNEE ARTHROPLASTY;  Surgeon: Samson Frederic, MD;  Location: [facility];  Service: Orthopedics;  Laterality: Right;   LAPAROSCOPIC CHOLECYSTECTOMY  02/26/2000   @[facility]   by dr Jamey Ripa   MAZE N/A 05/18/2020   Procedure: MAZE USING ATRICURE ISOLATOR CLAMP OLL2;  Surgeon: Linden Dolin, MD;  Location: [facility];  Service: Open Heart Surgery;  Laterality: N/A;   RADIAL ARTERY HARVEST Left 05/18/2020   Procedure: RADIAL ARTERY HARVEST;  Surgeon: Linden Dolin, MD;  Location: [facility];  Service: Open Heart Surgery;  Laterality: Left;   RIGHT HEART CATH N/A 05/17/2020   Procedure: RIGHT HEART CATH;  Surgeon: Dolores Patty, MD;  [facility];  Service: Cardiovascular;  Laterality: N/A;   RIGHT/LEFT HEART CATH AND CORONARY ANGIOGRAPHY N/A 05/17/2020   Procedure: RIGHT/LEFT HEART CATH AND CORONARY ANGIOGRAPHY;  Surgeon: Runell Gess, MD;  [facility]: [facility];  Service: Cardiovascular;  Laterality: N/A;   SPACE OAR INSTILLATION N/A 08/26/2022   Procedure: SPACE OAR INSTILLATION;  Surgeon: Rene Paci, MD;  Location[facility]  Service: Urology;  Laterality: N/A;  30 MINS FOR CASE   TEE WITHOUT CARDIOVERSION N/A 05/18/2020   Procedure: TRANSESOPHAGEAL ECHOCARDIOGRAM (TEE);   Surgeon: Linden Dolin, MD;  Location: [facility];  Service: Open Heart Surgery;  Laterality: N/A;   THULIUM LASER TURP (TRANSURETHRAL RESECTION OF PROSTATE) N/A 04/15/2022   Procedure: Morton Peters LASER VAPORIZATION OF PROSTATE;  Surgeon: Jerilee Field, MD;  Location: [facility];  Service: Urology;  Laterality: N/A;  90 MINS FOR CASE   TONSILLECTOMY AND  ADENOIDECTOMY  1952   TRANSURETHRAL RESECTION OF PROSTATE N/A 04/15/2022   TRANSURETHRAL RESECTION OF PROSTATE  10/28/2022   Procedure: TRANSURETHRAL RESECTION OF THE PROSTATE (TURP);  Surgeon: Jerilee Field, MD;  Location: [facility];  Service: Urology;;   TRANSURETHRAL RESECTION OF PROSTATE N/A 05/19/2023   Procedure: TRANSURETHRAL RESECTION OF THE PROSTATE (TURP);  Surgeon: Jerilee Field, MD;  Location: [facility];  Service: Urology;  Laterality: N/A;    Medications: Current Outpatient Medications  Medication Sig Dispense Refill   acetaminophen (TYLENOL) 500 MG tablet Take 500-1,000 mg by mouth every 6 (six) hours as needed (pain.).     albuterol (VENTOLIN HFA) 108 (90 Base) MCG/ACT inhaler Inhale 1 puff into the lungs as needed for wheezing or shortness of breath.     alfuzosin (UROXATRAL) 10 MG 24 hr tablet Take 10 mg by mouth at bedtime.     aspirin 81 MG EC tablet Take 81 mg by mouth at bedtime. (Patient not taking: Reported on 05/15/2023)     cephALEXin (KEFLEX) 500 MG capsule Take 1 capsule (500 mg total) by mouth 2 (two) times daily. 10 capsule 0   cetirizine (ZYRTEC) 10 MG tablet Take 10 mg by mouth at bedtime.     fluticasone (FLONASE) 50 MCG/ACT nasal spray USE TWO SPRAYS IN EACH NOSTRIL DAILY AS NEEDED. 48 mL 0   ketotifen (ZADITOR) 0.025 % ophthalmic solution Place 1 drop into both eyes daily as needed (allergies).     latanoprost (XALATAN) 0.005 % ophthalmic solution INSTILL 1 DROP INTO BOTH EYES AT BEDTIME (Patient taking differently: Place 1 drop into both eyes in the morning.) 11.25 mL 1   losartan (COZAAR) 25  MG tablet TAKE 0.5 TABLETS BY MOUTH AT BEDTIME. 15 tablet 11   montelukast (SINGULAIR) 10 MG tablet TAKE 1 TABLET BY MOUTH EVERY DAY AT BEDTIME 90 tablet 3   NON FORMULARY Cpap at night     rosuvastatin (CRESTOR) 20 MG tablet Take 1 tablet (20 mg total) by mouth daily. (Patient taking differently: Take 20 mg by mouth at bedtime.) 90 tablet 3   sulfamethoxazole-trimethoprim (BACTRIM DS) 800-160 MG tablet Take 1 tablet by mouth at bedtime.     No current facility-administered medications for this visit.    Allergies  Allergen Reactions   Flomax [Tamsulosin] Other (See Comments)    dizziness   Cipro [Ciprofloxacin Hcl] Swelling    Knee swelling, joint pain   Lipitor [Atorvastatin] Other (See Comments)    Leg Cramps   Lisinopril Cough   Spironolactone Other (See Comments)    Grew breasts     Diagnoses:  F43.23  Plan of Care: Recommend ongoing therapy.  Pt participated in setting treatment goals.  Pt wants to improve coping skills and feel better so he can help his wife.  Pt would like to enjoy life more.   Plan to meet every two weeks.   Pt is in agreement with treatment plan.    Treatment Plan Client Abilities/Strengths  Pt is bright, engaging, and motivated for therapy.  Client Treatment Preferences  Individual therapy.  Client Statement of Needs  Improve copings skills. Symptoms  Depressed or irritable mood. Excessive and/or unrealistic worry that is difficult to control occurring more days than not for at least 6 months about a number of events or activities. Hypervigilance (e.g., feeling constantly on edge, experiencing concentration difficulties, having trouble falling or staying asleep, exhibiting a general state of irritability).  Problems Addressed  Unipolar Depression, Anxiety Goals 1. Alleviate depressive symptoms and return  to previous level of effective functioning. 2. Appropriately grieve the loss in order to normalize mood and to return to previously adaptive level  of functioning. Objective Learn and implement behavioral strategies to overcome depression. Target Date: 2024-09-29 Frequency: Biweekly  Progress: 10 Modality: individual  Related Interventions Assist the client in developing skills that increase the likelihood of deriving pleasure from behavioral activation (e.g., assertiveness skills, developing an exercise plan, less internal/more external focus, increased social involvement); reinforce success. Engage the client in "behavioral activation," increasing his/her activity level and contact with sources of reward, while identifying processes that inhibit activation. use behavioral techniques such as instruction, rehearsal, role-playing, role reversal, as needed, to facilitate activity in the client's daily life; reinforce success. 3. Develop healthy interpersonal relationships that lead to the alleviation and help prevent the relapse of depression. 4. Develop healthy thinking patterns and beliefs about self, others, and the world that lead to the alleviation and help prevent the relapse of depression. 5. Enhance ability to effectively cope with the full variety of life's worries and anxieties. 6. Learn and implement coping skills that result in a reduction of anxiety and worry, and improved daily functioning. Objective Learn and implement problem-solving strategies for realistically addressing worries. Target Date: 2024-09-29 Frequency: Biweekly  Progress: 10 Modality: individual  Related Interventions Assign the client a homework exercise in which he/she problem-solves a current problem (see Mastery of Your Anxiety and Worry: Workbook by Elenora Fender and Filbert Schilder or Generalized Anxiety Disorder by Elesa Hacker, and Filbert Schilder); review, reinforce success, and provide corrective feedback toward improvement. Teach the client problem-solving strategies involving specifically defining a problem, generating options for addressing it, evaluating the pros and cons of  each option, selecting and implementing an optional action, and reevaluating and refining the action. Objective Learn and implement calming skills to reduce overall anxiety and manage anxiety symptoms. Target Date: 2024-09-29 Frequency: Biweekly  Progress: 10 Modality: individual  Related Interventions Assign the client to read about progressive muscle relaxation and other calming strategies in relevant books or treatment manuals (e.g., Progressive Relaxation Training by Twana First; Mastery of Your Anxiety and Worry: Workbook by Earlie Counts). Assign the client homework each session in which he/she practices relaxation exercises daily, gradually applying them progressively from non-anxiety-provoking to anxiety-provoking situations; review and reinforce success while providing corrective feedback toward improvement. Teach the client calming/relaxation skills (e.g., applied relaxation, progressive muscle relaxation, cue controlled relaxation; mindful breathing; biofeedback) and how to discriminate better between relaxation and tension; teach the client how to apply these skills to his/her daily life. 7. Recognize, accept, and cope with feelings of depression. 8. Reduce overall frequency, intensity, and duration of the anxiety so that daily functioning is not impaired. 9. Resolve the core conflict that is the source of anxiety. 10. Stabilize anxiety level while increasing ability to function on a daily basis. Diagnosis F43.23   Conditions For Discharge Achievement of treatment goals and objectives    Salomon Fick, LCSW

## 2023-10-15 ENCOUNTER — Ambulatory Visit (INDEPENDENT_AMBULATORY_CARE_PROVIDER_SITE_OTHER): Payer: Medicare Other | Admitting: Psychology

## 2023-10-15 DIAGNOSIS — F4323 Adjustment disorder with mixed anxiety and depressed mood: Secondary | ICD-10-CM

## 2023-10-15 NOTE — Progress Notes (Signed)
 [facility] Counselor/Therapist Progress Note  Patient ID: Eric Stokes, MRN: [id number],    Date: 10/15/2023  Time Spent: 2:00pm-2:55pm   55 minutes   Treatment Type: Individual Therapy  Reported Symptoms: stress, depression  Mental Status Exam: Appearance:  Casual     Behavior: Appropriate  Motor: Normal  Speech/Language:  Normal Rate  Affect: Appropriate  Mood: normal  Thought process: normal  Thought content:   WNL  Sensory/Perceptual disturbances:   WNL  Orientation: oriented to person, place, time/date, and situation  Attention: Good  Concentration: Good  Memory: WNL  Fund of knowledge:  Good  Insight:   Good  Judgment:  Good  Impulse Control: Good   Risk Assessment: Danger to Self:  No Self-injurious Behavior: No Danger to Others: No Duty to Warn:no Physical Aggression / Violence:No  Access to Firearms a concern: No  Gang Involvement:No   Subjective: Pt present for face-to-face individual therapy via video.  Pt consents to telehealth video session and is aware of limitations and benefits of virtual sessions.   Location of pt: home Location of therapist: home office.   Pt completed his sleep diaries.  Reviewed the data and calculated that pt's average time in bed in 9.7 hours and his average sleep time is 8.7 hours.   Pt has about 3-4 middle of the night awakenings to go to the bathroom but gets back to sleep within 5-10 minutes.   On average pt goes to bed at about 9:00pm and gets up at about 7am-8am.   Despite getting what seems like an adequate amount of sleep pt tends to not feel rested in the morning.  He states his CPAP machine is working and he does not think he is having sleep apnea events.   At times pt feels sleepy during the day and takes a nap for 1-2 hours.    Addressed other sources of pt's tiredness and fatigue.   Identified that pt's stress and health issues could be impacting his feelings of tiredness as well as symptoms of  depression.  Pt has also had some bad dreams.   The theme of his dreams tends to be loss of control of things.  Pt states he feels a loss of control in life.  He use to be very organized but now has been disorganized for the past year.   Pt has also had several health issues and many surgeries.  He agonizes over his health issues.  Pt feels stress with care giving for his wife.  He feels like he has to "babysit" her bc of her cognitive/memory deficits.   Worked on self care strategies. Provided supportive therapy.   Interventions: Cognitive Behavioral Therapy and Insight-Oriented  Diagnosis:Adjustment disorder with mixed anxiety and depressed mood  Plan of Care: Recommend ongoing therapy.  Pt participated in setting treatment goals.  Pt wants to improve coping skills and feel better so he can help his wife.  Pt would like to enjoy life more.   Plan to meet every two weeks.   Pt is in agreement with treatment plan.    Treatment Plan Client Abilities/Strengths  Pt is bright, engaging, and motivated for therapy.  Client Treatment Preferences  Individual therapy.  Client Statement of Needs  Improve copings skills. Symptoms  Depressed or irritable mood. Excessive and/or unrealistic worry that is difficult to control occurring more days than not for at least 6 months about a number of events or activities. Hypervigilance (e.g., feeling constantly on edge, experiencing concentration difficulties,  having trouble falling or staying asleep, exhibiting a general state of irritability).  Problems Addressed  Unipolar Depression, Anxiety Goals 1. Alleviate depressive symptoms and return to previous level of effective functioning. 2. Appropriately grieve the loss in order to normalize mood and to return to previously adaptive level of functioning. Objective Learn and implement behavioral strategies to overcome depression. Target Date: 2024-09-29 Frequency: Biweekly  Progress: 10 Modality: individual   Related Interventions Assist the client in developing skills that increase the likelihood of deriving pleasure from behavioral activation (e.g., assertiveness skills, developing an exercise plan, less internal/more external focus, increased social involvement); reinforce success. Engage the client in "behavioral activation," increasing his/her activity level and contact with sources of reward, while identifying processes that inhibit activation. use behavioral techniques such as instruction, rehearsal, role-playing, role reversal, as needed, to facilitate activity in the client's daily life; reinforce success. 3. Develop healthy interpersonal relationships that lead to the alleviation and help prevent the relapse of depression. 4. Develop healthy thinking patterns and beliefs about self, others, and the world that lead to the alleviation and help prevent the relapse of depression. 5. Enhance ability to effectively cope with the full variety of life's worries and anxieties. 6. Learn and implement coping skills that result in a reduction of anxiety and worry, and improved daily functioning. Objective Learn and implement problem-solving strategies for realistically addressing worries. Target Date: 2024-09-29 Frequency: Biweekly  Progress: 10 Modality: individual  Related Interventions Assign the client a homework exercise in which he/she problem-solves a current problem (see Mastery of Your Anxiety and Worry: Workbook by Elenora Fender and Filbert Schilder or Generalized Anxiety Disorder by Elesa Hacker, and Filbert Schilder); review, reinforce success, and provide corrective feedback toward improvement. Teach the client problem-solving strategies involving specifically defining a problem, generating options for addressing it, evaluating the pros and cons of each option, selecting and implementing an optional action, and reevaluating and refining the action. Objective Learn and implement calming skills to reduce overall anxiety  and manage anxiety symptoms. Target Date: 2024-09-29 Frequency: Biweekly  Progress: 10 Modality: individual  Related Interventions Assign the client to read about progressive muscle relaxation and other calming strategies in relevant books or treatment manuals (e.g., Progressive Relaxation Training by Twana First; Mastery of Your Anxiety and Worry: Workbook by Earlie Counts). Assign the client homework each session in which he/she practices relaxation exercises daily, gradually applying them progressively from non-anxiety-provoking to anxiety-provoking situations; review and reinforce success while providing corrective feedback toward improvement. Teach the client calming/relaxation skills (e.g., applied relaxation, progressive muscle relaxation, cue controlled relaxation; mindful breathing; biofeedback) and how to discriminate better between relaxation and tension; teach the client how to apply these skills to his/her daily life. 7. Recognize, accept, and cope with feelings of depression. 8. Reduce overall frequency, intensity, and duration of the anxiety so that daily functioning is not impaired. 9. Resolve the core conflict that is the source of anxiety. 10. Stabilize anxiety level while increasing ability to function on a daily basis. Diagnosis F43.23   Conditions For Discharge Achievement of treatment goals and objectives   Salomon Fick, LCSW

## 2023-10-28 ENCOUNTER — Telehealth (HOSPITAL_COMMUNITY): Payer: Self-pay

## 2023-10-28 NOTE — Telephone Encounter (Signed)
 Called to confirm/remind patient of their appointment at the [facility] on 10/29/23.   Patient reminded to bring all medications and/or complete list.  Confirmed patient has transportation. Gave directions, instructed to utilize valet parking.  Confirmed appointment prior to ending call.

## 2023-10-29 ENCOUNTER — Encounter (HOSPITAL_COMMUNITY): Payer: Self-pay

## 2023-10-29 ENCOUNTER — Ambulatory Visit (HOSPITAL_COMMUNITY)
Admission: RE | Admit: 2023-10-29 | Discharge: 2023-10-29 | Disposition: A | Discharge status: Home / Self Care | Payer: Medicare Other | Source: Ambulatory Visit | Attending: Physician Assistant | Admitting: Physician Assistant

## 2023-10-29 VITALS — BP 122/60 | HR 95 | Ht 68.5 in | Wt 243.4 lb

## 2023-10-29 DIAGNOSIS — J4489 Other specified chronic obstructive pulmonary disease: Secondary | ICD-10-CM | POA: Diagnosis not present

## 2023-10-29 DIAGNOSIS — Z7901 Long term (current) use of anticoagulants: Secondary | ICD-10-CM | POA: Insufficient documentation

## 2023-10-29 DIAGNOSIS — Z951 Presence of aortocoronary bypass graft: Secondary | ICD-10-CM | POA: Diagnosis not present

## 2023-10-29 DIAGNOSIS — Z87440 Personal history of urinary (tract) infections: Secondary | ICD-10-CM | POA: Insufficient documentation

## 2023-10-29 DIAGNOSIS — I480 Paroxysmal atrial fibrillation: Secondary | ICD-10-CM | POA: Diagnosis not present

## 2023-10-29 DIAGNOSIS — I252 Old myocardial infarction: Secondary | ICD-10-CM | POA: Insufficient documentation

## 2023-10-29 DIAGNOSIS — G4733 Obstructive sleep apnea (adult) (pediatric): Secondary | ICD-10-CM | POA: Diagnosis not present

## 2023-10-29 DIAGNOSIS — I110 Hypertensive heart disease with heart failure: Secondary | ICD-10-CM | POA: Insufficient documentation

## 2023-10-29 DIAGNOSIS — E669 Obesity, unspecified: Secondary | ICD-10-CM | POA: Diagnosis not present

## 2023-10-29 DIAGNOSIS — Z79899 Other long term (current) drug therapy: Secondary | ICD-10-CM | POA: Diagnosis not present

## 2023-10-29 DIAGNOSIS — I255 Ischemic cardiomyopathy: Secondary | ICD-10-CM | POA: Diagnosis not present

## 2023-10-29 DIAGNOSIS — Z87891 Personal history of nicotine dependence: Secondary | ICD-10-CM | POA: Insufficient documentation

## 2023-10-29 DIAGNOSIS — I2510 Atherosclerotic heart disease of native coronary artery without angina pectoris: Secondary | ICD-10-CM | POA: Diagnosis not present

## 2023-10-29 DIAGNOSIS — Z8546 Personal history of malignant neoplasm of prostate: Secondary | ICD-10-CM | POA: Insufficient documentation

## 2023-10-29 DIAGNOSIS — I5022 Chronic systolic (congestive) heart failure: Secondary | ICD-10-CM | POA: Diagnosis present

## 2023-10-29 DIAGNOSIS — I350 Nonrheumatic aortic (valve) stenosis: Secondary | ICD-10-CM | POA: Diagnosis not present

## 2023-10-29 NOTE — Progress Notes (Signed)
 [facility] Note    PCP: Tessie Eke, PA HF Cardiologist: Dr. Gala Romney    HPI  Eric Stokes is a 78 y.o.with obesity, HTN, OSA on CPAP, HL, prostate CA, ? Asthma vs COPD, PAF, CAD s/p CABG 05/18/20 and systolic HF.   Echo in 2020 that showed EF 60-65% with normal RV.    Presented to [facility] via EMS with chest pain and shortness of breath. Arrived in the ED was in A fib RVR and had ST depression. HS Trop [id number]. Cath showed severe multivessel disease with elevated filling pressures. Echo EF 25-30% with mild AS. IABP placed. Underwent CABG 05/18/20 with Maze and LAA clipping. LIMA -> LAD, RIMA  -> PDA. Left radial OM1-> OM2. Post CABG course was slow but relatively uneventful. Repeat echo 10/10 and showed EF improved 40-45% normal RV. Discharged home on 05/30/20.   Several med intolerances. Bisoprolol stopped due to low BP. Developed gynecomastia w/ spiro and started on eplerenone. Marcelline Deist stopped due recurrent UTIs. Off Eliquis due to hematuria.  N[facility] stress test at [facility] (2/23) showed no ischemia, EF 54%.  Follow up 2/23, had recent exertional dyspnea. Had a few low BP readings w/ dizziness. No med changes.  S/p TURP 8/23.  Seen in ED 04/24/22 with dizziness and SOB. Found to have UTI. D dimer +, CTA negative. Treated with IV ceftriaxone.   He stopped his Inspra on his own due to frequent urination with resolution of symptoms.    S/p gold seed implant at [facility] (1/24), no anesthesia complications.   S/p insertion of suprapubic catheter (2/24), no anesthesia complications. S/p cystoscopy with bladder neck dilation, TURP 3/24.  Echo 7/24 EF 50%, moderate LVH, G2DD, RV ok, moderate TR, AoV mean gradient increased 11--> 28 mmHg  Underwent TURP residual 05/19/23. No anesthesia complications.  14 day holter at [facility] 12/24: SR with 1% Afib burden (longest episode 10 min), 10.5% PACs, 6% PVCs  He is here today for CHF follow-up. His Cardiologist through the [place] recently  recommended he restart Eliquis and add metoprolol xl d/t presence of Afib on monitor. Has not started medications yet. Episodes detected on monitor were symptomatic. Notes occasional chest tightness and palpitations when under stress. Has been caring for his wife with dementia. Notes dyspnea with heavier exertion which is chronic and has been stable.  No orthopnea or PND. No lower extremity edema. No major bleeding issues.  Previously worked in [employer] representative for 43 years.   Past Medical History:  Diagnosis Date   Aortic stenosis, mild    last echo in epic 05-27-2020  ef 40-45%,  mean grandiant , valve area 2.46 cm^2 ,  mild regurg   BPH with obstruction/lower urinary tract symptoms    urologist--- dr Mena Goes   Carotid bruit present 06/15/2018   2015 Carotid Dopplers:  Essentially normal carotid arteries, with very slight hard plaque in the proximal ICA's and serpentine distal vessels. 1-39% bilateral ICA stenosis. Patent vertebral arteries with antegrade flow. Normal subclavian arteries, bilaterally.   Chronic systolic CHF (congestive heart failure) (HCC) 05/2020   followed by dr Jeanelle Malling   COPD (chronic obstructive pulmonary disease) (HCC)    Coronary artery disease 04/2020   cardiologist--- dr Gala Romney;   NSTEMI /  Afib w/ RVR/  Acute CHF/  cardiogenic shock 04-20-2020;  cath 05-17-2020 multivessel disease w/ critical LM;   05-18-2020 s/p CABG x4 and Maze procedure;  nuclear stress study done [facility] 09-17-2021 no ischemia, ef 54%   Essential hypertension, benign 12/21/2008  Qualifier: Diagnosis of  By: Antoine Poche, MD, Gerrit Heck     Glaucoma, both eyes    History of migraine    History of non-ST elevation myocardial infarction (NSTEMI) 05/17/2020   cardiogenic shock,  AF w/ RVR, acute CHF,  ICM;   s/p CABG w/ MAZE   History of squamous cell carcinoma of skin    History of TIA (transient ischemic attack) 02/28/2019   questionable TIA  per neurology note dr Pearlean Brownie, 04-12-2019, likely  migraine   Hx of colonic polyps 01/09/2020   Hyperlipidemia    Ischemic cardiomyopathy 04/2020   per cath 09-03-20212  ef 25-30%;   last echo in epic 05-27-2020  ef 40-45%   Malignant neoplasm prostate (HCC) 10/2019   urologist--- dr Mena Goes;  dx 03/ 2021  active survillance   OA (osteoarthritis)    OSA on CPAP    Diagnosed at the [facility] 2016    PAF (paroxysmal atrial fibrillation) (HCC) 04/2020   followed by cardiology---  s/p MAZE procedure 05-18-2020, takes ASA   PVC's (premature ventricular contractions)    S/P CABG x 4 05/18/2020   LIMA to LAD,  RIMA to PDA, SVG to OM1 and OM2   Seasonal allergic rhinitis    Sinus bradycardia    event monitor 01-29-2017  mild bradycardia, no pauses, rare PVCs   Vitamin B 12 deficiency    Vitamin D deficiency 11/26/2010   Wears hearing aid in both ears     Current Outpatient Medications  Medication Sig Dispense Refill   acetaminophen (TYLENOL) 500 MG tablet Take 500-1,000 mg by mouth every 6 (six) hours as needed (pain.).     albuterol (VENTOLIN HFA) 108 (90 Base) MCG/ACT inhaler Inhale 1 puff into the lungs as needed for wheezing or shortness of breath.     alfuzosin (UROXATRAL) 10 MG 24 hr tablet Take 10 mg by mouth at bedtime.     aspirin 81 MG EC tablet Take 81 mg by mouth at bedtime. (Patient not taking: Reported on 05/15/2023)     cephALEXin (KEFLEX) 500 MG capsule Take 1 capsule (500 mg total) by mouth 2 (two) times daily. 10 capsule 0   cetirizine (ZYRTEC) 10 MG tablet Take 10 mg by mouth at bedtime.     fluticasone (FLONASE) 50 MCG/ACT nasal spray USE TWO SPRAYS IN EACH NOSTRIL DAILY AS NEEDED. 48 mL 0   ketotifen (ZADITOR) 0.025 % ophthalmic solution Place 1 drop into both eyes daily as needed (allergies).     latanoprost (XALATAN) 0.005 % ophthalmic solution INSTILL 1 DROP INTO BOTH EYES AT BEDTIME (Patient taking differently: Place 1 drop into both eyes in the morning.) 11.25 mL 1   losartan (COZAAR) 25 MG tablet TAKE 0.5  TABLETS BY MOUTH AT BEDTIME. 15 tablet 11   montelukast (SINGULAIR) 10 MG tablet TAKE 1 TABLET BY MOUTH EVERY DAY AT BEDTIME 90 tablet 3   NON FORMULARY Cpap at night     rosuvastatin (CRESTOR) 20 MG tablet Take 1 tablet (20 mg total) by mouth daily. (Patient taking differently: Take 20 mg by mouth at bedtime.) 90 tablet 3   sulfamethoxazole-trimethoprim (BACTRIM DS) 800-160 MG tablet Take 1 tablet by mouth at bedtime.     No current facility-administered medications for this visit.   Allergies  Allergen Reactions   Flomax [Tamsulosin] Other (See Comments)    dizziness   Cipro [Ciprofloxacin Hcl] Swelling    Knee swelling, joint pain   Lipitor [Atorvastatin] Other (See Comments)    Leg  Cramps   Lisinopril Cough   Spironolactone Other (See Comments)    Grew breasts    Social History   Socioeconomic History   Marital status: Married    Spouse name: Not on file   Number of children: Not on file   Years of education: 13   Highest education level: Not on file  Occupational History   Occupation: retired  Tobacco Use   Smoking status: Former    Current packs/day: 0.00    Types: Cigarettes    Start date: 1964    Quit date: 1967    Years since quitting: 58.2   Smokeless tobacco: Never  Vaping Use   Vaping status: Never Used  Substance and Sexual Activity   Alcohol use: Not Currently    Comment: no drinks in 3 weeks   Drug use: Never   Sexual activity: Yes  Other Topics Concern   Not on file  Social History Narrative   Is a [place] veteran, has trouble sleeping most nights. Often experiences nightmares w/anesthesia meds.   Social Drivers of Corporate investment banker Strain: Low Risk  (03/05/2020)   Overall Financial Resource Strain (CARDIA)    Difficulty of Paying Living Expenses: Not hard at all  Food Insecurity: No Food Insecurity (09/13/2021)   Received from [facility]   Hunger Vital Sign    Worried About Running Out of Food in the Last Year: Never  true    Ran Out of Food in the Last Year: Never true  Transportation Needs: No Transportation Needs (03/05/2020)   PRAPARE - Administrator, Civil Service (Medical): No    Lack of Transportation (Non-Medical): No  Physical Activity: Unknown (03/05/2020)   Exercise Vital Sign    Days of Exercise per Week: 7 days    Minutes of Exercise per Session: Not on file  Stress: No Stress Concern Present (03/05/2020)   [employer] of Occupational Health - Occupational Stress Questionnaire    Feeling of Stress : Not at all  Social Connections: Unknown (12/27/2021)   Received from [facility], [facility]   Social Network    Social Network: Not on file  Intimate Partner Violence: Unknown (11/18/2021)   Received from [facility]   HITS    Physically Hurt: Not on file    Insult or Talk Down To: Not on file    Threaten Physical Harm: Not on file    Scream or Curse: Not on file   Family History  Problem Relation Age of Onset   Diabetes Mother    COPD Father    Stroke Paternal Grandfather    There were no vitals taken for this visit.  Wt Readings from Last 3 Encounters:  05/19/23 104.3 kg (229 lb 15 oz)  05/15/23 104.3 kg (230 lb)  05/11/23 103.9 kg (229 lb)   Physical Exam General:  Appears younger than stated age Neck: no JVD.  Cor: Regular rate & rhythm. No rubs, gallops or murmurs. Lungs: clear Abdomen: soft, nontender, nondistended.  Extremities: no edema Neuro: alert & orientedx3. Affect pleasant   ECG at [facility] 03/07: SR 92 bpm, 1st degree AVB, PVC   ASSESSMENT & PLAN:  1. CAD/NSTEMI with critcal LM disease - LHC 9/21 with severe multivessel disease.  - s/p CABG 05/18/20 with Maze and LAA clipping. LIMA -> LAD, RIMA  -> PDA. Left radial OM1-> OM2 - NST (2/23) at [facility] negative for ischemia  - Notes occasional chest tightness  with heavy stress.  - Stop aspirin with addition of eliquis. See below. - Continue statin. Stating beta  blocker. See below.  2. Chronic Systolic Heart Failure due to ischemic CM - Echo (2020): EF 60-65%.  - Echo (9/21): EF 25-30%. RV normal. Mild AS   - Echo (10/21): EF improved 40-45% normal RV - Nuclear Stress test at [facility] 2/23 w/ EF 54%  - Echo (7/24 at [facility]): EF 50%, G2DD, RV normal, moderate AS - NYHA II. Euvolemic. Not needing loop diuretic.  - GDMT has been limited by orthostasis. - Continue losartan 12.5 mg at bedtime (dizziness on Entresto).  - Off bisoprolol due to low BP. Agree with addition of metoprolol d/t afib and PVCs on monitor. His asthma has been very stable. - He stopped Inspra (due to increased urination). OK to stay off for now. - Off Jardiance with UTIs. - Check BMET when he comes back for CBC in 2 weeks.  3. PAF  - s/p MAZE w/ LAA Clip  - Holter monitor 12/24: SR with 1% Afib burden longest episode 10 minutes, 6% PVCs, 10% PACs - Off Eliquis with hematuria.  Agree with retrial w/ Eliquis 5 mg BID. Obtain CBC in 2 weeks. Call with any hematuria. - Agree with metoprolol xl 50 mg dailiy  4. H/o Prostate Cancer - Continue alfuzosin - S/p TURP X 4  5. Aortic Stenosis - Echo (7/24) with moderate AS, AVA 0.9 cm2, dimensionless index 0.28, mean gradient 28 mmHg - Repeat echo planned at [facility] in 07/25 - May need referral to Structural down the road for consideration of TAVR - Discussed symptoms to watch for   Follow up: 5 months to review echo  [facility], Analynn Daum N, PA-C 10/29/23

## 2023-10-29 NOTE — Patient Instructions (Signed)
 Medication Changes:  START: ELIQUIS 5MG  BID   START: METOPROLOL SUCCINATE 50MG  DAILY   STOP TAKING ASPIRIN   Lab Work:  RETURN IN 2 WEEKS AS SCHEDULED FOR LAB WORK   Follow-Up in: AUGUST- PLEASE CALL OUR OFFICE AROUND JUNE TO GET SCHEDULED FOR YOUR APPOINTMENT. PHONE NUMBER IS [phone] OPTION 2  At the [facility], you and your health needs are our priority. We have a designated team specialized in the treatment of Heart Failure. This Care Team includes your primary Heart Failure Specialized Cardiologist (physician), Advanced Practice Providers (APPs- Physician Assistants and Nurse Practitioners), and Pharmacist who all work together to provide you with the care you need, when you need it.   You may see any of the following providers on your designated Care Team at your next follow up:  Dr. Arvilla Meres Dr. Marca Ancona Dr. Dorthula Nettles Dr. Theresia Bough Tonye Becket, NP Robbie Lis, [place] [facility] [place], [place] Brynda Peon, NP [place] Lee, NP Karle Plumber, PharmD  Please be sure to bring in all your medications bottles to every appointment.   Need to Contact us:  If you have any questions or concerns before your next appointment please send [place] a message through [place] or call our office at [phone].    TO LEAVE A MESSAGE FOR THE NURSE SELECT OPTION 2, PLEASE LEAVE A MESSAGE INCLUDING: YOUR NAME DATE OF BIRTH CALL BACK NUMBER REASON FOR CALL**this is important as we prioritize the call backs  YOU WILL RECEIVE A CALL BACK THE SAME DAY AS LONG AS YOU CALL BEFORE 4:00 PM

## 2023-11-02 ENCOUNTER — Other Ambulatory Visit (HOSPITAL_BASED_OUTPATIENT_CLINIC_OR_DEPARTMENT_OTHER): Payer: Self-pay

## 2023-11-02 MED ORDER — AMOXICILLIN-POT CLAVULANATE 500-125 MG PO TABS
1.0000 | ORAL_TABLET | Freq: Two times a day (BID) | ORAL | 0 refills | Status: DC
  Filled 2023-11-02: qty 14, 7d supply, fill #0

## 2023-11-03 ENCOUNTER — Other Ambulatory Visit (HOSPITAL_BASED_OUTPATIENT_CLINIC_OR_DEPARTMENT_OTHER): Payer: Self-pay

## 2023-11-03 MED ORDER — AMOXICILLIN-POT CLAVULANATE 500-125 MG PO TABS: ORAL_TABLET | ORAL | 0 refills | Status: DC

## 2023-11-05 ENCOUNTER — Ambulatory Visit: Payer: Medicare Other | Admitting: Psychology

## 2023-11-12 ENCOUNTER — Ambulatory Visit (HOSPITAL_COMMUNITY)
Admission: RE | Admit: 2023-11-12 | Discharge: 2023-11-12 | Disposition: A | Discharge status: Home / Self Care | Source: Ambulatory Visit | Attending: Cardiology | Admitting: Cardiology

## 2023-11-12 DIAGNOSIS — I5022 Chronic systolic (congestive) heart failure: Secondary | ICD-10-CM | POA: Insufficient documentation

## 2023-11-12 LAB — BASIC METABOLIC PANEL WITH GFR
Anion gap: 6 (ref 5–15)
BUN: 16 mg/dL (ref 8–23)
CO2: 25 mmol/L (ref 22–32)
Calcium: 8.8 mg/dL — ABNORMAL LOW (ref 8.9–10.3)
Chloride: 105 mmol/L (ref 98–111)
Creatinine, Ser: 0.91 mg/dL (ref 0.61–1.24)
GFR, Estimated: >60 mL/min (ref >60)
Glucose, Bld: 122 mg/dL — ABNORMAL HIGH (ref 70–99)
Potassium: 3.9 mmol/L (ref 3.5–5.1)
Sodium: 136 mmol/L (ref 135–145)

## 2023-11-12 LAB — CBC
HCT: 37.5 % — ABNORMAL LOW (ref 39.0–52.0)
Hemoglobin: 12.8 g/dL — ABNORMAL LOW (ref 13.0–17.0)
MCH: 30.5 pg (ref 26.0–34.0)
MCHC: 34.1 g/dL (ref 30.0–36.0)
MCV: 89.3 fL (ref 80.0–100.0)
Platelets: 203 10*3/uL (ref 150–400)
RBC: 4.2 MIL/uL — ABNORMAL LOW (ref 4.22–5.81)
RDW: 13.2 % (ref 11.5–15.5)
WBC: 4.8 10*3/uL (ref 4.0–10.5)
nRBC: 0 % (ref 0.0–0.2)

## 2023-11-25 ENCOUNTER — Ambulatory Visit (INDEPENDENT_AMBULATORY_CARE_PROVIDER_SITE_OTHER): Payer: Medicare Other | Admitting: Psychology

## 2023-11-25 DIAGNOSIS — F4323 Adjustment disorder with mixed anxiety and depressed mood: Secondary | ICD-10-CM

## 2023-11-25 NOTE — Progress Notes (Signed)
 [facility] Counselor/Therapist Progress Note  Patient ID: Eric Stokes, MRN: [id number],    Date: 11/25/2023  Time Spent: 4:00pm-4:55pm   55 minutes   Treatment Type: Individual Therapy  Reported Symptoms: stress, worrying  Mental Status Exam: Appearance:  Casual     Behavior: Appropriate  Motor: Normal  Speech/Language:  Normal Rate  Affect: Appropriate  Mood: normal  Thought process: normal  Thought content:   WNL  Sensory/Perceptual disturbances:   WNL  Orientation: oriented to person, place, time/date, and situation  Attention: Good  Concentration: Good  Memory: WNL  Fund of knowledge:  Good  Insight:   Good  Judgment:  Good  Impulse Control: Good   Risk Assessment: Danger to Self:  No Self-injurious Behavior: No Danger to Others: No Duty to Warn:no Physical Aggression / Violence:No  Access to Firearms a concern: No  Gang Involvement:No   Subjective: Pt present for face-to-face individual therapy in person.  Pt states he feels tired and overwhelmed with his health issues and care of his wife who has cognitive issues.  Pt's wife had 3-4 mini strokes that affected her memory.   Pt takes care of his wife's medications.  He gets frustrated with how much he has to look for things that she misplaces.   Addressed the caregiving challenges pt deals with.  Worked on problem solving and stress management.   Pt talked about his health issues.   Pt had radiation treatment a year ago and has prostrate trouble since then.   Pt has a cardiologist also and is having trouble with medications.   Pt feels like he is dragging.  He has cardiac issues that are being monitored.   He has Afib that they don't know what is causing it.   Pt talked about his anxiety.  He tends to have a lot of worry thoughts that he ruminates about.  Addressed the thoughts that pt ruminates on.  Worked on thought reframing.  Addressed how pt can focus on doing something in the present moment  to distract from worry thoughts.  Identified that gardening is a good release for pt.   Pt talked about his sleep.   He continues to get about 8 hours of sleep a night but there are nights every now and then when he has trouble getting back to sleep in the middle of the night.   Addressed how pt deals with those sleepless episodes.  He states he tosses and turns in bed and is frustrated with the lack of sleep.  Encouraged pt to get out of bed in those moments and read a book until he gets sleepy and then return to bed.   Worked on self care strategies. Provided supportive therapy.   Interventions: Cognitive Behavioral Therapy and Insight-Oriented  Diagnosis:  F43.23  Plan of Care: Recommend ongoing therapy.  Pt participated in setting treatment goals.  Pt wants to improve coping skills and feel better so he can help his wife.  Pt would like to enjoy life more.   Plan to meet every two weeks.   Pt is in agreement with treatment plan.    Treatment Plan Client Abilities/Strengths  Pt is bright, engaging, and motivated for therapy.  Client Treatment Preferences  Individual therapy.  Client Statement of Needs  Improve copings skills. Symptoms  Depressed or irritable mood. Excessive and/or unrealistic worry that is difficult to control occurring more days than not for at least 6 months about a number of events or activities. Hypervigilance (  e.g., feeling constantly on edge, experiencing concentration difficulties, having trouble falling or staying asleep, exhibiting a general state of irritability).  Problems Addressed  Unipolar Depression, Anxiety Goals 1. Alleviate depressive symptoms and return to previous level of effective functioning. 2. Appropriately grieve the loss in order to normalize mood and to return to previously adaptive level of functioning. Objective Learn and implement behavioral strategies to overcome depression. Target Date: 2024-09-29 Frequency: Biweekly  Progress: 10  Modality: individual  Related Interventions Assist the client in developing skills that increase the likelihood of deriving pleasure from behavioral activation (e.g., assertiveness skills, developing an exercise plan, less internal/more external focus, increased social involvement); reinforce success. Engage the client in "behavioral activation," increasing his/her activity level and contact with sources of reward, while identifying processes that inhibit activation. use behavioral techniques such as instruction, rehearsal, role-playing, role reversal, as needed, to facilitate activity in the client's daily life; reinforce success. 3. Develop healthy interpersonal relationships that lead to the alleviation and help prevent the relapse of depression. 4. Develop healthy thinking patterns and beliefs about self, others, and the world that lead to the alleviation and help prevent the relapse of depression. 5. Enhance ability to effectively cope with the full variety of life's worries and anxieties. 6. Learn and implement coping skills that result in a reduction of anxiety and worry, and improved daily functioning. Objective Learn and implement problem-solving strategies for realistically addressing worries. Target Date: 2024-09-29 Frequency: Biweekly  Progress: 10 Modality: individual  Related Interventions Assign the client a homework exercise in which he/she problem-solves a current problem (see Mastery of Your Anxiety and Worry: Workbook by Elenora Fender and Filbert Schilder or Generalized Anxiety Disorder by Elesa Hacker, and Filbert Schilder); review, reinforce success, and provide corrective feedback toward improvement. Teach the client problem-solving strategies involving specifically defining a problem, generating options for addressing it, evaluating the pros and cons of each option, selecting and implementing an optional action, and reevaluating and refining the action. Objective Learn and implement calming skills to  reduce overall anxiety and manage anxiety symptoms. Target Date: 2024-09-29 Frequency: Biweekly  Progress: 10 Modality: individual  Related Interventions Assign the client to read about progressive muscle relaxation and other calming strategies in relevant books or treatment manuals (e.g., Progressive Relaxation Training by Twana First; Mastery of Your Anxiety and Worry: Workbook by Earlie Counts). Assign the client homework each session in which he/she practices relaxation exercises daily, gradually applying them progressively from non-anxiety-provoking to anxiety-provoking situations; review and reinforce success while providing corrective feedback toward improvement. Teach the client calming/relaxation skills (e.g., applied relaxation, progressive muscle relaxation, cue controlled relaxation; mindful breathing; biofeedback) and how to discriminate better between relaxation and tension; teach the client how to apply these skills to his/her daily life. 7. Recognize, accept, and cope with feelings of depression. 8. Reduce overall frequency, intensity, and duration of the anxiety so that daily functioning is not impaired. 9. Resolve the core conflict that is the source of anxiety. 10. Stabilize anxiety level while increasing ability to function on a daily basis. Diagnosis F43.23   Conditions For Discharge Achievement of treatment goals and objectives   Salomon Fick, LCSW

## 2023-12-09 ENCOUNTER — Ambulatory Visit (INDEPENDENT_AMBULATORY_CARE_PROVIDER_SITE_OTHER): Payer: Medicare Other | Admitting: Psychology

## 2023-12-09 ENCOUNTER — Other Ambulatory Visit: Payer: Self-pay | Admitting: Urology

## 2023-12-09 DIAGNOSIS — F4323 Adjustment disorder with mixed anxiety and depressed mood: Secondary | ICD-10-CM

## 2023-12-09 NOTE — Progress Notes (Signed)
 [facility] Counselor/Therapist Progress Note  Patient ID: Eric Stokes, MRN: [id number],    Date: 12/09/2023  Time Spent: 10:00am-10:55am   55 minutes   Treatment Type: Individual Therapy  Reported Symptoms: stress, worrying  Mental Status Exam: Appearance:  Casual     Behavior: Appropriate  Motor: Normal  Speech/Language:  Normal Rate  Affect: Appropriate  Mood: normal  Thought process: normal  Thought content:   WNL  Sensory/Perceptual disturbances:   WNL  Orientation: oriented to person, place, time/date, and situation  Attention: Good  Concentration: Good  Memory: WNL  Fund of knowledge:  Good  Insight:   Good  Judgment:  Good  Impulse Control: Good   Risk Assessment: Danger to Self:  No Self-injurious Behavior: No Danger to Others: No Duty to Warn:no Physical Aggression / Violence:No  Access to Firearms a concern: No  Gang Involvement:No   Subjective: Pt present for face-to-face individual therapy in person.  Pt states he feels tired and overwhelmed with his health issues and care of his wife who has cognitive issues.   Addressed the caregiving challenges pt deals with.  Worked on problem solving and stress management.   Pt talked about his health issues.  He gets frustrated with the hassles of accessing care.   Addressed pt's frustrations and worked on problem solving.    Pt talked about his anxiety.  He tends to have a lot of worry thoughts that he ruminates about.  Addressed the thoughts that pt ruminates on.  Worked on thought reframing.  Addressed how pt can focus on doing something in the present moment to distract from worry thoughts.  Identified that gardening is a good release for pt.   Identified that pt has a lot of negative thought patterns he engages in.  He tends to think about worse case scenarios.     Worked with pt on releasing negative thoughts and came up with imagery pt can use to "put the thoughts in a burn container".      Worked with pt on developing a Hydrographic surveyor. Worked on self care strategies. Provided supportive therapy.   Interventions: Cognitive Behavioral Therapy and Insight-Oriented  Diagnosis:  F43.23  Plan of Care: Recommend ongoing therapy.  Pt participated in setting treatment goals.  Pt wants to improve coping skills and feel better so he can help his wife.  Pt would like to enjoy life more.   Plan to meet every two weeks.   Pt is in agreement with treatment plan.    Treatment Plan Client Abilities/Strengths  Pt is bright, engaging, and motivated for therapy.  Client Treatment Preferences  Individual therapy.  Client Statement of Needs  Improve copings skills. Symptoms  Depressed or irritable mood. Excessive and/or unrealistic worry that is difficult to control occurring more days than not for at least 6 months about a number of events or activities. Hypervigilance (e.g., feeling constantly on edge, experiencing concentration difficulties, having trouble falling or staying asleep, exhibiting a general state of irritability).  Problems Addressed  Unipolar Depression, Anxiety Goals 1. Alleviate depressive symptoms and return to previous level of effective functioning. 2. Appropriately grieve the loss in order to normalize mood and to return to previously adaptive level of functioning. Objective Learn and implement behavioral strategies to overcome depression. Target Date: 2024-09-29 Frequency: Biweekly  Progress: 10 Modality: individual  Related Interventions Assist the client in developing skills that increase the likelihood of deriving pleasure from behavioral activation (e.g., assertiveness skills, developing an exercise plan, less  internal/more external focus, increased social involvement); reinforce success. Engage the client in "behavioral activation," increasing his/her activity level and contact with sources of reward, while identifying processes that inhibit activation. use  behavioral techniques such as instruction, rehearsal, role-playing, role reversal, as needed, to facilitate activity in the client's daily life; reinforce success. 3. Develop healthy interpersonal relationships that lead to the alleviation and help prevent the relapse of depression. 4. Develop healthy thinking patterns and beliefs about self, others, and the world that lead to the alleviation and help prevent the relapse of depression. 5. Enhance ability to effectively cope with the full variety of life's worries and anxieties. 6. Learn and implement coping skills that result in a reduction of anxiety and worry, and improved daily functioning. Objective Learn and implement problem-solving strategies for realistically addressing worries. Target Date: 2024-09-29 Frequency: Biweekly  Progress: 10 Modality: individual  Related Interventions Assign the client a homework exercise in which he/she problem-solves a current problem (see Mastery of Your Anxiety and Worry: Workbook by Colbert Dates and Edna Gouty or Generalized Anxiety Disorder by Woodson He, and Edna Gouty); review, reinforce success, and provide corrective feedback toward improvement. Teach the client problem-solving strategies involving specifically defining a problem, generating options for addressing it, evaluating the pros and cons of each option, selecting and implementing an optional action, and reevaluating and refining the action. Objective Learn and implement calming skills to reduce overall anxiety and manage anxiety symptoms. Target Date: 2024-09-29 Frequency: Biweekly  Progress: 10 Modality: individual  Related Interventions Assign the client to read about progressive muscle relaxation and other calming strategies in relevant books or treatment manuals (e.g., Progressive Relaxation Training by Juleen Oakland; Mastery of Your Anxiety and Worry: Workbook by Rodney Clamp). Assign the client homework each session in which he/she  practices relaxation exercises daily, gradually applying them progressively from non-anxiety-provoking to anxiety-provoking situations; review and reinforce success while providing corrective feedback toward improvement. Teach the client calming/relaxation skills (e.g., applied relaxation, progressive muscle relaxation, cue controlled relaxation; mindful breathing; biofeedback) and how to discriminate better between relaxation and tension; teach the client how to apply these skills to his/her daily life. 7. Recognize, accept, and cope with feelings of depression. 8. Reduce overall frequency, intensity, and duration of the anxiety so that daily functioning is not impaired. 9. Resolve the core conflict that is the source of anxiety. 10. Stabilize anxiety level while increasing ability to function on a daily basis. Diagnosis F43.23   Conditions For Discharge Achievement of treatment goals and objectives   Willey Harrier, LCSW

## 2023-12-30 ENCOUNTER — Ambulatory Visit (INDEPENDENT_AMBULATORY_CARE_PROVIDER_SITE_OTHER): Admitting: Psychology

## 2023-12-30 DIAGNOSIS — F4323 Adjustment disorder with mixed anxiety and depressed mood: Secondary | ICD-10-CM | POA: Diagnosis not present

## 2023-12-30 NOTE — Progress Notes (Signed)
 [facility] Counselor/Therapist Progress Note  Patient ID: Eric Stokes, MRN: [id number],    Date: 12/30/2023  Time Spent: 10:00am-10:55am   55 minutes   Treatment Type: Individual Therapy  Reported Symptoms: stress, worrying  Mental Status Exam: Appearance:  Casual     Behavior: Appropriate  Motor: Normal  Speech/Language:  Normal Rate  Affect: Appropriate  Mood: normal  Thought process: normal  Thought content:   WNL  Sensory/Perceptual disturbances:   WNL  Orientation: oriented to person, place, time/date, and situation  Attention: Good  Concentration: Good  Memory: WNL  Fund of knowledge:  Good  Insight:   Good  Judgment:  Good  Impulse Control: Good   Risk Assessment: Danger to Self:  No Self-injurious Behavior: No Danger to Others: No Duty to Warn:no Physical Aggression / Violence:No  Access to Firearms a concern: No  Gang Involvement:No   Subjective: Pt present for face-to-face individual therapy in person.  Pt talked about frustrations with billing.     Addressed pt's frustrations and worked on problem solving.    Pt has thoughts every morning that he does not want to face the day.  He anticipates problems and frustrations and has a negative mindset.  He tends to have a lot of worry thoughts that he ruminates about.  Addressed the thoughts that pt ruminates on.  Worked on thought reframing.  Addressed how pt can focus on doing something in the present moment to distract from worry thoughts.  Addressed the negative thought patterns pt engages in.  He tends to think about worse case scenarios.     Worked with pt on releasing negative thoughts and using imagery to "put the thoughts in a burn container".  Pt states his anxiety has worsened the past couple of years.  He ruminates about things and has trouble releasing things that are beyond his control.   Pt states he feels tired and overwhelmed with his health issues and care of his wife who has  cognitive issues.   Addressed the caregiving challenges pt deals with.  Worked on problem solving and stress management.  Pt states they had an argument last night about her medications.  Pt had to get firm with her bc she was so argumentative.   He thinks she is starting to sundown and has worse issues at night.   Pt states his kids are trying to help some with care giving for pt's wife.   Pt talked about his health issues.  He has to have surgery again for prostate issues.  He will have the surgery on July 18th.   Worked with pt on developing a Hydrographic surveyor. Worked on self care strategies. Provided supportive therapy.   Interventions: Cognitive Behavioral Therapy and Insight-Oriented  Diagnosis:  F43.23  Plan of Care: Recommend ongoing therapy.  Pt participated in setting treatment goals.  Pt wants to improve coping skills and feel better so he can help his wife.  Pt would like to enjoy life more.   Plan to meet every two weeks.   Pt is in agreement with treatment plan.    Treatment Plan Client Abilities/Strengths  Pt is bright, engaging, and motivated for therapy.  Client Treatment Preferences  Individual therapy.  Client Statement of Needs  Improve copings skills. Symptoms  Depressed or irritable mood. Excessive and/or unrealistic worry that is difficult to control occurring more days than not for at least 6 months about a number of events or activities. Hypervigilance (e.g., feeling constantly on edge,  experiencing concentration difficulties, having trouble falling or staying asleep, exhibiting a general state of irritability).  Problems Addressed  Unipolar Depression, Anxiety Goals 1. Alleviate depressive symptoms and return to previous level of effective functioning. 2. Appropriately grieve the loss in order to normalize mood and to return to previously adaptive level of functioning. Objective Learn and implement behavioral strategies to overcome depression. Target Date:  2024-09-29 Frequency: Biweekly  Progress: 10 Modality: individual  Related Interventions Assist the client in developing skills that increase the likelihood of deriving pleasure from behavioral activation (e.g., assertiveness skills, developing an exercise plan, less internal/more external focus, increased social involvement); reinforce success. Engage the client in "behavioral activation," increasing his/her activity level and contact with sources of reward, while identifying processes that inhibit activation. use behavioral techniques such as instruction, rehearsal, role-playing, role reversal, as needed, to facilitate activity in the client's daily life; reinforce success. 3. Develop healthy interpersonal relationships that lead to the alleviation and help prevent the relapse of depression. 4. Develop healthy thinking patterns and beliefs about self, others, and the world that lead to the alleviation and help prevent the relapse of depression. 5. Enhance ability to effectively cope with the full variety of life's worries and anxieties. 6. Learn and implement coping skills that result in a reduction of anxiety and worry, and improved daily functioning. Objective Learn and implement problem-solving strategies for realistically addressing worries. Target Date: 2024-09-29 Frequency: Biweekly  Progress: 10 Modality: individual  Related Interventions Assign the client a homework exercise in which he/she problem-solves a current problem (see Mastery of Your Anxiety and Worry: Workbook by Colbert Dates and Edna Gouty or Generalized Anxiety Disorder by Woodson He, and Edna Gouty); review, reinforce success, and provide corrective feedback toward improvement. Teach the client problem-solving strategies involving specifically defining a problem, generating options for addressing it, evaluating the pros and cons of each option, selecting and implementing an optional action, and reevaluating and refining the  action. Objective Learn and implement calming skills to reduce overall anxiety and manage anxiety symptoms. Target Date: 2024-09-29 Frequency: Biweekly  Progress: 10 Modality: individual  Related Interventions Assign the client to read about progressive muscle relaxation and other calming strategies in relevant books or treatment manuals (e.g., Progressive Relaxation Training by Juleen Oakland; Mastery of Your Anxiety and Worry: Workbook by Rodney Clamp). Assign the client homework each session in which he/she practices relaxation exercises daily, gradually applying them progressively from non-anxiety-provoking to anxiety-provoking situations; review and reinforce success while providing corrective feedback toward improvement. Teach the client calming/relaxation skills (e.g., applied relaxation, progressive muscle relaxation, cue controlled relaxation; mindful breathing; biofeedback) and how to discriminate better between relaxation and tension; teach the client how to apply these skills to his/her daily life. 7. Recognize, accept, and cope with feelings of depression. 8. Reduce overall frequency, intensity, and duration of the anxiety so that daily functioning is not impaired. 9. Resolve the core conflict that is the source of anxiety. 10. Stabilize anxiety level while increasing ability to function on a daily basis. Diagnosis F43.23   Conditions For Discharge Achievement of treatment goals and objectives   Willey Harrier, LCSW

## 2024-01-13 ENCOUNTER — Ambulatory Visit (INDEPENDENT_AMBULATORY_CARE_PROVIDER_SITE_OTHER): Admitting: Psychology

## 2024-01-13 DIAGNOSIS — F4323 Adjustment disorder with mixed anxiety and depressed mood: Secondary | ICD-10-CM | POA: Diagnosis not present

## 2024-01-13 NOTE — Progress Notes (Signed)
  Behavioral Health Counselor/Therapist Progress Note  Patient ID: PIERS BAADE, MRN: [id number],    Date: 01/13/2024  Time Spent: 10:00am-10:55am   55 minutes   Treatment Type: Individual Therapy  Reported Symptoms: stress, worrying  Mental Status Exam: Appearance:  Casual     Behavior: Appropriate  Motor: Normal  Speech/Language:  Normal Rate  Affect: Appropriate  Mood: normal  Thought process: normal  Thought content:   WNL  Sensory/Perceptual disturbances:   WNL  Orientation: oriented to person, place, time/date, and situation  Attention: Good  Concentration: Good  Memory: WNL  Fund of knowledge:  Good  Insight:   Good  Judgment:  Good  Impulse Control: Good   Risk Assessment: Danger to Self:  No Self-injurious Behavior: No Danger to Others: No Duty to Warn:no Physical Aggression / Violence:No  Access to Firearms a concern: No  Gang Involvement:No   Subjective: Pt present for face-to-face individual therapy in person.  Pt talked about taking his wife to the doctor.  She was diagnosed with vascular dementia due to Type 2 diabetes.  She had not been medication compliant for diabetes treatment throughout the years.  Progression of the dementia should be much slower if his wife stays on her diabetes medication.   The cognitive issues pt's wife has won't improve but at least it should not get much worse.  Pt still wants help coping with the current cognitive issues.  Pt states he feels tired and overwhelmed with his health issues and care of his wife who has cognitive issues.   Addressed the caregiving challenges pt deals with.  Worked on problem solving and stress management.   Pt talked about his sleep.  He uses a CPAP and sometimes his sleep is disruptive.  Sometimes pt has trouble getting back to sleep in the middle of the night.  He has to get up 5-6 times a night to go to the bathroom.  At times he has trouble getting back to sleep.  Worked on sleep  strategies.   He tends to have a lot of worry thoughts that he ruminates about.  Addressed the thoughts that pt ruminates on.  Worked on thought reframing.  Addressed how pt can focus on doing something in the present moment to distract from worry thoughts.   Worked with pt on releasing negative thoughts and using imagery.   Worked with pt on developing a Hydrographic surveyor. Worked on self care strategies. Provided supportive therapy.   Interventions: Cognitive Behavioral Therapy and Insight-Oriented  Diagnosis:  F43.23  Plan of Care: Recommend ongoing therapy.  Pt participated in setting treatment goals.  Pt wants to improve coping skills and feel better so he can help his wife.  Pt would like to enjoy life more.   Plan to meet every two weeks.   Pt is in agreement with treatment plan.    Treatment Plan Client Abilities/Strengths  Pt is bright, engaging, and motivated for therapy.  Client Treatment Preferences  Individual therapy.  Client Statement of Needs  Improve copings skills. Symptoms  Depressed or irritable mood. Excessive and/or unrealistic worry that is difficult to control occurring more days than not for at least 6 months about a number of events or activities. Hypervigilance (e.g., feeling constantly on edge, experiencing concentration difficulties, having trouble falling or staying asleep, exhibiting a general state of irritability).  Problems Addressed  Unipolar Depression, Anxiety Goals 1. Alleviate depressive symptoms and return to previous level of effective functioning. 2. Appropriately grieve the loss in  order to normalize mood and to return to previously adaptive level of functioning. Objective Learn and implement behavioral strategies to overcome depression. Target Date: 2024-09-29 Frequency: Biweekly  Progress: 10 Modality: individual  Related Interventions Assist the client in developing skills that increase the likelihood of deriving pleasure from behavioral  activation (e.g., assertiveness skills, developing an exercise plan, less internal/more external focus, increased social involvement); reinforce success. Engage the client in "behavioral activation," increasing his/her activity level and contact with sources of reward, while identifying processes that inhibit activation. use behavioral techniques such as instruction, rehearsal, role-playing, role reversal, as needed, to facilitate activity in the client's daily life; reinforce success. 3. Develop healthy interpersonal relationships that lead to the alleviation and help prevent the relapse of depression. 4. Develop healthy thinking patterns and beliefs about self, others, and the world that lead to the alleviation and help prevent the relapse of depression. 5. Enhance ability to effectively cope with the full variety of life's worries and anxieties. 6. Learn and implement coping skills that result in a reduction of anxiety and worry, and improved daily functioning. Objective Learn and implement problem-solving strategies for realistically addressing worries. Target Date: 2024-09-29 Frequency: Biweekly  Progress: 10 Modality: individual  Related Interventions Assign the client a homework exercise in which he/she problem-solves a current problem (see Mastery of Your Anxiety and Worry: Workbook by Colbert Dates and Edna Gouty or Generalized Anxiety Disorder by Woodson He, and Edna Gouty); review, reinforce success, and provide corrective feedback toward improvement. Teach the client problem-solving strategies involving specifically defining a problem, generating options for addressing it, evaluating the pros and cons of each option, selecting and implementing an optional action, and reevaluating and refining the action. Objective Learn and implement calming skills to reduce overall anxiety and manage anxiety symptoms. Target Date: 2024-09-29 Frequency: Biweekly  Progress: 10 Modality: individual  Related  Interventions Assign the client to read about progressive muscle relaxation and other calming strategies in relevant books or treatment manuals (e.g., Progressive Relaxation Training by Juleen Oakland; Mastery of Your Anxiety and Worry: Workbook by Rodney Clamp). Assign the client homework each session in which he/she practices relaxation exercises daily, gradually applying them progressively from non-anxiety-provoking to anxiety-provoking situations; review and reinforce success while providing corrective feedback toward improvement. Teach the client calming/relaxation skills (e.g., applied relaxation, progressive muscle relaxation, cue controlled relaxation; mindful breathing; biofeedback) and how to discriminate better between relaxation and tension; teach the client how to apply these skills to his/her daily life. 7. Recognize, accept, and cope with feelings of depression. 8. Reduce overall frequency, intensity, and duration of the anxiety so that daily functioning is not impaired. 9. Resolve the core conflict that is the source of anxiety. 10. Stabilize anxiety level while increasing ability to function on a daily basis. Diagnosis F43.23   Conditions For Discharge Achievement of treatment goals and objectives   Willey Harrier, LCSW

## 2024-02-03 ENCOUNTER — Ambulatory Visit (INDEPENDENT_AMBULATORY_CARE_PROVIDER_SITE_OTHER): Admitting: Psychology

## 2024-02-03 DIAGNOSIS — F4323 Adjustment disorder with mixed anxiety and depressed mood: Secondary | ICD-10-CM

## 2024-02-03 NOTE — Progress Notes (Signed)
 [facility] Counselor/Therapist Progress Note  Patient ID: LYONEL MOREJON, MRN: [id number],    Date: 02/03/2024  Time Spent: 10:00am-10:55am   55 minutes   Treatment Type: Individual Therapy  Reported Symptoms: stress, worrying  Mental Status Exam: Appearance:  Casual     Behavior: Appropriate  Motor: Normal  Speech/Language:  Normal Rate  Affect: Appropriate  Mood: normal  Thought process: normal  Thought content:   WNL  Sensory/Perceptual disturbances:   WNL  Orientation: oriented to person, place, time/date, and situation  Attention: Good  Concentration: Good  Memory: WNL  Fund of knowledge:  Good  Insight:   Good  Judgment:  Good  Impulse Control: Good   Risk Assessment: Danger to Self:  No Self-injurious Behavior: No Danger to Others: No Duty to Warn:no Physical Aggression / Violence:No  Access to Firearms a concern: No  Gang Involvement:No   Subjective: Pt present for face-to-face individual therapy in person.  Pt talked about his his health.   He has to have surgery on his prostrate.    Pt is struggling with his medications.  He is having side effects and is not getting the help from his doctors that he needs.  Addressed pt's frustrations.  Pt will meet with his PCP next week to address medication concerns.  Pt talked about his wife.  She puts things in different places and forgets where she puts them and then pt can't find them.  This is frustrating for pt.  Pt gets easily frustrated and has a negative mindset.   Pt states he feels tired and overwhelmed with his health issues and care of his wife who has cognitive issues.   Addressed the caregiving challenges pt deals with.  Worked on problem solving and stress management.   He tends to have a lot of worry thoughts that he ruminates about.  Addressed the thoughts that pt ruminates on.  Worked on thought reframing.  Addressed how pt can focus on doing something in the present moment to distract  from worry thoughts.   Worked with pt on developing a Hydrographic surveyor. Worked on self care strategies. Provided supportive therapy.   Interventions: Cognitive Behavioral Therapy and Insight-Oriented  Diagnosis:  F43.23  Plan of Care: Recommend ongoing therapy.  Pt participated in setting treatment goals.  Pt wants to improve coping skills and feel better so he can help his wife.  Pt would like to enjoy life more.   Plan to meet every two weeks.   Pt is in agreement with treatment plan.    Treatment Plan Client Abilities/Strengths  Pt is bright, engaging, and motivated for therapy.  Client Treatment Preferences  Individual therapy.  Client Statement of Needs  Improve copings skills. Symptoms  Depressed or irritable mood. Excessive and/or unrealistic worry that is difficult to control occurring more days than not for at least 6 months about a number of events or activities. Hypervigilance (e.g., feeling constantly on edge, experiencing concentration difficulties, having trouble falling or staying asleep, exhibiting a general state of irritability).  Problems Addressed  Unipolar Depression, Anxiety Goals 1. Alleviate depressive symptoms and return to previous level of effective functioning. 2. Appropriately grieve the loss in order to normalize mood and to return to previously adaptive level of functioning. Objective Learn and implement behavioral strategies to overcome depression. Target Date: 2024-09-29 Frequency: Biweekly  Progress: 10 Modality: individual  Related Interventions Assist the client in developing skills that increase the likelihood of deriving pleasure from behavioral activation (e.g., assertiveness skills,  developing an exercise plan, less internal/more external focus, increased social involvement); reinforce success. Engage the client in behavioral activation, increasing his/her activity level and contact with sources of reward, while identifying processes that  inhibit activation. use behavioral techniques such as instruction, rehearsal, role-playing, role reversal, as needed, to facilitate activity in the client's daily life; reinforce success. 3. Develop healthy interpersonal relationships that lead to the alleviation and help prevent the relapse of depression. 4. Develop healthy thinking patterns and beliefs about self, others, and the world that lead to the alleviation and help prevent the relapse of depression. 5. Enhance ability to effectively cope with the full variety of life's worries and anxieties. 6. Learn and implement coping skills that result in a reduction of anxiety and worry, and improved daily functioning. Objective Learn and implement problem-solving strategies for realistically addressing worries. Target Date: 2024-09-29 Frequency: Biweekly  Progress: 10 Modality: individual  Related Interventions Assign the client a homework exercise in which he/she problem-solves a current problem (see Mastery of Your Anxiety and Worry: Workbook by Colbert Dates and Edna Gouty or Generalized Anxiety Disorder by Woodson He, and Edna Gouty); review, reinforce success, and provide corrective feedback toward improvement. Teach the client problem-solving strategies involving specifically defining a problem, generating options for addressing it, evaluating the pros and cons of each option, selecting and implementing an optional action, and reevaluating and refining the action. Objective Learn and implement calming skills to reduce overall anxiety and manage anxiety symptoms. Target Date: 2024-09-29 Frequency: Biweekly  Progress: 10 Modality: individual  Related Interventions Assign the client to read about progressive muscle relaxation and other calming strategies in relevant books or treatment manuals (e.g., Progressive Relaxation Training by Juleen Oakland; Mastery of Your Anxiety and Worry: Workbook by Rodney Clamp). Assign the client homework each  session in which he/she practices relaxation exercises daily, gradually applying them progressively from non-anxiety-provoking to anxiety-provoking situations; review and reinforce success while providing corrective feedback toward improvement. Teach the client calming/relaxation skills (e.g., applied relaxation, progressive muscle relaxation, cue controlled relaxation; mindful breathing; biofeedback) and how to discriminate better between relaxation and tension; teach the client how to apply these skills to his/her daily life. 7. Recognize, accept, and cope with feelings of depression. 8. Reduce overall frequency, intensity, and duration of the anxiety so that daily functioning is not impaired. 9. Resolve the core conflict that is the source of anxiety. 10. Stabilize anxiety level while increasing ability to function on a daily basis. Diagnosis F43.23   Conditions For Discharge Achievement of treatment goals and objectives   Willey Harrier, LCSW

## 2024-02-10 NOTE — Progress Notes (Signed)
 COVID Vaccine received:  []  No [x]  Yes Date of any COVID positive Test in last 90 days:   PCP - Clotilda Mail, PA at [facility] Cardiologist / HF clinic- Toribio Fuel, MD  and sees Garen Graces at [facility]   Chest x-ray - 04-24-2022  epic EKG -  05-11-2023 epic Stress Test - 09-2021 at [facility]   CEW ECHO - 05-27-2020 Epic Cardiac Cath - 05-17-2020  epic        Bowel Prep - [x]  No  []   Yes ______   Pacemaker / ICD device [x]  No []  Yes     Spinal Cord Stimulator:[x]  No []  Yes        History of Sleep Apnea? []  No [x]  Yes   CPAP used?- []  No [x]  Yes     Does the patient monitor blood sugar? []  No []  Yes  [x]  N/A   Blood Thinner / Instructions: Eliquis  is already on hold d/t hematuria Aspirin  Instructions:  ASA 81 mg    hold 7 days   ERAS Protocol Ordered: [x]  No  []  Yes Patient is to be NPO after: midnight prior   Comments: Activity level: Patient is unable to climb a flight of stairs without difficulty  Patient can not perform ADLs without assistance.    Anesthesia review: A.fib, CAD- NSTEMI (CABG x4 BURNIE 05-18-2020), CHF, OSA-CPAP, HTN, COPD, Hx TIA, HOH wears HAs, glaucoma   Patient denies shortness of breath, fever, cough and chest pain at PAT appointment.   Patient verbalized understanding and agreement to the Pre-Surgical Instructions that were given to them at this PAT appointment. Patient was also educated of the need to review these PAT instructions again prior to his surgery.I reviewed the appropriate phone numbers to call if they have any and questions or concerns.

## 2024-02-10 NOTE — Patient Instructions (Signed)
 SURGICAL WAITING ROOM VISITATION Patients having surgery or a procedure may have no more than 2 support people in the waiting area - these visitors may rotate in the visitor waiting room.   If the patient needs to stay at the hospital during part of their recovery, the visitor guidelines for inpatient rooms apply.  PRE-OP VISITATION  Pre-op nurse will coordinate an appropriate time for 1 support person to accompany the patient in pre-op.  This support person may not rotate.  This visitor will be contacted when the time is appropriate for the visitor to come back in the pre-op area.  Please refer to the [facility] website for the visitor guidelines for Inpatients (after your surgery is over and you are in a regular room).  You are not required to quarantine at this time prior to your surgery. However, you must do this: Hand Hygiene often Do NOT share personal items Notify your provider if you are in close contact with someone who has COVID or you develop fever 100.4 or greater, new onset of sneezing, cough, sore throat, shortness of breath or body aches.  If you test positive for Covid or have been in contact with anyone that has tested positive in the last 10 days please notify you surgeon.    Your procedure is scheduled on:  Tuesday  February 23, 2024  Report to [facility] Main Entrance: [address] entrance where the [employer] is available.   Report to admitting at:  05:15   AM  Call this number if you have any questions or problems the morning of surgery [phone]  DO NOT EAT OR DRINK ANYTHING AFTER MIDNIGHT THE NIGHT PRIOR TO YOUR SURGERY / PROCEDURE.   FOLLOW  ANY ADDITIONAL PRE OP INSTRUCTIONS YOU RECEIVED FROM YOUR SURGEON'S OFFICE!!!   Oral Hygiene is also important to reduce your risk of infection.        Remember - BRUSH YOUR TEETH THE MORNING OF SURGERY WITH YOUR REGULAR TOOTHPASTE  Do NOT smoke after Midnight the night before surgery.  STOP TAKING all Vitamins,  Herbs and supplements 1 week before your surgery.   Take ONLY these medicines the morning of surgery with A SIP OF WATER : Tylenol . If needed, you may use your Flonase  nasal spray, Albuterol  inhaler and your Eye drops. Please bring your inhaler with you on the day of surgery.   If You have been diagnosed with Sleep Apnea - Bring CPAP mask and tubing day of surgery. We will provide you with a CPAP machine on the day of your surgery.                   You may not have any metal on your body including  jewelry, and body piercing  Do not wear lotions, powders, cologne, or deodorant   Men may shave face and neck.  Contacts, Hearing Aids, dentures or bridgework may not be worn into surgery. DENTURES WILL BE REMOVED PRIOR TO SURGERY PLEASE DO NOT APPLY Poly grip OR ADHESIVES!!!  Patients discharged on the day of surgery will not be allowed to drive home.  Someone NEEDS to stay with you for the first 24 hours after anesthesia.  Do not bring your home medications to the hospital. The Pharmacy will dispense medications listed on your medication list to you during your admission in the Hospital.  Please read over the following fact sheets you were given: IF YOU HAVE QUESTIONS ABOUT YOUR PRE-OP INSTRUCTIONS, PLEASE CALL [phone].   [place] -  Preparing for Surgery Before surgery, you can play an important role.  Because skin is not sterile, your skin needs to be as free of germs as possible.  You can reduce the number of germs on your skin by washing with CHG (chlorahexidine gluconate) soap before surgery.  CHG is an antiseptic cleaner which kills germs and bonds with the skin to continue killing germs even after washing. Please DO NOT use if you have an allergy to CHG or antibacterial soaps.  If your skin becomes reddened/irritated stop using the CHG and inform your nurse when you arrive at Short Stay. Do not shave (including legs and underarms) for at least 48 hours prior to the first CHG  shower.  You may shave your face/neck.  Please follow these instructions carefully:  1.  Shower with CHG Soap the night before surgery and the  morning of surgery.  2.  If you choose to wash your hair, wash your hair first as usual with your normal  shampoo.  3.  After you shampoo, rinse your hair and body thoroughly to remove the shampoo.                             4.  Use CHG as you would any other liquid soap.  You can apply chg directly to the skin and wash.  Gently with a scrungie or clean washcloth.  5.  Apply the CHG Soap to your body ONLY FROM THE NECK DOWN.   Do not use on face/ open                           Wound or open sores. Avoid contact with eyes, ears mouth and genitals (private parts).                       Wash face,  Genitals (private parts) with your normal soap.             6.  Wash thoroughly, paying special attention to the area where your  surgery  will be performed.  7.  Thoroughly rinse your body with warm water  from the neck down.  8.  DO NOT shower/wash with your normal soap after using and rinsing off the CHG Soap.            9.  Pat yourself dry with a clean towel.            10.  Wear clean pajamas.            11.  Place clean sheets on your bed the night of your first shower and do not  sleep with pets.  ON THE DAY OF SURGERY : Do not apply any lotions/deodorants the morning of surgery.  Please wear clean clothes to the hospital/surgery center.    FAILURE TO FOLLOW THESE INSTRUCTIONS MAY RESULT IN THE CANCELLATION OF YOUR SURGERY  PATIENT SIGNATURE_________________________________  NURSE SIGNATURE__________________________________  ________________________________________________________________________

## 2024-02-11 ENCOUNTER — Other Ambulatory Visit: Payer: Self-pay

## 2024-02-11 ENCOUNTER — Encounter (HOSPITAL_COMMUNITY)
Admission: RE | Admit: 2024-02-11 | Discharge: 2024-02-11 | Disposition: A | Discharge status: Home / Self Care | Source: Ambulatory Visit | Attending: Urology | Admitting: Urology

## 2024-02-11 ENCOUNTER — Encounter (HOSPITAL_COMMUNITY): Payer: Self-pay

## 2024-02-11 VITALS — BP 122/56 | HR 72 | Temp 97.8°F | Resp 16 | Ht 68.5 in | Wt 231.0 lb

## 2024-02-11 DIAGNOSIS — Z01812 Encounter for preprocedural laboratory examination: Secondary | ICD-10-CM | POA: Diagnosis present

## 2024-02-11 DIAGNOSIS — I2510 Atherosclerotic heart disease of native coronary artery without angina pectoris: Secondary | ICD-10-CM | POA: Diagnosis not present

## 2024-02-11 DIAGNOSIS — Z01818 Encounter for other preprocedural examination: Secondary | ICD-10-CM

## 2024-02-11 HISTORY — DX: Acute myocardial infarction, unspecified: I21.9

## 2024-02-11 LAB — CBC
HCT: 38 % — ABNORMAL LOW (ref 39.0–52.0)
Hemoglobin: 13 g/dL (ref 13.0–17.0)
MCH: 32.1 pg (ref 26.0–34.0)
MCHC: 34.2 g/dL (ref 30.0–36.0)
MCV: 93.8 fL (ref 80.0–100.0)
Platelets: 241 10*3/uL (ref 150–400)
RBC: 4.05 MIL/uL — ABNORMAL LOW (ref 4.22–5.81)
RDW: 14.4 % (ref 11.5–15.5)
WBC: 3.9 10*3/uL — ABNORMAL LOW (ref 4.0–10.5)
nRBC: 0 % (ref 0.0–0.2)

## 2024-02-11 LAB — BASIC METABOLIC PANEL WITH GFR
Anion gap: 7 (ref 5–15)
BUN: 19 mg/dL (ref 8–23)
CO2: 23 mmol/L (ref 22–32)
Calcium: 8.8 mg/dL — ABNORMAL LOW (ref 8.9–10.3)
Chloride: 108 mmol/L (ref 98–111)
Creatinine, Ser: 0.88 mg/dL (ref 0.61–1.24)
GFR, Estimated: >60 mL/min (ref >60)
Glucose, Bld: 195 mg/dL — ABNORMAL HIGH (ref 70–99)
Potassium: 3.8 mmol/L (ref 3.5–5.1)
Sodium: 138 mmol/L (ref 135–145)

## 2024-02-16 NOTE — Anesthesia Preprocedure Evaluation (Addendum)
 Anesthesia Evaluation  Patient identified by MRN, date of birth, ID band Patient awake    Reviewed: Allergy & Precautions, NPO status , Patient's Chart, lab work & pertinent test results  Airway Mallampati: II  TM Distance: >3 FB Neck ROM: Full    Dental no notable dental hx.    Pulmonary asthma , sleep apnea and Continuous Positive Airway Pressure Ventilation , COPD,  COPD inhaler, former smoker   Pulmonary exam normal        Cardiovascular hypertension, Pt. on medications and Pt. on home beta blockers + CAD, + Past MI, + CABG and +CHF  Normal cardiovascular exam+ Valvular Problems/Murmurs AS      Neuro/Psych TIA negative psych ROS   GI/Hepatic negative GI ROS, Neg liver ROS,,,  Endo/Other  negative endocrine ROS    Renal/GU negative Renal ROS     Musculoskeletal  (+) Arthritis ,    Abdominal  (+) + obese  Peds  Hematology  (+) Blood dyscrasia (Eliquis )   Anesthesia Other Findings BENIGN PROSTATIC HYPERPLASIA  GROSS HEMATURIA  Reproductive/Obstetrics                              Anesthesia Physical Anesthesia Plan  ASA: 3  Anesthesia Plan: General   Post-op Pain Management:    Induction: Intravenous  PONV Risk Score and Plan: 2 and Ondansetron , Dexamethasone  and Treatment may vary due to age or medical condition  Airway Management Planned: LMA  Additional Equipment:   Intra-op Plan:   Post-operative Plan: Extubation in OR  Informed Consent: I have reviewed the patients History and Physical, chart, labs and discussed the procedure including the risks, benefits and alternatives for the proposed anesthesia with the patient or authorized representative who has indicated his/her understanding and acceptance.     Dental advisory given  Plan Discussed with: CRNA  Anesthesia Plan Comments: (PAT note 02/11/2024)        Anesthesia Quick Evaluation

## 2024-02-16 NOTE — Progress Notes (Signed)
 Anesthesia Chart Review   Case: [id number] Date/Time: 02/23/24 0715   Procedure: TURP (TRANSURETHRAL RESECTION OF PROSTATE)   Anesthesia type: General   Diagnosis:      Hyperplasia of prostate with urinary obstruction [N40.1, N13.8]     Gross hematuria [R31.0]   Pre-op diagnosis: BENIGN PROSTATIC HYPERPLASIA AND GROSS HEMATURIA   Location: [facility]   Surgeons: Nieves Cough, MD       DISCUSSION:78 y.o. former smoker with h/o HTN, CAD s/p CABG (05/2020), hx of NSTEMI (2021), A.fib s/p MAZE procedure (2021), systolic CHF, moderate AS, carotid artery disease, COPD, OSA (uses CPAP), hx of TIA?, BPH, prostate cancer, s/p SPT placement.    Patient has underwent multiple urologic procedures on 08/26/2022, 10/10/2022, 10/28/2022, 05/19/2023. No anesthesia complications noted.   Patient has extensive cardiac hx. Had an NSTEMI in 05/2020 and underwent CABG and MAZE procedure. Last seen in clinic on 04/29/2022 and noted to be stable. In the interim he saw Pulmonology at the [facility] for SOB. Inhalers were tried without relief. Echo was obtained which showed worsening AS and they felt worsening SOB was due to this so cardiac clearance was requested.   Nuclear stress test at [facility] (2/23) showed no ischemia, EF 54%.   Pt last seen by cardiology 10/29/2023. Per OV note pt notes dyspnea with heavier exertion which is chronic and stable, denies orthopnea or PNC, no lower extremity edema.   Echo 02/27/2023 with moderate AS, AVA 0.9 cm2, dimensionless index 0.28, mean gradient 28 mmHg   Clearance received from cardiology which states, Patient is moderate risk due to moderate Aortic Stenosis and CAD s/p CABG. You can stop aspirin  5 days prior and stop Eliquis  2 days prior to procedure. Do not stop Eliquis  3 days as strokes have been reported on day #3.  VS: BP (!) 122/56 Comment: right arm sitting  Pulse 72   Temp 36.6 C (Oral)   Resp 16   Ht 5' 8.5 (1.74 m)   Wt 104.8 kg   SpO2 97%   BMI  34.61 kg/m   PROVIDERS: Kenney Clotilda PARAS, PA   LABS: Labs reviewed: Acceptable for surgery. (all labs ordered are listed, but only abnormal results are displayed)  Labs Reviewed  BASIC METABOLIC PANEL WITH GFR - Abnormal; Notable for the following components:      Result Value   Glucose, Bld 195 (*)    Calcium  8.8 (*)    All other components within normal limits  CBC - Abnormal; Notable for the following components:   WBC 3.9 (*)    RBC 4.05 (*)    HCT 38.0 (*)    All other components within normal limits     IMAGES:   EKG:   CV: 02/27/2023 Echo  Interpretation Summary Complete 2D Echocardiogram with Spectral and Color Doppler was performed. Indication: dyspnea Study Quality: Fair Rhythm during study: sinus. The left ventricle is normal in size. There is moderate concentric left ventricular hypertrophy. Left ventricular systolic function is low normal. LVEF is visually estimated at 50% Quantitative LVEF is 59-62% (biplane method of disks) There is anterior-lateral hypokinesis. Grade II diastolic LV dysfunction (pseudonormal pattern) with high-normal LV filling pressures (E/e'-average=12) The right ventricle is normal in size and function. The left atrium is mildly dilated. Right atrial size is normal. Aortic valve is probably trileaflet, calcified and stenotic. Peak transaortic velocity is 3.5 m/s, corresponding to a peak instantaneous gradient of 48 and a mean gradient of 28 mm Hg. AVA is 0.9  cm2 by continuity equation. Dimensionless index is 0.28. There is no significant aortic insufficiency. Trace to mild pulmonic valvular regurgitation. There are mild mitral annulus and leaflet calcifications. There is no mitral stenosis. There is moderate (2+) mitral regurgitation. There is moderate tricuspid regurgitation. RVSP is normal at 22 mm Hg. The IVC is normal in size with an inspiratory collapse of greater then 50%, suggesting normal right atrial  pressure. Ascending thoracic aorta at the upper limits of normal at 3.5 mc. There is no pericardial effusion. Comment: compared with the echocardiograph report of 09/17/2021, there are significant changes. Aortic stenosis severity has worsened from mild to moderate, mean gradient has increased from11 to 28 mm Hg, LVH has developed and LV filling pressures have increased to high-normal level. On the other hand, LVEF has improved from 40-45% then to 50% on the current study.Severity of mitral, tricuspid regurgitation and RVSP have not changed  Past Medical History:  Diagnosis Date   Aortic stenosis, mild    last echo in epic 05-27-2020  ef 40-45%,  mean grandiant , valve area 2.46 cm^2 ,  mild regurg   BPH with obstruction/lower urinary tract symptoms    urologist--- dr nieves   Carotid bruit present 06/15/2018   2015 Carotid Dopplers:  Essentially normal carotid arteries, with very slight hard plaque in the proximal ICA's and serpentine distal vessels. 1-39% bilateral ICA stenosis. Patent vertebral arteries with antegrade flow. Normal subclavian arteries, bilaterally.   Chronic systolic CHF (congestive heart failure) (HCC) 05/2020   followed by dr jaclynn   COPD (chronic obstructive pulmonary disease) (HCC)    Coronary artery disease 04/2020   cardiologist--- dr cherrie;   NSTEMI /  Afib w/ RVR/  Acute CHF/  cardiogenic shock 04-20-2020;  cath 05-17-2020 multivessel disease w/ critical LM;   05-18-2020 s/p CABG x4 and Maze procedure;  nuclear stress study done [facility] 09-17-2021 no ischemia, ef 54%   Essential hypertension, benign 12/21/2008   Qualifier: Diagnosis of  By: Lavona, MD, CODY Agent     Glaucoma, both eyes    History of migraine    History of non-ST elevation myocardial infarction (NSTEMI) 05/17/2020   cardiogenic shock,  AF w/ RVR, acute CHF,  ICM;   s/p CABG w/ MAZE   History of squamous cell carcinoma of skin    History of TIA (transient ischemic attack)  02/28/2019   questionable TIA  per neurology note dr rosemarie, 04-12-2019, likely migraine   Hx of colonic polyps 01/09/2020   Hyperlipidemia    Ischemic cardiomyopathy 04/2020   per cath 09-03-20212  ef 25-30%;   last echo in epic 05-27-2020  ef 40-45%   Malignant neoplasm prostate (HCC) 10/2019   urologist--- dr nieves;  dx 03/ 2021  active survillance   Myocardial infarction (HCC)    OA (osteoarthritis)    OSA on CPAP    Diagnosed at the [facility] 2016    PAF (paroxysmal atrial fibrillation) (HCC) 04/2020   followed by cardiology---  s/p MAZE procedure 05-18-2020, takes ASA   PVC's (premature ventricular contractions)    S/P CABG x 4 05/18/2020   LIMA to LAD,  RIMA to PDA, SVG to OM1 and OM2   Seasonal allergic rhinitis    Sinus bradycardia    event monitor 01-29-2017  mild bradycardia, no pauses, rare PVCs   Vitamin B 12 deficiency    Vitamin D  deficiency 11/26/2010   Wears hearing aid in both ears     Past Surgical History:  Procedure Laterality  Date   CATARACT EXTRACTION W/ INTRAOCULAR LENS IMPLANT Bilateral    CLIPPING OF ATRIAL APPENDAGE Left 05/18/2020   Procedure: CLIPPING OF ATRIAL APPENDAGE USING ATRICURE 45 MM ATRICLIP;  Surgeon: German Bartlett PEDLAR, MD;  [facility];  Service: Open Heart Surgery;  Laterality: Left;   COLONOSCOPY     per pt last one approx 2019  by dr kristie   CORONARY ARTERY BYPASS GRAFT N/A 05/18/2020   Procedure: CORONARY ARTERY BYPASS GRAFTING (CABG) TIMES FOUR USING INTERNAL BILATERAL MAMMARY ARTERIES AND HARVESTED LEFT RADIAL ARTERY.;  Surgeon: German Bartlett PEDLAR, MD;  [facility];  Service: Open Heart Surgery;  Laterality: N/A;  CORONARY ENDARTERECTOMY PERFORMED DURING SURGERY    CYSTOSCOPY WITH URETHRAL DILATATION N/A 10/28/2022   Procedure: CYSTOSCOPY WITH BLADDER NECK DILATATION;  Surgeon: Nieves Cough, MD;  [facility];  Service: Urology;  Laterality: N/A;  75 MINS   GOLD SEED IMPLANT N/A 08/26/2022   Procedure: GOLD  SEED IMPLANT;  Surgeon: Devere Lonni Righter, MD;  Location: [facility];  Service: Urology;  Laterality: N/A;   IABP INSERTION N/A 05/17/2020   Procedure: IABP INSERTION;  Surgeon: Cherrie Toribio SAUNDERS, MD;  [facility];  Service: Cardiovascular;  Laterality: N/A;   INSERTION OF SUPRAPUBIC CATHETER N/A 10/10/2022   Procedure: INSERTION OF SUPRAPUBIC CATHETER cystoscopy urethral dilation;  Surgeon: Lovie Arlyss CROME, MD;  [facility];  Service: Urology;  Laterality: N/A;   KNEE ARTHROPLASTY Right 03/14/2020   Procedure: COMPUTER ASSISTED TOTAL KNEE ARTHROPLASTY;  Surgeon: Fidel Rogue, MD;  [facility];  Service: Orthopedics;  Laterality: Right;   LAPAROSCOPIC CHOLECYSTECTOMY  02/26/2000   @[facility]   by dr merrilyn   MAZE N/A 05/18/2020   Procedure: MAZE USING ATRICURE ISOLATOR CLAMP OLL2;  Surgeon: German Bartlett PEDLAR, MD;  [facility];  Service: Open Heart Surgery;  Laterality: N/A;   RADIAL ARTERY HARVEST Left 05/18/2020   Procedure: RADIAL ARTERY HARVEST;  Surgeon: German Bartlett PEDLAR, MD;  [facility];  Service: Open Heart Surgery;  Laterality: Left;   RIGHT HEART CATH N/A 05/17/2020   Procedure: RIGHT HEART CATH;  Surgeon: Cherrie Toribio SAUNDERS, MD;  [facility];  Service: Cardiovascular;  Laterality: N/A;   RIGHT/LEFT HEART CATH AND CORONARY ANGIOGRAPHY N/A 05/17/2020   Procedure: RIGHT/LEFT HEART CATH AND CORONARY ANGIOGRAPHY;  Surgeon: Court Dorn PARAS, MD;  [facility];  Service: Cardiovascular;  Laterality: N/A;   SPACE OAR INSTILLATION N/A 08/26/2022   Procedure: SPACE OAR INSTILLATION;  Surgeon: Devere Lonni Righter, MD;  Location: [facility];  Service: Urology;  Laterality: N/A;  30 MINS FOR CASE   TEE WITHOUT CARDIOVERSION N/A 05/18/2020   Procedure: TRANSESOPHAGEAL ECHOCARDIOGRAM (TEE);  Surgeon: German Bartlett PEDLAR, MD;  Location: [facility] OR;  Service: Open Heart Surgery;  Laterality: N/A;    THULIUM LASER TURP (TRANSURETHRAL RESECTION OF PROSTATE) N/A 04/15/2022   Procedure: JULIE LASER VAPORIZATION OF PROSTATE;  Surgeon: Nieves Cough, MD;  Location: [facility];  Service: Urology;  Laterality: N/A;  90 MINS FOR CASE   TONSILLECTOMY AND ADENOIDECTOMY  1952   TRANSURETHRAL RESECTION OF PROSTATE N/A 04/15/2022   TRANSURETHRAL RESECTION OF PROSTATE  10/28/2022   Procedure: TRANSURETHRAL RESECTION OF THE PROSTATE (TURP);  Surgeon: Nieves Cough, MD;  [facility];  Service: Urology;;   TRANSURETHRAL RESECTION OF PROSTATE N/A 05/19/2023   Procedure: TRANSURETHRAL RESECTION OF THE PROSTATE (TURP);  Surgeon: Nieves Cough, MD;  [facility];  Service: Urology;  Laterality: N/A;    MEDICATIONS:  acetaminophen  (TYLENOL ) 500 MG tablet   albuterol  (VENTOLIN  HFA) 108 (90 Base) MCG/ACT inhaler   alfuzosin  (UROXATRAL ) 10 MG 24 hr tablet   amoxicillin -clavulanate (AUGMENTIN ) 500-125 MG tablet   apixaban  (ELIQUIS ) 5 MG TABS tablet   aspirin  EC 81 MG tablet   cetirizine (ZYRTEC) 10 MG tablet   fluticasone  (FLONASE ) 50 MCG/ACT nasal spray   ketotifen (ZADITOR) 0.025 % ophthalmic solution   latanoprost  (XALATAN ) 0.005 % ophthalmic solution   losartan  (COZAAR ) 25 MG tablet   metoprolol  succinate (TOPROL -XL) 100 MG 24 hr tablet   montelukast  (SINGULAIR ) 10 MG tablet   NON FORMULARY   rosuvastatin  (CRESTOR ) 20 MG tablet   No current facility-administered medications for this encounter.     Harlene Hoots Ward, PA-C [facility] [phone]

## 2024-02-23 ENCOUNTER — Other Ambulatory Visit (HOSPITAL_BASED_OUTPATIENT_CLINIC_OR_DEPARTMENT_OTHER): Payer: Self-pay

## 2024-02-23 ENCOUNTER — Encounter (HOSPITAL_COMMUNITY)
Admission: RE | Disposition: A | Discharge status: Home / Self Care | Payer: Self-pay | Source: Ambulatory Visit | Attending: Urology

## 2024-02-23 ENCOUNTER — Ambulatory Visit (HOSPITAL_COMMUNITY): Payer: Self-pay | Admitting: Physician Assistant

## 2024-02-23 ENCOUNTER — Other Ambulatory Visit (HOSPITAL_COMMUNITY): Payer: Self-pay

## 2024-02-23 ENCOUNTER — Other Ambulatory Visit: Payer: Self-pay

## 2024-02-23 ENCOUNTER — Encounter (HOSPITAL_COMMUNITY): Payer: Self-pay | Admitting: Urology

## 2024-02-23 ENCOUNTER — Ambulatory Visit (HOSPITAL_COMMUNITY): Admitting: Anesthesiology

## 2024-02-23 ENCOUNTER — Ambulatory Visit (HOSPITAL_COMMUNITY)
Admission: RE | Admit: 2024-02-23 | Discharge: 2024-02-23 | Disposition: A | Discharge status: Home / Self Care | Source: Ambulatory Visit | Attending: Urology | Admitting: Urology

## 2024-02-23 DIAGNOSIS — I2510 Atherosclerotic heart disease of native coronary artery without angina pectoris: Secondary | ICD-10-CM | POA: Diagnosis not present

## 2024-02-23 DIAGNOSIS — J4489 Other specified chronic obstructive pulmonary disease: Secondary | ICD-10-CM | POA: Insufficient documentation

## 2024-02-23 DIAGNOSIS — I5022 Chronic systolic (congestive) heart failure: Secondary | ICD-10-CM

## 2024-02-23 DIAGNOSIS — I110 Hypertensive heart disease with heart failure: Secondary | ICD-10-CM | POA: Insufficient documentation

## 2024-02-23 DIAGNOSIS — N401 Enlarged prostate with lower urinary tract symptoms: Secondary | ICD-10-CM | POA: Insufficient documentation

## 2024-02-23 DIAGNOSIS — R3912 Poor urinary stream: Secondary | ICD-10-CM | POA: Diagnosis not present

## 2024-02-23 DIAGNOSIS — G4733 Obstructive sleep apnea (adult) (pediatric): Secondary | ICD-10-CM | POA: Diagnosis not present

## 2024-02-23 DIAGNOSIS — Z8546 Personal history of malignant neoplasm of prostate: Secondary | ICD-10-CM | POA: Diagnosis not present

## 2024-02-23 DIAGNOSIS — I252 Old myocardial infarction: Secondary | ICD-10-CM | POA: Diagnosis not present

## 2024-02-23 DIAGNOSIS — Z87891 Personal history of nicotine dependence: Secondary | ICD-10-CM | POA: Diagnosis not present

## 2024-02-23 DIAGNOSIS — I480 Paroxysmal atrial fibrillation: Secondary | ICD-10-CM | POA: Insufficient documentation

## 2024-02-23 DIAGNOSIS — Z951 Presence of aortocoronary bypass graft: Secondary | ICD-10-CM | POA: Diagnosis not present

## 2024-02-23 DIAGNOSIS — N138 Other obstructive and reflux uropathy: Secondary | ICD-10-CM

## 2024-02-23 DIAGNOSIS — R310 Gross hematuria: Secondary | ICD-10-CM | POA: Diagnosis present

## 2024-02-23 HISTORY — PX: TRANSURETHRAL RESECTION OF PROSTATE: SHX73

## 2024-02-23 SURGERY — TURP (TRANSURETHRAL RESECTION OF PROSTATE)
Anesthesia: General | Site: Prostate

## 2024-02-23 MED ORDER — LIDOCAINE HCL (PF) 2 % IJ SOLN
INTRAMUSCULAR | Status: DC | PRN
Start: 2024-02-23 — End: 2024-02-23
  Administered 2024-02-23: 40 mg via INTRADERMAL

## 2024-02-23 MED ORDER — PROPOFOL 10 MG/ML IV BOLUS
INTRAVENOUS | Status: AC
  Filled 2024-02-23: qty 20

## 2024-02-23 MED ORDER — FENTANYL CITRATE PF 50 MCG/ML IJ SOSY
PREFILLED_SYRINGE | INTRAMUSCULAR | Status: AC
  Filled 2024-02-23: qty 1

## 2024-02-23 MED ORDER — FENTANYL CITRATE (PF) 100 MCG/2ML IJ SOLN
INTRAMUSCULAR | Status: AC
  Filled 2024-02-23: qty 2

## 2024-02-23 MED ORDER — ACETAMINOPHEN 10 MG/ML IV SOLN
1000.0000 mg | Freq: Once | INTRAVENOUS | Status: DC | PRN
Start: 2024-02-23 — End: 2024-02-23

## 2024-02-23 MED ORDER — ONDANSETRON HCL 4 MG/2ML IJ SOLN
INTRAMUSCULAR | Status: DC | PRN
  Administered 2024-02-23: 4 mg via INTRAVENOUS

## 2024-02-23 MED ORDER — DEXAMETHASONE SODIUM PHOSPHATE 10 MG/ML IJ SOLN
INTRAMUSCULAR | Status: AC
  Filled 2024-02-23: qty 1

## 2024-02-23 MED ORDER — PHENYLEPHRINE HCL (PRESSORS) 10 MG/ML IV SOLN
INTRAVENOUS | Status: AC
  Filled 2024-02-23: qty 1

## 2024-02-23 MED ORDER — ONDANSETRON HCL 4 MG/2ML IJ SOLN
INTRAMUSCULAR | Status: AC
Start: 2024-02-23 — End: 2024-02-23
  Filled 2024-02-23: qty 2

## 2024-02-23 MED ORDER — SODIUM CHLORIDE 0.9 % IV SOLN
2.0000 g | INTRAVENOUS | Status: AC
  Administered 2024-02-23: 2 g via INTRAVENOUS
  Filled 2024-02-23: qty 20

## 2024-02-23 MED ORDER — CHLORHEXIDINE GLUCONATE 0.12 % MT SOLN
15.0000 mL | Freq: Once | OROMUCOSAL | Status: AC
  Administered 2024-02-23: 15 mL via OROMUCOSAL

## 2024-02-23 MED ORDER — PHENYLEPHRINE HCL-NACL 20-0.9 MG/250ML-% IV SOLN
INTRAVENOUS | Status: DC | PRN
Start: 2024-02-23 — End: 2024-02-23
  Administered 2024-02-23: 30 ug/min via INTRAVENOUS

## 2024-02-23 MED ORDER — PROPOFOL 10 MG/ML IV BOLUS
INTRAVENOUS | Status: DC | PRN
  Administered 2024-02-23: 10 mg via INTRAVENOUS
  Administered 2024-02-23: 130 mg via INTRAVENOUS

## 2024-02-23 MED ORDER — FENTANYL CITRATE PF 50 MCG/ML IJ SOSY
25.0000 ug | PREFILLED_SYRINGE | INTRAMUSCULAR | Status: DC | PRN
  Administered 2024-02-23 (×3): 50 ug via INTRAVENOUS

## 2024-02-23 MED ORDER — LACTATED RINGERS IV SOLN: INTRAVENOUS | Status: DC

## 2024-02-23 MED ORDER — PHENYLEPHRINE HCL (PRESSORS) 10 MG/ML IV SOLN
INTRAVENOUS | Status: DC | PRN
  Administered 2024-02-23 (×2): 80 ug via INTRAVENOUS

## 2024-02-23 MED ORDER — APIXABAN 5 MG PO TABS: 5.0000 mg | ORAL_TABLET | Freq: Two times a day (BID) | ORAL | Status: AC

## 2024-02-23 MED ORDER — SODIUM CHLORIDE 0.9 % IR SOLN
Status: DC | PRN
  Administered 2024-02-23: 6000 mL

## 2024-02-23 MED ORDER — ORAL CARE MOUTH RINSE: 15.0000 mL | Freq: Once | OROMUCOSAL | Status: AC

## 2024-02-23 MED ORDER — DEXAMETHASONE SODIUM PHOSPHATE 10 MG/ML IJ SOLN
INTRAMUSCULAR | Status: DC | PRN
  Administered 2024-02-23: 4 mg via INTRAVENOUS

## 2024-02-23 MED ORDER — ONDANSETRON HCL 4 MG/2ML IJ SOLN: 4.0000 mg | Freq: Once | INTRAMUSCULAR | Status: DC | PRN

## 2024-02-23 MED ORDER — SULFAMETHOXAZOLE-TRIMETHOPRIM 400-80 MG PO TABS
1.0000 | ORAL_TABLET | Freq: Every day | ORAL | 0 refills | Status: DC
  Filled 2024-02-23 – 2024-02-26 (×3): qty 5, 5d supply, fill #0

## 2024-02-23 MED ORDER — LIDOCAINE HCL (PF) 2 % IJ SOLN
INTRAMUSCULAR | Status: AC
  Filled 2024-02-23: qty 5

## 2024-02-23 MED ORDER — LIDOCAINE HCL URETHRAL/MUCOSAL 2 % EX GEL
CUTANEOUS | Status: AC
  Filled 2024-02-23: qty 30

## 2024-02-23 MED ORDER — AMISULPRIDE (ANTIEMETIC) 5 MG/2ML IV SOLN
10.0000 mg | Freq: Once | INTRAVENOUS | Status: DC | PRN
Start: 2024-02-23 — End: 2024-02-23

## 2024-02-23 MED ORDER — EPHEDRINE SULFATE (PRESSORS) 50 MG/ML IJ SOLN
INTRAMUSCULAR | Status: DC | PRN
  Administered 2024-02-23: 5 mg via INTRAVENOUS
  Administered 2024-02-23: 7 mg via INTRAVENOUS

## 2024-02-23 MED ORDER — FENTANYL CITRATE (PF) 100 MCG/2ML IJ SOLN
INTRAMUSCULAR | Status: DC | PRN
  Administered 2024-02-23: 25 ug via INTRAVENOUS
  Administered 2024-02-23: 50 ug via INTRAVENOUS
  Administered 2024-02-23: 25 ug via INTRAVENOUS

## 2024-02-23 SURGICAL SUPPLY — 17 items
BAG URINE DRAIN 2000ML AR STRL (UROLOGICAL SUPPLIES) ×2 IMPLANT
BAG URO CATCHER STRL LF (MISCELLANEOUS) ×2 IMPLANT
CATH COUDE 22FR 5CC (CATHETERS) IMPLANT
DRAPE FOOT SWITCH (DRAPES) ×1 IMPLANT
EVACUATOR MICROVAS BLADDER (UROLOGICAL SUPPLIES) ×1 IMPLANT
GLOVE SURG LX STRL 7.5 STRW (GLOVE) ×1 IMPLANT
GOWN STRL REUS W/ TWL XL LVL3 (GOWN DISPOSABLE) ×1 IMPLANT
HOLDER FOLEY CATH W/STRAP (MISCELLANEOUS) IMPLANT
KIT TURNOVER KIT A (KITS) ×2 IMPLANT
LOOP CUT BIPOLAR 24F LRG (ELECTROSURGICAL) IMPLANT
MANIFOLD NEPTUNE II (INSTRUMENTS) ×1 IMPLANT
PACK CYSTO (CUSTOM PROCEDURE TRAY) ×2 IMPLANT
PAD PREP 24X48 CUFFED NSTRL (MISCELLANEOUS) ×2 IMPLANT
SYR 30ML LL (SYRINGE) IMPLANT
SYRINGE TOOMEY IRRIG 70ML (MISCELLANEOUS) ×2 IMPLANT
TUBING CONNECTING 10 (TUBING) ×1 IMPLANT
TUBING UROLOGY SET (TUBING) ×2 IMPLANT

## 2024-02-23 NOTE — Progress Notes (Signed)
 Spoke with Dr Nieves regarding pain meds for patient, He states he can take Tylenol  and Ibuprofen, Patient verbalizes understanding

## 2024-02-23 NOTE — H&P (Signed)
 H&P  Chief Complaint: BPH, gross hematuria  History of Present Illness: Eric Stokes is a 78 year old male with a history of BPH and underwent laser vaporization of the prostate for symptomatic BPH in 2023 and completed external beam radiation in 2024.  He had some collapse of the channel and synechiae which required a suprapubic tube in early 2024.  He then underwent TURP and removal of the SP tube.  He has had some dystrophic calcifications and had some on and off gross hematuria and passed some tissue or Brine shrimp.  Occasional dysuria. January 2025 cystoscopy revealed Borderline obstructing prostate. Partially resected prostate fossa. No hyperplasia. Mild dystrophic calcification along the mid to apical prostate. No bladder neck contracture. For example he had gross hematuria this morning which cleared.  Following that his urine is clear.  He has no foul odor or dysuria today.  No fever.  He also had some pain where the suprapubic tube was not in the right inguinal area.  He is here with his wife and daughter.  Past Medical History:  Diagnosis Date   Aortic stenosis, mild    last echo in epic 05-27-2020  ef 40-45%,  mean grandiant , valve area 2.46 cm^2 ,  mild regurg   BPH with obstruction/lower urinary tract symptoms    urologist--- dr nieves   Carotid bruit present 06/15/2018   2015 Carotid Dopplers:  Essentially normal carotid arteries, with very slight hard plaque in the proximal ICA's and serpentine distal vessels. 1-39% bilateral ICA stenosis. Patent vertebral arteries with antegrade flow. Normal subclavian arteries, bilaterally.   Chronic systolic CHF (congestive heart failure) (HCC) 05/2020   followed by dr jaclynn   COPD (chronic obstructive pulmonary disease) (HCC)    Coronary artery disease 04/2020   cardiologist--- dr cherrie;   NSTEMI /  Afib w/ RVR/  Acute CHF/  cardiogenic shock 04-20-2020;  cath 05-17-2020 multivessel disease w/ critical LM;   05-18-2020 s/p CABG x4  and Maze procedure;  nuclear stress study done [facility] 09-17-2021 no ischemia, ef 54%   Essential hypertension, benign 12/21/2008   Qualifier: Diagnosis of  By: Lavona, MD, CODY Agent     Glaucoma, both eyes    History of migraine    History of non-ST elevation myocardial infarction (NSTEMI) 05/17/2020   cardiogenic shock,  AF w/ RVR, acute CHF,  ICM;   s/p CABG w/ MAZE   History of squamous cell carcinoma of skin    History of TIA (transient ischemic attack) 02/28/2019   questionable TIA  per neurology note dr rosemarie, 04-12-2019, likely migraine   Hx of colonic polyps 01/09/2020   Hyperlipidemia    Ischemic cardiomyopathy 04/2020   per cath 09-03-20212  ef 25-30%;   last echo in epic 05-27-2020  ef 40-45%   Malignant neoplasm prostate (HCC) 10/2019   urologist--- dr nieves;  dx 03/ 2021  active survillance   Myocardial infarction (HCC)    OA (osteoarthritis)    OSA on CPAP    Diagnosed at the [facility] 2016    PAF (paroxysmal atrial fibrillation) (HCC) 04/2020   followed by cardiology---  s/p MAZE procedure 05-18-2020, takes ASA   PVC's (premature ventricular contractions)    S/P CABG x 4 05/18/2020   LIMA to LAD,  RIMA to PDA, SVG to OM1 and OM2   Seasonal allergic rhinitis    Sinus bradycardia    event monitor 01-29-2017  mild bradycardia, no pauses, rare PVCs   Vitamin B 12 deficiency    Vitamin  D deficiency 11/26/2010   Wears hearing aid in both ears    Past Surgical History:  Procedure Laterality Date   CATARACT EXTRACTION W/ INTRAOCULAR LENS IMPLANT Bilateral    CLIPPING OF ATRIAL APPENDAGE Left 05/18/2020   Procedure: CLIPPING OF ATRIAL APPENDAGE USING ATRICURE 45 MM ATRICLIP;  Surgeon: German Bartlett PEDLAR, MD;  [facility];  Service: Open Heart Surgery;  Laterality: Left;   COLONOSCOPY     per pt last one approx 2019  by dr kristie   CORONARY ARTERY BYPASS GRAFT N/A 05/18/2020   Procedure: CORONARY ARTERY BYPASS GRAFTING (CABG) TIMES FOUR USING INTERNAL  BILATERAL MAMMARY ARTERIES AND HARVESTED LEFT RADIAL ARTERY.;  Surgeon: German Bartlett PEDLAR, MD;  [facility];  Service: Open Heart Surgery;  Laterality: N/A;  CORONARY ENDARTERECTOMY PERFORMED DURING SURGERY    CYSTOSCOPY WITH URETHRAL DILATATION N/A 10/28/2022   Procedure: CYSTOSCOPY WITH BLADDER NECK DILATATION;  Surgeon: Nieves Cough, MD;  [facility];  Service: Urology;  Laterality: N/A;  75 MINS   GOLD SEED IMPLANT N/A 08/26/2022   Procedure: GOLD SEED IMPLANT;  Surgeon: Devere Lonni Righter, MD;  Location: [facility];  Service: Urology;  Laterality: N/A;   IABP INSERTION N/A 05/17/2020   Procedure: IABP INSERTION;  Surgeon: Cherrie Toribio SAUNDERS, MD;  [facility];  Service: Cardiovascular;  Laterality: N/A;   INSERTION OF SUPRAPUBIC CATHETER N/A 10/10/2022   Procedure: INSERTION OF SUPRAPUBIC CATHETER cystoscopy urethral dilation;  Surgeon: Lovie Arlyss CROME, MD;  [facility];  Service: Urology;  Laterality: N/A;   KNEE ARTHROPLASTY Right 03/14/2020   Procedure: COMPUTER ASSISTED TOTAL KNEE ARTHROPLASTY;  Surgeon: Fidel Rogue, MD;  [facility];  Service: Orthopedics;  Laterality: Right;   LAPAROSCOPIC CHOLECYSTECTOMY  02/26/2000   @[facility]   by dr merrilyn   MAZE N/A 05/18/2020   Procedure: MAZE USING ATRICURE ISOLATOR CLAMP OLL2;  Surgeon: German Bartlett PEDLAR, MD;  [facility];  Service: Open Heart Surgery;  Laterality: N/A;   RADIAL ARTERY HARVEST Left 05/18/2020   Procedure: RADIAL ARTERY HARVEST;  Surgeon: German Bartlett PEDLAR, MD;  [facility];  Service: Open Heart Surgery;  Laterality: Left;   RIGHT HEART CATH N/A 05/17/2020   Procedure: RIGHT HEART CATH;  Surgeon: Cherrie Toribio SAUNDERS, MD;  [facility];  Service: Cardiovascular;  Laterality: N/A;   RIGHT/LEFT HEART CATH AND CORONARY ANGIOGRAPHY N/A 05/17/2020   Procedure: RIGHT/LEFT HEART CATH AND CORONARY ANGIOGRAPHY;  Surgeon: Court Dorn PARAS, MD;  [facility];  Service: Cardiovascular;  Laterality: N/A;   SPACE OAR INSTILLATION N/A 08/26/2022   Procedure: SPACE OAR INSTILLATION;  Surgeon: Devere Lonni Righter, MD;  Location: [facility];  Service: Urology;  Laterality: N/A;  30 MINS FOR CASE   TEE WITHOUT CARDIOVERSION N/A 05/18/2020   Procedure: TRANSESOPHAGEAL ECHOCARDIOGRAM (TEE);  Surgeon: German Bartlett PEDLAR, MD;  Location: [facility] OR;  Service: Open Heart Surgery;  Laterality: N/A;   THULIUM LASER TURP (TRANSURETHRAL RESECTION OF PROSTATE) N/A 04/15/2022   Procedure: JULIE LASER VAPORIZATION OF PROSTATE;  Surgeon: Nieves Cough, MD;  Location: [facility];  Service: Urology;  Laterality: N/A;  90 MINS FOR CASE   TONSILLECTOMY AND ADENOIDECTOMY  1952   TRANSURETHRAL RESECTION OF PROSTATE N/A 04/15/2022   TRANSURETHRAL RESECTION OF PROSTATE  10/28/2022   Procedure: TRANSURETHRAL RESECTION OF THE PROSTATE (TURP);  Surgeon: Nieves Cough, MD;  [facility];  Service: Urology;;   TRANSURETHRAL RESECTION OF PROSTATE  N/A 05/19/2023   Procedure: TRANSURETHRAL RESECTION OF THE PROSTATE (TURP);  Surgeon: Nieves Cough, MD;  [facility];  Service: Urology;  Laterality: N/A;    Home Medications:  Medications Prior to Admission  Medication Sig Dispense Refill Last Dose/Taking   acetaminophen  (TYLENOL ) 500 MG tablet Take 500-1,000 mg by mouth every 6 (six) hours as needed (pain.).   Past Month   alfuzosin  (UROXATRAL ) 10 MG 24 hr tablet Take 10 mg by mouth at bedtime.   02/22/2024 Bedtime   aspirin  EC 81 MG tablet Take 81 mg by mouth at bedtime. Swallow whole.   02/16/2024   cetirizine (ZYRTEC) 10 MG tablet Take 10 mg by mouth at bedtime.   02/22/2024 Bedtime   Cholecalciferol  (VITAMIN D ) 50 MCG (2000 UT) tablet Take 2,000 Units by mouth daily.   02/16/2024   fluticasone  (FLONASE ) 50 MCG/ACT nasal spray USE TWO SPRAYS IN EACH NOSTRIL DAILY AS NEEDED. 48 mL 0 Past Week   ketotifen (ZADITOR) 0.025  % ophthalmic solution Place 1 drop into both eyes daily as needed (allergies).   Past Week   latanoprost  (XALATAN ) 0.005 % ophthalmic solution INSTILL 1 DROP INTO BOTH EYES AT BEDTIME 11.25 mL 1 02/23/2024 Morning   losartan  (COZAAR ) 25 MG tablet TAKE 0.5 TABLETS BY MOUTH AT BEDTIME. 15 tablet 11 02/22/2024 Bedtime   metoprolol  succinate (TOPROL -XL) 100 MG 24 hr tablet Take 25 mg by mouth at bedtime.   02/22/2024 at  7:00 PM   montelukast  (SINGULAIR ) 10 MG tablet TAKE 1 TABLET BY MOUTH EVERY DAY AT BEDTIME 90 tablet 3 02/22/2024 Bedtime   NON FORMULARY Cpap at night   02/22/2024 Bedtime   rosuvastatin  (CRESTOR ) 20 MG tablet Take 1 tablet (20 mg total) by mouth daily. (Patient taking differently: Take 20 mg by mouth at bedtime.) 90 tablet 3 02/22/2024 Bedtime   albuterol  (VENTOLIN  HFA) 108 (90 Base) MCG/ACT inhaler Inhale 1 puff into the lungs as needed for wheezing or shortness of breath.   More than a month   amoxicillin -clavulanate (AUGMENTIN ) 500-125 MG tablet 1 tablet PO BID (Patient not taking: Reported on 02/05/2024) 14 tablet 0 Not Taking   apixaban  (ELIQUIS ) 5 MG TABS tablet Take 5 mg by mouth 2 (two) times daily.   More than a month   Allergies:  Allergies  Allergen Reactions   Flomax  [Tamsulosin ] Other (See Comments)    dizziness   Cipro [Ciprofloxacin Hcl] Swelling    Knee swelling, joint pain   Lipitor [Atorvastatin] Other (See Comments)    Leg Cramps   Lisinopril Cough   Spironolactone  Other (See Comments)    Grew breasts     Family History  Problem Relation Age of Onset   Diabetes Mother    COPD Father    Stroke Paternal Grandfather    Social History:  reports that he quit smoking about 58 years ago. His smoking use included cigarettes. He started smoking about 61 years ago. He has been exposed to tobacco smoke. He has never used smokeless tobacco. He reports that he does not currently use alcohol . He reports that he does not use drugs.  ROS: A complete review of systems was  performed.  All systems are negative except for pertinent findings as noted. Review of Systems  All other systems reviewed and are negative.    Physical Exam:  Vital signs in last 24 hours: Temp:  [98 F (36.7 C)] 98 F (36.7 C) (07/08 0532) Pulse Rate:  [71] 71 (07/08 0532) Resp:  [16] 16 (07/08  0532) BP: (123)/(81) 123/81 (07/08 0532) SpO2:  [96 %] 96 % (07/08 0532) Weight:  [104.8 kg] 104.8 kg (07/08 9367) General:  Alert and oriented, No acute distress HEENT: Normocephalic, atraumatic Neck: No JVD or lymphadenopathy Cardiovascular: Regular rate and rhythm Lungs: Regular rate and effort Abdomen: Soft, nontender, nondistended, no abdominal masses, SP tube site well-healed.  Palpably normal.  Nontender.  Right and left inguinal areas are nontender.  No hernia noted on either side.  No lymphadenopathy. Back: No CVA tenderness Extremities: No edema Neurologic: Grossly intact  Laboratory Data:  No results found for this or any previous visit (from the past 24 hours). No results found for this or any previous visit (from the past 240 hours). Creatinine: No results for input(s): CREATININE in the last 168 hours.  Impression/Assessment:  BPH with recurrent gross hematuria and dystrophic calcification -again discussed with patient and family difficult situation. We went over the nature risk benefits and alternatives to TURP and cystoscopy to more carefully evaluate his bladder and prostate.  We discussed usually I do a limited resection to prevent stricture and incontinence among other risks.  We want to try to avoid him being reliant on a suprapubic tube or incontinent.  He might require some repeated procedures because he has a large prostate which has been resected and radiated.   Plan:  I discussed with the patient the nature, potential benefits, risks and alternatives to TURP, including side effects of the proposed treatment, the likelihood of the patient achieving the goals of  the procedure, and any potential problems that might occur during the procedure or recuperation. All questions answered. Patient elects to proceed.    Eric Stokes 02/23/2024, 7:10 AM

## 2024-02-23 NOTE — Transfer of Care (Signed)
 Immediate Anesthesia Transfer of Care Note  Patient: Eric Stokes  Procedure(s) Performed: TURP (TRANSURETHRAL RESECTION OF PROSTATE) (Prostate)  Patient Location: PACU  Anesthesia Type:General  Level of Consciousness: awake, alert , oriented, and patient cooperative  Airway & Oxygen Therapy: Patient Spontanous Breathing and Patient connected to face mask oxygen  Post-op Assessment: Report given to RN and Post -op Vital signs reviewed and stable  Post vital signs: Reviewed and stable  Last Vitals:  Vitals Value Taken Time  BP 116/73 02/23/24 08:50  Temp 36.3 C 02/23/24 08:50  Pulse 61 02/23/24 08:54  Resp 17 02/23/24 08:54  SpO2 100 % 02/23/24 08:54  Vitals shown include unfiled device data.  Last Pain:  Vitals:   02/23/24 0850  TempSrc:   PainSc: 0-No pain      Patients Stated Pain Goal: 5 (02/23/24 9367)  Complications: No notable events documented.

## 2024-02-23 NOTE — Op Note (Addendum)
 Preoperative diagnosis: BPH, weak stream and gross hematuria Postoperative diagnosis: Same  Procedure: TURP residual  Surgeon: Nieves  Anesthesia: General  Indication for procedure: Eric Stokes is a 78 year old male with a history of BPH.  He underwent laser vaporization of the prostate prior to external beam radiation therapy.  He then developed synechiae and collapse of the prostatic urethra and suprapubic tube placement.  He underwent a TURP and SP tube removal.  He had some residual synechiae and underwent another TURP residual.  Synechiae and gross hematuria has recurred and he was brought today for above procedure.  Findings: On exam the glans and the meatus appeared normal.  Foreskin appeared normal.  Scrotum appeared normal.  On cystoscopy the urethra was unremarkable, the prostatic urethra revealed the verumontanum to be intact.  Some residual apical tissue.  In the mid prostatic urethra there was synechiae and fusion of the tissue with friable tissue.  Initially it was difficult to find in the channel to the bladder neck and into the bladder.  Bladder neck appeared healthy and patent.  He has a large capacity moderately trabeculated bladder.  No mucosal lesions.  No stones or foreign body in the bladder.  Description of procedure: After consent was obtained patient brought to the operating room.  After adequate anesthesia he was placed lithotomy position and prepped and draped in the usual sterile fashion.  Timeout was performed to confirm the patient and procedure.  Cystoscope was passed per urethra and the verumontanum was visualized.  Heading anteriorly some fusion of the prostate tissue with friable tissue and synechiae were evident.  Initially I could not find the channel into the bladder but then passed through the friable obstructing tissue and found the bladder neck and then the bladder.  Bladder was inspected.  The scope was backed out and swapped out for the continuous-flow sheath with  the visual obturator.  The loop and handle was then passed.  There was more tissue on the right than the left.  I started on the left side at about 5:00 and came just inside the bladder neck and resected some lateral lobe tissue down to the verumontanum.  This freed up the tissue with the right side.  I then took that up the left side toward 12:00 and resected from bladder neck down to apex.  Similar on the right side starting at about 7:00 the tissue was resected down to the verumontanum and then up laterally.  There was some dystrophic calcification up in the proximal prostate near the bladder neck up on the right side 10:00 up to about 12:00.  This was gently resected.  Again bladder neck was not resected and I left some apical tissue.  No resection distal to the verumontanum.  Also the bladder neck was not resected. All the chips were evacuated.  This created an excellent channel.  Hemostasis was excellent at low pressure and with the water  off.  The scope was backed out and a 22 [place] coud catheter was advanced without difficulty.  15 cc were placed in the balloon and it was seated at the bladder neck.  Complications: None  Blood loss: Minimal  Specimens to pathology: Prostate/TURP chips  Drains: 22 French coud catheter  Disposition: Patient stable to PACU.  I called and updated Dorothyann and we went over the procedure, postop care and follow-up.  Again discussed conservative resection and he may need another procedure but things look good right now.  Also discussed he has a large capacity bladder that  he has had for quite some time which might increase his risk of UTI.  Will continue to monitor him closely.

## 2024-02-23 NOTE — Anesthesia Postprocedure Evaluation (Signed)
 Anesthesia Post Note  Patient: Eric Stokes  Procedure(s) Performed: TURP (TRANSURETHRAL RESECTION OF PROSTATE) (Prostate)     Patient location during evaluation: PACU Anesthesia Type: General Level of consciousness: awake Pain management: pain level controlled Vital Signs Assessment: post-procedure vital signs reviewed and stable Respiratory status: spontaneous breathing, nonlabored ventilation and respiratory function stable Cardiovascular status: blood pressure returned to baseline and stable Postop Assessment: no apparent nausea or vomiting Anesthetic complications: no   No notable events documented.  Last Vitals:  Vitals:   02/23/24 0953 02/23/24 1000  BP: (!) 148/84 (!) 152/96  Pulse: 68 61  Resp: 20   Temp: (!) 36.4 C   SpO2: 97% 96%    Last Pain:  Vitals:   02/23/24 0953  TempSrc: Oral  PainSc: 0-No pain                 Alaa Eyerman P Jeffie Widdowson

## 2024-02-23 NOTE — Anesthesia Procedure Notes (Signed)
 Procedure Name: LMA Insertion Date/Time: 02/23/2024 7:41 AM  Performed by: Kathern Rollene LABOR, CRNAPre-anesthesia Checklist: Patient identified, Emergency Drugs available, Suction available and Patient being monitored Patient Re-evaluated:Patient Re-evaluated prior to induction Oxygen Delivery Method: Circle system utilized Preoxygenation: Pre-oxygenation with 100% oxygen Induction Type: IV induction Ventilation: Mask ventilation without difficulty LMA: LMA inserted LMA Size: 4.0 Tube type: Oral Number of attempts: 1 Placement Confirmation: positive ETCO2 and breath sounds checked- equal and bilateral Tube secured with: Tape Dental Injury: Teeth and Oropharynx as per pre-operative assessment

## 2024-02-24 ENCOUNTER — Ambulatory Visit: Admitting: Psychology

## 2024-02-24 ENCOUNTER — Encounter (HOSPITAL_COMMUNITY): Payer: Self-pay | Admitting: Urology

## 2024-02-24 LAB — SURGICAL PATHOLOGY

## 2024-02-26 ENCOUNTER — Other Ambulatory Visit (HOSPITAL_BASED_OUTPATIENT_CLINIC_OR_DEPARTMENT_OTHER): Payer: Self-pay

## 2024-02-26 ENCOUNTER — Other Ambulatory Visit (HOSPITAL_COMMUNITY): Payer: Self-pay

## 2024-02-26 MED ORDER — OXYCODONE HCL 5 MG PO TABS
5.0000 mg | ORAL_TABLET | Freq: Four times a day (QID) | ORAL | 0 refills | Status: DC | PRN
  Filled 2024-02-26 (×2): qty 6, 2d supply, fill #0

## 2024-03-21 ENCOUNTER — Encounter (HOSPITAL_COMMUNITY)

## 2024-03-30 ENCOUNTER — Ambulatory Visit: Admitting: Psychology

## 2024-03-30 DIAGNOSIS — F4323 Adjustment disorder with mixed anxiety and depressed mood: Secondary | ICD-10-CM | POA: Diagnosis not present

## 2024-03-30 NOTE — Progress Notes (Signed)
 [facility] Counselor/Therapist Progress Note  Patient ID: CHALMERS IDDINGS, MRN: [id number],    Date: 03/30/2024  Time Spent: 10:00am-10:55am   55 minutes   Treatment Type: Individual Therapy  Reported Symptoms: stress, worrying  Mental Status Exam: Appearance:  Casual     Behavior: Appropriate  Motor: Normal  Speech/Language:  Normal Rate  Affect: Appropriate  Mood: normal  Thought process: normal  Thought content:   WNL  Sensory/Perceptual disturbances:   WNL  Orientation: oriented to person, place, time/date, and situation  Attention: Good  Concentration: Good  Memory: WNL  Fund of knowledge:  Good  Insight:   Good  Judgment:  Good  Impulse Control: Good   Risk Assessment: Danger to Self:  No Self-injurious Behavior: No Danger to Others: No Duty to Warn:no Physical Aggression / Violence:No  Access to Firearms a concern: No  Gang Involvement:No   Subjective: Pt present for face-to-face individual therapy in person.  Pt talked about his his health.   Pt sees the cardiologist this week bc of issues with afib.  Pt also has COPD and asthma.   Pt had prostrate surgery in July.   Pt states he has been having bad dreams at night.   He sleeps about 10-11 hours a night but still feels exhausted.   He has a CPAP and is treated for sleep apnea.   Some of his medications could be making him drowsy.  Addressed pt's health concerns. Pt states everything seems to be a struggle.   Worked on coping strategies. Pt talked about his wife.   Pt's wife is experiencing cognitive decline.  Pt feels like he has to babysit his wife.  This is frustrating for pt.  Pt gets easily frustrated and has a negative mindset.   Pt states he feels tired and overwhelmed with his health issues and care of his wife who has cognitive issues.   Addressed the caregiving challenges pt deals with.  Worked on problem solving and stress management.   He tends to have a lot of worry thoughts that  he ruminates about.  Addressed the thoughts that pt ruminates on.  Worked on thought reframing.  Addressed how pt can focus on doing something in the present moment to distract from worry thoughts.   Worked with pt on developing a Hydrographic surveyor. Worked on self care strategies. Provided supportive therapy.   Interventions: Cognitive Behavioral Therapy and Insight-Oriented  Diagnosis:  F43.23  Plan of Care: Recommend ongoing therapy.  Pt participated in setting treatment goals.  Pt wants to improve coping skills and feel better so he can help his wife.  Pt would like to enjoy life more.   Plan to meet every two weeks.   Pt is in agreement with treatment plan.    Treatment Plan Client Abilities/Strengths  Pt is bright, engaging, and motivated for therapy.  Client Treatment Preferences  Individual therapy.  Client Statement of Needs  Improve copings skills. Symptoms  Depressed or irritable mood. Excessive and/or unrealistic worry that is difficult to control occurring more days than not for at least 6 months about a number of events or activities. Hypervigilance (e.g., feeling constantly on edge, experiencing concentration difficulties, having trouble falling or staying asleep, exhibiting a general state of irritability).  Problems Addressed  Unipolar Depression, Anxiety Goals 1. Alleviate depressive symptoms and return to previous level of effective functioning. 2. Appropriately grieve the loss in order to normalize mood and to return to previously adaptive level of functioning. Objective Learn  and implement behavioral strategies to overcome depression. Target Date: 2024-09-29 Frequency: Biweekly  Progress: 10 Modality: individual  Related Interventions Assist the client in developing skills that increase the likelihood of deriving pleasure from behavioral activation (e.g., assertiveness skills, developing an exercise plan, less internal/more external focus, increased social  involvement); reinforce success. Engage the client in behavioral activation, increasing his/her activity level and contact with sources of reward, while identifying processes that inhibit activation. use behavioral techniques such as instruction, rehearsal, role-playing, role reversal, as needed, to facilitate activity in the client's daily life; reinforce success. 3. Develop healthy interpersonal relationships that lead to the alleviation and help prevent the relapse of depression. 4. Develop healthy thinking patterns and beliefs about self, others, and the world that lead to the alleviation and help prevent the relapse of depression. 5. Enhance ability to effectively cope with the full variety of life's worries and anxieties. 6. Learn and implement coping skills that result in a reduction of anxiety and worry, and improved daily functioning. Objective Learn and implement problem-solving strategies for realistically addressing worries. Target Date: 2024-09-29 Frequency: Biweekly  Progress: 10 Modality: individual  Related Interventions Assign the client a homework exercise in which he/she problem-solves a current problem (see Mastery of Your Anxiety and Worry: Workbook by Richarda and Jonne or Generalized Anxiety Disorder by Delores Filler, and Jonne); review, reinforce success, and provide corrective feedback toward improvement. Teach the client problem-solving strategies involving specifically defining a problem, generating options for addressing it, evaluating the pros and cons of each option, selecting and implementing an optional action, and reevaluating and refining the action. Objective Learn and implement calming skills to reduce overall anxiety and manage anxiety symptoms. Target Date: 2024-09-29 Frequency: Biweekly  Progress: 10 Modality: individual  Related Interventions Assign the client to read about progressive muscle relaxation and other calming strategies in relevant books or  treatment manuals (e.g., Progressive Relaxation Training by Thornell armin Collier; Mastery of Your Anxiety and Worry: Workbook by Richarda armin Jonne). Assign the client homework each session in which he/she practices relaxation exercises daily, gradually applying them progressively from non-anxiety-provoking to anxiety-provoking situations; review and reinforce success while providing corrective feedback toward improvement. Teach the client calming/relaxation skills (e.g., applied relaxation, progressive muscle relaxation, cue controlled relaxation; mindful breathing; biofeedback) and how to discriminate better between relaxation and tension; teach the client how to apply these skills to his/her daily life. 7. Recognize, accept, and cope with feelings of depression. 8. Reduce overall frequency, intensity, and duration of the anxiety so that daily functioning is not impaired. 9. Resolve the core conflict that is the source of anxiety. 10. Stabilize anxiety level while increasing ability to function on a daily basis. Diagnosis F43.23   Conditions For Discharge Achievement of treatment goals and objectives   Veva Alma, LCSW

## 2024-04-04 ENCOUNTER — Telehealth (HOSPITAL_COMMUNITY): Payer: Self-pay

## 2024-04-04 NOTE — Telephone Encounter (Signed)
 Called to confirm/remind patient of their appointment at [facility] on 04/05/24.   Appointment:   [] Confirmed  [x] Left mess   [] No answer/No voice mail  [] VM Full/unable to leave message  [] Phone not in service  And to bring in all medications and/or complete list.

## 2024-04-04 NOTE — Progress Notes (Signed)
 Advanced [facility] Failure Clinic Note    PCP: Kenney Clotilda PARAS, [place] [facility] Cardiologist: Dr. Micky HF Cardiologist: Dr. Cherrie    HPI  [name] is a 78 y.o. with obesity, HTN, OSA on CPAP, HL, prostate CA, ? Asthma vs COPD, PAF, CAD s/p CABG 05/18/20 and systolic HF.   Echo in 2020 that showed EF 60-65% with normal RV.    Admitted 10/21 with chest pain and shortness of breath. In the ED was in A fib RVR and had ST depression. HS Trop [id number]. Cath showed severe multivessel disease with elevated filling pressures. Echo EF 25-30% with mild AS. IABP placed. Underwent CABG 05/18/20 with Maze and LAA clipping. LIMA -> LAD, RIMA  -> PDA. Left radial OM1-> OM2. Post CABG course was slow but relatively uneventful. Repeat echo 10/10 and showed EF improved 40-45% normal RV. Discharged home on 05/30/20.   Several med intolerances. Bisoprolol  stopped due to low BP. Developed gynecomastia w/ spiro and started on eplerenone . [name] stopped due recurrent UTIs. Off Eliquis  due to hematuria.  Nuclear stress test at [facility] (2/23) showed no ischemia, EF 54%.  Follow up 2/23, had recent exertional dyspnea. Had a few low BP readings w/ dizziness. No med changes.  S/p TURP 8/23.  Seen in ED 04/24/22 with dizziness and SOB. Found to have UTI. D dimer +, CTA negative. Treated with IV ceftriaxone .   He stopped his Inspra  on his own due to frequent urination with resolution of symptoms.    S/p gold seed implant at [facility] (1/24), no anesthesia complications.   S/p insertion of suprapubic catheter (2/24), no anesthesia complications. S/p cystoscopy with bladder neck dilation, TURP 3/24.  Echo 7/24 EF 50%, moderate LVH, G2DD, RV ok, moderate TR, AoV mean gradient increased 11--> 28 mmHg  Underwent TURP residual 05/19/23. No anesthesia complications.  14 day holter at [facility] 12/24: SR with 1% Afib burden (longest episode 10 min), 10.5% PACs, 6% PVCs  S/p TURP 02/23/24  Echo 03/01/24 (at [facility]): EF 50-55%, diastolic  function not well-visualized, mild LVH, moderate to severe AS, peak aortic valve velocity 364 cm/s, AVA 1.0-1.1 cm2, moderate MR and TR, RVSP 25 mmHg  Today he returns for HF follow up. Overall feeling fair. He has been more SOB for past couple of months. He has SOB working in the garden or walking up inclines. No CP or syncope. Saw Cards at [facility] last week, started on diltiazem  for AF. Legs have been swelling. 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 He has occasional dizziness. Denies palpitations, abnormal bleeding, or PND/Orthopnea. Appetite ok. Weight at home 231 pounds. Previously worked in [employer] for 43 years. Wears CPAP. Multiple medication intolerances.    Past Medical History:  Diagnosis Date   Aortic stenosis, mild    last echo in epic 05-27-2020  ef 40-45%,  mean grandiant , valve area 2.46 cm^2 ,  mild regurg   BPH with obstruction/lower urinary tract symptoms    urologist--- dr nieves   Carotid bruit present 06/15/2018   2015 Carotid Dopplers:  Essentially normal carotid arteries, with very slight hard plaque in the proximal ICA's and serpentine distal vessels. 1-39% bilateral ICA stenosis. Patent vertebral arteries with antegrade flow. Normal subclavian arteries, bilaterally.   Chronic systolic CHF (congestive heart failure) (HCC) 05/2020   followed by dr jaclynn   COPD (chronic obstructive pulmonary disease) [facility])    Coronary artery disease 04/2020   cardiologist--- dr cherrie;   NSTEMI /  Afib  w/ RVR/  Acute CHF/  cardiogenic shock 04-20-2020;  cath 05-17-2020 multivessel disease w/ critical LM;   05-18-2020 s/p CABG x4 and Maze procedure;  nuclear stress study done [facility] 09-17-2021 no ischemia, ef 54%   Essential hypertension, benign 12/21/2008   Qualifier: Diagnosis of  By: Lavona, MD, CODY Agent     Glaucoma, both eyes    History of migraine    History of non-ST elevation myocardial infarction (NSTEMI) 05/17/2020    cardiogenic shock,  AF w/ RVR, acute CHF,  ICM;   s/p CABG w/ MAZE   History of squamous cell carcinoma of skin    History of TIA (transient ischemic attack) 02/28/2019   questionable TIA  per neurology note dr rosemarie, 04-12-2019, likely migraine   Hx of colonic polyps 01/09/2020   Hyperlipidemia    Ischemic cardiomyopathy 04/2020   per cath 09-03-20212  ef 25-30%;   last echo in epic 05-27-2020  ef 40-45%   Malignant neoplasm prostate (HCC) 10/2019   urologist--- dr nieves;  dx 03/ 2021  active survillance   Myocardial infarction (HCC)    OA (osteoarthritis)    OSA on CPAP    Diagnosed at the [facility] 2016    PAF (paroxysmal atrial fibrillation) (HCC) 04/2020   followed by cardiology---  s/p MAZE procedure 05-18-2020, takes ASA   PVC's (premature ventricular contractions)    S/P CABG x 4 05/18/2020   LIMA to LAD,  RIMA to PDA, SVG to OM1 and OM2   Seasonal allergic rhinitis    Sinus bradycardia    event monitor 01-29-2017  mild bradycardia, no pauses, rare PVCs   Vitamin B 12 deficiency    Vitamin D  deficiency 11/26/2010   Wears hearing aid in both ears    Current Outpatient Medications  Medication Sig Dispense Refill   acetaminophen  (TYLENOL ) 500 MG tablet Take 500-1,000 mg by mouth every 6 (six) hours as needed (pain.).     albuterol  (VENTOLIN  HFA) 108 (90 Base) MCG/ACT inhaler Inhale 1 puff into the lungs as needed for wheezing or shortness of breath.     alfuzosin  (UROXATRAL ) 10 MG 24 hr tablet Take 10 mg by mouth at bedtime.     aspirin  EC 81 MG tablet Take 81 mg by mouth at bedtime. Swallow whole.     cetirizine (ZYRTEC) 10 MG tablet Take 10 mg by mouth at bedtime.     Cholecalciferol  (VITAMIN D ) 50 MCG (2000 UT) tablet Take 2,000 Units by mouth daily.     diltiazem  (CARDIZEM  LA) 240 MG 24 hr tablet Take 240 mg by mouth daily.     fluticasone  (FLONASE ) 50 MCG/ACT nasal spray USE TWO SPRAYS IN EACH NOSTRIL DAILY AS NEEDED. 48 mL 0   ketotifen (ZADITOR) 0.025 %  ophthalmic solution Place 1 drop into both eyes daily as needed (allergies).     latanoprost  (XALATAN ) 0.005 % ophthalmic solution INSTILL 1 DROP INTO BOTH EYES AT BEDTIME 11.25 mL 1   metoprolol  succinate (TOPROL -XL) 100 MG 24 hr tablet Take 25 mg by mouth at bedtime.     montelukast  (SINGULAIR ) 10 MG tablet TAKE 1 TABLET BY MOUTH EVERY DAY AT BEDTIME 90 tablet 3   NON FORMULARY Cpap at night     rosuvastatin  (CRESTOR ) 20 MG tablet Take 1 tablet (20 mg total) by mouth daily. (Patient taking differently: Take 20 mg by mouth at bedtime.) 90 tablet 3   sulfamethoxazole -trimethoprim  (BACTRIM ) 400-80 MG tablet Take 1 tablet by mouth at bedtime. 5 tablet 0  apixaban  (ELIQUIS ) 5 MG TABS tablet Take 1 tablet (5 mg total) by mouth 2 (two) times daily. (Patient not taking: Reported on 04/05/2024)     losartan  (COZAAR ) 25 MG tablet TAKE 0.5 TABLETS BY MOUTH AT BEDTIME. (Patient not taking: Reported on 04/05/2024) 15 tablet 11   oxyCODONE  (OXY IR/ROXICODONE ) 5 MG immediate release tablet Take 1 tablet (5 mg total) by mouth every 6 (six) hours as needed. (Patient not taking: Reported on 04/05/2024) 6 tablet 0   No current facility-administered medications for this encounter.   Allergies  Allergen Reactions   Flomax  [Tamsulosin ] Other (See Comments)    dizziness   Cipro [Ciprofloxacin Hcl] Swelling    Knee swelling, joint pain   Lipitor [Atorvastatin] Other (See Comments)    Leg Cramps   Lisinopril Cough   Spironolactone  Other (See Comments)    Grew breasts    Social History   Socioeconomic History   Marital status: Married    Spouse name: Not on file   Number of children: Not on file   Years of education: 13   Highest education level: Not on file  Occupational History   Occupation: retired  Tobacco Use   Smoking status: Former    Current packs/day: 0.00    Types: Cigarettes    Start date: 1964    Quit date: 1967    Years since quitting: 58.6    Passive exposure: Past   Smokeless tobacco:  Never  Vaping Use   Vaping status: Never Used  Substance and Sexual Activity   Alcohol  use: Not Currently    Comment: no drinks in 3 weeks   Drug use: Never   Sexual activity: Yes  Other Topics Concern   Not on file  Social History Narrative   Is a [place] veteran, has trouble sleeping most nights. Often experiences nightmares w/anesthesia meds.   Social Drivers of Corporate investment banker Strain: Low Risk  (03/05/2020)   Overall Financial Resource Strain (CARDIA)    Difficulty of Paying Living Expenses: Not hard at all  Food Insecurity: No Food Insecurity (09/13/2021)   [facility]   Hunger Vital Sign    Within the past 12 months, you worried that your food would run out before you got the money to buy more.: Never true    Within the past 12 months, the food you bought just didn't last and you didn't have money to get more.: Never true  Transportation Needs: No Transportation Needs (03/05/2020)   PRAPARE - Administrator, Civil Service (Medical): No    Lack of Transportation (Non-Medical): No  Physical Activity: Unknown (03/05/2020)   Exercise Vital Sign    Days of Exercise per Week: 7 days    Minutes of Exercise per Session: Not on file  Stress: No Stress Concern Present (03/05/2020)   [employer] of Occupational Health - Occupational Stress Questionnaire    Feeling of Stress : Not at all  Social Connections: Unknown (12/27/2021)   Received from [facility]   Social Network    Social Network: Not on file  Intimate Partner Violence: Unknown (11/18/2021)   Received from [facility]   HITS    Physically Hurt: Not on file    Insult or Talk Down To: Not on file    Threaten Physical Harm: Not on file    Scream or Curse: Not on file   Family History  Problem Relation Age of Onset   Diabetes Mother    COPD  Father    Stroke Paternal Grandfather    BP (!) 140/62   Pulse (!) 56   Ht 5' 8.5 (1.74 m)   Wt 110.4 kg (243 lb 6.4 oz)   SpO2  98%   BMI 36.47 kg/m   Wt Readings from Last 3 Encounters:  04/05/24 110.4 kg (243 lb 6.4 oz)  02/23/24 104.8 kg (231 lb)  02/11/24 104.8 kg (231 lb)   PHYSICAL EXAM: General:  NAD. No resp difficulty, walked into clinic HEENT: Normal Neck: Supple. Thick neck, JVP 10 Cor: Irregular rate & rhythm. No rubs, gallops, 2/6 SEM RUSB Lungs: Clear, diminished in bases Abdomen: Soft, obese, nontender, nondistended.  Extremities: No cyanosis, clubbing, rash, 2+ BLE edema to knees Neuro: Alert & oriented x 3, moves all 4 extremities w/o difficulty. Affect pleasant.  ECG (personally reviewed): SR with frequent PVCs  ReDs reading: 46%, abnormal  ASSESSMENT & PLAN:  1. CAD/NSTEMI with critcal LM disease - LHC 9/21 with severe multivessel disease.  - s/p CABG 05/18/20 with Maze and LAA clipping. LIMA -> LAD, RIMA  -> PDA. Left radial OM1-> OM2 - NST (2/23) at [facility] negative for ischemia  - No chest pain.  - Continue ASA 81 mg daily. - Continue rosuvastatin  10 mg daily   2. Chronic Systolic Heart Failure due to ischemic CM - Echo (2020): EF 60-65%.  - Echo (9/21): EF 25-30%. RV normal. Mild AS   - Echo (10/21): EF improved 40-45% normal RV - Nuclear Stress test at [facility] 2/23 w/ EF 54%  - Echo (7/24 at [facility]): EF 50%, G2DD, RV normal, moderate AS - Echo (7/25 at [facility] EF 50-55%, diastolic function not well-visualized, mild LVH, moderate to severe AS. - Worse NYHA IIb-III, volume up today. ReDs 46% (body habitus also contributing to elevated reading). Suspect AF/PVCs and valvular disease contributing to volume. - Multiple medication intolerances, GDMT has been limited by orthostasis as well - Place compression hose - Restart Jardiance  10 mg daily. (Had UTIs in past but none recently, last TURP 02/20/24) - Will give Lasix  20 mg PRN. - He stopped losartan  & Inspra . - Labs today. - Given multiple GDMT intolerances, orthostasis, AF, valvular disease --> will check PYP and get multiple  myeloma panel.  3. PAF  - s/p MAZE w/ LAA Clip  - Holter monitor 12/24: SR with 1% Afib burden longest episode 10 minutes, 6% PVCs, 10% PACs. - Previous off Eliquis  2/2 hematuria, he is unsure if he wants to restart. I strongly advised he restart Eliquis  5 mg bid, instructed to stop ASA if he restarts AC. - Continue diltiazem  240 mg daily, per [facility] cardiology. Will need to stop if PYP suggestive of cardiac amyloid - Personally reviewed ECG from [place] on 04/01/24, appears AF with RVR, HR 115, with polymorphic PVCs  4. PVCs - Frequent on ECG today - Asymptomatic - He is now on diltiazem  240 mg, per Dr. SHAUNNA at [facility] - Place 2 week Zio to quantify burden - Continue CPAP  5. Aortic Stenosis - Echo (7/24) with moderate AS, AVA 0.9 cm2, dimensionless index 0.28, mean gradient 28 mmHg - Echo 7/25 at [facility]: moderate to severe AS, peak aortic valve velocity 364 cm/s, AVA 1.0-1.1 cm2, moderate MR and TR, RVSP - With worsening symptoms, recommend referral to Structural Heart Team for TAVR eval. - Discussed symptoms to watch for.  6. H/o Prostate Cancer - Continue alfuzosin . - S/p TURP X 4.  I personally discussed today's plan with patient's daughter,  Cathy, via phone.  Follow up in 2 months with Dr. Bensimhon   Ngoc Daughtridge M Clester Chlebowski, FNP 04/05/24

## 2024-04-05 ENCOUNTER — Encounter (HOSPITAL_COMMUNITY): Payer: Self-pay

## 2024-04-05 ENCOUNTER — Inpatient Hospital Stay (HOSPITAL_COMMUNITY)
Admission: RE | Admit: 2024-04-05 | Discharge: 2024-04-05 | Disposition: A | Discharge status: Home / Self Care | Source: Ambulatory Visit | Attending: Internal Medicine | Admitting: Internal Medicine

## 2024-04-05 ENCOUNTER — Ambulatory Visit (HOSPITAL_COMMUNITY)
Admission: RE | Admit: 2024-04-05 | Discharge: 2024-04-05 | Disposition: A | Discharge status: Home / Self Care | Source: Ambulatory Visit | Attending: Family Medicine

## 2024-04-05 ENCOUNTER — Other Ambulatory Visit (HOSPITAL_BASED_OUTPATIENT_CLINIC_OR_DEPARTMENT_OTHER): Payer: Self-pay

## 2024-04-05 ENCOUNTER — Other Ambulatory Visit (HOSPITAL_COMMUNITY): Payer: Self-pay | Admitting: Internal Medicine

## 2024-04-05 ENCOUNTER — Other Ambulatory Visit: Payer: Self-pay

## 2024-04-05 VITALS — BP 140/62 | HR 56 | Ht 68.5 in | Wt 243.4 lb

## 2024-04-05 DIAGNOSIS — R0602 Shortness of breath: Secondary | ICD-10-CM | POA: Diagnosis present

## 2024-04-05 DIAGNOSIS — I255 Ischemic cardiomyopathy: Secondary | ICD-10-CM | POA: Diagnosis not present

## 2024-04-05 DIAGNOSIS — I493 Ventricular premature depolarization: Secondary | ICD-10-CM

## 2024-04-05 DIAGNOSIS — E785 Hyperlipidemia, unspecified: Secondary | ICD-10-CM | POA: Diagnosis not present

## 2024-04-05 DIAGNOSIS — I350 Nonrheumatic aortic (valve) stenosis: Secondary | ICD-10-CM | POA: Insufficient documentation

## 2024-04-05 DIAGNOSIS — Z8546 Personal history of malignant neoplasm of prostate: Secondary | ICD-10-CM | POA: Insufficient documentation

## 2024-04-05 DIAGNOSIS — G4733 Obstructive sleep apnea (adult) (pediatric): Secondary | ICD-10-CM | POA: Insufficient documentation

## 2024-04-05 DIAGNOSIS — I2510 Atherosclerotic heart disease of native coronary artery without angina pectoris: Secondary | ICD-10-CM | POA: Diagnosis not present

## 2024-04-05 DIAGNOSIS — I252 Old myocardial infarction: Secondary | ICD-10-CM | POA: Insufficient documentation

## 2024-04-05 DIAGNOSIS — I5022 Chronic systolic (congestive) heart failure: Secondary | ICD-10-CM | POA: Diagnosis not present

## 2024-04-05 DIAGNOSIS — Z7901 Long term (current) use of anticoagulants: Secondary | ICD-10-CM | POA: Diagnosis not present

## 2024-04-05 DIAGNOSIS — J984 Other disorders of lung: Secondary | ICD-10-CM | POA: Insufficient documentation

## 2024-04-05 DIAGNOSIS — Z951 Presence of aortocoronary bypass graft: Secondary | ICD-10-CM | POA: Diagnosis not present

## 2024-04-05 DIAGNOSIS — I110 Hypertensive heart disease with heart failure: Secondary | ICD-10-CM | POA: Diagnosis present

## 2024-04-05 DIAGNOSIS — I480 Paroxysmal atrial fibrillation: Secondary | ICD-10-CM | POA: Insufficient documentation

## 2024-04-05 DIAGNOSIS — C61 Malignant neoplasm of prostate: Secondary | ICD-10-CM

## 2024-04-05 DIAGNOSIS — Z79899 Other long term (current) drug therapy: Secondary | ICD-10-CM | POA: Diagnosis not present

## 2024-04-05 DIAGNOSIS — Z636 Dependent relative needing care at home: Secondary | ICD-10-CM | POA: Diagnosis not present

## 2024-04-05 DIAGNOSIS — Z7982 Long term (current) use of aspirin: Secondary | ICD-10-CM | POA: Diagnosis not present

## 2024-04-05 LAB — BASIC METABOLIC PANEL WITH GFR
Anion gap: 8 (ref 5–15)
BUN: 14 mg/dL (ref 8–23)
CO2: 23 mmol/L (ref 22–32)
Calcium: 8.9 mg/dL (ref 8.9–10.3)
Chloride: 106 mmol/L (ref 98–111)
Creatinine, Ser: 0.88 mg/dL (ref 0.61–1.24)
GFR, Estimated: >60 mL/min (ref >60)
Glucose, Bld: 167 mg/dL — ABNORMAL HIGH (ref 70–99)
Potassium: 3.7 mmol/L (ref 3.5–5.1)
Sodium: 137 mmol/L (ref 135–145)

## 2024-04-05 LAB — BRAIN NATRIURETIC PEPTIDE: B Natriuretic Peptide: 252.8 pg/mL — ABNORMAL HIGH (ref 0.0–100.0)

## 2024-04-05 MED ORDER — EMPAGLIFLOZIN 10 MG PO TABS
10.0000 mg | ORAL_TABLET | Freq: Every day | ORAL | 5 refills | Status: DC
  Filled 2024-04-05 (×2): qty 30, 30d supply, fill #0

## 2024-04-05 MED ORDER — FUROSEMIDE 20 MG PO TABS: 20.0000 mg | ORAL_TABLET | Freq: Every day | ORAL | 3 refills | Status: DC | PRN

## 2024-04-05 MED ORDER — EMPAGLIFLOZIN 10 MG PO TABS: 10.0000 mg | ORAL_TABLET | Freq: Every day | ORAL | 5 refills | Status: DC

## 2024-04-05 MED ORDER — FUROSEMIDE 20 MG PO TABS
20.0000 mg | ORAL_TABLET | Freq: Every day | ORAL | 3 refills | Status: AC | PRN
  Filled 2024-04-05 (×2): qty 30, 30d supply, fill #0

## 2024-04-05 NOTE — Addendum Note (Signed)
 Encounter addended by: Tita Andriette NOVAK, RN on: 04/05/2024 12:20 PM  Actions taken: Order list changed, Diagnosis association updated

## 2024-04-05 NOTE — Patient Instructions (Addendum)
 Medication Changes:  IF YOU RESTART ELIQUIS ---PLEASE STOP ASPIRIN    RESTART JARDIANCE  10MG  ONCE DAILY   TAKE LASIX  (FUROSEMIDE ) 20MG  ONCE DAILY AS NEEDED   Lab Work:  Labs done today, your results will be available in MyChart, we will contact you for abnormal readings.  Testing/Procedures:  Your provider has recommended that  you wear a Zio Patch for 14 days.  This monitor will record your heart rhythm for our review.  IF you have any symptoms while wearing the monitor please press the button.  If you have any issues with the patch or you notice a red or orange light on it please call the company at [phone].  Once you remove the patch please mail it back to the company as soon as possible so we can get the results.  You have been ordered a PYP Scan.  This is done at the [facility] located on [facility], they will call you to schedule.  When you come for this test please plan to be there 2-3 hours.  They are located at: [address] [place], [place] [address]   Special Instructions // Education:  PLEASE WEAR COMPRESSION STOCKINGS DAILY   Follow-Up in:  2 MONTHS WITH DR. CHERRIE   At the [facility], you and your health needs are our priority. We have a designated team specialized in the treatment of Heart Failure. This Care Team includes your primary Heart Failure Specialized Cardiologist (physician), Advanced Practice Providers (APPs- Physician Assistants and Nurse Practitioners), and Pharmacist who all work together to provide you with the care you need, when you need it.   You may see any of the following providers on your designated Care Team at your next follow up:  Dr. Toribio CHERRIE Dr. Ezra Shuck Dr. Ria Commander Dr. Odis Brownie Greig Mosses, NP Caffie Shed, [place] [facility] [place], [place] Beckey Coe, NP [place] Lee, NP Tinnie Redman, PharmD   Please be sure to bring in all your medications  bottles to every appointment.   Need to Contact Us :  If you have any questions or concerns before your next appointment please send us  a message through [employer] or call our office at [phone].    TO LEAVE A MESSAGE FOR THE NURSE SELECT OPTION 2, PLEASE LEAVE A MESSAGE INCLUDING: YOUR NAME DATE OF BIRTH CALL BACK NUMBER REASON FOR CALL**this is important as we prioritize the call backs  YOU WILL RECEIVE A CALL BACK THE SAME DAY AS LONG AS YOU CALL BEFORE 4:00 PM

## 2024-04-05 NOTE — Progress Notes (Signed)
 Zio patch placed onto patient.  All instructions and information reviewed with patient, they verbalize understanding with no questions.

## 2024-04-05 NOTE — Progress Notes (Signed)
 ReDS Vest / Clip - 04/05/24 0900       ReDS Vest / Clip   Station Marker B    Ruler Value 39.5    ReDS Value Range High volume overload    ReDS Actual Value 46

## 2024-04-06 ENCOUNTER — Encounter (HOSPITAL_COMMUNITY): Payer: Self-pay | Admitting: Internal Medicine

## 2024-04-06 LAB — KAPPA/LAMBDA LIGHT CHAINS
Kappa free light chain: 32.1 mg/L — ABNORMAL HIGH (ref 3.3–19.4)
Kappa, lambda light chain ratio: 1.3 (ref 0.26–1.65)
Lambda free light chains: 24.7 mg/L (ref 5.7–26.3)

## 2024-04-06 MED ORDER — EMPAGLIFLOZIN 10 MG PO TABS: 10.0000 mg | ORAL_TABLET | Freq: Every day | ORAL | 5 refills | Status: DC

## 2024-04-07 ENCOUNTER — Encounter (HOSPITAL_COMMUNITY): Payer: Self-pay | Admitting: Internal Medicine

## 2024-04-07 LAB — MULTIPLE MYELOMA PANEL, SERUM
Albumin SerPl Elph-Mcnc: 3.4 g/dL (ref 2.9–4.4)
Albumin/Glob SerPl: 1.1 (ref 0.7–1.7)
Alpha 1: 0.2 g/dL (ref 0.0–0.4)
Alpha2 Glob SerPl Elph-Mcnc: 0.8 g/dL (ref 0.4–1.0)
B-Globulin SerPl Elph-Mcnc: 1 g/dL (ref 0.7–1.3)
Gamma Glob SerPl Elph-Mcnc: 1.4 g/dL (ref 0.4–1.8)
Globulin, Total: 3.4 g/dL (ref 2.2–3.9)
IgA: 336 mg/dL (ref 61–437)
IgG (Immunoglobin G), Serum: 1263 mg/dL (ref 603–1613)
IgM (Immunoglobulin M), Srm: 88 mg/dL (ref 15–143)
Total Protein ELP: 6.8 g/dL (ref 6.0–8.5)

## 2024-04-08 ENCOUNTER — Ambulatory Visit (HOSPITAL_COMMUNITY): Payer: Self-pay | Admitting: Family Medicine

## 2024-04-08 ENCOUNTER — Telehealth: Payer: Self-pay

## 2024-04-08 LAB — IMMUNOFIXATION, URINE

## 2024-04-08 NOTE — Telephone Encounter (Addendum)
 Elvie from the cardiology dept at [facility] left a message to discuss this pt's TAVR referral. When I called her back, she said that the referral from his cardiologist at the [facility] (I believe it is Dr. Micky) has been added to the pt chart via Continuity of Care and that the CD of his most recent Echo was sent in the mail as of 04/07/24. If the CD does not arrive before pt appt with Dr. Barbette on 04/20/24, pt plans to pick up a copy the day prior to the appointment.

## 2024-04-11 ENCOUNTER — Telehealth: Payer: Self-pay | Admitting: Cardiovascular Disease

## 2024-04-11 NOTE — Telephone Encounter (Signed)
 Received disk/CD in mail, will place in [employer].

## 2024-04-12 ENCOUNTER — Inpatient Hospital Stay
Admission: RE | Admit: 2024-04-12 | Discharge: 2024-04-12 | Disposition: A | Discharge status: Home / Self Care | Payer: Self-pay | Source: Ambulatory Visit | Attending: Cardiovascular Disease | Admitting: Cardiovascular Disease

## 2024-04-12 ENCOUNTER — Telehealth (HOSPITAL_COMMUNITY): Payer: Self-pay

## 2024-04-12 ENCOUNTER — Other Ambulatory Visit: Payer: Self-pay

## 2024-04-12 DIAGNOSIS — I350 Nonrheumatic aortic (valve) stenosis: Secondary | ICD-10-CM

## 2024-04-12 NOTE — Telephone Encounter (Signed)
-----   Message from Olam ORN sent at 04/05/2024 10:38 AM EDT ----- Please obtain PA# for ordered AMYLOID please. :)

## 2024-04-14 ENCOUNTER — Institutional Professional Consult (permissible substitution): Admitting: Cardiovascular Disease

## 2024-04-15 ENCOUNTER — Encounter (HOSPITAL_COMMUNITY): Payer: Self-pay | Admitting: *Deleted

## 2024-04-19 ENCOUNTER — Encounter (HOSPITAL_COMMUNITY)

## 2024-04-20 ENCOUNTER — Ambulatory Visit: Admitting: Psychology

## 2024-04-20 ENCOUNTER — Ambulatory Visit: Attending: Cardiovascular Disease | Admitting: Cardiovascular Disease

## 2024-04-20 ENCOUNTER — Encounter: Payer: Self-pay | Admitting: Cardiovascular Disease

## 2024-04-20 VITALS — BP 134/86 | HR 66 | Ht 68.5 in | Wt 242.0 lb

## 2024-04-20 DIAGNOSIS — I350 Nonrheumatic aortic (valve) stenosis: Secondary | ICD-10-CM

## 2024-04-20 MED ORDER — METOPROLOL SUCCINATE ER 25 MG PO TB24: 25.0000 mg | ORAL_TABLET | Freq: Every day | ORAL | 2 refills | Status: DC

## 2024-04-20 NOTE — Patient Instructions (Addendum)
 Medication Instructions:  Your physician has recommended you make the following change in your medication:  STOP Diltiazem  RESTART Toprol  25 mg once daily  *If you need a refill on your cardiac medications before your next appointment, please call your pharmacy*  Lab Work: None ordered   Testing/Procedures: Your physician has requested that you have an echocardiogram in 3-4 months. Echocardiography is a painless test that uses sound waves to create images of your heart. It provides your doctor with information about the size and shape of your heart and how well your heart's chambers and valves are working. This procedure takes approximately one hour. There are no restrictions for this procedure. Please do NOT wear cologne, perfume, aftershave, or lotions (deodorant is allowed). Please arrive 15 minutes prior to your appointment time.  Please note: We ask at that you not bring children with you during ultrasound (echo/ vascular) testing. Due to room size and safety concerns, children are not allowed in the ultrasound rooms during exams. Our front office staff cannot provide observation of children in our lobby area while testing is being conducted. An adult accompanying a patient to their appointment will only be allowed in the ultrasound room at the discretion of the ultrasound technician under special circumstances. We apologize for any inconvenience.   Follow-Up: At [facility], you and your health needs are our priority.  As part of our continuing mission to provide you with exceptional heart care, our providers are all part of one team.  This team includes your primary Cardiologist (physician) and Advanced Practice Providers or APPs (Physician Assistants and Nurse Practitioners) who all work together to provide you with the care you need, when you need it.  Your next appointment:   After your echocardiogram    Provider:   Dr. Verlin  (please schedule in a 40 minute  structural day appointment spot)     Thank you for choosing [facility]!!   [phone]

## 2024-04-20 NOTE — Progress Notes (Signed)
 Structural Heart Clinic Consult Note  Chief Complaint  Patient presents with   New Patient (Initial Visit)    Aortic stenosis    History of Present Illness: 78 yo male with history of CAD s/p 4V CABG in 2021, LAA clipping, MAZE, chronic systolic CHF, PAF, obesity, COPD, HLD, prostate cancer and severe aortic stenosis who is here today as a new consult, referred by Dr. Bensimhon, for the evaluation of aortic stenosis and possible TAVR. He was admitted in 2021 with atrial fibrillation with RVR. Echo with LVEF=25-30%. Cardiac cath with severe left main and three vessel CAD. He underwent CABG with MAZE and LAA clipping in October 2021. LVEF improved to 40-45% post CABG. He has been followed in our Advanced Heart Failure Clinic by Dr. Cherrie. Echo at the [facility] 03/01/24 with LVEF=50-55% with moderate to severe aortic valve stenosis with peak aortic velocity 364 cm/s, mean gradient 32 mmHg, peak gradient 53 mm Hg, AVA 1.1 cm2. Moderate MR. He was recently seen in the AHF clinic and reported more dyspnea, LE edema. He has recently been started on cardizem  for atrial fib. He is now taking Eliquis .   He tells me today that he feels well overall. He has some fatigue during the day. He only began having LE edema after he was started on Cardizem . Since being on Lasix  his LE edema has resolved. No chest pain. No dyspnea. No dizziness. He lives in [place]. His daughter could not come today but she listened on his phone. He is retired from [employer].   Primary Care Physician: Kenney Clotilda PARAS, PA Primary Cardiologist: Bensimhon Referring Cardiologist: Bensimhon  Past Medical History:  Diagnosis Date   Aortic stenosis, mild    last echo in epic 05-27-2020  ef 40-45%,  mean grandiant , valve area 2.46 cm^2 ,  mild regurg   BPH with obstruction/lower urinary tract symptoms    urologist--- dr nieves   Carotid bruit present 06/15/2018   2015 Carotid Dopplers:  Essentially normal carotid arteries,  with very slight hard plaque in the proximal ICA's and serpentine distal vessels. 1-39% bilateral ICA stenosis. Patent vertebral arteries with antegrade flow. Normal subclavian arteries, bilaterally.   Chronic systolic CHF (congestive heart failure) (HCC) 05/2020   followed by dr jaclynn   COPD (chronic obstructive pulmonary disease) (HCC)    Coronary artery disease 04/2020   cardiologist--- dr cherrie;   NSTEMI /  Afib w/ RVR/  Acute CHF/  cardiogenic shock 04-20-2020;  cath 05-17-2020 multivessel disease w/ critical LM;   05-18-2020 s/p CABG x4 and Maze procedure;  nuclear stress study done [facility] 09-17-2021 no ischemia, ef 54%   Essential hypertension, benign 12/21/2008   Qualifier: Diagnosis of  By: Lavona, MD, CODY Agent     Glaucoma, both eyes    History of migraine    History of non-ST elevation myocardial infarction (NSTEMI) 05/17/2020   cardiogenic shock,  AF w/ RVR, acute CHF,  ICM;   s/p CABG w/ MAZE   History of squamous cell carcinoma of skin    History of TIA (transient ischemic attack) 02/28/2019   questionable TIA  per neurology note dr rosemarie, 04-12-2019, likely migraine   Hx of colonic polyps 01/09/2020   Hyperlipidemia    Ischemic cardiomyopathy 04/2020   per cath 09-03-20212  ef 25-30%;   last echo in epic 05-27-2020  ef 40-45%   Malignant neoplasm prostate (HCC) 10/2019   urologist--- dr nieves;  dx 03/ 2021  active survillance   Myocardial infarction (HCC)  OA (osteoarthritis)    OSA on CPAP    Diagnosed at the [facility] 2016    PAF (paroxysmal atrial fibrillation) (HCC) 04/2020   followed by cardiology---  s/p MAZE procedure 05-18-2020, takes ASA   PVC's (premature ventricular contractions)    S/P CABG x 4 05/18/2020   LIMA to LAD,  RIMA to PDA, SVG to OM1 and OM2   Seasonal allergic rhinitis    Sinus bradycardia    event monitor 01-29-2017  mild bradycardia, no pauses, rare PVCs   Vitamin B 12 deficiency    Vitamin D  deficiency 11/26/2010    Wears hearing aid in both ears     Past Surgical History:  Procedure Laterality Date   CATARACT EXTRACTION W/ INTRAOCULAR LENS IMPLANT Bilateral    CLIPPING OF ATRIAL APPENDAGE Left 05/18/2020   Procedure: CLIPPING OF ATRIAL APPENDAGE USING ATRICURE 45 MM ATRICLIP;  Surgeon: German Bartlett PEDLAR, MD;  Location: [facility];  Service: Open Heart Surgery;  Laterality: Left;   COLONOSCOPY     per pt last one approx 2019  by dr kristie   CORONARY ARTERY BYPASS GRAFT N/A 05/18/2020   Procedure: CORONARY ARTERY BYPASS GRAFTING (CABG) TIMES FOUR USING INTERNAL BILATERAL MAMMARY ARTERIES AND HARVESTED LEFT RADIAL ARTERY.;  Surgeon: German Bartlett PEDLAR, MD;  Location: [facility] OR;  Service: Open Heart Surgery;  Laterality: N/A;  CORONARY ENDARTERECTOMY PERFORMED DURING SURGERY    CYSTOSCOPY WITH URETHRAL DILATATION N/A 10/28/2022   Procedure: CYSTOSCOPY WITH BLADDER NECK DILATATION;  Surgeon: Nieves Cough, MD;  Location: [facility];  Service: Urology;  Laterality: N/A;  75 MINS   GOLD SEED IMPLANT N/A 08/26/2022   Procedure: GOLD SEED IMPLANT;  Surgeon: Devere Lonni Righter, MD;  Location: [facility];  Service: Urology;  Laterality: N/A;   IABP INSERTION N/A 05/17/2020   Procedure: IABP INSERTION;  Surgeon: Cherrie Toribio SAUNDERS, MD;  Location: [facility] INVAS[facility] CV [facility];  Service: Cardiovascular;  Laterality: N/A;   INSERTION OF SUPRAPUBIC CATHETER N/A 10/10/2022   Procedure: INSERTION OF SUPRAPUBIC CATHETER cystoscopy urethral dilation;  Surgeon: Lovie Arlyss CROME, MD;  Location: [facility];  Service: Urology;  Laterality: N/A;   KNEE ARTHROPLASTY Right 03/14/2020   Procedure: COMPUTER ASSISTED TOTAL KNEE ARTHROPLASTY;  Surgeon: Fidel Rogue, MD;  Location: [facility];  Service: Orthopedics;  Laterality: Right;   LAPAROSCOPIC CHOLECYSTECTOMY  02/26/2000   @[facility]   by dr merrilyn   MAZE N/A 05/18/2020   Procedure: MAZE USING ATRICURE ISOLATOR CLAMP OLL2;  Surgeon: German Bartlett PEDLAR, MD;  Location: [facility];  Service: Open  Heart Surgery;  Laterality: N/A;   RADIAL ARTERY HARVEST Left 05/18/2020   Procedure: RADIAL ARTERY HARVEST;  Surgeon: German Bartlett PEDLAR, MD;  [facility];  Service: Open Heart Surgery;  Laterality: Left;   RIGHT HEART CATH N/A 05/17/2020   Procedure: RIGHT HEART CATH;  Surgeon: Cherrie Toribio SAUNDERS, MD;  [facility];  Service: Cardiovascular;  Laterality: N/A;   RIGHT/LEFT HEART CATH AND CORONARY ANGIOGRAPHY N/A 05/17/2020   Procedure: RIGHT/LEFT HEART CATH AND CORONARY ANGIOGRAPHY;  Surgeon: Court Dorn PARAS, MD;  [facility];  Service: Cardiovascular;  Laterality: N/A;   SPACE OAR INSTILLATION N/A 08/26/2022   Procedure: SPACE OAR INSTILLATION;  Surgeon: Devere Lonni Righter, MD;  Location[facility];  Service: Urology;  Laterality: N/A;  30 MINS FOR CASE   TEE WITHOUT CARDIOVERSION N/A 05/18/2020   Procedure: TRANSESOPHAGEAL ECHOCARDIOGRAM (TEE);  Surgeon: German Bartlett PEDLAR, MD;  Location:  [facility];  Service: Open Heart Surgery;  Laterality: N/A;   THULIUM LASER TURP (TRANSURETHRAL RESECTION OF PROSTATE) N/A 04/15/2022   Procedure: JULIE LASER VAPORIZATION OF PROSTATE;  Surgeon: Nieves Cough, MD;  Location: [facility];  Service: Urology;  Laterality: N/A;  90 MINS FOR CASE   TONSILLECTOMY AND ADENOIDECTOMY  1952   TRANSURETHRAL RESECTION OF PROSTATE N/A 04/15/2022   TRANSURETHRAL RESECTION OF PROSTATE  10/28/2022   Procedure: TRANSURETHRAL RESECTION OF THE PROSTATE (TURP);  Surgeon: Nieves Cough, MD;  [facility];  Service: Urology;;   TRANSURETHRAL RESECTION OF PROSTATE N/A 05/19/2023   Procedure: TRANSURETHRAL RESECTION OF THE PROSTATE (TURP);  Surgeon: Nieves Cough, MD;  [facility];  Service: Urology;  Laterality: N/A;   TRANSURETHRAL RESECTION OF PROSTATE N/A 02/23/2024   Procedure: TURP (TRANSURETHRAL RESECTION OF PROSTATE);  Surgeon: Nieves Cough, MD;  [facility];  Service:  Urology;  Laterality: N/A;    Current Outpatient Medications  Medication Sig Dispense Refill   alfuzosin  (UROXATRAL ) 10 MG 24 hr tablet Take 10 mg by mouth at bedtime.     apixaban  (ELIQUIS ) 5 MG TABS tablet Take 1 tablet (5 mg total) by mouth 2 (two) times daily.     cetirizine (ZYRTEC) 10 MG tablet Take 10 mg by mouth at bedtime.     Cholecalciferol  (VITAMIN D ) 50 MCG (2000 UT) tablet Take 2,000 Units by mouth daily.     fluticasone  (FLONASE ) 50 MCG/ACT nasal spray USE TWO SPRAYS IN EACH NOSTRIL DAILY AS NEEDED. 48 mL 0   furosemide  (LASIX ) 20 MG tablet Take 1 tablet (20 mg total) by mouth daily as needed. 30 tablet 3   ketotifen (ZADITOR) 0.025 % ophthalmic solution Place 1 drop into both eyes daily as needed (allergies).     latanoprost  (XALATAN ) 0.005 % ophthalmic solution INSTILL 1 DROP INTO BOTH EYES AT BEDTIME 11.25 mL 1   metoprolol  succinate (TOPROL  XL) 25 MG 24 hr tablet Take 1 tablet (25 mg total) by mouth at bedtime. 90 tablet 2   montelukast  (SINGULAIR ) 10 MG tablet TAKE 1 TABLET BY MOUTH EVERY DAY AT BEDTIME 90 tablet 3   NON FORMULARY Cpap at night     rosuvastatin  (CRESTOR ) 20 MG tablet Take 1 tablet (20 mg total) by mouth daily. 90 tablet 3   acetaminophen  (TYLENOL ) 500 MG tablet Take 500-1,000 mg by mouth every 6 (six) hours as needed (pain.). (Patient not taking: Reported on 04/20/2024)     albuterol  (VENTOLIN  HFA) 108 (90 Base) MCG/ACT inhaler Inhale 1 puff into the lungs as needed for wheezing or shortness of breath. (Patient not taking: Reported on 04/20/2024)     aspirin  EC 81 MG tablet Take 81 mg by mouth at bedtime. Swallow whole. (Patient not taking: Reported on 04/20/2024)     empagliflozin  (JARDIANCE ) 10 MG TABS tablet Take 1 tablet (10 mg total) by mouth daily before breakfast. (Patient not taking: Reported on 04/20/2024) 30 tablet 5   losartan  (COZAAR ) 25 MG tablet TAKE 0.5 TABLETS BY MOUTH AT BEDTIME. (Patient not taking: Reported on 04/20/2024) 15 tablet 11   oxyCODONE   (OXY IR/ROXICODONE ) 5 MG immediate release tablet Take 1 tablet (5 mg total) by mouth every 6 (six) hours as needed. (Patient not taking: Reported on 04/20/2024) 6 tablet 0   sulfamethoxazole -trimethoprim  (BACTRIM ) 400-80 MG tablet Take 1 tablet by mouth at bedtime. (Patient not taking: Reported on 04/20/2024) 5 tablet 0   No current facility-administered medications for this visit.    Allergies  Allergen Reactions   Flomax  [Tamsulosin ] Other (See Comments)    dizziness   Cipro [Ciprofloxacin Hcl] Swelling    Knee swelling, joint pain   Lipitor [Atorvastatin] Other (See Comments)    Leg Cramps   Lisinopril Cough   Spironolactone  Other (See Comments)    Grew breasts     Social History   Socioeconomic History   Marital status: Married    Spouse name: Not on file   Number of children: 2   Years of education: 13   Highest education level: Not on file  Occupational History   Occupation: retired   Occupation: Retired Holiday representative  Tobacco Use   Smoking status: Former    Current packs/day: 0.00    Types: Cigarettes    Start date: 1964    Quit date: 1967    Years since quitting: 58.7    Passive exposure: Past   Smokeless tobacco: Never  Vaping Use   Vaping status: Never Used  Substance and Sexual Activity   Alcohol  use: Yes    Comment: Occasional beer   Drug use: Never   Sexual activity: Yes  Other Topics Concern   Not on file  Social History Narrative   Is a [place] veteran, has trouble sleeping most nights. Often experiences nightmares w/anesthesia meds.   Social Drivers of Corporate investment banker Strain: Low Risk  (03/05/2020)   Overall Financial Resource Strain (CARDIA)    Difficulty of Paying Living Expenses: Not hard at all  Food Insecurity: No Food Insecurity (09/13/2021)   Received from [facility]   Hunger Vital Sign    Within the past 12 months, you worried that your food would run out before you got the money to buy more.: Never true    Within the past  12 months, the food you bought just didn't last and you didn't have money to get more.: Never true  Transportation Needs: No Transportation Needs (03/05/2020)   PRAPARE - Administrator, Civil Service (Medical): No    Lack of Transportation (Non-Medical): No  Physical Activity: Unknown (03/05/2020)   Exercise Vital Sign    Days of Exercise per Week: 7 days    Minutes of Exercise per Session: Not on file  Stress: No Stress Concern Present (03/05/2020)   [employer] of Occupational Health - Occupational Stress Questionnaire    Feeling of Stress : Not at all  Social Connections: Unknown (12/27/2021)   Received from [facility]   Social Network    Social Network: Not on file  Intimate Partner Violence: Unknown (11/18/2021)   Received from [facility]   HITS    Physically Hurt: Not on file    Insult or Talk Down To: Not on file    Threaten Physical Harm: Not on file    Scream or Curse: Not on file    Family History  Problem Relation Age of Onset   Diabetes Mother    COPD Father    Stroke Paternal Grandfather     Review of Systems:  As stated in the HPI and otherwise negative.   BP 134/86   Pulse 66   Ht 5' 8.5 (1.74 m)   Wt 242 lb (109.8 kg)   SpO2 96%   BMI 36.26 kg/m   Physical Examination: General: Well developed, well nourished, NAD  HEENT: OP clear, mucus membranes moist  SKIN: warm, dry. No rashes. Neuro: No focal deficits  Musculoskeletal: Muscle strength 5/5 all ext  Psychiatric: Mood and affect normal  Neck: No JVD, no carotid bruits, no thyromegaly, no lymphadenopathy.  Lungs:Clear bilaterally, no wheezes, rhonci, crackles Cardiovascular: Regular rate and rhythm. Loud, harsh, late peaking systolic murmur.  Abdomen:Soft. Bowel sounds present. Non-tender.  Extremities: No lower extremity edema. Pulses are 2 + in the bilateral DP/PT.  EKG:  EKG is not ordered today.  Echo July 2025:  LVEF 50-55% Moderate AS-mean gradient 32 mmHg, peak  gradient 53 mmHg, AVA 1.0-1.1 cm2 Mild to moderate MR. Mild to moderate TR  Recent Labs: 02/11/2024: Hemoglobin 13.0; Platelets 241 04/05/2024: B Natriuretic Peptide 252.8; BUN 14; Creatinine, Ser 0.88; Potassium 3.7; Sodium 137    Wt Readings from Last 3 Encounters:  04/20/24 242 lb (109.8 kg)  04/05/24 243 lb 6.4 oz (110.4 kg)  02/23/24 231 lb (104.8 kg)     Assessment and Plan:   1. Severe Aortic Valve Stenosis: He has moderate to severe aortic valve stenosis. NYHA class 2 symptoms. I have personally reviewed the echo images. The aortic valve is thickened and calcified and appears to be moderate in severity with some leaflet opening noted visually. Echo measurements c/w moderate to moderately severe AS. I think he would benefit from AVR when he becomes more symptomatic. His recent worsened CHF seems to be related to atrial fib burden and initiation of Cardizem . Given advanced age and prior cardiac surgery, he is not a good candidate for conventional AVR by surgical approach. I think he may be a good candidate for TAVR.   I have reviewed the natural history of aortic stenosis with the patient and their family members  who are present today. We have discussed the limitations of medical therapy and the poor prognosis associated with symptomatic aortic stenosis. We have reviewed potential treatment options, including palliative medical therapy, conventional surgical aortic valve replacement, and transcatheter aortic valve replacement. We discussed treatment options in the context of the patient's specific comorbid medical conditions.   I have reviewed the plan in detail today with the patient and his daughter. Will plan to change Cardizem  to Toprol . Continue Lasix  as needed. I will repeat an echo in 3-4 months in our office and will see him after the echo. He will call with any change in his clinical status.      Labs/ tests ordered today include:   Orders Placed This Encounter  Procedures    ECHOCARDIOGRAM COMPLETE   Disposition:   F/U will be arranged with me after his echo in 3-4 months  Signed, [name], MD, [facility] 04/20/2024 2:15 PM    [facility] [address] [place], [place], [place]  [address] Phone: [phone]; Fax: [phone]

## 2024-04-27 ENCOUNTER — Other Ambulatory Visit (HOSPITAL_COMMUNITY): Payer: Self-pay | Admitting: Family Medicine

## 2024-04-27 ENCOUNTER — Ambulatory Visit: Admitting: Psychology

## 2024-04-27 DIAGNOSIS — I5022 Chronic systolic (congestive) heart failure: Secondary | ICD-10-CM

## 2024-04-28 ENCOUNTER — Ambulatory Visit (HOSPITAL_COMMUNITY)
Admission: RE | Admit: 2024-04-28 | Discharge: 2024-04-28 | Disposition: A | Discharge status: Home / Self Care | Source: Ambulatory Visit | Attending: Cardiology | Admitting: Cardiology

## 2024-04-28 DIAGNOSIS — I5022 Chronic systolic (congestive) heart failure: Secondary | ICD-10-CM

## 2024-04-28 LAB — MYOCARDIAL AMYLOID PLANAR & SPECT: H/CL Ratio: 1.21

## 2024-04-28 MED ORDER — TECHNETIUM TC 99M PYROPHOSPHATE
21.8000 | Freq: Once | INTRAVENOUS | Status: AC
  Administered 2024-04-28: 21.8 via INTRAVENOUS

## 2024-04-29 ENCOUNTER — Other Ambulatory Visit (HOSPITAL_BASED_OUTPATIENT_CLINIC_OR_DEPARTMENT_OTHER): Payer: Self-pay

## 2024-05-02 ENCOUNTER — Encounter (HOSPITAL_COMMUNITY): Payer: Self-pay | Admitting: Internal Medicine

## 2024-05-03 NOTE — Progress Notes (Signed)
 Spoke with his daughter. Have pharmacy visit scheduled for 05/10/24.

## 2024-05-03 NOTE — Progress Notes (Incomplete)
 ***In Progress***    Advanced Heart Failure Clinic Note   PCP: Kenney Clotilda PARAS, PA [facility] Cardiologist: Dr. Micky HF Cardiologist: Dr. Cherrie   HPI:  Eric Stokes is a 78 y.o. with obesity, HTN, OSA on CPAP, HL, prostate CA, ? Asthma vs COPD, PAF, CAD s/p CABG 05/18/20 and systolic HF.    Echo in 2020 that showed EF 60-65% with normal RV.    Admitted 05/2020 with chest pain and shortness of breath. In the ED was in A fib RVR and had ST depression. HS Trop 8318>[id number]. Cath showed severe multivessel disease with elevated filling pressures. Echo EF 25-30% with mild AS. IABP placed. Underwent CABG 05/18/20 with Maze and LAA clipping. LIMA -> LAD, RIMA  -> PDA. Left radial OM1-> OM2. Post CABG course was slow but relatively uneventful. Repeat echo 10/10 and showed EF improved 40-45% normal RV. Discharged home on 05/30/20.    Several med intolerances. Bisoprolol  stopped due to low BP. Developed gynecomastia w/ spiro and started on eplerenone . [name] stopped due recurrent UTIs. Off Eliquis  due to hematuria.   Nuclear stress test at [facility] (09/2021) showed no ischemia, EF 54%.   Follow up 09/2021, had recent exertional dyspnea. Had a few low BP readings w/ dizziness. No med changes.   S/p TURP 03/2022.   Seen in ED 04/24/22 with dizziness and SOB. Found to have UTI. D dimer +, CTA negative. Treated with IV ceftriaxone .    He stopped his Inspra  on his own due to frequent urination with resolution of symptoms.     S/p gold seed implant at [facility] (08/2022), no anesthesia complications.   S/p insertion of suprapubic catheter (09/2022), no anesthesia complications. S/p cystoscopy with bladder neck dilation, TURP 10/2022.   Echo 02/2023 EF 50%, moderate LVH, G2DD, RV ok, moderate TR, AoV mean gradient increased 11--> 28 mmHg   Underwent TURP residual 05/19/23. No anesthesia complications.   14 day holter at [facility] 07/2023: SR with 1% Afib burden (longest episode 10 min), 10.5% PACs, 6% PVCs   S/p TURP  02/23/24   Echo 03/01/24 (at [facility]): EF 50-55%, diastolic function not well-visualized, mild LVH, moderate to severe AS, peak aortic valve velocity 364 cm/s, AVA 1.0-1.1 cm2, moderate Eric and TR, RVSP 25 mmHg   Returned to AHF Clinic for HF follow up 04/05/24. Overall was feeling fair. He had been more SOB for past couple of months. He had SOB working in the garden or walking up inclines. No CP or syncope. Saw Cards at [facility] the previous week, was started on diltiazem  for AF. Legs had been swelling. 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 He noted occasional dizziness. Denied palpitations, abnormal bleeding, or PND/Orthopnea. Appetite was ok. Weight at home was 231 pounds. Previously worked in [employer] representative for 43 years. Wears CPAP. Multiple medication intolerances. At that visit,  jardiance  10 mg daily was retrialed (had failed previously due to UTIs. Zio x2 weeks placed to quantify burden.  He was also referred to Dr. Verlin in structural heart clinic to evaluate aortic stenosis. Was noted to have moderate to severe aortic valve stenosis, NYHA class II symptoms. It was noted his recent worsened CHF seems to be related to AF burden and initiation of diltiazem . It was noted that given his advanced age and prior cardiac surgery, he would not be a good candidate for conventional AVR by surgical approach but may be a good candidate for TAVR. At that visit, diltiazem  was stopped and metoprolol   succinate 25 mg daily was initiated. Plan to repeat echo in 3-4 months and then be seen for follow up.  Today he returns to HF clinic for pharmacist medication titration. Spoke with daughter on phone before visit. Notes he is only taking 1/4 dose of metoprolol  succinate due to joint pain. Of note, he had experienced joint pain in the past with metoprolol , but until it was restarted, had not been sure it was that medication or related to something else. Also stopped Jardiance  given  history of UTIs.   Shortness of breath/dyspnea on exertion? {YES NO:22349}  Orthopnea/PND? {YES NO:22349} Edema? {YES NO:22349} Lightheadedness/dizziness? {YES NO:22349} Daily weights at home? {YES NO:22349} Blood pressure/heart rate monitoring at home? {YES [id number] Following low-sodium/fluid-restricted diet? {YES NO:22349}  HF Medications:   Has the patient been experiencing any side effects to the medications prescribed?  {YES NO:22349}  Does the patient have any problems obtaining medications due to transportation or finances?   {YES NO:22349}  Understanding of regimen: {excellent/good/fair/poor:19665} Understanding of indications: {excellent/good/fair/poor:19665} Potential of compliance: {excellent/good/fair/poor:19665} Patient understands to avoid NSAIDs. Patient understands to avoid decongestants.    Pertinent Lab Values: Serum creatinine ***, BUN ***, Potassium ***, Sodium ***, BNP ***, Magnesium  ***, Digoxin  ***   Vital Signs: Weight: *** (last clinic weight: ***) Blood pressure: ***  Heart rate: ***   Assessment/Plan: 1. CAD/NSTEMI with critcal LM disease - LHC 04/2020 with severe multivessel disease.  - s/p CABG 05/18/20 with Maze and LAA clipping. LIMA -> LAD, RIMA  -> PDA. Left radial OM1-> OM2 - NST (09/2021) at [facility] negative for ischemia  - No chest pain.  - Continue ASA 81 mg daily. - Continue rosuvastatin  10 mg daily    2. Chronic Systolic Heart Failure due to ischemic CM - Echo (2020): EF 60-65%.  - Echo (9/21): EF 25-30%. RV normal. Mild AS   - Echo (10/21): EF improved 40-45% normal RV - Nuclear Stress test at [facility] 2/23 w/ EF 54%  - Echo (7/24 at [facility] EF 50%, G2DD, RV normal, moderate AS - Echo (7/25 at [facility]): EF 50-55%, diastolic function not well-visualized, mild LVH, moderate to severe AS. - PYP scan and SPEP/UPEP IFE + K/L free light chain labs negative for ATTR and AL amyloid.  - Worse NYHA IIb-III, volume up today. ReDs 46% (body  habitus also contributing to elevated reading). Suspect AF/PVCs and valvular disease contributing to volume.*** FYI volume improved  at structural heart f/u w/ PRN lsx - Multiple medication intolerances, GDMT has been limited by orthostasis as well - Continue Lasix  20 mg PRN. Use compression hose as needed - Failed Farxiga and Jardiance  with history of UTIs - He stopped losartan  & Inspra .   3. PAF  - s/p MAZE w/ LAA Clip  - Holter monitor 07/2023: SR with 1% Afib burden longest episode 10 minutes, 6% PVCs, 10% PACs. - Previous off Eliquis  2/2 hematuria, he is unsure if he wants to restart. Strongly advised by APP last visit that he restart Eliquis  5 mg BID, instructed to stop ASA if he restarts AC. *** -No longer on diltiazem , discontinued by Dr. Verlin. Was placed on metoprolol  25 mg daily *** - ECG from [place] reviewed on 04/01/24 with APP, appearred AF with RVR, HR 115, with polymorphic PVCs   4. PVCs - Frequent on ECG 04/01/24 - Asymptomatic -No longer on diltiazem , discontinued by Dr. Verlin. Was placed on metoprolol  25 mg daily *** - Place 2 week Zio to quantify burden*** - Continue CPAP  5. Aortic Stenosis - Echo (02/2023) with moderate AS, AVA 0.9 cm2, dimensionless index 0.28, mean gradient 28 mmHg - Echo 02/2024 at [facility]: moderate to severe AS, peak aortic valve velocity 364 cm/s, AVA 1.0-1.1 cm2, moderate Eric and TR, RVSP 25 mmHg - With worsening symptoms, was referred to Structural Heart Team for TAVR eval. Saw Dr. Verlin 04/20/24. May be good TAVR candidate per notes. Plan to reassess after follow up echo in 3-4 months.    6. H/o Prostate Cancer - Continue alfuzosin . - S/p TURP X 4.  Follow up ***   Tinnie Redman, PharmD, BCPS, BCCP, CPP Heart Failure Clinic Pharmacist [phone]

## 2024-05-06 ENCOUNTER — Ambulatory Visit (HOSPITAL_COMMUNITY): Payer: Self-pay | Admitting: Family Medicine

## 2024-05-09 ENCOUNTER — Encounter: Payer: Self-pay | Admitting: Cardiovascular Disease

## 2024-05-10 ENCOUNTER — Other Ambulatory Visit (HOSPITAL_COMMUNITY)

## 2024-05-11 ENCOUNTER — Ambulatory Visit: Admitting: Psychology

## 2024-05-11 NOTE — Progress Notes (Incomplete)
 ***In Progress***    Advanced Heart Failure Clinic Note   PCP: Kenney Clotilda PARAS, PA [facility] Cardiologist: Dr. Micky HF Cardiologist: Dr. Cherrie   HPI:  Eric Stokes is a 78 y.o. with obesity, HTN, OSA on CPAP, HL, prostate CA, ? Asthma vs COPD, PAF, CAD s/p CABG 05/18/20 and systolic HF.    Echo in 2020 that showed EF 60-65% with normal RV.    Admitted 05/2020 with chest pain and shortness of breath. In the ED was in A fib RVR and had ST depression. HS Trop 8318>[id number]. Cath showed severe multivessel disease with elevated filling pressures. Echo EF 25-30% with mild AS. IABP placed. Underwent CABG 05/18/20 with Maze and LAA clipping. LIMA -> LAD, RIMA  -> PDA. Left radial OM1-> OM2. Post CABG course was slow but relatively uneventful. Repeat echo 10/10 and showed EF improved 40-45% normal RV. Discharged home on 05/30/20.    Several med intolerances. Bisoprolol  stopped due to low BP. Developed gynecomastia w/ spiro and started on eplerenone . [name] stopped due recurrent UTIs. Off Eliquis  due to hematuria.   Nuclear stress test at [facility] (09/2021) showed no ischemia, EF 54%.   Follow up 09/2021, had recent exertional dyspnea. Had a few low BP readings w/ dizziness. No med changes.   S/p TURP 03/2022.   Seen in ED 04/24/22 with dizziness and SOB. Found to have UTI. D dimer +, CTA negative. Treated with IV ceftriaxone .    He stopped his Inspra  on his own due to frequent urination with resolution of symptoms.     S/p gold seed implant at [facility] (08/2022), no anesthesia complications.   S/p insertion of suprapubic catheter (09/2022), no anesthesia complications. S/p cystoscopy with bladder neck dilation, TURP 10/2022.   Echo 02/2023 EF 50%, moderate LVH, G2DD, RV ok, moderate TR, AoV mean gradient increased 11--> 28 mmHg   Underwent TURP residual 05/19/23. No anesthesia complications.   14 day holter at [facility] 07/2023: SR with 1% Afib burden (longest episode 10 min), 10.5% PACs, 6% PVCs   S/p TURP  02/23/24   Echo 03/01/24 (at [facility]): EF 50-55%, diastolic function not well-visualized, mild LVH, moderate to severe AS, peak aortic valve velocity 364 cm/s, AVA 1.0-1.1 cm2, moderate Eric and TR, RVSP 25 mmHg   Returned to AHF Clinic for HF follow up 04/05/24. Overall was feeling fair. He had been more SOB for past couple of months. He had SOB working in the garden or walking up inclines. No CP or syncope. Saw Cards at [facility] the previous week, was started on diltiazem  for AF. Legs had been swelling. 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 He noted occasional dizziness. Denied palpitations, abnormal bleeding, or PND/Orthopnea. Appetite was ok. Weight at home was 231 pounds. Previously worked in [employer] representative for 43 years. Wears CPAP. Multiple medication intolerances. At that visit,  jardiance  10 mg daily was retrialed (had failed previously due to UTIs. Zio x2 weeks placed to quantify burden.  He was also referred to Dr. Verlin in structural heart clinic to evaluate aortic stenosis. Was noted to have moderate to severe aortic valve stenosis, NYHA class II symptoms. It was noted his recent worsened CHF seems to be related to AF burden and initiation of diltiazem . It was noted that given his advanced age and prior cardiac surgery, he would not be a good candidate for conventional AVR by surgical approach but may be a good candidate for TAVR. At that visit, diltiazem  was stopped and metoprolol   succinate 25 mg daily was initiated. Plan to repeat echo in 3-4 months and then be seen for follow up.  Today he returns to HF clinic for pharmacist medication titration. Spoke with daughter on phone before visit. Notes he is only taking 1/4 dose of metoprolol  succinate due to joint pain. Of note, he had experienced joint pain in the past with metoprolol , but until it was restarted, had not been sure it was that medication or related to something else. Also stopped Jardiance  given  history of UTIs.   Shortness of breath/dyspnea on exertion? {YES NO:22349}  Orthopnea/PND? {YES NO:22349} Edema? {YES NO:22349} Lightheadedness/dizziness? {YES NO:22349} Daily weights at home? {YES NO:22349} Blood pressure/heart rate monitoring at home? {YES [id number] Following low-sodium/fluid-restricted diet? {YES NO:22349}  HF Medications:   Has the patient been experiencing any side effects to the medications prescribed?  {YES NO:22349}  Does the patient have any problems obtaining medications due to transportation or finances?   {YES NO:22349}  Understanding of regimen: {excellent/good/fair/poor:19665} Understanding of indications: {excellent/good/fair/poor:19665} Potential of compliance: {excellent/good/fair/poor:19665} Patient understands to avoid NSAIDs. Patient understands to avoid decongestants.    Pertinent Lab Values: Serum creatinine ***, BUN ***, Potassium ***, Sodium ***, BNP ***, Magnesium  ***, Digoxin  ***   Vital Signs: Weight: *** (last clinic weight: ***) Blood pressure: ***  Heart rate: ***   Assessment/Plan: 1. CAD/NSTEMI with critcal LM disease - LHC 04/2020 with severe multivessel disease.  - s/p CABG 05/18/20 with Maze and LAA clipping. LIMA -> LAD, RIMA  -> PDA. Left radial OM1-> OM2 - NST (09/2021) at [facility] negative for ischemia  - No chest pain.  - Continue ASA 81 mg daily. - Continue rosuvastatin  10 mg daily    2. Chronic Systolic Heart Failure due to ischemic CM - Echo (2020): EF 60-65%.  - Echo (9/21): EF 25-30%. RV normal. Mild AS   - Echo (10/21): EF improved 40-45% normal RV - Nuclear Stress test at [facility] 2/23 w/ EF 54%  - Echo (7/24 at [facility] EF 50%, G2DD, RV normal, moderate AS - Echo (7/25 at [facility]): EF 50-55%, diastolic function not well-visualized, mild LVH, moderate to severe AS. - PYP scan and SPEP/UPEP IFE + K/L free light chain labs negative for ATTR and AL amyloid.  - Worse NYHA IIb-III, volume up today. ReDs 46% (body  habitus also contributing to elevated reading). Suspect AF/PVCs and valvular disease contributing to volume.*** FYI volume improved  at structural heart f/u w/ PRN lsx - Multiple medication intolerances, GDMT has been limited by orthostasis as well - Continue Lasix  20 mg PRN. Use compression hose as needed - Failed Farxiga and Jardiance  with history of UTIs - He stopped losartan  & Inspra .   3. PAF  - s/p MAZE w/ LAA Clip  - Holter monitor 07/2023: SR with 1% Afib burden longest episode 10 minutes, 6% PVCs, 10% PACs. - Previous off Eliquis  2/2 hematuria, he is unsure if he wants to restart. Strongly advised by APP last visit that he restart Eliquis  5 mg BID, instructed to stop ASA if he restarts AC. *** -No longer on diltiazem , discontinued by Dr. Verlin. Was placed on metoprolol  25 mg daily *** - ECG from [place] reviewed on 04/01/24 with APP, appearred AF with RVR, HR 115, with polymorphic PVCs   4. PVCs - Frequent on ECG 04/01/24 - Asymptomatic -No longer on diltiazem , discontinued by Dr. Verlin. Was placed on metoprolol  25 mg daily *** - Place 2 week Zio to quantify burden*** - Continue CPAP  5. Aortic Stenosis - Echo (02/2023) with moderate AS, AVA 0.9 cm2, dimensionless index 0.28, mean gradient 28 mmHg - Echo 02/2024 at [facility]: moderate to severe AS, peak aortic valve velocity 364 cm/s, AVA 1.0-1.1 cm2, moderate Eric and TR, RVSP 25 mmHg - With worsening symptoms, was referred to Structural Heart Team for TAVR eval. Saw Dr. Verlin 04/20/24. May be good TAVR candidate per notes. Plan to reassess after follow up echo in 3-4 months.    6. H/o Prostate Cancer - Continue alfuzosin . - S/p TURP X 4.  Follow up ***   Tinnie Redman, PharmD, BCPS, BCCP, CPP Heart Failure Clinic Pharmacist [phone]

## 2024-05-16 ENCOUNTER — Other Ambulatory Visit (HOSPITAL_BASED_OUTPATIENT_CLINIC_OR_DEPARTMENT_OTHER): Payer: Self-pay

## 2024-05-16 NOTE — Progress Notes (Incomplete)
 ***In Progress***    Advanced Heart Failure Clinic Note   PCP: Kenney Clotilda [place]AS, [place] [facility] Cardiologist: Dr. Micky HF Cardiologist: Dr. Cherrie   HPI:  Eric Stokes is a 78 y.o. with obesity, HTN, OSA on CPAP, HL, prostate CA, ? Asthma vs COPD, PAF, CAD s/p CABG 05/18/20 and systolic HF.    Echo in 2020 that showed EF 60-65% with normal RV.    Admitted 05/2020 with chest pain and shortness of breath. In the ED was in A fib RVR and had ST depression. HS Trop [id number]. Cath showed severe multivessel disease with elevated filling pressures. Echo EF 25-30% with mild AS. IABP placed. Underwent CABG 05/18/20 with Maze and LAA clipping. LIMA -> LAD, RIMA  -> PDA. Left radial OM1-> OM2. Post CABG course was slow but relatively uneventful. Repeat echo 10/10 and showed EF improved 40-45% normal RV. Discharged home on 05/30/20.    Several med intolerances. Bisoprolol  stopped due to low BP. Developed gynecomastia w/ spiro and started on eplerenone . [name] stopped due recurrent UTIs. Off Eliquis  due to hematuria.   Nuclear stress test at [facility] (09/2021) showed no ischemia, EF 54%.   Follow up 09/2021, had recent exertional dyspnea. Had a few low BP readings w/ dizziness. No med changes.   S/p TURP 03/2022.   Seen in ED 04/24/22 with dizziness and SOB. Found to have UTI. D dimer +, CTA negative. Treated with IV ceftriaxone .    He stopped his Inspra  on his own due to frequent urination with resolution of symptoms.     S/p gold seed implant at [facility] (08/2022), no anesthesia complications.   S/p insertion of suprapubic catheter (09/2022), no anesthesia complications. S/p cystoscopy with bladder neck dilation, TURP 10/2022.   Echo 02/2023 EF 50%, moderate LVH, G2DD, RV ok, moderate TR, AoV mean gradient increased 11--> 28 mmHg   Underwent TURP residual 05/19/23. No anesthesia complications.   14 day holter at [facility] 07/2023: SR with 1% Afib burden (longest episode 10 min), 10.5% PACs, 6% PVCs   S/p TURP  02/23/24   Echo 03/01/24 (at [facility]): EF 50-55%, diastolic function not well-visualized, mild LVH, moderate to severe AS, peak aortic valve velocity 364 cm/s, AVA 1.0-1.1 cm2, moderate Eric and TR, RVSP 25 mmHg   Returned to AHF Clinic for HF follow up 04/05/24. Overall was feeling fair. He had been more SOB for past couple of months. He had SOB working in the garden or walking up inclines. No CP or syncope. Saw Cards at [facility] the previous week, was started on diltiazem  for AF. Legs had been swelling. 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 He noted occasional dizziness. Denied palpitations, abnormal bleeding, or PND/Orthopnea. Appetite was ok. Weight at home was 231 pounds. Previously worked in [employer] for 43 years. Wears CPAP. Multiple medication intolerances. At that visit,  jardiance  10 mg daily was retrialed (had failed previously due to UTIs). Zio x2 weeks placed to quantify burden.  He was also referred to Dr. Verlin in structural heart clinic to evaluate aortic stenosis. Was noted to have moderate to severe aortic valve stenosis, NYHA class II symptoms. It was noted his recent worsened CHF seems to be related to AF burden and initiation of diltiazem . It was noted that given his advanced age and prior cardiac surgery, he would not be a good candidate for conventional AVR by surgical approach but may be a good candidate for TAVR. At that visit, diltiazem  was stopped and metoprolol   succinate 25 mg daily was initiated. Plan to repeat echo in 3-4 months and then be seen for follow up.  Today he returns to HF clinic for pharmacist medication titration. Spoke with daughter on phone before visit. Notes he is only taking 1/4 dose of metoprolol  succinate due to joint pain. Of note, he had experienced joint pain in the past with metoprolol , but until it was restarted, had not been sure it was that medication or related to something else. Also stopped Jardiance  given  history of UTIs.   Shortness of breath/dyspnea on exertion? {YES NO:22349}  Orthopnea/PND? {YES NO:22349} Edema? {YES NO:22349} Lightheadedness/dizziness? {YES NO:22349} Daily weights at home? {YES NO:22349} Blood pressure/heart rate monitoring at home? {YES [id number] Following low-sodium/fluid-restricted diet? {YES NO:22349}  HF Medications: Metoprolol  *** Furosemide  PRN***  Has the patient been experiencing any side effects to the medications prescribed?  {YES NO:22349}  Does the patient have any problems obtaining medications due to transportation or finances?   {YES NO:22349}  Understanding of regimen: {excellent/good/fair/poor:19665} Understanding of indications: {excellent/good/fair/poor:19665} Potential of compliance: {excellent/good/fair/poor:19665} Patient understands to avoid NSAIDs. Patient understands to avoid decongestants.    Pertinent Lab Values: (04/05/24) Serum creatinine 0.88 mg/dL, BUN 14 mg/dL, Potassium 3.7 mmol/L, Sodium 137 mmol/L, BNP 252 pg/mL  Vital Signs: Weight: *** (last clinic weight: 243 lbs) Blood pressure: *** (last clinic BP: 140/62 mmHg)*** Heart rate: *** (last clinic HR: 56 bpm)***  Assessment/Plan: 1. CAD/NSTEMI with critcal LM disease - LHC 04/2020 with severe multivessel disease.  - s/p CABG 05/18/20 with Maze and LAA clipping. LIMA -> LAD, RIMA  -> PDA. Left radial OM1-> OM2 - NST (09/2021) at [facility] negative for ischemia  - No chest pain.  - Continue ASA 81 mg daily. - Continue rosuvastatin  10 mg daily    2. Chronic Systolic Heart Failure due to ischemic CM - Echo (2020): EF 60-65%.  - Echo (9/21): EF 25-30%. RV normal. Mild AS   - Echo (10/21): EF improved 40-45% normal RV - Nuclear Stress test at [facility] 2/23 w/ EF 54%  - Echo (7/24 at [facility]): EF 50%, G2DD, RV normal, moderate AS - Echo (7/25 at [facility]): EF 50-55%, diastolic function not well-visualized, mild LVH, moderate to severe AS. - PYP scan and SPEP/UPEP IFE + K/L free  light chain labs negative for ATTR and AL amyloid.  - Worse NYHA IIb-III, volume up today. ReDs 46% (body habitus also contributing to elevated reading). Suspect AF/PVCs and valvular disease contributing to volume.*** FYI volume improved  at structural heart f/u w/ PRN lsx - Multiple medication intolerances, GDMT has been limited by orthostasis as well - Continue Lasix  20 mg PRN. Use compression hose as needed - Failed Farxiga and Jardiance  with history of UTIs - He stopped losartan  & Inspra .   3. PAF  - s/p MAZE w/ LAA Clip  - Holter monitor 07/2023: SR with 1% Afib burden longest episode 10 minutes, 6% PVCs, 10% PACs. - Previous off Eliquis  2/2 hematuria, he is unsure if he wants to restart. Strongly advised by APP last visit that he restart Eliquis  5 mg BID, instructed to stop ASA if he restarts AC. *** -No longer on diltiazem , discontinued by Dr. Verlin. Was placed on metoprolol  25 mg daily *** - ECG from [facility] reviewed on 04/01/24 with APP, appearred AF with RVR, HR 115, with polymorphic PVCs   4. PVCs - Frequent on ECG 04/01/24 - Asymptomatic -No longer on diltiazem , discontinued by Dr. Verlin. Was placed on metoprolol  25 mg daily *** -  Place 2 week Zio to quantify burden*** - Continue CPAP   5. Aortic Stenosis - Echo (02/2023) with moderate AS, AVA 0.9 cm2, dimensionless index 0.28, mean gradient 28 mmHg - Echo 02/2024 at [facility]: moderate to severe AS, peak aortic valve velocity 364 cm/s, AVA 1.0-1.1 cm2, moderate Eric and TR, RVSP 25 mmHg - With worsening symptoms, was referred to Structural Heart Team for TAVR eval. Saw Dr. Verlin 04/20/24. May be good TAVR candidate per notes. Plan to reassess after follow up echo in 3-4 months.    6. H/o Prostate Cancer - Continue alfuzosin . - S/p TURP X 4.  Follow up ***  Morna Breach, PharmD PGY2 Cardiology Pharmacy Resident

## 2024-05-23 ENCOUNTER — Ambulatory Visit (HOSPITAL_COMMUNITY)
Admission: RE | Admit: 2024-05-23 | Discharge: 2024-05-23 | Disposition: A | Discharge status: Home / Self Care | Source: Ambulatory Visit | Attending: Cardiology | Admitting: Cardiology

## 2024-05-23 VITALS — BP 132/76 | HR 67 | Wt 241.0 lb

## 2024-05-23 DIAGNOSIS — I110 Hypertensive heart disease with heart failure: Secondary | ICD-10-CM | POA: Insufficient documentation

## 2024-05-23 DIAGNOSIS — E785 Hyperlipidemia, unspecified: Secondary | ICD-10-CM | POA: Insufficient documentation

## 2024-05-23 DIAGNOSIS — Z951 Presence of aortocoronary bypass graft: Secondary | ICD-10-CM | POA: Insufficient documentation

## 2024-05-23 DIAGNOSIS — I2510 Atherosclerotic heart disease of native coronary artery without angina pectoris: Secondary | ICD-10-CM | POA: Insufficient documentation

## 2024-05-23 DIAGNOSIS — G4733 Obstructive sleep apnea (adult) (pediatric): Secondary | ICD-10-CM | POA: Insufficient documentation

## 2024-05-23 DIAGNOSIS — Z7901 Long term (current) use of anticoagulants: Secondary | ICD-10-CM | POA: Insufficient documentation

## 2024-05-23 DIAGNOSIS — I5020 Unspecified systolic (congestive) heart failure: Secondary | ICD-10-CM

## 2024-05-23 DIAGNOSIS — I350 Nonrheumatic aortic (valve) stenosis: Secondary | ICD-10-CM | POA: Diagnosis not present

## 2024-05-23 DIAGNOSIS — I480 Paroxysmal atrial fibrillation: Secondary | ICD-10-CM | POA: Diagnosis not present

## 2024-05-23 DIAGNOSIS — Z79899 Other long term (current) drug therapy: Secondary | ICD-10-CM | POA: Insufficient documentation

## 2024-05-23 DIAGNOSIS — E669 Obesity, unspecified: Secondary | ICD-10-CM | POA: Diagnosis not present

## 2024-05-23 DIAGNOSIS — Z8546 Personal history of malignant neoplasm of prostate: Secondary | ICD-10-CM | POA: Diagnosis not present

## 2024-05-23 DIAGNOSIS — I255 Ischemic cardiomyopathy: Secondary | ICD-10-CM | POA: Insufficient documentation

## 2024-05-23 DIAGNOSIS — I252 Old myocardial infarction: Secondary | ICD-10-CM | POA: Insufficient documentation

## 2024-05-23 DIAGNOSIS — I5022 Chronic systolic (congestive) heart failure: Secondary | ICD-10-CM | POA: Diagnosis not present

## 2024-05-23 DIAGNOSIS — I493 Ventricular premature depolarization: Secondary | ICD-10-CM | POA: Diagnosis not present

## 2024-05-23 MED ORDER — NITROGLYCERIN 0.4 MG SL SUBL: 0.4000 mg | SUBLINGUAL_TABLET | SUBLINGUAL | 3 refills | Status: AC | PRN

## 2024-05-23 MED ORDER — METOPROLOL SUCCINATE ER 50 MG PO TB24: 50.0000 mg | ORAL_TABLET | Freq: Every day | ORAL | 3 refills | Status: AC

## 2024-05-23 NOTE — Patient Instructions (Signed)
 It was a pleasure seeing you today!  MEDICATIONS: -No medication changes today -Continue metoprolol  succinate 50 mg daily -Call if you have questions about your medications.  LABS: -We will call you if your labs need attention.  NEXT APPOINTMENT: Return to clinic in 1 week with Dr. Bensimhon.  In general, to take care of your heart failure: -Limit your fluid intake to 2 Liters (half-gallon) per day.   -Limit your salt intake to ideally 2-3 grams (2000-3000 mg) per day. -Weigh yourself daily and record, and bring that weight diary to your next appointment.  (Weight gain of 2-3 pounds in 1 day typically means fluid weight.) -The medications for your heart are to help your heart and help you live longer.   -Please contact us  before stopping any of your heart medications.  Call the clinic at [phone] with questions or to reschedule future appointments.

## 2024-05-23 NOTE — Progress Notes (Signed)
 Advanced Heart Failure Clinic Note   PCP: Kenney Clotilda PARAS, [facility] Cardiologist: Dr. Micky HF Cardiologist: Dr. Cherrie   HPI:  Mr Vanduyne is a 78 y.o. with obesity, HTN, OSA on CPAP, HL, prostate CA, ? Asthma vs COPD, PAF, CAD s/p CABG 05/18/20 and systolic HF.    Echo in 2020 that showed EF 60-65% with normal RV.    Admitted 05/2020 with chest pain and shortness of breath. In the ED was in A fib RVR and had ST depression. HS Trop 8318>1955. Cath showed severe multivessel disease with elevated filling pressures. Echo EF 25-30% with mild AS. IABP placed. Underwent CABG 05/18/20 with Maze and LAA clipping. LIMA -> LAD, RIMA  -> PDA. Left radial OM1-> OM2. Post CABG course was slow but relatively uneventful. Repeat echo 10/10 and showed EF improved 40-45% normal RV. Discharged home on 05/30/20.    Several med intolerances. Bisoprolol  stopped due to low BP. Developed gynecomastia w/ spironolactone  and started on eplerenone . Farxiga stopped due recurrent UTIs. Off Eliquis  due to hematuria. He later stopped his eplerenone  on his own due to frequent urination with resolution of symptoms.     14 day holter at [facility] 07/2023: SR with 1% Afib burden (longest episode 10 min), 10.5% PACs, 6% PVCs   Echo 03/01/24 (at [facility]): EF 50-55%, diastolic function not well-visualized, mild LVH, moderate to severe AS, peak aortic valve velocity 364 cm/s, AVA 1.0-1.1 cm2, moderate MR and TR, RVSP 25 mmHg   Returned to AHF Clinic for HF follow up 04/05/24. Overall was feeling fair. He had been more SOB for past couple of months. He had SOB working in the garden or walking up inclines. No CP or syncope. Saw Cards at [facility] the previous week, was started on diltiazem  for AF. Legs had been swelling. 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 He noted occasional dizziness. Denied palpitations, abnormal bleeding, or PND/Orthopnea. Appetite was ok. Weight at home was 231 pounds.  Previously worked in [employer] for 43 years. Wears CPAP. Multiple medication intolerances. At that visit,  Jardiance  10 mg daily was retrialed (had failed previously due to UTIs). Zio x2 weeks placed to quantify burden.  He was also referred to Dr. Verlin in structural heart clinic to evaluate aortic stenosis. Was noted to have moderate to severe aortic valve stenosis, NYHA class II symptoms. It was noted his recent worsened CHF seems to be related to AF burden and initiation of diltiazem . It was noted that given his advanced age and prior cardiac surgery, he would not be a good candidate for conventional AVR by surgical approach but may be a good candidate for TAVR. At that visit, diltiazem  was stopped and metoprolol  succinate 25 mg daily was initiated. Plan to repeat echo in 3-4 months and then be seen for follow up.  Today he returns to HF clinic with his daughter for pharmacist medication titration. Spoke with daughter on phone before visit. Reviewed and updated patient's medication list. He reports slowly increasing the dose of his metoprolol , was previously prescribed 100 mg tablets. He reports previously taking 1/4 dose (25 mg), but began taking 1/2 dose (50 mg) on 05/19/24. Of note, had experienced joint pain in the past with metoprolol , but until it was restarted, had not been sure it was that medication or related to something else. Jardiance  was stopped due to recurrent UTIs, retrialed, and stopped again due to repeat UTI. Unable to tolerate spironolactone  due to gynecomastia. Patient  reports previously taking losartan  but does not recall why it were discontinued. Eplerenone  was stopped for frequent urination, though he several other potential causes for polyuria. Noted he has a history of prostate cancer and still has urinary symptoms of frequency and urgency that limit his ADLs. Noted patient has a history with hypotension, reports being symptomatic when SBP <110 mmHg. Reports feeling dizzy all  the time but denies lightheadedness or orthostasis. Checks BP regularly at home, typically about 135/75-80 mmHg. Denies symptoms of palpitations, reports chest pain that is related to emotional stress. Reports SOB only when walking up hills, denies symptoms at rest. Reports 12 lb weight gain in past few months, noted some weight gain may be due to emotional stress.  HF Medications: Metoprolol  succinate 50 mg daily Furosemide  20 mg PRN - no doses since 04/18/24  Has the patient been experiencing any side effects to the medications prescribed?  Significant side effects with GDMT, see above comments. Allergy/Intolerance list updated.  Does the patient have any problems obtaining medications due to transportation or finances?   No, patient fills medications with the [facility].  Understanding of regimen: excellent Understanding of indications: good Potential of compliance: good Patient understands to avoid NSAIDs. Patient understands to avoid decongestants.  Pertinent Lab Values: (04/05/24) Serum creatinine 0.88 mg/dL, BUN 14 mg/dL, Potassium 3.7 mmol/L, Sodium 137 mmol/L, BNP 252 pg/mL  Vital Signs: Weight: 241 lbs (last clinic weight: 243 lbs) Blood pressure: 132/76 mmHg (last clinic BP: 140/62 mmHg) Heart rate: 67 bpm  Assessment/Plan: 1. CAD/NSTEMI with critcal LM disease - LHC 04/2020 with severe multivessel disease.  - s/p CABG 05/18/20 with Maze and LAA clipping. LIMA -> LAD, RIMA  -> PDA. Left radial OM1-> OM2 - NST (09/2021) at [facility] negative for ischemia  - No chest pain.  - Patient reported stopping aspirin  due to bleeding with urination. Importance of taking aspirin  81 mg daily discussed with the patient - Continue rosuvastatin  10 mg daily  - Refill nitroglycerin  0.4 mg sublingual tablets PRN for chest pain. Education provided on appropriate use of nitroglycerin .   2. Chronic Systolic Heart Failure due to ischemic CM - Echo (2020): EF 60-65%.  - Echo (04/2020): EF 25-30%. RV  normal. Mild AS   - Echo (05/2020): EF improved 40-45% normal RV - Nuclear Stress test at [facility] 09/2021 w/ EF 54%  - Echo (02/2023 at [facility]): EF 50%, G2DD, RV normal, moderate AS - Echo (02/2024 at [facility]): EF 50-55%, diastolic function not well-visualized, mild LVH, moderate to severe AS. - PYP scan and SPEP/UPEP IFE + K/L free light chain labs negative for ATTR and AL amyloid.  - Persistent NYHA IIb-III symptoms, weight down from last visit visit. Suspect AF/PVCs and valvular disease contributing to volume.FYI volume improved  at structural heart f/u w/ PRN furosemide .  - Multiple medication intolerances, GDMT has been limited by orthostasis as well.  - Continue furosemide  20 mg PRN. Use compression hose as needed.  - Continue metoprolol  succinate 50 mg daily. Patient just self-increased to 50 mg daily. Given poor previous tolerance, will make no further changes today - Can consider retrialing losartan  at future appointment. No prior side effects reported by the patient today.  - Can consider retrialing eplerenone  at future appointment. Previously discontinued due to frequent urination, patient willing to re-trial medication in the future.  - Failed Farxiga and Jardiance  with history of UTIs - Mediation list updated with the patient.    3. PAF  - s/p MAZE w/ LAA Clip  -  Holter monitor 07/2023: SR with 1% Afib burden longest episode 10 minutes, 6% PVCs, 10% PACs. - Previous off Eliquis  2/2 hematuria, he is unsure if he wants to restart. Strongly advised by APP last visit that he restart Eliquis  5 mg BID, instructed to stop ASA if he restarts AC. -No longer on diltiazem , discontinued by Dr. Verlin. - Continue metoprolol  succinate 50 mg daily - ECG from [facility] reviewed on 04/01/24 with APP, appearred AF with RVR, HR 115, with polymorphic PVCs   4. PVCs - Frequent on ECG 04/01/24 - Asymptomatic -No longer on diltiazem , discontinued by Dr. Verlin. - Continue metoprolol  succinate 50 mg daily - Continue  CPAP   5. Aortic Stenosis - Echo (02/2023) with moderate AS, AVA 0.9 cm2, dimensionless index 0.28, mean gradient 28 mmHg - Echo 02/2024 at [facility]: moderate to severe AS, peak aortic valve velocity 364 cm/s, AVA 1.0-1.1 cm2, moderate MR and TR, RVSP 25 mmHg - With worsening symptoms, was referred to Structural Heart Team for TAVR eval. Saw Dr. Verlin 04/20/24. May be good TAVR candidate per notes. Plan to reassess after follow up echo in 3-4 months.    6. H/o Prostate Cancer - Continue alfuzosin . - S/p TURP X 4.  Follow up with Dr. Bensimhon in 1 week   Please do not hesitate to reach out with questions or concerns,  Morna Breach, PharmD PGY2 Cardiology Pharmacy Resident  Jaun Bash, PharmD, CPP, BCPS, [facility] Heart Failure Pharmacist  Phone - [phone] 05/23/2024 4:43 PM

## 2024-05-25 ENCOUNTER — Ambulatory Visit: Admitting: Psychology

## 2024-05-25 DIAGNOSIS — F4323 Adjustment disorder with mixed anxiety and depressed mood: Secondary | ICD-10-CM | POA: Diagnosis not present

## 2024-05-25 NOTE — Progress Notes (Signed)
 [facility] Counselor/Therapist Progress Note  Patient ID: NASHID PELLUM, MRN: [id number],    Date: 05/25/2024  Time Spent: 10:00am-10:55am   55 minutes   Treatment Type: Individual Therapy  Reported Symptoms: stress, worrying  Mental Status Exam: Appearance:  Casual     Behavior: Appropriate  Motor: Normal  Speech/Language:  Normal Rate  Affect: Appropriate  Mood: normal  Thought process: normal  Thought content:   WNL  Sensory/Perceptual disturbances:   WNL  Orientation: oriented to person, place, time/date, and situation  Attention: Good  Concentration: Good  Memory: WNL  Fund of knowledge:  Good  Insight:   Good  Judgment:  Good  Impulse Control: Good   Risk Assessment: Danger to Self:  No Self-injurious Behavior: No Danger to Others: No Duty to Warn:no Physical Aggression / Violence:No  Access to Firearms a concern: No  Gang Involvement:No   Subjective: Pt present for face-to-face individual therapy in person.  Pt talked about his his health.  He is having cardiac issues.  He is going to the congestive heart failure clinic at [facility].  Pt has not been feeling well and is having side effects from the medication which is upsetting to him.  Pt is sensitive to medications.  Pt is sleeping a lot of hours but still feels exhausted.  Addressed pt's health concerns.  Pt states he is feeling some confusion at times and has a harder time making small decisions.  Pt states it is scary to see his health decline.  Helped pt address his fears.   Pt states everything seems to be a struggle.   Worked on coping strategies. 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  He states things are going slowly.  Pt's oldest daughter is his POA and takes him to doctors appointments since she is a Publishing rights manager.   Pt talked about his wife.   Pt's wife is experiencing cognitive decline.  Pt feels like he has to babysit his wife.  His wife misplaces things a  lot.  This is frustrating for pt.  Pt gets easily frustrated and has a negative mindset.   Pt states he feels tired and overwhelmed with his health issues and care of his wife who has cognitive issues.   Addressed the caregiving challenges pt deals with.  Worked on problem solving and stress management.   He tends to have a lot of worry thoughts that he ruminates about.  Addressed the thoughts that pt ruminates on.  Worked on thought reframing.  Addressed how pt can focus on doing something in the present moment to distract from worry thoughts.   Worked on self care strategies. Provided supportive therapy.   Interventions: Cognitive Behavioral Therapy and Insight-Oriented  Diagnosis:  F43.23  Plan of Care: Recommend ongoing therapy.  Pt participated in setting treatment goals.  Pt wants to improve coping skills and feel better so he can help his wife.  Pt would like to enjoy life more.   Plan to meet every two weeks.   Pt is in agreement with treatment plan.    Treatment Plan Client Abilities/Strengths  Pt is bright, engaging, and motivated for therapy.  Client Treatment Preferences  Individual therapy.  Client Statement of Needs  Improve copings skills. Symptoms  Depressed or irritable mood. Excessive and/or unrealistic worry that is difficult to control occurring more days than not for at least 6 months about a number of events or activities. Hypervigilance (e.g., feeling constantly on edge, experiencing concentration  difficulties, having trouble falling or staying asleep, exhibiting a general state of irritability).  Problems Addressed  Unipolar Depression, Anxiety Goals 1. Alleviate depressive symptoms and return to previous level of effective functioning. 2. Appropriately grieve the loss in order to normalize mood and to return to previously adaptive level of functioning. Objective Learn and implement behavioral strategies to overcome depression. Target Date: 2024-09-29 Frequency:  Biweekly  Progress: 10 Modality: individual  Related Interventions Assist the client in developing skills that increase the likelihood of deriving pleasure from behavioral activation (e.g., assertiveness skills, developing an exercise plan, less internal/more external focus, increased social involvement); reinforce success. Engage the client in behavioral activation, increasing his/her activity level and contact with sources of reward, while identifying processes that inhibit activation. use behavioral techniques such as instruction, rehearsal, role-playing, role reversal, as needed, to facilitate activity in the client's daily life; reinforce success. 3. Develop healthy interpersonal relationships that lead to the alleviation and help prevent the relapse of depression. 4. Develop healthy thinking patterns and beliefs about self, others, and the world that lead to the alleviation and help prevent the relapse of depression. 5. Enhance ability to effectively cope with the full variety of life's worries and anxieties. 6. Learn and implement coping skills that result in a reduction of anxiety and worry, and improved daily functioning. Objective Learn and implement problem-solving strategies for realistically addressing worries. Target Date: 2024-09-29 Frequency: Biweekly  Progress: 10 Modality: individual  Related Interventions Assign the client a homework exercise in which he/she problem-solves a current problem (see Mastery of Your Anxiety and Worry: Workbook by Richarda and Jonne or Generalized Anxiety Disorder by Delores Filler, and Jonne); review, reinforce success, and provide corrective feedback toward improvement. Teach the client problem-solving strategies involving specifically defining a problem, generating options for addressing it, evaluating the pros and cons of each option, selecting and implementing an optional action, and reevaluating and refining the action. Objective Learn and  implement calming skills to reduce overall anxiety and manage anxiety symptoms. Target Date: 2024-09-29 Frequency: Biweekly  Progress: 10 Modality: individual  Related Interventions Assign the client to read about progressive muscle relaxation and other calming strategies in relevant books or treatment manuals (e.g., Progressive Relaxation Training by Thornell armin Collier; Mastery of Your Anxiety and Worry: Workbook by Richarda armin Jonne). Assign the client homework each session in which he/she practices relaxation exercises daily, gradually applying them progressively from non-anxiety-provoking to anxiety-provoking situations; review and reinforce success while providing corrective feedback toward improvement. Teach the client calming/relaxation skills (e.g., applied relaxation, progressive muscle relaxation, cue controlled relaxation; mindful breathing; biofeedback) and how to discriminate better between relaxation and tension; teach the client how to apply these skills to his/her daily life. 7. Recognize, accept, and cope with feelings of depression. 8. Reduce overall frequency, intensity, and duration of the anxiety so that daily functioning is not impaired. 9. Resolve the core conflict that is the source of anxiety. 10. Stabilize anxiety level while increasing ability to function on a daily basis. Diagnosis F43.23   Conditions For Discharge Achievement of treatment goals and objectives   Veva Alma, LCSW

## 2024-05-27 ENCOUNTER — Telehealth (HOSPITAL_COMMUNITY): Payer: Self-pay | Admitting: Internal Medicine

## 2024-05-27 NOTE — Telephone Encounter (Signed)
 Called to confirm/remind patient of their appointment at the [facility] on 05/27/2024.   Appointment:   [x] Confirmed  [] Left mess   [] No answer/No voice mail  [] VM Full/unable to leave message  [] Phone not in service  Patient reminded to bring all medications and/or complete list.  Confirmed patient has transportation. Gave directions, instructed to utilize valet parking.

## 2024-05-30 ENCOUNTER — Ambulatory Visit (HOSPITAL_COMMUNITY)
Admission: RE | Admit: 2024-05-30 | Discharge: 2024-05-30 | Disposition: A | Discharge status: Home / Self Care | Source: Ambulatory Visit | Attending: Internal Medicine | Admitting: Internal Medicine

## 2024-05-30 ENCOUNTER — Encounter (HOSPITAL_COMMUNITY): Payer: Self-pay | Admitting: Internal Medicine

## 2024-05-30 VITALS — BP 130/58 | HR 71 | Ht 68.0 in | Wt 247.9 lb

## 2024-05-30 DIAGNOSIS — I480 Paroxysmal atrial fibrillation: Secondary | ICD-10-CM | POA: Diagnosis not present

## 2024-05-30 DIAGNOSIS — I350 Nonrheumatic aortic (valve) stenosis: Secondary | ICD-10-CM

## 2024-05-30 DIAGNOSIS — F410 Panic disorder [episodic paroxysmal anxiety] without agoraphobia: Secondary | ICD-10-CM | POA: Insufficient documentation

## 2024-05-30 DIAGNOSIS — Z87891 Personal history of nicotine dependence: Secondary | ICD-10-CM | POA: Diagnosis not present

## 2024-05-30 DIAGNOSIS — Z9079 Acquired absence of other genital organ(s): Secondary | ICD-10-CM | POA: Diagnosis not present

## 2024-05-30 DIAGNOSIS — I255 Ischemic cardiomyopathy: Secondary | ICD-10-CM | POA: Insufficient documentation

## 2024-05-30 DIAGNOSIS — I110 Hypertensive heart disease with heart failure: Secondary | ICD-10-CM | POA: Insufficient documentation

## 2024-05-30 DIAGNOSIS — Z7984 Long term (current) use of oral hypoglycemic drugs: Secondary | ICD-10-CM | POA: Diagnosis not present

## 2024-05-30 DIAGNOSIS — I493 Ventricular premature depolarization: Secondary | ICD-10-CM

## 2024-05-30 DIAGNOSIS — Z6837 Body mass index (BMI) 37.0-37.9, adult: Secondary | ICD-10-CM | POA: Diagnosis not present

## 2024-05-30 DIAGNOSIS — G4733 Obstructive sleep apnea (adult) (pediatric): Secondary | ICD-10-CM | POA: Insufficient documentation

## 2024-05-30 DIAGNOSIS — I472 Ventricular tachycardia, unspecified: Secondary | ICD-10-CM | POA: Diagnosis not present

## 2024-05-30 DIAGNOSIS — I5022 Chronic systolic (congestive) heart failure: Secondary | ICD-10-CM | POA: Diagnosis not present

## 2024-05-30 DIAGNOSIS — Z79899 Other long term (current) drug therapy: Secondary | ICD-10-CM | POA: Insufficient documentation

## 2024-05-30 DIAGNOSIS — I252 Old myocardial infarction: Secondary | ICD-10-CM | POA: Diagnosis not present

## 2024-05-30 DIAGNOSIS — I2510 Atherosclerotic heart disease of native coronary artery without angina pectoris: Secondary | ICD-10-CM | POA: Diagnosis not present

## 2024-05-30 DIAGNOSIS — M1710 Unilateral primary osteoarthritis, unspecified knee: Secondary | ICD-10-CM | POA: Insufficient documentation

## 2024-05-30 DIAGNOSIS — I471 Supraventricular tachycardia, unspecified: Secondary | ICD-10-CM | POA: Diagnosis not present

## 2024-05-30 DIAGNOSIS — Z7982 Long term (current) use of aspirin: Secondary | ICD-10-CM | POA: Insufficient documentation

## 2024-05-30 DIAGNOSIS — E669 Obesity, unspecified: Secondary | ICD-10-CM | POA: Insufficient documentation

## 2024-05-30 DIAGNOSIS — J449 Chronic obstructive pulmonary disease, unspecified: Secondary | ICD-10-CM | POA: Diagnosis not present

## 2024-05-30 DIAGNOSIS — Z951 Presence of aortocoronary bypass graft: Secondary | ICD-10-CM

## 2024-05-30 NOTE — Addendum Note (Signed)
 Encounter addended by: Buell Powell HERO, RN on: 05/30/2024 9:51 AM  Actions taken: Letter saved

## 2024-05-30 NOTE — Progress Notes (Signed)
 [facility] Note    PCP: Kenney Clotilda PARAS, [place] [facility] Cardiologist: Dr. Micky HF Cardiologist: Dr. Cherrie    HPI  Eric Stokes is a 78 y.o. with obesity, HTN, OSA on CPAP, HL, prostate CA, ? Asthma vs COPD, PAF, CAD s/p CABG 05/18/20 and systolic HF.   Echo in 2020 that showed EF 60-65% with normal RV.    Admitted 10/21 with chest pain and shortness of breath. In the ED was in A fib RVR and had ST depression. HS Trop 8318>1955. Cath showed severe multivessel disease with elevated filling pressures. Echo EF 25-30% with mild AS. IABP placed. Underwent CABG 05/18/20 with Maze and LAA clipping. LIMA -> LAD, RIMA  -> PDA. Left radial OM1-> OM2.  Repeat echo 10/10 and showed EF improved 40-45% normal RV. Discharged home on 05/30/20.   Several med intolerances. Bisoprolol  stopped due to low BP. Developed gynecomastia w/ spiro and started on eplerenone . Farxiga stopped due recurrent UTIs. Off Eliquis  due to hematuria.  Nuclear stress test at [facility] (2/23) showed no ischemia, EF 54%.  Seen in ED 04/24/22 with dizziness and SOB. Found to have UTI. D dimer +, CTA negative. Treated with IV ceftriaxone .   He stopped his Inspra  on his own due to frequent urination with resolution of symptoms.     S/p insertion of suprapubic catheter (2/24), no anesthesia complications. S/p cystoscopy with bladder neck dilation, TURP 3/24.  Echo 7/24 EF 50%, moderate LVH, G2DD, RV ok, moderate TR, AoV mean gradient increased 11--> 28 mmHg  14 day holter at [facility] 12/24: SR with 1% Afib burden (longest episode 10 min), 10.5% PACs, 6% PVCs  Echo 03/01/24 (at [facility]): EF 50-55%, diastolic function not well-visualized, mild LVH, moderate to severe AS, peak aortic valve velocity 364 cm/s, AVA 1.0-1.1 cm2, moderate Eric and TR, RVSP 25 mmHg  PYP 9/25 negative for amyloid  At last visit was volume overload ReDS 46% and having frequent PVCs. Jardiance  added. Zio ordered. Also saw Dr. Verlin in Structural Program.  Diltiazem  switched to Toprol    Zio 9/25 1. Sinus rhythm - avg HR of 75 bpm.  2. 61 nonsustained Ventricular Tachycardia runs occurred, the run with the fastest interval lasting 4 beats with a max rate of 190 bpm, the longest  lasting 12 beats with an avg rate of 148 bpm. ] 3. 3495 runs of Supraventricular Tachycardia occurred, the run with the fastest interval lasting 6 beats with a max rate of 167 bpm, the longest lasting 23.4 secs with an avg rate of 97 bpm. Supraventricular Tachycardia was detected within +/- 45 seconds of symptomatic patient event(s).  4. Isolated PACs were frequent (6.3%, [id number]), SVE Couplets were occasional (2.8%, 21568), and SVE Triplets were occasional (1.3%, 6753).  5. Isolated PVCs were frequent (5.5%, 84908), VE Couplets were rare (<1.0%, 4490), and VE Triplets were rare (<1.0%, 233).   Today he returns for HF follow up. Feels much better. ReDS 46%-> 36%. Says he is very active keeping his garden. Can dig/shovel without problem. Walking limited due to knee arthritis. No edema, orthopnea or PND> Minimal chest tightness with his panic attacks.     Past Medical History:  Diagnosis Date   Aortic stenosis, mild    last echo in epic 05-27-2020  ef 40-45%,  mean grandiant , valve area 2.46 cm^2 ,  mild regurg   BPH with obstruction/lower urinary tract symptoms    urologist--- dr eskridge   Carotid bruit present 06/15/2018   2015 Carotid Dopplers:  Essentially normal carotid arteries, with very slight hard plaque in the proximal ICA's and serpentine distal vessels. 1-39% bilateral ICA stenosis. Patent vertebral arteries with antegrade flow. Normal subclavian arteries, bilaterally.   Chronic systolic CHF (congestive heart failure) (HCC) 05/2020   followed by dr jaclynn   COPD (chronic obstructive pulmonary disease) (HCC)    Coronary artery disease 04/2020   cardiologist--- dr cherrie;   NSTEMI /  Afib w/ RVR/  Acute CHF/  cardiogenic shock 04-20-2020;  cath  05-17-2020 multivessel disease w/ critical LM;   05-18-2020 s/p CABG x4 and Maze procedure;  nuclear stress study done [facility] 09-17-2021 no ischemia, ef 54%   Essential hypertension, benign 12/21/2008   Qualifier: Diagnosis of  By: Lavona, MD, CODY Agent     Glaucoma, both eyes    History of migraine    History of non-ST elevation myocardial infarction (NSTEMI) 05/17/2020   cardiogenic shock,  AF w/ RVR, acute CHF,  ICM;   s/p CABG w/ MAZE   History of squamous cell carcinoma of skin    History of TIA (transient ischemic attack) 02/28/2019   questionable TIA  per neurology note dr rosemarie, 04-12-2019, likely migraine   Hx of colonic polyps 01/09/2020   Hyperlipidemia    Ischemic cardiomyopathy 04/2020   per cath 09-03-20212  ef 25-30%;   last echo in epic 05-27-2020  ef 40-45%   Malignant neoplasm prostate (HCC) 10/2019   urologist--- dr nieves;  dx 03/ 2021  active survillance   Myocardial infarction (HCC)    OA (osteoarthritis)    OSA on CPAP    Diagnosed at the [facility] 2016    PAF (paroxysmal atrial fibrillation) (HCC) 04/2020   followed by cardiology---  s/p MAZE procedure 05-18-2020, takes ASA   PVC's (premature ventricular contractions)    S/P CABG x 4 05/18/2020   LIMA to LAD,  RIMA to PDA, SVG to OM1 and OM2   Seasonal allergic rhinitis    Sinus bradycardia    event monitor 01-29-2017  mild bradycardia, no pauses, rare PVCs   Vitamin B 12 deficiency    Vitamin D  deficiency 11/26/2010   Wears hearing aid in both ears    Current Outpatient Medications  Medication Sig Dispense Refill   acetaminophen  (TYLENOL ) 500 MG tablet Take 500-1,000 mg by mouth every 6 (six) hours as needed (pain.).     albuterol  (VENTOLIN  HFA) 108 (90 Base) MCG/ACT inhaler Inhale 1 puff into the lungs as needed for wheezing or shortness of breath.     alfuzosin  (UROXATRAL ) 10 MG 24 hr tablet Take 10 mg by mouth at bedtime.     apixaban  (ELIQUIS ) 5 MG TABS tablet Take 1 tablet (5 mg total) by  mouth 2 (two) times daily.     cetirizine (ZYRTEC) 10 MG tablet Take 10 mg by mouth at bedtime.     Cholecalciferol  (VITAMIN D ) 50 MCG (2000 UT) tablet Take 2,000 Units by mouth daily.     fluticasone  (FLONASE ) 50 MCG/ACT nasal spray USE TWO SPRAYS IN EACH NOSTRIL DAILY AS NEEDED. 48 mL 0   furosemide  (LASIX ) 20 MG tablet Take 1 tablet (20 mg total) by mouth daily as needed. 30 tablet 3   ketotifen (ZADITOR) 0.025 % ophthalmic solution Place 1 drop into both eyes daily as needed (allergies).     latanoprost  (XALATAN ) 0.005 % ophthalmic solution INSTILL 1 DROP INTO BOTH EYES AT BEDTIME 11.25 mL 1   metoprolol  succinate (TOPROL -XL) 50 MG 24 hr tablet Take 1 tablet (  50 mg total) by mouth daily. Take with or immediately following a meal. 90 tablet 3   montelukast  (SINGULAIR ) 10 MG tablet TAKE 1 TABLET BY MOUTH EVERY DAY AT BEDTIME 90 tablet 3   nitroGLYCERIN  (NITROSTAT ) 0.4 MG SL tablet Place 1 tablet (0.4 mg total) under the tongue every 5 (five) minutes as needed for chest pain. 90 tablet 3   NON FORMULARY Cpap at night     rosuvastatin  (CRESTOR ) 20 MG tablet Take 1 tablet (20 mg total) by mouth daily. 90 tablet 3   oxyCODONE  (OXY IR/ROXICODONE ) 5 MG immediate release tablet Take 1 tablet (5 mg total) by mouth every 6 (six) hours as needed. (Patient not taking: Reported on 05/30/2024) 6 tablet 0   No current facility-administered medications for this encounter.   Allergies  Allergen Reactions   Flomax  [Tamsulosin ] Other (See Comments)    dizziness   Cipro [Ciprofloxacin Hcl] Swelling    Knee swelling, joint pain   Lipitor [Atorvastatin] Other (See Comments)    Leg Cramps   Lisinopril Cough   Farxiga [Dapagliflozin] Other (See Comments)    Had UTI on Farxiga and Jardiance    Spironolactone  Other (See Comments)    Grew breasts    Social History   Socioeconomic History   Marital status: Married    Spouse name: Not on file   Number of children: 2   Years of education: 13   Highest  education level: Not on file  Occupational History   Occupation: retired   Occupation: Retired Holiday representative  Tobacco Use   Smoking status: Former    Current packs/day: 0.00    Types: Cigarettes    Start date: 1964    Quit date: 1967    Years since quitting: 58.8    Passive exposure: Past   Smokeless tobacco: Never  Vaping Use   Vaping status: Never Used  Substance and Sexual Activity   Alcohol  use: Yes    Comment: Occasional beer   Drug use: Never   Sexual activity: Yes  Other Topics Concern   Not on file  Social History Narrative   Is a [place] veteran, has trouble sleeping most nights. Often experiences nightmares w/anesthesia meds.   Social Drivers of Corporate investment banker Strain: Low Risk  (03/05/2020)   Overall Financial Resource Strain (CARDIA)    Difficulty of Paying Living Expenses: Not hard at all  Food Insecurity: No Food Insecurity (09/13/2021)   Received from [facility]   Hunger Vital Sign    Within the past 12 months, you worried that your food would run out before you got the money to buy more.: Never true    Within the past 12 months, the food you bought just didn't last and you didn't have money to get more.: Never true  Transportation Needs: No Transportation Needs (03/05/2020)   PRAPARE - Administrator, Civil Service (Medical): No    Lack of Transportation (Non-Medical): No  Physical Activity: Unknown (03/05/2020)   Exercise Vital Sign    Days of Exercise per Week: 7 days    Minutes of Exercise per Session: Not on file  Stress: No Stress Concern Present (03/05/2020)   [employer] of Occupational Health - Occupational Stress Questionnaire    Feeling of Stress : Not at all  Social Connections: Unknown (12/27/2021)   Received from [facility]   Social Network    Social Network: Not on file  Intimate Partner Violence: Unknown (11/18/2021)   Received from [facility]  HITS    Physically Hurt: Not on file    Insult or Talk Down  To: Not on file    Threaten Physical Harm: Not on file    Scream or Curse: Not on file   Family History  Problem Relation Age of Onset   Diabetes Mother    COPD Father    Stroke Paternal Grandfather    BP (!) 130/58   Pulse 71   Ht 5' 8 (1.727 m)   Wt 112.4 kg (247 lb 14.4 oz) Comment: with boots  SpO2 97%   BMI 37.69 kg/m   Wt Readings from Last 3 Encounters:  05/30/24 112.4 kg (247 lb 14.4 oz)  05/23/24 109.3 kg (241 lb)  04/28/24 110.2 kg (243 lb)   PHYSICAL EXAM: General:  Well appearing. No resp difficulty HEENT: normal Neck: supple. no JVD. Carotids 2+ bilat; + bruits. No lymphadenopathy or thryomegaly appreciated. Cor: PMI nondisplaced. Regular rate & rhythm. 3/6 high-pitched AS S2 severely reduced Lungs: clear Abdomen: soft, nontender, nondistended. No hepatosplenomegaly. No bruits or masses. Good bowel sounds. Extremities: no cyanosis, clubbing, rash, edema Neuro: alert & orientedx3, cranial nerves grossly intact. moves all 4 extremities w/o difficulty. Affect pleasant   ECG (personally reviewed):SR 73 1 PVC No ST-T wave abnormalities. Personally reviewed  ReDs reading: 36%, abnormal  ASSESSMENT & PLAN:  1. Chronic Systolic Heart Failure due to ischemic CM - with recovered EF - Echo (2020): EF 60-65%.  - Echo (9/21): EF 25-30%. RV normal. Mild AS   - Echo (10/21): EF improved 40-45% normal RV - Nuclear Stress test at [facility] 2/23 w/ EF 54%  - Echo (7/24 [facility] EF 50%, G2DD, RV normal, moderate AS - Echo (7/25 at [facility]): EF 50-55%, diastolic function not well-visualized, mild LVH, moderate to severe AS. - PYP 9/25 negative for amyloid - NYHA I-II. Volume much improved ReDS 36% - Multiple medication intolerances, GDMT has been limited by orthostasis as well -  Jardiance  10 mg daily. (Had UTIs in past but none recently, last TURP 02/20/24) - Continue Toprol  50  - Will give Lasix  20 mg PRN. - He stopped losartan  & Inspra  and not interested in restarting  currently  2. CAD/NSTEMI with critcal LM disease - LHC 9/21 with severe multivessel disease.  - s/p CABG 05/18/20 with Maze and LAA clipping. LIMA -> LAD, RIMA  -> PDA. Left radial OM1-> OM2 - NST (2/23) at [facility] negative for ischemia  - No s/s angina - Continue ASA 81 mg daily. - Continue rosuvastatin  10 mg daily   3. PAF  - s/p MAZE w/ LAA Clip  - Holter monitor 12/24: SR with 1% Afib burden longest episode 10 minutes, 6% PVCs, 10% PACs. - Previous off Eliquis  2/2 hematuria but has now restarted. Still with mild hematuria but stable  4. PVCs/NSVT/SVT - Zio 9/25 5.3% PVCs. 61 runs NSVT, 3,495 runs SVT longest 23 secs - Seems to be improved with switch from Diltiazem  to Toprol . Can repeat monitor at next visit. Can start amio if needed - Continue CPAP  5. Aortic Stenosis - Echo (7/24) with moderate AS, AVA 0.9 cm2, dimensionless index 0.28, mean gradient 28 mmHg - Echo 7/25 at [facility]: moderate to severe AS, peak aortic valve velocity 364 cm/s, AVA 1.0-1.1 cm2, moderate Eric and TR, RVSP - Valve sounds very tight on exam  - Now followed by Structural Team (Dr. Verlin)  6. H/o Prostate Cancer - Continue alfuzosin . - S/p TURP X 4.   Toribio  Serine Kea, MD 05/30/24

## 2024-05-30 NOTE — Progress Notes (Signed)
 ReDS Vest / Clip - 05/30/24 0911       ReDS Vest / Clip   Station Marker D    Ruler Value 41    ReDS Value Range Moderate volume overload    ReDS Actual Value 36

## 2024-05-30 NOTE — Patient Instructions (Signed)
 Great to see you today!!!  Medication Changes:  None, continue current medications  Special Instructions // Education:  Do the following things EVERYDAY: Weigh yourself in the morning before breakfast. Write it down and keep it in a log. Take your medicines as prescribed Eat low salt foods--Limit salt (sodium) to 2000 mg per day.  Stay as active as you can everyday Limit all fluids for the day to less than 2 liters   Follow-Up in: 6 months (April 2026), **PLEASE CALL OUR OFFICE IN FEBRUARY TO SCHEDULE THIS APPOINTMENT   At the [facility], you and your health needs are our priority. We have a designated team specialized in the treatment of Heart Failure. This Care Team includes your primary Heart Failure Specialized Cardiologist (physician), Advanced Practice Providers (APPs- Physician Assistants and Nurse Practitioners), and Pharmacist who all work together to provide you with the care you need, when you need it.   You may see any of the following providers on your designated Care Team at your next follow up:  Dr. Toribio Fuel Dr. Ezra Shuck Dr. Ria Commander Dr. Odis Brownie Greig Mosses, NP Caffie Shed, [place] [facility], [place] Beckey Coe, NP [place] Lee, NP Tinnie Redman, PharmD   Please be sure to bring in all your medications bottles to every appointment.   Need to Contact Us :  If you have any questions or concerns before your next appointment please send us  a message through [place] or call our office at [phone].    TO LEAVE A MESSAGE FOR THE NURSE SELECT OPTION 2, PLEASE LEAVE A MESSAGE INCLUDING: YOUR NAME DATE OF BIRTH CALL BACK NUMBER REASON FOR CALL**this is important as we prioritize the call backs  YOU WILL RECEIVE A CALL BACK THE SAME DAY AS LONG AS YOU CALL BEFORE 4:00 PM

## 2024-06-06 NOTE — Addendum Note (Signed)
 Encounter addended by: Dante Jeannine HERO, CMA on: 06/06/2024 2:12 PM  Actions taken: Charge Capture section accepted

## 2024-06-15 ENCOUNTER — Ambulatory Visit (INDEPENDENT_AMBULATORY_CARE_PROVIDER_SITE_OTHER): Admitting: Psychology

## 2024-06-15 DIAGNOSIS — F4323 Adjustment disorder with mixed anxiety and depressed mood: Secondary | ICD-10-CM | POA: Diagnosis not present

## 2024-06-15 NOTE — Progress Notes (Signed)
 [facility] Counselor/Therapist Progress Note  Patient ID: SONG GARRIS, MRN: [id number],    Date: 06/15/2024  Time Spent: 10:00am-10:55am   55 minutes   Treatment Type: Individual Therapy  Reported Symptoms: stress, worrying  Mental Status Exam: Appearance:  Casual     Behavior: Appropriate  Motor: Normal  Speech/Language:  Normal Rate  Affect: Appropriate  Mood: normal  Thought process: normal  Thought content:   WNL  Sensory/Perceptual disturbances:   WNL  Orientation: oriented to person, place, time/date, and situation  Attention: Good  Concentration: Good  Memory: WNL  Fund of knowledge:  Good  Insight:   Good  Judgment:  Good  Impulse Control: Good   Risk Assessment: Danger to Self:  No Self-injurious Behavior: No Danger to Others: No Duty to Warn:no Physical Aggression / Violence:No  Access to Firearms a concern: No  Gang Involvement:No   Subjective: Pt present for face-to-face individual therapy in person.  Pt talked about having trouble getting out of bed in the morning bc of not feeling well physically and emotionally.   Addressed how pt can be gentle with himself and give himself the rest he needs.  Pt described his medical issues and that he has a lot of negative side effects from medications.  Pt feels anxious about his health at times.  Pt worries about his wife bc of her cognitive decline.   She repeats herself a lot and pt gets frustrated with it.   Pt feels like he has to babysit his wife.  Pt talked about being a worrier since he was a little kid.   His father raised him and none of the adults around him were worriers.  He was always trying to come up with a plan.   Pt recently turned 78 and thinks about getting close to 79.  He doesn't feel his age mentally but does physically.   Addressed how pt can do more of the things that refuel him.   Worked on problem solving and stress management.   Pt states that he benefits from coming  to therapy and venting.   He needs a safe place to voice how hard things are for him and he feels therapy provides that safe place for him.  Provided supportive therapy.   Interventions: Cognitive Behavioral Therapy and Insight-Oriented  Diagnosis:  F43.23  Plan of Care: Recommend ongoing therapy.  Pt participated in setting treatment goals.  Pt wants to improve coping skills and feel better so he can help his wife.  Pt would like to enjoy life more.   Plan to meet every two weeks.   Pt is in agreement with treatment plan.    Treatment Plan Client Abilities/Strengths  Pt is bright, engaging, and motivated for therapy.  Client Treatment Preferences  Individual therapy.  Client Statement of Needs  Improve copings skills. Symptoms  Depressed or irritable mood. Excessive and/or unrealistic worry that is difficult to control occurring more days than not for at least 6 months about a number of events or activities. Hypervigilance (e.g., feeling constantly on edge, experiencing concentration difficulties, having trouble falling or staying asleep, exhibiting a general state of irritability).  Problems Addressed  Unipolar Depression, Anxiety Goals 1. Alleviate depressive symptoms and return to previous level of effective functioning. 2. Appropriately grieve the loss in order to normalize mood and to return to previously adaptive level of functioning. Objective Learn and implement behavioral strategies to overcome depression. Target Date: 2024-09-29 Frequency: Biweekly  Progress: 10 Modality: individual  Related Interventions Assist the client in developing skills that increase the likelihood of deriving pleasure from behavioral activation (e.g., assertiveness skills, developing an exercise plan, less internal/more external focus, increased social involvement); reinforce success. Engage the client in behavioral activation, increasing his/her activity level and contact with sources of reward,  while identifying processes that inhibit activation. use behavioral techniques such as instruction, rehearsal, role-playing, role reversal, as needed, to facilitate activity in the client's daily life; reinforce success. 3. Develop healthy interpersonal relationships that lead to the alleviation and help prevent the relapse of depression. 4. Develop healthy thinking patterns and beliefs about self, others, and the world that lead to the alleviation and help prevent the relapse of depression. 5. Enhance ability to effectively cope with the full variety of life's worries and anxieties. 6. Learn and implement coping skills that result in a reduction of anxiety and worry, and improved daily functioning. Objective Learn and implement problem-solving strategies for realistically addressing worries. Target Date: 2024-09-29 Frequency: Biweekly  Progress: 10 Modality: individual  Related Interventions Assign the client a homework exercise in which he/she problem-solves a current problem (see Mastery of Your Anxiety and Worry: Workbook by Richarda and Jonne or Generalized Anxiety Disorder by Delores Filler, and Jonne); review, reinforce success, and provide corrective feedback toward improvement. Teach the client problem-solving strategies involving specifically defining a problem, generating options for addressing it, evaluating the pros and cons of each option, selecting and implementing an optional action, and reevaluating and refining the action. Objective Learn and implement calming skills to reduce overall anxiety and manage anxiety symptoms. Target Date: 2024-09-29 Frequency: Biweekly  Progress: 10 Modality: individual  Related Interventions Assign the client to read about progressive muscle relaxation and other calming strategies in relevant books or treatment manuals (e.g., Progressive Relaxation Training by Thornell armin Collier; Mastery of Your Anxiety and Worry: Workbook by Richarda armin Jonne). Assign the client homework each session in which he/she practices relaxation exercises daily, gradually applying them progressively from non-anxiety-provoking to anxiety-provoking situations; review and reinforce success while providing corrective feedback toward improvement. Teach the client calming/relaxation skills (e.g., applied relaxation, progressive muscle relaxation, cue controlled relaxation; mindful breathing; biofeedback) and how to discriminate better between relaxation and tension; teach the client how to apply these skills to his/her daily life. 7. Recognize, accept, and cope with feelings of depression. 8. Reduce overall frequency, intensity, and duration of the anxiety so that daily functioning is not impaired. 9. Resolve the core conflict that is the source of anxiety. 10. Stabilize anxiety level while increasing ability to function on a daily basis. Diagnosis F43.23   Conditions For Discharge Achievement of treatment goals and objectives   Veva Alma, LCSW

## 2024-07-01 ENCOUNTER — Telehealth: Admitting: Physician Assistant

## 2024-07-01 ENCOUNTER — Other Ambulatory Visit (HOSPITAL_BASED_OUTPATIENT_CLINIC_OR_DEPARTMENT_OTHER): Payer: Self-pay

## 2024-07-01 DIAGNOSIS — J019 Acute sinusitis, unspecified: Secondary | ICD-10-CM | POA: Diagnosis not present

## 2024-07-01 DIAGNOSIS — B9689 Other specified bacterial agents as the cause of diseases classified elsewhere: Secondary | ICD-10-CM

## 2024-07-01 MED ORDER — AMOXICILLIN-POT CLAVULANATE 875-125 MG PO TABS
1.0000 | ORAL_TABLET | Freq: Two times a day (BID) | ORAL | 0 refills | Status: AC
  Filled 2024-07-01: qty 14, 7d supply, fill #0

## 2024-07-01 NOTE — Progress Notes (Signed)
 E-Visit for Sinus Problems  We are sorry that you are not feeling well.  Here is how we plan to help!  Based on what you have shared with me it looks like you have sinusitis.  Sinusitis is inflammation and infection in the sinus cavities of the head.  Based on your presentation I believe you most likely have Acute Bacterial Sinusitis.  This is an infection caused by bacteria and is treated with antibiotics. I have prescribed Augmentin  875mg /125mg  one tablet twice daily with food, for 7 days.   You may use an oral decongestant such as Mucinex D or if you have glaucoma or high blood pressure use plain Mucinex. Saline nasal spray help and can safely be used as often as needed for congestion.  If you develop worsening sinus pain, fever or notice severe headache and vision changes, or if symptoms are not better after completion of antibiotic, please schedule an appointment with a health care provider.    Sinus infections are not as easily transmitted as other respiratory infection, however we still recommend that you avoid close contact with loved ones, especially the very young and elderly.  Remember to wash your hands thoroughly throughout the day as this is the number one way to prevent the spread of infection!  Home Care: Only take medications as instructed by your medical team. Complete the entire course of an antibiotic. Do not take these medications with alcohol. A steam or ultrasonic humidifier can help congestion.  You can place a towel over your head and breathe in the steam from hot water coming from a faucet. Avoid close contacts especially the very young and the elderly. Cover your mouth when you cough or sneeze. Always remember to wash your hands.  Get Help Right Away If: You develop worsening fever or sinus pain. You develop a severe head ache or visual changes. Your symptoms persist after you have completed your treatment plan.  Make sure you Understand these  instructions. Will watch your condition. Will get help right away if you are not doing well or get worse.  Your e-visit answers were reviewed by a board certified advanced clinical practitioner to complete your personal care plan.  Depending on the condition, your plan could have included both over the counter or prescription medications.  If there is a problem please reply  once you have received a response from your provider.  Your safety is important to us .  If you have drug allergies check your prescription carefully.    You can use MyChart to ask questions about today's visit, request a non-urgent call back, or ask for a work or school excuse for 24 hours related to this e-Visit. If it has been greater than 24 hours you will need to follow up with your provider, or enter a new e-Visit to address those concerns.  You will get an e-mail in the next two days asking about your experience.  I hope that your e-visit has been valuable and will speed your recovery. Thank you for using e-visits.  I have spent 5 minutes in review of e-visit questionnaire, review and updating patient chart, medical decision making and response to patient.   Ashford Clouse, PA-C

## 2024-07-06 ENCOUNTER — Ambulatory Visit: Admitting: Psychology

## 2024-07-06 DIAGNOSIS — F4323 Adjustment disorder with mixed anxiety and depressed mood: Secondary | ICD-10-CM

## 2024-07-06 NOTE — Progress Notes (Signed)
 [facility] Counselor/Therapist Progress Note  Patient ID: Eric Stokes, MRN: [id number],    Date: 07/06/2024  Time Spent: 10:00am-10:55am   55 minutes   Treatment Type: Individual Therapy  Reported Symptoms: stress, worrying  Mental Status Exam: Appearance:  Casual     Behavior: Appropriate  Motor: Normal  Speech/Language:  Normal Rate  Affect: Appropriate  Mood: normal  Thought process: normal  Thought content:   WNL  Sensory/Perceptual disturbances:   WNL  Orientation: oriented to person, place, time/date, and situation  Attention: Good  Concentration: Good  Memory: WNL  Fund of knowledge:  Good  Insight:   Good  Judgment:  Good  Impulse Control: Good   Risk Assessment: Danger to Self:  No Self-injurious Behavior: No Danger to Others: No Duty to Warn:no Physical Aggression / Violence:No  Access to Firearms a concern: No  Gang Involvement:No   Subjective: Pt present for face-to-face individual therapy in person.  Pt talked about his health.  He has to see a urologist and get blood work and get a CT.   They are trying to figure out what is causing pt's symptoms.  Addressed pt's health concerns. Pt talked about having a doctor come to his house to assess his wife to determine if she can stay in the house or need placement.  They will also make recommendations for adjustments and accommodations for the home.  Pt does not want to move.   His wife does not want to move either and is in denial about her dementia.   Pt has to administer medications to his wife twice daily.  Pt is stressed about the decision making about long term care for his wife as she declines.  Pt and his wife will be married 58 years on December 23rd.   Pt states he has a lot to do to downsize if they need to move.  It is hard for pt to have time to organize since he is doing care giving for his wife.   Pt's youngest daughter is being difficult to deal with bc she is entitled and  expects to get pt's house.  Addressed the issues and how they impact pt.  Addressed how pt can set healthy boundaries.   Worked on problem solving and stress management.   Pt states that he benefits from coming to therapy and venting.   He needs a safe place to voice how hard things are for him and he feels therapy provides that safe place for him.  Provided supportive therapy.   Interventions: Cognitive Behavioral Therapy and Insight-Oriented  Diagnosis:  F43.23  Plan of Care: Recommend ongoing therapy.  Pt participated in setting treatment goals.  Pt wants to improve coping skills and feel better so he can help his wife.  Pt would like to enjoy life more.   Plan to meet every two weeks.   Pt is in agreement with treatment plan.    Treatment Plan Client Abilities/Strengths  Pt is bright, engaging, and motivated for therapy.  Client Treatment Preferences  Individual therapy.  Client Statement of Needs  Improve copings skills. Symptoms  Depressed or irritable mood. Excessive and/or unrealistic worry that is difficult to control occurring more days than not for at least 6 months about a number of events or activities. Hypervigilance (e.g., feeling constantly on edge, experiencing concentration difficulties, having trouble falling or staying asleep, exhibiting a general state of irritability).  Problems Addressed  Unipolar Depression, Anxiety Goals 1. Alleviate depressive symptoms and return  to previous level of effective functioning. 2. Appropriately grieve the loss in order to normalize mood and to return to previously adaptive level of functioning. Objective Learn and implement behavioral strategies to overcome depression. Target Date: 2024-09-29 Frequency: Biweekly  Progress: 10 Modality: individual  Related Interventions Assist the client in developing skills that increase the likelihood of deriving pleasure from behavioral activation (e.g., assertiveness skills, developing an  exercise plan, less internal/more external focus, increased social involvement); reinforce success. Engage the client in behavioral activation, increasing his/her activity level and contact with sources of reward, while identifying processes that inhibit activation. use behavioral techniques such as instruction, rehearsal, role-playing, role reversal, as needed, to facilitate activity in the client's daily life; reinforce success. 3. Develop healthy interpersonal relationships that lead to the alleviation and help prevent the relapse of depression. 4. Develop healthy thinking patterns and beliefs about self, others, and the world that lead to the alleviation and help prevent the relapse of depression. 5. Enhance ability to effectively cope with the full variety of life's worries and anxieties. 6. Learn and implement coping skills that result in a reduction of anxiety and worry, and improved daily functioning. Objective Learn and implement problem-solving strategies for realistically addressing worries. Target Date: 2024-09-29 Frequency: Biweekly  Progress: 10 Modality: individual  Related Interventions Assign the client a homework exercise in which he/she problem-solves a current problem (see Mastery of Your Anxiety and Worry: Workbook by Richarda and Jonne or Generalized Anxiety Disorder by Delores Filler, and Jonne); review, reinforce success, and provide corrective feedback toward improvement. Teach the client problem-solving strategies involving specifically defining a problem, generating options for addressing it, evaluating the pros and cons of each option, selecting and implementing an optional action, and reevaluating and refining the action. Objective Learn and implement calming skills to reduce overall anxiety and manage anxiety symptoms. Target Date: 2024-09-29 Frequency: Biweekly  Progress: 10 Modality: individual  Related Interventions Assign the client to read about progressive  muscle relaxation and other calming strategies in relevant books or treatment manuals (e.g., Progressive Relaxation Training by Thornell armin Collier; Mastery of Your Anxiety and Worry: Workbook by Richarda armin Jonne). Assign the client homework each session in which he/she practices relaxation exercises daily, gradually applying them progressively from non-anxiety-provoking to anxiety-provoking situations; review and reinforce success while providing corrective feedback toward improvement. Teach the client calming/relaxation skills (e.g., applied relaxation, progressive muscle relaxation, cue controlled relaxation; mindful breathing; biofeedback) and how to discriminate better between relaxation and tension; teach the client how to apply these skills to his/her daily life. 7. Recognize, accept, and cope with feelings of depression. 8. Reduce overall frequency, intensity, and duration of the anxiety so that daily functioning is not impaired. 9. Resolve the core conflict that is the source of anxiety. 10. Stabilize anxiety level while increasing ability to function on a daily basis. Diagnosis F43.23   Conditions For Discharge Achievement of treatment goals and objectives   Veva Alma, LCSW

## 2024-07-08 ENCOUNTER — Emergency Department (HOSPITAL_COMMUNITY)

## 2024-07-08 ENCOUNTER — Encounter (HOSPITAL_COMMUNITY): Payer: Self-pay | Admitting: Emergency Medicine

## 2024-07-08 ENCOUNTER — Emergency Department (HOSPITAL_COMMUNITY)
Admission: EM | Admit: 2024-07-08 | Discharge: 2024-07-08 | Disposition: A | Discharge status: Home / Self Care | Attending: Emergency Medicine | Admitting: Emergency Medicine

## 2024-07-08 DIAGNOSIS — J449 Chronic obstructive pulmonary disease, unspecified: Secondary | ICD-10-CM | POA: Insufficient documentation

## 2024-07-08 DIAGNOSIS — R4789 Other speech disturbances: Secondary | ICD-10-CM

## 2024-07-08 DIAGNOSIS — R600 Localized edema: Secondary | ICD-10-CM | POA: Diagnosis not present

## 2024-07-08 DIAGNOSIS — I5020 Unspecified systolic (congestive) heart failure: Secondary | ICD-10-CM | POA: Diagnosis not present

## 2024-07-08 DIAGNOSIS — Z8546 Personal history of malignant neoplasm of prostate: Secondary | ICD-10-CM | POA: Diagnosis not present

## 2024-07-08 DIAGNOSIS — I2510 Atherosclerotic heart disease of native coronary artery without angina pectoris: Secondary | ICD-10-CM | POA: Diagnosis not present

## 2024-07-08 DIAGNOSIS — Z7901 Long term (current) use of anticoagulants: Secondary | ICD-10-CM | POA: Diagnosis not present

## 2024-07-08 DIAGNOSIS — I110 Hypertensive heart disease with heart failure: Secondary | ICD-10-CM | POA: Insufficient documentation

## 2024-07-08 DIAGNOSIS — Z7951 Long term (current) use of inhaled steroids: Secondary | ICD-10-CM | POA: Insufficient documentation

## 2024-07-08 DIAGNOSIS — Z79899 Other long term (current) drug therapy: Secondary | ICD-10-CM | POA: Diagnosis not present

## 2024-07-08 DIAGNOSIS — I4891 Unspecified atrial fibrillation: Secondary | ICD-10-CM | POA: Diagnosis not present

## 2024-07-08 DIAGNOSIS — G459 Transient cerebral ischemic attack, unspecified: Secondary | ICD-10-CM | POA: Insufficient documentation

## 2024-07-08 DIAGNOSIS — Z951 Presence of aortocoronary bypass graft: Secondary | ICD-10-CM | POA: Diagnosis not present

## 2024-07-08 LAB — DIFFERENTIAL
Abs Immature Granulocytes: 0.01 K/uL (ref 0.00–0.07)
Basophils Absolute: 0 K/uL (ref 0.0–0.1)
Basophils Relative: 1 %
Eosinophils Absolute: 0.1 K/uL (ref 0.0–0.5)
Eosinophils Relative: 1 %
Immature Granulocytes: 0 %
Lymphocytes Relative: 20 %
Lymphs Abs: 1.1 K/uL (ref 0.7–4.0)
Monocytes Absolute: 0.4 K/uL (ref 0.1–1.0)
Monocytes Relative: 8 %
Neutro Abs: 4 K/uL (ref 1.7–7.7)
Neutrophils Relative %: 70 %

## 2024-07-08 LAB — COMPREHENSIVE METABOLIC PANEL WITH GFR
ALT: 22 U/L (ref 0–44)
AST: 28 U/L (ref 15–41)
Albumin: 3.7 g/dL (ref 3.5–5.0)
Alkaline Phosphatase: 39 U/L (ref 38–126)
Anion gap: 12 (ref 5–15)
BUN: 16 mg/dL (ref 8–23)
CO2: 23 mmol/L (ref 22–32)
Calcium: 9 mg/dL (ref 8.9–10.3)
Chloride: 105 mmol/L (ref 98–111)
Creatinine, Ser: 1.23 mg/dL (ref 0.61–1.24)
GFR, Estimated: 60 mL/min — ABNORMAL LOW (ref >60)
Glucose, Bld: 95 mg/dL (ref 70–99)
Potassium: 4.2 mmol/L (ref 3.5–5.1)
Sodium: 140 mmol/L (ref 135–145)
Total Bilirubin: 1 mg/dL (ref 0.0–1.2)
Total Protein: 6.9 g/dL (ref 6.5–8.1)

## 2024-07-08 LAB — CBC
HCT: 39.3 % (ref 39.0–52.0)
Hemoglobin: 13.6 g/dL (ref 13.0–17.0)
MCH: 31.3 pg (ref 26.0–34.0)
MCHC: 34.6 g/dL (ref 30.0–36.0)
MCV: 90.3 fL (ref 80.0–100.0)
Platelets: 151 K/uL (ref 150–400)
RBC: 4.35 MIL/uL (ref 4.22–5.81)
RDW: 13.2 % (ref 11.5–15.5)
WBC: 5.6 K/uL (ref 4.0–10.5)
nRBC: 0 % (ref 0.0–0.2)

## 2024-07-08 LAB — I-STAT CHEM 8, ED
BUN: 20 mg/dL (ref 8–23)
Calcium, Ion: 1.14 mmol/L — ABNORMAL LOW (ref 1.15–1.40)
Chloride: 106 mmol/L (ref 98–111)
Creatinine, Ser: 1.1 mg/dL (ref 0.61–1.24)
Glucose, Bld: 94 mg/dL (ref 70–99)
HCT: 39 % (ref 39.0–52.0)
Hemoglobin: 13.3 g/dL (ref 13.0–17.0)
Potassium: 4.2 mmol/L (ref 3.5–5.1)
Sodium: 139 mmol/L (ref 135–145)
TCO2: 24 mmol/L (ref 22–32)

## 2024-07-08 LAB — PROTIME-INR
INR: 1.2 (ref 0.8–1.2)
Prothrombin Time: 16.2 s — ABNORMAL HIGH (ref 11.4–15.2)

## 2024-07-08 LAB — MAGNESIUM: Magnesium: 2 mg/dL (ref 1.7–2.4)

## 2024-07-08 LAB — CBG MONITORING, ED: Glucose-Capillary: 99 mg/dL (ref 70–99)

## 2024-07-08 LAB — TROPONIN I (HIGH SENSITIVITY)
Troponin I (High Sensitivity): 11 ng/L (ref <18)
Troponin I (High Sensitivity): 13 ng/L (ref <18)

## 2024-07-08 LAB — ETHANOL: Alcohol, Ethyl (B): <15 mg/dL (ref <15)

## 2024-07-08 LAB — APTT: aPTT: 36 s (ref 24–36)

## 2024-07-08 MED ORDER — SODIUM CHLORIDE 0.9% FLUSH
3.0000 mL | Freq: Once | INTRAVENOUS | Status: AC
  Administered 2024-07-08: 3 mL via INTRAVENOUS

## 2024-07-08 MED ORDER — IOHEXOL 350 MG/ML SOLN
75.0000 mL | Freq: Once | INTRAVENOUS | Status: AC | PRN
  Administered 2024-07-08: 75 mL via INTRAVENOUS

## 2024-07-08 NOTE — Discharge Instructions (Signed)
 I discussed the plan for discharge with the patient and/or their surrogate at bedside prior to discharge and they were in agreement with the plan and verbalized understanding of the return precautions provided. All questions answered to the best of my ability. Ultimately, the patient was discharged in stable condition with stable vital signs. I am reassured that they are capable of close follow up and good social support at home.

## 2024-07-08 NOTE — ED Triage Notes (Signed)
 Pt  here form home with c/o  gen weakness and memory loss while working in yard today , no fevers

## 2024-07-08 NOTE — ED Provider Notes (Signed)
 [place] EMERGENCY DEPARTMENT AT [facility] Provider Note   CSN: [id number] Arrival date & time: 07/08/24  1613     Patient presents with: Weakness   Eric Stokes is a 78 y.o. male.   Hx of obesity, HTN, OSA on CPAP, HL, prostate CA, ? Asthma vs COPD, PAF, CAD s/p CABG 05/18/20 and systolic HF (40-45%) presenting with weakness, word finding difficulty that started today.  History per patient, EMS report.  Patient was working in the OR today, suddenly felt weak, and was having some difficulty trying to find words.  He is currently gardening, however was having difficulty trying to remember the words for broccoli, which he is currently harvesting, as well as the scientific term for it.  He is able to remember it at this time.  BG 143.  This episode lasted for several minutes, last known well was approximately 2 PM today.  However, family was concerned that patient may be having a stroke, therefore EMS was called.  Patient endorses that he still feels mildly weak at this time, however does feel significantly improved and that he does not have word finding difficulties at this time.  Denies chest pain or shortness of breath, denies nausea or vomiting, denies fever or chills.  Endorses he has been compliant with his medications.        Prior to Admission medications   Medication Sig Start Date End Date Taking? Authorizing Provider  acetaminophen  (TYLENOL ) 500 MG tablet Take 500-1,000 mg by mouth every 6 (six) hours as needed (pain.).    [provider]  albuterol  (VENTOLIN  HFA) 108 (90 Base) MCG/ACT inhaler Inhale 1 puff into the lungs as needed for wheezing or shortness of breath. 09/26/15   [provider]  alfuzosin  (UROXATRAL ) 10 MG 24 hr tablet Take 10 mg by mouth at bedtime.    [provider]  apixaban  (ELIQUIS ) 5 MG TABS tablet Take 1 tablet (5 mg total) by mouth 2 (two) times daily. 02/25/24   Nieves Cough, MD  cetirizine (ZYRTEC) 10 MG  tablet Take 10 mg by mouth at bedtime. 01/16/13   [provider]  Cholecalciferol  (VITAMIN D ) 50 MCG (2000 UT) tablet Take 2,000 Units by mouth daily.    [provider]  fluticasone  (FLONASE ) 50 MCG/ACT nasal spray USE TWO SPRAYS IN EACH NOSTRIL DAILY AS NEEDED. 05/02/20   Jodie Lavern LITTIE, MD  furosemide  (LASIX ) 20 MG tablet Take 1 tablet (20 mg total) by mouth daily as needed. 04/05/24   [place], Harlene HERO, FNP  ketotifen (ZADITOR) 0.025 % ophthalmic solution Place 1 drop into both eyes daily as needed (allergies). 12/12/20   [provider]  latanoprost  (XALATAN ) 0.005 % ophthalmic solution INSTILL 1 DROP INTO BOTH EYES AT BEDTIME 08/30/20   Jodie Lavern LITTIE, MD  metoprolol  succinate (TOPROL -XL) 50 MG 24 hr tablet Take 1 tablet (50 mg total) by mouth daily. Take with or immediately following a meal. 05/23/24 08/21/24  Bensimhon, Toribio SAUNDERS, MD  montelukast  (SINGULAIR ) 10 MG tablet TAKE 1 TABLET BY MOUTH EVERY DAY AT BEDTIME 05/27/20   Jodie Lavern LITTIE, MD  nitroGLYCERIN  (NITROSTAT ) 0.4 MG SL tablet Place 1 tablet (0.4 mg total) under the tongue every 5 (five) minutes as needed for chest pain. 05/23/24 08/21/24  Bensimhon, Toribio SAUNDERS, MD  NON FORMULARY Cpap at night    [provider]  rosuvastatin  (CRESTOR ) 20 MG tablet Take 1 tablet (20 mg total) by mouth daily. 07/31/20   Bensimhon, Toribio SAUNDERS, MD  Allergies: Flomax  [tamsulosin ], Cipro [ciprofloxacin hcl], Lipitor [atorvastatin], Lisinopril, Farxiga [dapagliflozin], and Spironolactone     Review of Systems  Updated Vital Signs BP (!) 141/85   Pulse 67   Temp (!) 97.5 F (36.4 C) (Oral)   Resp 18   SpO2 96%   Physical Exam Vitals and nursing note reviewed.  Constitutional:      General: He is not in acute distress.    Appearance: Normal appearance. He is not ill-appearing.  HENT:     Head: Normocephalic and atraumatic.     Mouth/Throat:     Mouth: Mucous membranes are moist.     Pharynx: Oropharynx is clear.   Eyes:     Extraocular Movements: Extraocular movements intact.     Pupils: Pupils are equal, round, and reactive to light.  Cardiovascular:     Rate and Rhythm: Normal rate and regular rhythm.     Pulses: Normal pulses.     Heart sounds: Normal heart sounds. No murmur heard.    No gallop.  Pulmonary:     Effort: Pulmonary effort is normal. No respiratory distress.     Breath sounds: Normal breath sounds. No stridor. No wheezing, rhonchi or rales.  Abdominal:     General: Abdomen is flat. There is no distension.     Palpations: Abdomen is soft.     Tenderness: There is no abdominal tenderness. There is no right CVA tenderness, left CVA tenderness, guarding or rebound.  Musculoskeletal:     Cervical back: Normal range of motion and neck supple. No rigidity.     Right lower leg: Edema present.     Left lower leg: Edema present.  Skin:    Capillary Refill: Capillary refill takes less than 2 seconds.  Neurological:     General: No focal deficit present.     Mental Status: He is alert and oriented to person, place, and time.     Cranial Nerves: No cranial nerve deficit.     Sensory: No sensory deficit.     Motor: No weakness.     Coordination: Coordination normal.     Gait: Gait normal.     Comments: Cranial nerves II through XII intact, normal motor and sensory function appreciated.  PERRL, EOMI, normal peripheral fields appreciated.  Normal coordination.  Appreciable normal gait.     (all labs ordered are listed, but only abnormal results are displayed) Labs Reviewed  PROTIME-INR - Abnormal; Notable for the following components:      Result Value   Prothrombin Time 16.2 (*)    All other components within normal limits  COMPREHENSIVE METABOLIC PANEL WITH GFR - Abnormal; Notable for the following components:   GFR, Estimated 60 (*)    All other components within normal limits  I-STAT CHEM 8, ED - Abnormal; Notable for the following components:   Calcium , Ion 1.14 (*)    All  other components within normal limits  APTT  CBC  DIFFERENTIAL  ETHANOL  MAGNESIUM   CBG MONITORING, ED  TROPONIN I (HIGH SENSITIVITY)  TROPONIN I (HIGH SENSITIVITY)    EKG: EKG Interpretation Date/Time:  Friday July 08 2024 16:20:16 EST Ventricular Rate:  66 PR Interval:  210 QRS Duration:  112 QT Interval:  436 QTC Calculation: 457 R Axis:   -41  Text Interpretation: Sinus arrhythmia Paired ventricular premature complexes Incomplete RBBB and LAFB No significant change since last tracing Confirmed by Patt Alm DEL [phone]) on 07/08/2024 4:31:15 PM  Radiology: MR BRAIN WO CONTRAST Result Date: 07/08/2024 EXAM:  MRI BRAIN WITHOUT CONTRAST 07/08/2024 05:37:00 PM TECHNIQUE: Multiplanar multisequence MRI of the head/brain was performed without the administration of intravenous contrast. COMPARISON: CTA head/neck today CLINICAL HISTORY: Neuro deficit, acute, stroke suspected FINDINGS: BRAIN AND VENTRICLES: No acute infarct. No intracranial hemorrhage. No mass. No midline shift. No hydrocephalus. The sella is unremarkable. Normal flow voids. ORBITS: No acute abnormality. SINUSES AND MASTOIDS: No acute abnormality. BONES AND SOFT TISSUES: Normal marrow signal. No acute soft tissue abnormality. IMPRESSION: 1.No acute intracranial abnormality. Electronically signed by: Gilmore Molt MD 07/08/2024 06:15 PM EST RP Workstation: [id number]   DG Chest Portable 1 View Result Date: 07/08/2024 CLINICAL DATA:  Shortness of breath EXAM: PORTABLE CHEST 1 VIEW COMPARISON:  Chest x-ray 04/24/2022 FINDINGS: The heart is enlarged. Patient is status post cardiac surgery. The lungs are clear. There is no pleural effusion or pneumothorax. No acute fractures are seen. IMPRESSION: Cardiomegaly. No acute cardiopulmonary process. Electronically Signed   By: Greig Pique M.D.   On: 07/08/2024 18:05   CT ANGIO HEAD NECK W WO CM Result Date: 07/08/2024 EXAM: CT HEAD WITHOUT CTA HEAD AND NECK WITH AND WITHOUT  07/08/2024 05:06:22 PM TECHNIQUE: CTA of the head and neck was performed with and without the administration of intravenous contrast. Noncontrast CT of the head with reconstructed 2-D images are also provided for review. Multiplanar 2D and/or 3D reformatted images are provided for review. Automated exposure control, iterative reconstruction, and/or weight based adjustment of the mA/kV was utilized to reduce the radiation dose to as low as reasonably achievable. 75 mL of iohexol  (OMNIPAQUE ) 350 MG/ML injection was administered. COMPARISON: MR head without contrast 03/01/2019. CT angio head and neck 02/28/2019. CLINICAL HISTORY: Acute onset of generalized weakness and memory loss while working in the yard. FINDINGS: CT HEAD: BRAIN AND VENTRICLES: No acute intracranial hemorrhage. No mass effect. No midline shift. No extra-axial fluid collection. No evidence of acute infarct. No hydrocephalus. ORBITS: No acute abnormality. SINUSES AND MASTOIDS: No acute abnormality. CTA NECK: AORTIC ARCH AND ARCH VESSELS: Atherosclerotic calcifications are again noted at the origin of the left subclavian artery and distal aortic arch without significant stenosis or aneurysm. No dissection or arterial injury. No significant stenosis of the brachiocephalic or subclavian arteries. CERVICAL CAROTID ARTERIES: Atherosclerotic calcifications of the right carotid bifurcation are similar to the prior study. No focal stenosis is present. Atherosclerotic calcifications at the left carotid bifurcation and proximal left ICA are similar to the prior study without significant stenosis. No dissection, arterial injury, or hemodynamically significant stenosis by NASCET criteria. CERVICAL VERTEBRAL ARTERIES: The left vertebral artery is a dominant vessel. Atherosclerotic calcifications are present at the dural margin of both vertebral arteries with moderate stenosis bilaterally. The right vertebral artery terminates at the PICA. No dissection or arterial  injury. LUNGS AND MEDIASTINUM: Unremarkable. SOFT TISSUES: No acute abnormality. BONES: No acute abnormality. CTA HEAD: ANTERIOR CIRCULATION: Atherosclerotic calcifications are present within the cavernous internal carotid arteries bilaterally without significant stenosis. No significant stenosis of the anterior cerebral arteries. No significant stenosis of the middle cerebral arteries. No aneurysm. POSTERIOR CIRCULATION: No significant stenosis of the posterior cerebral arteries. No significant stenosis of the basilar artery. No significant stenosis of the vertebral arteries. No aneurysm. OTHER: No dural venous sinus thrombosis on this non-dedicated study. IMPRESSION: 1. No acute intracranial abnormality. 2. No large vessel occlusion in the head or neck. 3. Relatively stable moderate bilateral vertebral artery stenosis at the dural margin. Electronically signed by: Lonni Necessary MD 07/08/2024 05:25 PM EST RP Workstation: [id number]  Procedures   Medications Ordered in the ED  sodium chloride  flush (NS) 0.9 % injection 3 mL (3 mLs Intravenous Given 07/08/24 1635)  iohexol  (OMNIPAQUE ) 350 MG/ML injection 75 mL (75 mLs Intravenous Contrast Given 07/08/24 1707)    Clinical Course as of 07/09/24 0045  Fri Jul 08, 2024  1935 I spoke with the patient, imaging overall reassuring, lab workup overall reassuring.  I discussed the risk and benefits for undergoing further testing at this time, patient family was requesting for discharge at this time will follow-up with neurology outpatient.  They already have follow-up scheduled with cardiology, nephrology, and are happy to see a neurologist as well. [BS]    Clinical Course User Index [BS] Arlee Katz, MD                                 Medical Decision Making Amount and/or Complexity of Data Reviewed Labs: ordered. Radiology: ordered.  Risk Prescription drug management.   On initial patient arrival, patient appears neurologically  intact, will hold on activating code stroke at this time. Will receive CTA head and neck, and will also receive an MRI of the brain.    Based on patient presentation, history, evaluation, high suspicion for possible TIA.  Per patient evaluation workup, low suspicion for stroke versus intracranial bleed versus vascular obstruction versus carotid or vertebral dissection.  Per lab workup, low suspicion for ACS.  Low suspicion for acute intoxication.  Patient with very minor word finding difficulties, primarily not able to remember the scientific term for properly.  However, patient endorses that he is currently at his baseline and is not having to significantly finding appreciation.  I spoke with the family concerning the concern for TIA, recommendation to follow-up with neurology.  We offered the option to discuss with neurology and patient today, however patient family requesting to discharge home at this time.  They have follow-up appointments with cardiology as well, and are agreeable to follow-up with neurology, referral was placed today.  Overall with patient reassuring evaluation today, and responsible follow-up in association with patient family who are in the healthcare system, patient is stable for discharge at this time.  Recommendation to also follow-up with PCP in 2 to 3 days.     Final diagnoses:  TIA (transient ischemic attack)  Word finding difficulty    ED Discharge Orders          Ordered    Ambulatory referral to Neurology       Comments: An appointment is requested in approximately: 1 week   07/08/24 ULYESS Arlee Katz, MD 07/09/24 0045    Patt Alm Macho, MD 07/09/24 [phone]

## 2024-07-11 ENCOUNTER — Encounter: Payer: Self-pay | Admitting: Neurology

## 2024-07-25 ENCOUNTER — Ambulatory Visit (HOSPITAL_COMMUNITY)
Admission: RE | Admit: 2024-07-25 | Discharge: 2024-07-25 | Disposition: A | Source: Ambulatory Visit | Attending: Cardiovascular Disease | Admitting: Cardiovascular Disease

## 2024-07-25 DIAGNOSIS — I350 Nonrheumatic aortic (valve) stenosis: Secondary | ICD-10-CM

## 2024-07-25 NOTE — Progress Notes (Unsigned)
 Structural Heart Clinic Note  No chief complaint on file.  History of Present Illness: 78 yo male with history of CAD s/p 4V CABG in 2021, LAA clipping, MAZE, chronic systolic CHF, PAF, obesity, COPD, HLD, prostate cancer and severe aortic stenosis who is here today for follow up. I saw him as a new consult in September 2025 to discuss his aortic stenosis and possible TAVR. He described feeling well overall. He was admitted in 2021 with atrial fibrillation with RVR. Echo with LVEF=25-30%. Cardiac cath with severe left main and three vessel CAD. He underwent CABG with MAZE and LAA clipping in October 2021. LVEF improved to 40-45% post CABG. He has been followed in our Advanced Heart Failure Clinic by Dr. Cherrie. Echo at the [facility] [address] 03/01/24 with LVEF=50-55% with moderate to severe aortic valve stenosis with peak aortic velocity 364 cm/s, mean gradient 32 mmHg, peak gradient 53 mm Hg, AVA 1.1 cm2. Moderate MR. Cardiac monitor September 2025 with sinus, 61 runs of VT. 3495 runs of SVT.  Frequent PVCs (5.5% burden). He was seen in the AHF clinic in October 2025 and reported feeling well. He was seen in the ED 07/08/24 with stroke like symptoms and had an unremarkable head CTA and MRI. He was felt to have a TIA.   He is here today for follow up. The patient denies any chest pain, dyspnea, palpitations, lower extremity edema, orthopnea, PND, dizziness, near syncope or syncope.   He lives in [place]. His daughter could not come today but she listened on his phone. He is retired from holiday representative.   Primary Care Physician: Kenney Clotilda PARAS, PA Primary Cardiologist: Bensimhon Referring Cardiologist: Bensimhon  Past Medical History:  Diagnosis Date   Aortic stenosis, mild    last echo in epic 05-27-2020  ef 40-45%,  mean grandiant , valve area 2.46 cm^2 ,  mild regurg   BPH with obstruction/lower urinary tract symptoms    urologist--- dr nieves   Carotid bruit present 06/15/2018   2015  Carotid Dopplers:  Essentially normal carotid arteries, with very slight hard plaque in the proximal ICA's and serpentine distal vessels. 1-39% bilateral ICA stenosis. Patent vertebral arteries with antegrade flow. Normal subclavian arteries, bilaterally.   Chronic systolic CHF (congestive heart failure) (HCC) 05/2020   followed by dr jaclynn   COPD (chronic obstructive pulmonary disease) (HCC)    Coronary artery disease 04/2020   cardiologist--- dr cherrie;   NSTEMI /  Afib w/ RVR/  Acute CHF/  cardiogenic shock 04-20-2020;  cath 05-17-2020 multivessel disease w/ critical LM;   05-18-2020 s/p CABG x4 and Maze procedure;  nuclear stress study done [facility] 09-17-2021 no ischemia, ef 54%   Essential hypertension, benign 12/21/2008   Qualifier: Diagnosis of  By: Lavona, MD, CODY Agent     Glaucoma, both eyes    History of migraine    History of non-ST elevation myocardial infarction (NSTEMI) 05/17/2020   cardiogenic shock,  AF w/ RVR, acute CHF,  ICM;   s/p CABG w/ MAZE   History of squamous cell carcinoma of skin    History of TIA (transient ischemic attack) 02/28/2019   questionable TIA  per neurology note dr rosemarie, 04-12-2019, likely migraine   Hx of colonic polyps 01/09/2020   Hyperlipidemia    Ischemic cardiomyopathy 04/2020   per cath 09-03-20212  ef 25-30%;   last echo in epic 05-27-2020  ef 40-45%   Malignant neoplasm prostate (HCC) 10/2019   urologist--- dr nieves;  dx 03/ 2021  active  survillance   Myocardial infarction (HCC)    OA (osteoarthritis)    OSA on CPAP    Diagnosed at the [facility] 2016    PAF (paroxysmal atrial fibrillation) (HCC) 04/2020   followed by cardiology---  s/p MAZE procedure 05-18-2020, takes ASA   PVC's (premature ventricular contractions)    S/P CABG x 4 05/18/2020   LIMA to LAD,  RIMA to PDA, SVG to OM1 and OM2   Seasonal allergic rhinitis    Sinus bradycardia    event monitor 01-29-2017  mild bradycardia, no pauses, rare PVCs   Vitamin B  12 deficiency    Vitamin D  deficiency 11/26/2010   Wears hearing aid in both ears     Past Surgical History:  Procedure Laterality Date   CATARACT EXTRACTION W/ INTRAOCULAR LENS IMPLANT Bilateral    CLIPPING OF ATRIAL APPENDAGE Left 05/18/2020   Procedure: CLIPPING OF ATRIAL APPENDAGE USING ATRICURE 45 MM ATRICLIP;  Surgeon: German Bartlett PEDLAR, MD;  Location: [facility] OR;  Service: Open Heart Surgery;  Laterality: Left;   COLONOSCOPY     per pt last one approx 2019  by dr kristie   CORONARY ARTERY BYPASS GRAFT N/A 05/18/2020   Procedure: CORONARY ARTERY BYPASS GRAFTING (CABG) TIMES FOUR USING INTERNAL BILATERAL MAMMARY ARTERIES AND HARVESTED LEFT RADIAL ARTERY.;  Surgeon: German Bartlett PEDLAR, MD;  Location: [facility] OR;  Service: Open Heart Surgery;  Laterality: N/A;  CORONARY ENDARTERECTOMY PERFORMED DURING SURGERY    CYSTOSCOPY WITH URETHRAL DILATATION N/A 10/28/2022   Procedure: CYSTOSCOPY WITH BLADDER NECK DILATATION;  Surgeon: Nieves Cough, MD;  Location: [facility]S;  Service: Urology;  Laterality: N/A;  75 MINS   GOLD SEED IMPLANT N/A 08/26/2022   Procedure: GOLD SEED IMPLANT;  Surgeon: Devere Lonni Righter, MD;  Location: [facility];  Service: Urology;  Laterality: N/A;   IABP INSERTION N/A 05/17/2020   Procedure: IABP INSERTION;  Surgeon: Cherrie Toribio SAUNDERS, MD;  [facility]: [facility];  Service: Cardiovascular;  Laterality: N/A;   INSERTION OF SUPRAPUBIC CATHETER N/A 10/10/2022   Procedure: INSERTION OF SUPRAPUBIC CATHETER cystoscopy urethral dilation;  Surgeon: Lovie Arlyss CROME, MD;  Location: [facility];  Service: Urology;  Laterality: N/A;   KNEE ARTHROPLASTY Right 03/14/2020   Procedure: COMPUTER ASSISTED TOTAL KNEE ARTHROPLASTY;  Surgeon: Fidel Rogue, MD;  Location: [facility];  Service: Orthopedics;  Laterality: Right;   LAPAROSCOPIC CHOLECYSTECTOMY  02/26/2000   @[facility]   by dr merrilyn   MAZE N/A 05/18/2020   Procedure: MAZE USING ATRICURE ISOLATOR CLAMP OLL2;  Surgeon:  German Bartlett PEDLAR, MD;  Location: [facility];  Service: Open Heart Surgery;  Laterality: N/A;   RADIAL ARTERY HARVEST Left 05/18/2020   Procedure: RADIAL ARTERY HARVEST;  Surgeon: German Bartlett PEDLAR, MD;  [facility];  Service: Open Heart Surgery;  Laterality: Left;   RIGHT HEART CATH N/A 05/17/2020   Procedure: RIGHT HEART CATH;  Surgeon: Cherrie Toribio SAUNDERS, MD;  [facility];  Service: Cardiovascular;  Laterality: N/A;   RIGHT/LEFT HEART CATH AND CORONARY ANGIOGRAPHY N/A 05/17/2020   Procedure: RIGHT/LEFT HEART CATH AND CORONARY ANGIOGRAPHY;  Surgeon: Court Dorn PARAS, MD;  [facility];  Service: Cardiovascular;  Laterality: N/A;   SPACE OAR INSTILLATION N/A 08/26/2022   Procedure: SPACE OAR INSTILLATION;  Surgeon: Devere Lonni Righter, MD;  Location: [facility];  Service: Urology;  Laterality: N/A;  30 MINS FOR CASE   TEE WITHOUT CARDIOVERSION N/A 05/18/2020   Procedure: TRANSESOPHAGEAL ECHOCARDIOGRAM (  TEE);  Surgeon: German Bartlett PEDLAR, MD;  Location: [facility] OR;  Service: Open Heart Surgery;  Laterality: N/A;   THULIUM LASER TURP (TRANSURETHRAL RESECTION OF PROSTATE) N/A 04/15/2022   Procedure: JULIE LASER VAPORIZATION OF PROSTATE;  Surgeon: Nieves Cough, MD;  Location: [facility];  Service: Urology;  Laterality: N/A;  90 MINS FOR CASE   TONSILLECTOMY AND ADENOIDECTOMY  1952   TRANSURETHRAL RESECTION OF PROSTATE N/A 04/15/2022   TRANSURETHRAL RESECTION OF PROSTATE  10/28/2022   Procedure: TRANSURETHRAL RESECTION OF THE PROSTATE (TURP);  Surgeon: Nieves Cough, MD;  [facility];  Service: Urology;;   TRANSURETHRAL RESECTION OF PROSTATE N/A 05/19/2023   Procedure: TRANSURETHRAL RESECTION OF THE PROSTATE (TURP);  Surgeon: Nieves Cough, MD;  [facility];  Service: Urology;  Laterality: N/A;   TRANSURETHRAL RESECTION OF PROSTATE N/A 02/23/2024   Procedure: TURP (TRANSURETHRAL RESECTION OF PROSTATE);   Surgeon: Nieves Cough, MD;  [facility];  Service: Urology;  Laterality: N/A;    Current Outpatient Medications  Medication Sig Dispense Refill   acetaminophen  (TYLENOL ) 500 MG tablet Take 500-1,000 mg by mouth every 6 (six) hours as needed (pain.).     albuterol  (VENTOLIN  HFA) 108 (90 Base) MCG/ACT inhaler Inhale 1 puff into the lungs as needed for wheezing or shortness of breath.     alfuzosin  (UROXATRAL ) 10 MG 24 hr tablet Take 10 mg by mouth at bedtime.     apixaban  (ELIQUIS ) 5 MG TABS tablet Take 1 tablet (5 mg total) by mouth 2 (two) times daily.     cetirizine (ZYRTEC) 10 MG tablet Take 10 mg by mouth at bedtime.     Cholecalciferol  (VITAMIN D ) 50 MCG (2000 UT) tablet Take 2,000 Units by mouth daily.     fluticasone  (FLONASE ) 50 MCG/ACT nasal spray USE TWO SPRAYS IN EACH NOSTRIL DAILY AS NEEDED. 48 mL 0   furosemide  (LASIX ) 20 MG tablet Take 1 tablet (20 mg total) by mouth daily as needed. 30 tablet 3   ketotifen (ZADITOR) 0.025 % ophthalmic solution Place 1 drop into both eyes daily as needed (allergies).     latanoprost  (XALATAN ) 0.005 % ophthalmic solution INSTILL 1 DROP INTO BOTH EYES AT BEDTIME 11.25 mL 1   metoprolol  succinate (TOPROL -XL) 50 MG 24 hr tablet Take 1 tablet (50 mg total) by mouth daily. Take with or immediately following a meal. 90 tablet 3   montelukast  (SINGULAIR ) 10 MG tablet TAKE 1 TABLET BY MOUTH EVERY DAY AT BEDTIME 90 tablet 3   nitroGLYCERIN  (NITROSTAT ) 0.4 MG SL tablet Place 1 tablet (0.4 mg total) under the tongue every 5 (five) minutes as needed for chest pain. 90 tablet 3   NON FORMULARY Cpap at night     rosuvastatin  (CRESTOR ) 20 MG tablet Take 1 tablet (20 mg total) by mouth daily. 90 tablet 3   No current facility-administered medications for this visit.    Allergies  Allergen Reactions   Flomax  [Tamsulosin ] Other (See Comments)    dizziness   Cipro [Ciprofloxacin Hcl] Swelling    Knee swelling, joint pain   Lipitor [Atorvastatin]  Other (See Comments)    Leg Cramps   Lisinopril Cough   Farxiga [Dapagliflozin] Other (See Comments)    Had UTI on Farxiga and Jardiance    Spironolactone  Other (See Comments)    Grew breasts     Social History   Socioeconomic History   Marital status: Married    Spouse name: Not on file   Number of children: 2  Years of education: 62   Highest education level: Not on file  Occupational History   Occupation: retired   Occupation: Retired holiday representative  Tobacco Use   Smoking status: Former    Current packs/day: 0.00    Types: Cigarettes    Start date: 1964    Quit date: 1967    Years since quitting: 58.9    Passive exposure: Past   Smokeless tobacco: Never  Vaping Use   Vaping status: Never Used  Substance and Sexual Activity   Alcohol  use: Yes    Comment: Occasional beer   Drug use: Never   Sexual activity: Yes  Other Topics Concern   Not on file  Social History Narrative   Is a [place] veteran, has trouble sleeping most nights. Often experiences nightmares w/anesthesia meds.   Social Drivers of Corporate Investment Banker Strain: Low Risk  (03/05/2020)   Overall Financial Resource Strain (CARDIA)    Difficulty of Paying Living Expenses: Not hard at all  Food Insecurity: No Food Insecurity (09/13/2021)   Received from [facility]   Hunger Vital Sign    Within the past 12 months, you worried that your food would run out before you got the money to buy more.: Never true    Within the past 12 months, the food you bought just didn't last and you didn't have money to get more.: Never true  Transportation Needs: No Transportation Needs (03/05/2020)   PRAPARE - Administrator, Civil Service (Medical): No    Lack of Transportation (Non-Medical): No  Physical Activity: Unknown (03/05/2020)   Exercise Vital Sign    Days of Exercise per Week: 7 days    Minutes of Exercise per Session: Not on file  Stress: No Stress Concern Present (03/05/2020)   [employer] of Occupational Health - Occupational Stress Questionnaire    Feeling of Stress : Not at all  Social Connections: Moderately Isolated (03/05/2020)   Social Connection and Isolation Panel    Frequency of Communication with Friends and Family: More than three times a week    Frequency of Social Gatherings with Friends and Family: More than three times a week    Attends Religious Services: Never    Database Administrator or Organizations: No    Attends Banker Meetings: Never    Marital Status: Married  Catering Manager Violence: Not At Risk (03/05/2020)   Humiliation, Afraid, Rape, and Kick questionnaire    Fear of Current or Ex-Partner: No    Emotionally Abused: No    Physically Abused: No    Sexually Abused: No    Family History  Problem Relation Age of Onset   Diabetes Mother    COPD Father    Stroke Paternal Grandfather     Review of Systems:  As stated in the HPI and otherwise negative.   There were no vitals taken for this visit.  Physical Examination: General: Well developed, well nourished, NAD  HEENT: OP clear, mucus membranes moist  SKIN: warm, dry. No rashes. Neuro: No focal deficits  Musculoskeletal: Muscle strength 5/5 all ext  Psychiatric: Mood and affect normal  Neck: No JVD, no carotid bruits, no thyromegaly, no lymphadenopathy.  Lungs:Clear bilaterally, no wheezes, rhonci, crackles Cardiovascular: Regular rate and rhythm. *** Harsh systolic murmur.  Abdomen:Soft. Bowel sounds present. Non-tender.  Extremities: No lower extremity edema. Pulses are 2 + in the bilateral DP/PT.  EKG:  EKG is not *** ordered today.  Echo July 25, 2024: ***  Recent Labs: 04/05/2024: B Natriuretic Peptide 252.8 07/08/2024: ALT 22; BUN 20; Creatinine, Ser 1.10; Hemoglobin 13.3; Magnesium  2.0; Platelets 151; Potassium 4.2; Sodium 139    Wt Readings from Last 3 Encounters:  05/30/24 247 lb 14.4 oz (112.4 kg)  05/23/24 241 lb (109.3 kg)  04/28/24 243 lb  (110.2 kg)     Assessment and Plan:   1. Severe Aortic Valve Stenosis:  *** has severe, stage D aortic valve stenosis. *** has NYHA class *** symptoms. I have personally reviewed the echo images. The aortic valve is thickened and calcified with limited leaflet mobility. I think *** would benefit from AVR. Given advanced age, *** is not a good candidate for conventional AVR by surgical approach. I think *** may be a good candidate for TAVR.   I have reviewed the natural history of aortic stenosis with the patient and their family members  who are present today. We have discussed the limitations of medical therapy and the poor prognosis associated with symptomatic aortic stenosis. We have reviewed potential treatment options, including palliative medical therapy, conventional surgical aortic valve replacement, and transcatheter aortic valve replacement. We discussed treatment options in the context of the patient's specific comorbid medical conditions.    *** would like to proceed with planning for TAVR. I will arrange a right and left heart catheterization at [facility] ***. Risks and benefits of the cath procedure and the valve procedure are reviewed with the patient. After the cath, *** will have a cardiac CT, CTA of the chest/abdomen and pelvis and will then be referred to see one of the CT surgeons on our TAVR team.     Labs/ tests ordered today include:   No orders of the defined types were placed in this encounter.  Disposition:   F/U will be arranged with me after his echo in 3-4 months  Signed, [name], MD, [facility] 07/25/2024 10:54 AM    [facility] [address] [place], [place], [place]  [address] Phone: [phone]; Fax: [phone]

## 2024-07-26 ENCOUNTER — Encounter: Payer: Self-pay | Admitting: *Deleted

## 2024-07-26 ENCOUNTER — Other Ambulatory Visit: Payer: Self-pay | Admitting: *Deleted

## 2024-07-26 ENCOUNTER — Ambulatory Visit: Payer: Self-pay | Admitting: Cardiovascular Disease

## 2024-07-26 ENCOUNTER — Ambulatory Visit: Attending: Cardiovascular Disease | Admitting: Cardiovascular Disease

## 2024-07-26 ENCOUNTER — Encounter: Payer: Self-pay | Admitting: Cardiovascular Disease

## 2024-07-26 VITALS — BP 123/72 | HR 75 | Ht 68.0 in | Wt 234.5 lb

## 2024-07-26 DIAGNOSIS — I350 Nonrheumatic aortic (valve) stenosis: Secondary | ICD-10-CM

## 2024-07-26 LAB — ECHOCARDIOGRAM COMPLETE
AR max vel: 0.92 cm2
AV Area VTI: 0.84 cm2
AV Area mean vel: 0.88 cm2
AV Mean grad: 34.2 mmHg
AV Peak grad: 55.3 mmHg
Ao pk vel: 3.72 m/s
Area-P 1/2: 3.11 cm2
S' Lateral: 4.5 cm

## 2024-07-26 NOTE — Patient Instructions (Addendum)
 Medication Instructions:  Your physician recommends that you continue on your current medications as directed. Please refer to the Current Medication list given to you today.  *If you need a refill on your cardiac medications before your next appointment, please call your pharmacy*  Lab Work: Have lab work done in the lab on the first floor on 08/23/24 when here for EKG.  BMP and CBC If you have labs (blood work) drawn today and your tests are completely normal, you will receive your results only by: MyChart Message (if you have MyChart) OR A paper copy in the mail If you have any lab test that is abnormal or we need to change your treatment, we will call you to review the results.  Testing/Procedures: Your physician has requested that you have a cardiac catheterization. Cardiac catheterization is used to diagnose and/or treat various heart conditions. Doctors may recommend this procedure for a number of different reasons. The most common reason is to evaluate chest pain. Chest pain can be a symptom of coronary artery disease (CAD), and cardiac catheterization can show whether plaque is narrowing or blocking your heart's arteries. This procedure is also used to evaluate the valves, as well as measure the blood flow and oxygen levels in different parts of your heart. For further information please visit [email or web address] Please follow instruction sheet, as given. Scheduled for 08/31/24  Follow-Up: At [facility], you and your health needs are our priority.  As part of our continuing mission to provide you with exceptional heart care, our providers are all part of one team.  This team includes your primary Cardiologist (physician) and Advanced Practice Providers or APPs (Physician Assistants and Nurse Practitioners) who all work together to provide you with the care you need, when you need it.  Your next appointment:   To be arranged after procedure  Provider:   [name],  MD    We recommend signing up for the patient portal called MyChart.  Sign up information is provided on this After Visit Summary.  MyChart is used to connect with patients for Virtual Visits (Telemedicine).  Patients are able to view lab/test results, encounter notes, upcoming appointments, etc.  Non-urgent messages can be sent to your provider as well.   To learn more about what you can do with MyChart, go to [email or web address].   Other Instructions   Nurse room visit on 08/23/24 at 11 AM for EKG  [place] [facility] A DEPT OF [place].  [facility] AT [address] A DEPT OF THE [place]. [facility] [address] [place] [address] Dept: [phone] Loc: [phone]  Eric Stokes  07/26/2024  You are scheduled for a Cardiac Catheterization on Wednesday, January 14 with Dr. [name].  1. Please arrive at the [facility] (Main Entrance A) at [facility]: [address] [place], [place] [address] at 6:30 AM (This time is 2 hour(s) before your procedure to ensure your preparation).   Free valet parking service is available. You will check in at ADMITTING. The support person will be asked to wait in the waiting room.  It is OK to have someone drop you off and come back when you are ready to be discharged.    Special note: Every effort is made to have your procedure done on time. Please understand that emergencies sometimes delay scheduled procedures.  2. Diet: Nothing to eat after midnight.   3. Hydration: You need to be well hydrated before your  procedure. On January 14, you may drink approved liquids (see below) until 2 hours before the procedure, with 16 oz of water  as your last intake.   List of approved liquids water , clear juice, clear tea, black coffee, fruit juices, non-citric and without pulp, carbonated beverages, Gatorade, Kool -Aid, plain Jello-O and plain ice popsicles.  4. Labs: You will need to have blood drawn on  Tuesday, January 6 at [facility] (1st Floor), [address] [place], [place] [address]. You do not need to be fasting.  5. Medication instructions in preparation for your procedure Stop Eliquis  2 days prior to procedure.  Last dose will be evening dose on Sunday 08/28/24 Do not take furosemide  the day of the procedure   Contrast Allergy: No   On the morning of your procedure, take your Aspirin  81 mg and any morning medicines NOT listed above.  You may use sips of water .  6. Plan to go home the same day, you will only stay overnight if medically necessary. 7. Bring a current list of your medications and current insurance cards. 8. You MUST have a responsible person to drive you home. 9. Someone MUST be with you the first 24 hours after you arrive home or your discharge will be delayed. 10. Please wear clothes that are easy to get on and off and wear slip-on shoes.  Thank you for allowing us  to care for you!   -- [facility]

## 2024-07-26 NOTE — Progress Notes (Signed)
 Pre Surgical Assessment: 5 M Walk Test  16M=16.46ft  5 Meter Walk Test- trial 1: 5.51 seconds 5 Meter Walk Test- trial 2: 5.09 seconds 5 Meter Walk Test- trial 3: 5.26 seconds 5 Meter Walk Test Average: 5.29 seconds

## 2024-08-03 ENCOUNTER — Ambulatory Visit: Admitting: Psychology

## 2024-08-03 ENCOUNTER — Telehealth: Payer: Self-pay | Admitting: Cardiovascular Disease

## 2024-08-03 NOTE — Telephone Encounter (Signed)
 Daughter Kayleen) is following-up on the status of patient's [facility] approval for patient to have heart valve surgery.

## 2024-08-23 ENCOUNTER — Encounter: Payer: Self-pay | Admitting: Cardiovascular Disease

## 2024-08-23 ENCOUNTER — Ambulatory Visit

## 2024-08-23 DIAGNOSIS — I350 Nonrheumatic aortic (valve) stenosis: Secondary | ICD-10-CM

## 2024-08-23 NOTE — Progress Notes (Signed)
" ° °  Nurse Visit   Date of Encounter: 08/23/2024 ID: Eric Stokes, DOB [birth year], MRN [id number]  PCP:  Kenney Clotilda PARAS, PA   [facility] Providers Cardiologist:  None Cardiology APP:  Marcine Caffie HERO, PA-C      Visit Details   VS:  There were no vitals taken for this visit. , BMI There is no height or weight on file to calculate BMI.  Wt Readings from Last 3 Encounters:  07/26/24 234 lb 8 oz (106.4 kg)  05/30/24 247 lb 14.4 oz (112.4 kg)  05/23/24 241 lb (109.3 kg)     Reason for visit: EKG Performed today: EKG, Provider consulted:Dr. Ladona, and Education Changes (medications, testing, etc.) : None. Pt sent downstairs for labs. Length of Visit: 20 minutes    Medications Adjustments/Labs and Tests Ordered: Orders Placed This Encounter  Procedures   EKG 12-Lead   No orders of the defined types were placed in this encounter.  EKG 08/23/2024: Normal sinus rhythm at rate of 83 bpm, LAFB.  Frequent PACs (3) and single (PVC).  Compared to 07/08/2024, PACs more frequent  Signed, Gordy Ladona, MD  08/23/2024 12:31 PM  "

## 2024-08-23 NOTE — Addendum Note (Signed)
 Addended by: JERONE THERSIA HERO on: 08/23/2024 03:38 PM   Modules accepted: Level of Service

## 2024-08-24 ENCOUNTER — Ambulatory Visit: Payer: Self-pay | Admitting: Cardiovascular Disease

## 2024-08-24 LAB — CBC
Hematocrit: 39.7 % (ref 37.5–51.0)
Hemoglobin: 13.2 g/dL (ref 13.0–17.7)
MCH: 31.7 pg (ref 26.6–33.0)
MCHC: 33.2 g/dL (ref 31.5–35.7)
MCV: 95 fL (ref 79–97)
Platelets: 151 x10E3/uL (ref 150–450)
RBC: 4.17 x10E6/uL (ref 4.14–5.80)
RDW: 13.3 % (ref 11.6–15.4)
WBC: 4.7 x10E3/uL (ref 3.4–10.8)

## 2024-08-24 LAB — BASIC METABOLIC PANEL WITH GFR
BUN/Creatinine Ratio: 17 (ref 10–24)
BUN: 16 mg/dL (ref 8–27)
CO2: 21 mmol/L (ref 20–29)
Calcium: 9.1 mg/dL (ref 8.6–10.2)
Chloride: 105 mmol/L (ref 96–106)
Creatinine, Ser: 0.94 mg/dL (ref 0.76–1.27)
Glucose: 90 mg/dL (ref 70–99)
Potassium: 4.3 mmol/L (ref 3.5–5.2)
Sodium: 140 mmol/L (ref 134–144)
eGFR: 82 mL/min/1.73

## 2024-08-26 ENCOUNTER — Encounter: Payer: Self-pay | Admitting: Cardiovascular Disease

## 2024-08-29 ENCOUNTER — Telehealth: Payer: Self-pay | Admitting: *Deleted

## 2024-08-29 NOTE — Telephone Encounter (Addendum)
 Cardiac Catheterization scheduled at [facility] for: Wednesday August 31, 2024 8:30 AM Arrival time [facility] Main Entrance A at: 6:30 AM  Diet: -Nothing to eat after midnight.  Hydration: -May drink clear liquids until 2 hours before the procedure.  Approved liquids: Water , clear tea, black coffee, fruit juices-non-citric and without pulp,Gatorade, plain Jello/popsicles.   -Please drink 16 oz of water  2 hours before procedure.  Medication instructions: -Hold:   Eliquis -none 08/29/24 until post procedure  Lasix -AM of procedure -Other usual morning medications can be taken including aspirin  81 mg.  Plan to go home the same day, you will only stay overnight if medically necessary.  You must have responsible adult to drive you home.  Someone must be with you the first 24 hours after you arrive home.  Reviewed procedure instructions with patient's daughter (DPR), Donny Gull.

## 2024-08-31 ENCOUNTER — Other Ambulatory Visit: Payer: Self-pay

## 2024-08-31 ENCOUNTER — Encounter (HOSPITAL_COMMUNITY): Admission: RE | Disposition: A | Payer: Self-pay | Source: Home / Self Care | Attending: Cardiovascular Disease

## 2024-08-31 ENCOUNTER — Encounter (HOSPITAL_COMMUNITY): Payer: Self-pay | Admitting: Cardiovascular Disease

## 2024-08-31 ENCOUNTER — Ambulatory Visit (HOSPITAL_COMMUNITY)
Admission: RE | Admit: 2024-08-31 | Discharge: 2024-08-31 | Disposition: A | Discharge status: Home / Self Care | Attending: Cardiovascular Disease | Admitting: Cardiovascular Disease

## 2024-08-31 DIAGNOSIS — E785 Hyperlipidemia, unspecified: Secondary | ICD-10-CM | POA: Diagnosis not present

## 2024-08-31 DIAGNOSIS — I5022 Chronic systolic (congestive) heart failure: Secondary | ICD-10-CM | POA: Diagnosis not present

## 2024-08-31 DIAGNOSIS — I252 Old myocardial infarction: Secondary | ICD-10-CM | POA: Diagnosis not present

## 2024-08-31 DIAGNOSIS — I480 Paroxysmal atrial fibrillation: Secondary | ICD-10-CM | POA: Insufficient documentation

## 2024-08-31 DIAGNOSIS — I110 Hypertensive heart disease with heart failure: Secondary | ICD-10-CM | POA: Insufficient documentation

## 2024-08-31 DIAGNOSIS — Z87891 Personal history of nicotine dependence: Secondary | ICD-10-CM | POA: Insufficient documentation

## 2024-08-31 DIAGNOSIS — I350 Nonrheumatic aortic (valve) stenosis: Secondary | ICD-10-CM | POA: Diagnosis not present

## 2024-08-31 DIAGNOSIS — I2510 Atherosclerotic heart disease of native coronary artery without angina pectoris: Secondary | ICD-10-CM

## 2024-08-31 DIAGNOSIS — J449 Chronic obstructive pulmonary disease, unspecified: Secondary | ICD-10-CM | POA: Diagnosis not present

## 2024-08-31 DIAGNOSIS — Z6834 Body mass index (BMI) 34.0-34.9, adult: Secondary | ICD-10-CM | POA: Diagnosis not present

## 2024-08-31 DIAGNOSIS — Z7901 Long term (current) use of anticoagulants: Secondary | ICD-10-CM | POA: Insufficient documentation

## 2024-08-31 DIAGNOSIS — Z79899 Other long term (current) drug therapy: Secondary | ICD-10-CM | POA: Diagnosis not present

## 2024-08-31 DIAGNOSIS — Z951 Presence of aortocoronary bypass graft: Secondary | ICD-10-CM | POA: Diagnosis not present

## 2024-08-31 DIAGNOSIS — Z8546 Personal history of malignant neoplasm of prostate: Secondary | ICD-10-CM | POA: Insufficient documentation

## 2024-08-31 DIAGNOSIS — I083 Combined rheumatic disorders of mitral, aortic and tricuspid valves: Secondary | ICD-10-CM | POA: Diagnosis not present

## 2024-08-31 DIAGNOSIS — I2584 Coronary atherosclerosis due to calcified coronary lesion: Secondary | ICD-10-CM | POA: Insufficient documentation

## 2024-08-31 HISTORY — PX: RIGHT HEART CATH AND CORONARY/GRAFT ANGIOGRAPHY: CATH118265

## 2024-08-31 LAB — POCT I-STAT EG7
Acid-base deficit: 3 mmol/L — ABNORMAL HIGH (ref 0.0–2.0)
Bicarbonate: 22.9 mmol/L (ref 20.0–28.0)
Calcium, Ion: 1.2 mmol/L (ref 1.15–1.40)
HCT: 37 % — ABNORMAL LOW (ref 39.0–52.0)
Hemoglobin: 12.6 g/dL — ABNORMAL LOW (ref 13.0–17.0)
O2 Saturation: 69 %
Potassium: 3.9 mmol/L (ref 3.5–5.1)
Sodium: 143 mmol/L (ref 135–145)
TCO2: 24 mmol/L (ref 22–32)
pCO2, Ven: 42.1 mmHg — ABNORMAL LOW (ref 44–60)
pH, Ven: 7.343 (ref 7.25–7.43)
pO2, Ven: 38 mmHg (ref 32–45)

## 2024-08-31 MED ORDER — FENTANYL CITRATE (PF) 100 MCG/2ML IJ SOLN
INTRAMUSCULAR | Status: AC
  Filled 2024-08-31: qty 2

## 2024-08-31 MED ORDER — IOHEXOL 350 MG/ML SOLN
INTRAVENOUS | Status: DC | PRN
  Administered 2024-08-31: 48 mL

## 2024-08-31 MED ORDER — HEPARIN (PORCINE) IN NACL 1000-0.9 UT/500ML-% IV SOLN
INTRAVENOUS | Status: DC | PRN
  Administered 2024-08-31 (×2): 500 mL

## 2024-08-31 MED ORDER — MIDAZOLAM HCL (PF) 2 MG/2ML IJ SOLN
INTRAMUSCULAR | Status: DC | PRN
  Administered 2024-08-31 (×2): 1 mg via INTRAVENOUS

## 2024-08-31 MED ORDER — LIDOCAINE HCL (PF) 1 % IJ SOLN
INTRAMUSCULAR | Status: DC | PRN
  Administered 2024-08-31: 5 mL

## 2024-08-31 MED ORDER — SODIUM CHLORIDE 0.9% FLUSH: 3.0000 mL | INTRAVENOUS | Status: DC | PRN

## 2024-08-31 MED ORDER — SODIUM CHLORIDE 0.9% FLUSH: 3.0000 mL | Freq: Two times a day (BID) | INTRAVENOUS | Status: DC

## 2024-08-31 MED ORDER — FENTANYL CITRATE (PF) 100 MCG/2ML IJ SOLN
INTRAMUSCULAR | Status: DC | PRN
  Administered 2024-08-31 (×2): 25 ug via INTRAVENOUS

## 2024-08-31 MED ORDER — ACETAMINOPHEN 325 MG PO TABS: 650.0000 mg | ORAL_TABLET | ORAL | Status: DC | PRN

## 2024-08-31 MED ORDER — LIDOCAINE HCL (PF) 1 % IJ SOLN
INTRAMUSCULAR | Status: AC
  Filled 2024-08-31: qty 30

## 2024-08-31 MED ORDER — ASPIRIN 81 MG PO CHEW: 81.0000 mg | CHEWABLE_TABLET | ORAL | Status: DC

## 2024-08-31 MED ORDER — SODIUM CHLORIDE 0.9 % IV SOLN: 250.0000 mL | INTRAVENOUS | Status: DC | PRN

## 2024-08-31 MED ORDER — HYDRALAZINE HCL 20 MG/ML IJ SOLN: 10.0000 mg | INTRAMUSCULAR | Status: DC | PRN

## 2024-08-31 MED ORDER — ONDANSETRON HCL 4 MG/2ML IJ SOLN: 4.0000 mg | Freq: Four times a day (QID) | INTRAMUSCULAR | Status: DC | PRN

## 2024-08-31 MED ORDER — MIDAZOLAM HCL 2 MG/2ML IJ SOLN
INTRAMUSCULAR | Status: AC
  Filled 2024-08-31: qty 2

## 2024-08-31 MED ORDER — LABETALOL HCL 5 MG/ML IV SOLN: 10.0000 mg | INTRAVENOUS | Status: DC | PRN

## 2024-08-31 MED ORDER — FREE WATER: 500.0000 mL | Freq: Once | Status: DC

## 2024-08-31 NOTE — Discharge Instructions (Signed)
-  Resume Eliquis tomorrow

## 2024-08-31 NOTE — Interval H&P Note (Signed)
 History and Physical Interval Note:  08/31/2024 8:10 AM  Eric Stokes  has presented today for surgery, with the diagnosis of aortic stenosis.  The various methods of treatment have been discussed with the patient and family. After consideration of risks, benefits and other options for treatment, the patient has consented to  Procedures: RIGHT/LEFT HEART CATH AND CORONARY/GRAFT ANGIOGRAPHY (N/A) as a surgical intervention.  The patient's history has been reviewed, patient examined, no change in status, stable for surgery.  I have reviewed the patient's chart and labs.  Questions were answered to the patient's satisfaction.    Cath Lab Visit (complete for each Cath Lab visit)  Clinical Evaluation Leading to the Procedure:   ACS: No.  Non-ACS:    Anginal Classification: CCS I  Anti-ischemic medical therapy: Minimal Therapy (1 class of medications)  Non-Invasive Test Results: No non-invasive testing performed  Prior CABG: Previous CABG        Eric Stokes

## 2024-08-31 NOTE — H&P (Signed)
 History of Present Illness: 79 yo male with history of CAD s/p 4V CABG in 2021, LAA clipping, MAZE, chronic systolic CHF, PAF, obesity, COPD, HLD, prostate cancer and severe aortic stenosis who is here today for follow up. I saw him as a new consult in September 2025 to discuss his aortic stenosis and possible TAVR. He described feeling well overall. He was admitted in 2021 with atrial fibrillation with RVR. Echo with LVEF=25-30%. Cardiac cath with severe left main and three vessel CAD. He underwent CABG with MAZE and LAA clipping in October 2021. LVEF improved to 40-45% post CABG. He has been followed in our Advanced Heart Failure Clinic by Dr. Cherrie. Echo at the [facility] 03/01/24 with LVEF=50-55% with moderate to severe aortic valve stenosis with peak aortic velocity 364 cm/s, mean gradient 32 mmHg, peak gradient 53 mm Hg, AVA 1.1 cm2. Moderate MR. Cardiac monitor September 2025 with sinus, 61 runs of VT. 3495 runs of SVT.  Frequent PVCs (5.5% burden). He was seen in the AHF clinic in October 2025 and reported feeling well. He was seen in the ED 07/08/24 with stroke like symptoms and had an unremarkable head CTA and MRI. He was felt to have a TIA. Echo 07/25/24 with LVEF=40-45% with global hypokinesis. Mild to moderate MR. Severe aortic stenosis with mean gradient 34 mmHg, AVA 0.84 cm2, DI 0.16, SVI 33.    He continues to have progressive fatigue and dyspnea. He denies any chest pain, palpitations, lower extremity edema, orthopnea, PND, dizziness, near syncope or syncope.   He lives in [place]. He is retired from holiday representative. He has no active dental issues. He is here today with his daughter. Of note, left radial artery used as a bypass graft.      Primary Care Physician: Kenney Clotilda PARAS, PA Primary Cardiologist: Bensimhon Referring Cardiologist: Bensimhon       Past Medical History:  Diagnosis Date   Aortic stenosis, mild      last echo in epic 05-27-2020  ef 40-45%,  mean grandiant ,  valve area 2.46 cm^2 ,  mild regurg   BPH with obstruction/lower urinary tract symptoms      urologist--- dr nieves   Carotid bruit present 06/15/2018    2015 Carotid Dopplers:  Essentially normal carotid arteries, with very slight hard plaque in the proximal ICA's and serpentine distal vessels. 1-39% bilateral ICA stenosis. Patent vertebral arteries with antegrade flow. Normal subclavian arteries, bilaterally.   Chronic systolic CHF (congestive heart failure) (HCC) 05/2020    followed by dr jaclynn   COPD (chronic obstructive pulmonary disease) (HCC)     Coronary artery disease 04/2020    cardiologist--- dr cherrie;   NSTEMI /  Afib w/ RVR/  Acute CHF/  cardiogenic shock 04-20-2020;  cath 05-17-2020 multivessel disease w/ critical LM;   05-18-2020 s/p CABG x4 and Maze procedure;  nuclear stress study done [facility] 09-17-2021 no ischemia, ef 54%   Essential hypertension, benign 12/21/2008    Qualifier: Diagnosis of  By: Lavona, MD, CODY Agent     Glaucoma, both eyes     History of migraine     History of non-ST elevation myocardial infarction (NSTEMI) 05/17/2020    cardiogenic shock,  AF w/ RVR, acute CHF,  ICM;   s/p CABG w/ MAZE   History of squamous cell carcinoma of skin     History of TIA (transient ischemic attack) 02/28/2019    questionable TIA  per neurology note dr rosemarie, 04-12-2019, likely migraine   Hx of colonic  polyps 01/09/2020   Hyperlipidemia     Ischemic cardiomyopathy 04/2020    per cath 09-03-20212  ef 25-30%;   last echo in epic 05-27-2020  ef 40-45%   Malignant neoplasm prostate [facility]) 10/2019    urologist--- dr nieves;  dx 03/ 2021  active survillance   Myocardial infarction (HCC)     OA (osteoarthritis)     OSA on CPAP      Diagnosed at the [facility] 2016    PAF (paroxysmal atrial fibrillation) (HCC) 04/2020    followed by cardiology---  s/p MAZE procedure 05-18-2020, takes ASA   PVC's (premature ventricular contractions)     S/P CABG x 4 05/18/2020     LIMA to LAD,  RIMA to PDA, SVG to OM1 and OM2   Seasonal allergic rhinitis     Sinus bradycardia      event monitor 01-29-2017  mild bradycardia, no pauses, rare PVCs   Vitamin B 12 deficiency     Vitamin D  deficiency 11/26/2010   Wears hearing aid in both ears                 Past Surgical History:  Procedure Laterality Date   CATARACT EXTRACTION W/ INTRAOCULAR LENS IMPLANT Bilateral     CLIPPING OF ATRIAL APPENDAGE Left 05/18/2020    Procedure: CLIPPING OF ATRIAL APPENDAGE USING ATRICURE 45 MM ATRICLIP;  Surgeon: German Bartlett PEDLAR, MD;  Location: [facility] OR;  Service: Open Heart Surgery;  Laterality: Left;   COLONOSCOPY        per pt last one approx 2019  by dr kristie   CORONARY ARTERY BYPASS GRAFT N/A 05/18/2020    Procedure: CORONARY ARTERY BYPASS GRAFTING (CABG) TIMES FOUR USING INTERNAL BILATERAL MAMMARY ARTERIES AND HARVESTED LEFT RADIAL ARTERY.;  Surgeon: German Bartlett PEDLAR, MD;  Location: [facility] OR;  Service: Open Heart Surgery;  Laterality: N/A;  CORONARY ENDARTERECTOMY PERFORMED DURING SURGERY    CYSTOSCOPY WITH URETHRAL DILATATION N/A 10/28/2022    Procedure: CYSTOSCOPY WITH BLADDER NECK DILATATION;  Surgeon: Nieves Cough, MD;  Location: [facility];  Service: Urology;  Laterality: N/A;  75 MINS   GOLD SEED IMPLANT N/A 08/26/2022    Procedure: GOLD SEED IMPLANT;  Surgeon: Devere Lonni Righter, MD;  Location: [facility];  Service: Urology;  Laterality: N/A;   IABP INSERTION N/A 05/17/2020    Procedure: IABP INSERTION;  Surgeon: Cherrie Toribio SAUNDERS, MD;  Location: [facility];  Service: Cardiovascular;  Laterality: N/A;   INSERTION OF SUPRAPUBIC CATHETER N/A 10/10/2022    Procedure: INSERTION OF SUPRAPUBIC CATHETER cystoscopy urethral dilation;  Surgeon: Lovie Arlyss CROME, MD;  Location: [facility];  Service: Urology;  Laterality: N/A;   KNEE ARTHROPLASTY Right 03/14/2020    Procedure: COMPUTER ASSISTED TOTAL KNEE ARTHROPLASTY;  Surgeon: Fidel Rogue, MD;   Location: [facility];  Service: Orthopedics;  Laterality: Right;   LAPAROSCOPIC CHOLECYSTECTOMY   02/26/2000    @[facility]   by dr merrilyn   MAZE N/A 05/18/2020    Procedure: MAZE USING ATRICURE ISOLATOR CLAMP OLL2;  Surgeon: German Bartlett PEDLAR, MD;  Location: [facility];  Service: Open Heart Surgery;  Laterality: N/A;   RADIAL ARTERY HARVEST Left 05/18/2020    Procedure: RADIAL ARTERY HARVEST;  Surgeon: German Bartlett PEDLAR, MD;  Location: [facility];  Service: Open Heart Surgery;  Laterality: Left;   RIGHT HEART CATH N/A 05/17/2020    Procedure: RIGHT HEART CATH;  Surgeon: Cherrie Toribio SAUNDERS, MD;  Location: [facility];  Service:  Cardiovascular;  Laterality: N/A;   RIGHT/LEFT HEART CATH AND CORONARY ANGIOGRAPHY N/A 05/17/2020    Procedure: RIGHT/LEFT HEART CATH AND CORONARY ANGIOGRAPHY;  Surgeon: Court Dorn PARAS, MD;  Location: [facility];  Service: Cardiovascular;  Laterality: N/A;   SPACE OAR INSTILLATION N/A 08/26/2022    Procedure: SPACE OAR INSTILLATION;  Surgeon: Devere Lonni Righter, MD;  Location: [facility];  Service: Urology;  Laterality: N/A;  30 MINS FOR CASE   TEE WITHOUT CARDIOVERSION N/A 05/18/2020    Procedure: TRANSESOPHAGEAL ECHOCARDIOGRAM (TEE);  Surgeon: German Bartlett PEDLAR, MD;  Location: [facility] OR;  Service: Open Heart Surgery;  Laterality: N/A;   THULIUM LASER TURP (TRANSURETHRAL RESECTION OF PROSTATE) N/A 04/15/2022    Procedure: JULIE LASER VAPORIZATION OF PROSTATE;  Surgeon: Nieves Cough, MD;  Location: [facility];  Service: Urology;  Laterality: N/A;  90 MINS FOR CASE   TONSILLECTOMY AND ADENOIDECTOMY   1952   TRANSURETHRAL RESECTION OF PROSTATE N/A 04/15/2022   TRANSURETHRAL RESECTION OF PROSTATE   10/28/2022    Procedure: TRANSURETHRAL RESECTION OF THE PROSTATE (TURP);  Surgeon: Nieves Cough, MD;  Location: [facility];  Service: Urology;;   TRANSURETHRAL RESECTION OF PROSTATE N/A 05/19/2023    Procedure: TRANSURETHRAL RESECTION OF THE  PROSTATE (TURP);  Surgeon: Nieves Cough, MD;  Location: [facility];  Service: Urology;  Laterality: N/A;   TRANSURETHRAL RESECTION OF PROSTATE N/A 02/23/2024    Procedure: TURP (TRANSURETHRAL RESECTION OF PROSTATE);  Surgeon: Nieves Cough, MD;  Location: [facility];  Service: Urology;  Laterality: N/A;                Current Outpatient Medications  Medication Sig Dispense Refill   acetaminophen  (TYLENOL ) 500 MG tablet Take 500-1,000 mg by mouth every 6 (six) hours as needed (pain.).       albuterol  (VENTOLIN  HFA) 108 (90 Base) MCG/ACT inhaler Inhale 1 puff into the lungs as needed for wheezing or shortness of breath.       alfuzosin  (UROXATRAL ) 10 MG 24 hr tablet Take 10 mg by mouth at bedtime.       apixaban  (ELIQUIS ) 5 MG TABS tablet Take 1 tablet (5 mg total) by mouth 2 (two) times daily.       cetirizine (ZYRTEC) 10 MG tablet Take 10 mg by mouth at bedtime.       Cholecalciferol  (VITAMIN D ) 50 MCG (2000 UT) tablet Take 2,000 Units by mouth daily.       fluticasone  (FLONASE ) 50 MCG/ACT nasal spray USE TWO SPRAYS IN EACH NOSTRIL DAILY AS NEEDED. 48 mL 0   furosemide  (LASIX ) 20 MG tablet Take 1 tablet (20 mg total) by mouth daily as needed. 30 tablet 3   ketotifen (ZADITOR) 0.025 % ophthalmic solution Place 1 drop into both eyes daily as needed (allergies).       latanoprost  (XALATAN ) 0.005 % ophthalmic solution INSTILL 1 DROP INTO BOTH EYES AT BEDTIME 11.25 mL 1   metoprolol  succinate (TOPROL -XL) 50 MG 24 hr tablet Take 1 tablet (50 mg total) by mouth daily. Take with or immediately following a meal. (Patient taking differently: Take 25 mg by mouth daily. Take with or immediately following a meal.) 90 tablet 3   montelukast  (SINGULAIR ) 10 MG tablet TAKE 1 TABLET BY MOUTH EVERY DAY AT BEDTIME 90 tablet 3   nitroGLYCERIN  (NITROSTAT ) 0.4 MG SL tablet Place 1 tablet (0.4 mg total) under the tongue every 5 (five) minutes as needed for chest pain. 90 tablet 3   NON  FORMULARY Cpap at night        rosuvastatin  (CRESTOR ) 20 MG tablet Take 1 tablet (20 mg total) by mouth daily. 90 tablet 3      No current facility-administered medications for this visit.        Allergies       Allergies  Allergen Reactions   Flomax  [Tamsulosin ] Other (See Comments)      dizziness   Cipro [Ciprofloxacin Hcl] Swelling      Knee swelling, joint pain   Lipitor [Atorvastatin] Other (See Comments)      Leg Cramps   Lisinopril Cough   Farxiga [Dapagliflozin] Other (See Comments)      Had UTI on Farxiga and Jardiance    Spironolactone  Other (See Comments)      Grew breasts         Social History         Socioeconomic History   Marital status: Married      Spouse name: Not on file   Number of children: 2   Years of education: 13   Highest education level: Not on file  Occupational History   Occupation: retired   Occupation: Retired holiday representative  Tobacco Use   Smoking status: Former      Current packs/day: 0.00      Types: Cigarettes      Start date: 1964      Quit date: 1967      Years since quitting: 58.9      Passive exposure: Past   Smokeless tobacco: Never  Vaping Use   Vaping status: Never Used  Substance and Sexual Activity   Alcohol  use: Yes      Comment: Occasional beer   Drug use: Never   Sexual activity: Yes  Other Topics Concern   Not on file  Social History Narrative    Is a [place] veteran, has trouble sleeping most nights. Often experiences nightmares w/anesthesia meds.    Social Drivers of Acupuncturist Strain: Low Risk  (03/05/2020)    Overall Financial Resource Strain (CARDIA)     Difficulty of Paying Living Expenses: Not hard at all  Food Insecurity: No Food Insecurity (09/13/2021)    Received [facility]    Hunger Vital Sign     Within the past 12 months, you worried that your food would run out before you got the money to buy more.: Never true     Within the past 12 months, the food you bought just didn't last and you didn't  have money to get more.: Never true  Transportation Needs: No Transportation Needs (03/05/2020)    PRAPARE - Therapist, Art (Medical): No     Lack of Transportation (Non-Medical): No  Physical Activity: Unknown (03/05/2020)    Exercise Vital Sign     Days of Exercise per Week: 7 days     Minutes of Exercise per Session: Not on file  Stress: No Stress Concern Present (03/05/2020)    [employer] of Occupational Health - Occupational Stress Questionnaire     Feeling of Stress : Not at all  Social Connections: Moderately Isolated (03/05/2020)    Social Connection and Isolation Panel     Frequency of Communication with Friends and Family: More than three times a week     Frequency of Social Gatherings with Friends and Family: More than three times a week     Attends Religious Services: Never  Active Member of Clubs or Organizations: No     Attends Banker Meetings: Never     Marital Status: Married  Catering Manager Violence: Not At Risk (03/05/2020)    Humiliation, Afraid, Rape, and Kick questionnaire     Fear of Current or Ex-Partner: No     Emotionally Abused: No     Physically Abused: No     Sexually Abused: No           Family History  Problem Relation Age of Onset   Diabetes Mother     COPD Father     Stroke Paternal Grandfather            Review of Systems:  As stated in the HPI and otherwise negative.       08/31/2024    6:47 AM 07/26/2024    9:00 AM 07/08/2024    8:00 PM  Vitals with BMI  Height 5' 8.5 5' 8   Weight 232 lbs 234 lbs 8 oz   BMI 34.76 35.66   Systolic 132 123 858  Diastolic 73 72 85  Pulse 79 75 67      Physical Examination: General: Well developed, well nourished, NAD  SKIN: warm, dry. Neuro: No focal deficits  Psychiatric: Mood and affect normal  Neck: No JVD Lungs:Clear bilaterally, no wheezes, rhonci, crackles Cardiovascular: Regular rate and rhythm. Systolic  murmur. No murmurs, gallops or  rubs. Abdomen:Soft.  Extremities: No lower extremity edema.     EKG:  EKG is not ordered today.   Echo July 25, 2024:  1. Left ventricular ejection fraction, by estimation, is 40 to 45%. The  left ventricle has mildly decreased function. The left ventricle  demonstrates global hypokinesis. The left ventricular internal cavity size  was mildly dilated. There is moderate  concentric left ventricular hypertrophy. Left ventricular diastolic  parameters are indeterminate. There is the interventricular septum is  flattened in systole and diastole, consistent with right ventricular  pressure and volume overload.   2. Right ventricular systolic function is moderately reduced. The right  ventricular size is mildly enlarged.   3. Left atrial size was severely dilated.   4. Right atrial size was moderately dilated.   5. The mitral valve is degenerative. Mild to moderate mitral valve  regurgitation. No evidence of mitral stenosis. Moderate mitral annular  calcification.   6. Tricuspid valve regurgitation is mild to moderate.   7. The aortic valve is tricuspid. There is severe calcifcation of the  aortic valve. Aortic valve regurgitation is trivial. Moderate to severe  aortic valve stenosis. Aortic valve area, by VTI measures 0.84 cm. Aortic  valve mean gradient measures 34.2  mmHg. Aortic valve Vmax measures 3.72 m/s.   8. Aortic dilatation noted. There is borderline dilatation of the  ascending aorta, measuring 39 mm.   9. The inferior vena cava is normal in size with greater than 50%  respiratory variability, suggesting right atrial pressure of 3 mmHg.   FINDINGS   Left Ventricle: Left ventricular ejection fraction, by estimation, is 40  to 45%. The left ventricle has mildly decreased function. The left  ventricle demonstrates global hypokinesis. The left ventricular internal  cavity size was mildly dilated. There is   moderate concentric left ventricular hypertrophy. The  interventricular  septum is flattened in systole and diastole, consistent with right  ventricular pressure and volume overload. Left ventricular diastolic  parameters are indeterminate.   Right Ventricle: The right ventricular size is mildly enlarged. No  increase  in right ventricular wall thickness. Right ventricular systolic  function is moderately reduced.   Left Atrium: Left atrial size was severely dilated.   Right Atrium: Right atrial size was moderately dilated.   Pericardium: There is no evidence of pericardial effusion.   Mitral Valve: The mitral valve is degenerative in appearance. Moderate  mitral annular calcification. Mild to moderate mitral valve regurgitation.  No evidence of mitral valve stenosis.   Tricuspid Valve: The tricuspid valve is normal in structure. Tricuspid  valve regurgitation is mild to moderate. No evidence of tricuspid  stenosis.   Aortic Valve: The aortic valve is tricuspid. There is severe calcifcation  of the aortic valve. Aortic valve regurgitation is trivial. Moderate to  severe aortic stenosis is present. Aortic valve mean gradient measures  34.2 mmHg. Aortic valve peak gradient   measures 55.3 mmHg. Aortic valve area, by VTI measures 0.84 cm.   Pulmonic Valve: The pulmonic valve was not well visualized. Pulmonic valve  regurgitation is mild. No evidence of pulmonic stenosis.   Aorta: Aortic dilatation noted. There is borderline dilatation of the  ascending aorta, measuring 39 mm.   Venous: The inferior vena cava is normal in size with greater than 50%  respiratory variability, suggesting right atrial pressure of 3 mmHg.   IAS/Shunts: There is right bowing of the interatrial septum, suggestive of  elevated left atrial pressure. No atrial level shunt detected by color  flow Doppler.     LEFT VENTRICLE  PLAX 2D  LVIDd:         6.10 cm  LVIDs:         4.50 cm  LV PW:         1.40 cm  LV IVS:        1.30 cm  LVOT diam:     2.60 cm   LV SV:         74  LV SV Index:   33  LVOT Area:     5.31 cm     RIGHT VENTRICLE            IVC  RVSP:           33.0 mmHg  IVC diam: 1.50 cm   LEFT ATRIUM              Index        RIGHT ATRIUM           Index  LA diam:        5.50 cm  2.46 cm/m   RA Pressure: 3.00 mmHg  LA Vol (A2C):   100.0 ml 44.66 ml/m  RA Area:     25.30 cm  LA Vol (A4C):   99.8 ml  44.57 ml/m  RA Volume:   83.30 ml  37.20 ml/m  LA Biplane Vol: 100.0 ml 44.66 ml/m   AORTIC VALVE  AV Area (Vmax):    0.92 cm  AV Area (Vmean):   0.88 cm  AV Area (VTI):     0.84 cm  AV Vmax:           371.80 cm/s  AV Vmean:          273.600 cm/s  AV VTI:            0.884 m  AV Peak Grad:      55.3 mmHg  AV Mean Grad:      34.2 mmHg  LVOT Vmax:         64.52 cm/s  LVOT Vmean:  45.320 cm/s  LVOT VTI:          0.140 m  LVOT/AV VTI ratio: 0.16    AORTA  Ao Root diam: 3.50 cm  Ao Asc diam:  3.90 cm   MITRAL VALVE               TRICUSPID VALVE  MV Area (PHT): 3.11 cm    TR Peak grad:   30.0 mmHg  MV Decel Time: 244 msec    TR Vmax:        274.00 cm/s  MV E velocity: 94.16 cm/s  Estimated RAP:  3.00 mmHg                             RVSP:           33.0 mmHg                               SHUNTS                             Systemic VTI:  0.14 m                             Systemic Diam: 2.60 cm    Recent Labs: 04/05/2024: B Natriuretic Peptide 252.8 07/08/2024: ALT 22; BUN 20; Creatinine, Ser 1.10; Hemoglobin 13.3; Magnesium  2.0; Platelets 151; Potassium 4.2; Sodium 139       Wt Readings from Last 3 Encounters:  07/26/24 234 lb 8 oz (106.4 kg)  05/30/24 247 lb 14.4 oz (112.4 kg)  05/23/24 241 lb (109.3 kg)      Assessment and Plan:    1. Severe Aortic Valve Stenosis:  He has severe aortic valve stenosis. NYHA class 2 symptoms. I have personally reviewed the echo images. The aortic valve is thickened and calcified with limited leaflet mobility. His LV function is slightly worse than on last echo. LVEF  is now around 40%.  I think he would benefit from AVR. Given advanced age, he is not a good candidate for conventional AVR by surgical approach. I think he may be a good candidate for TAVR.   Cardiac cath today.      Femoral access for cath (Pt had left radial artery resection for bypass surgery)  [name], MD, [facility] 08/31/2024 8:09 AM

## 2024-08-31 NOTE — Progress Notes (Signed)
 19fr venous sheath removed intact. No bleeding or hematoma noted. 4x4 dressing applied to site. Post activity and precautions explained. Will transport to short stay.Jaideep Pollack E

## 2024-09-01 LAB — POCT I-STAT EG7
Acid-base deficit: 3 mmol/L — ABNORMAL HIGH (ref 0.0–2.0)
Bicarbonate: 23.1 mmol/L (ref 20.0–28.0)
Calcium, Ion: 1.24 mmol/L (ref 1.15–1.40)
HCT: 38 % — ABNORMAL LOW (ref 39.0–52.0)
Hemoglobin: 12.9 g/dL — ABNORMAL LOW (ref 13.0–17.0)
O2 Saturation: 70 %
Potassium: 3.9 mmol/L (ref 3.5–5.1)
Sodium: 142 mmol/L (ref 135–145)
TCO2: 24 mmol/L (ref 22–32)
pCO2, Ven: 42.3 mmHg — ABNORMAL LOW (ref 44–60)
pH, Ven: 7.346 (ref 7.25–7.43)
pO2, Ven: 39 mmHg (ref 32–45)

## 2024-09-01 LAB — POCT I-STAT 7, (LYTES, BLD GAS, ICA,H+H)
Acid-base deficit: 3 mmol/L — ABNORMAL HIGH (ref 0.0–2.0)
Bicarbonate: 21.3 mmol/L (ref 20.0–28.0)
Calcium, Ion: 1.23 mmol/L (ref 1.15–1.40)
HCT: 38 % — ABNORMAL LOW (ref 39.0–52.0)
Hemoglobin: 12.9 g/dL — ABNORMAL LOW (ref 13.0–17.0)
O2 Saturation: 98 %
Potassium: 4 mmol/L (ref 3.5–5.1)
Sodium: 142 mmol/L (ref 135–145)
TCO2: 22 mmol/L (ref 22–32)
pCO2 arterial: 36.5 mmHg (ref 32–48)
pH, Arterial: 7.375 (ref 7.35–7.45)
pO2, Arterial: 103 mmHg (ref 83–108)

## 2024-09-05 ENCOUNTER — Encounter: Payer: Self-pay | Admitting: Internal Medicine

## 2024-09-05 ENCOUNTER — Ambulatory Visit (HOSPITAL_COMMUNITY)
Admission: RE | Admit: 2024-09-05 | Discharge: 2024-09-05 | Disposition: A | Discharge status: Home / Self Care | Source: Ambulatory Visit | Attending: Internal Medicine | Admitting: Internal Medicine

## 2024-09-05 DIAGNOSIS — I350 Nonrheumatic aortic (valve) stenosis: Secondary | ICD-10-CM | POA: Insufficient documentation

## 2024-09-05 MED ORDER — IOHEXOL 350 MG/ML SOLN
100.0000 mL | Freq: Once | INTRAVENOUS | Status: AC | PRN
  Administered 2024-09-05: 100 mL via INTRAVENOUS

## 2024-09-06 ENCOUNTER — Ambulatory Visit: Payer: Self-pay | Admitting: Cardiovascular Disease

## 2024-09-07 NOTE — Progress Notes (Signed)
 Procedure Type: Isolated AVR Perioperative Outcome Estimate % Operative Mortality 4.23% Morbidity & Mortality 16.3% Stroke 1.73% Renal Failure 3.03% Reoperation 5.49% Prolonged Ventilation 12.8% Deep Sternal Wound Infection 0.253% Long Hospital Stay (>14 days) 7.43% [facility] Stay (<6 days)* 24.2%

## 2024-09-14 ENCOUNTER — Ambulatory Visit: Attending: Surgery | Admitting: Surgery

## 2024-09-14 ENCOUNTER — Other Ambulatory Visit: Payer: Self-pay

## 2024-09-14 ENCOUNTER — Other Ambulatory Visit (HOSPITAL_COMMUNITY): Payer: Self-pay

## 2024-09-14 ENCOUNTER — Encounter: Payer: Self-pay | Admitting: *Deleted

## 2024-09-14 ENCOUNTER — Ambulatory Visit: Admitting: Psychology

## 2024-09-14 VITALS — BP 138/73 | HR 77 | Resp 18 | Ht 68.0 in | Wt 232.0 lb

## 2024-09-14 DIAGNOSIS — I350 Nonrheumatic aortic (valve) stenosis: Secondary | ICD-10-CM

## 2024-09-14 MED ORDER — PNEUMOCOCCAL 20-VAL CONJ VACC 0.5 ML IM SUSY
0.5000 mL | PREFILLED_SYRINGE | Freq: Once | INTRAMUSCULAR | 0 refills | Status: AC
  Filled 2024-09-14: qty 0.5, 1d supply, fill #0

## 2024-09-15 ENCOUNTER — Encounter: Payer: Self-pay | Admitting: Surgery

## 2024-09-15 NOTE — Progress Notes (Signed)
 Patient ID: Eric Stokes, male   DOB: [birth year], 79 y.o.   MRN: [id number]  [facility] VALVE CLINIC       [address]       [place] [address]             [phone]          CARDIOTHORACIC SURGERY CONSULTATION REPORT  PCP is Kenney Clotilda PARAS, PA Referring Provider is [name], MD [facility] Cardiologist is Dr. Micky HF Cardiologist is Toribio Fuel, MD  Reason for consultation:  Severe aortic stenosis  HPI:  The patient is a 79 year old gentleman with a history of hyperlipidemia, coronary artery disease status post CABG x 4, MAZE and LAA clipping by Dr. German in 2021, chronic systolic heart failure, PAF, obesity, prostate cancer, and severe aortic stenosis who was referred for consideration of TAVR.  He has been followed in the heart failure clinic by Dr. Fuel.  He had an echo in the [place] system in July 2025 showing a left ventricular ejection fraction of 50 to 55% with moderate to severe aortic stenosis with a mean gradient of 32 mmHg and a peak gradient of 53 mmHg.  Aortic valve area was 1.1 cm with moderate mitral regurgitation.  He was seen in the ED on 07/08/2024 with strokelike symptoms and had an unremarkable head CT and MRI and was felt to have a TIA.  A follow-up echocardiogram on 07/25/2024 showed a left ventricular ejection fraction of 40 to 45% with global hypokinesis.  There is mild to moderate mitral regurgitation.  The mean gradient across the aortic valve was 34 mmHg with a valve area of 0.84 cm and dimensionless index of 0.16.  Stroke-volume index was low at 33.  He continues to have progressive exertional fatigue and shortness of breath.  He denies any dizziness or syncope.  He denies orthopnea and peripheral edema.  He is here today with his daughter.  He lives in [place] with his wife who is elderly with dementia and he provides all of her care.  Past Medical History:   Diagnosis Date   Aortic stenosis, mild    last echo in epic 05-27-2020  ef 40-45%,  mean grandiant , valve area 2.46 cm^2 ,  mild regurg   BPH with obstruction/lower urinary tract symptoms    urologist--- dr nieves   Carotid bruit present 06/15/2018   2015 Carotid Dopplers:  Essentially normal carotid arteries, with very slight hard plaque in the proximal ICA's and serpentine distal vessels. 1-39% bilateral ICA stenosis. Patent vertebral arteries with antegrade flow. Normal subclavian arteries, bilaterally.   Chronic systolic CHF (congestive heart failure) (HCC) 05/2020   followed by dr jaclynn   COPD (chronic obstructive pulmonary disease) (HCC)    Coronary artery disease 04/2020   cardiologist--- dr fuel;   NSTEMI /  Afib w/ RVR/  Acute CHF/  cardiogenic shock 04-20-2020;  cath 05-17-2020 multivessel disease w/ critical LM;   05-18-2020 s/p CABG x4 and Maze procedure;  nuclear stress study done [facility] 09-17-2021 no ischemia, ef 54%   Essential hypertension, benign 12/21/2008   Qualifier: Diagnosis of  By: Lavona, MD, CODY Agent     Glaucoma, both eyes    History of migraine    History of non-ST elevation myocardial infarction (NSTEMI) 05/17/2020   cardiogenic shock,  AF w/ RVR, acute CHF,  ICM;   s/p CABG w/ MAZE   History of squamous cell carcinoma of skin  History of TIA (transient ischemic attack) 02/28/2019   questionable TIA  per neurology note dr rosemarie, 04-12-2019, likely migraine   Hx of colonic polyps 01/09/2020   Hyperlipidemia    Ischemic cardiomyopathy 04/2020   per cath 09-03-20212  ef 25-30%;   last echo in epic 05-27-2020  ef 40-45%   Malignant neoplasm prostate (HCC) 10/2019   urologist--- dr nieves;  dx 03/ 2021  active survillance   Myocardial infarction (HCC)    OA (osteoarthritis)    OSA on CPAP    Diagnosed at the [facility] 2016    PAF (paroxysmal atrial fibrillation) (HCC) 04/2020   followed by cardiology---  s/p MAZE procedure  05-18-2020, takes ASA   PVC's (premature ventricular contractions)    S/P CABG x 4 05/18/2020   LIMA to LAD,  RIMA to PDA, SVG to OM1 and OM2   Seasonal allergic rhinitis    Sinus bradycardia    event monitor 01-29-2017  mild bradycardia, no pauses, rare PVCs   Vitamin B 12 deficiency    Vitamin D  deficiency 11/26/2010   Wears hearing aid in both ears     Past Surgical History:  Procedure Laterality Date   CATARACT EXTRACTION W/ INTRAOCULAR LENS IMPLANT Bilateral    CLIPPING OF ATRIAL APPENDAGE Left 05/18/2020   Procedure: CLIPPING OF ATRIAL APPENDAGE USING ATRICURE 45 MM ATRICLIP;  Surgeon: German Bartlett PEDLAR, MD;  Location: [facility] OR;  Service: Open Heart Surgery;  Laterality: Left;   COLONOSCOPY     per pt last one approx 2019  by dr kristie   CORONARY ARTERY BYPASS GRAFT N/A 05/18/2020   Procedure: CORONARY ARTERY BYPASS GRAFTING (CABG) TIMES FOUR USING INTERNAL BILATERAL MAMMARY ARTERIES AND HARVESTED LEFT RADIAL ARTERY.;  Surgeon: German Bartlett PEDLAR, MD;  Location: [facility] OR;  Service: Open Heart Surgery;  Laterality: N/A;  CORONARY ENDARTERECTOMY PERFORMED DURING SURGERY    CYSTOSCOPY WITH URETHRAL DILATATION N/A 10/28/2022   Procedure: CYSTOSCOPY WITH BLADDER NECK DILATATION;  Surgeon: Nieves Cough, MD;  Location: [facility];  Service: Urology;  Laterality: N/A;  75 MINS   GOLD SEED IMPLANT N/A 08/26/2022   Procedure: GOLD SEED IMPLANT;  Surgeon: Devere Lonni Righter, MD;  Location[facility];  Service: Urology;  Laterality: N/A;   IABP INSERTION N/A 05/17/2020   Procedure: IABP INSERTION;  Surgeon: Cherrie Toribio SAUNDERS, MD;  Location: [facility];  Service: Cardiovascular;  Laterality: N/A;   INSERTION OF SUPRAPUBIC CATHETER N/A 10/10/2022   Procedure: INSERTION OF SUPRAPUBIC CATHETER cystoscopy urethral dilation;  Surgeon: Lovie Arlyss CROME, MD;  Location: [facility];  Service: Urology;  Laterality: N/A;   KNEE ARTHROPLASTY Right 03/14/2020   Procedure: COMPUTER  ASSISTED TOTAL KNEE ARTHROPLASTY;  Surgeon: Fidel Rogue, MD;  Location: [facility];  Service: Orthopedics;  Laterality: Right;   LAPAROSCOPIC CHOLECYSTECTOMY  02/26/2000   @[facility]   by dr merrilyn   MAZE N/A 05/18/2020   Procedure: MAZE USING ATRICURE ISOLATOR CLAMP OLL2;  Surgeon: German Bartlett PEDLAR, MD;  Location: [facility];  Service: Open Heart Surgery;  Laterality: N/A;   RADIAL ARTERY HARVEST Left 05/18/2020   Procedure: RADIAL ARTERY HARVEST;  Surgeon: German Bartlett PEDLAR, MD;  Location: [facility];  Service: Open Heart Surgery;  Laterality: Left;   RIGHT HEART CATH N/A 05/17/2020   Procedure: RIGHT HEART CATH;  Surgeon: Cherrie Toribio SAUNDERS, MD;  [facility];  Service: Cardiovascular;  Laterality: N/A;   RIGHT HEART CATH AND CORONARY/GRAFT ANGIOGRAPHY N/A 08/31/2024   Procedure:  RIGHT HEART CATH AND CORONARY/GRAFT ANGIOGRAPHY;  Surgeon: Verlin Lonni BIRCH, MD;  [facility];  Service: Cardiovascular;  Laterality: N/A;   RIGHT/LEFT HEART CATH AND CORONARY ANGIOGRAPHY N/A 05/17/2020   Procedure: RIGHT/LEFT HEART CATH AND CORONARY ANGIOGRAPHY;  Surgeon: Court Dorn PARAS, MD;  [facility];  Service: Cardiovascular;  Laterality: N/A;   SPACE OAR INSTILLATION N/A 08/26/2022   Procedure: SPACE OAR INSTILLATION;  Surgeon: Devere Lonni Righter, MD;  Location: [facility];  Service: Urology;  Laterality: N/A;  30 MINS FOR CASE   TEE WITHOUT CARDIOVERSION N/A 05/18/2020   Procedure: TRANSESOPHAGEAL ECHOCARDIOGRAM (TEE);  Surgeon: German Bartlett PEDLAR, MD;  Location: [facility] OR;  Service: Open Heart Surgery;  Laterality: N/A;   THULIUM LASER TURP (TRANSURETHRAL RESECTION OF PROSTATE) N/A 04/15/2022   Procedure: JULIE LASER VAPORIZATION OF PROSTATE;  Surgeon: Nieves Cough, MD;  Location: [facility];  Service: Urology;  Laterality: N/A;  90 MINS FOR CASE   TONSILLECTOMY AND ADENOIDECTOMY  1952   TRANSURETHRAL RESECTION OF PROSTATE N/A  04/15/2022   TRANSURETHRAL RESECTION OF PROSTATE  10/28/2022   Procedure: TRANSURETHRAL RESECTION OF THE PROSTATE (TURP);  Surgeon: Nieves Cough, MD;  Location: [facility];  Service: Urology;;   TRANSURETHRAL RESECTION OF PROSTATE N/A 05/19/2023   Procedure: TRANSURETHRAL RESECTION OF THE PROSTATE (TURP);  Surgeon: Nieves Cough, MD;  Location: [facility];  Service: Urology;  Laterality: N/A;   TRANSURETHRAL RESECTION OF PROSTATE N/A 02/23/2024   Procedure: TURP (TRANSURETHRAL RESECTION OF PROSTATE);  Surgeon: Nieves Cough, MD;  Location: [facility];  Service: Urology;  Laterality: N/A;    Family History  Problem Relation Age of Onset   Diabetes Mother    COPD Father    Stroke Paternal Grandfather     Social History   Socioeconomic History   Marital status: Married    Spouse name: Not on file   Number of children: 2   Years of education: 13   Highest education level: Not on file  Occupational History   Occupation: retired   Occupation: Retired holiday representative  Tobacco Use   Smoking status: Former    Current packs/day: 0.00    Types: Cigarettes    Start date: 1964    Quit date: 1967    Years since quitting: 59.1    Passive exposure: Past   Smokeless tobacco: Never  Vaping Use   Vaping status: Never Used  Substance and Sexual Activity   Alcohol  use: Yes    Comment: Occasional beer   Drug use: Never   Sexual activity: Yes  Other Topics Concern   Not on file  Social History Narrative   Is a [place] veteran, has trouble sleeping most nights. Often experiences nightmares w/anesthesia meds.   Social Drivers of Health   Tobacco Use: Medium Risk (09/15/2024)   Patient History    Smoking Tobacco Use: Former    Smokeless Tobacco Use: Never    Passive Exposure: Past  Programmer, Applications: Not on file  Food Insecurity: No Food Insecurity (09/13/2021)   Received from [facility]   Epic    Within the past 12 months, you worried that your food would run out before you  got the money to buy more.: Never true    Within the past 12 months, the food you bought just didn't last and you didn't have money to get more.: Never true  Transportation Needs: Not on file  Physical Activity: Not on file  Stress: Not on file  Social Connections: Not on file  Intimate Partner Violence: Not on file  Depression (PHQ2-9): Not on file  Alcohol  Screen: Not on file  Housing: Not on file  Utilities: Not on file  Health Literacy: Not on file    Prior to Admission medications  Medication Sig Start Date End Date Taking? Authorizing Provider  acetaminophen  (TYLENOL ) 500 MG tablet Take 500-1,000 mg by mouth every 6 (six) hours as needed for moderate pain (pain score 4-6).    [provider]  albuterol  (VENTOLIN  HFA) 108 (90 Base) MCG/ACT inhaler Inhale 1 puff into the lungs as needed for wheezing or shortness of breath. 09/26/15   [provider]  alfuzosin  (UROXATRAL ) 10 MG 24 hr tablet Take 10 mg by mouth at bedtime.    [provider]  apixaban  (ELIQUIS ) 5 MG TABS tablet Take 1 tablet (5 mg total) by mouth 2 (two) times daily. 02/25/24   Nieves Cough, MD  bismuth subsalicylate (PEPTO BISMOL) 262 MG/15ML suspension Take 30 mLs by mouth every 6 (six) hours as needed for indigestion.    [provider]  cetirizine (ZYRTEC) 10 MG tablet Take 10 mg by mouth at bedtime. 01/16/13   [provider]  Cholecalciferol  (VITAMIN D ) 50 MCG (2000 UT) tablet Take 2,000 Units by mouth daily.    [provider]  fluticasone  (FLONASE ) 50 MCG/ACT nasal spray USE TWO SPRAYS IN EACH NOSTRIL DAILY AS NEEDED. 05/02/20   Jodie Lavern CROME, MD  furosemide  (LASIX ) 20 MG tablet Take 1 tablet (20 mg total) by mouth daily as needed. 04/05/24   Milford, Harlene HERO, FNP  ketotifen (ZADITOR) 0.025 % ophthalmic solution Place 1 drop into both eyes daily as needed (allergies). 12/12/20   [provider]  latanoprost  (XALATAN ) 0.005 % ophthalmic solution  INSTILL 1 DROP INTO BOTH EYES AT BEDTIME 08/30/20   Jodie Lavern CROME, MD  metoprolol  succinate (TOPROL -XL) 50 MG 24 hr tablet Take 1 tablet (50 mg total) by mouth daily. Take with or immediately following a meal. Patient taking differently: Take 25 mg by mouth daily. Take with or immediately following a meal. 05/23/24 09/15/24  Bensimhon, Toribio SAUNDERS, MD  montelukast  (SINGULAIR ) 10 MG tablet TAKE 1 TABLET BY MOUTH EVERY DAY AT BEDTIME 05/27/20   Jodie Lavern CROME, MD  nitroGLYCERIN  (NITROSTAT ) 0.4 MG SL tablet Place 1 tablet (0.4 mg total) under the tongue every 5 (five) minutes as needed for chest pain. 05/23/24 09/15/24  Bensimhon, Toribio SAUNDERS, MD  NON FORMULARY Cpap at night    [provider]  pneumococcal 20-valent conjugate vaccine (PREVNAR 20 ) 0.5 ML injection Inject 0.5 mLs into the muscle once for 1 dose. Patient not taking: Reported on 09/15/2024 09/14/24 09/15/24  Luiz Channel, MD  rosuvastatin  (CRESTOR ) 20 MG tablet Take 1 tablet (20 mg total) by mouth daily. 07/31/20   Bensimhon, Toribio SAUNDERS, MD    Current Outpatient Medications  Medication Sig Dispense Refill   acetaminophen  (TYLENOL ) 500 MG tablet Take 500-1,000 mg by mouth every 6 (six) hours as needed for moderate pain (pain score 4-6).     albuterol  (VENTOLIN  HFA) 108 (90 Base) MCG/ACT inhaler Inhale 1 puff into the lungs as needed for wheezing or shortness of breath.     alfuzosin  (UROXATRAL ) 10 MG 24 hr tablet Take 10 mg by mouth at bedtime.     apixaban  (ELIQUIS ) 5 MG TABS tablet Take 1 tablet (5 mg total) by mouth 2 (two) times daily.     bismuth subsalicylate (PEPTO BISMOL) 262 MG/15ML  suspension Take 30 mLs by mouth every 6 (six) hours as needed for indigestion.     cetirizine (ZYRTEC) 10 MG tablet Take 10 mg by mouth at bedtime.     Cholecalciferol  (VITAMIN D ) 50 MCG (2000 UT) tablet Take 2,000 Units by mouth daily.     fluticasone  (FLONASE ) 50 MCG/ACT nasal spray USE TWO SPRAYS IN EACH NOSTRIL DAILY AS NEEDED. 48 mL 0    furosemide  (LASIX ) 20 MG tablet Take 1 tablet (20 mg total) by mouth daily as needed. 30 tablet 3   ketotifen (ZADITOR) 0.025 % ophthalmic solution Place 1 drop into both eyes daily as needed (allergies).     latanoprost  (XALATAN ) 0.005 % ophthalmic solution INSTILL 1 DROP INTO BOTH EYES AT BEDTIME 11.25 mL 1   metoprolol  succinate (TOPROL -XL) 50 MG 24 hr tablet Take 1 tablet (50 mg total) by mouth daily. Take with or immediately following a meal. (Patient taking differently: Take 25 mg by mouth daily. Take with or immediately following a meal.) 90 tablet 3   montelukast  (SINGULAIR ) 10 MG tablet TAKE 1 TABLET BY MOUTH EVERY DAY AT BEDTIME 90 tablet 3   nitroGLYCERIN  (NITROSTAT ) 0.4 MG SL tablet Place 1 tablet (0.4 mg total) under the tongue every 5 (five) minutes as needed for chest pain. 90 tablet 3   NON FORMULARY Cpap at night     pneumococcal 20-valent conjugate vaccine (PREVNAR 20 ) 0.5 ML injection Inject 0.5 mLs into the muscle once for 1 dose. (Patient not taking: Reported on 09/15/2024) 0.5 mL 0   rosuvastatin  (CRESTOR ) 20 MG tablet Take 1 tablet (20 mg total) by mouth daily. 90 tablet 3   No current facility-administered medications for this visit.    Allergies[1]    Review of Systems:   General:  normal appetite, + decreased energy, no weight gain, No weight loss, no fever  Cardiac:  no chest pain with exertion, no chest pain at rest, +SOB with mild exertion, no resting SOB, no PND, no orthopnea, no palpitations, no arrhythmia, + paroxysmal atrial fibrillation, no LE edema, no dizzy spells, no syncope  Respiratory:  + exertional shortness of breath, no home oxygen, no productive cough, no dry cough, no bronchitis, no wheezing, no hemoptysis, no asthma, no pain with inspiration or cough, no sleep apnea, no CPAP at night  GI:   no difficulty swallowing, no reflux, no frequent heartburn, no hiatal hernia, no abdominal pain, no constipation, no diarrhea, no hematochezia, no hematemesis, no  melena  GU:   no dysuria,  no frequency, no urinary tract infection, no hematuria, no enlarged prostate, no kidney stones, no kidney disease  Vascular:  no pain suggestive of claudication, no pain in feet, no leg cramps, no varicose veins, no DVT, no non-healing foot ulcer  Neuro:   no stroke, + TIA's, no seizures, no headaches, no temporary blindness one eye,  no slurred speech, no peripheral neuropathy, no chronic pain, no instability of gait, no memory/cognitive dysfunction  Musculoskeletal: + arthritis, no joint swelling, no myalgias, no difficulty walking, normal mobility   Skin:   no rash, no itching, no skin infections, no pressure sores or ulcerations  Psych:   no anxiety, no depression, no nervousness, no unusual recent stress  Eyes:   no blurry vision, no floaters, no recent vision changes, no glasses or contacts  ENT:   no hearing loss, no loose or painful teeth, no dentures  Hematologic:  no easy bruising, no abnormal bleeding, no clotting disorder, no frequent epistaxis  Endocrine:  no diabetes     Physical Exam:   BP 138/73 (BP Location: Left Arm)   Pulse 77   Resp 18   Ht 5' 8 (1.727 m)   Wt 232 lb (105.2 kg)   SpO2 99%   BMI 35.28 kg/m   General:  Large frame,  well-appearing  HEENT:  Unremarkable, NCAT, PERLA, EOMI  Neck:   no JVD, no bruits, no adenopathy   Chest:   clear to auscultation, symmetrical breath sounds, no wheezes, no rhonchi   CV:   RRR, 3/6 systolic murmur RSB, no diastolic murmur  Abdomen:  soft, non-tender, no masses   Extremities:  warm, well-perfused, pedal pulses palpable, no lower extremity edema  Rectal/GU  Deferred  Neuro:   Grossly non-focal and symmetrical throughout  Skin:   Clean and dry, no rashes, no breakdown  Diagnostic Tests:  ECHOCARDIOGRAM REPORT       Patient Name:   Eric Stokes Date of Exam: 07/25/2024 Medical Rec #:  [id number]          Height:       68.0 in Accession #:    [id number]         Weight:       247.9  lb Date of Birth:  1945-09-16         BSA:          2.239 m Patient Age:    78 years           BP:           130/58 mmHg Patient Gender: M                  HR:           71 bpm. Exam Location:  [address]  Procedure: 2D Echo, Cardiac Doppler and Color Doppler (Both Spectral and Color            Flow Doppler were utilized during procedure).  Indications:    I35.9 AV disorder   History:        Patient has prior history of Echocardiogram examinations, most                 recent 03/01/2024. CAD, Prior CABG, COPD, Aortic Valve Disease                 and AS, Arrythmias:PVC and Paroxysmal atrial fibrillation; Risk                 Factors:Obesity, Hypertension and Dyslipidemia.   Sonographer:    Elsie Bohr RDCS Referring Phys: [id number] CHRISTOPHER D MCALHANY  IMPRESSIONS    1. Left ventricular ejection fraction, by estimation, is 40 to 45%. The left ventricle has mildly decreased function. The left ventricle demonstrates global hypokinesis. The left ventricular internal cavity size was mildly dilated. There is moderate concentric left ventricular hypertrophy. Left ventricular diastolic parameters are indeterminate. There is the interventricular septum is flattened in systole and diastole, consistent with right ventricular pressure and volume overload.  2. Right ventricular systolic function is moderately reduced. The right ventricular size is mildly enlarged.  3. Left atrial size was severely dilated.  4. Right atrial size was moderately dilated.  5. The mitral valve is degenerative. Mild to moderate mitral valve regurgitation. No evidence of mitral stenosis. Moderate mitral annular calcification.  6. Tricuspid valve regurgitation is mild to moderate.  7. The aortic valve is tricuspid. There is severe calcifcation of the aortic valve. Aortic valve regurgitation is trivial.  Moderate to severe aortic valve stenosis. Aortic valve area, by VTI measures 0.84 cm. Aortic valve  mean gradient measures 34.2 mmHg. Aortic valve Vmax measures 3.72 m/s.  8. Aortic dilatation noted. There is borderline dilatation of the ascending aorta, measuring 39 mm.  9. The inferior vena cava is normal in size with greater than 50% respiratory variability, suggesting right atrial pressure of 3 mmHg.  FINDINGS  Left Ventricle: Left ventricular ejection fraction, by estimation, is 40 to 45%. The left ventricle has mildly decreased function. The left ventricle demonstrates global hypokinesis. The left ventricular internal cavity size was mildly dilated. There is  moderate concentric left ventricular hypertrophy. The interventricular septum is flattened in systole and diastole, consistent with right ventricular pressure and volume overload. Left ventricular diastolic parameters are indeterminate.  Right Ventricle: The right ventricular size is mildly enlarged. No increase in right ventricular wall thickness. Right ventricular systolic function is moderately reduced.  Left Atrium: Left atrial size was severely dilated.  Right Atrium: Right atrial size was moderately dilated.  Pericardium: There is no evidence of pericardial effusion.  Mitral Valve: The mitral valve is degenerative in appearance. Moderate mitral annular calcification. Mild to moderate mitral valve regurgitation. No evidence of mitral valve stenosis.  Tricuspid Valve: The tricuspid valve is normal in structure. Tricuspid valve regurgitation is mild to moderate. No evidence of tricuspid stenosis.  Aortic Valve: The aortic valve is tricuspid. There is severe calcifcation of the aortic valve. Aortic valve regurgitation is trivial. Moderate to severe aortic stenosis is present. Aortic valve mean gradient measures 34.2 mmHg. Aortic valve peak gradient  measures 55.3 mmHg. Aortic valve area, by VTI measures 0.84 cm.  Pulmonic Valve: The pulmonic valve was not well visualized. Pulmonic valve regurgitation is mild.  No evidence of pulmonic stenosis.  Aorta: Aortic dilatation noted. There is borderline dilatation of the ascending aorta, measuring 39 mm.  Venous: The inferior vena cava is normal in size with greater than 50% respiratory variability, suggesting right atrial pressure of 3 mmHg.  IAS/Shunts: There is right bowing of the interatrial septum, suggestive of elevated left atrial pressure. No atrial level shunt detected by color flow Doppler.    LEFT VENTRICLE PLAX 2D LVIDd:         6.10 cm LVIDs:         4.50 cm LV PW:         1.40 cm LV IVS:        1.30 cm LVOT diam:     2.60 cm LV SV:         74 LV SV Index:   33 LVOT Area:     5.31 cm    RIGHT VENTRICLE            IVC RVSP:           33.0 mmHg  IVC diam: 1.50 cm  LEFT ATRIUM              Index        RIGHT ATRIUM           Index LA diam:        5.50 cm  2.46 cm/m   RA Pressure: 3.00 mmHg LA Vol (A2C):   100.0 ml 44.66 ml/m  RA Area:     25.30 cm LA Vol (A4C):   99.8 ml  44.57 ml/m  RA Volume:   83.30 ml  37.20 ml/m LA Biplane Vol: 100.0 ml 44.66 ml/m  AORTIC VALVE AV Area (Vmax):  0.92 cm AV Area (Vmean):   0.88 cm AV Area (VTI):     0.84 cm AV Vmax:           371.80 cm/s AV Vmean:          273.600 cm/s AV VTI:            0.884 m AV Peak Grad:      55.3 mmHg AV Mean Grad:      34.2 mmHg LVOT Vmax:         64.52 cm/s LVOT Vmean:        45.320 cm/s LVOT VTI:          0.140 m LVOT/AV VTI ratio: 0.16   AORTA Ao Root diam: 3.50 cm Ao Asc diam:  3.90 cm  MITRAL VALVE               TRICUSPID VALVE MV Area (PHT): 3.11 cm    TR Peak grad:   30.0 mmHg MV Decel Time: 244 msec    TR Vmax:        274.00 cm/s MV E velocity: 94.16 cm/s  Estimated RAP:  3.00 mmHg                            RVSP:           33.0 mmHg                              SHUNTS                            Systemic VTI:  0.14 m                            Systemic Diam: 2.60 cm  Toribio Fuel MD Electronically signed by Toribio Fuel  MD Signature Date/Time: 07/26/2024/12:06:50 AM       Final      Procedures  RIGHT HEART CATH AND CORONARY/GRAFT ANGIOGRAPHY   Conclusion      Ost LM to Mid LM lesion is 99% stenosed.   Ost RCA to Prox RCA lesion is 99% stenosed.   Prox RCA to Mid RCA lesion is 80% stenosed.   RIMA graft was visualized by angiography and is normal in caliber.   LIMA graft was visualized by angiography and is normal in caliber.   Left radial artery graft was visualized by angiography and is normal in caliber.   Severe left main disease Patent LIMA to LAD Patent free radial artery graft to the OM1/OM2 Severe ostial RCA disease with CTO of the mid RCA.  Patent RIMA to distal RCA Mild elevation right heart pressures.    Continue planning for TAVR    ADDENDUM REPORT: 09/10/2024 21:54   EXAM: OVER-READ INTERPRETATION  CT CHEST   The following report is an over-read performed by radiologist Dr. Oneil Devonshire of [facility] [facility] on 09/10/2024. This over-read does not include interpretation of cardiac or coronary anatomy or pathology. The coronary calcium  score/coronary CTA interpretation by the cardiologist is attached.   COMPARISON:  04/24/2022   FINDINGS: Cardiovascular: Atherosclerotic calcifications of the thoracic aorta are noted. No aneurysmal dilatation is seen.   Mediastinum/Nodes: There are no enlarged lymph nodes within the visualized mediastinum.   Lungs/Pleura: There is no pleural effusion. The visualized lungs appear clear.  Upper abdomen: No significant findings in the visualized upper abdomen.   Musculoskeletal/Chest wall: No chest wall mass or suspicious osseous findings within the visualized chest.   IMPRESSION: Aortic Atherosclerosis (ICD10-I70.0).     Electronically Signed   By: Oneil Devonshire M.D.   On: 09/10/2024 21:54    Addended by Devonshire Oneil, MD on 09/10/2024  9:56 PM    Study Result  Narrative & Impression  CLINICAL DATA:  Severe Aortic  Stenosis.   EXAM: Cardiac TAVR CT   TECHNIQUE: A non-contrast, gated CT scan was obtained with axial slices of 2.5 mm through the heart for aortic valve scoring. A 100 kV retrospective, gated, contrast cardiac scan was obtained. Gantry rotation speed was 230 msec and collimation was 0.63 mm. Nitroglycerin  was not given. The 3D dataset was reconstructed in systole with motion correction. The 3D data set was reconstructed in 5% intervals of the 0-95% of the R-R cycle. Systolic and diastolic phases were analyzed on a dedicated workstation using MPR, MIP, and VRT modes. The patient received 100 cc of contrast.   FINDINGS: Image quality: Excellent.   Noise artifact is: Limited.   Valve Morphology: Tricuspid aortic valve with severe calcifications. Severely restricted leaflet movement in systole. Bulky calcification of the NCC.   Aortic Valve Calcium  score: 5002   Aortic annular dimension:   Phase assessed: 36%   Annular area: 608 mm2   Annular perimeter: 88.4 mm   Max diameter: 29.9 mm   Min diameter: 26.6 mm   Annular and subannular calcification: Moderate, single protruding calcification under the LCC extending into the LVOT.   Membranous septum length: 9.4 mm   Optimal coplanar projection: LAO 11 CRA 8   Coronary Artery Height above Annulus:   Left Main: 17.7 mm   Right Coronary: 17.0 mm   Sinus of Valsalva Measurements:   Non-coronary: 32 mm   Right-coronary: 34 mm   Left-coronary: 34 mm   Sinus of Valsalva Height:   Non-coronary: 21.5 mm   Right-coronary: 21.6 mm   Left-coronary: 23.6 mm   Sinotubular Junction: 29 mm   Ascending Thoracic Aorta: 37 mm   Coronary Arteries: Normal coronary origin. Right dominance. The study was performed without use of NTG and is insufficient for plaque evaluation. Please refer to recent cardiac catheterization for coronary assessment. S/p CABG with incomplete graft assessment.   Cardiac Morphology:   Right  Atrium: Right atrial size is dilated.   Right Ventricle: The right ventricular cavity is within normal limits.   Left Atrium: Left atrial size is dilated. The LAA has been surgically clipped.   Left Ventricle: The ventricular cavity size is within normal limits.   Pulmonary arteries: Normal in size without proximal filling defect.   Pulmonary veins: Normal pulmonary venous drainage.   Pericardium: Normal thickness with no significant effusion or calcium  present.   Mitral Valve: The mitral valve is normal structure without significant calcification.   Extra-cardiac findings: See attached radiology report for non-cardiac structures.   IMPRESSION: 1. Annular measurements support a 29 mm S3 or 34 mm Evolut Pro.   2. Moderate, single protruding calcification under the LCC extending into the LVOT.   3. Sufficient coronary to annulus distance.   4. Optimal Fluoroscopic Angle for Delivery: LAO 11 CRA 8 \   5. Biatrial enlargement.  LAA has been surgically clipped.   Darryle T. Barbaraann, MD   Electronically Signed: By: Darryle Barbaraann M.D. On: 09/05/2024 13:00       Narrative & Impression  CLINICAL DATA:  Non rheumatic aortic valve stenosis. Pre-op evaluation for TAVR.   EXAM: CT ANGIOGRAPHY CHEST, ABDOMEN AND PELVIS   TECHNIQUE: Non-contrast CT of the chest was initially obtained.   Multidetector CT imaging through the chest, abdomen and pelvis was performed using the standard protocol during bolus administration of intravenous contrast. Multiplanar reconstructed images and MIPs were obtained and reviewed to evaluate the vascular anatomy.   RADIATION DOSE REDUCTION: This exam was performed according to the departmental dose-optimization program which includes automated exposure control, adjustment of the mA and/or kV according to patient size and/or use of iterative reconstruction technique.   CONTRAST:  OMNIPAQUE  IOHEXOL  350 MG/ML SOLN   COMPARISON:  None  Available.   FINDINGS: CTA CHEST FINDINGS   Cardiovascular: Status post CABG. Left atrial appendage clip. Heart size is enlarged without pericardial effusion. Aortic valve calcifications. Normal caliber of the thoracic aorta. Conventional 3 vessel arch anatomy. Arch vessels are patent. Atherosclerotic calcification involving thoracic aorta.   Mediastinum/Nodes: No significant mediastinal, hilar or axillary lymph node enlargement. Esophagus is unremarkable.   Lungs/Pleura: Trachea and mainstem bronchi are patent. Focal scarring or atelectasis in the anterior right upper lobe on image 32. No focal airspace disease or consolidation in the lungs. 4 mm peripheral nodular density in left lower lobe on image 83, sequence 310 is indeterminate.   Musculoskeletal: Median sternotomy wires. No acute bone abnormality.   Review of the MIP images confirms the above findings.   CTA ABDOMEN AND PELVIS FINDINGS   VASCULAR   Aorta: Atherosclerotic aorta without aneurysm, dissection or significant stenosis.   Celiac: Patent without evidence of aneurysm, dissection, vasculitis or significant stenosis.   SMA: Patent without evidence of aneurysm, dissection, vasculitis or significant stenosis.   Renals: 2 right renal arteries. Probably four left renal arteries. Difficult to evaluate for renal artery stenosis due to the small size of the renal arteries. There is no evidence for aneurysm or dissection involving the renal arteries.   IMA: Patent.  Aortic atherosclerotic plaque near the origin.   Inflow: Iliac arteries are tortuous and the common and external iliac arteries are widely patent with mild atherosclerotic disease. Bilateral internal iliac arteries are patent.   Proximal outflow: Stranding anterior to the right common femoral artery could be related to previous catheterization. No pseudoaneurysm in this area. Proximal femoral arteries are patent bilaterally.   Veins: No obvious  venous abnormality within the limitations of this arterial phase study.   Review of the MIP images confirms the above findings.   NON-VASCULAR   Hepatobiliary: Scattered hypodensities in liver are better characterized on the previous CT from 07/12/2024. Suspect that these represent hepatic cysts. Cholecystectomy. No significant biliary dilatation.   Pancreas: Again noted is diffuse dilatation of the pancreatic duct measuring up to 0.9 cm. Evidence for a small duodenum diverticulum near the ampulla. No inflammatory changes involving the pancreas. No discrete pancreatic lesion.   Spleen: Normal in size without focal abnormality.   Adrenals/Urinary Tract: Normal adrenal glands. Bilateral renal cysts. No hydronephrosis. No suspicious renal lesions. Urinary bladder is decompressed but there is chronic bladder wall thickening. Bladder diverticula including a prominent diverticulum along the bladder dome. Again noted is mild stranding around the bladder.   Stomach/Bowel: No bowel dilatation or obstruction. Duodenal diverticulum near the ampulla. Normal appearance of the stomach.   Lymphatic: Stable 6 mm left external iliac lymph node on image 214, series 309. No significant lymph node enlargement in the abdomen or pelvis.   Reproductive: Post  TURP changes. Evidence for fiducial markers in the prostate.   Other: Small left inguinal hernia containing fat. Negative for free air. No free fluid.   Musculoskeletal: Multilevel disc space narrowing and endplate changes in lumbar spine. No acute bone abnormality.   Review of the MIP images confirms the above findings.   IMPRESSION: 1. Diffuse atherosclerotic disease involving the thoracic and abdominal aorta without aneurysm, dissection or significant stenosis. 2. Post CABG changes. 3. Multiple bilateral renal arteries as described. 4. Bilateral iliac arteries are tortuous but widely patent. 5. No acute abnormality in the chest,  abdomen or pelvis. 6. Indeterminate 4 mm nodular density in left lower lobe. No follow-up needed if patient is low-risk. Non-contrast chest CT can be considered in 12 months if patient is high-risk. This recommendation follows the consensus statement: Guidelines for Management of Incidental Pulmonary Nodules Detected on CT Images: From the Fleischner Society 2017; Radiology 2017; 284:228-243. 7. Diffuse pancreatic duct dilatation. Findings are similar to the exam on 07/12/2024. No discrete pancreatic mass. No acute inflammatory changes. Consider surveillance or further evaluation with MRI/MRCP. 8. Chronic wall thickening and diverticula involving the bladder and suggestive for chronic bladder outlet obstruction.     Electronically Signed   By: Juliene Balder M.D.   On: 09/06/2024 16:44      Impression:  This 79 year old gentleman has stage D, severe, symptomatic aortic stenosis with NYHA class II symptoms of exertional fatigue and shortness of breath consistent with chronic diastolic congestive heart failure.  I have personally reviewed his 2D echocardiogram, cardiac catheterization, and CTA studies.  His echo shows a severely calcified aortic valve with a mean gradient of 34 mmHg and a valve area of 0.84 cm with a dimensionless index of 0.16 and low stroke-volume index of 33 consistent with severe aortic stenosis.  Left ventricular ejection fraction is mildly decreased at 40 to 45% with global hypokinesis.  This has decreased from his previous echocardiogram in July 2025 at the [facility] when his ejection fraction was noted to be 50 to 55%.  Cardiac catheterization shows a patent LIMA to the LAD, patent RIMA to the distal RCA, and patent left radial artery graft to the OM1/OM 2.  I agree that aortic valve replacement is indicated in this patient for relief of his symptoms and to prevent further left ventricular deterioration.  Given his age, comorbidities, and prior coronary bypass surgery I think  transcatheter aortic valve replacement be the best option for treating him.  His gated cardiac CTA shows anatomy suitable for TAVR using a 29 mm SAPIEN 3 valve.  His abdominal and pelvic CTA shows adequate pelvic vascular anatomy to allow transfemoral insertion.  The patient was counseled at length regarding treatment alternatives for management of severe symptomatic aortic stenosis. The risks and benefits of surgical intervention has been discussed in detail. Long-term prognosis with medical therapy was discussed. Alternative approaches such as conventional surgical aortic valve replacement, transcatheter aortic valve replacement, and palliative medical therapy were compared and contrasted at length. This discussion was placed in the context of the patient's own specific clinical presentation and past medical history. All of their questions have been addressed.   Following the decision to proceed with transcatheter aortic valve replacement, a discussion was held regarding what types of management strategies would be attempted intraoperatively in the event of life-threatening complications, including whether or not the patient would be considered a candidate for the use of cardiopulmonary bypass and/or conversion to open sternotomy for attempted surgical intervention.  I do  not think he is a candidate for emergent sternotomy to manage any intraoperative complications given his age and prior coronary bypass surgery.  He is in full agreement with that and said that he would never want to have redo heart surgery.  His daughter was also in full agreement with that.  The patient has been advised of a variety of complications that might develop including but not limited to risks of death, stroke, paravalvular leak, aortic dissection or other major vascular complications, aortic annulus rupture, device embolization, cardiac rupture or perforation, mitral regurgitation, acute myocardial infarction, arrhythmia, heart  block or bradycardia requiring permanent pacemaker placement, congestive heart failure, respiratory failure, renal failure, pneumonia, infection, other late complications related to structural valve deterioration or migration, or other complications that might ultimately cause a temporary or permanent loss of functional independence or other long term morbidity. The patient provides full informed consent for the procedure as described and all questions were answered.      Plan:  He will be scheduled for transfemoral TAVR using a SAPIEN 3 valve on Tuesday, 09/20/2024.   I spent 60 minutes performing this consultation and > 50% of this time was spent face to face counseling and coordinating the care of this patient's severe symptomatic aortic stenosis.   Dorise LOIS Fellers, MD 09/14/2024       [1]  Allergies Allergen Reactions   Flomax  [Tamsulosin ] Other (See Comments)    dizziness   Cipro [Ciprofloxacin Hcl] Swelling    Knee swelling, joint pain   Lipitor [Atorvastatin] Other (See Comments)    Leg Cramps   Lisinopril Cough   Farxiga [Dapagliflozin] Other (See Comments)    Had UTI on Farxiga and Jardiance    Spironolactone  Other (See Comments)    Grew breasts

## 2024-09-16 ENCOUNTER — Ambulatory Visit (HOSPITAL_COMMUNITY)
Admission: RE | Admit: 2024-09-16 | Discharge: 2024-09-16 | Disposition: A | Source: Ambulatory Visit | Attending: Cardiovascular Disease | Admitting: Cardiovascular Disease

## 2024-09-16 ENCOUNTER — Other Ambulatory Visit: Payer: Self-pay

## 2024-09-16 ENCOUNTER — Encounter (HOSPITAL_COMMUNITY)
Admission: RE | Admit: 2024-09-16 | Discharge: 2024-09-16 | Disposition: A | Source: Ambulatory Visit | Attending: Cardiovascular Disease

## 2024-09-16 DIAGNOSIS — I350 Nonrheumatic aortic (valve) stenosis: Secondary | ICD-10-CM | POA: Insufficient documentation

## 2024-09-16 DIAGNOSIS — Z01818 Encounter for other preprocedural examination: Secondary | ICD-10-CM | POA: Insufficient documentation

## 2024-09-16 LAB — URINALYSIS, ROUTINE W REFLEX MICROSCOPIC
Bacteria, UA: NONE SEEN
Bilirubin Urine: NEGATIVE
Glucose, UA: NEGATIVE mg/dL
Ketones, ur: NEGATIVE mg/dL
Leukocytes,Ua: NEGATIVE
Nitrite: NEGATIVE
Protein, ur: 100 mg/dL — AB
Specific Gravity, Urine: 1.026 (ref 1.005–1.030)
pH: 5 (ref 5.0–8.0)

## 2024-09-16 LAB — CBC
HCT: 38.9 % — ABNORMAL LOW (ref 39.0–52.0)
Hemoglobin: 13.3 g/dL (ref 13.0–17.0)
MCH: 30.8 pg (ref 26.0–34.0)
MCHC: 34.2 g/dL (ref 30.0–36.0)
MCV: 90 fL (ref 80.0–100.0)
Platelets: 148 10*3/uL — ABNORMAL LOW (ref 150–400)
RBC: 4.32 MIL/uL (ref 4.22–5.81)
RDW: 13.2 % (ref 11.5–15.5)
WBC: 4 10*3/uL (ref 4.0–10.5)
nRBC: 0 % (ref 0.0–0.2)

## 2024-09-16 LAB — COMPREHENSIVE METABOLIC PANEL WITH GFR
ALT: 19 U/L (ref 0–44)
AST: 21 U/L (ref 15–41)
Albumin: 4.1 g/dL (ref 3.5–5.0)
Alkaline Phosphatase: 59 U/L (ref 38–126)
Anion gap: 10 (ref 5–15)
BUN: 22 mg/dL (ref 8–23)
CO2: 23 mmol/L (ref 22–32)
Calcium: 9 mg/dL (ref 8.9–10.3)
Chloride: 108 mmol/L (ref 98–111)
Creatinine, Ser: 0.85 mg/dL (ref 0.61–1.24)
GFR, Estimated: >60 mL/min
Glucose, Bld: 127 mg/dL — ABNORMAL HIGH (ref 70–99)
Potassium: 3.8 mmol/L (ref 3.5–5.1)
Sodium: 141 mmol/L (ref 135–145)
Total Bilirubin: 0.5 mg/dL (ref 0.0–1.2)
Total Protein: 6.9 g/dL (ref 6.5–8.1)

## 2024-09-16 LAB — TYPE AND SCREEN
ABO/RH(D): O POS
Antibody Screen: NEGATIVE

## 2024-09-16 LAB — SURGICAL PCR SCREEN
MRSA, PCR: NEGATIVE
Staphylococcus aureus: NEGATIVE

## 2024-09-16 LAB — PROTIME-INR
INR: 1.1 (ref 0.8–1.2)
Prothrombin Time: 15 s (ref 11.4–15.2)

## 2024-09-16 NOTE — Progress Notes (Signed)
 All consents signed by patient at PAT lab appointment. Pt was sent home with printed copy of surgical instructions and CHG soap/CHG soap instructions. All instructions reviewed with patient and questions answered.  Patients chart send to anesthesia for review. Pt denies any respiratory illness/infection in the last two months.

## 2024-09-19 MED ORDER — CEFAZOLIN SODIUM-DEXTROSE 2-4 GM/100ML-% IV SOLN
2.0000 g | INTRAVENOUS | Status: AC
  Administered 2024-09-20: 2 g via INTRAVENOUS
  Filled 2024-09-19: qty 100

## 2024-09-19 MED ORDER — NOREPINEPHRINE 4 MG/250ML-% IV SOLN
0.0000 ug/min | INTRAVENOUS | Status: AC
  Administered 2024-09-20: 2 ug/min via INTRAVENOUS
  Filled 2024-09-19: qty 250

## 2024-09-19 MED ORDER — DEXMEDETOMIDINE HCL IN NACL 400 MCG/100ML IV SOLN
0.1000 ug/kg/h | INTRAVENOUS | Status: DC
  Filled 2024-09-19: qty 100

## 2024-09-19 MED ORDER — MAGNESIUM SULFATE 50 % IJ SOLN
40.0000 meq | INTRAMUSCULAR | Status: DC
  Filled 2024-09-19: qty 9.85

## 2024-09-19 MED ORDER — HEPARIN 30,000 UNITS/1000 ML (OHS) CELLSAVER SOLUTION
Status: DC
  Filled 2024-09-19: qty 1000

## 2024-09-19 MED ORDER — POTASSIUM CHLORIDE 2 MEQ/ML IV SOLN
80.0000 meq | INTRAVENOUS | Status: DC
  Filled 2024-09-19: qty 40

## 2024-09-20 ENCOUNTER — Inpatient Hospital Stay (HOSPITAL_COMMUNITY)

## 2024-09-20 ENCOUNTER — Inpatient Hospital Stay (HOSPITAL_COMMUNITY): Admitting: Certified Registered Nurse Anesthetist

## 2024-09-20 ENCOUNTER — Encounter (HOSPITAL_COMMUNITY): Payer: Self-pay | Admitting: Cardiovascular Disease

## 2024-09-20 ENCOUNTER — Encounter (HOSPITAL_COMMUNITY): Admitting: Physician Assistant

## 2024-09-20 ENCOUNTER — Encounter (HOSPITAL_COMMUNITY): Admission: RE | Disposition: A | Payer: Self-pay | Source: Ambulatory Visit | Attending: Cardiovascular Disease

## 2024-09-20 ENCOUNTER — Inpatient Hospital Stay (HOSPITAL_COMMUNITY)
Admission: RE | Admit: 2024-09-20 | Discharge: 2024-09-21 | DRG: 267 | Disposition: A | Source: Ambulatory Visit | Attending: Cardiovascular Disease | Admitting: Cardiovascular Disease

## 2024-09-20 ENCOUNTER — Other Ambulatory Visit: Payer: Self-pay

## 2024-09-20 DIAGNOSIS — E669 Obesity, unspecified: Secondary | ICD-10-CM | POA: Diagnosis present

## 2024-09-20 DIAGNOSIS — I480 Paroxysmal atrial fibrillation: Secondary | ICD-10-CM | POA: Diagnosis present

## 2024-09-20 DIAGNOSIS — J449 Chronic obstructive pulmonary disease, unspecified: Secondary | ICD-10-CM | POA: Diagnosis present

## 2024-09-20 DIAGNOSIS — Z7901 Long term (current) use of anticoagulants: Secondary | ICD-10-CM

## 2024-09-20 DIAGNOSIS — Z825 Family history of asthma and other chronic lower respiratory diseases: Secondary | ICD-10-CM

## 2024-09-20 DIAGNOSIS — N401 Enlarged prostate with lower urinary tract symptoms: Secondary | ICD-10-CM | POA: Diagnosis present

## 2024-09-20 DIAGNOSIS — Z7982 Long term (current) use of aspirin: Secondary | ICD-10-CM

## 2024-09-20 DIAGNOSIS — E162 Hypoglycemia, unspecified: Secondary | ICD-10-CM | POA: Diagnosis not present

## 2024-09-20 DIAGNOSIS — Z833 Family history of diabetes mellitus: Secondary | ICD-10-CM

## 2024-09-20 DIAGNOSIS — I447 Left bundle-branch block, unspecified: Secondary | ICD-10-CM

## 2024-09-20 DIAGNOSIS — Z85828 Personal history of other malignant neoplasm of skin: Secondary | ICD-10-CM

## 2024-09-20 DIAGNOSIS — I2510 Atherosclerotic heart disease of native coronary artery without angina pectoris: Secondary | ICD-10-CM | POA: Diagnosis present

## 2024-09-20 DIAGNOSIS — Z888 Allergy status to other drugs, medicaments and biological substances status: Secondary | ICD-10-CM

## 2024-09-20 DIAGNOSIS — E66811 Obesity, class 1: Secondary | ICD-10-CM | POA: Diagnosis present

## 2024-09-20 DIAGNOSIS — Z96651 Presence of right artificial knee joint: Secondary | ICD-10-CM | POA: Diagnosis present

## 2024-09-20 DIAGNOSIS — C61 Malignant neoplasm of prostate: Secondary | ICD-10-CM | POA: Diagnosis present

## 2024-09-20 DIAGNOSIS — G4733 Obstructive sleep apnea (adult) (pediatric): Secondary | ICD-10-CM | POA: Diagnosis present

## 2024-09-20 DIAGNOSIS — Z79899 Other long term (current) drug therapy: Secondary | ICD-10-CM

## 2024-09-20 DIAGNOSIS — Z951 Presence of aortocoronary bypass graft: Secondary | ICD-10-CM

## 2024-09-20 DIAGNOSIS — I110 Hypertensive heart disease with heart failure: Secondary | ICD-10-CM | POA: Diagnosis present

## 2024-09-20 DIAGNOSIS — H409 Unspecified glaucoma: Secondary | ICD-10-CM | POA: Diagnosis present

## 2024-09-20 DIAGNOSIS — Z952 Presence of prosthetic heart valve: Principal | ICD-10-CM

## 2024-09-20 DIAGNOSIS — Z8546 Personal history of malignant neoplasm of prostate: Secondary | ICD-10-CM

## 2024-09-20 DIAGNOSIS — Z9841 Cataract extraction status, right eye: Secondary | ICD-10-CM

## 2024-09-20 DIAGNOSIS — Z961 Presence of intraocular lens: Secondary | ICD-10-CM | POA: Diagnosis present

## 2024-09-20 DIAGNOSIS — Z9842 Cataract extraction status, left eye: Secondary | ICD-10-CM

## 2024-09-20 DIAGNOSIS — G459 Transient cerebral ischemic attack, unspecified: Secondary | ICD-10-CM | POA: Diagnosis present

## 2024-09-20 DIAGNOSIS — Z8673 Personal history of transient ischemic attack (TIA), and cerebral infarction without residual deficits: Secondary | ICD-10-CM

## 2024-09-20 DIAGNOSIS — N138 Other obstructive and reflux uropathy: Secondary | ICD-10-CM | POA: Diagnosis present

## 2024-09-20 DIAGNOSIS — I441 Atrioventricular block, second degree: Secondary | ICD-10-CM | POA: Diagnosis present

## 2024-09-20 DIAGNOSIS — I10 Essential (primary) hypertension: Secondary | ICD-10-CM | POA: Diagnosis present

## 2024-09-20 DIAGNOSIS — I5022 Chronic systolic (congestive) heart failure: Secondary | ICD-10-CM | POA: Diagnosis present

## 2024-09-20 DIAGNOSIS — Z974 Presence of external hearing-aid: Secondary | ICD-10-CM

## 2024-09-20 DIAGNOSIS — I440 Atrioventricular block, first degree: Secondary | ICD-10-CM

## 2024-09-20 DIAGNOSIS — I350 Nonrheumatic aortic (valve) stenosis: Principal | ICD-10-CM | POA: Diagnosis present

## 2024-09-20 DIAGNOSIS — I255 Ischemic cardiomyopathy: Secondary | ICD-10-CM | POA: Diagnosis present

## 2024-09-20 DIAGNOSIS — E785 Hyperlipidemia, unspecified: Secondary | ICD-10-CM | POA: Diagnosis present

## 2024-09-20 DIAGNOSIS — I252 Old myocardial infarction: Secondary | ICD-10-CM

## 2024-09-20 DIAGNOSIS — Z87891 Personal history of nicotine dependence: Secondary | ICD-10-CM

## 2024-09-20 DIAGNOSIS — Z823 Family history of stroke: Secondary | ICD-10-CM

## 2024-09-20 DIAGNOSIS — Z006 Encounter for examination for normal comparison and control in clinical research program: Secondary | ICD-10-CM

## 2024-09-20 HISTORY — DX: Nonrheumatic aortic (valve) stenosis: I35.0

## 2024-09-20 LAB — ECHOCARDIOGRAM LIMITED
AR max vel: 2.73 cm2
AV Area VTI: 2.72 cm2
AV Area mean vel: 2.73 cm2
AV Mean grad: 2 mmHg
AV Peak grad: 4 mmHg
Ao pk vel: 1 m/s
S' Lateral: 4.9 cm
Single Plane A4C EF: 39.6 %

## 2024-09-20 LAB — GLUCOSE, CAPILLARY: Glucose-Capillary: 89 mg/dL (ref 70–99)

## 2024-09-20 MED ORDER — ROSUVASTATIN CALCIUM 20 MG PO TABS
20.0000 mg | ORAL_TABLET | Freq: Every day | ORAL | Status: DC
  Administered 2024-09-20 – 2024-09-21 (×2): 20 mg via ORAL
  Filled 2024-09-20 (×2): qty 1

## 2024-09-20 MED ORDER — ONDANSETRON HCL 4 MG/2ML IJ SOLN: 4.0000 mg | Freq: Four times a day (QID) | INTRAMUSCULAR | Status: DC | PRN

## 2024-09-20 MED ORDER — SODIUM CHLORIDE 0.9% FLUSH: 3.0000 mL | INTRAVENOUS | Status: DC | PRN

## 2024-09-20 MED ORDER — HEPARIN SODIUM (PORCINE) 1000 UNIT/ML IJ SOLN
INTRAMUSCULAR | Status: DC | PRN
  Administered 2024-09-20: 16000 [IU] via INTRAVENOUS

## 2024-09-20 MED ORDER — SODIUM CHLORIDE 0.9 % IV SOLN: 250.0000 mL | INTRAVENOUS | Status: DC | PRN

## 2024-09-20 MED ORDER — SODIUM CHLORIDE 0.9 % IV SOLN: INTRAVENOUS | Status: DC

## 2024-09-20 MED ORDER — SODIUM CHLORIDE 0.9 % IV SOLN: INTRAVENOUS | Status: AC

## 2024-09-20 MED ORDER — NITROGLYCERIN IN D5W 200-5 MCG/ML-% IV SOLN: 0.0000 ug/min | INTRAVENOUS | Status: DC

## 2024-09-20 MED ORDER — MORPHINE SULFATE (PF) 2 MG/ML IV SOLN: 1.0000 mg | INTRAVENOUS | Status: DC | PRN

## 2024-09-20 MED ORDER — NOREPINEPHRINE 4 MG/250ML-% IV SOLN
0.0000 ug/min | INTRAVENOUS | Status: DC
  Filled 2024-09-20: qty 250

## 2024-09-20 MED ORDER — ALFUZOSIN HCL ER 10 MG PO TB24
10.0000 mg | ORAL_TABLET | Freq: Every day | ORAL | Status: DC
  Administered 2024-09-20: 10 mg via ORAL
  Filled 2024-09-20 (×2): qty 1

## 2024-09-20 MED ORDER — CHLORHEXIDINE GLUCONATE 4 % EX SOLN
30.0000 mL | CUTANEOUS | Status: DC
  Filled 2024-09-20: qty 30

## 2024-09-20 MED ORDER — LACTATED RINGERS IV SOLN: INTRAVENOUS | Status: DC | PRN

## 2024-09-20 MED ORDER — PROTAMINE SULFATE 10 MG/ML IV SOLN
INTRAVENOUS | Status: DC | PRN
  Administered 2024-09-20: 160 mg via INTRAVENOUS

## 2024-09-20 MED ORDER — TRAMADOL HCL 50 MG PO TABS: 50.0000 mg | ORAL_TABLET | ORAL | Status: DC | PRN

## 2024-09-20 MED ORDER — MONTELUKAST SODIUM 10 MG PO TABS
10.0000 mg | ORAL_TABLET | Freq: Every day | ORAL | Status: DC
  Administered 2024-09-20: 10 mg via ORAL
  Filled 2024-09-20: qty 1

## 2024-09-20 MED ORDER — IODIXANOL 320 MG/ML IV SOLN
INTRAVENOUS | Status: DC | PRN
  Administered 2024-09-20: 30 mL

## 2024-09-20 MED ORDER — HEPARIN (PORCINE) IN NACL 1000-0.9 UT/500ML-% IV SOLN
INTRAVENOUS | Status: DC | PRN
  Administered 2024-09-20 (×3): 500 mL

## 2024-09-20 MED ORDER — OXYCODONE HCL 5 MG PO TABS: 5.0000 mg | ORAL_TABLET | ORAL | Status: DC | PRN

## 2024-09-20 MED ORDER — DEXMEDETOMIDINE HCL IN NACL 400 MCG/100ML IV SOLN
INTRAVENOUS | Status: DC | PRN
  Administered 2024-09-20: 52.6 ug via INTRAVENOUS
  Administered 2024-09-20: 1 ug/kg/h via INTRAVENOUS

## 2024-09-20 MED ORDER — LORATADINE 10 MG PO TABS
10.0000 mg | ORAL_TABLET | Freq: Every day | ORAL | Status: DC
  Administered 2024-09-20 – 2024-09-21 (×2): 10 mg via ORAL
  Filled 2024-09-20 (×2): qty 1

## 2024-09-20 MED ORDER — CEFAZOLIN SODIUM-DEXTROSE 2-4 GM/100ML-% IV SOLN
2.0000 g | Freq: Three times a day (TID) | INTRAVENOUS | Status: AC
  Administered 2024-09-20 – 2024-09-21 (×2): 2 g via INTRAVENOUS
  Filled 2024-09-20 (×2): qty 100

## 2024-09-20 MED ORDER — METOPROLOL SUCCINATE ER 25 MG PO TB24
25.0000 mg | ORAL_TABLET | Freq: Every day | ORAL | Status: DC
  Filled 2024-09-20: qty 1

## 2024-09-20 MED ORDER — ACETAMINOPHEN 325 MG PO TABS
650.0000 mg | ORAL_TABLET | Freq: Four times a day (QID) | ORAL | Status: DC | PRN
  Administered 2024-09-21: 650 mg via ORAL
  Filled 2024-09-20: qty 2

## 2024-09-20 MED ORDER — DEXTROSE-SODIUM CHLORIDE 5-0.9 % IV SOLN: INTRAVENOUS | Status: AC

## 2024-09-20 MED ORDER — LIDOCAINE HCL (PF) 1 % IJ SOLN
INTRAMUSCULAR | Status: AC
  Filled 2024-09-20: qty 30

## 2024-09-20 MED ORDER — FENTANYL CITRATE (PF) 100 MCG/2ML IJ SOLN
INTRAMUSCULAR | Status: AC
  Filled 2024-09-20: qty 2

## 2024-09-20 MED ORDER — FENTANYL CITRATE (PF) 100 MCG/2ML IJ SOLN
INTRAMUSCULAR | Status: DC | PRN
  Administered 2024-09-20: 50 ug via INTRAVENOUS

## 2024-09-20 MED ORDER — ACETAMINOPHEN 650 MG RE SUPP: 650.0000 mg | Freq: Four times a day (QID) | RECTAL | Status: DC | PRN

## 2024-09-20 MED ORDER — LIDOCAINE HCL (PF) 1 % IJ SOLN
INTRAMUSCULAR | Status: DC | PRN
  Administered 2024-09-20: 5 mL
  Administered 2024-09-20: 10 mL

## 2024-09-20 MED ORDER — CHLORHEXIDINE GLUCONATE 4 % EX SOLN: 60.0000 mL | Freq: Once | CUTANEOUS | Status: DC

## 2024-09-20 MED ORDER — CHLORHEXIDINE GLUCONATE 0.12 % MT SOLN
15.0000 mL | Freq: Once | OROMUCOSAL | Status: AC
  Administered 2024-09-20: 15 mL via OROMUCOSAL
  Filled 2024-09-20: qty 15

## 2024-09-20 MED ORDER — SODIUM CHLORIDE 0.9% FLUSH
3.0000 mL | Freq: Two times a day (BID) | INTRAVENOUS | Status: DC
  Administered 2024-09-21: 3 mL via INTRAVENOUS

## 2024-09-20 MED ORDER — CHLORHEXIDINE GLUCONATE 4 % EX SOLN
60.0000 mL | Freq: Once | CUTANEOUS | Status: DC
  Filled 2024-09-20: qty 60

## 2024-09-20 MED ORDER — PROPOFOL 500 MG/50ML IV EMUL
INTRAVENOUS | Status: DC | PRN
  Administered 2024-09-20: 25 ug/kg/min via INTRAVENOUS

## 2024-09-20 NOTE — Progress Notes (Signed)
" °  [facility]   MULTIDISCIPLINARY HEART VALVE TEAM  Patient doing well s/p TAVR. She is hemodynamically stable but BP soft. Groin sites stable. ECG with long 1st deg AV block, but no high grade block. Has long history of urinary retention. Okay to in and out cath if needed, but pt would like to avoid this if possible.   Plan to transfer to from cath lab holding to 4E when bed available. Early ambulation after bedrest completed and hopeful discharge over the next 24-48 hours.   Lamarr Hummer PA-C  [facility]  Pager [phone]  "

## 2024-09-20 NOTE — Op Note (Signed)
 " [facility]   MULTIDISCIPLINARY HEART VALVE TEAM   TAVR OPERATIVE NOTE   Date of Procedure:  09/20/2024  Preoperative Diagnosis: Severe Aortic Stenosis   Postoperative Diagnosis: Same   Procedure:   Transcatheter Aortic Valve Replacement - Percutaneous Right Transfemoral Approach  Edwards Sapien 3 Ultra Resilia THV (size 29 mm, model # 9755RSL, serial # [id number])   Co-Surgeons:  Dorise LOIS Fellers, MD and [name], MD   Anesthesiologist:  C. Leopoldo, MD  Echocardiographer:  SHAUNNA. Delford, MD  Pre-operative Echo Findings: Severe aortic stenosis Mild left ventricular systolic dysfunction  Post-operative Echo Findings: No paravalvular leak Mild left ventricular systolic dysfunction   BRIEF CLINICAL NOTE AND INDICATIONS FOR SURGERY  This 79 year old gentleman has stage D, severe, symptomatic aortic stenosis with NYHA class II symptoms of exertional fatigue and shortness of breath consistent with chronic diastolic congestive heart failure.  I have personally reviewed his 2D echocardiogram, cardiac catheterization, and CTA studies.  His echo shows a severely calcified aortic valve with a mean gradient of 34 mmHg and a valve area of 0.84 cm with a dimensionless index of 0.16 and low stroke-volume index of 33 consistent with severe aortic stenosis.  Left ventricular ejection fraction is mildly decreased at 40 to 45% with global hypokinesis.  This has decreased from his previous echocardiogram in July 2025 at the [facility] when his ejection fraction was noted to be 50 to 55%.  Cardiac catheterization shows a patent LIMA to the LAD, patent RIMA to the distal RCA, and patent left radial artery graft to the OM1/OM 2.  I agree that aortic valve replacement is indicated in this patient for relief of his symptoms and to prevent further left ventricular deterioration.  Given his age, comorbidities, and prior coronary bypass surgery I think transcatheter aortic valve replacement be the  best option for treating him.  His gated cardiac CTA shows anatomy suitable for TAVR using a 29 mm SAPIEN 3 valve.  His abdominal and pelvic CTA shows adequate pelvic vascular anatomy to allow transfemoral insertion.   The patient was counseled at length regarding treatment alternatives for management of severe symptomatic aortic stenosis. The risks and benefits of surgical intervention has been discussed in detail. Long-term prognosis with medical therapy was discussed. Alternative approaches such as conventional surgical aortic valve replacement, transcatheter aortic valve replacement, and palliative medical therapy were compared and contrasted at length. This discussion was placed in the context of the patient's own specific clinical presentation and past medical history. All of their questions have been addressed.    Following the decision to proceed with transcatheter aortic valve replacement, a discussion was held regarding what types of management strategies would be attempted intraoperatively in the event of life-threatening complications, including whether or not the patient would be considered a candidate for the use of cardiopulmonary bypass and/or conversion to open sternotomy for attempted surgical intervention.  I do not think he is a candidate for emergent sternotomy to manage any intraoperative complications given his age and prior coronary bypass surgery.  He is in full agreement with that and said that he would never want to have redo heart surgery.  His daughter was also in full agreement with that.  The patient has been advised of a variety of complications that might develop including but not limited to risks of death, stroke, paravalvular leak, aortic dissection or other major vascular complications, aortic annulus rupture, device embolization, cardiac rupture or perforation, mitral regurgitation, acute myocardial infarction, arrhythmia, heart block or  bradycardia requiring permanent  pacemaker placement, congestive heart failure, respiratory failure, renal failure, pneumonia, infection, other late complications related to structural valve deterioration or migration, or other complications that might ultimately cause a temporary or permanent loss of functional independence or other long term morbidity. The patient provides full informed consent for the procedure as described and all questions were answered.        DETAILS OF THE OPERATIVE PROCEDURE  PREPARATION:    The patient was brought to the operating room on the above mentioned date and appropriate monitoring was established by the anesthesia team. The patient was placed in the supine position on the operating table.  Intravenous antibiotics were administered. The patient was monitored closely throughout the procedure under conscious sedation.  General endotracheal anesthesia was induced uneventfully.  Baseline transthoracic echocardiogram was performed. The patient's abdomen and both groins were prepped and draped in a sterile manner. A time out procedure was performed.   PERIPHERAL ACCESS:    Using the modified Seldinger technique, femoral arterial and venous access was obtained with placement of 6 Fr sheaths on the left side.  A pigtail diagnostic catheter was passed through the left arterial sheath under fluoroscopic guidance into the aortic root.  A temporary transvenous pacemaker catheter was passed through the left femoral venous sheath under fluoroscopic guidance into the right ventricle.  The pacemaker was tested to ensure stable lead placement and pacemaker capture. Aortic root angiography was performed in order to determine the optimal angiographic angle for valve deployment.   TRANSFEMORAL ACCESS:   Percutaneous transfemoral access and sheath placement was performed using ultrasound guidance.  The right common femoral artery was cannulated using a micropuncture needle and appropriate location was verified  using hand injection angiogram.  A pair of Abbott Perclose percutaneous closure devices were placed and a 6 French sheath replaced into the femoral artery.  The patient was heparinized systemically and ACT verified > 250 seconds.    A 16 Fr transfemoral E-sheath was introduced into the right common femoral artery after progressively dilating over an Amplatz superstiff wire. An AL-2 catheter was used to direct a straight-tip exchange length wire across the native aortic valve into the left ventricle. This was exchanged out for a pigtail catheter and position was confirmed in the LV apex. Simultaneous LV and Ao pressures were recorded.  The pigtail catheter was exchanged for a Safari wire in the LV apex.   BALLOON AORTIC VALVULOPLASTY:   Not performed.    TRANSCATHETER HEART VALVE DEPLOYMENT:   An Edwards Sapien 3 Ultra transcatheter heart valve (size 29 mm) was prepared and crimped per manufacturer's guidelines, and the proper orientation of the valve is confirmed on the [employer] delivery system. The valve was advanced through the introducer sheath using normal technique until in an appropriate position in the abdominal aorta beyond the sheath tip. The balloon was then retracted and using the fine-tuning wheel was centered on the valve. The valve was then advanced across the aortic arch using appropriate flexion of the catheter. The valve was carefully positioned across the aortic valve annulus. The Commander catheter was retracted using normal technique. Once final position of the valve has been confirmed by angiographic assessment, the valve is deployed during rapid ventricular pacing to maintain systolic blood pressure < 50 mmHg and pulse pressure < 10 mmHg. The balloon inflation is held for >3 seconds after reaching full deployment volume. Once the balloon has fully deflated the balloon is retracted into the ascending aorta and valve function  is assessed using echocardiography. There is felt  to be no paravalvular leak and no central aortic insufficiency.  The patient's hemodynamic recovery following valve deployment is good.  The deployment balloon and guidewire are both removed.    PROCEDURE COMPLETION:   The sheath was removed and femoral artery closure performed.  Protamine  was administered once femoral arterial repair was complete. The temporary pacemaker, pigtail catheter and femoral sheaths were removed with manual pressure used for venous hemostasis.  A Mynx femoral closure device was utilized following removal of the diagnostic sheath in the left femoral artery.  The patient tolerated the procedure well and is transported to the cath lab recovery area in stable condition. There were no immediate intraoperative complications. All sponge instrument and needle counts are verified correct at completion of the operation.   No blood products were administered during the operation.  The patient received a total of 30 mL of intravenous contrast during the procedure.   Dorise MARLA Fellers, MD 09/20/2024       "

## 2024-09-20 NOTE — Discharge Instructions (Signed)

## 2024-09-20 NOTE — Progress Notes (Signed)
 Patient arrived from cath lab holding VSS alert and oriented x 4 CCMD notifed, CHG bath completed, Bed in lowest position with call light in reach current plan of care in progress

## 2024-09-20 NOTE — Progress Notes (Signed)
" °  Echocardiogram 2D Echocardiogram has been performed.  Jerman Tinnon 09/20/2024, 12:33 PM "

## 2024-09-20 NOTE — Progress Notes (Signed)
" °  [facility]   MULTIDISCIPLINARY HEART VALVE TEAM  Called to bedside due to periods of bradycardia into 30s and 40s as well as new onset mental confusion. Initial post op ecg showed sinus with long 1st deg AV block (PR ). Tele shows sinus with intermittent mobitz type I and type II with pauses/bradycardia as well as periods of afib. He has a history of PAF and Eliquis  is on hold given TAVR. Had a possible TIA in 06/2024. Neuro exam with no motor deficits or aphagia but pt not oriented to time and date. States he feels very confused and dizzy. Code stroke called. Will transfer to ICU for higher acuity care.   Eric Hummer PA-C  [facility]  Pager [phone]  "

## 2024-09-20 NOTE — Progress Notes (Signed)
"   This nurse in room to do Q 15 minute groin checks per protoco. This nurse assessed new saturation of dressing. Pressure held per order charge nurse, MD/Pa notified of new onset bleeding and  pressure held for 10 minutes on assessment from the Pa patient had new onset dizziness and confusion. Code stroke called and rapid response notified of change in condition. Pa at bedside to evaluate patient and agreed with this nurse about the change and new onset dysrhythmia.  1624: patient currently in Ct for code stroke 1556 last known well time. New orders to transfer to ICU pending results of Code stroke CT. Charge and rapid response RN accompanied patient to CT  "

## 2024-09-20 NOTE — Anesthesia Procedure Notes (Signed)
 Procedure Name: MAC Date/Time: 09/20/2024 11:34 AM  Performed by: Lamar Lucie DASEN, CRNAPre-anesthesia Checklist: Patient identified, Emergency Drugs available, Suction available and Patient being monitored Patient Re-evaluated:Patient Re-evaluated prior to induction Oxygen Delivery Method: Simple face mask Dental Injury: Teeth and Oropharynx as per pre-operative assessment

## 2024-09-20 NOTE — Code Documentation (Signed)
 Code stroke called on Eric Stokes for acute confusion and dizziness. LKW 1556. He was resting after TAVR procedure. He then became dizzy and confused, and upset. BP soft, (101/82), bradycardic. CBG WNL. Pt previously on Eliquis , last dose taken Wednesday of last week. Intra-operative heparin  given.  Stroke team to pt's bedside. NIHSS 0. Pt states he is confused, but he is able to answer orientation questions and provide history. No deficits seen. Pt taken for stat CT head. Per Dr. Jerri, CT negative for acute hemorrhage. Pt to 2 H for closer monitoring. Bedside handoff with 2 H RN. Pt is not eligible for TNK due to NIHSS 0, recent anticoagulation. Not candidate for IR due to no LVO symptoms.  No acute treatment/TIA alert: q2h x 12 hours NIHSS & VS, then q4h  NPO until stroke swallow screen passed or cleared by SLP.  Richardson Said RN Stroke response

## 2024-09-21 ENCOUNTER — Inpatient Hospital Stay (HOSPITAL_COMMUNITY)
Admission: RE | Admit: 2024-09-21 | Discharge: 2024-09-21 | Disposition: A | Source: Ambulatory Visit | Attending: Physician Assistant | Admitting: Cardiovascular Disease

## 2024-09-21 ENCOUNTER — Inpatient Hospital Stay (HOSPITAL_COMMUNITY)

## 2024-09-21 ENCOUNTER — Encounter (HOSPITAL_COMMUNITY): Payer: Self-pay

## 2024-09-21 ENCOUNTER — Encounter (HOSPITAL_COMMUNITY): Payer: Self-pay | Admitting: *Deleted

## 2024-09-21 DIAGNOSIS — Z952 Presence of prosthetic heart valve: Secondary | ICD-10-CM

## 2024-09-21 DIAGNOSIS — I440 Atrioventricular block, first degree: Secondary | ICD-10-CM

## 2024-09-21 LAB — ECHOCARDIOGRAM COMPLETE
AR max vel: 3.76 cm2
AV Area VTI: 3.77 cm2
AV Area mean vel: 3.78 cm2
AV Mean grad: 8 mmHg
AV Peak grad: 17.5 mmHg
Ao pk vel: 2.09 m/s
Area-P 1/2: 3.85 cm2
Calc EF: 50.7 %
Height: 69 in
MV M vel: 4.44 m/s
MV Peak grad: 78.9 mmHg
S' Lateral: 4.6 cm
Single Plane A2C EF: 49.7 %
Single Plane A4C EF: 52.6 %
Weight: 3739.2 [oz_av]

## 2024-09-21 LAB — BASIC METABOLIC PANEL WITH GFR
Anion gap: 9 (ref 5–15)
BUN: 15 mg/dL (ref 8–23)
CO2: 22 mmol/L (ref 22–32)
Calcium: 8.3 mg/dL — ABNORMAL LOW (ref 8.9–10.3)
Chloride: 105 mmol/L (ref 98–111)
Creatinine, Ser: 0.69 mg/dL (ref 0.61–1.24)
GFR, Estimated: >60 mL/min
Glucose, Bld: 112 mg/dL — ABNORMAL HIGH (ref 70–99)
Potassium: 3.9 mmol/L (ref 3.5–5.1)
Sodium: 136 mmol/L (ref 135–145)

## 2024-09-21 LAB — CBC
HCT: 36.4 % — ABNORMAL LOW (ref 39.0–52.0)
Hemoglobin: 12.5 g/dL — ABNORMAL LOW (ref 13.0–17.0)
MCH: 31.3 pg (ref 26.0–34.0)
MCHC: 34.3 g/dL (ref 30.0–36.0)
MCV: 91 fL (ref 80.0–100.0)
Platelets: 115 10*3/uL — ABNORMAL LOW (ref 150–400)
RBC: 4 MIL/uL — ABNORMAL LOW (ref 4.22–5.81)
RDW: 13.1 % (ref 11.5–15.5)
WBC: 4.3 10*3/uL (ref 4.0–10.5)
nRBC: 0 % (ref 0.0–0.2)

## 2024-09-21 MED ORDER — CHLORHEXIDINE GLUCONATE CLOTH 2 % EX PADS
6.0000 | MEDICATED_PAD | Freq: Every day | CUTANEOUS | Status: DC
  Administered 2024-09-21: 6 via TOPICAL

## 2024-09-21 NOTE — TOC Transition Note (Signed)
 Transition of Care [facility]) - Discharge Note   Patient Details  Name: Eric Stokes MRN: [id number] Date of Birth: 03/06/1946  [facility] CM/SW Contact:  Tom-Johnson, Harvest Muskrat, RN Phone Number: 09/21/2024, 12:03 PM   Clinical Narrative:     Patient is scheduled for discharge today.  Readmission Risk Assessment done. Zio patch placed, info and instructions on AVS. Outpatient Cardiac rehab referral, hospital f/u and discharge instructions on AVS. No ICM needs or recommendations noted. Daughter, Dorothyann at bedside and will transport at discharge.  No further ICM needs noted.      Final next level of care: Home/Self Care Barriers to Discharge: Barriers Resolved   Patient Goals and CMS Choice Patient states their goals for this hospitalization and ongoing recovery are:: To return home CMS Medicare.gov Compare Post Acute Care list provided to:: Patient Choice offered to / list presented to : NA      Discharge Placement                Patient to be transferred to facility by: Daughter Name of family member notified: Dorothyann    Discharge Plan and Services Additional resources added to the After Visit Summary for                  DME Arranged: N/A DME Agency: NA       HH Arranged: NA HH Agency: NA        Social Drivers of Health (SDOH) Interventions SDOH Screenings   Food Insecurity: No Food Insecurity (09/20/2024)  Housing: Low Risk (09/20/2024)  Transportation Needs: No Transportation Needs (09/20/2024)  Utilities: Not At Risk (09/20/2024)  Social Connections: Moderately Isolated (09/20/2024)  Tobacco Use: Medium Risk (09/20/2024)     Readmission Risk Interventions    09/21/2024   12:01 PM  Readmission Risk Prevention Plan  Transportation Screening Complete  PCP or Specialist Appt within 5-7 Days Complete  Home Care Screening Complete  Medication Review (RN CM) Referral to Pharmacy

## 2024-09-21 NOTE — Progress Notes (Signed)
" °  Echocardiogram 2D Echocardiogram has been performed.  Koleen KANDICE Popper, RDCS 09/21/2024, 8:26 AM "

## 2024-09-21 NOTE — Progress Notes (Signed)
 CARDIAC REHAB PHASE I   Post TAVR education including site care, restrictions, risk factors, heart healthy diet, exercise guidelines, and CRP2 reviewed. Patient eager to participate in CRP2. Will refer to [facility]. All questions and concerns addressed.    [phone] Isaiah JAYSON Liverpool, RN BSN 09/21/2024 10:40 AM

## 2024-09-21 NOTE — Procedures (Signed)
 Patient Name: Eric Stokes  MRN: [id number]  Epilepsy Attending: Arlin MALVA Krebs  Referring Physician/Provider: everitt Clint Abbey Earle FORBES, NP  Date: 09/21/2024 Duration: 23.41 mins  Patient history: 79yo M with ams. EEG to evaluate for seizure  Level of alertness: Awake  AEDs during EEG study: None  Technical aspects: This EEG study was done with scalp electrodes positioned according to the 10-20 International system of electrode placement. Electrical activity was reviewed with band pass filter of 1-70Hz , sensitivity of 7 uV/mm, display speed of 64mm/sec with a 60Hz  notched filter applied as appropriate. EEG data were recorded continuously and digitally stored.  Video monitoring was available and reviewed as appropriate.  Description: The posterior dominant rhythm consists of 8Hz  activity of moderate voltage (25-35 uV) seen predominantly in posterior head regions, symmetric and reactive to eye opening and eye closing. Hyperventilation and photic stimulation were not performed.     IMPRESSION: This study is within normal limits. No seizures or epileptiform discharges were seen throughout the recording.  A normal interictal EEG does not exclude the diagnosis of epilepsy.   Afifa Truax O Bonny Egger

## 2024-09-21 NOTE — Progress Notes (Addendum)
 STROKE TEAM PROGRESS NOTE    SIGNIFICANT HOSPITAL EVENTS 2/3: Patient underwent TAVR and postoperatively had an episode of lightheadedness, confusion and woozy feeling, code stroke called  INTERIM HISTORY/SUBJECTIVE Patient is seen in his room with his daughter at the bedside.  He has been hemodynamically stable and afebrile overnight.  He reports having a slight episode of confusion overnight as well, but it was minor in comparison to the one that happened postoperatively yesterday.  Patient's daughter states that patient was completely alert and oriented prior to the confusional episode yesterday and states that he has had 2 similar episodes in the past.  He has an appointment with an outpatient neurologist to discuss possible complicated migraines.  Will order EEG to rule out subclinical seizure activity causing intermittent confusion.  OBJECTIVE  CBC    Component Value Date/Time   WBC 4.3 09/21/2024 0226   RBC 4.00 (L) 09/21/2024 0226   HGB 12.5 (L) 09/21/2024 0226   HGB 13.2 08/23/2024 1245   HCT 36.4 (L) 09/21/2024 0226   HCT 39.7 08/23/2024 1245   PLT 115 (L) 09/21/2024 0226   PLT 151 08/23/2024 1245   MCV 91.0 09/21/2024 0226   MCV 95 08/23/2024 1245   MCH 31.3 09/21/2024 0226   MCHC 34.3 09/21/2024 0226   RDW 13.1 09/21/2024 0226   RDW 13.3 08/23/2024 1245   LYMPHSABS 1.1 07/08/2024 1625   MONOABS 0.4 07/08/2024 1625   EOSABS 0.1 07/08/2024 1625   BASOSABS 0.0 07/08/2024 1625    BMET    Component Value Date/Time   NA 136 09/21/2024 0226   NA 140 08/23/2024 1245   K 3.9 09/21/2024 0226   CL 105 09/21/2024 0226   CO2 22 09/21/2024 0226   GLUCOSE 112 (H) 09/21/2024 0226   BUN 15 09/21/2024 0226   BUN 16 08/23/2024 1245   CREATININE 0.69 09/21/2024 0226   CALCIUM  8.3 (L) 09/21/2024 0226   EGFR 82 08/23/2024 1245   GFRNONAA >60 09/21/2024 0226    IMAGING past 24 hours EEG adult Result Date: 09/21/2024 Shelton Arlin KIDD, MD     09/21/2024 11:49 AM Patient Name:  Eric Stokes MRN: [id number] Epilepsy Attending: Arlin KIDD Shelton Referring Physician/Provider: everitt Clint Abbey Earle FORBES, NP Date: 09/21/2024 Duration: 23.41 mins Patient history: 79yo M with ams. EEG to evaluate for seizure Level of alertness: Awake AEDs during EEG study: None Technical aspects: This EEG study was done with scalp electrodes positioned according to the 10-20 International system of electrode placement. Electrical activity was reviewed with band pass filter of 1-70Hz , sensitivity of 7 uV/mm, display speed of 14mm/sec with a 60Hz  notched filter applied as appropriate. EEG data were recorded continuously and digitally stored.  Video monitoring was available and reviewed as appropriate. Description: The posterior dominant rhythm consists of 8Hz  activity of moderate voltage (25-35 uV) seen predominantly in posterior head regions, symmetric and reactive to eye opening and eye closing. Hyperventilation and photic stimulation were not performed.   IMPRESSION: This study is within normal limits. No seizures or epileptiform discharges were seen throughout the recording. A normal interictal EEG does not exclude the diagnosis of epilepsy. Arlin KIDD Shelton   MR BRAIN WO CONTRAST Result Date: 09/20/2024 EXAM: MRI BRAIN WITHOUT CONTRAST 09/20/2024 11:24:35 PM TECHNIQUE: Multiplanar multisequence MRI of the head/brain was performed without the administration of intravenous contrast. COMPARISON: 07/08/2024 CLINICAL HISTORY: Stroke, follow up. FINDINGS: BRAIN AND VENTRICLES: No acute infarct. No intracranial hemorrhage. No mass. No midline shift. No hydrocephalus. The sella is unremarkable.  Normal flow voids. ORBITS: No significant abnormality. SINUSES AND MASTOIDS: No significant abnormality. BONES AND SOFT TISSUES: Normal marrow signal. No soft tissue abnormality. IMPRESSION: 1. Normal aging brain Electronically signed by: Franky Stanford MD 09/20/2024 11:47 PM EST RP Workstation: [id number]   CT HEAD CODE STROKE  WO CONTRAST Result Date: 09/20/2024 EXAM: CT HEAD WITHOUT CONTRAST 09/20/2024 04:19:28 PM TECHNIQUE: CT of the head was performed without the administration of intravenous contrast. Automated exposure control, iterative reconstruction, and/or weight based adjustment of the mA/kV was utilized to reduce the radiation dose to as low as reasonably achievable. COMPARISON: 07/08/2024 CLINICAL HISTORY: Neuro deficit, acute, stroke suspected. Acute neurologic deficit; stroke suspected. FINDINGS: BRAIN AND VENTRICLES: No acute hemorrhage. No evidence of acute infarct. No hydrocephalus. No extra-axial collection. No mass effect or midline shift. Prominent atherosclerosis of the carotid siphons and intracranial vertebral arteries. [place] Stroke Program Early CT (ASPECT) score: Ganglionic (caudate, IC, lentiform nucleus, insula, M1-M3): 7. Supraganglionic (M4-M6): 3. Total: 10. ORBITS: Bilateral lens replacement. SINUSES: Mild mucosal thickening in the paranasal sinuses. SOFT TISSUES AND SKULL: No acute soft tissue abnormality. No skull fracture. IMPRESSION: 1. No acute intracranial abnormality. 2. ASPECTS is 10. 3. Findings messaged to Dr. Jerri via the [facility] messaging system at 4:28 PM on 09/20/24. Electronically signed by: Donnice Mania MD 09/20/2024 04:28 PM EST RP Workstation: [id number]   ECHOCARDIOGRAM LIMITED Result Date: 09/20/2024    ECHOCARDIOGRAM LIMITED REPORT   Patient Name:   Eric Stokes Date of Exam: 09/20/2024 Medical Rec #:  [id number]          Height:       69.0 in Accession #:    [id number]         Weight:       232.0 lb Date of Birth:  October 14, 1945         BSA:          2.201 m Patient Age:    79 years           BP:           127/81 mmHg Patient Gender: M                  HR:           71 bpm. Exam Location:  Inpatient Procedure: Limited Echo (Both Spectral and Color Flow Doppler were utilized            during procedure). Indications:     aortic stenosis. TAVR.  History:         Patient has prior history  of Echocardiogram examinations, most                  recent 07/25/2024. CHF, Prior CABG, TIA and COPD, Arrythmias:PVC                  and Paroxysmal A-fib; Risk Factors:Dyslipidemia, Hypertension                  and Sleep Apnea.  Sonographer:     Tinnie Barefoot RDCS Referring Phys:  [id number] LONNI JONETTA CASH Diagnosing Phys: Maude Emmer MD IMPRESSIONS  1. Septal apical, distal anterior and inferior basal hypokinesis . Left ventricular ejection fraction, by estimation, is 35 to 40%. The left ventricle has moderately decreased function. The left ventricle demonstrates regional wall motion abnormalities (see scoring diagram/findings for description). The left ventricular internal cavity size was moderately dilated.  2. Left atrial size was moderately dilated.  3. The mitral valve is  abnormal. Mild to moderate mitral valve regurgitation.  4. Pre TAVR: calcified tri leaflet AV mean gradient 26 peak 43 mmHg AVA 0.82 cm2     Post TAVR: well positioned 29 mm Sapien 3 valve no PVL mean gradient 2 peak 4 mmHg with AVA 3.0 cm2 . The aortic valve has been repaired/replaced.  5. Aortic dilatation noted. There is mild dilatation of the ascending aorta, measuring 40 mm. FINDINGS  Left Ventricle: Septal apical, distal anterior and inferior basal hypokinesis. Left ventricular ejection fraction, by estimation, is 35 to 40%. The left ventricle has moderately decreased function. The left ventricle demonstrates regional wall motion abnormalities. The left ventricular internal cavity size was moderately dilated. Left Atrium: Left atrial size was moderately dilated. Pericardium: There is no evidence of pericardial effusion. Mitral Valve: The mitral valve is abnormal. There is mild thickening of the mitral valve leaflet(s). Mild to moderate mitral valve regurgitation. Tricuspid Valve: Tricuspid valve regurgitation is mild. Aortic Valve: Pre TAVR: calcified tri leaflet AV mean gradient 26 peak 43 mmHg AVA 0.82 cm2 Post TAVR: well  positioned 29 mm Sapien 3 valve no PVL mean gradient 2 peak 4 mmHg with AVA 3.0 cm2. The aortic valve has been repaired/replaced. Aortic valve mean gradient measures 2.0 mmHg. Aortic valve peak gradient measures 4.0 mmHg. Aortic valve area, by VTI measures 2.72 cm. Pulmonic Valve: Pulmonic valve regurgitation is mild. Aorta: Aortic dilatation noted. There is mild dilatation of the ascending aorta, measuring 40 mm. Additional Comments: Spectral Doppler performed. Color Doppler performed.  LEFT VENTRICLE PLAX 2D LVIDd:         6.10 cm LVIDs:         4.90 cm LV PW:         1.00 cm LV IVS:        1.20 cm LVOT diam:     2.20 cm LV SV:         60 LV SV Index:   27 LVOT Area:     3.80 cm  LV Volumes (MOD) LV vol d, MOD A4C: 150.0 ml LV vol s, MOD A4C: 90.6 ml LV SV MOD A4C:     150.0 ml LEFT ATRIUM         Index LA diam:    4.90 cm 2.23 cm/m  AORTIC VALVE AV Area (Vmax):    2.73 cm AV Area (Vmean):   2.73 cm AV Area (VTI):     2.72 cm AV Vmax:           99.60 cm/s AV Vmean:          65.800 cm/s AV VTI:            0.221 m AV Peak Grad:      4.0 mmHg AV Mean Grad:      2.0 mmHg LVOT Vmax:         71.65 cm/s LVOT Vmean:        47.300 cm/s LVOT VTI:          0.158 m LVOT/AV VTI ratio: 0.71  AORTA Ao Root diam: 3.20 cm Ao Asc diam:  4.00 cm TRICUSPID VALVE TR Peak grad:   29.8 mmHg TR Vmax:        273.00 cm/s  SHUNTS Systemic VTI:  0.16 m Systemic Diam: 2.20 cm Maude Emmer MD Electronically signed by Maude Emmer MD Signature Date/Time: 09/20/2024/12:34:32 PM    Final     Vitals:   09/21/24 0900 09/21/24 0931 09/21/24 1000 09/21/24 1100  BP: 101/60  95/62 98/61  Pulse: 71  69 71  Resp: 19  17 19   Temp:  98.4 F (36.9 C)    TempSrc:  Oral    SpO2: 96%  95% 98%  Weight:      Height:         PHYSICAL EXAM General:  Alert, well-nourished, well-developed patient in no acute distress Psych:  Mood and affect appropriate for situation CV: Regular rate and rhythm on monitor Respiratory:  Regular, unlabored  respirations on room air GI: Abdomen soft and nontender   NEURO:  Mental Status: AA&Ox3, patient is able to give clear and coherent history Speech/Language: speech is without dysarthria or aphasia.    Cranial Nerves:  II: PERRL. Visual fields full.  III, IV, VI: EOMI. Eyelids elevate symmetrically.  V: Sensation is intact to light touch and symmetrical to face.  VII: Face is symmetrical resting and smiling VIII: hearing intact to voice. IX, X: Phonation is normal.  KP:Dynloizm shrug 5/5. XII: tongue is midline without fasciculations. Motor: 5/5 strength to all muscle groups tested.  Tone: is normal and bulk is normal Sensation- Intact to light touch bilaterally, but diminished in bilateral lower extremities about halfway down the calves to the feet Coordination: FTN intact bilaterally Gait- deferred  Most Recent NIH 0  ASSESSMENT/PLAN  [name] is a 79 y.o. male with history of BPH, CHF, COPD, CAD status post CABG, hypertension, hyperlipidemia, migraines with possible complicated migraines, ischemic cardiomyopathy, glaucoma, A-fib with Eliquis  on hold, sleep apnea on CPAP, TIA, vitamin B12 and vitamin D  D deficiency, severe aortic stenosis who was admitted for TAVR.  Postoperatively, he seemed to be alert and oriented but then had a sudden episode of lightheadedness, woozy feeling and confusion with bradycardia.  Symptoms have completely resolved, and MRI was negative for stroke.  He has had several similar episodes in the past, and plans to see an outpatient neurologist for workup for complicated migraines.  Will obtain EEG to rule out subclinical seizure activity causing intermittent altered mental status.  NIH on Admission 0  Medication effect post TAVR vs complicated migraine  Pt confusion episode 2/3 post TAVR, no HA but intermittent bradycardia and afib with relative hypoglycemia without eating for almost a day Again episode 2/4 with flashing lights on the right eye  and slight pain behind eyes.  Code Stroke CT head No acute abnormality. ASPECTS 10.    MRI no acute abnormality 2D Echo EF 35 to 40% EEG with no seizures or epileptiform discharges VTE prophylaxis -SCDs Eliquis  (apixaban ) daily was on hold for TAVR prior to admission, may resume when safe from a cardiac surgery perspective Therapy recommendations:  Pending Disposition: Pending  Atrial fibrillation Home Meds: Eliquis  5 mg twice daily, metoprolol  XL 25 mg daily Continue telemetry monitoring Eliquis  was on hold for TAVR Resume Eliquis  when safe from cardiac surgery perspective  Complicated migraine Migraine with aura Hx of migraine and migraine aura Denies frequent episodes This morning episode with flashing lights on the right eye and slight pain behind eyes. Had similar episode of HA, word finding difficulty and R weakness on 02/28/19. Stroke work up all negative escept CTA showed moderate distal b/l VA stenosis. EF 60-65%. LDL 104 and A1C 5.6. considered migraine equivalent at that time. Discharged with ASA. Was told TIA  Follow up with Dr. Skeet at [facility] on 11/01/24  Hypertension Home meds: None Stable Maintain normotension  Hyperlipidemia Home meds: crestor  20 Continue statin at discharge  Other Stroke Risk Factors  Obesity, Body mass index is 34.51 kg/m., BMI >/= 30 associated with increased stroke risk, recommend weight loss, diet and exercise as appropriate  Coronary artery disease Congestive heart failure Obstructive sleep apnea, on CPAP at home  Other Active Problems Aortic stenosis status post TAVR-postoperative care per cardiothoracic surgery team  Hospital day # 1  Patient seen by NP and then by MD, MD to edit note as needed. Cortney E Everitt Clint Kill , MSN, AGACNP-BC [facility] See Amion for schedule and pager information 09/21/2024 12:20 PM   ATTENDING NOTE: I reviewed above note and agree with the assessment and plan. Pt was seen and examined.    Wife at the bedside. Pt ready to be discharged. Told me that he had another episode this morning with right eye flashing lights and slight HA with some confusion. But resolved shortly. Pt has similar episodes before and was told complicated migraine and  will follow with Dr. Skeet at [facility]. MRI negative. Continue eliquis  and statin. Continue follow up with Dr. Skeet.  For detailed assessment and plan, please refer to above as I have made changes wherever appropriate.   Neurology will sign off. Please call with questions. Pt will follow up with Dr. Skeet at [facility] on 11/01/24. Thanks for the consult.   Ary Cummins, MD PhD Stroke Neurology 09/21/2024 2:01 PM    To contact Stroke Continuity provider, please refer to [email or web address]. After hours, contact General Neurology

## 2024-09-21 NOTE — Progress Notes (Signed)
ZIO AT applied at hospital Dr. McAlhany to read. 

## 2024-09-21 NOTE — Progress Notes (Signed)
 EEG complete - results pending

## 2024-09-22 ENCOUNTER — Telehealth: Payer: Self-pay

## 2024-09-22 LAB — POCT ACTIVATED CLOTTING TIME
Activated Clotting Time: 291 s
Activated Clotting Time: 143 s

## 2024-09-22 NOTE — Telephone Encounter (Signed)
 Patient daughter contacted regarding discharge from [facility] on 09/21/2024  Patient understands to follow up with provider Izetta Hummer, PA-C on 09/29/2024 at 3:10PM at [address] Location. Patient understands discharge instructions? Yes Patient understands medications and regiment? Yes Patient understands to bring all medications to this visit? Yes

## 2024-09-23 ENCOUNTER — Telehealth (HOSPITAL_COMMUNITY): Payer: Self-pay

## 2024-09-23 NOTE — Telephone Encounter (Signed)
 Pt daughter stated that pt would like to use his [facility] for cardiac rehab and stated that she will call and get an authorization sent to our office.

## 2024-09-23 NOTE — Telephone Encounter (Signed)
 Called patient to see if he is interested in the Cardiac Rehab Program. Patient expressed interest. Explained scheduling process and went over insurance, patient verbalized understanding. Will contact patient for scheduling once f/u has been completed.

## 2024-09-27 ENCOUNTER — Ambulatory Visit: Admitting: Neurology

## 2024-09-29 ENCOUNTER — Ambulatory Visit: Admitting: Physician Assistant

## 2024-10-05 ENCOUNTER — Ambulatory Visit: Admitting: Psychology

## 2024-10-31 ENCOUNTER — Ambulatory Visit: Admitting: Physician Assistant

## 2024-10-31 ENCOUNTER — Ambulatory Visit (HOSPITAL_COMMUNITY)

## 2024-11-01 ENCOUNTER — Ambulatory Visit: Admitting: Neurology

## 2024-11-02 ENCOUNTER — Ambulatory Visit: Admitting: Psychology
